# Patient Record
Sex: Female | Born: 1965 | Race: White | Hispanic: No | Smoking: Former smoker
Health system: Southern US, Community
[De-identification: ages and names within clinical notes are randomized; demographics above are authoritative.]

## PROBLEM LIST (undated history)

## (undated) DIAGNOSIS — M797 Fibromyalgia: Secondary | ICD-10-CM

## (undated) DIAGNOSIS — R519 Headache, unspecified: Secondary | ICD-10-CM

## (undated) DIAGNOSIS — K859 Acute pancreatitis without necrosis or infection, unspecified: Secondary | ICD-10-CM

## (undated) DIAGNOSIS — R0600 Dyspnea, unspecified: Secondary | ICD-10-CM

## (undated) DIAGNOSIS — K224 Dyskinesia of esophagus: Secondary | ICD-10-CM

## (undated) DIAGNOSIS — Z9889 Other specified postprocedural states: Secondary | ICD-10-CM

## (undated) DIAGNOSIS — R000 Tachycardia, unspecified: Secondary | ICD-10-CM

## (undated) DIAGNOSIS — G610 Guillain-Barre syndrome: Secondary | ICD-10-CM

## (undated) DIAGNOSIS — E119 Type 2 diabetes mellitus without complications: Secondary | ICD-10-CM

## (undated) DIAGNOSIS — R112 Nausea with vomiting, unspecified: Secondary | ICD-10-CM

## (undated) DIAGNOSIS — J398 Other specified diseases of upper respiratory tract: Secondary | ICD-10-CM

## (undated) DIAGNOSIS — G4730 Sleep apnea, unspecified: Secondary | ICD-10-CM

## (undated) DIAGNOSIS — T7840XA Allergy, unspecified, initial encounter: Secondary | ICD-10-CM

## (undated) DIAGNOSIS — R911 Solitary pulmonary nodule: Secondary | ICD-10-CM

## (undated) DIAGNOSIS — K219 Gastro-esophageal reflux disease without esophagitis: Secondary | ICD-10-CM

## (undated) DIAGNOSIS — E785 Hyperlipidemia, unspecified: Secondary | ICD-10-CM

## (undated) DIAGNOSIS — I10 Essential (primary) hypertension: Secondary | ICD-10-CM

## (undated) DIAGNOSIS — E039 Hypothyroidism, unspecified: Secondary | ICD-10-CM

## (undated) DIAGNOSIS — J449 Chronic obstructive pulmonary disease, unspecified: Secondary | ICD-10-CM

## (undated) DIAGNOSIS — G2581 Restless legs syndrome: Secondary | ICD-10-CM

## (undated) DIAGNOSIS — J45909 Unspecified asthma, uncomplicated: Secondary | ICD-10-CM

## (undated) DIAGNOSIS — Q8501 Neurofibromatosis, type 1: Secondary | ICD-10-CM

## (undated) HISTORY — DX: Gastro-esophageal reflux disease without esophagitis: K21.9

## (undated) HISTORY — DX: Neurofibromatosis, type 1: Q85.01

## (undated) HISTORY — DX: Essential (primary) hypertension: I10

## (undated) HISTORY — DX: Allergy, unspecified, initial encounter: T78.40XA

## (undated) HISTORY — PX: THYROID SURGERY: SHX805

## (undated) HISTORY — DX: Hyperlipidemia, unspecified: E78.5

## (undated) HISTORY — PX: ABDOMINAL HYSTERECTOMY: SHX81

## (undated) HISTORY — DX: Dyskinesia of esophagus: K22.4

## (undated) HISTORY — DX: Hypothyroidism, unspecified: E03.9

## (undated) HISTORY — PX: COLON SURGERY: SHX602

## (undated) HISTORY — PX: CHOLECYSTECTOMY: SHX55

## (undated) HISTORY — PX: APPENDECTOMY: SHX54

## (undated) HISTORY — PX: TUBAL LIGATION: SHX77

## (undated) HISTORY — PX: SMALL INTESTINE SURGERY: SHX150

## (undated) HISTORY — PX: FOOT SURGERY: SHX648

---
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
## 1998-04-23 DIAGNOSIS — G43909 Migraine, unspecified, not intractable, without status migrainosus: Secondary | ICD-10-CM | POA: Insufficient documentation

## 2000-08-21 ENCOUNTER — Ambulatory Visit (HOSPITAL_COMMUNITY): Admission: RE | Admit: 2000-08-21 | Discharge: 2000-08-21 | Payer: Self-pay | Admitting: Internal Medicine

## 2000-08-21 ENCOUNTER — Encounter: Payer: Self-pay | Admitting: Internal Medicine

## 2002-04-23 DIAGNOSIS — F1120 Opioid dependence, uncomplicated: Secondary | ICD-10-CM | POA: Insufficient documentation

## 2002-05-01 ENCOUNTER — Encounter: Admission: RE | Admit: 2002-05-01 | Discharge: 2002-07-30 | Payer: Self-pay

## 2002-11-12 ENCOUNTER — Encounter
Admission: RE | Admit: 2002-11-12 | Discharge: 2003-02-10 | Payer: Self-pay | Admitting: Physical Medicine & Rehabilitation

## 2003-02-25 ENCOUNTER — Encounter: Admission: RE | Admit: 2003-02-25 | Discharge: 2003-05-26 | Payer: Self-pay | Admitting: Chiropractic Medicine

## 2003-03-04 ENCOUNTER — Ambulatory Visit (HOSPITAL_COMMUNITY)
Admission: RE | Admit: 2003-03-04 | Discharge: 2003-03-04 | Payer: Self-pay | Admitting: Physical Medicine & Rehabilitation

## 2004-05-10 ENCOUNTER — Ambulatory Visit: Payer: Self-pay | Admitting: Family Medicine

## 2004-08-03 ENCOUNTER — Inpatient Hospital Stay: Payer: Self-pay | Admitting: Internal Medicine

## 2004-09-14 ENCOUNTER — Ambulatory Visit: Payer: Self-pay | Admitting: Internal Medicine

## 2005-02-26 ENCOUNTER — Encounter: Admission: RE | Admit: 2005-02-26 | Discharge: 2005-02-26 | Payer: Self-pay | Admitting: Family Medicine

## 2005-08-01 ENCOUNTER — Ambulatory Visit: Payer: Self-pay | Admitting: Unknown Physician Specialty

## 2005-11-06 DIAGNOSIS — E162 Hypoglycemia, unspecified: Secondary | ICD-10-CM | POA: Insufficient documentation

## 2005-12-26 ENCOUNTER — Emergency Department: Payer: Self-pay | Admitting: Emergency Medicine

## 2006-06-07 ENCOUNTER — Ambulatory Visit: Payer: Self-pay | Admitting: Family Medicine

## 2006-11-27 ENCOUNTER — Ambulatory Visit: Payer: Self-pay | Admitting: Family Medicine

## 2006-11-27 DIAGNOSIS — R1013 Epigastric pain: Secondary | ICD-10-CM | POA: Insufficient documentation

## 2006-12-31 ENCOUNTER — Ambulatory Visit: Payer: Self-pay | Admitting: Unknown Physician Specialty

## 2007-03-17 DIAGNOSIS — M545 Low back pain, unspecified: Secondary | ICD-10-CM | POA: Insufficient documentation

## 2007-06-03 DIAGNOSIS — R071 Chest pain on breathing: Secondary | ICD-10-CM | POA: Insufficient documentation

## 2007-08-01 DIAGNOSIS — H00039 Abscess of eyelid unspecified eye, unspecified eyelid: Secondary | ICD-10-CM | POA: Insufficient documentation

## 2007-08-06 ENCOUNTER — Ambulatory Visit: Payer: Self-pay | Admitting: Family Medicine

## 2008-01-01 ENCOUNTER — Ambulatory Visit: Payer: Self-pay | Admitting: Unknown Physician Specialty

## 2008-05-16 ENCOUNTER — Emergency Department: Payer: Self-pay | Admitting: Emergency Medicine

## 2008-06-03 ENCOUNTER — Ambulatory Visit: Payer: Self-pay | Admitting: Specialist

## 2008-06-23 ENCOUNTER — Ambulatory Visit: Payer: Self-pay | Admitting: Specialist

## 2008-10-09 DIAGNOSIS — F430 Acute stress reaction: Secondary | ICD-10-CM | POA: Insufficient documentation

## 2009-02-07 ENCOUNTER — Ambulatory Visit: Payer: Self-pay | Admitting: Specialist

## 2009-02-07 DIAGNOSIS — G4700 Insomnia, unspecified: Secondary | ICD-10-CM | POA: Insufficient documentation

## 2009-03-22 ENCOUNTER — Ambulatory Visit: Payer: Self-pay | Admitting: Unknown Physician Specialty

## 2009-05-10 DIAGNOSIS — R000 Tachycardia, unspecified: Secondary | ICD-10-CM | POA: Insufficient documentation

## 2009-05-27 DIAGNOSIS — D34 Benign neoplasm of thyroid gland: Secondary | ICD-10-CM | POA: Insufficient documentation

## 2009-06-03 ENCOUNTER — Emergency Department: Payer: Self-pay | Admitting: Internal Medicine

## 2009-06-30 DIAGNOSIS — M2550 Pain in unspecified joint: Secondary | ICD-10-CM | POA: Insufficient documentation

## 2009-07-12 DIAGNOSIS — F32A Depression, unspecified: Secondary | ICD-10-CM | POA: Insufficient documentation

## 2009-10-10 DIAGNOSIS — S335XXA Sprain of ligaments of lumbar spine, initial encounter: Secondary | ICD-10-CM | POA: Insufficient documentation

## 2009-11-21 DIAGNOSIS — N952 Postmenopausal atrophic vaginitis: Secondary | ICD-10-CM | POA: Insufficient documentation

## 2009-11-24 DIAGNOSIS — R059 Cough, unspecified: Secondary | ICD-10-CM | POA: Insufficient documentation

## 2009-11-24 DIAGNOSIS — R053 Chronic cough: Secondary | ICD-10-CM | POA: Insufficient documentation

## 2010-01-23 DIAGNOSIS — R1110 Vomiting, unspecified: Secondary | ICD-10-CM | POA: Insufficient documentation

## 2010-02-21 ENCOUNTER — Ambulatory Visit: Payer: Self-pay | Admitting: General Surgery

## 2010-04-23 HISTORY — PX: BREAST BIOPSY: SHX20

## 2010-05-01 ENCOUNTER — Ambulatory Visit: Payer: Self-pay | Admitting: Internal Medicine

## 2010-05-29 ENCOUNTER — Ambulatory Visit: Payer: Self-pay | Admitting: Family Medicine

## 2010-06-28 ENCOUNTER — Ambulatory Visit: Payer: Self-pay

## 2010-07-13 ENCOUNTER — Ambulatory Visit: Payer: Self-pay | Admitting: Surgery

## 2010-07-17 ENCOUNTER — Ambulatory Visit: Payer: Self-pay | Admitting: Surgery

## 2010-07-18 LAB — PATHOLOGY REPORT

## 2010-08-12 ENCOUNTER — Emergency Department: Payer: Self-pay | Admitting: Emergency Medicine

## 2010-08-17 DIAGNOSIS — R091 Pleurisy: Secondary | ICD-10-CM | POA: Insufficient documentation

## 2010-09-25 ENCOUNTER — Ambulatory Visit: Payer: Self-pay | Admitting: Family Medicine

## 2011-06-07 ENCOUNTER — Ambulatory Visit: Payer: Self-pay | Admitting: Pain Medicine

## 2011-07-09 ENCOUNTER — Ambulatory Visit: Payer: Self-pay | Admitting: Pain Medicine

## 2011-07-19 ENCOUNTER — Ambulatory Visit: Payer: Self-pay | Admitting: Pain Medicine

## 2011-07-25 ENCOUNTER — Ambulatory Visit: Payer: Self-pay | Admitting: Pain Medicine

## 2011-08-28 ENCOUNTER — Ambulatory Visit: Payer: Self-pay | Admitting: Pain Medicine

## 2011-09-05 ENCOUNTER — Ambulatory Visit: Payer: Self-pay | Admitting: Pain Medicine

## 2011-09-27 ENCOUNTER — Ambulatory Visit: Payer: Self-pay | Admitting: Pain Medicine

## 2011-10-02 ENCOUNTER — Ambulatory Visit: Payer: Self-pay | Admitting: Family Medicine

## 2011-10-04 ENCOUNTER — Ambulatory Visit: Payer: Self-pay | Admitting: Family Medicine

## 2011-10-08 ENCOUNTER — Ambulatory Visit: Payer: Self-pay | Admitting: Pain Medicine

## 2011-11-13 ENCOUNTER — Ambulatory Visit: Payer: Self-pay | Admitting: Pain Medicine

## 2012-01-01 ENCOUNTER — Ambulatory Visit: Payer: Self-pay | Admitting: Pain Medicine

## 2012-01-09 ENCOUNTER — Ambulatory Visit: Payer: Self-pay | Admitting: Pain Medicine

## 2012-01-29 ENCOUNTER — Ambulatory Visit: Payer: Self-pay | Admitting: Pain Medicine

## 2012-02-13 ENCOUNTER — Ambulatory Visit: Payer: Self-pay | Admitting: Pain Medicine

## 2012-02-13 ENCOUNTER — Ambulatory Visit: Payer: Self-pay | Admitting: Family Medicine

## 2012-02-26 ENCOUNTER — Ambulatory Visit: Payer: Self-pay | Admitting: Pain Medicine

## 2012-03-10 ENCOUNTER — Other Ambulatory Visit: Payer: Self-pay | Admitting: Family Medicine

## 2012-03-10 LAB — CBC WITH DIFFERENTIAL/PLATELET
Basophil #: 0.1 10*3/uL (ref 0.0–0.1)
Basophil %: 0.7 %
Eosinophil #: 0.1 10*3/uL (ref 0.0–0.7)
Eosinophil %: 0.5 %
HCT: 48.8 % — ABNORMAL HIGH (ref 35.0–47.0)
HGB: 16.9 g/dL — ABNORMAL HIGH (ref 12.0–16.0)
Lymphocyte #: 1.8 10*3/uL (ref 1.0–3.6)
Lymphocyte %: 17.6 %
MCH: 29 pg (ref 26.0–34.0)
MCHC: 34.6 g/dL (ref 32.0–36.0)
MCV: 84 fL (ref 80–100)
Monocyte #: 0.9 x10 3/mm (ref 0.2–0.9)
Monocyte %: 9 %
Neutrophil #: 7.3 10*3/uL — ABNORMAL HIGH (ref 1.4–6.5)
Neutrophil %: 72.2 %
Platelet: 363 10*3/uL (ref 150–440)
RBC: 5.82 10*6/uL — ABNORMAL HIGH (ref 3.80–5.20)
RDW: 13.7 % (ref 11.5–14.5)
WBC: 10.1 10*3/uL (ref 3.6–11.0)

## 2012-03-10 LAB — COMPREHENSIVE METABOLIC PANEL
Albumin: 4.3 g/dL (ref 3.4–5.0)
Alkaline Phosphatase: 107 U/L (ref 50–136)
Anion Gap: 9 (ref 7–16)
BUN: 15 mg/dL (ref 7–18)
Bilirubin,Total: 0.7 mg/dL (ref 0.2–1.0)
Calcium, Total: 10 mg/dL (ref 8.5–10.1)
Chloride: 97 mmol/L — ABNORMAL LOW (ref 98–107)
Co2: 28 mmol/L (ref 21–32)
Creatinine: 0.91 mg/dL (ref 0.60–1.30)
EGFR (African American): >60
EGFR (Non-African Amer.): >60
Glucose: 98 mg/dL (ref 65–99)
Osmolality: 269 (ref 275–301)
Potassium: 3.5 mmol/L (ref 3.5–5.1)
SGOT(AST): 16 U/L (ref 15–37)
SGPT (ALT): 21 U/L (ref 12–78)
Sodium: 134 mmol/L — ABNORMAL LOW (ref 136–145)
Total Protein: 8.4 g/dL — ABNORMAL HIGH (ref 6.4–8.2)

## 2012-03-24 ENCOUNTER — Ambulatory Visit: Payer: Self-pay | Admitting: Pain Medicine

## 2012-05-08 ENCOUNTER — Ambulatory Visit: Payer: Self-pay | Admitting: Pain Medicine

## 2012-05-15 ENCOUNTER — Other Ambulatory Visit: Payer: Self-pay | Admitting: Family Medicine

## 2012-05-15 LAB — CBC WITH DIFFERENTIAL/PLATELET
Basophil #: 0.1 10*3/uL (ref 0.0–0.1)
Basophil %: 0.9 %
Eosinophil #: 0.1 10*3/uL (ref 0.0–0.7)
Eosinophil %: 0.8 %
HCT: 46.1 % (ref 35.0–47.0)
HGB: 15.7 g/dL (ref 12.0–16.0)
Lymphocyte #: 1.1 10*3/uL (ref 1.0–3.6)
Lymphocyte %: 11.2 %
MCH: 28.9 pg (ref 26.0–34.0)
MCHC: 34.1 g/dL (ref 32.0–36.0)
MCV: 85 fL (ref 80–100)
Monocyte #: 0.9 x10 3/mm (ref 0.2–0.9)
Monocyte %: 8.8 %
Neutrophil #: 8 10*3/uL — ABNORMAL HIGH (ref 1.4–6.5)
Neutrophil %: 78.3 %
Platelet: 450 10*3/uL — ABNORMAL HIGH (ref 150–440)
RBC: 5.45 10*6/uL — ABNORMAL HIGH (ref 3.80–5.20)
RDW: 14.4 % (ref 11.5–14.5)
WBC: 10.2 10*3/uL (ref 3.6–11.0)

## 2012-05-15 LAB — COMPREHENSIVE METABOLIC PANEL
Albumin: 3.9 g/dL (ref 3.4–5.0)
Alkaline Phosphatase: 151 U/L — ABNORMAL HIGH (ref 50–136)
Anion Gap: 10 (ref 7–16)
BUN: 25 mg/dL — ABNORMAL HIGH (ref 7–18)
Bilirubin,Total: 0.5 mg/dL (ref 0.2–1.0)
Calcium, Total: 9.5 mg/dL (ref 8.5–10.1)
Chloride: 96 mmol/L — ABNORMAL LOW (ref 98–107)
Co2: 27 mmol/L (ref 21–32)
Creatinine: 0.82 mg/dL (ref 0.60–1.30)
EGFR (African American): >60
EGFR (Non-African Amer.): >60
Glucose: 100 mg/dL — ABNORMAL HIGH (ref 65–99)
Osmolality: 271 (ref 275–301)
Potassium: 3.3 mmol/L — ABNORMAL LOW (ref 3.5–5.1)
SGOT(AST): 15 U/L (ref 15–37)
SGPT (ALT): 25 U/L (ref 12–78)
Sodium: 133 mmol/L — ABNORMAL LOW (ref 136–145)
Total Protein: 8.8 g/dL — ABNORMAL HIGH (ref 6.4–8.2)

## 2012-05-15 LAB — LIPASE, BLOOD: Lipase: 84 U/L (ref 73–393)

## 2012-05-28 ENCOUNTER — Ambulatory Visit: Payer: Self-pay | Admitting: Pain Medicine

## 2012-06-03 ENCOUNTER — Ambulatory Visit: Payer: Self-pay | Admitting: Pain Medicine

## 2012-07-02 ENCOUNTER — Ambulatory Visit: Payer: Self-pay | Admitting: Pain Medicine

## 2012-07-21 ENCOUNTER — Ambulatory Visit: Payer: Self-pay | Admitting: Pain Medicine

## 2012-07-24 ENCOUNTER — Ambulatory Visit: Payer: Self-pay | Admitting: Pain Medicine

## 2012-07-30 ENCOUNTER — Ambulatory Visit: Payer: Self-pay | Admitting: Pain Medicine

## 2012-08-12 ENCOUNTER — Ambulatory Visit: Payer: Self-pay | Admitting: Pain Medicine

## 2012-08-27 ENCOUNTER — Ambulatory Visit: Payer: Self-pay | Admitting: Pain Medicine

## 2012-09-11 ENCOUNTER — Ambulatory Visit: Payer: Self-pay | Admitting: Pain Medicine

## 2012-10-07 ENCOUNTER — Ambulatory Visit: Payer: Self-pay | Admitting: Pain Medicine

## 2012-10-17 ENCOUNTER — Ambulatory Visit: Payer: Self-pay | Admitting: Family Medicine

## 2012-11-06 ENCOUNTER — Ambulatory Visit: Payer: Self-pay | Admitting: Pain Medicine

## 2013-03-01 LAB — URINALYSIS, COMPLETE
Bacteria: NONE SEEN
Bilirubin,UR: NEGATIVE
Blood: NEGATIVE
Glucose,UR: NEGATIVE mg/dL (ref 0–75)
Hyaline Cast: 3
Ketone: NEGATIVE
Nitrite: NEGATIVE
Ph: 5 (ref 4.5–8.0)
Protein: NEGATIVE
RBC,UR: 4 /HPF (ref 0–5)
Specific Gravity: 1.025 (ref 1.003–1.030)
Squamous Epithelial: 1
WBC UR: 19 /HPF (ref 0–5)

## 2013-03-01 LAB — CBC
HCT: 42.9 % (ref 35.0–47.0)
HGB: 14.8 g/dL (ref 12.0–16.0)
MCH: 28.7 pg (ref 26.0–34.0)
MCHC: 34.5 g/dL (ref 32.0–36.0)
MCV: 83 fL (ref 80–100)
Platelet: 281 10*3/uL (ref 150–440)
RBC: 5.15 10*6/uL (ref 3.80–5.20)
RDW: 15.1 % — ABNORMAL HIGH (ref 11.5–14.5)
WBC: 9.9 10*3/uL (ref 3.6–11.0)

## 2013-03-01 LAB — COMPREHENSIVE METABOLIC PANEL
Albumin: 3.9 g/dL (ref 3.4–5.0)
Alkaline Phosphatase: 112 U/L (ref 50–136)
Anion Gap: 7 (ref 7–16)
BUN: 23 mg/dL — ABNORMAL HIGH (ref 7–18)
Bilirubin,Total: 0.3 mg/dL (ref 0.2–1.0)
Calcium, Total: 9.2 mg/dL (ref 8.5–10.1)
Chloride: 110 mmol/L — ABNORMAL HIGH (ref 98–107)
Co2: 24 mmol/L (ref 21–32)
Creatinine: 1 mg/dL (ref 0.60–1.30)
EGFR (African American): >60
EGFR (Non-African Amer.): >60
Glucose: 111 mg/dL — ABNORMAL HIGH (ref 65–99)
Osmolality: 286 (ref 275–301)
Potassium: 3.7 mmol/L (ref 3.5–5.1)
SGOT(AST): 21 U/L (ref 15–37)
SGPT (ALT): 21 U/L (ref 12–78)
Sodium: 141 mmol/L (ref 136–145)
Total Protein: 7.3 g/dL (ref 6.4–8.2)

## 2013-03-01 LAB — DRUG SCREEN, URINE

## 2013-03-01 LAB — TSH: Thyroid Stimulating Horm: 68.4 u[IU]/mL — ABNORMAL HIGH

## 2013-03-01 LAB — ETHANOL
Ethanol %: <0.003 % (ref 0.000–0.080)
Ethanol: <3 mg/dL

## 2013-03-02 ENCOUNTER — Inpatient Hospital Stay: Payer: Self-pay | Admitting: Psychiatry

## 2013-03-09 ENCOUNTER — Ambulatory Visit: Payer: Self-pay | Admitting: Physician Assistant

## 2013-03-09 ENCOUNTER — Ambulatory Visit: Payer: Self-pay | Admitting: Psychiatry

## 2013-03-16 ENCOUNTER — Emergency Department: Payer: Self-pay | Admitting: Emergency Medicine

## 2013-03-23 ENCOUNTER — Ambulatory Visit: Payer: Self-pay | Admitting: Psychiatry

## 2013-04-10 ENCOUNTER — Emergency Department: Payer: Self-pay | Admitting: Emergency Medicine

## 2013-04-10 LAB — CBC
HCT: 46.9 % (ref 35.0–47.0)
HGB: 15.7 g/dL (ref 12.0–16.0)
MCH: 28.4 pg (ref 26.0–34.0)
MCHC: 33.4 g/dL (ref 32.0–36.0)
MCV: 85 fL (ref 80–100)
Platelet: 304 10*3/uL (ref 150–440)
RBC: 5.51 10*6/uL — ABNORMAL HIGH (ref 3.80–5.20)
RDW: 14.6 % — ABNORMAL HIGH (ref 11.5–14.5)
WBC: 11.4 10*3/uL — ABNORMAL HIGH (ref 3.6–11.0)

## 2013-04-10 LAB — BASIC METABOLIC PANEL
Anion Gap: 4 — ABNORMAL LOW (ref 7–16)
BUN: 17 mg/dL (ref 7–18)
Calcium, Total: 9.5 mg/dL (ref 8.5–10.1)
Chloride: 104 mmol/L (ref 98–107)
Co2: 28 mmol/L (ref 21–32)
Creatinine: 0.86 mg/dL (ref 0.60–1.30)
EGFR (African American): >60
EGFR (Non-African Amer.): >60
Glucose: 113 mg/dL — ABNORMAL HIGH (ref 65–99)
Osmolality: 274 (ref 275–301)
Potassium: 4 mmol/L (ref 3.5–5.1)
Sodium: 136 mmol/L (ref 136–145)

## 2013-04-10 LAB — TROPONIN I: Troponin-I: <0.02 ng/mL

## 2013-04-23 ENCOUNTER — Ambulatory Visit: Payer: Self-pay | Admitting: Psychiatry

## 2013-04-27 ENCOUNTER — Inpatient Hospital Stay: Payer: Self-pay | Admitting: Psychiatry

## 2013-04-27 LAB — URINALYSIS, COMPLETE
Bilirubin,UR: NEGATIVE
Blood: NEGATIVE
Glucose,UR: NEGATIVE mg/dL (ref 0–75)
Ketone: NEGATIVE
Nitrite: NEGATIVE
Ph: 6 (ref 4.5–8.0)
Protein: NEGATIVE
RBC,UR: 1 /HPF (ref 0–5)
Specific Gravity: 1.025 (ref 1.003–1.030)
Squamous Epithelial: 4
WBC UR: 7 /HPF (ref 0–5)

## 2013-04-27 LAB — DRUG SCREEN, URINE

## 2013-04-27 LAB — CBC
HCT: 45.1 % (ref 35.0–47.0)
HGB: 15 g/dL (ref 12.0–16.0)
MCH: 28.1 pg (ref 26.0–34.0)
MCHC: 33.3 g/dL (ref 32.0–36.0)
MCV: 84 fL (ref 80–100)
Platelet: 290 10*3/uL (ref 150–440)
RBC: 5.34 10*6/uL — ABNORMAL HIGH (ref 3.80–5.20)
RDW: 14.2 % (ref 11.5–14.5)
WBC: 9.1 10*3/uL (ref 3.6–11.0)

## 2013-04-27 LAB — COMPREHENSIVE METABOLIC PANEL
Albumin: 3.8 g/dL (ref 3.4–5.0)
Alkaline Phosphatase: 110 U/L
Anion Gap: 5 — ABNORMAL LOW (ref 7–16)
BUN: 19 mg/dL — ABNORMAL HIGH (ref 7–18)
Bilirubin,Total: 0.4 mg/dL (ref 0.2–1.0)
Calcium, Total: 9.2 mg/dL (ref 8.5–10.1)
Chloride: 110 mmol/L — ABNORMAL HIGH (ref 98–107)
Co2: 25 mmol/L (ref 21–32)
Creatinine: 0.93 mg/dL (ref 0.60–1.30)
EGFR (African American): >60
EGFR (Non-African Amer.): >60
Glucose: 95 mg/dL (ref 65–99)
Osmolality: 281 (ref 275–301)
Potassium: 3.9 mmol/L (ref 3.5–5.1)
SGOT(AST): 20 U/L (ref 15–37)
SGPT (ALT): 36 U/L (ref 12–78)
Sodium: 140 mmol/L (ref 136–145)
Total Protein: 7.8 g/dL (ref 6.4–8.2)

## 2013-04-27 LAB — ETHANOL
Ethanol %: <0.003 % (ref 0.000–0.080)
Ethanol: <3 mg/dL

## 2013-04-27 LAB — ACETAMINOPHEN LEVEL: Acetaminophen: <2 ug/mL

## 2013-04-27 LAB — SALICYLATE LEVEL: Salicylates, Serum: <1.7 mg/dL

## 2013-04-28 LAB — URINE CULTURE

## 2013-04-30 LAB — TSH: Thyroid Stimulating Horm: 3.97 u[IU]/mL

## 2013-08-21 ENCOUNTER — Ambulatory Visit: Payer: Self-pay | Admitting: Internal Medicine

## 2013-09-15 DIAGNOSIS — D171 Benign lipomatous neoplasm of skin and subcutaneous tissue of trunk: Secondary | ICD-10-CM | POA: Insufficient documentation

## 2013-09-29 ENCOUNTER — Ambulatory Visit: Payer: Self-pay

## 2013-11-04 ENCOUNTER — Ambulatory Visit: Payer: Self-pay | Admitting: Pain Medicine

## 2014-01-18 ENCOUNTER — Ambulatory Visit: Payer: Self-pay | Admitting: Pain Medicine

## 2014-01-24 ENCOUNTER — Emergency Department: Payer: Self-pay | Admitting: Emergency Medicine

## 2014-01-24 LAB — CBC
HCT: 43.7 % (ref 35.0–47.0)
HGB: 14.1 g/dL (ref 12.0–16.0)
MCH: 26.9 pg (ref 26.0–34.0)
MCHC: 32.3 g/dL (ref 32.0–36.0)
MCV: 83 fL (ref 80–100)
Platelet: 211 10*3/uL (ref 150–440)
RBC: 5.25 10*6/uL — ABNORMAL HIGH (ref 3.80–5.20)
RDW: 13.5 % (ref 11.5–14.5)
WBC: 9 10*3/uL (ref 3.6–11.0)

## 2014-01-24 LAB — BASIC METABOLIC PANEL
Anion Gap: 7 (ref 7–16)
BUN: 14 mg/dL (ref 7–18)
Calcium, Total: 8.2 mg/dL — ABNORMAL LOW (ref 8.5–10.1)
Chloride: 108 mmol/L — ABNORMAL HIGH (ref 98–107)
Co2: 23 mmol/L (ref 21–32)
Creatinine: 1.03 mg/dL (ref 0.60–1.30)
EGFR (African American): >60
EGFR (Non-African Amer.): >60
Glucose: 101 mg/dL — ABNORMAL HIGH (ref 65–99)
Osmolality: 276 (ref 275–301)
Potassium: 3.3 mmol/L — ABNORMAL LOW (ref 3.5–5.1)
Sodium: 138 mmol/L (ref 136–145)

## 2014-01-30 ENCOUNTER — Emergency Department: Payer: Self-pay | Admitting: Emergency Medicine

## 2014-01-30 LAB — COMPREHENSIVE METABOLIC PANEL
Albumin: 3.4 g/dL (ref 3.4–5.0)
Alkaline Phosphatase: 137 U/L — ABNORMAL HIGH
Anion Gap: 8 (ref 7–16)
BUN: 16 mg/dL (ref 7–18)
Bilirubin,Total: 0.4 mg/dL (ref 0.2–1.0)
Calcium, Total: 8.2 mg/dL — ABNORMAL LOW (ref 8.5–10.1)
Chloride: 112 mmol/L — ABNORMAL HIGH (ref 98–107)
Co2: 21 mmol/L (ref 21–32)
Creatinine: 0.83 mg/dL (ref 0.60–1.30)
EGFR (African American): >60
EGFR (Non-African Amer.): >60
Glucose: 97 mg/dL (ref 65–99)
Osmolality: 282 (ref 275–301)
Potassium: 3.9 mmol/L (ref 3.5–5.1)
SGOT(AST): 28 U/L (ref 15–37)
SGPT (ALT): 44 U/L
Sodium: 141 mmol/L (ref 136–145)
Total Protein: 7.2 g/dL (ref 6.4–8.2)

## 2014-01-30 LAB — CBC
HCT: 45.2 % (ref 35.0–47.0)
HGB: 14.5 g/dL (ref 12.0–16.0)
MCH: 26.5 pg (ref 26.0–34.0)
MCHC: 32.1 g/dL (ref 32.0–36.0)
MCV: 83 fL (ref 80–100)
Platelet: 312 10*3/uL (ref 150–440)
RBC: 5.47 10*6/uL — ABNORMAL HIGH (ref 3.80–5.20)
RDW: 13.5 % (ref 11.5–14.5)
WBC: 8.8 10*3/uL (ref 3.6–11.0)

## 2014-01-30 LAB — TROPONIN I: Troponin-I: <0.02 ng/mL

## 2014-03-28 ENCOUNTER — Emergency Department: Payer: Self-pay | Admitting: Emergency Medicine

## 2014-03-28 LAB — URINALYSIS, COMPLETE
Bilirubin,UR: NEGATIVE
Blood: NEGATIVE
Glucose,UR: NEGATIVE mg/dL (ref 0–75)
Ketone: NEGATIVE
Nitrite: NEGATIVE
Ph: 5 (ref 4.5–8.0)
Protein: NEGATIVE
RBC,UR: <1 /HPF (ref 0–5)
Specific Gravity: 1.021 (ref 1.003–1.030)
Squamous Epithelial: 1
WBC UR: 2 /HPF (ref 0–5)

## 2014-03-28 LAB — COMPREHENSIVE METABOLIC PANEL
Albumin: 3.7 g/dL (ref 3.4–5.0)
Alkaline Phosphatase: 187 U/L — ABNORMAL HIGH
Anion Gap: 11 (ref 7–16)
BUN: 15 mg/dL (ref 7–18)
Bilirubin,Total: 0.3 mg/dL (ref 0.2–1.0)
Calcium, Total: 9.1 mg/dL (ref 8.5–10.1)
Chloride: 104 mmol/L (ref 98–107)
Co2: 24 mmol/L (ref 21–32)
Creatinine: 0.95 mg/dL (ref 0.60–1.30)
EGFR (African American): >60
EGFR (Non-African Amer.): >60
Glucose: 105 mg/dL — ABNORMAL HIGH (ref 65–99)
Osmolality: 279 (ref 275–301)
Potassium: 3.5 mmol/L (ref 3.5–5.1)
SGOT(AST): 24 U/L (ref 15–37)
SGPT (ALT): 88 U/L — ABNORMAL HIGH
Sodium: 139 mmol/L (ref 136–145)
Total Protein: 7.9 g/dL (ref 6.4–8.2)

## 2014-03-28 LAB — CBC
HCT: 46.5 % (ref 35.0–47.0)
HGB: 15.4 g/dL (ref 12.0–16.0)
MCH: 27.5 pg (ref 26.0–34.0)
MCHC: 33.1 g/dL (ref 32.0–36.0)
MCV: 83 fL (ref 80–100)
Platelet: 283 10*3/uL (ref 150–440)
RBC: 5.61 10*6/uL — ABNORMAL HIGH (ref 3.80–5.20)
RDW: 14.5 % (ref 11.5–14.5)
WBC: 10.4 10*3/uL (ref 3.6–11.0)

## 2014-03-28 LAB — LIPASE, BLOOD: Lipase: 114 U/L (ref 73–393)

## 2014-04-05 ENCOUNTER — Ambulatory Visit: Payer: Self-pay | Admitting: Pain Medicine

## 2014-06-22 HISTORY — PX: OTHER SURGICAL HISTORY: SHX169

## 2014-07-07 DIAGNOSIS — E039 Hypothyroidism, unspecified: Secondary | ICD-10-CM | POA: Insufficient documentation

## 2014-07-07 DIAGNOSIS — R112 Nausea with vomiting, unspecified: Secondary | ICD-10-CM | POA: Insufficient documentation

## 2014-07-07 DIAGNOSIS — F418 Other specified anxiety disorders: Secondary | ICD-10-CM | POA: Insufficient documentation

## 2014-07-30 DIAGNOSIS — E43 Unspecified severe protein-calorie malnutrition: Secondary | ICD-10-CM | POA: Insufficient documentation
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
## 2014-08-14 NOTE — Discharge Summary (Signed)
PATIENT NAME:  Jenna Tyler, Jenna Tyler MR#:  [id number] DATE OF BIRTH:  1965-11-10  DATE OF ADMISSION:  04/27/2013  DATE OF DISCHARGE:  05/03/2013  HOSPITAL COURSE: See dictated history and physical for details of admission. This 49 year old woman with a history of opiate dependence and mood swings was admitted to the hospital after making some suicidal statements and becoming agitated at home. She had been continuing to abuse medication, and had reached the point of increasing tension at home. In the hospital, the patient was initially very sad, tearful and upset, and somewhat resistant to discussing her substance abuse, but she gradually became more open to it and was able to start to discuss substance abuse treatment. She was able to acknowledge that without changes to her substance abuse behavior, that she was suffering possibly permanent damage to her relationship with her husband and her family. Mood improved during the time she was in the hospital. She was treated with a combination of Lexapro and Remeron primarily for her mood, which she tolerated well. The patient was advised to consider inpatient rehab treatment. Rather than going to the alcohol and drug abuse treatment center, she preferred to go to [facility]. She was accepted there, and made arrangements for voluntary admission. In the hospital, she did not engage in any suicidal behavior. At the time of discharge, her affect was more upbeat. She was optimistic and looking forward to getting sober and taking care of herself in the future. She was treated for urinary tract infection while she was in the hospital, and also continued on her thyroid medicine.   MEDICATION DISCHARGE: Mirtazapine 15 mg at night, escitalopram 20 mg per day, hydroxyzine 50 mg every 6 hours as needed for anxiety, Bactrim DS 1 tablet 2 times a day for another 3 days, levothyroxine 150 mcg once a day.   MENTAL STATUS EXAM AT DISCHARGE: Neatly dressed and groomed woman,  looks her stated age. Cooperative with the interview. Good eye contact. Normal psychomotor activity. Speech normal rate, tone and volume. Affect is smiling, upbeat. Mood is still described as anxious. Thoughts are lucid, without loosening of associations or delusions. Denies auditory or visual hallucinations. Denies suicidal or homicidal ideation. Shows improved judgment and insight. Normal intelligence. Alert and oriented x 4.   LABORATORY RESULTS: TSH was 3.97. Other labs included salicylates and acetaminophen that were negative. Alcohol level negative. Chemistry panel:  Slightly elevated chloride 110. CBC:  Only slightly elevated  red blood cell count of 5.34. Urinalysis:  Positive leukocyte esterase, positive for infection. Drug screen positive for benzodiazepines.   DISPOSITION: Discharge with her family, with a plan for direct transfer to [facility] for inpatient substance abuse treatment.   DIAGNOSIS, PRINCIPAL AND PRIMARY:  AXIS I: Major depression, recurrent, severe.   SECONDARY DIAGNOSES: AXIS I:  1.  Opiate dependence.  2.  Benzodiazepine abuse.  AXIS II: Cluster B personality features.  AXIS III:  1.  Hypothyroidism. 2.  Chronic headaches. 3.  Urinary tract infection.  AXIS IV: Moderate to severe from discord with her spouse and family.  AXIS V: Functioning at time of discharge 55.     ____________________________ Gonzella Lex, MD jtc:mr D: 05/07/2013 15:26:25 ET T: 05/07/2013 19:16:05 ET JOB#: [id number]  cc: Gonzella Lex, MD, <Dictator> Gonzella Lex MD ELECTRONICALLY SIGNED 05/08/2013 11:43
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
## 2014-09-15 ENCOUNTER — Other Ambulatory Visit: Payer: Self-pay | Admitting: Unknown Physician Specialty

## 2014-09-15 DIAGNOSIS — Z1231 Encounter for screening mammogram for malignant neoplasm of breast: Secondary | ICD-10-CM

## 2014-09-21 DIAGNOSIS — D214 Benign neoplasm of connective and other soft tissue of abdomen: Secondary | ICD-10-CM | POA: Insufficient documentation

## 2014-09-22 ENCOUNTER — Other Ambulatory Visit: Payer: Self-pay | Admitting: Psychiatry

## 2014-10-01 ENCOUNTER — Ambulatory Visit: Payer: Self-pay | Attending: Unknown Physician Specialty

## 2014-10-12 ENCOUNTER — Other Ambulatory Visit: Payer: Self-pay

## 2014-10-12 ENCOUNTER — Ambulatory Visit: Payer: Self-pay | Admitting: Psychiatry

## 2014-10-12 DIAGNOSIS — Z8639 Personal history of other endocrine, nutritional and metabolic disease: Secondary | ICD-10-CM | POA: Insufficient documentation

## 2014-10-12 DIAGNOSIS — F1110 Opioid abuse, uncomplicated: Secondary | ICD-10-CM | POA: Insufficient documentation

## 2014-10-12 DIAGNOSIS — F331 Major depressive disorder, recurrent, moderate: Secondary | ICD-10-CM | POA: Insufficient documentation

## 2014-10-12 DIAGNOSIS — G4700 Insomnia, unspecified: Secondary | ICD-10-CM | POA: Insufficient documentation

## 2014-10-12 DIAGNOSIS — F419 Anxiety disorder, unspecified: Secondary | ICD-10-CM | POA: Insufficient documentation

## 2014-10-12 DIAGNOSIS — F411 Generalized anxiety disorder: Secondary | ICD-10-CM | POA: Insufficient documentation

## 2014-10-12 DIAGNOSIS — F1111 Opioid abuse, in remission: Secondary | ICD-10-CM | POA: Insufficient documentation

## 2014-10-14 ENCOUNTER — Ambulatory Visit
Admission: RE | Admit: 2014-10-14 | Discharge: 2014-10-14 | Disposition: A | Discharge status: Home / Self Care | Payer: BLUE CROSS/BLUE SHIELD | Source: Ambulatory Visit | Attending: Unknown Physician Specialty | Admitting: Unknown Physician Specialty

## 2014-10-14 DIAGNOSIS — Z1231 Encounter for screening mammogram for malignant neoplasm of breast: Secondary | ICD-10-CM | POA: Insufficient documentation

## 2014-10-21 ENCOUNTER — Telehealth: Payer: Self-pay | Admitting: Psychiatry

## 2014-10-23 ENCOUNTER — Other Ambulatory Visit: Payer: Self-pay | Admitting: Psychiatry

## 2014-10-28 ENCOUNTER — Ambulatory Visit: Payer: Self-pay | Admitting: Psychiatry

## 2014-10-28 ENCOUNTER — Other Ambulatory Visit: Payer: Self-pay

## 2014-11-18 ENCOUNTER — Ambulatory Visit: Payer: Self-pay | Admitting: Psychiatry

## 2014-11-29 ENCOUNTER — Telehealth: Payer: Self-pay

## 2014-11-29 NOTE — Telephone Encounter (Signed)
pt states that she needs a refill on her effexor 150mg . pt states that she has transforporation issues because he daughter and husband work and she has no car. pt has no on 10-12-14, and canceled 10-28-14, 11-18-14, and 11-30-14 appt.

## 2014-11-30 ENCOUNTER — Ambulatory Visit: Payer: Self-pay | Admitting: Psychiatry

## 2014-11-30 MED ORDER — VENLAFAXINE HCL ER 150 MG PO CP24: 150.0000 mg | ORAL_CAPSULE | Freq: Every morning | ORAL | Status: DC

## 2014-11-30 NOTE — Telephone Encounter (Signed)
letter was mailed certified mail to dismiss from clinic per dr. Gretel Acre orders. pt was sent in a 30 day supply of medication.

## 2014-12-09 ENCOUNTER — Ambulatory Visit: Payer: Self-pay | Admitting: Psychiatry

## 2014-12-20 ENCOUNTER — Telehealth: Payer: Self-pay

## 2014-12-20 NOTE — Telephone Encounter (Signed)
pt called wants to see if you can give her something to help with sleep. pt states that she take otc and it not helping .  pt has appt 12-28-14

## 2014-12-22 ENCOUNTER — Telehealth: Payer: Self-pay

## 2014-12-22 NOTE — Telephone Encounter (Signed)
pt called wanted to find out if you can call her something in for not being able to sleep.

## 2014-12-23 MED ORDER — TRAZODONE HCL 100 MG PO TABS: 100.0000 mg | ORAL_TABLET | Freq: Every evening | ORAL | Status: DC | PRN

## 2014-12-28 ENCOUNTER — Telehealth: Payer: Self-pay | Admitting: Psychiatry

## 2014-12-28 ENCOUNTER — Other Ambulatory Visit: Payer: Self-pay | Admitting: Psychiatry

## 2014-12-28 ENCOUNTER — Ambulatory Visit: Payer: Self-pay | Admitting: Psychiatry

## 2014-12-28 ENCOUNTER — Other Ambulatory Visit: Payer: Self-pay

## 2014-12-28 NOTE — Telephone Encounter (Signed)
12/28/14 4:07pm Patient was dismissed from practice due to not being compliant with appointments. Letter sent 12/01/14 refused 1st notice 12/02/14, 2nd notice 12/07/14, and 3rd notice 12/17/14.

## 2014-12-28 NOTE — Telephone Encounter (Signed)
pt needs refill on her medication to due until her next appt

## 2014-12-28 NOTE — Telephone Encounter (Signed)
pt was notified that she had been dismissed from the office. pt requested a 30 day supply of her medications.  pt states she did not get anything. I confirmed pt address and I told her that according to what the postoffice stamped on the front of the letter notice was left 1st notice on 8-11 , 2nd notice on  8-16 and it was returned on  8-26.

## 2014-12-31 ENCOUNTER — Telehealth: Payer: Self-pay | Admitting: Psychiatry

## 2014-12-31 MED ORDER — BUSPIRONE HCL 30 MG PO TABS: 30.0000 mg | ORAL_TABLET | Freq: Two times a day (BID) | ORAL | Status: DC

## 2014-12-31 MED ORDER — VENLAFAXINE HCL ER 150 MG PO CP24: 150.0000 mg | ORAL_CAPSULE | Freq: Every morning | ORAL | Status: DC

## 2014-12-31 NOTE — Telephone Encounter (Signed)
pt was notified that dr. Jimmye Norman will supply a 7 day supply until dr. Gretel Acre comes back and she can supply the remaining refills

## 2014-12-31 NOTE — Telephone Encounter (Signed)
pt called she is completely out of medication.  (can you give patient enough medications to do until dr. Gretel Acre comes back on tueday)

## 2014-12-31 NOTE — Telephone Encounter (Signed)
Patient is out of medication. Per CMA she as been notified of her discharge from this clinic. However per policy will provide some medication. Will provide her with 4 days worth of medication at this time and then Dr. Gretel Acre, her regular psychiatrist and write for the remaining supply peer discharge policy. AW

## 2015-01-06 NOTE — Telephone Encounter (Signed)
left message that per dr. Gretel Acre she already gave 30 day supply in aug. 1 was when cert letter was received. and she refill all you medications in aug.  pt was left message that she would need to see her PCP, her new psychiatric dr. or go to er.

## 2015-01-06 NOTE — Telephone Encounter (Signed)
Pt has been d/c from practice and was given 30 days supply in August.

## 2015-01-27 ENCOUNTER — Ambulatory Visit: Payer: Self-pay | Admitting: Psychiatry

## 2015-02-14 ENCOUNTER — Other Ambulatory Visit: Payer: Self-pay | Admitting: Psychiatry

## 2015-03-10 ENCOUNTER — Other Ambulatory Visit: Payer: Self-pay | Admitting: "Endocrinology

## 2015-03-10 DIAGNOSIS — E559 Vitamin D deficiency, unspecified: Secondary | ICD-10-CM

## 2015-03-10 DIAGNOSIS — E039 Hypothyroidism, unspecified: Secondary | ICD-10-CM

## 2015-03-14 ENCOUNTER — Ambulatory Visit: Payer: Self-pay | Admitting: "Endocrinology

## 2015-04-04 ENCOUNTER — Ambulatory Visit (INDEPENDENT_AMBULATORY_CARE_PROVIDER_SITE_OTHER): Payer: BLUE CROSS/BLUE SHIELD | Admitting: "Endocrinology

## 2015-04-04 ENCOUNTER — Encounter: Payer: Self-pay | Admitting: "Endocrinology

## 2015-04-04 VITALS — BP 120/77 | HR 92 | Ht 67.0 in | Wt 183.0 lb

## 2015-04-04 DIAGNOSIS — E559 Vitamin D deficiency, unspecified: Secondary | ICD-10-CM | POA: Insufficient documentation

## 2015-04-04 DIAGNOSIS — E785 Hyperlipidemia, unspecified: Secondary | ICD-10-CM | POA: Insufficient documentation

## 2015-04-04 DIAGNOSIS — R7303 Prediabetes: Secondary | ICD-10-CM | POA: Insufficient documentation

## 2015-04-04 DIAGNOSIS — E782 Mixed hyperlipidemia: Secondary | ICD-10-CM | POA: Insufficient documentation

## 2015-04-04 DIAGNOSIS — E039 Hypothyroidism, unspecified: Secondary | ICD-10-CM | POA: Diagnosis not present

## 2015-04-04 MED ORDER — LEVOTHYROXINE SODIUM 150 MCG PO TABS: 150.0000 ug | ORAL_TABLET | Freq: Every day | ORAL | Status: DC

## 2015-04-04 NOTE — Patient Instructions (Signed)
She will do the 24 hour urine calcium now.

## 2015-04-04 NOTE — Progress Notes (Signed)
Subjective:    Patient ID: Jenna Tyler, female    DOB: Aug 31, 1965, PCP Inc [facility]   Past Medical History  Diagnosis Date  . Hypothyroidism   . Hyperlipidemia    Past Surgical History  Procedure Laterality Date  . Breast biopsy Right 2012    negative  . Thyroid surgery      thyroidectomy  . Other surgical history      excision of lipoma  . Other surgical history      Abdominal surgery   Social History   Social History  . Marital Status: Married    Spouse Name: N/A  . Number of Children: N/A  . Years of Education: N/A   Social History Main Topics  . Smoking status: Former Research scientist (life sciences)  . Smokeless tobacco: None  . Alcohol Use: No  . Drug Use: No  . Sexual Activity: Not Asked   Other Topics Concern  . None   Social History Narrative   Outpatient Encounter Prescriptions as of 04/04/2015  Medication Sig  . busPIRone (BUSPAR) 30 MG tablet Take 1 tablet (30 mg total) by mouth 2 (two) times daily.  Marland Kitchen levothyroxine (SYNTHROID, LEVOTHROID) 150 MCG tablet Take 1 tablet (150 mcg total) by mouth daily before breakfast.  . venlafaxine XR (EFFEXOR-XR) 75 MG 24 hr capsule Take 75 mg by mouth daily with breakfast.  . Vitamin D, Ergocalciferol, (DRISDOL) 50000 UNITS CAPS capsule Take by mouth.  . [DISCONTINUED] levothyroxine (SYNTHROID, LEVOTHROID) 150 MCG tablet Take 150 mcg by mouth daily before breakfast.  . [DISCONTINUED] docusate sodium (STOOL SOFTENER) 100 MG capsule Take 100 mg by mouth.  . [DISCONTINUED] HYDROcodone-acetaminophen (NORCO) 10-325 MG per tablet Take by mouth.  . [DISCONTINUED] HYDROcodone-acetaminophen (NORCO/VICODIN) 5-325 MG per tablet Take by mouth.  . [DISCONTINUED] levothyroxine (SYNTHROID) 125 MCG tablet Take by mouth.  . [DISCONTINUED] levothyroxine (SYNTHROID, LEVOTHROID) 175 MCG tablet Take by mouth.  . [DISCONTINUED] levothyroxine (SYNTHROID, LEVOTHROID) 175 MCG tablet TK 1 T PO QAM  . [DISCONTINUED] Melatonin 5 MG  CAPS Take by mouth.  . [DISCONTINUED] ondansetron (ZOFRAN) 4 MG tablet Take by mouth.  . [DISCONTINUED] ondansetron (ZOFRAN) 4 MG tablet TK 1 T PO Q 8 H PRN N OR VOMITING  . [DISCONTINUED] ondansetron (ZOFRAN-ODT) 4 MG disintegrating tablet Take by mouth.  . [DISCONTINUED] Oxycodone HCl 10 MG TABS TK 1 T PO Q 6 H PRN  . [DISCONTINUED] oxyCODONE-acetaminophen (PERCOCET) 7.5-325 MG per tablet Take by mouth.  . [DISCONTINUED] PEG 3350-KCl-NaBcb-NaCl-NaSulf (PEG 3350/ELECTROLYTES) 240 G SOLR   . [DISCONTINUED] pregabalin (LYRICA) 50 MG capsule Take by mouth.  . [DISCONTINUED] QUEtiapine (SEROQUEL) 200 MG tablet Take 200 mg by mouth.  . [DISCONTINUED] traMADol (ULTRAM) 50 MG tablet Take by mouth.  . [DISCONTINUED] traZODone (DESYREL) 100 MG tablet TAKE 1 TABLET(100 MG) BY MOUTH AT BEDTIME AS NEEDED FOR SLEEP  . [DISCONTINUED] venlafaxine XR (EFFEXOR-XR) 150 MG 24 hr capsule Take 1 capsule (150 mg total) by mouth every morning.  . [DISCONTINUED] Vilazodone HCl (VIIBRYD) 40 MG TABS Take 40 mg by mouth.   No facility-administered encounter medications on file as of 04/04/2015.   ALLERGIES: Allergies  Allergen Reactions  . Nsaids Rash    Internal bleeding  Intestinal bleeding  . Aspirin Rash   VACCINATION STATUS:  There is no immunization history on file for this patient.  HPI  49 yr old female with medical hx as above. She is here to f/u for longstanding hypothyroidism , prediabetes, and vitamin D  deficiency. Her hx starts 10 years ago when she underwent partial thyroidectomy for "inconclusive" FNA. 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  she was initiated on synthroid at various doses, currently at 150 mcg po qam. Since last visit, she continues to feel better. No new complaints.  she denies neck pressure. she denies family hx of thyroid cancer. she denies exposure to neck radiation.   Review of Systems  Constitutional:  - weight  gain/loss, no fatigue, no subjective hyperthermia/hypothermia Eyes: no blurry vision, no xerophthalmia ENT: no sore throat, no nodules palpated in throat, no dysphagia/odynophagia, no hoarseness Cardiovascular: no CP/SOB/palpitations/leg swelling Respiratory: no cough/SOB Gastrointestinal: no N/V/D/C Musculoskeletal: no muscle/joint aches Skin: no rashes Neurological: no tremors/numbness/tingling/dizziness Psychiatric: no depression/anxiety  Objective:    BP 120/77 mmHg  Pulse 92  Ht 5\' 7"  (1.702 m)  Wt 183 lb (83.008 kg)  BMI 28.66 kg/m2  SpO2 97%  Wt Readings from Last 3 Encounters:  04/04/15 183 lb (83.008 kg)    Physical Exam  Constitutional: overweight, in NAD Eyes: PERRLA, EOMI, no exophthalmos ENT: moist mucous membranes, healed post thyroidectomy scar on anterior lower neck, no cervical lymphadenopathy Cardiovascular: RRR, No MRG Respiratory: CTA B Gastrointestinal: abdomen soft, NT, ND, BS+ Musculoskeletal: no deformities, strength intact in all 4 Skin: moist, warm, no rashes Neurological: no tremor with outstretched hands, DTR normal in all 4   Complete Blood Count (Most recent): Lab Results  Component Value Date   WBC 10.4 03/28/2014   HGB 15.4 03/28/2014   HCT 46.5 03/28/2014   MCV 83 03/28/2014   PLT 283 03/28/2014   Chemistry (most recent): Lab Results  Component Value Date   NA 139 03/28/2014   K 3.5 03/28/2014   CL 104 03/28/2014   CO2 24 03/28/2014   BUN 15 03/28/2014   CREATININE 0.95 03/28/2014     Assessment & Plan:   1. Primary hypothyroidism Her TFTs are indicative of appropriate replacement with TH for her current weight.   I will continue Synthroid to 150 mcg po qam .   - We discussed about correct intake of levothyroxine, at fasting, with water, separated by at least 30 minutes from breakfast, and separated by more than 4 hours from calcium, iron, multivitamins, acid reflux medications (PPIs). -Patient is made aware of the fact  that thyroid hormone replacement is needed for life, dose to be adjusted by periodic monitoring of thyroid function tests.   2. Pre-diabetes Her prior a1c was 6%, c/w prediabetes.  Her A1c was not repeated this time. She will not need MTF, but advised her to stay on dietary precaution of low carbs , higher protein.   3. Vitamin D deficiency For vitamin D deficiency, I will retreat her with 50 K units of weekly vitamin D x 12 weeks. Was found to have elevated PTH of 75, associated with normal calcium of 9.3, unclear if she has primary hyperparathyroidism. I will obtain 24-hour urine calcium. She will have repeat PTH and calcium before her next visit.   - I advised patient to maintain close follow up with [facility] for primary care needs. Follow up plan: Return in about 3 months (around 07/03/2015) for follow up with pre-visit labs.  Glade Lloyd, MD Phone: [phone]  Fax: [phone]   04/04/2015, 6:56 PM

## 2015-04-11 ENCOUNTER — Other Ambulatory Visit: Payer: Self-pay | Admitting: "Endocrinology

## 2015-04-12 LAB — CALCIUM, URINE, 24 HOUR
Calcium, 24 hour urine: 20 mg/24 h — ABNORMAL LOW (ref 35–250)
Calcium, Ur: 2 mg/dL

## 2015-04-19 ENCOUNTER — Telehealth: Payer: Self-pay | Admitting: "Endocrinology

## 2015-04-19 NOTE — Telephone Encounter (Signed)
pt called to see if her 24 hour urine results were back yet - please call her back with results

## 2015-04-19 NOTE — Telephone Encounter (Signed)
Pt is requesting that we call her with 24 hr urine results

## 2015-04-20 NOTE — Progress Notes (Signed)
Error

## 2015-04-20 NOTE — Telephone Encounter (Signed)
Pt notified of results

## 2015-04-20 NOTE — Telephone Encounter (Signed)
Please inform patient: 24 hour urine calcium is normal, no intervention needed now. We will discuss further steps during next visit.

## 2015-04-24 DIAGNOSIS — Q8501 Neurofibromatosis, type 1: Secondary | ICD-10-CM

## 2015-04-24 HISTORY — DX: Neurofibromatosis, type 1: Q85.01

## 2015-06-01 DIAGNOSIS — Q8501 Neurofibromatosis, type 1: Secondary | ICD-10-CM | POA: Insufficient documentation

## 2015-06-27 ENCOUNTER — Other Ambulatory Visit: Payer: Self-pay | Admitting: "Endocrinology

## 2015-06-28 LAB — HEMOGLOBIN A1C
Hgb A1c MFr Bld: 5.9 % — ABNORMAL HIGH (ref <5.7)
Mean Plasma Glucose: 123 mg/dL — ABNORMAL HIGH (ref <117)

## 2015-06-28 LAB — BASIC METABOLIC PANEL
BUN: 14 mg/dL (ref 7–25)
CO2: 24 mmol/L (ref 20–31)
Calcium: 9.2 mg/dL (ref 8.6–10.2)
Chloride: 106 mmol/L (ref 98–110)
Creat: 0.99 mg/dL (ref 0.50–1.10)
Glucose, Bld: 72 mg/dL (ref 65–99)
Potassium: 4.6 mmol/L (ref 3.5–5.3)
Sodium: 143 mmol/L (ref 135–146)

## 2015-06-28 LAB — VITAMIN D 25 HYDROXY (VIT D DEFICIENCY, FRACTURES): Vit D, 25-Hydroxy: 22 ng/mL — ABNORMAL LOW (ref 30–100)

## 2015-06-28 LAB — TSH: TSH: 1.59 mIU/L

## 2015-06-28 LAB — PTH, INTACT AND CALCIUM
Calcium: 9.4 mg/dL (ref 8.4–10.5)
PTH: 78 pg/mL — ABNORMAL HIGH (ref 14–64)

## 2015-06-28 LAB — T4, FREE: Free T4: 1.3 ng/dL (ref 0.8–1.8)

## 2015-07-07 ENCOUNTER — Ambulatory Visit (INDEPENDENT_AMBULATORY_CARE_PROVIDER_SITE_OTHER): Payer: 59 | Admitting: "Endocrinology

## 2015-07-07 ENCOUNTER — Encounter: Payer: Self-pay | Admitting: "Endocrinology

## 2015-07-07 VITALS — BP 138/89 | HR 69 | Ht 67.0 in | Wt 177.0 lb

## 2015-07-07 DIAGNOSIS — R7303 Prediabetes: Secondary | ICD-10-CM | POA: Insufficient documentation

## 2015-07-07 DIAGNOSIS — E039 Hypothyroidism, unspecified: Secondary | ICD-10-CM

## 2015-07-07 DIAGNOSIS — E559 Vitamin D deficiency, unspecified: Secondary | ICD-10-CM | POA: Diagnosis not present

## 2015-07-07 MED ORDER — VITAMIN D (ERGOCALCIFEROL) 1.25 MG (50000 UNIT) PO CAPS: 50000.0000 [IU] | ORAL_CAPSULE | ORAL | Status: DC

## 2015-07-07 MED ORDER — LEVOTHYROXINE SODIUM 150 MCG PO TABS: 150.0000 ug | ORAL_TABLET | Freq: Every day | ORAL | Status: DC

## 2015-07-07 NOTE — Progress Notes (Signed)
Subjective:    Patient ID: Jenna Tyler, female    DOB: 1965-11-13, PCP Inc [facility]   Past Medical History  Diagnosis Date  . Hypothyroidism   . Hyperlipidemia    Past Surgical History  Procedure Laterality Date  . Breast biopsy Right 2012    negative  . Thyroid surgery      thyroidectomy  . Other surgical history      excision of lipoma  . Other surgical history      Abdominal surgery   Social History   Social History  . Marital Status: Married    Spouse Name: N/A  . Number of Children: N/A  . Years of Education: N/A   Social History Main Topics  . Smoking status: Former Research scientist (life sciences)  . Smokeless tobacco: None  . Alcohol Use: No  . Drug Use: No  . Sexual Activity: Not Asked   Other Topics Concern  . None   Social History Narrative   Outpatient Encounter Prescriptions as of 07/07/2015  Medication Sig  . busPIRone (BUSPAR) 30 MG tablet Take 1 tablet (30 mg total) by mouth 2 (two) times daily.  Marland Kitchen levothyroxine (SYNTHROID, LEVOTHROID) 150 MCG tablet Take 1 tablet (150 mcg total) by mouth daily before breakfast.  . venlafaxine XR (EFFEXOR-XR) 75 MG 24 hr capsule Take 75 mg by mouth daily with breakfast.  . Vitamin D, Ergocalciferol, (DRISDOL) 50000 units CAPS capsule Take 1 capsule (50,000 Units total) by mouth every 7 (seven) days.  . [DISCONTINUED] levothyroxine (SYNTHROID, LEVOTHROID) 150 MCG tablet Take 1 tablet (150 mcg total) by mouth daily before breakfast.  . [DISCONTINUED] Vitamin D, Ergocalciferol, (DRISDOL) 50000 UNITS CAPS capsule Take by mouth.   No facility-administered encounter medications on file as of 07/07/2015.   ALLERGIES: Allergies  Allergen Reactions  . Nsaids Rash    Internal bleeding  Intestinal bleeding  . Aspirin Rash   VACCINATION STATUS:  There is no immunization history on file for this patient.  HPI  50 yr old female with medical hx as above. She is here to f/u for longstanding hypothyroidism ,  prediabetes, and vitamin D deficiency. Her hx starts 10 years ago when she underwent partial thyroidectomy for "inconclusive" FNA. 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  she was initiated on synthroid at various doses, currently at 150 mcg po qam. Since last visit, she continues to feel better. No new complaints.  she denies neck pressure. she denies family hx of thyroid cancer. she denies exposure to neck radiation.   Review of Systems  Constitutional:  - weight gain/loss, no fatigue, no subjective hyperthermia/hypothermia Eyes: no blurry vision, no xerophthalmia ENT: no sore throat, no nodules palpated in throat, no dysphagia/odynophagia, no hoarseness Cardiovascular: no CP/SOB/palpitations/leg swelling Respiratory: no cough/SOB Gastrointestinal: no N/V/D/C Musculoskeletal: no muscle/joint aches Skin: no rashes Neurological: no tremors/numbness/tingling/dizziness Psychiatric: no depression/anxiety  Objective:    BP 138/89 mmHg  Pulse 69  Ht 5\' 7"  (1.702 m)  Wt 177 lb (80.287 kg)  BMI 27.72 kg/m2  SpO2 96%  Wt Readings from Last 3 Encounters:  07/07/15 177 lb (80.287 kg)  04/04/15 183 lb (83.008 kg)    Physical Exam  Constitutional: overweight, in NAD Eyes: PERRLA, EOMI, no exophthalmos ENT: moist mucous membranes, healed post thyroidectomy scar on anterior lower neck, no cervical lymphadenopathy Cardiovascular: RRR, No MRG Respiratory: CTA B Gastrointestinal: abdomen soft, NT, ND, BS+ Musculoskeletal: no deformities, strength intact  in all 4 Skin: moist, warm, no rashes Neurological: no tremor with outstretched hands, DTR normal in all 4   Complete Blood Count (Most recent): Lab Results  Component Value Date   WBC 10.4 03/28/2014   HGB 15.4 03/28/2014   HCT 46.5 03/28/2014   MCV 83 03/28/2014   PLT 283 03/28/2014   Chemistry (most recent): Lab Results  Component Value Date   NA 143 06/27/2015   K 4.6  06/27/2015   CL 106 06/27/2015   CO2 24 06/27/2015   BUN 14 06/27/2015   CREATININE 0.99 06/27/2015   Results for Jenna Tyler (MRN [id number]) as of 07/07/2015 16:57  Ref. Range 06/27/2015 15:14  TSH Latest Units: mIU/L 1.59  T4,Free(Direct) Latest Ref Range: 0.8-1.8 ng/dL 1.3    Assessment & Plan:   1. Primary hypothyroidism Her TFTs are indicative of appropriate replacement with TH for her current weight.   I will continue Synthroid to 150 mcg po qam .   - We discussed about correct intake of levothyroxine, at fasting, with water, separated by at least 30 minutes from breakfast, and separated by more than 4 hours from calcium, iron, multivitamins, acid reflux medications (PPIs). -Patient is made aware of the fact that thyroid hormone replacement is needed for life, dose to be adjusted by periodic monitoring of thyroid function tests.   2. Pre-diabetes Her a1c is 5.9%, c/w prediabetes.   She will not need MTF, but advised her to stay on dietary precaution of low carbs , higher protein.   3. Vitamin D deficiency - Her vitamin D remains low at 22. For vitamin D deficiency, I will retreat her with 50 K units of weekly vitamin D x 16 weeks. Was found to have elevated PTH of 75, associated with normal calcium of 9.3, unclear if she has primary hyperparathyroidism. -24 hour urine calcium is low at 20. This is against primary hyperparathyroidism. She would not need surgical exploration at this time. She will have repeat PTH and calcium before her next visit.   - I advised patient to maintain close follow up with [facility] for primary care needs. Follow up plan: Return in about 6 months (around 01/07/2016) for follow up with pre-visit labs.  Glade Lloyd, MD Phone: [phone]  Fax: [phone]   07/07/2015, 4:57 PM

## 2015-08-29 ENCOUNTER — Other Ambulatory Visit: Payer: Self-pay | Admitting: "Endocrinology

## 2015-10-12 ENCOUNTER — Other Ambulatory Visit: Payer: Self-pay | Admitting: Internal Medicine

## 2015-10-12 DIAGNOSIS — Z1231 Encounter for screening mammogram for malignant neoplasm of breast: Secondary | ICD-10-CM

## 2015-10-31 ENCOUNTER — Other Ambulatory Visit: Payer: Self-pay | Admitting: Internal Medicine

## 2015-10-31 ENCOUNTER — Ambulatory Visit
Admission: RE | Admit: 2015-10-31 | Discharge: 2015-10-31 | Disposition: A | Discharge status: Home / Self Care | Payer: 59 | Source: Ambulatory Visit | Attending: Internal Medicine | Admitting: Internal Medicine

## 2015-10-31 DIAGNOSIS — Z1231 Encounter for screening mammogram for malignant neoplasm of breast: Secondary | ICD-10-CM

## 2016-01-12 ENCOUNTER — Ambulatory Visit: Payer: BLUE CROSS/BLUE SHIELD | Admitting: "Endocrinology

## 2016-01-24 DIAGNOSIS — K269 Duodenal ulcer, unspecified as acute or chronic, without hemorrhage or perforation: Secondary | ICD-10-CM | POA: Insufficient documentation

## 2016-02-14 ENCOUNTER — Emergency Department: Payer: 59

## 2016-02-14 ENCOUNTER — Encounter: Payer: Self-pay | Admitting: Emergency Medicine

## 2016-02-14 ENCOUNTER — Emergency Department
Admission: EM | Admit: 2016-02-14 | Discharge: 2016-02-14 | Disposition: A | Discharge status: Home / Self Care | Payer: 59 | Attending: Emergency Medicine | Admitting: Emergency Medicine

## 2016-02-14 DIAGNOSIS — R1013 Epigastric pain: Secondary | ICD-10-CM | POA: Diagnosis present

## 2016-02-14 DIAGNOSIS — E039 Hypothyroidism, unspecified: Secondary | ICD-10-CM | POA: Insufficient documentation

## 2016-02-14 DIAGNOSIS — Z87891 Personal history of nicotine dependence: Secondary | ICD-10-CM | POA: Insufficient documentation

## 2016-02-14 DIAGNOSIS — J45909 Unspecified asthma, uncomplicated: Secondary | ICD-10-CM | POA: Diagnosis not present

## 2016-02-14 DIAGNOSIS — Z79899 Other long term (current) drug therapy: Secondary | ICD-10-CM | POA: Insufficient documentation

## 2016-02-14 DIAGNOSIS — K633 Ulcer of intestine: Secondary | ICD-10-CM | POA: Insufficient documentation

## 2016-02-14 HISTORY — DX: Unspecified asthma, uncomplicated: J45.909

## 2016-02-14 HISTORY — DX: Acute pancreatitis without necrosis or infection, unspecified: K85.90

## 2016-02-14 LAB — COMPREHENSIVE METABOLIC PANEL
ALT: 59 U/L — ABNORMAL HIGH (ref 14–54)
AST: 36 U/L (ref 15–41)
Albumin: 4.7 g/dL (ref 3.5–5.0)
Alkaline Phosphatase: 137 U/L — ABNORMAL HIGH (ref 38–126)
Anion gap: 10 (ref 5–15)
BUN: 14 mg/dL (ref 6–20)
CO2: 23 mmol/L (ref 22–32)
Calcium: 9.8 mg/dL (ref 8.9–10.3)
Chloride: 104 mmol/L (ref 101–111)
Creatinine, Ser: 0.89 mg/dL (ref 0.44–1.00)
GFR calc Af Amer: >60 mL/min (ref >60)
GFR calc non Af Amer: >60 mL/min (ref >60)
Glucose, Bld: 110 mg/dL — ABNORMAL HIGH (ref 65–99)
Potassium: 3.7 mmol/L (ref 3.5–5.1)
Sodium: 137 mmol/L (ref 135–145)
Total Bilirubin: 0.9 mg/dL (ref 0.3–1.2)
Total Protein: 8.3 g/dL — ABNORMAL HIGH (ref 6.5–8.1)

## 2016-02-14 LAB — URINALYSIS COMPLETE WITH MICROSCOPIC (ARMC ONLY)
Bilirubin Urine: NEGATIVE
Glucose, UA: NEGATIVE mg/dL
Hgb urine dipstick: NEGATIVE
Ketones, ur: NEGATIVE mg/dL
Nitrite: NEGATIVE
Protein, ur: 30 mg/dL — AB
Specific Gravity, Urine: 1.019 (ref 1.005–1.030)
pH: 5 (ref 5.0–8.0)

## 2016-02-14 LAB — CBC
HCT: 52.5 % — ABNORMAL HIGH (ref 35.0–47.0)
Hemoglobin: 18.2 g/dL — ABNORMAL HIGH (ref 12.0–16.0)
MCH: 28.6 pg (ref 26.0–34.0)
MCHC: 34.7 g/dL (ref 32.0–36.0)
MCV: 82.5 fL (ref 80.0–100.0)
Platelets: 351 10*3/uL (ref 150–440)
RBC: 6.36 MIL/uL — ABNORMAL HIGH (ref 3.80–5.20)
RDW: 14.1 % (ref 11.5–14.5)
WBC: 16.2 10*3/uL — ABNORMAL HIGH (ref 3.6–11.0)

## 2016-02-14 LAB — LIPASE, BLOOD: Lipase: 19 U/L (ref 11–51)

## 2016-02-14 MED ORDER — IOPAMIDOL (ISOVUE-300) INJECTION 61%
30.0000 mL | Freq: Once | INTRAVENOUS | Status: AC | PRN
  Administered 2016-02-14: 30 mL via ORAL

## 2016-02-14 MED ORDER — GI COCKTAIL ~~LOC~~
30.0000 mL | Freq: Once | ORAL | Status: AC
  Administered 2016-02-14: 30 mL via ORAL
  Filled 2016-02-14: qty 30

## 2016-02-14 MED ORDER — SUCRALFATE 1 G PO TABS
1.0000 g | ORAL_TABLET | Freq: Once | ORAL | Status: AC
  Administered 2016-02-14: 1 g via ORAL
  Filled 2016-02-14: qty 1

## 2016-02-14 MED ORDER — IOPAMIDOL (ISOVUE-300) INJECTION 61%
100.0000 mL | Freq: Once | INTRAVENOUS | Status: AC | PRN
  Administered 2016-02-14: 100 mL via INTRAVENOUS

## 2016-02-14 MED ORDER — SODIUM CHLORIDE 0.9 % IV BOLUS (SEPSIS)
500.0000 mL | Freq: Once | INTRAVENOUS | Status: AC
  Administered 2016-02-14: 500 mL via INTRAVENOUS

## 2016-02-14 MED ORDER — SUCRALFATE 1 G PO TABS: 1.0000 g | ORAL_TABLET | Freq: Four times a day (QID) | ORAL | 0 refills | Status: DC

## 2016-02-14 NOTE — ED Notes (Signed)
Pt. Reporting pain 8/10 "I can't even walk or stand straight" asking to see MD because pt. Does not believe Bentyl is an appropriate possibility for treating her abdominal pain. MD notified. Awaiting further orders.

## 2016-02-14 NOTE — ED Notes (Signed)
Pt reports GI cocktail did not help stomach pain

## 2016-02-14 NOTE — ED Notes (Signed)
Patient transported to CT 

## 2016-02-14 NOTE — ED Triage Notes (Signed)
Pt to ED c/o epigastric pain, states started in back on Sunday and radiated to stomach.  States n/v x5.  States hx of pancreatitis and feels similar to previous.  Pt presents A&Ox4, speaking in complete and coherent sentences, chest rise even and unlabored.

## 2016-02-14 NOTE — ED Notes (Signed)

## 2016-02-14 NOTE — Discharge Instructions (Signed)
Please seek medical attention for any high fevers, chest pain, shortness of breath, change in behavior, persistent vomiting, bloody stool or any other new or concerning symptoms.  

## 2016-02-14 NOTE — ED Provider Notes (Signed)
[facility] Emergency Department Provider Note   ____________________________________________   I have reviewed the triage vital signs and the nursing notes.   HISTORY  Chief Complaint Abdominal Pain   History limited by: Not Limited   HPI Jenna Tyler is a 50 y.o. female who presents for abdominal pain. It is located in the epigastric region. It started 2 days ago. The patient has had similar symptoms in the past and is seen by GI in [place]. Has history of pancreatitis and "swollen liver". Additionally patient had a somewhat recent EGD which showed duodenal ulcers. The patient states that she did drink some alcohol over the weekend. The patient denies any fevers. Has had some nausea and decreased oral intake.   Past Medical History:  Diagnosis Date  . Asthma   . Hyperlipidemia   . Hypothyroidism   . Pancreatitis     Patient Active Problem List   Diagnosis Date Noted  . Prediabetes 07/07/2015  . Primary hypothyroidism 04/04/2015  . Pre-diabetes 04/04/2015  . Vitamin D deficiency 04/04/2015  . Anxiety 10/12/2014  . Anxiety, generalized 10/12/2014  . H/O: hypothyroidism 10/12/2014  . Insomnia, persistent 10/12/2014  . Cannot sleep 10/12/2014  . Depression, major, recurrent, moderate ([place]) 10/12/2014  . Drug abuse, opioid type 10/12/2014  . 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/2013    Past Surgical History:  Procedure Laterality Date  . ABDOMINAL HYSTERECTOMY    . BREAST BIOPSY Right 2012   negative  . CHOLECYSTECTOMY    . OTHER SURGICAL HISTORY     excision of lipoma  . OTHER SURGICAL HISTORY     Abdominal surgery  . THYROID SURGERY     thyroidectomy    Prior to Admission medications   Medication Sig Start Date End Date Taking? Authorizing Provider  busPIRone (BUSPAR) 30 MG tablet Take 1 tablet (30 mg total) by mouth 2 (two) times daily. 12/31/14   Marjie Skiff, MD  levothyroxine  (SYNTHROID, LEVOTHROID) 150 MCG tablet Take 1 tablet (150 mcg total) by mouth daily before breakfast. 07/07/15   Cassandria Anger, MD  venlafaxine XR (EFFEXOR-XR) 75 MG 24 hr capsule Take 75 mg by mouth daily with breakfast.    Historical Provider, MD  Vitamin D, Ergocalciferol, (DRISDOL) 50000 units CAPS capsule TAKE 1 CAPSULE ONCE WEEKLY. 08/30/15   Cassandria Anger, MD    Allergies Nsaids; Adhesive [tape]; Aspirin; and Penicillins  Family History  Problem Relation Age of Onset  . Breast cancer Sister 51  . Breast cancer Maternal Aunt 87  . Breast cancer Maternal Grandmother 60  . Breast cancer Maternal Aunt 67    Social History Social History  Substance Use Topics  . Smoking status: Former Research scientist (life sciences)  . Smokeless tobacco: Never Used  . Alcohol use 0.0 oz/week     Comment: 2 glasses whiskey per day    Review of Systems  Constitutional: Negative for fever. Cardiovascular: Negative for chest pain. Respiratory: Negative for shortness of breath. Gastrointestinal: Positive for epigastric pain. Genitourinary: Negative for dysuria. Musculoskeletal: Negative for back pain. Skin: Negative for rash. Neurological: Negative for headaches, focal weakness or numbness.  10-point ROS otherwise negative.  ____________________________________________   PHYSICAL EXAM:  VITAL SIGNS: ED Triage Vitals  Enc Vitals Group     BP 02/14/16 1133 (!) 138/96     Pulse Rate 02/14/16 1133 (!) 126     Resp 02/14/16 1133 18     Temp 02/14/16 1133 97.9 F (36.6  C)     Temp Source 02/14/16 1133 Oral     SpO2 02/14/16 1133 100 %     Weight 02/14/16 1133 173 lb (78.5 kg)     Height 02/14/16 1133 5\' 5"  (1.651 m)     Head Circumference --      Peak Flow --    Constitutional: Alert and oriented. Well appearing and in no distress. Eyes: Conjunctivae are normal. Normal extraocular movements. ENT   Head: Normocephalic and atraumatic.   Nose: No congestion/rhinnorhea.   Mouth/Throat:  Mucous membranes are moist.   Neck: No stridor. Hematological/Lymphatic/Immunilogical: No cervical lymphadenopathy. Cardiovascular: Normal rate, regular rhythm.  No murmurs, rubs, or gallops.  Respiratory: Normal respiratory effort without tachypnea nor retractions. Breath sounds are clear and equal bilaterally. No wheezes/rales/rhonchi. Gastrointestinal: Soft and tender to palpation in the epigastric region. Genitourinary: Deferred Musculoskeletal: Normal range of motion in all extremities. No lower extremity edema. Neurologic:  Normal speech and language. No gross focal neurologic deficits are appreciated.  Skin:  Skin is warm, dry and intact. No rash noted. Psychiatric: Mood and affect are normal. Speech and behavior are normal. Patient exhibits appropriate insight and judgment.  ____________________________________________    LABS (pertinent positives/negatives)  Labs Reviewed  COMPREHENSIVE METABOLIC PANEL - Abnormal; Notable for the following:       Result Value   Glucose, Bld 110 (*)    Total Protein 8.3 (*)    ALT 59 (*)    Alkaline Phosphatase 137 (*)    All other components within normal limits  CBC - Abnormal; Notable for the following:    WBC 16.2 (*)    RBC 6.36 (*)    Hemoglobin 18.2 (*)    HCT 52.5 (*)    All other components within normal limits  URINALYSIS COMPLETEWITH MICROSCOPIC ([facility] ONLY) - Abnormal; Notable for the following:    Color, Urine YELLOW (*)    APPearance TURBID (*)    Protein, ur 30 (*)    Leukocytes, UA 1+ (*)    Bacteria, UA RARE (*)    Squamous Epithelial / LPF 0-5 (*)    All other components within normal limits  LIPASE, BLOOD     ____________________________________________   EKG  I, Nance Pear, attending physician, personally viewed and interpreted this EKG  EKG Time: 1140 Rate: 118 Rhythm: sinus tachycardia Axis: normal Intervals: qtc 454 QRS: narrow, q waves V2, III ST changes: no st elevation Impression:  abnormal ekg  ____________________________________________    RADIOLOGY  CT abd/pel IMPRESSION:  1. No explanation for acute abdominal pain.  2. 16 mm right liver mass consistent with hemangioma.  3. Hysterectomy and cholecystectomy.    DG abd   IMPRESSION:  Negative abdominal radiographs. No acute cardiopulmonary disease.    ____________________________________________   PROCEDURES  Procedures  ____________________________________________   INITIAL IMPRESSION / ASSESSMENT AND PLAN / ED COURSE  Pertinent labs & imaging results that were available during my care of the patient were reviewed by me and considered in my medical decision making (see chart for details).  Patient with epigastric pain. Has been seen by GI doctor for this pain in the past. Recently had EGD that showed ulcers. Work up here without any concerning findings on imaging. Some leukocytosis. Think that given negative imaging likely patient's pain is coming from ulcers. Will add on sucralfate to patients medication regimen. Advised patient to follow up with her GI doctor.   ____________________________________________   FINAL CLINICAL IMPRESSION(S) / ED DIAGNOSES  Final diagnoses:  Ulcer of intestine     Note: This dictation was prepared with Dragon dictation. Any transcriptional errors that result from this process are unintentional    Nance Pear, MD 02/14/16 [phone]

## 2016-03-21 ENCOUNTER — Other Ambulatory Visit: Payer: Self-pay | Admitting: "Endocrinology

## 2016-05-23 ENCOUNTER — Other Ambulatory Visit: Payer: Self-pay | Admitting: Internal Medicine

## 2016-05-23 DIAGNOSIS — R2231 Localized swelling, mass and lump, right upper limb: Secondary | ICD-10-CM

## 2016-05-31 ENCOUNTER — Ambulatory Visit
Admission: RE | Admit: 2016-05-31 | Discharge: 2016-05-31 | Disposition: A | Discharge status: Home / Self Care | Payer: 59 | Source: Ambulatory Visit | Attending: Internal Medicine | Admitting: Internal Medicine

## 2016-05-31 DIAGNOSIS — R2231 Localized swelling, mass and lump, right upper limb: Secondary | ICD-10-CM | POA: Diagnosis present

## 2016-07-05 ENCOUNTER — Ambulatory Visit: Payer: BLUE CROSS/BLUE SHIELD | Admitting: Obstetrics and Gynecology

## 2016-08-06 ENCOUNTER — Ambulatory Visit (INDEPENDENT_AMBULATORY_CARE_PROVIDER_SITE_OTHER): Payer: BLUE CROSS/BLUE SHIELD | Admitting: Obstetrics and Gynecology

## 2016-08-06 ENCOUNTER — Encounter: Payer: Self-pay | Admitting: Obstetrics and Gynecology

## 2016-08-06 VITALS — BP 136/100 | HR 82 | Ht 65.0 in | Wt 178.0 lb

## 2016-08-06 DIAGNOSIS — N393 Stress incontinence (female) (male): Secondary | ICD-10-CM

## 2016-08-06 DIAGNOSIS — N941 Unspecified dyspareunia: Secondary | ICD-10-CM

## 2016-08-06 NOTE — Progress Notes (Addendum)
Obstetrics & Gynecology Office Visit   Chief Complaint:  Chief Complaint  Patient presents with  . surgery consult    Painful intercourse  . Cervical prolapse    History of Present Illness: The patient is a 51 year old female presenting for complaints of urinary incontinence and dyspareunia.  The patient states symptoms have been present for sometime, she is status post prior supracervical hysterectomy by Dr. Ammie Dalton who also told the patient that she had some degree of cervical prolapse.  The patient reports incontinence at time with coughing, sneezing, laughing, and lifting.  Her prior deliveries were C-sections (twins leading twin breech).  She does have a significant history of constipation.  She has does have a component of chronic pelvic pain on review of Dr. Lucianne Lei Dalen's prior notes.  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  There is no urge component, she denies dysuria.    The patient denies excessive caffeine or fluid intake.  She does not have to splint or reduce any vaginal bulges to facilitate defalcation or micturition. Denies the need to double void.  No history of polydipsia, nocturia, or DM.   No history of recurrent UTI's.     Review of Systems: Review of Systems  Gastrointestinal: Positive for abdominal pain and constipation. Negative for blood in stool, diarrhea and melena.  Genitourinary: Negative for dysuria, frequency, hematuria and urgency.    Past Medical History:  Past Medical History:  Diagnosis Date  . Asthma   . Hyperlipidemia   . Hypertension   . Hypothyroidism   . 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 Pancreatitis     Past Surgical History:  Past Surgical History:  Procedure Laterality Date  . ABDOMINAL HYSTERECTOMY    . BREAST BIOPSY Right 2012   negative  . CHOLECYSTECTOMY    . 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 OTHER SURGICAL HISTORY     excision of lipoma  . OTHER SURGICAL HISTORY     Abdominal surgery  . THYROID SURGERY     thyroidectomy    Gynecologic History: No LMP recorded. Patient has had a hysterectomy.  Obstetric History: G5P0010  Family History:  Family History  Problem Relation Age of Onset  . Breast cancer Sister 78  . Breast cancer Maternal Aunt 75  . Breast cancer Maternal Grandmother 60  . Breast cancer Maternal Aunt 41    Social History:  Social History   Social History  . Marital status: Married    Spouse name: N/A  . Number of children: N/A  . Years of education: N/A   Occupational History  . Not on file.   Social History Main Topics  . Smoking status: Former Research scientist (life sciences)  . Smokeless tobacco: Never Used  . Alcohol use 0.0 oz/week     Comment: 2 glasses whiskey per day  . Drug use: No  . Sexual activity: Yes    Birth control/ protection: Surgical   Other Topics Concern  . Not on file   Social History Narrative  . No narrative on file    Allergies:  Allergies  Allergen Reactions  . Nsaids Rash    Internal bleeding  Intestinal bleeding  . Adhesive [Tape] Rash  . Aspirin Rash  . Penicillins Rash    Medications: Prior to Admission medications  Medication Sig Start Date End Date Taking? Authorizing Provider  albuterol (VENTOLIN HFA) 108 (90 Base) MCG/ACT inhaler Inhale into the lungs. 09/15/12  Yes Historical Provider, MD  amitriptyline (ELAVIL) 10 MG tablet Take 2 tablets by mouth daily. 01/17/16  Yes Historical Provider, MD  atenolol (TENORMIN) 25 MG tablet  07/16/16  Yes Historical Provider, MD  Ergocalciferol 2000 units TABS Take 1 tablet by mouth every other day.   Yes Historical Provider, MD  levothyroxine (SYNTHROID, LEVOTHROID) 150 MCG tablet TAKE 1 TABLET BY MOUTH EVERY MORNING BEFORE BREAKFAST 03/21/16  Yes Cassandria Anger, MD  venlafaxine XR (EFFEXOR-XR) 75 MG  24 hr capsule Take 75 mg by mouth daily with breakfast.   Yes Historical Provider, MD    Physical Exam Vitals:  Vitals:   08/06/16 1600  BP: (!) 136/100  Pulse: 82   No LMP recorded. Patient has had a hysterectomy.  General: NAD HEENT: normocephalic, anicteric Pulmonary: No increased work of breathing Abdomen: Soft, non-tender, non-distended.   Genitourinary:  External: Normal external female genitalia.  Normal urethral meatus, normal Bartholin's and Skene's glands.    Vagina: Normal vaginal mucosa, no evidence of any significant prolapse.  Some minimal loss of rugation but no significant signs of vulvovaginal atrophy. The exam is conducted with the back blade of the speculum, with patient  asked to bear down.  No SUI demonstrated with cough in supine position.  The patient does have poor tone when asked to perform a Kegel.    Cervix: Grossly normal in appearance, no bleeding  Uterus: surgically absent  Adnexa: ovaries surgically absent, no adnexal masses  Rectal: good perineal body, good sphincter tone  Lymphatic: no evidence of inguinal lymphadenopathy Extremities: no edema, erythema, or tenderness Neurologic: Grossly intact Psychiatric: mood appropriate, affect full  Female chaperone present for pelvic portions of the physical exam  Assessment: 51 y.o. with SUI and dyspareunia  Plan: Problem List Items Addressed This Visit    None    Visit Diagnoses    Stress incontinence, female    -  Primary   Relevant Orders   Ambulatory referral to Physical Therapy   Ambulatory referral to Urogynecology   Dyspareunia in female          - No significant prolapse noted, suspect larger component of pelvic floor muscle weakness given poor kegel - Urogyencology referral [facility] given history of prior resection of urethral tumor per patient, pelvic PT referral  - Trial of estrogen cream - recommend bowl regimen with benafiber/citrucel/or metamucil +/- colace

## 2016-08-07 MED ORDER — ESTROGENS, CONJUGATED 0.625 MG/GM VA CREA: 1.0000 | TOPICAL_CREAM | VAGINAL | 3 refills | Status: DC

## 2016-08-07 MED ORDER — ESTROGENS, CONJUGATED 0.625 MG/GM VA CREA: TOPICAL_CREAM | VAGINAL | 0 refills | Status: DC

## 2016-08-15 DIAGNOSIS — R0989 Other specified symptoms and signs involving the circulatory and respiratory systems: Secondary | ICD-10-CM | POA: Insufficient documentation

## 2016-08-15 DIAGNOSIS — R198 Other specified symptoms and signs involving the digestive system and abdomen: Secondary | ICD-10-CM | POA: Insufficient documentation

## 2016-08-15 DIAGNOSIS — R1113 Vomiting of fecal matter: Secondary | ICD-10-CM | POA: Insufficient documentation

## 2016-08-15 DIAGNOSIS — R09A2 Foreign body sensation, throat: Secondary | ICD-10-CM | POA: Insufficient documentation

## 2016-08-15 DIAGNOSIS — R6889 Other general symptoms and signs: Secondary | ICD-10-CM | POA: Insufficient documentation

## 2016-08-23 ENCOUNTER — Ambulatory Visit: Payer: BLUE CROSS/BLUE SHIELD | Attending: Obstetrics and Gynecology | Admitting: Physical Therapy

## 2016-10-30 ENCOUNTER — Other Ambulatory Visit: Payer: Self-pay | Admitting: Emergency Medicine

## 2016-10-30 DIAGNOSIS — Z1231 Encounter for screening mammogram for malignant neoplasm of breast: Secondary | ICD-10-CM

## 2016-11-27 ENCOUNTER — Ambulatory Visit
Admission: RE | Admit: 2016-11-27 | Discharge: 2016-11-27 | Disposition: A | Discharge status: Home / Self Care | Payer: BLUE CROSS/BLUE SHIELD | Source: Ambulatory Visit | Attending: Emergency Medicine | Admitting: Emergency Medicine

## 2016-11-27 DIAGNOSIS — Z1231 Encounter for screening mammogram for malignant neoplasm of breast: Secondary | ICD-10-CM | POA: Diagnosis present

## 2016-12-20 ENCOUNTER — Other Ambulatory Visit: Payer: Self-pay | Admitting: Family Medicine

## 2016-12-20 DIAGNOSIS — S0993XA Unspecified injury of face, initial encounter: Secondary | ICD-10-CM

## 2016-12-21 ENCOUNTER — Ambulatory Visit
Admission: RE | Admit: 2016-12-21 | Discharge: 2016-12-21 | Disposition: A | Discharge status: Home / Self Care | Payer: BLUE CROSS/BLUE SHIELD | Source: Ambulatory Visit | Attending: Family Medicine | Admitting: Family Medicine

## 2016-12-21 DIAGNOSIS — S0993XA Unspecified injury of face, initial encounter: Secondary | ICD-10-CM

## 2016-12-26 ENCOUNTER — Encounter: Payer: Self-pay | Admitting: *Deleted

## 2016-12-26 ENCOUNTER — Emergency Department
Admission: EM | Admit: 2016-12-26 | Discharge: 2016-12-28 | Disposition: A | Discharge status: Other Acute Inpatient Hospital | Payer: BLUE CROSS/BLUE SHIELD | Attending: Emergency Medicine | Admitting: Emergency Medicine

## 2016-12-26 DIAGNOSIS — R7303 Prediabetes: Secondary | ICD-10-CM | POA: Insufficient documentation

## 2016-12-26 DIAGNOSIS — F1020 Alcohol dependence, uncomplicated: Secondary | ICD-10-CM

## 2016-12-26 DIAGNOSIS — J45909 Unspecified asthma, uncomplicated: Secondary | ICD-10-CM | POA: Diagnosis not present

## 2016-12-26 DIAGNOSIS — Z79899 Other long term (current) drug therapy: Secondary | ICD-10-CM | POA: Insufficient documentation

## 2016-12-26 DIAGNOSIS — R45851 Suicidal ideations: Secondary | ICD-10-CM | POA: Diagnosis not present

## 2016-12-26 DIAGNOSIS — I10 Essential (primary) hypertension: Secondary | ICD-10-CM | POA: Insufficient documentation

## 2016-12-26 DIAGNOSIS — Z87891 Personal history of nicotine dependence: Secondary | ICD-10-CM | POA: Diagnosis not present

## 2016-12-26 DIAGNOSIS — F332 Major depressive disorder, recurrent severe without psychotic features: Secondary | ICD-10-CM | POA: Diagnosis not present

## 2016-12-26 DIAGNOSIS — F32A Depression, unspecified: Secondary | ICD-10-CM

## 2016-12-26 DIAGNOSIS — F1091 Alcohol use, unspecified, in remission: Secondary | ICD-10-CM

## 2016-12-26 DIAGNOSIS — F329 Major depressive disorder, single episode, unspecified: Secondary | ICD-10-CM | POA: Diagnosis not present

## 2016-12-26 DIAGNOSIS — F1010 Alcohol abuse, uncomplicated: Secondary | ICD-10-CM | POA: Diagnosis present

## 2016-12-26 DIAGNOSIS — E039 Hypothyroidism, unspecified: Secondary | ICD-10-CM | POA: Diagnosis present

## 2016-12-26 DIAGNOSIS — Z7289 Other problems related to lifestyle: Secondary | ICD-10-CM

## 2016-12-26 DIAGNOSIS — Z789 Other specified health status: Secondary | ICD-10-CM

## 2016-12-26 LAB — CBC
HCT: 49.9 % — ABNORMAL HIGH (ref 35.0–47.0)
Hemoglobin: 16.9 g/dL — ABNORMAL HIGH (ref 12.0–16.0)
MCH: 29 pg (ref 26.0–34.0)
MCHC: 34 g/dL (ref 32.0–36.0)
MCV: 85.4 fL (ref 80.0–100.0)
Platelets: 293 10*3/uL (ref 150–440)
RBC: 5.84 MIL/uL — ABNORMAL HIGH (ref 3.80–5.20)
RDW: 14.6 % — ABNORMAL HIGH (ref 11.5–14.5)
WBC: 9.7 10*3/uL (ref 3.6–11.0)

## 2016-12-26 LAB — COMPREHENSIVE METABOLIC PANEL
ALT: 34 U/L (ref 14–54)
AST: 29 U/L (ref 15–41)
Albumin: 4.4 g/dL (ref 3.5–5.0)
Alkaline Phosphatase: 133 U/L — ABNORMAL HIGH (ref 38–126)
Anion gap: 11 (ref 5–15)
BUN: 12 mg/dL (ref 6–20)
CO2: 21 mmol/L — ABNORMAL LOW (ref 22–32)
Calcium: 9.8 mg/dL (ref 8.9–10.3)
Chloride: 106 mmol/L (ref 101–111)
Creatinine, Ser: 0.9 mg/dL (ref 0.44–1.00)
GFR calc Af Amer: >60 mL/min (ref >60)
GFR calc non Af Amer: >60 mL/min (ref >60)
Glucose, Bld: 100 mg/dL — ABNORMAL HIGH (ref 65–99)
Potassium: 4.8 mmol/L (ref 3.5–5.1)
Sodium: 138 mmol/L (ref 135–145)
Total Bilirubin: 0.4 mg/dL (ref 0.3–1.2)
Total Protein: 7.8 g/dL (ref 6.5–8.1)

## 2016-12-26 LAB — URINE DRUG SCREEN, QUALITATIVE (ARMC ONLY)
Amphetamines, Ur Screen: NOT DETECTED
Barbiturates, Ur Screen: NOT DETECTED
Benzodiazepine, Ur Scrn: NOT DETECTED
Cannabinoid 50 Ng, Ur ~~LOC~~: NOT DETECTED
Cocaine Metabolite,Ur ~~LOC~~: NOT DETECTED
MDMA (Ecstasy)Ur Screen: NOT DETECTED
Methadone Scn, Ur: NOT DETECTED
Opiate, Ur Screen: NOT DETECTED
Phencyclidine (PCP) Ur S: NOT DETECTED
Tricyclic, Ur Screen: POSITIVE — AB

## 2016-12-26 MED ORDER — LORAZEPAM 0.5 MG PO TABS
0.5000 mg | ORAL_TABLET | Freq: Once | ORAL | Status: AC
  Administered 2016-12-26: 0.5 mg via ORAL
  Filled 2016-12-26: qty 1

## 2016-12-26 MED ORDER — ACETAMINOPHEN 500 MG PO TABS
1000.0000 mg | ORAL_TABLET | Freq: Once | ORAL | Status: AC
  Administered 2016-12-26: 1000 mg via ORAL
  Filled 2016-12-26: qty 2

## 2016-12-26 NOTE — ED Notes (Signed)

## 2016-12-26 NOTE — ED Notes (Signed)
SOC    CALLED 

## 2016-12-26 NOTE — ED Notes (Signed)
Pt changed by this RN, gold colored ring and clothing placed in pt belongings bag

## 2016-12-26 NOTE — ED Notes (Signed)
PT IS VOL/ PENDING SOC CONSULT

## 2016-12-26 NOTE — ED Provider Notes (Signed)
[facility] Emergency Department Provider Note   ____________________________________________   I have reviewed the triage vital signs and the nursing notes.   HISTORY  Chief Complaint Suicidal and Alcohol Problem   History limited by: Not Limited   HPI Jenna Tyler is a 51 y.o. female who presents to the emergency department today because of concerns fordepression and desire for alcohol detox. The patient states that she's been feeling more stressed and depressed past couple of weeks. She states it is coming from her family was pressuring her to detox from alcohol. She states she goes through about a fifth of alcohol every 4-5 days. She would like to detox from alcohol. She denies any cough acute withdrawals from alcohol in the past. She denies any acute medical illness.    Past Medical History:  Diagnosis Date  . Asthma   . Hyperlipidemia   . Hypertension   . Hypothyroidism   . 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 Pancreatitis     Patient Active Problem List   Diagnosis Date Noted  . Prediabetes 07/07/2015  . Primary hypothyroidism 04/04/2015  . Pre-diabetes 04/04/2015  . Vitamin D deficiency 04/04/2015  . Anxiety 10/12/2014  . Anxiety, generalized 10/12/2014  . H/O: hypothyroidism 10/12/2014  . Insomnia, persistent 10/12/2014  . Cannot sleep 10/12/2014  . Depression, major, recurrent, moderate ([place]) 10/12/2014  . Drug abuse, opioid type 10/12/2014  . 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/2013    Past Surgical History:  Procedure Laterality Date  . ABDOMINAL HYSTERECTOMY    . BREAST BIOPSY Right 2012   negative  . CHOLECYSTECTOMY    . 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 OTHER SURGICAL HISTORY     excision of lipoma  . OTHER SURGICAL HISTORY     Abdominal surgery  . THYROID SURGERY     thyroidectomy    Prior to Admission medications   Medication Sig Start Date End Date  Taking? Authorizing Provider  albuterol (VENTOLIN HFA) 108 (90 Base) MCG/ACT inhaler Inhale into the lungs. 09/15/12   [provider]  amitriptyline (ELAVIL) 10 MG tablet Take 2 tablets by mouth daily. 01/17/16   [provider]  atenolol (TENORMIN) 25 MG tablet  07/16/16   [provider]  conjugated estrogens (PREMARIN) vaginal cream 1 g vaginally nightly at bedtime for 2 weeks, 1 g vaginally every other night at bedtime for 2 weeks, then 1 g vaginally twice weekly 08/07/16   Malachy Mood, MD  conjugated estrogens (PREMARIN) vaginal cream Place 1 Applicatorful vaginally 2 (two) times a week. This will be the maintenance dose once you finish the initial taper 08/09/16   Malachy Mood, MD  Ergocalciferol 2000 units TABS Take 1 tablet by mouth every other day.    [provider]  levothyroxine (SYNTHROID, LEVOTHROID) 150 MCG tablet TAKE 1 TABLET BY MOUTH EVERY MORNING BEFORE BREAKFAST 03/21/16   Cassandria Anger, MD  venlafaxine XR (EFFEXOR-XR) 75 MG 24 hr capsule Take 75 mg by mouth daily with breakfast.    [provider]    Allergies Nsaids; Adhesive [tape]; Aspirin; and Penicillins  Family History  Problem Relation Age of Onset  . Breast cancer Sister 27  . Breast cancer Maternal Aunt 43  . Breast cancer Maternal Grandmother 60  . Breast cancer Maternal Aunt 47    Social History Social History  Substance Use Topics  . Smoking status: Former Research scientist (life sciences)  . Smokeless tobacco: Never Used  . Alcohol  use 0.0 oz/week     Comment: 2 glasses whiskey per day    Review of Systems Constitutional: No fever/chills Eyes: No visual changes. ENT: No sore throat. Cardiovascular: Denies chest pain. Respiratory: Denies shortness of breath. Gastrointestinal: No abdominal pain.  No nausea, no vomiting.  No diarrhea.   Genitourinary: Negative for dysuria. Musculoskeletal: Negative for back pain. Skin: Negative for rash. Neurological: Negative  for headaches, focal weakness or numbness.  ____________________________________________   PHYSICAL EXAM:  VITAL SIGNS: ED Triage Vitals [12/26/16 1755]  Enc Vitals Group     BP (!) 169/105     Pulse Rate 94     Resp 18     Temp 98.3 F (36.8 C)     Temp Source Oral     SpO2 99 %     Weight 174 lb (78.9 kg)     Height 5\' 5"  (1.651 m)   Constitutional: Alert and oriented. Well appearing and in no distress. Eyes: Conjunctivae are normal.  ENT   Head: Normocephalic and atraumatic.   Nose: No congestion/rhinnorhea.   Mouth/Throat: Mucous membranes are moist.   Neck: No stridor. Hematological/Lymphatic/Immunilogical: No cervical lymphadenopathy. Cardiovascular: Normal rate, regular rhythm.  No murmurs, rubs, or gallops.  Respiratory: Normal respiratory effort without tachypnea nor retractions. Breath sounds are clear and equal bilaterally. No wheezes/rales/rhonchi. Gastrointestinal: Soft and non tender. No rebound. No guarding.  Genitourinary: Deferred Musculoskeletal: Normal range of motion in all extremities. No lower extremity edema. Neurologic:  Normal speech and language. No gross focal neurologic deficits are appreciated.  Skin:  Skin is warm, dry and intact. No rash noted. Psychiatric: Mood and affect are normal. Speech and behavior are normal. Patient exhibits appropriate insight and judgment.  ____________________________________________    LABS (pertinent positives/negatives)  Labs Reviewed  COMPREHENSIVE METABOLIC PANEL - Abnormal; Notable for the following:       Result Value   CO2 21 (*)    Glucose, Bld 100 (*)    Alkaline Phosphatase 133 (*)    All other components within normal limits  CBC - Abnormal; Notable for the following:    RBC 5.84 (*)    Hemoglobin 16.9 (*)    HCT 49.9 (*)    RDW 14.6 (*)    All other components within normal limits  URINE DRUG SCREEN, QUALITATIVE ([facility] ONLY) - Abnormal; Notable for the following:    Tricyclic, Ur  Screen POSITIVE (*)    All other components within normal limits  ACETAMINOPHEN LEVEL  ETHANOL  SALICYLATE LEVEL     ____________________________________________   EKG  None  ____________________________________________    RADIOLOGY  None  ____________________________________________   PROCEDURES  Procedures  ____________________________________________   INITIAL IMPRESSION / ASSESSMENT AND PLAN / ED COURSE  Pertinent labs & imaging results that were available during my care of the patient were reviewed by me and considered in my medical decision making (see chart for details).  Patient presented to the emergency department tonight because of concerns for depression and alcohol use. Patient will be seen by [facility].  ____________________________________________   FINAL CLINICAL IMPRESSION(S) / ED DIAGNOSES  Final diagnoses:  Depression, unspecified depression type  Alcohol use     Note: This dictation was prepared with Dragon dictation. Any transcriptional errors that result from this process are unintentional     Nance Pear, MD 12/26/16 2330

## 2016-12-26 NOTE — ED Triage Notes (Signed)
States she is seeking detox from etoh and is having vague thoughts of SI, denies a plan, last drink this AM, states she drinks 1/5th of vodka in 3 days, awake and alert in no acute distress

## 2016-12-27 DIAGNOSIS — F332 Major depressive disorder, recurrent severe without psychotic features: Secondary | ICD-10-CM

## 2016-12-27 DIAGNOSIS — F1020 Alcohol dependence, uncomplicated: Secondary | ICD-10-CM

## 2016-12-27 DIAGNOSIS — F1091 Alcohol use, unspecified, in remission: Secondary | ICD-10-CM

## 2016-12-27 MED ORDER — ATENOLOL 25 MG PO TABS
25.0000 mg | ORAL_TABLET | Freq: Every day | ORAL | Status: DC
  Administered 2016-12-27 – 2016-12-28 (×2): 25 mg via ORAL
  Filled 2016-12-27 (×2): qty 1

## 2016-12-27 MED ORDER — LEVOTHYROXINE SODIUM 112 MCG PO TABS
112.0000 ug | ORAL_TABLET | Freq: Every day | ORAL | Status: DC
  Administered 2016-12-27 – 2016-12-28 (×2): 112 ug via ORAL
  Filled 2016-12-27 (×3): qty 1

## 2016-12-27 MED ORDER — LORAZEPAM 1 MG PO TABS
ORAL_TABLET | ORAL | Status: AC
  Administered 2016-12-27: 1 mg via ORAL
  Filled 2016-12-27: qty 1

## 2016-12-27 MED ORDER — PROMETHAZINE HCL 25 MG PO TABS
12.5000 mg | ORAL_TABLET | Freq: Four times a day (QID) | ORAL | Status: DC | PRN
  Administered 2016-12-27: 12.5 mg via ORAL
  Filled 2016-12-27: qty 1

## 2016-12-27 MED ORDER — LORAZEPAM 2 MG PO TABS
0.0000 mg | ORAL_TABLET | Freq: Two times a day (BID) | ORAL | Status: DC
Start: 2016-12-29 — End: 2016-12-28

## 2016-12-27 MED ORDER — OXYCODONE-ACETAMINOPHEN 5-325 MG PO TABS
1.0000 | ORAL_TABLET | Freq: Four times a day (QID) | ORAL | Status: DC | PRN
  Administered 2016-12-27 – 2016-12-28 (×5): 1 via ORAL
  Filled 2016-12-27 (×5): qty 1

## 2016-12-27 MED ORDER — AMITRIPTYLINE HCL 10 MG PO TABS
20.0000 mg | ORAL_TABLET | Freq: Every day | ORAL | Status: DC
  Administered 2016-12-27 – 2016-12-28 (×2): 20 mg via ORAL
  Filled 2016-12-27 (×2): qty 2

## 2016-12-27 MED ORDER — VENLAFAXINE HCL 75 MG PO TABS
75.0000 mg | ORAL_TABLET | Freq: Every day | ORAL | Status: DC
  Administered 2016-12-27 – 2016-12-28 (×2): 75 mg via ORAL
  Filled 2016-12-27 (×2): qty 1

## 2016-12-27 MED ORDER — LORAZEPAM 1 MG PO TABS
ORAL_TABLET | ORAL | Status: AC
  Filled 2016-12-27: qty 1

## 2016-12-27 MED ORDER — LORAZEPAM 2 MG PO TABS
0.0000 mg | ORAL_TABLET | Freq: Four times a day (QID) | ORAL | Status: DC
Start: 2016-12-27 — End: 2016-12-28
  Administered 2016-12-27 – 2016-12-28 (×5): 1 mg via ORAL

## 2016-12-27 MED ORDER — TRAZODONE HCL 50 MG PO TABS
ORAL_TABLET | ORAL | Status: AC
  Administered 2016-12-27: 50 mg via ORAL
  Filled 2016-12-27: qty 1

## 2016-12-27 MED ORDER — AMITRIPTYLINE HCL 10 MG PO TABS
20.0000 mg | ORAL_TABLET | Freq: Every day | ORAL | Status: DC
  Filled 2016-12-27 (×2): qty 2

## 2016-12-27 MED ORDER — TIZANIDINE HCL 4 MG PO TABS
4.0000 mg | ORAL_TABLET | Freq: Three times a day (TID) | ORAL | Status: DC
  Administered 2016-12-27 – 2016-12-28 (×3): 4 mg via ORAL
  Filled 2016-12-27 (×7): qty 1

## 2016-12-27 MED ORDER — THIAMINE HCL 100 MG/ML IJ SOLN: 100.0000 mg | Freq: Every day | INTRAMUSCULAR | Status: DC

## 2016-12-27 MED ORDER — VITAMIN B-1 100 MG PO TABS
100.0000 mg | ORAL_TABLET | Freq: Every day | ORAL | Status: DC
  Administered 2016-12-27 – 2016-12-28 (×2): 100 mg via ORAL
  Filled 2016-12-27 (×2): qty 1

## 2016-12-27 MED ORDER — LORAZEPAM 2 MG/ML IJ SOLN: 0.0000 mg | Freq: Four times a day (QID) | INTRAMUSCULAR | Status: DC

## 2016-12-27 MED ORDER — VITAMIN D (ERGOCALCIFEROL) 1.25 MG (50000 UNIT) PO CAPS
50000.0000 [IU] | ORAL_CAPSULE | ORAL | Status: DC
  Administered 2016-12-27: 50000 [IU] via ORAL
  Filled 2016-12-27: qty 1

## 2016-12-27 MED ORDER — TRAZODONE HCL 50 MG PO TABS
50.0000 mg | ORAL_TABLET | Freq: Every day | ORAL | Status: DC
Start: 2016-12-27 — End: 2016-12-28
  Administered 2016-12-27 – 2016-12-28 (×3): 50 mg via ORAL
  Filled 2016-12-27 (×2): qty 1

## 2016-12-27 MED ORDER — ALBUTEROL SULFATE HFA 108 (90 BASE) MCG/ACT IN AERS
1.0000 | INHALATION_SPRAY | Freq: Every day | RESPIRATORY_TRACT | Status: DC | PRN
  Filled 2016-12-27: qty 6.7

## 2016-12-27 MED ORDER — LORAZEPAM 2 MG/ML IJ SOLN
0.0000 mg | Freq: Two times a day (BID) | INTRAMUSCULAR | Status: DC
Start: 2016-12-29 — End: 2016-12-28

## 2016-12-27 MED ORDER — ATENOLOL 25 MG PO TABS
25.0000 mg | ORAL_TABLET | Freq: Every day | ORAL | Status: DC
  Administered 2016-12-27: 25 mg via ORAL
  Filled 2016-12-27: qty 1

## 2016-12-27 NOTE — ED Notes (Signed)
Patient resting quietly in room. No noted distress or abnormal behaviors noted. Will continue 15 minute checks and observation by security camera for safety. 

## 2016-12-27 NOTE — ED Notes (Signed)
SOC recommends inpatient hospitalization.

## 2016-12-27 NOTE — ED Notes (Signed)
PT VOL pending placement.

## 2016-12-27 NOTE — ED Notes (Signed)
Patient asleep in room. No noted distress or abnormal behavior. Will continue 15 minute checks and observation by security cameras for safety. 

## 2016-12-27 NOTE — ED Notes (Signed)
Pt complaining of nausea.  No vomiting. PRN medication administered as ordered.

## 2016-12-27 NOTE — ED Notes (Signed)
Pt. To BHU from ED ambulatory without difficulty, to room  BHU2. Report from [facility]. Pt. Is alert and oriented, warm and dry in no distress. Pt. Denies SI, HI, and AVH. Pt states she does have SI plan at home but not here. Patient able to contract for safety with this Probation officer. Pt. Calm and cooperative. Pt. Made aware of security cameras and Q15 minute rounds. Pt. Encouraged to let Nursing staff know of any concerns or needs.

## 2016-12-27 NOTE — ED Notes (Signed)
Pt. Alert and oriented, warm and dry, in no distress. Pt. Denies SI, HI, and AVH. Pt states not having any SI here but if she was to leave she might. Pt. Encouraged to let nursing staff know of any concerns or needs.

## 2016-12-27 NOTE — BH Assessment (Signed)
Assessment Note  Jenna Tyler is an 51 y.o. female presenting to the ED voluntarily for concerns with alcohol abuse and suicidal ideations without intent.  Patient reports relapsing after a year of sobriety.  Patient reports feeling depressed because her children think she is worthless and useless.  Patient states that her children are on "her case" constantly about her drinking.  Patient states she was thinking about deceased family members and says she felt an overwhelming urge to take a drink.  She report she has been drinking 1/5 vodka every day.    Pt denies HI but reports vague suicidal ideations.  She reports thoughts of finding a gun to shoot herself but states she would not act out on these thoughts.  Pt denies auditory/visual hallucinations.    Diagnosis: Alcohol Use Disorder  Past Medical History:  Past Medical History:  Diagnosis Date  . Asthma   . Hyperlipidemia   . Hypertension   . Hypothyroidism   . 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 Pancreatitis     Past Surgical History:  Procedure Laterality Date  . ABDOMINAL HYSTERECTOMY    . BREAST BIOPSY Right 2012   negative  . CHOLECYSTECTOMY    . 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 OTHER SURGICAL HISTORY     excision of lipoma  . OTHER SURGICAL HISTORY     Abdominal surgery  . THYROID SURGERY     thyroidectomy    Family History:  Family History  Problem Relation Age of Onset  . Breast cancer Sister 51  . Breast cancer Maternal Aunt 69  . Breast cancer Maternal Grandmother 60  . Breast cancer Maternal Aunt 57    Social History:  reports that she has quit smoking. She has never used smokeless tobacco. She reports that she drinks alcohol. She reports that she does not use drugs.  Additional Social History:  Alcohol / Drug Use Pain Medications: See PTA Prescriptions: See PTA Over the Counter: See PTA History of alcohol / drug use?: Yes Longest period of sobriety (when/how long): 1  year Negative Consequences of Use: Personal relationships  CIWA: CIWA-Ar BP: (!) 146/103 Pulse Rate: 77 COWS:    Allergies:  Allergies  Allergen Reactions  . Nsaids Rash    Internal bleeding  Intestinal bleeding  . Adhesive [Tape] Rash  . Aspirin Rash  . Penicillins Rash    Home Medications:  (Not in a hospital admission)  OB/GYN Status:  No LMP recorded. Patient has had a hysterectomy.  General Assessment Data Location of Assessment: [facility] ED TTS Assessment: In system Is this a Tele or Face-to-Face Assessment?: Face-to-Face Is this an Initial Assessment or a Re-assessment for this encounter?: Initial Assessment Marital status: Single Maiden name: na Is patient pregnant?: No Pregnancy Status: No Living Arrangements: Alone Can pt return to current living arrangement?: Yes Admission Status: Voluntary Is patient capable of signing voluntary admission?: Yes Referral Source: Self/Family/Friend Insurance type: [place] Living Arrangements: Alone Legal Guardian: Other: (self) Name of Psychiatrist: none reported Name of Therapist: none reported  Education Status Is patient currently in school?: No Current Grade: na Highest grade of school patient has completed: 12 Name of school: na Contact person: na  Risk to self with the past 6 months Suicidal Ideation: Yes-Currently Present Has patient been a risk to self within the past 6 months prior to admission? : No Suicidal Intent: No Has patient had any suicidal intent within the past 6 months prior  to admission? : No Is patient at risk for suicide?: No Suicidal Plan?: No Has patient had any suicidal plan within the past 6 months prior to admission? : No Access to Means: No What has been your use of drugs/alcohol within the last 12 months?: 1/5 vodka daily Previous Attempts/Gestures: No Other Self Harm Risks: alcohol addiction Triggers for Past Attempts: Anniversary Intentional Self Injurious  Behavior: None Family Suicide History: No Recent stressful life event(s): Conflict (Comment), Loss (Comment) (conflict with children, family member's deaths) Persecutory voices/beliefs?: No Depression: Yes Depression Symptoms: Loss of interest in usual pleasures, Feeling worthless/self pity, Isolating Substance abuse history and/or treatment for substance abuse?: Yes Suicide prevention information given to non-admitted patients: Not applicable  Risk to Others within the past 6 months Homicidal Ideation: No Does patient have any lifetime risk of violence toward others beyond the six months prior to admission? : No Thoughts of Harm to Others: No Current Homicidal Intent: No Current Homicidal Plan: No Access to Homicidal Means: No Identified Victim: none identified History of harm to others?: No Assessment of Violence: None Noted Violent Behavior Description: none Does patient have access to weapons?: No Criminal Charges Pending?: No Does patient have a court date: No Is patient on probation?: No  Psychosis Hallucinations: None noted Delusions: None noted  Mental Status Report Appearance/Hygiene: In scrubs Eye Contact: Good Motor Activity: Freedom of movement Speech: Logical/coherent Level of Consciousness: Alert Mood: Depressed, Pleasant Affect: Appropriate to circumstance, Depressed, Sad Anxiety Level: Minimal Thought Processes: Coherent, Relevant Judgement: Unimpaired Orientation: Person, Place, Time, Situation Obsessive Compulsive Thoughts/Behaviors: None  Cognitive Functioning Concentration: Normal Memory: Recent Intact, Remote Intact IQ: Average Insight: Good Impulse Control: Good Appetite: Fair Sleep: Decreased Total Hours of Sleep: 5 Vegetative Symptoms: None  ADLScreening [facility] Assessment Services) Patient's cognitive ability adequate to safely complete daily activities?: Yes Patient able to express need for assistance with ADLs?: Yes Independently  performs ADLs?: Yes (appropriate for developmental age)  Prior Inpatient Therapy Prior Inpatient Therapy: No Prior Therapy Dates: na Prior Therapy Facilty/Provider(s): na Reason for Treatment: na  Prior Outpatient Therapy Prior Outpatient Therapy: No Prior Therapy Dates: na Prior Therapy Facilty/Provider(s): na Reason for Treatment: na Does patient have an ACCT team?: No Does patient have Intensive In-House Services?  : No Does patient have [facility] services? : No Does patient have P4CC services?: No  ADL Screening (condition at time of admission) Patient's cognitive ability adequate to safely complete daily activities?: Yes Is the patient deaf or have difficulty hearing?: No Does the patient have difficulty seeing, even when wearing glasses/contacts?: No Does the patient have difficulty concentrating, remembering, or making decisions?: No Patient able to express need for assistance with ADLs?: Yes Does the patient have difficulty dressing or bathing?: No Independently performs ADLs?: Yes (appropriate for developmental age) Does the patient have difficulty walking or climbing stairs?: No Weakness of Legs: None Weakness of Arms/Hands: None  Home Assistive Devices/Equipment Home Assistive Devices/Equipment: None  Therapy Consults (therapy consults require a physician order) PT Evaluation Needed: No OT Evalulation Needed: No SLP Evaluation Needed: No Abuse/Neglect Assessment (Assessment to be complete while patient is alone) Physical Abuse: Denies Verbal Abuse: Denies Sexual Abuse: Denies Exploitation of patient/patient's resources: Denies Self-Neglect: Denies Values / Beliefs Cultural Requests During Hospitalization: None Spiritual Requests During Hospitalization: None Consults Spiritual Care Consult Needed: No Social Work Consult Needed: No Regulatory affairs officer (For Healthcare) Does Patient Have a Medical Advance Directive?: No Would patient like information on creating  a medical advance directive?: No -  Patient declined    Additional Information 1:1 In Past 12 Months?: No CIRT Risk: No Elopement Risk: No Does patient have medical clearance?: Yes     Disposition:  Disposition Initial Assessment Completed for this Encounter: Yes Disposition of Patient: Other dispositions Other disposition(s): Other (Comment) (Pending Psych MD consult)  On Site Evaluation by:   Reviewed with Physician:    Oneita Hurt 12/27/2016 12:31 AM

## 2016-12-27 NOTE — ED Notes (Signed)
Pt sitting in dayroom watching TV. Maintained on 15 minute checks and observation by security camera for safety.

## 2016-12-27 NOTE — Progress Notes (Signed)
Pt accepted at [facility] for admission on 12/28/16 after 9 AM.  Dr. Dareen Piano is the accepting provider.  Call report to [phone].  [facility] Charge Nurse notified.  Areatha Keas. Judi Cong, MSW, [place] [facility] Clinical Social Work [phone] (cell) [phone] (office)

## 2016-12-27 NOTE — ED Notes (Signed)
Report given to SOC 

## 2016-12-27 NOTE — ED Notes (Signed)

## 2016-12-27 NOTE — ED Notes (Signed)
Pt requested more pain medication for headache (6/10). Medication administered as ordered. Pt remains calm and cooperative. Maintained on 15 minute checks and observation by security camera for safety.

## 2016-12-27 NOTE — ED Notes (Signed)
Referral information for Psychiatric Hospitalization faxed to;     [facility] [phone]),    [facility] [phone])   [facility] [phone]),    [facility] [phone]),    [facility] [phone]).

## 2016-12-27 NOTE — Consult Note (Signed)
[facility] Face-to-Face Psychiatry Consult   Reason for Consult:  Consult for 51 year old woman with a history of alcohol abuse and depression who comes to the hospital saying she is having suicidal thoughts. Referring Physician:  Clearnce Hasten Patient Identification: Jenna Tyler MRN:  [id number] Principal Diagnosis: Severe recurrent major depression without psychotic features [facility]) Diagnosis:   Patient Active Problem List   Diagnosis Date Noted  . Alcohol dependence ([place]) [F10.20] 12/27/2016  . Severe recurrent major depression without psychotic features ([place]) [F33.2] 12/27/2016  . Prediabetes [R73.03] 07/07/2015  . Primary hypothyroidism [E03.9] 04/04/2015  . Pre-diabetes [R73.03] 04/04/2015  . Vitamin D deficiency [E55.9] 04/04/2015  . Anxiety [F41.9] 10/12/2014  . Anxiety, generalized [F41.1] 10/12/2014  . H/O: hypothyroidism [Z86.39] 10/12/2014  . Insomnia, persistent [G47.00] 10/12/2014  . Cannot sleep [G47.00] 10/12/2014  . Depression, major, recurrent, moderate ([place]) [F33.1] 10/12/2014  . Drug abuse, opioid type [F11.10] 10/12/2014  . 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 Abdominal lipoma [D17.1] 09/15/2013    Total Time spent with patient: 1 hour  Subjective:   Jenna Tyler is a 51 y.o. female patient admitted with "I'm having suicidal thoughts off and on".  HPI:  Patient interviewed chart reviewed. 51 year old woman comes to the emergency room reporting that her mood has been getting worse for the last several months. She feels depressed all the time. Energy level is low. Feels jittery. Sleep is impaired. Appetite is poor. She says her alcohol use has been escalating and she is drinking about a 750 mL bottle every 1-2 days. Denies that she is abusing any other drugs. Patient is taking antidepressant medication prescribed by primary care doctor. She says she has had suicidal thoughts with thoughts of overdosing or cutting herself. Has not acted on it. No  report of any psychosis. Major stressors from her alcohol abuse and family being unsupportive and angry with her.  Medical history: Patient has hypothyroidism history of hypertension vitamin D deficiency  Social history: Married lives with husband has 2 children at home. Not working outside the home.  Substance abuse history: Patient has a past history of opiate abuse for which she has previously been hospitalized. She says she has managed to stop her abuse of opiates entirely but now she is having a problem drinking that has escalated over the last year. She has had probably some blackouts. No history of seizures or delirium tremens has gone to "a few" AA meetings but hasn't been involved in any substance abuse treatment since starting with the alcohol  Past Psychiatric History: Patient has prior hospitalization here about 4 years ago at which time she was depressed and having problem with opiates. She has been on chronic antidepressants prescribed by primary care doctor. Has a history of a suicide attempt as a teenager. No history of mania or psychosis  Risk to Self: Suicidal Ideation: Yes-Currently Present Suicidal Intent: No Is patient at risk for suicide?: No Suicidal Plan?: No Access to Means: No What has been your use of drugs/alcohol within the last 12 months?: 1/5 vodka daily Other Self Harm Risks: alcohol addiction Triggers for Past Attempts: Anniversary Intentional Self Injurious Behavior: None Risk to Others: Homicidal Ideation: No Thoughts of Harm to Others: No Current Homicidal Intent: No Current Homicidal Plan: No Access to Homicidal Means: No Identified Victim: none identified History of harm to others?: No Assessment of Violence: None Noted Violent Behavior Description: none Does patient have access to weapons?: No Criminal Charges Pending?: No Does patient have a court  date: No Prior Inpatient Therapy: Prior Inpatient Therapy: No Prior Therapy Dates: na Prior  Therapy Facilty/Provider(s): na Reason for Treatment: na Prior Outpatient Therapy: Prior Outpatient Therapy: No Prior Therapy Dates: na Prior Therapy Facilty/Provider(s): na Reason for Treatment: na Does patient have an ACCT team?: No Does patient have Intensive In-House Services?  : No Does patient have [facility] services? : No Does patient have P4CC services?: No  Past Medical History:  Past Medical History:  Diagnosis Date  . Asthma   . Hyperlipidemia   . Hypertension   . Hypothyroidism   . 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 Pancreatitis     Past Surgical History:  Procedure Laterality Date  . ABDOMINAL HYSTERECTOMY    . BREAST BIOPSY Right 2012   negative  . CHOLECYSTECTOMY    . 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 OTHER SURGICAL HISTORY     excision of lipoma  . OTHER SURGICAL HISTORY     Abdominal surgery  . THYROID SURGERY     thyroidectomy   Family History:  Family History  Problem Relation Age of Onset  . Breast cancer Sister 7  . Breast cancer Maternal Aunt 99  . Breast cancer Maternal Grandmother 60  . Breast cancer Maternal Aunt 40   Family Psychiatric  History: Positive for depression Social History:  History  Alcohol Use  . 0.0 oz/week    Comment: 2 glasses whiskey per day     History  Drug Use No    Social History   Social History  . Marital status: Married    Spouse name: N/A  . Number of children: N/A  . Years of education: N/A   Social History Main Topics  . Smoking status: Former Research scientist (life sciences)  . Smokeless tobacco: Never Used  . Alcohol use 0.0 oz/week     Comment: 2 glasses whiskey per day  . Drug use: No  . Sexual activity: Yes    Birth control/ protection: Surgical   Other Topics Concern  . None   Social History Narrative  . None   Additional Social History:    Allergies:   Allergies  Allergen Reactions  . Nsaids Rash    Internal bleeding  Intestinal bleeding  . Adhesive [Tape] Rash  . Aspirin Rash   . Penicillins Rash    Labs:  Results for orders placed or performed during the hospital encounter of 12/26/16 (from the past 48 hour(s))  Comprehensive metabolic panel     Status: Abnormal   Collection Time: 12/26/16  5:56 PM  Result Value Ref Range   Sodium 138 135 - 145 mmol/L   Potassium 4.8 3.5 - 5.1 mmol/L   Chloride 106 101 - 111 mmol/L   CO2 21 (L) 22 - 32 mmol/L   Glucose, Bld 100 (H) 65 - 99 mg/dL   BUN 12 6 - 20 mg/dL   Creatinine, Ser 0.90 0.44 - 1.00 mg/dL   Calcium 9.8 8.9 - 10.3 mg/dL   Total Protein 7.8 6.5 - 8.1 g/dL   Albumin 4.4 3.5 - 5.0 g/dL   AST 29 15 - 41 U/L   ALT 34 14 - 54 U/L   Alkaline Phosphatase 133 (H) 38 - 126 U/L   Total Bilirubin 0.4 0.3 - 1.2 mg/dL   GFR calc non Af Amer >60 >60 mL/min   GFR calc Af Amer >60 >60 mL/min    Comment: (NOTE) The eGFR has been calculated using the CKD EPI equation. This calculation has not been validated  in all clinical situations. eGFR's persistently <60 mL/min signify possible Chronic Kidney Disease.    Anion gap 11 5 - 15  cbc     Status: Abnormal   Collection Time: 12/26/16  5:56 PM  Result Value Ref Range   WBC 9.7 3.6 - 11.0 K/uL   RBC 5.84 (H) 3.80 - 5.20 MIL/uL   Hemoglobin 16.9 (H) 12.0 - 16.0 g/dL   HCT 49.9 (H) 35.0 - 47.0 %   MCV 85.4 80.0 - 100.0 fL   MCH 29.0 26.0 - 34.0 pg   MCHC 34.0 32.0 - 36.0 g/dL   RDW 14.6 (H) 11.5 - 14.5 %   Platelets 293 150 - 440 K/uL  Urine Drug Screen, Qualitative     Status: Abnormal   Collection Time: 12/26/16  5:56 PM  Result Value Ref Range   Tricyclic, Ur Screen POSITIVE (A) NONE DETECTED   Amphetamines, Ur Screen NONE DETECTED NONE DETECTED   MDMA (Ecstasy)Ur Screen NONE DETECTED NONE DETECTED   Cocaine Metabolite,Ur [place] NONE DETECTED NONE DETECTED   Opiate, Ur Screen NONE DETECTED NONE DETECTED   Phencyclidine (PCP) Ur S NONE DETECTED NONE DETECTED   Cannabinoid 50 Ng, Ur [place] NONE DETECTED NONE DETECTED   Barbiturates, Ur Screen NONE DETECTED NONE  DETECTED   Benzodiazepine, Ur Scrn NONE DETECTED NONE DETECTED   Methadone Scn, Ur NONE DETECTED NONE DETECTED    Comment: (NOTE) 300  Tricyclics, urine               Cutoff 1000 ng/mL 200  Amphetamines, urine             Cutoff 1000 ng/mL 300  MDMA (Ecstasy), urine           Cutoff 500 ng/mL 400  Cocaine Metabolite, urine       Cutoff 300 ng/mL 500  Opiate, urine                   Cutoff 300 ng/mL 600  Phencyclidine (PCP), urine      Cutoff 25 ng/mL 700  Cannabinoid, urine              Cutoff 50 ng/mL 800  Barbiturates, urine             Cutoff 200 ng/mL 900  Benzodiazepine, urine           Cutoff 200 ng/mL 1000 Methadone, urine                Cutoff 300 ng/mL 1100 1200 The urine drug screen provides only a preliminary, unconfirmed 1300 analytical test result and should not be used for non-medical 1400 purposes. Clinical consideration and professional judgment should 1500 be applied to any positive drug screen result due to possible 1600 interfering substances. A more specific alternate chemical method 1700 must be used in order to obtain a confirmed analytical result.  1800 Gas chromato graphy / mass spectrometry (GC/MS) is the preferred 1900 confirmatory method.     Current Facility-Administered Medications  Medication Dose Route Frequency Provider Last Rate Last Dose  . albuterol (PROVENTIL HFA;VENTOLIN HFA) 108 (90 Base) MCG/ACT inhaler 1-2 puff  1-2 puff Inhalation Daily PRN Nance Pear, MD      . amitriptyline (ELAVIL) tablet 20 mg  20 mg Oral QHS Earleen Newport, MD      . atenolol (TENORMIN) tablet 25 mg  25 mg Oral Daily Earleen Newport, MD   25 mg at 12/27/16 0945  . levothyroxine (SYNTHROID, LEVOTHROID) tablet 112  mcg  112 mcg Oral QAC breakfast Nance Pear, MD   112 mcg at 12/27/16 0914  . LORazepam (ATIVAN) 1 MG tablet           . LORazepam (ATIVAN) 1 MG tablet           . LORazepam (ATIVAN) injection 0-4 mg  0-4 mg Intravenous Q6H Schaevitz, Randall An, MD       Or  . LORazepam (ATIVAN) tablet 0-4 mg  0-4 mg Oral Q6H Schaevitz, Randall An, MD   1 mg at 12/27/16 1448  . [START ON 12/29/2016] LORazepam (ATIVAN) injection 0-4 mg  0-4 mg Intravenous Q12H Orbie Pyo, MD       Or  . Derrill Memo ON 12/29/2016] LORazepam (ATIVAN) tablet 0-4 mg  0-4 mg Oral Q12H Schaevitz, Randall An, MD      . oxyCODONE-acetaminophen (PERCOCET/ROXICET) 5-325 MG per tablet 1 tablet  1 tablet Oral Q6H PRN Nance Pear, MD   1 tablet at 12/27/16 1124  . promethazine (PHENERGAN) tablet 12.5 mg  12.5 mg Oral Q6H PRN Nance Pear, MD   12.5 mg at 12/27/16 1029  . thiamine (VITAMIN B-1) tablet 100 mg  100 mg Oral Daily Orbie Pyo, MD   100 mg at 12/27/16 8101   Or  . thiamine (B-1) injection 100 mg  100 mg Intravenous Daily Orbie Pyo, MD      . tiZANidine (ZANAFLEX) tablet 4 mg  4 mg Oral TID Nance Pear, MD   Stopped at 12/27/16 [phone]  . traZODone (DESYREL) tablet 50 mg  50 mg Oral QHS Nance Pear, MD   50 mg at 12/27/16 0025  . venlafaxine (EFFEXOR) tablet 75 mg  75 mg Oral Daily Nance Pear, MD   75 mg at 12/27/16 0914  . Vitamin D (Ergocalciferol) (DRISDOL) capsule 50,000 Units  50,000 Units Oral Weekly Nance Pear, MD   50,000 Units at 12/27/16 0915   Current Outpatient Prescriptions  Medication Sig Dispense Refill  . albuterol (VENTOLIN HFA) 108 (90 Base) MCG/ACT inhaler Inhale into the lungs.    Marland Kitchen amitriptyline (ELAVIL) 10 MG tablet Take 2 tablets by mouth daily.  0  . atenolol (TENORMIN) 25 MG tablet Take 25 mg by mouth at bedtime.   3  . levothyroxine (SYNTHROID, LEVOTHROID) 112 MCG tablet Take 112 mcg by mouth daily.  0  . oxyCODONE-acetaminophen (PERCOCET/ROXICET) 5-325 MG tablet Take 1 tablet by mouth every 6 (six) hours as needed.  0  . promethazine (PHENERGAN) 12.5 MG tablet Take 1 tablet by mouth every 6 (six) hours as needed.  4  . ranitidine (ZANTAC) 150 MG tablet Take 150 mg by  mouth 2 (two) times daily.    . SYMBICORT 160-4.5 MCG/ACT inhaler Inhale 2 puffs into the lungs 2 (two) times daily.   4  . tiZANidine (ZANAFLEX) 4 MG tablet Take 4 mg by mouth 3 (three) times daily.  0  . venlafaxine (EFFEXOR) 75 MG tablet Take 1 tablet by mouth daily.  0  . Vitamin D, Ergocalciferol, (DRISDOL) 50000 units CAPS capsule Take 1 capsule by mouth once a week.  0    Musculoskeletal: Strength & Muscle Tone: within normal limits Gait & Station: normal Patient leans: N/A  Psychiatric Specialty Exam: Physical Exam  Nursing note and vitals reviewed. Constitutional: She appears well-developed and well-nourished.  HENT:  Head: Normocephalic and atraumatic.  Eyes: Pupils are equal, round, and reactive to light. Conjunctivae are normal.  Neck: Normal range of motion.  Cardiovascular: Regular  rhythm and normal heart sounds.   Respiratory: Effort normal and breath sounds normal. No respiratory distress.  GI: Soft.  Musculoskeletal: Normal range of motion.  Neurological: She is alert.  Skin: Skin is warm and dry.  Psychiatric: Her mood appears anxious. Her speech is delayed. She is slowed. Cognition and memory are impaired. She expresses impulsivity. She exhibits a depressed mood. She expresses suicidal ideation. She expresses no suicidal plans.    Review of Systems  Constitutional: Negative.   HENT: Negative.   Eyes: Negative.   Respiratory: Negative.   Cardiovascular: Negative.   Gastrointestinal: Negative.   Musculoskeletal: Negative.   Skin: Negative.   Neurological: Negative.   Psychiatric/Behavioral: Positive for depression, memory loss, substance abuse and suicidal ideas. Negative for hallucinations. The patient is nervous/anxious and has insomnia.     Blood pressure (!) 141/98, pulse 77, temperature 97.6 F (36.4 C), temperature source Oral, resp. rate 16, height _0  (1.651 m), weight 78.9 kg (174 lb), SpO2 100 %.Body mass index is 28.96 kg/m.  General  Appearance: Casual  Eye Contact:  Fair  Speech:  Clear and Coherent  Volume:  Decreased  Mood:  Depressed  Affect:  Blunt  Thought Process:  Disorganized  Orientation:  Full (Time, Place, and Person)  Thought Content:  Logical and Rumination  Suicidal Thoughts:  Yes.  without intent/plan  Homicidal Thoughts:  No  Memory:  Immediate;   Fair Recent;   Fair Remote;   Fair  Judgement:  Impaired  Insight:  Shallow  Psychomotor Activity:  Decreased  Concentration:  Concentration: Fair  Recall:  [employer] of Knowledge:  Fair  Language:  Fair  Akathisia:  No  Handed:  Right  AIMS (if indicated):     Assets:  Desire for Improvement Financial Resources/Insurance Housing  ADL's:  Intact  Cognition:  WNL  Sleep:        Treatment Plan Summary: Daily contact with patient to assess and evaluate symptoms and progress in treatment, Medication management and Plan Patient is insistent that her suicidal ideation is strong enough that she does not feel safe going home. She is asking for admission to the hospital. We will continue current detox orders and orders for outpatient psychiatric medicine. Patient understands that admission may be delayed but does not feel safe leaving at this time. Continued daily management and observation as she detoxes.  Disposition: Recommend psychiatric Inpatient admission when medically cleared. Supportive therapy provided about ongoing stressors.  Alethia Berthold, MD 12/27/2016 5:44 PM

## 2016-12-27 NOTE — ED Notes (Addendum)
Pt complaining of a headache.  PRN medication administered as ordered. Pain 8/10.  Will continue to monitor.

## 2016-12-28 ENCOUNTER — Inpatient Hospital Stay
Admission: AD | Admit: 2016-12-28 | Discharge: 2017-01-07 | DRG: 885 | Disposition: A | Discharge status: Home / Self Care | Payer: BLUE CROSS/BLUE SHIELD | Source: Intra-hospital | Attending: Psychiatry | Admitting: Psychiatry

## 2016-12-28 ENCOUNTER — Encounter: Payer: Self-pay | Admitting: Psychiatry

## 2016-12-28 DIAGNOSIS — F1994 Other psychoactive substance use, unspecified with psychoactive substance-induced mood disorder: Secondary | ICD-10-CM | POA: Diagnosis present

## 2016-12-28 DIAGNOSIS — Z7951 Long term (current) use of inhaled steroids: Secondary | ICD-10-CM

## 2016-12-28 DIAGNOSIS — I10 Essential (primary) hypertension: Secondary | ICD-10-CM | POA: Diagnosis present

## 2016-12-28 DIAGNOSIS — E785 Hyperlipidemia, unspecified: Secondary | ICD-10-CM | POA: Diagnosis present

## 2016-12-28 DIAGNOSIS — Z87891 Personal history of nicotine dependence: Secondary | ICD-10-CM | POA: Diagnosis not present

## 2016-12-28 DIAGNOSIS — E559 Vitamin D deficiency, unspecified: Secondary | ICD-10-CM | POA: Diagnosis present

## 2016-12-28 DIAGNOSIS — J449 Chronic obstructive pulmonary disease, unspecified: Secondary | ICD-10-CM | POA: Diagnosis present

## 2016-12-28 DIAGNOSIS — F1020 Alcohol dependence, uncomplicated: Secondary | ICD-10-CM | POA: Diagnosis present

## 2016-12-28 DIAGNOSIS — F339 Major depressive disorder, recurrent, unspecified: Secondary | ICD-10-CM | POA: Diagnosis not present

## 2016-12-28 DIAGNOSIS — J441 Chronic obstructive pulmonary disease with (acute) exacerbation: Secondary | ICD-10-CM | POA: Diagnosis present

## 2016-12-28 DIAGNOSIS — K5900 Constipation, unspecified: Secondary | ICD-10-CM | POA: Diagnosis present

## 2016-12-28 DIAGNOSIS — F4310 Post-traumatic stress disorder, unspecified: Secondary | ICD-10-CM | POA: Diagnosis present

## 2016-12-28 DIAGNOSIS — Z90710 Acquired absence of both cervix and uterus: Secondary | ICD-10-CM | POA: Diagnosis not present

## 2016-12-28 DIAGNOSIS — K2920 Alcoholic gastritis without bleeding: Secondary | ICD-10-CM | POA: Diagnosis present

## 2016-12-28 DIAGNOSIS — R51 Headache: Secondary | ICD-10-CM | POA: Diagnosis present

## 2016-12-28 DIAGNOSIS — F332 Major depressive disorder, recurrent severe without psychotic features: Secondary | ICD-10-CM | POA: Diagnosis present

## 2016-12-28 DIAGNOSIS — E890 Postprocedural hypothyroidism: Secondary | ICD-10-CM | POA: Diagnosis present

## 2016-12-28 DIAGNOSIS — R45851 Suicidal ideations: Secondary | ICD-10-CM | POA: Diagnosis present

## 2016-12-28 DIAGNOSIS — Z818 Family history of other mental and behavioral disorders: Secondary | ICD-10-CM

## 2016-12-28 DIAGNOSIS — F1091 Alcohol use, unspecified, in remission: Secondary | ICD-10-CM | POA: Diagnosis present

## 2016-12-28 DIAGNOSIS — R7303 Prediabetes: Secondary | ICD-10-CM | POA: Diagnosis present

## 2016-12-28 DIAGNOSIS — F411 Generalized anxiety disorder: Secondary | ICD-10-CM | POA: Diagnosis present

## 2016-12-28 DIAGNOSIS — E039 Hypothyroidism, unspecified: Secondary | ICD-10-CM | POA: Diagnosis present

## 2016-12-28 DIAGNOSIS — G4700 Insomnia, unspecified: Secondary | ICD-10-CM | POA: Diagnosis present

## 2016-12-28 DIAGNOSIS — F329 Major depressive disorder, single episode, unspecified: Secondary | ICD-10-CM | POA: Diagnosis not present

## 2016-12-28 MED ORDER — LORAZEPAM 1 MG PO TABS
ORAL_TABLET | ORAL | Status: AC
  Administered 2016-12-28: 1 mg via ORAL
  Filled 2016-12-28: qty 1

## 2016-12-28 MED ORDER — LEVOTHYROXINE SODIUM 112 MCG PO TABS
112.0000 ug | ORAL_TABLET | Freq: Every day | ORAL | Status: DC
  Administered 2016-12-29 – 2017-01-07 (×10): 112 ug via ORAL
  Filled 2016-12-28 (×11): qty 1

## 2016-12-28 MED ORDER — ACETAMINOPHEN 325 MG PO TABS
650.0000 mg | ORAL_TABLET | Freq: Four times a day (QID) | ORAL | Status: DC | PRN
Start: 2016-12-28 — End: 2017-01-05
  Administered 2016-12-29 – 2017-01-05 (×10): 650 mg via ORAL
  Filled 2016-12-28 (×10): qty 2

## 2016-12-28 MED ORDER — VENLAFAXINE HCL 75 MG PO TABS
75.0000 mg | ORAL_TABLET | Freq: Every day | ORAL | Status: DC
  Administered 2016-12-29: 75 mg via ORAL
  Filled 2016-12-28: qty 1

## 2016-12-28 MED ORDER — AMITRIPTYLINE HCL 10 MG PO TABS
20.0000 mg | ORAL_TABLET | Freq: Every day | ORAL | Status: DC
  Administered 2016-12-29 – 2017-01-01 (×4): 20 mg via ORAL
  Filled 2016-12-28 (×4): qty 2

## 2016-12-28 MED ORDER — TRAZODONE HCL 50 MG PO TABS
50.0000 mg | ORAL_TABLET | Freq: Every day | ORAL | Status: DC
  Administered 2016-12-29 – 2017-01-01 (×4): 50 mg via ORAL
  Filled 2016-12-28 (×5): qty 1

## 2016-12-28 MED ORDER — LORAZEPAM 2 MG PO TABS
0.0000 mg | ORAL_TABLET | Freq: Four times a day (QID) | ORAL | Status: DC
  Administered 2016-12-29: 1 mg via ORAL
  Administered 2016-12-29: 2 mg via ORAL
  Filled 2016-12-28 (×2): qty 1

## 2016-12-28 MED ORDER — VITAMIN B-1 100 MG PO TABS
100.0000 mg | ORAL_TABLET | Freq: Every day | ORAL | Status: DC
  Administered 2016-12-29 – 2017-01-07 (×10): 100 mg via ORAL
  Filled 2016-12-28 (×10): qty 1

## 2016-12-28 MED ORDER — LORAZEPAM 2 MG/ML IJ SOLN: 0.0000 mg | Freq: Four times a day (QID) | INTRAMUSCULAR | Status: DC

## 2016-12-28 MED ORDER — ALBUTEROL SULFATE HFA 108 (90 BASE) MCG/ACT IN AERS
1.0000 | INHALATION_SPRAY | Freq: Every day | RESPIRATORY_TRACT | Status: DC | PRN
  Administered 2017-01-01: 2 via RESPIRATORY_TRACT
  Filled 2016-12-28 (×2): qty 6.7

## 2016-12-28 MED ORDER — THIAMINE HCL 100 MG/ML IJ SOLN
100.0000 mg | Freq: Every day | INTRAMUSCULAR | Status: DC
  Filled 2016-12-28 (×10): qty 1

## 2016-12-28 MED ORDER — LORAZEPAM 2 MG/ML IJ SOLN: 0.0000 mg | Freq: Two times a day (BID) | INTRAMUSCULAR | Status: DC

## 2016-12-28 MED ORDER — ALUM & MAG HYDROXIDE-SIMETH 200-200-20 MG/5ML PO SUSP: 30.0000 mL | ORAL | Status: DC | PRN

## 2016-12-28 MED ORDER — LORAZEPAM 2 MG PO TABS
0.0000 mg | ORAL_TABLET | Freq: Two times a day (BID) | ORAL | Status: DC
Start: 2016-12-29 — End: 2016-12-29

## 2016-12-28 MED ORDER — ATENOLOL 25 MG PO TABS
25.0000 mg | ORAL_TABLET | Freq: Every day | ORAL | Status: DC
  Administered 2016-12-29 – 2017-01-07 (×10): 25 mg via ORAL
  Filled 2016-12-28 (×10): qty 1

## 2016-12-28 MED ORDER — VITAMIN D (ERGOCALCIFEROL) 1.25 MG (50000 UNIT) PO CAPS
50000.0000 [IU] | ORAL_CAPSULE | ORAL | Status: DC
  Administered 2017-01-03: 50000 [IU] via ORAL
  Filled 2016-12-28: qty 1

## 2016-12-28 MED ORDER — MAGNESIUM HYDROXIDE 400 MG/5ML PO SUSP: 30.0000 mL | Freq: Every day | ORAL | Status: DC | PRN

## 2016-12-28 NOTE — ED Notes (Signed)
Pt unwilling to sign [facility] consent form. Transportation cancelled. TTs made aware. Psychiatrist will be notified.

## 2016-12-28 NOTE — ED Notes (Signed)
Report given to RN in BMU. Patient aware of transfer to [facility] and is agreeable with treatment plan.

## 2016-12-28 NOTE — ED Notes (Signed)
PT IVC/ PENDING PLACEMENT  

## 2016-12-28 NOTE — ED Notes (Signed)
Pt. Alert and oriented, warm and dry, in no distress. Pt. Denies SI, HI, and AVH. Pt. Encouraged to let nursing staff know of any concerns or needs. 

## 2016-12-28 NOTE — ED Notes (Signed)

## 2016-12-28 NOTE — ED Notes (Signed)
Pt attempting to call her husband throughout the day. No answer or call back. Pt stated her husband and daughters are "fed up" with her.  Pt appears depressed, flat affect. Pt is unsure if she will be welcome in the home upon discharge. Remains calm and cooperative with staff. Maintained on 15 minute checks and observation by security camera for safety.

## 2016-12-28 NOTE — Progress Notes (Signed)
Patient ID: Jenna Tyler, female   DOB: 21-Oct-1965, 51 y.o.   MRN: [id number]

## 2016-12-28 NOTE — ED Notes (Signed)
Patient resting quietly in room. No noted distress or abnormal behaviors noted. Will continue 15 minute checks and observation by security camera for safety. 

## 2016-12-28 NOTE — ED Notes (Signed)
Sandwich and soft drink given.  

## 2016-12-28 NOTE — ED Notes (Signed)
Pt awakened with a headache (5/10). Medication administered as ordered. Pt calm and cooperative. Maintained on 15 minute checks and observation by security camera for safety.

## 2016-12-28 NOTE — ED Provider Notes (Signed)
-----------------------------------------   3:12 PM on 12/28/2016 -----------------------------------------  The patient ended up refusing transfer to [facility].  Dr. Weber Cooks was going to reassess her but right now she is in a holding pattern until a definitive disposition can be established.    ----------------------------------------- 6:56 AM on 12/28/2016 -----------------------------------------   Blood pressure 132/83, pulse 90, temperature 97.8 F (36.6 C), temperature source Oral, resp. rate 16, height 1.651 m (5\' 5" ), weight 78.9 kg (174 lb), SpO2 98 %.  The patient had no acute events since last update.  Calm and cooperative at this time.  Patient has been accepted by Dr. Dareen Piano at [facility], anticipated transfer is later this morning.  She will be reassessed prior to transfer.   Hinda Kehr, MD 12/28/16 [phone]

## 2016-12-28 NOTE — BH Assessment (Signed)
Patient is to be admitted to [place] by Dr. Weber Cooks.  Attending Physician will be Dr. Bary Leriche Patient has been assigned to room 325, by [place].   Intake Paper Work has been signed and placed on patient chart.  ER staff is aware of the admission [facility]  Joycelyn Schmid Patient's Nurse & Genella Rife Patient Access).

## 2016-12-28 NOTE — ED Notes (Signed)
Pt to be transferred to BMU. Pt has signed voluntary consent form.

## 2016-12-28 NOTE — ED Notes (Signed)
PT  VOL/  PENDING  PLACEMENT 

## 2016-12-28 NOTE — Consult Note (Signed)
[facility] Face-to-Face Psychiatry Consult   Reason for Consult:  Consult for 51 year old woman with a history of alcohol abuse and depression who comes to the hospital saying she is having suicidal thoughts. Referring Physician:  Clearnce Hasten Patient Identification: Jenna Tyler MRN:  [id number] Principal Diagnosis: <principal problem not specified> Diagnosis:   Patient Active Problem List   Diagnosis Date Noted  . Alcohol dependence ([place]) [F10.20] 12/27/2016  . Severe recurrent major depression without psychotic features ([place]) [F33.2] 12/27/2016  . Prediabetes [R73.03] 07/07/2015  . Primary hypothyroidism [E03.9] 04/04/2015  . Pre-diabetes [R73.03] 04/04/2015  . Vitamin D deficiency [E55.9] 04/04/2015  . Anxiety [F41.9] 10/12/2014  . Anxiety, generalized [F41.1] 10/12/2014  . H/O: hypothyroidism [Z86.39] 10/12/2014  . Insomnia, persistent [G47.00] 10/12/2014  . Cannot sleep [G47.00] 10/12/2014  . Depression, major, recurrent, moderate ([place]) [F33.1] 10/12/2014  . Drug abuse, opioid type [F11.10] 10/12/2014  . 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 Abdominal lipoma [D17.1] 09/15/2013    Total Time spent with patient: 20 minutes  Subjective:   Jenna Tyler is a 51 y.o. female patient admitted with "I'm having suicidal thoughts off and on".   Follow-up note for 51 year old woman with substance abuse and depression. Patient was offered a bed at [facility] last night but refused it. Nevertheless she still can planes of depression and strongly requests admission to the hospital. Although she stayed in bed much of the day today her affect seemed to be significantly brighter. She made some complaints to the nurses requesting more pain medicine. I looked at her controlled substance database and confirm that she really is not on any consistent pain medicine. In fact it looks as though she may have been doing some doctor shopping for smaller doses over the last few  months. Therefore I discontinued all of her narcotics. HPI:  Patient interviewed chart reviewed. 51 year old woman comes to the emergency room reporting that her mood has been getting worse for the last several months. She feels depressed all the time. Energy level is low. Feels jittery. Sleep is impaired. Appetite is poor. She says her alcohol use has been escalating and she is drinking about a 750 mL bottle every 1-2 days. Denies that she is abusing any other drugs. Patient is taking antidepressant medication prescribed by primary care doctor. She says she has had suicidal thoughts with thoughts of overdosing or cutting herself. Has not acted on it. No report of any psychosis. Major stressors from her alcohol abuse and family being unsupportive and angry with her.  Medical history: Patient has hypothyroidism history of hypertension vitamin D deficiency  Social history: Married lives with husband has 2 children at home. Not working outside the home.  Substance abuse history: Patient has a past history of opiate abuse for which she has previously been hospitalized. She says she has managed to stop her abuse of opiates entirely but now she is having a problem drinking that has escalated over the last year. She has had probably some blackouts. No history of seizures or delirium tremens has gone to "a few" AA meetings but hasn't been involved in any substance abuse treatment since starting with the alcohol  Past Psychiatric History: Patient has prior hospitalization here about 4 years ago at which time she was depressed and having problem with opiates. She has been on chronic antidepressants prescribed by primary care doctor. Has a history of a suicide attempt as a teenager. No history of mania or psychosis  Risk to Self:  Risk to Others:   Prior Inpatient Therapy:   Prior Outpatient Therapy:    Past Medical History:  Past Medical History:  Diagnosis Date  . Asthma   . Hyperlipidemia   .  Hypertension   . Hypothyroidism   . 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 Pancreatitis     Past Surgical History:  Procedure Laterality Date  . ABDOMINAL HYSTERECTOMY    . BREAST BIOPSY Right 2012   negative  . CHOLECYSTECTOMY    . 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 OTHER SURGICAL HISTORY     excision of lipoma  . OTHER SURGICAL HISTORY     Abdominal surgery  . THYROID SURGERY     thyroidectomy   Family History:  Family History  Problem Relation Age of Onset  . Breast cancer Sister 45  . Breast cancer Maternal Aunt 31  . Breast cancer Maternal Grandmother 60  . Breast cancer Maternal Aunt 40   Family Psychiatric  History: Positive for depression Social History:  History  Alcohol Use  . 0.0 oz/week    Comment: 2 glasses whiskey per day     History  Drug Use No    Social History   Social History  . Marital status: Married    Spouse name: N/A  . Number of children: N/A  . Years of education: N/A   Social History Main Topics  . Smoking status: Former Research scientist (life sciences)  . Smokeless tobacco: Never Used  . Alcohol use 0.0 oz/week     Comment: 2 glasses whiskey per day  . Drug use: No  . Sexual activity: Yes    Birth control/ protection: Surgical   Other Topics Concern  . Not on file   Social History Narrative  . No narrative on file   Additional Social History:    Allergies:   Allergies  Allergen Reactions  . Nsaids Rash    Internal bleeding  Intestinal bleeding  . Adhesive [Tape] Rash  . Aspirin Rash  . Penicillins Rash    Labs:  No results found for this or any previous visit (from the past 48 hour(s)).  No current facility-administered medications for this encounter.     Musculoskeletal: Strength & Muscle Tone: within normal limits Gait & Station: normal Patient leans: N/A  Psychiatric Specialty Exam: Physical Exam  Nursing note and vitals reviewed. Constitutional: She appears well-developed and well-nourished.  HENT:  Head:  Normocephalic and atraumatic.  Eyes: Pupils are equal, round, and reactive to light. Conjunctivae are normal.  Neck: Normal range of motion.  Cardiovascular: Regular rhythm and normal heart sounds.   Respiratory: Effort normal and breath sounds normal. No respiratory distress.  GI: Soft.  Musculoskeletal: Normal range of motion.  Neurological: She is alert.  Skin: Skin is warm and dry.  Psychiatric: Her mood appears anxious. Her speech is delayed. She is slowed. Cognition and memory are impaired. She expresses impulsivity. She exhibits a depressed mood. She expresses suicidal ideation. She expresses no suicidal plans.    Review of Systems  Constitutional: Negative.   HENT: Negative.   Eyes: Negative.   Respiratory: Negative.   Cardiovascular: Negative.   Gastrointestinal: Negative.   Musculoskeletal: Negative.   Skin: Negative.   Neurological: Negative.   Psychiatric/Behavioral: Positive for depression, memory loss, substance abuse and suicidal ideas. Negative for hallucinations. The patient is nervous/anxious and has insomnia.     Blood pressure (!) 139/92, pulse 74, temperature 98.2 F (36.8 C), temperature source Oral, resp. rate 18, height 5\' 6"  (  1.676 m), weight 76.2 kg (168 lb).Body mass index is 27.12 kg/m.  General Appearance: Casual  Eye Contact:  Fair  Speech:  Clear and Coherent  Volume:  Decreased  Mood:  Depressed  Affect:  Blunt  Thought Process:  Disorganized  Orientation:  Full (Time, Place, and Person)  Thought Content:  Logical and Rumination  Suicidal Thoughts:  Yes.  without intent/plan  Homicidal Thoughts:  No  Memory:  Immediate;   Fair Recent;   Fair Remote;   Fair  Judgement:  Impaired  Insight:  Shallow  Psychomotor Activity:  Decreased  Concentration:  Concentration: Fair  Recall:  [employer] of Knowledge:  Fair  Language:  Fair  Akathisia:  No  Handed:  Right  AIMS (if indicated):     Assets:  Desire for Improvement Financial  Resources/Insurance Housing  ADL's:  Intact  Cognition:  WNL  Sleep:        Treatment Plan Summary: Daily contact with patient to assess and evaluate symptoms and progress in treatment, Medication management and Plan Discontinued narcotics as there is no indication for them. Also discontinued Phenergan has not really indicated and prone to abuse. Patient's vitals are stable and she does not appear to be having significant alcohol withdrawal. Some of her behavior today indicates some serious social problems. Admission orders he accomplished so she can be further evaluated downstairs.  Disposition: Recommend psychiatric Inpatient admission when medically cleared. Supportive therapy provided about ongoing stressors.  Alethia Berthold, MD 12/28/2016 9:46 PM

## 2016-12-28 NOTE — ED Notes (Signed)
Patient asleep in room. No noted distress or abnormal behavior. Will continue 15 minute checks and observation by security cameras for safety. 

## 2016-12-29 DIAGNOSIS — J441 Chronic obstructive pulmonary disease with (acute) exacerbation: Secondary | ICD-10-CM

## 2016-12-29 DIAGNOSIS — F1994 Other psychoactive substance use, unspecified with psychoactive substance-induced mood disorder: Secondary | ICD-10-CM | POA: Diagnosis present

## 2016-12-29 DIAGNOSIS — J449 Chronic obstructive pulmonary disease, unspecified: Secondary | ICD-10-CM | POA: Insufficient documentation

## 2016-12-29 DIAGNOSIS — I10 Essential (primary) hypertension: Secondary | ICD-10-CM | POA: Diagnosis present

## 2016-12-29 DIAGNOSIS — F332 Major depressive disorder, recurrent severe without psychotic features: Principal | ICD-10-CM

## 2016-12-29 HISTORY — DX: Other psychoactive substance use, unspecified with psychoactive substance-induced mood disorder: F19.94

## 2016-12-29 HISTORY — DX: Chronic obstructive pulmonary disease with (acute) exacerbation: J44.1

## 2016-12-29 LAB — LIPID PANEL
Cholesterol: 283 mg/dL — ABNORMAL HIGH (ref 0–200)
HDL: 47 mg/dL (ref >40)
LDL Cholesterol: 202 mg/dL — ABNORMAL HIGH (ref 0–99)
Total CHOL/HDL Ratio: 6 RATIO
Triglycerides: 170 mg/dL — ABNORMAL HIGH (ref <150)
VLDL: 34 mg/dL (ref 0–40)

## 2016-12-29 LAB — TSH: TSH: 3.902 u[IU]/mL (ref 0.350–4.500)

## 2016-12-29 LAB — HEMOGLOBIN A1C
Hgb A1c MFr Bld: 5.7 % — ABNORMAL HIGH (ref 4.8–5.6)
Mean Plasma Glucose: 116.89 mg/dL

## 2016-12-29 LAB — SALICYLATE LEVEL: Salicylate Lvl: <7 mg/dL (ref 2.8–30.0)

## 2016-12-29 LAB — ACETAMINOPHEN LEVEL: Acetaminophen (Tylenol), Serum: <10 ug/mL — ABNORMAL LOW (ref 10–30)

## 2016-12-29 MED ORDER — CHLORDIAZEPOXIDE HCL 25 MG PO CAPS
25.0000 mg | ORAL_CAPSULE | Freq: Four times a day (QID) | ORAL | Status: DC
  Administered 2016-12-29 – 2016-12-30 (×5): 25 mg via ORAL
  Filled 2016-12-29 (×5): qty 1

## 2016-12-29 MED ORDER — BUSPIRONE HCL 5 MG PO TABS
5.0000 mg | ORAL_TABLET | Freq: Two times a day (BID) | ORAL | Status: DC
  Administered 2016-12-29 – 2017-01-06 (×17): 5 mg via ORAL
  Filled 2016-12-29 (×17): qty 1

## 2016-12-29 MED ORDER — TIZANIDINE HCL 4 MG PO TABS
2.0000 mg | ORAL_TABLET | Freq: Three times a day (TID) | ORAL | Status: DC
  Administered 2016-12-29 – 2017-01-02 (×13): 2 mg via ORAL
  Filled 2016-12-29 (×14): qty 1

## 2016-12-29 MED ORDER — VENLAFAXINE HCL ER 75 MG PO CP24
150.0000 mg | ORAL_CAPSULE | Freq: Every day | ORAL | Status: DC
  Administered 2016-12-30 – 2017-01-07 (×9): 150 mg via ORAL
  Filled 2016-12-29 (×9): qty 2

## 2016-12-29 NOTE — Progress Notes (Signed)
Patient is relaxed in bed with eyes closed appear sleeping SIWA score is 0 no distress noted at this time

## 2016-12-29 NOTE — H&P (Signed)
Psychiatric Admission Assessment Adult  Patient Identification: Jenna Tyler MRN:  [id number] Date of Evaluation:  12/29/2016 Chief Complaint:  Major Depressive Disorder Principal Diagnosis: Major depressive disorder, recurrent severe without psychotic features ([place]) Diagnosis:   Patient Active Problem List   Diagnosis Date Noted  . HTN (hypertension) [I10] 12/29/2016  . COPD exacerbation ([place]) [J44.1] 12/29/2016  . Substance induced mood disorder ([place]) [F19.94] 12/29/2016  . Alcohol use disorder, severe, dependence ([place]) [F10.20] 12/27/2016  . Major depressive disorder, recurrent severe without psychotic features ([place]) [F33.2] 12/27/2016  . Prediabetes [R73.03] 07/07/2015  . Primary hypothyroidism [E03.9] 04/04/2015  . Pre-diabetes [R73.03] 04/04/2015  . Vitamin D deficiency [E55.9] 04/04/2015  . Anxiety [F41.9] 10/12/2014  . Anxiety, generalized [F41.1] 10/12/2014  . H/O: hypothyroidism [Z86.39] 10/12/2014  . Insomnia, persistent [G47.00] 10/12/2014  . Cannot sleep [G47.00] 10/12/2014  . Depression, major, recurrent, moderate ([place]) [F33.1] 10/12/2014  . Drug abuse, opioid type [F11.10] 10/12/2014  . 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 Abdominal lipoma [D17.1] 09/15/2013   History of Present Illness:   Identifying data. Ms. Norrington is a 51 year old female With a history of depression and alcoholism.   Chief complaint. "My family is not supportive."   History of present illness. Information was obtained from the patient and the chart. The patient came to the emergency room on 11/25/2016 complaining for worsening of depression and suicidal ideation with a plan to overdose on medications. Has symptoms have been worsening over several months as her drinking escalated. She also started decreasing her dose of Effexor and discontinue BuSpar altogether. She reports poor sleep, decreased appetite, anhedonia, feeling of guilt and hopelessness worthlessness, poor  energy and cons patient, social isolation, crying spells, heightened anxiety that culminated in suicidal thinking. The patient denies finding to hurt herself and came to the hospital instead. She has a history of alcoholism but her drinking escalated in the past 12 months. She reports that recently she has been drinking a fifth of 3 days. This created conflict at home to the point that the patient is no longer sure that she will be allowed to return home with family. She denies psychotic symptoms or symptoms suggestive of bipolar mania. She reports anxiety especially while withdrawing from alcohol. She doesn't use other drugs and was negative for substances on admission but claims that she has been prescribed narcotic painkillers for face pain. Unfortunately her blood alcohol level was not checked on admission.   Past psychiatric history. She was hospitalized several times here in apatient ad7  Similar scenario. She reports that while on BuSpar and Effexor she does better. She had several suicide attempts by overdose when she was a teenager. She has never been in substance abuse treatment.   Family psychiatric history. Her brother is a recovered alcoholic.  Social history. She lives with her husband, the "twins" and a grandchild. She is a stay-at-home and takes care of her grand baby while her daughter goes to college. The situation at home is very tense because of her drinking. She feels that she has no support.  Total Time spent with patient: 1 hour  Is the patient at risk to self? Yes.    Has the patient been a risk to self in the past 6 months? No.  Has the patient been a risk to self within the distant past? Yes.    Is the patient a risk to others? No.  Has the patient been a risk to others in the past 6 months? No.  Has the patient been a risk to others within the distant past? No.   Prior Inpatient Therapy:   Prior Outpatient Therapy:    Alcohol Screening: Patient refused Alcohol Screening  Tool:  (no) 1. How often do you have a drink containing alcohol?: 4 or more times a week 2. How many drinks containing alcohol do you have on a typical day when you are drinking?: 3 or 4 3. How often do you have six or more drinks on one occasion?: Daily or almost daily Preliminary Score: 5 4. How often during the last year have you found that you were not able to stop drinking once you had started?: Daily or almost daily 5. How often during the last year have you failed to do what was normally expected from you becasue of drinking?: Daily or almost daily 6. How often during the last year have you needed a first drink in the morning to get yourself going after a heavy drinking session?: Daily or almost daily 7. How often during the last year have you had a feeling of guilt of remorse after drinking?: Daily or almost daily 8. How often during the last year have you been unable to remember what happened the night before because you had been drinking?: Daily or almost daily 9. Have you or someone else been injured as a result of your drinking?: No 10. Has a relative or friend or a doctor or another health worker been concerned about your drinking or suggested you cut down?: Yes, during the last year Alcohol Use Disorder Identification Test Final Score (AUDIT): 33 Brief Intervention: Yes Substance Abuse History in the last 12 months:  Yes.   Consequences of Substance Abuse: Negative Previous Psychotropic Medications: Yes  Psychological Evaluations: No  Past Medical History:  Past Medical History:  Diagnosis Date  . Asthma   . Hyperlipidemia   . Hypertension   . Hypothyroidism   . 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 Pancreatitis     Past Surgical History:  Procedure Laterality Date  . ABDOMINAL HYSTERECTOMY    . BREAST BIOPSY Right 2012   negative  . CHOLECYSTECTOMY    . 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 OTHER SURGICAL HISTORY     excision of lipoma  . OTHER  SURGICAL HISTORY     Abdominal surgery  . THYROID SURGERY     thyroidectomy   Family History:  Family History  Problem Relation Age of Onset  . Breast cancer Sister 66  . Breast cancer Maternal Aunt 63  . Breast cancer Maternal Grandmother 60  . Breast cancer Maternal Aunt 40    Tobacco Screening: Have you used any form of tobacco in the last 30 days? (Cigarettes, Smokeless Tobacco, Cigars, and/or Pipes): No Social History:  History  Alcohol Use  . 0.0 oz/week    Comment: 2 glasses whiskey per day     History  Drug Use No    Additional Social History:                           Allergies:   Allergies  Allergen Reactions  . Nsaids Rash    Internal bleeding  Intestinal bleeding  . Adhesive [Tape] Rash  . Aspirin Rash  . Penicillins Rash   Lab Results:  Results for orders placed or performed during the hospital encounter of 12/28/16 (from the past 48 hour(s))  Lipid panel     Status: Abnormal   Collection  Time: 12/29/16  7:28 AM  Result Value Ref Range   Cholesterol 283 (H) 0 - 200 mg/dL   Triglycerides 170 (H) <150 mg/dL   HDL 47 >40 mg/dL   Total CHOL/HDL Ratio 6.0 RATIO   VLDL 34 0 - 40 mg/dL   LDL Cholesterol 202 (H) 0 - 99 mg/dL    Comment:        Total Cholesterol/HDL:CHD Risk Coronary Heart Disease Risk Table                     Men   Women  1/2 Average Risk   3.4   3.3  Average Risk       5.0   4.4  2 X Average Risk   9.6   7.1  3 X Average Risk  23.4   11.0        Use the calculated Patient Ratio above and the CHD Risk Table to determine the patient's CHD Risk.        ATP III CLASSIFICATION (LDL):  <100     mg/dL   Optimal  100-129  mg/dL   Near or Above                    Optimal  130-159  mg/dL   Borderline  160-189  mg/dL   High  >190     mg/dL   Very High   TSH     Status: None   Collection Time: 12/29/16  7:28 AM  Result Value Ref Range   TSH 3.902 0.350 - 4.500 uIU/mL    Comment: Performed by a 3rd Generation assay with a  functional sensitivity of <=0.01 uIU/mL.  Salicylate level     Status: None   Collection Time: 12/29/16  7:28 AM  Result Value Ref Range   Salicylate Lvl <4.0 2.8 - 30.0 mg/dL  Acetaminophen level     Status: Abnormal   Collection Time: 12/29/16  7:28 AM  Result Value Ref Range   Acetaminophen (Tylenol), Serum <10 (L) 10 - 30 ug/mL    Comment:        THERAPEUTIC CONCENTRATIONS VARY SIGNIFICANTLY. A RANGE OF 10-30 ug/mL MAY BE AN EFFECTIVE CONCENTRATION FOR MANY PATIENTS. HOWEVER, SOME ARE BEST TREATED AT CONCENTRATIONS OUTSIDE THIS RANGE. ACETAMINOPHEN CONCENTRATIONS >150 ug/mL AT 4 HOURS AFTER INGESTION AND >50 ug/mL AT 12 HOURS AFTER INGESTION ARE OFTEN ASSOCIATED WITH TOXIC REACTIONS.     Blood Alcohol level:  No results found for: [facility]  Metabolic Disorder Labs:  Lab Results  Component Value Date   HGBA1C 5.9 (H) 06/27/2015   MPG 123 (H) 06/27/2015   No results found for: PROLACTIN Lab Results  Component Value Date   CHOL 283 (H) 12/29/2016   TRIG 170 (H) 12/29/2016   HDL 47 12/29/2016   CHOLHDL 6.0 12/29/2016   VLDL 34 12/29/2016   LDLCALC 202 (H) 12/29/2016    Current Medications: Current Facility-Administered Medications  Medication Dose Route Frequency Provider Last Rate Last Dose  . acetaminophen (TYLENOL) tablet 650 mg  650 mg Oral Q6H PRN Clapacs, John T, MD      . albuterol (PROVENTIL HFA;VENTOLIN HFA) 108 (90 Base) MCG/ACT inhaler 1-2 puff  1-2 puff Inhalation Daily PRN Clapacs, John T, MD      . alum & mag hydroxide-simeth (MAALOX/MYLANTA) 200-200-20 MG/5ML suspension 30 mL  30 mL Oral Q4H PRN Clapacs, John T, MD      . amitriptyline (ELAVIL) tablet 20 mg  20 mg  Oral QHS Clapacs, John T, MD      . atenolol (TENORMIN) tablet 25 mg  25 mg Oral Daily Clapacs, Madie Reno, MD   25 mg at 12/29/16 0834  . busPIRone (BUSPAR) tablet 5 mg  5 mg Oral BID Shardea Cwynar B, MD      . chlordiazePOXIDE (LIBRIUM) capsule 25 mg  25 mg Oral QID Branson Kranz B,  MD      . levothyroxine (SYNTHROID, LEVOTHROID) tablet 112 mcg  112 mcg Oral QAC breakfast Clapacs, Madie Reno, MD   112 mcg at 12/29/16 [phone]  . magnesium hydroxide (MILK OF MAGNESIA) suspension 30 mL  30 mL Oral Daily PRN Clapacs, John T, MD      . thiamine (VITAMIN B-1) tablet 100 mg  100 mg Oral Daily Clapacs, John T, MD   100 mg at 12/29/16 7371   Or  . thiamine (B-1) injection 100 mg  100 mg Intravenous Daily Clapacs, John T, MD      . tiZANidine (ZANAFLEX) tablet 2 mg  2 mg Oral TID Arlen Dupuis B, MD      . traZODone (DESYREL) tablet 50 mg  50 mg Oral QHS Clapacs, John T, MD      . Derrill Memo ON 12/30/2016] venlafaxine XR (EFFEXOR-XR) 24 hr capsule 150 mg  150 mg Oral Q breakfast Laiba Fuerte B, MD      . Derrill Memo ON 01/03/2017] Vitamin D (Ergocalciferol) (DRISDOL) capsule 50,000 Units  50,000 Units Oral Weekly Clapacs, Madie Reno, MD       PTA Medications: Prescriptions Prior to Admission  Medication Sig Dispense Refill Last Dose  . albuterol (VENTOLIN HFA) 108 (90 Base) MCG/ACT inhaler Inhale into the lungs.   Taking  . amitriptyline (ELAVIL) 10 MG tablet Take 2 tablets by mouth daily.  0 Not Taking at Unknown time  . atenolol (TENORMIN) 25 MG tablet Take 25 mg by mouth at bedtime.   3 Not Taking at Unknown time  . levothyroxine (SYNTHROID, LEVOTHROID) 112 MCG tablet Take 112 mcg by mouth daily.  0 Not Taking at Unknown time  . oxyCODONE-acetaminophen (PERCOCET/ROXICET) 5-325 MG tablet Take 1 tablet by mouth every 6 (six) hours as needed.  0 Not Taking at Unknown time  . promethazine (PHENERGAN) 12.5 MG tablet Take 1 tablet by mouth every 6 (six) hours as needed.  4 Not Taking at Unknown time  . ranitidine (ZANTAC) 150 MG tablet Take 150 mg by mouth 2 (two) times daily.   Not Taking at Unknown time  . SYMBICORT 160-4.5 MCG/ACT inhaler Inhale 2 puffs into the lungs 2 (two) times daily.   4 Not Taking at Unknown time  . tiZANidine (ZANAFLEX) 4 MG tablet Take 4 mg by mouth 3 (three) times  daily.  0 Not Taking at Unknown time  . venlafaxine (EFFEXOR) 75 MG tablet Take 1 tablet by mouth daily.  0 Not Taking at Unknown time  . Vitamin D, Ergocalciferol, (DRISDOL) 50000 units CAPS capsule Take 1 capsule by mouth once a week.  0 Not Taking at Unknown time    Musculoskeletal: Strength & Muscle Tone: within normal limits Gait & Station: normal Patient leans: N/A  Psychiatric Specialty Exam: Physical Exam  Nursing note and vitals reviewed. Constitutional: She is oriented to person, place, and time. She appears well-developed and well-nourished.  HENT:  Head: Normocephalic and atraumatic.  Eyes: Pupils are equal, round, and reactive to light. Conjunctivae and EOM are normal.  Neck: Normal range of motion. Neck supple.  Cardiovascular: Normal  rate and regular rhythm.   Respiratory: Effort normal and breath sounds normal.  GI: Soft. Bowel sounds are normal.  Musculoskeletal: Normal range of motion.  Neurological: She is alert and oriented to person, place, and time.  Skin: Skin is warm and dry.  Psychiatric: Her speech is normal. Her affect is blunt. She is withdrawn. Cognition and memory are normal. She expresses impulsivity. She exhibits a depressed mood. She expresses suicidal ideation. She expresses suicidal plans.    Review of Systems  Neurological: Positive for headaches.  Psychiatric/Behavioral: Positive for depression, substance abuse and suicidal ideas. The patient has insomnia.   All other systems reviewed and are negative.   Blood pressure (!) 144/95, pulse 90, temperature 97.7 F (36.5 C), temperature source Oral, resp. rate 18, height 5\' 6"  (1.676 m), weight 76.2 kg (168 lb).Body mass index is 27.12 kg/m.  See SRA.                                                  Sleep:  Number of Hours: 5.15    Treatment Plan Summary: Daily contact with patient to assess and evaluate symptoms and progress in treatment and Medication management   Ms.  Muratalla is a 51 year old female with a history of depression and alcoholism admitted for suicidal ideation with a plan to overdose in the context of treatment noncompliance and relapse on alcohol.  1. Suicidal ideation. The patient is able to contract for safety in the hospital.  2. Mood. We will restart Effexor and BuSpar for depression.  3. Alcohol abuse. She was started on CIWA protocol. I will switch it to Librium taper as she is drug seeking.  4. Substance abuse treatment. The patient is somewhat interested in residential treatment. She does have private insurance.   5. Insomnia. Trazodone is available.  6. Headaches. She reportedly takes Elavil and Zanaflex for headache prevention. We will restart them. She also complains of "face pain" from a collision with her medium-size dog and is asking for narcotics. She will not receive any controlled substances beyong alcohol detox.  7. Hypothyroidism. She is on Synthroid.  8. COPD. She is on Albuterol.  9. HTN. She is on Atenolol.  10. Vit D defficiancy. She is on supplement.  11. Disposition. She is uncertain if the family will take her back and requests family meeting.    Observation Level/Precautions:  15 minute checks  Laboratory:  CBC Chemistry Profile UDS UA  Psychotherapy:    Medications:    Consultations:    Discharge Concerns:    Estimated LOS:  Other:     Physician Treatment Plan for Primary Diagnosis: Major depressive disorder, recurrent severe without psychotic features ([place]) Long Term Goal(s): Improvement in symptoms so as ready for discharge  Short Term Goals: Ability to identify changes in lifestyle to reduce recurrence of condition will improve, Ability to verbalize feelings will improve, Ability to disclose and discuss suicidal ideas, Ability to demonstrate self-control will improve, Ability to identify and develop effective coping behaviors will improve, Ability to maintain clinical measurements within normal  limits will improve, Compliance with prescribed medications will improve and Ability to identify triggers associated with substance abuse/mental health issues will improve  Physician Treatment Plan for Secondary Diagnosis: Principal Problem:   Major depressive disorder, recurrent severe without psychotic features ([place]) Active Problems:   Primary hypothyroidism   Vitamin  D deficiency   Alcohol use disorder, severe, dependence ([place])   HTN (hypertension)   COPD exacerbation ([facility])   Substance induced mood disorder ([place])  Long Term Goal(s): Improvement in symptoms so as ready for discharge  Short Term Goals: Ability to identify changes in lifestyle to reduce recurrence of condition will improve, Ability to demonstrate self-control will improve and Ability to identify triggers associated with substance abuse/mental health issues will improve  I certify that inpatient services furnished can reasonably be expected to improve the patient's condition.    Orson Slick, MD 9/8/20189:18 AM

## 2016-12-29 NOTE — Progress Notes (Signed)
Pt denies SI, HI, a/v hallucinations. She is visible on unit and interacts well with staff. Pt is in a pleasant mood. Pt is medication and meal complaint. She commits to safety on unit. Will continue to monitor.

## 2016-12-29 NOTE — Plan of Care (Signed)
Problem: Coping: Goal: Ability to demonstrate self-control will improve Outcome: Not Progressing Patient is communicating care and maintaining ADLs  With out difficulties    Problem: Physical Regulation: Goal: Ability to maintain clinical measurements within normal limits will improve Outcome: Progressing Patient is taking her medications as ordered  Problem: Safety: Goal: Periods of time without injury will increase Outcome: Progressing Patient is with out injury at this time, monitored and kept safe.

## 2016-12-29 NOTE — BHH Group Notes (Signed)
[place] LCSW Group Therapy 12/29/2016 1:15pm  Type of Therapy: Group Therapy- Feelings Around Discharge & Establishing a Supportive Framework  Participation Level:  Did Not Attend  Description of Group:   What is a supportive framework? What does it look like feel like and how do I discern it from and unhealthy non-supportive network? Learn how to cope when supports are not helpful and don't support you. Discuss what to do when your family/friends are not supportive.   Therapeutic Modalities:   Cognitive Behavioral Therapy Person-Centered Therapy Motivational Interviewing   Gladstone Lighter, LCSW 12/29/2016 1:44 PM

## 2016-12-29 NOTE — Plan of Care (Signed)
Problem: Safety: Goal: Periods of time without injury will increase Outcome: Progressing Pt remains safe while in hospital injury free.    

## 2016-12-29 NOTE — BHH Counselor (Signed)
Adult Comprehensive Assessment  Patient ID: Jenna Tyler, female   DOB: Aug 20, 1965, 51 y.o.   MRN: [id number]  Information Source: Information source: Patient  Current Stressors:  Educational / Learning stressors: None reported Employment / Job issues: None reported Family Relationships: strained relationship with husband and children due to alcohol use Financial / Lack of resources (include bankruptcy): None reported Housing / Lack of housing: reports feeling like no one in the house wants to talk to her Physical health (include injuries & life threatening diseases): None reported Social relationships: None reported Substance abuse: daily ETOH use for the past year Bereavement / Loss: father died when Pt was 71; sister (who was her best friend) died 73yrs ago; niece who she was caregiver for after sister died passed away 5 yrs ago after struggling from cerebral palsy  Living/Environment/Situation:  Living Arrangements: Spouse/significant other Living conditions (as described by patient or guardian): lives with husband, 6yo twins, and 47mo grandchild How long has patient lived in current situation?: 28 yrs What is atmosphere in current home: Comfortable  Family History:  Marital status: Married Number of Years Married: 70 What types of issues is patient dealing with in the relationship?: feels that husband is angry with her because of her drinking Does patient have children?: Yes How many children?: 4 How is patient's relationship with their children?: 56yo twins, 67yo daughter, 51yo daughter--okay relationship with 39yo son and 44yo daughter  Childhood History:  By whom was/is the patient raised?: Both parents Additional childhood history information: father died when Pt was 18yo-- had a massive heart attack Description of patient's relationship with caregiver when they were a child: good relationship with children Patient's description of current relationship with people who  raised him/her: good relationship with mother Does patient have siblings?: Yes Number of Siblings: 3 Description of patient's current relationship with siblings: one sister is deceased; good relationship with other two siblings Did patient suffer any verbal/emotional/physical/sexual abuse as a child?: No Did patient suffer from severe childhood neglect?: No Has patient ever been sexually abused/assaulted/raped as an adolescent or adult?: No Was the patient ever a victim of a crime or a disaster?: No Witnessed domestic violence?: No Has patient been effected by domestic violence as an adult?: No  Education:  Highest grade of school patient has completed: 59 Currently a Ship broker?: No Learning disability?: No  Employment/Work Situation:   Employment situation: Unemployed Patient's job has been impacted by current illness: No What is the longest time patient has a held a job?: unknown Where was the patient employed at that time?: unknown Has patient ever been in the [employer]?: No Has patient ever served in combat?: No Did You Receive Any Psychiatric Treatment/Services While in Passenger transport manager?: No Are There Guns or Other Weapons in [place]?: Yes Types of Guns/Weapons: guns Are These Psychologist, educational?: No Who Could Verify You Are Able To Have These Secured:: husband  Museum/gallery curator Resources:   Financial resources: Income from spouse, Private insurance Does patient have a representative payee or guardian?: No  Alcohol/Substance Abuse:   What has been your use of drugs/alcohol within the last 12 months?: 2/5 of vodka over a week; reports has been drinking heavily over the past year If attempted suicide, did drugs/alcohol play a role in this?: No Alcohol/Substance Abuse Treatment Hx: Past Tx, Outpatient If yes, describe treatment: IOP several years ago when she was abusing prescription pills Has alcohol/substance abuse ever caused legal problems?: No  Social Support System:    [employer]  Support System: [employer] System: feels that family is upset with her due to drinking Type of faith/religion: Darrick Meigs How does patient's faith help to cope with current illness?: hope, strength, faith  Leisure/Recreation:   Leisure and Hobbies: holding her grandchild  Strengths/Needs:   What things does the patient do well?: good caregiver In what areas does patient struggle / problems for patient: drinking  Discharge Plan:   Does patient have access to transportation?: Yes Will patient be returning to same living situation after discharge?: Yes Currently receiving community mental health services: No If no, would patient like referral for services when discharged?: Yes (What county?) (either rehab ([place] [facility] [place] or IOP at the [facility]) Does patient have financial barriers related to discharge medications?: No  Summary/Recommendations:     Patient is a 51 year old female with a diagnosis of Alcohol Use Disorder and Major Depressive Disorder. Pt presented to the hospital with suicidal ideations and increased depression in the context of alcohol abuse. Pt reports primary trigger(s) for admission include ongoing alcohol use and feeling unsupported by her family. Patient will benefit from crisis stabilization, medication evaluation, group therapy and psycho education in addition to case management for discharge planning. At discharge it is recommended that Pt remain compliant with established discharge plan and continued treatment.   Gladstone Lighter. 12/29/2016

## 2016-12-29 NOTE — BHH Group Notes (Signed)
[place] Group Notes:  (Nursing/MHT/Case Management/Adjunct)  Date:  12/29/2016  Time:  9:47 PM  Type of Therapy:  Group Therapy  Participation Level:  Active  Participation Quality:  Appropriate  Affect:  Appropriate  Cognitive:  Appropriate  Insight:  Good  Engagement in Group:  Engaged  Modes of Intervention:  Support  Summary of Progress/Problems:  Nehemiah Settle 12/29/2016, 9:47 PM

## 2016-12-29 NOTE — Progress Notes (Signed)
Chaplain received an Order Requisition to see patient. Patient said she did not request a Chaplain visit. Chaplain asked patient how she was and what brought her to the facility. Patient said she had a lot of issues at home with family life. Patient said she had past opioids abuse and her husband has not talked with her since she has been admitted.

## 2016-12-29 NOTE — BHH Suicide Risk Assessment (Signed)
[facility] Admission Suicide Risk Assessment   Nursing information obtained from:  Patient Demographic factors:  Caucasian Current Mental Status:  NA Loss Factors:  NA Historical Factors:  NA Risk Reduction Factors:  Positive therapeutic relationship  Total Time spent with patient: 1 hour Principal Problem: Major depressive disorder, recurrent severe without psychotic features ([place]) Diagnosis:   Patient Active Problem List   Diagnosis Date Noted  . HTN (hypertension) [I10] 12/29/2016  . COPD exacerbation ([place]) [J44.1] 12/29/2016  . Substance induced mood disorder ([place]) [F19.94] 12/29/2016  . Alcohol use disorder, severe, dependence ([place]) [F10.20] 12/27/2016  . Major depressive disorder, recurrent severe without psychotic features ([place]) [F33.2] 12/27/2016  . Prediabetes [R73.03] 07/07/2015  . Primary hypothyroidism [E03.9] 04/04/2015  . Pre-diabetes [R73.03] 04/04/2015  . Vitamin D deficiency [E55.9] 04/04/2015  . Anxiety [F41.9] 10/12/2014  . Anxiety, generalized [F41.1] 10/12/2014  . H/O: hypothyroidism [Z86.39] 10/12/2014  . Insomnia, persistent [G47.00] 10/12/2014  . Cannot sleep [G47.00] 10/12/2014  . Depression, major, recurrent, moderate ([place]) [F33.1] 10/12/2014  . Drug abuse, opioid type [F11.10] 10/12/2014  . 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 Abdominal lipoma [D17.1] 09/15/2013   Subjective Data: suicidal ideation.  Continued Clinical Symptoms:  Alcohol Use Disorder Identification Test Final Score (AUDIT): 33 The "Alcohol Use Disorders Identification Test", Guidelines for Use in Primary Care, Second Edition.  World Pharmacologist [facility]). Score between 0-7:  no or low risk or alcohol related problems. Score between 8-15:  moderate risk of alcohol related problems. Score between 16-19:  high risk of alcohol related problems. Score 20 or above:  warrants further diagnostic evaluation for alcohol dependence and treatment.   CLINICAL FACTORS:    Depression:   Comorbid alcohol abuse/dependence Impulsivity Alcohol/Substance Abuse/Dependencies   Musculoskeletal: Strength & Muscle Tone: within normal limits Gait & Station: normal Patient leans: N/A  Psychiatric Specialty Exam: Physical Exam  Nursing note and vitals reviewed. Psychiatric: Her speech is normal. Her mood appears anxious. She is withdrawn. Cognition and memory are normal. She expresses impulsivity. She exhibits a depressed mood. She expresses suicidal ideation. She expresses suicidal plans.    Review of Systems  Neurological: Positive for headaches.  Psychiatric/Behavioral: Positive for depression, substance abuse and suicidal ideas. The patient has insomnia.   All other systems reviewed and are negative.   Blood pressure (!) 144/95, pulse 90, temperature 97.7 F (36.5 C), temperature source Oral, resp. rate 18, height 5\' 6"  (1.676 m), weight 76.2 kg (168 lb).Body mass index is 27.12 kg/m.  General Appearance: Casual  Eye Contact:  Good  Speech:  Clear and Coherent  Volume:  Normal  Mood:  Anxious and Depressed  Affect:  Flat  Thought Process:  Goal Directed and Descriptions of Associations: Intact  Orientation:  Full (Time, Place, and Person)  Thought Content:  WDL  Suicidal Thoughts:  Yes.  with intent/plan  Homicidal Thoughts:  No  Memory:  Immediate;   Fair Recent;   Fair Remote;   Fair  Judgement:  Poor  Insight:  Lacking  Psychomotor Activity:  Psychomotor Retardation  Concentration:  Concentration: Fair and Attention Span: Fair  Recall:  [employer] of Knowledge:  Fair  Language:  Fair  Akathisia:  No  Handed:  Right  AIMS (if indicated):     Assets:  Communication Skills Desire for Improvement Financial Resources/Insurance Housing Physical Health Resilience Social Support  ADL's:  Intact  Cognition:  WNL  Sleep:  Number of Hours: 5.15      COGNITIVE FEATURES THAT  CONTRIBUTE TO RISK:  None    SUICIDE RISK:   Moderate:   Frequent suicidal ideation with limited intensity, and duration, some specificity in terms of plans, no associated intent, good self-control, limited dysphoria/symptomatology, some risk factors present, and identifiable protective factors, including available and accessible social support.  PLAN OF CARE: hospital admission, medication management, substance abuse counseling, discharge planning.  Ms. Vader is a 51 year old female with a history of depression and alcoholism admitted for suicidal ideation with a plan to overdose in the context of treatment noncompliance and relapse on alcohol.  1. Suicidal ideation. The patient is able to contract for safety in the hospital.  2. Mood. We will restart Effexor and BuSpar for depression.  3. Alcohol abuse. She was started on CIWA protocol. I will switch it to Librium taper as she is drug seeking.  4. Substance abuse treatment. The patient is somewhat interested in residential treatment. She does have private insurance.   5. Insomnia. Trazodone is available.  6. Headaches. She reportedly takes Elavil and Zanaflex for headache prevention. We will restart them. She also complains of "face pain" from a collision with her medium-size dog and is asking for narcotics. She will not receive any controlled substances beyong alcohol detox.  7. Hypothyroidism. She is on Synthroid.  8. COPD. She is on Albuterol.  9. HTN. She is on Atenolol.  10. Vit D defficiancy. She is on supplement.  11. Disposition. She is uncertain if the family will take her back and requests family meeting.     I certify that inpatient services furnished can reasonably be expected to improve the patient's condition.   Orson Slick, MD 12/29/2016, 9:04 AM

## 2016-12-29 NOTE — Tx Team (Signed)
Initial Treatment Plan 12/29/2016 12:37 AM Mauricia Area [id number]    PATIENT STRESSORS: Marital or family conflict Substance abuse   PATIENT STRENGTHS: Average or above average intelligence Supportive family/friends   PATIENT IDENTIFIED PROBLEMS:                      DISCHARGE CRITERIA:  Ability to meet basic life and health needs Motivation to continue treatment in a less acute level of care  PRELIMINARY DISCHARGE PLAN: Attend 12-step recovery group Participate in family therapy  PATIENT/FAMILY INVOLVEMENT: This treatment plan has been presented to and reviewed with the patient, Jenna Tyler. The patient  has been given the opportunity to ask questions and make suggestions.  Clemens Catholic, RN 12/29/2016, 12:37 AM

## 2016-12-30 MED ORDER — PANTOPRAZOLE SODIUM 40 MG PO TBEC
40.0000 mg | DELAYED_RELEASE_TABLET | Freq: Every day | ORAL | Status: DC
  Administered 2016-12-30 – 2017-01-07 (×9): 40 mg via ORAL
  Filled 2016-12-30 (×9): qty 1

## 2016-12-30 MED ORDER — CHLORDIAZEPOXIDE HCL 10 MG PO CAPS
10.0000 mg | ORAL_CAPSULE | Freq: Four times a day (QID) | ORAL | Status: AC
  Administered 2016-12-30 – 2016-12-31 (×7): 10 mg via ORAL
  Filled 2016-12-30 (×7): qty 1

## 2016-12-30 NOTE — Progress Notes (Signed)
[facility] MD Progress Note  12/30/2016 10:56 AM Jenna Tyler  MRN:  [id number]  Subjective:   12/30/2016. Jenna Tyler feels agitated, sweaty, depressed and anxious today. She is no longer suicidal. She tolerates alcohol detox well. Vital signs are stable. She was restarted on her medications of Effexor and Buspar without side effects. She is not interested in residential substance abuse treatment and is unable to afford it. She still owes money to the [employer]. Somehow, she is uncertain if she may return home with her husband. If this is the case, she will stay with her mother in the area. She does not complain of pain today but yesterday when refused narcotics, she asked for Tramadol. The patient is asking about test results for pancreatitis. I see no such results. She does not have any symptoms suggestive of pancreatitis.   Per nursing: Pt denies SI, HI, a/v hallucinations. She is visible on unit and interacts well with staff. Pt is in a pleasant mood. Pt is medication and meal complaint. She commits to safety on unit. Will continue to monitor.   Principal Problem: Major depressive disorder, recurrent severe without psychotic features ([place]) Diagnosis:   Patient Active Problem List   Diagnosis Date Noted  . HTN (hypertension) [I10] 12/29/2016  . COPD exacerbation ([place]) [J44.1] 12/29/2016  . Substance induced mood disorder ([place]) [F19.94] 12/29/2016  . Alcohol use disorder, severe, dependence ([place]) [F10.20] 12/27/2016  . Major depressive disorder, recurrent severe without psychotic features ([place]) [F33.2] 12/27/2016  . Prediabetes [R73.03] 07/07/2015  . Primary hypothyroidism [E03.9] 04/04/2015  . Pre-diabetes [R73.03] 04/04/2015  . Vitamin D deficiency [E55.9] 04/04/2015  . Anxiety [F41.9] 10/12/2014  . Anxiety, generalized [F41.1] 10/12/2014  . H/O: hypothyroidism [Z86.39] 10/12/2014  . Insomnia, persistent [G47.00] 10/12/2014  . Cannot sleep [G47.00] 10/12/2014  .  Depression, major, recurrent, moderate ([place]) [F33.1] 10/12/2014  . Drug abuse, opioid type [F11.10] 10/12/2014  . 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 Abdominal lipoma [D17.1] 09/15/2013   Total Time spent with patient: 30 minutes  Past Psychiatric History: depression, alcoholism.  Past Medical History:  Past Medical History:  Diagnosis Date  . Asthma   . Hyperlipidemia   . Hypertension   . Hypothyroidism   . 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 Pancreatitis     Past Surgical History:  Procedure Laterality Date  . ABDOMINAL HYSTERECTOMY    . BREAST BIOPSY Right 2012   negative  . CHOLECYSTECTOMY    . 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 OTHER SURGICAL HISTORY     excision of lipoma  . OTHER SURGICAL HISTORY     Abdominal surgery  . THYROID SURGERY     thyroidectomy   Family History:  Family History  Problem Relation Age of Onset  . Breast cancer Sister 44  . Breast cancer Maternal Aunt 44  . Breast cancer Maternal Grandmother 60  . Breast cancer Maternal Aunt 29   Family Psychiatric  History: alcoholism. Social History:  History  Alcohol Use  . 0.0 oz/week    Comment: 2 glasses whiskey per day     History  Drug Use No    Social History   Social History  . Marital status: Married    Spouse name: N/A  . Number of children: N/A  . Years of education: N/A   Social History Main Topics  . Smoking status: Former Smoker    Quit date: 12/28/1997  . Smokeless tobacco: Never Used  . Alcohol use  0.0 oz/week     Comment: 2 glasses whiskey per day  . Drug use: No  . Sexual activity: Yes    Birth control/ protection: Surgical   Other Topics Concern  . None   Social History Narrative  . None   Additional Social History:                         Sleep: Fair  Appetite:  Fair  Current Medications: Current Facility-Administered Medications  Medication Dose Route Frequency Provider Last Rate Last Dose  .  acetaminophen (TYLENOL) tablet 650 mg  650 mg Oral Q6H PRN Clapacs, Madie Reno, MD   650 mg at 12/30/16 0759  . albuterol (PROVENTIL HFA;VENTOLIN HFA) 108 (90 Base) MCG/ACT inhaler 1-2 puff  1-2 puff Inhalation Daily PRN Clapacs, John T, MD      . alum & mag hydroxide-simeth (MAALOX/MYLANTA) 200-200-20 MG/5ML suspension 30 mL  30 mL Oral Q4H PRN Clapacs, John T, MD      . amitriptyline (ELAVIL) tablet 20 mg  20 mg Oral QHS Clapacs, Madie Reno, MD   20 mg at 12/29/16 2158  . atenolol (TENORMIN) tablet 25 mg  25 mg Oral Daily Clapacs, Madie Reno, MD   25 mg at 12/30/16 0759  . busPIRone (BUSPAR) tablet 5 mg  5 mg Oral BID Audrey Eller B, MD   5 mg at 12/30/16 0759  . chlordiazePOXIDE (LIBRIUM) capsule 25 mg  25 mg Oral QID Shaterrica Territo B, MD   25 mg at 12/30/16 0759  . levothyroxine (SYNTHROID, LEVOTHROID) tablet 112 mcg  112 mcg Oral QAC breakfast Clapacs, Madie Reno, MD   112 mcg at 12/30/16 [phone]  . magnesium hydroxide (MILK OF MAGNESIA) suspension 30 mL  30 mL Oral Daily PRN Clapacs, John T, MD      . thiamine (VITAMIN B-1) tablet 100 mg  100 mg Oral Daily Clapacs, John T, MD   100 mg at 12/30/16 0759   Or  . thiamine (B-1) injection 100 mg  100 mg Intravenous Daily Clapacs, John T, MD      . tiZANidine (ZANAFLEX) tablet 2 mg  2 mg Oral TID Mccall Will B, MD   2 mg at 12/30/16 0759  . traZODone (DESYREL) tablet 50 mg  50 mg Oral QHS Clapacs, Madie Reno, MD   50 mg at 12/29/16 2156  . venlafaxine XR (EFFEXOR-XR) 24 hr capsule 150 mg  150 mg Oral Q breakfast Dat Derksen B, MD   150 mg at 12/30/16 0800  . [START ON 01/03/2017] Vitamin D (Ergocalciferol) (DRISDOL) capsule 50,000 Units  50,000 Units Oral Weekly Clapacs, Madie Reno, MD        Lab Results:  Results for orders placed or performed during the hospital encounter of 12/28/16 (from the past 48 hour(s))  Hemoglobin A1c     Status: Abnormal   Collection Time: 12/29/16  7:28 AM  Result Value Ref Range   Hgb A1c MFr Bld 5.7 (H) 4.8 - 5.6  %    Comment: (NOTE) Pre diabetes:          5.7%-6.4% Diabetes:              >6.4% Glycemic control for   <7.0% adults with diabetes    Mean Plasma Glucose 116.89 mg/dL    Comment: Performed at [facility], [address] [place], [place] [address]  Lipid panel     Status: Abnormal   Collection Time: 12/29/16  7:28  AM  Result Value Ref Range   Cholesterol 283 (H) 0 - 200 mg/dL   Triglycerides 170 (H) <150 mg/dL   HDL 47 >40 mg/dL   Total CHOL/HDL Ratio 6.0 RATIO   VLDL 34 0 - 40 mg/dL   LDL Cholesterol 202 (H) 0 - 99 mg/dL    Comment:        Total Cholesterol/HDL:CHD Risk Coronary Heart Disease Risk Table                     Men   Women  1/2 Average Risk   3.4   3.3  Average Risk       5.0   4.4  2 X Average Risk   9.6   7.1  3 X Average Risk  23.4   11.0        Use the calculated Patient Ratio above and the CHD Risk Table to determine the patient's CHD Risk.        ATP III CLASSIFICATION (LDL):  <100     mg/dL   Optimal  100-129  mg/dL   Near or Above                    Optimal  130-159  mg/dL   Borderline  160-189  mg/dL   High  >190     mg/dL   Very High   TSH     Status: None   Collection Time: 12/29/16  7:28 AM  Result Value Ref Range   TSH 3.902 0.350 - 4.500 uIU/mL    Comment: Performed by a 3rd Generation assay with a functional sensitivity of <=0.01 uIU/mL.  Salicylate level     Status: None   Collection Time: 12/29/16  7:28 AM  Result Value Ref Range   Salicylate Lvl <1.0 2.8 - 30.0 mg/dL  Acetaminophen level     Status: Abnormal   Collection Time: 12/29/16  7:28 AM  Result Value Ref Range   Acetaminophen (Tylenol), Serum <10 (L) 10 - 30 ug/mL    Comment:        THERAPEUTIC CONCENTRATIONS VARY SIGNIFICANTLY. A RANGE OF 10-30 ug/mL MAY BE AN EFFECTIVE CONCENTRATION FOR MANY PATIENTS. HOWEVER, SOME ARE BEST TREATED AT CONCENTRATIONS OUTSIDE THIS RANGE. ACETAMINOPHEN CONCENTRATIONS >150 ug/mL AT 4 HOURS AFTER INGESTION AND >50 ug/mL AT  12 HOURS AFTER INGESTION ARE OFTEN ASSOCIATED WITH TOXIC REACTIONS.     Blood Alcohol level:  No results found for: [facility]  Metabolic Disorder Labs: Lab Results  Component Value Date   HGBA1C 5.7 (H) 12/29/2016   MPG 116.89 12/29/2016   MPG 123 (H) 06/27/2015   No results found for: PROLACTIN Lab Results  Component Value Date   CHOL 283 (H) 12/29/2016   TRIG 170 (H) 12/29/2016   HDL 47 12/29/2016   CHOLHDL 6.0 12/29/2016   VLDL 34 12/29/2016   LDLCALC 202 (H) 12/29/2016    Physical Findings: AIMS:  , ,  ,  ,    CIWA:  CIWA-Ar Total: 0 COWS:     Musculoskeletal: Strength & Muscle Tone: within normal limits Gait & Station: normal Patient leans: N/A  Psychiatric Specialty Exam: Physical Exam  Nursing note and vitals reviewed. Psychiatric: Her speech is normal. Her mood appears anxious. She is withdrawn. Cognition and memory are normal. She expresses impulsivity. She exhibits a depressed mood. She expresses suicidal ideation. She expresses suicidal plans.    Review of Systems  Neurological: Positive for headaches.  Psychiatric/Behavioral: Positive for depression,  substance abuse and suicidal ideas. The patient has insomnia.   All other systems reviewed and are negative.   Blood pressure (!) 130/95, pulse (!) 115, temperature 97.7 F (36.5 C), temperature source Oral, resp. rate 18, height 5\' 6"  (1.676 m), weight 76.2 kg (168 lb), SpO2 100 %.Body mass index is 27.12 kg/m.  General Appearance: Casual  Eye Contact:  Good  Speech:  Clear and Coherent  Volume:  Normal  Mood:  Anxious and Depressed  Affect:  Blunt  Thought Process:  Goal Directed and Descriptions of Associations: Intact  Orientation:  Full (Time, Place, and Person)  Thought Content:  WDL  Suicidal Thoughts:  Yes.  with intent/plan  Homicidal Thoughts:  No  Memory:  Immediate;   Fair Recent;   Fair Remote;   Fair  Judgement:  Poor  Insight:  Lacking  Psychomotor Activity:  Psychomotor Retardation   Concentration:  Concentration: Fair and Attention Span: Fair  Recall:  [employer] of Knowledge:  Fair  Language:  Fair  Akathisia:  No  Handed:  Right  AIMS (if indicated):     Assets:  Communication Skills Desire for Improvement Financial Resources/Insurance Physical Health Resilience  ADL's:  Intact  Cognition:  WNL  Sleep:  Number of Hours: 6.45     Treatment Plan Summary: Daily contact with patient to assess and evaluate symptoms and progress in treatment and Medication management   Ms. Rice is a 51 year old female with a history of depression and alcoholism admitted for suicidal ideation with a plan to overdose in the context of treatment noncompliance and relapse on alcohol.  1. Suicidal ideation. The patient is able to contract for safety in the hospital.  2. Mood. We restarted Effexor and BuSpar for depression.  3. Alcohol abuse. She is still on Librium taper. I will lower dose to 10 mg.  4. Substance abuse treatment. The patient is not interested in residential treatment or pharmacotherapy for alcoholism.  5. Insomnia. Trazodone is available.  6. Headaches. She reportedly takes Elavil and Zanaflex for headache prevention. We will restart them. She also complains of "face pain" from a collision with her medium-size dog and is asking for narcotics. She will not receive any controlled substances beyong alcohol detox.  7. Hypothyroidism. She is on Synthroid.  8. COPD. She is on Albuterol.  9. HTN. She is on Atenolol.  10. Vit D defficiency. She is on supplement.  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   12. Disposition. She will be discharged with her mother. She requests family meeting.   Orson Slick, MD 12/30/2016, 10:56 AM

## 2016-12-30 NOTE — Plan of Care (Signed)
Problem: Safety: Goal: Ability to remain free from injury will improve Outcome: Progressing Pt safe on the unit at this time   

## 2016-12-30 NOTE — Progress Notes (Signed)
NUTRITION ASSESSMENT  Pt identified as at risk on the Malnutrition Screen Tool  INTERVENTION: 1. Educated patient on the importance of nutrition and encouraged intake of food and beverages. 2. Discussed weight goals. 3. Supplements: N/A  NUTRITION DIAGNOSIS: Unintentional weight loss related to sub-optimal intake as evidenced by pt report.   Goal: Pt to meet >/= 90% of their estimated nutrition needs.  Monitor:  PO intake  Assessment:  51 y.o. female with PMHx of hypothyroidism, HLD, asthma, pancreatitis, HTN, depression, EtOH abuse who was admitted for SI.  Patient reports her appetite has been decreased for the past few weeks. She is not exactly sure why, just does not feel like eating as much. Denies any N/V, abdominal pain, constipation/diarrhea, difficulty chewing/swallowing. She reports PTA she was eating approximately 2 meals per day. She may eat chicken or sandwiches. She has been experiencing early satiety.   Reports UBW was around 175 lbs. She has lost 7 lbs (4% body weight) over 2-3 weeks, which is significant for time frame.   Patient is at risk for malnutrition in setting of reported significant weight loss.  She reports she is eating better here. Noted meal completion of mainly 60-90% (one meal with 40% completion). She does not want any oral nutrition supplements. Discussed obtaining adequate calories and protein through meals and snacks to prevent further unintentional weight loss.  Height: Ht Readings from Last 1 Encounters:  12/28/16 5\' 6"  (1.676 m)    Weight: Wt Readings from Last 1 Encounters:  12/28/16 168 lb (76.2 kg)    Weight Hx: Wt Readings from Last 10 Encounters:  12/28/16 168 lb (76.2 kg)  12/26/16 174 lb (78.9 kg)  08/06/16 178 lb (80.7 kg)  02/14/16 173 lb (78.5 kg)  07/07/15 177 lb (80.3 kg)  04/04/15 183 lb (83 kg)    BMI:  Body mass index is 27.12 kg/m. Pt meets criteria for overweight based on current BMI.  Estimated Nutritional  Needs: Kcal: 25-30 kcal/kg Protein: > 1 gram protein/kg Fluid: 1 ml/kcal  Diet Order: Diet regular Room service appropriate? Yes; Fluid consistency: Thin Pt is also offered choice of unit snacks mid-morning and mid-afternoon.  Pt is eating as desired.   Lab results and medications reviewed.   Willey Blade, MS, RD, LDN Pager: [phone] After Hours Pager: [phone]

## 2016-12-30 NOTE — Plan of Care (Signed)
Problem: Safety: Goal: Ability to remain free from injury will improve Outcome: Progressing Pt remains safe while in hospital injury free.    

## 2016-12-30 NOTE — Progress Notes (Signed)
Pt reports having a depressed mood. She is focused on receiving stronger pain medications. Pt denies current SI, HI, a/v hallucinations. She is cooperative and visible unit. Pt is medication and meal compliant. Will continue to monitor.

## 2016-12-30 NOTE — Progress Notes (Signed)
D: Pt denies SI/HI/AVH. Pt is pleasant and cooperative. Pt seen in the dayroom watching TV, interacting with peers this evening. Pt stated she felt about the same since coming in here.   A: Pt was offered support and encouragement. Pt was given scheduled medications. Pt was encourage to attend groups. Q 15 minute checks were done for safety.   R:Pt attends groups and interacts well with peers and staff. Pt is taking medication. Pt has no complaints.Pt receptive to treatment and safety maintained on unit.

## 2016-12-30 NOTE — BHH Group Notes (Signed)
[place] Group Notes:  (Nursing/MHT/Case Management/Adjunct)  Date:  12/30/2016  Time:  10:17 PM  Type of Therapy:  Group Therapy  Participation Level:  Active  Participation Quality:  Appropriate  Affect:  Appropriate  Cognitive:  Appropriate  Insight:  Appropriate  Engagement in Group:  Engaged  Modes of Intervention:  Discussion  Summary of Progress/Problems:  Jenna Tyler 12/30/2016, 10:17 PM

## 2016-12-30 NOTE — Progress Notes (Signed)
Patient ID: Jenna Tyler, female   DOB: [birth year], 51 y.o.   MRN: [id number] LCSW Group Therapy Note  12/30/2016 1 pm  Type of Therapy and Topic:  Group Therapy:  Cognitive Distortions  Participation Level:  Active   Description of Group:    Patients in this group will be introduced to the topic of cognitive distortions.  Patients will identify and describe cognitive distortions, describe the feelings these distortions create for them.  Patients will identify one or more situations in their personal life where they have cognitively distorted thinking and will verbalize challenging this cognitive distortion through positive thinking skills.  Patients will practice the skill of using positive affirmations to challenge cognitive distortions using affirmation cards.    Therapeutic Goals:  1. Patient will identify two or more cognitive distortions they have used 2. Patient will identify one or more emotions that stem from use of a cognitive distortion 3. Patient will demonstrate use of a positive affirmation to counter a cognitive distortion through discussion and/or role play. 4. Patient will describe one way cognitive distortions can be detrimental to wellness   Summary of Patient Progress:  Pt able to meet above therapeutic goals.  Able to describe how some of her thinking could add increased stress/anxiety/frustration to her life.  Practiced the use of affirmations to combat distorted thought.   Therapeutic Modalities:   Cognitive Behavioral Therapy Motivational Interviewing   August Saucer, LCSW 12/30/2016 4:55 PM

## 2016-12-30 NOTE — Progress Notes (Signed)
Patient ID: Jenna Tyler, female   DOB: January 25, 1966, 51 y.o.   MRN: [id number] [name] indicates that she believes outpatient treatment will be more appropriate as she has a lot of financial concerns at this time.  She remains unsure of where she will live. Will try to reach her husband as she says he is not taking her calls at this time.  She is unsure where she will live as well. Pt open to [place] if she remains in the area.  [name], LCSW

## 2016-12-31 MED ORDER — PRAZOSIN HCL 2 MG PO CAPS
2.0000 mg | ORAL_CAPSULE | Freq: Every day | ORAL | Status: DC
  Administered 2016-12-31 – 2017-01-07 (×8): 2 mg via ORAL
  Filled 2016-12-31 (×8): qty 1

## 2016-12-31 MED ORDER — CHLORPROMAZINE HCL 100 MG PO TABS
50.0000 mg | ORAL_TABLET | Freq: Four times a day (QID) | ORAL | Status: DC | PRN
  Administered 2016-12-31 – 2017-01-02 (×4): 50 mg via ORAL
  Filled 2016-12-31 (×4): qty 1

## 2016-12-31 NOTE — [facility] Group Notes (Signed)
[facility] Group Notes:  (Nursing/MHT/Case Management/Adjunct)  Date:  12/31/2016  Time:  10:32 PM  Type of Therapy:  Group Therapy  Participation Level:  Active  Participation Quality:  Appropriate  Affect:  Appropriate  Cognitive:  Appropriate  Insight:  Appropriate  Engagement in Group:  Engaged  Modes of Intervention:  Discussion  Summary of Progress/Problems:  Jenna Tyler 12/31/2016, 10:32 PM

## 2016-12-31 NOTE — Plan of Care (Signed)
Problem: Education: Goal: Mental status will improve Outcome: Progressing Patient has shown improvement in her mental status since admission.  Problem: Coping: Goal: Ability to verbalize frustrations and anger appropriately will improve Outcome: Progressing Patient is able to verbalize frustrations to staff. Goal: Ability to demonstrate self-control will improve Outcome: Progressing Patient has the ability to verbalize self control.  Problem: Safety: Goal: Periods of time without injury will increase Outcome: Progressing Patient remains free from injury at this time.

## 2016-12-31 NOTE — Tx Team (Signed)
Interdisciplinary Treatment and Diagnostic Plan Update  12/31/2016 Time of Session: [place] MRN: [id number]  Principal Diagnosis: Major depressive disorder, recurrent severe without psychotic features ([place])  Secondary Diagnoses: Principal Problem:   Major depressive disorder, recurrent severe without psychotic features ([place]) Active Problems:   Primary hypothyroidism   Vitamin D deficiency   Alcohol use disorder, severe, dependence ([place])   HTN (hypertension)   COPD exacerbation ([place])   Substance induced mood disorder ([place])   Current Medications:  Current Facility-Administered Medications  Medication Dose Route Frequency Provider Last Rate Last Dose  . acetaminophen (TYLENOL) tablet 650 mg  650 mg Oral Q6H PRN Clapacs, Madie Reno, MD   650 mg at 12/30/16 1842  . albuterol (PROVENTIL HFA;VENTOLIN HFA) 108 (90 Base) MCG/ACT inhaler 1-2 puff  1-2 puff Inhalation Daily PRN Clapacs, John T, MD      . alum & mag hydroxide-simeth (MAALOX/MYLANTA) 200-200-20 MG/5ML suspension 30 mL  30 mL Oral Q4H PRN Clapacs, John T, MD      . amitriptyline (ELAVIL) tablet 20 mg  20 mg Oral QHS Clapacs, Madie Reno, MD   20 mg at 12/30/16 2138  . atenolol (TENORMIN) tablet 25 mg  25 mg Oral Daily Clapacs, Madie Reno, MD   25 mg at 12/31/16 0811  . busPIRone (BUSPAR) tablet 5 mg  5 mg Oral BID Pucilowska, Jolanta B, MD   5 mg at 12/31/16 0810  . chlordiazePOXIDE (LIBRIUM) capsule 10 mg  10 mg Oral QID Pucilowska, Jolanta B, MD   10 mg at 12/31/16 1210  . levothyroxine (SYNTHROID, LEVOTHROID) tablet 112 mcg  112 mcg Oral QAC breakfast Clapacs, Madie Reno, MD   112 mcg at 12/31/16 0631  . magnesium hydroxide (MILK OF MAGNESIA) suspension 30 mL  30 mL Oral Daily PRN Clapacs, John T, MD      . pantoprazole (PROTONIX) EC tablet 40 mg  40 mg Oral Daily Pucilowska, Jolanta B, MD   40 mg at 12/31/16 0810  . thiamine (VITAMIN B-1) tablet 100 mg  100 mg Oral Daily Clapacs, John T, MD   100 mg at 12/31/16 0810   Or  .  thiamine (B-1) injection 100 mg  100 mg Intravenous Daily Clapacs, John T, MD      . tiZANidine (ZANAFLEX) tablet 2 mg  2 mg Oral TID Pucilowska, Jolanta B, MD   2 mg at 12/31/16 1210  . traZODone (DESYREL) tablet 50 mg  50 mg Oral QHS Clapacs, Madie Reno, MD   50 mg at 12/30/16 2138  . venlafaxine XR (EFFEXOR-XR) 24 hr capsule 150 mg  150 mg Oral Q breakfast Pucilowska, Jolanta B, MD   150 mg at 12/31/16 0810  . [START ON 01/03/2017] Vitamin D (Ergocalciferol) (DRISDOL) capsule 50,000 Units  50,000 Units Oral Weekly Clapacs, Madie Reno, MD       PTA Medications: Prescriptions Prior to Admission  Medication Sig Dispense Refill Last Dose  . albuterol (VENTOLIN HFA) 108 (90 Base) MCG/ACT inhaler Inhale into the lungs.   Taking  . amitriptyline (ELAVIL) 10 MG tablet Take 2 tablets by mouth daily.  0 Not Taking at Unknown time  . atenolol (TENORMIN) 25 MG tablet Take 25 mg by mouth at bedtime.   3 Not Taking at Unknown time  . levothyroxine (SYNTHROID, LEVOTHROID) 112 MCG tablet Take 112 mcg by mouth daily.  0 Not Taking at Unknown time  . oxyCODONE-acetaminophen (PERCOCET/ROXICET) 5-325 MG tablet Take 1 tablet by mouth every 6 (six) hours as needed.  0 Not Taking at Unknown time  . promethazine (PHENERGAN) 12.5 MG tablet Take 1 tablet by mouth every 6 (six) hours as needed.  4 Not Taking at Unknown time  . ranitidine (ZANTAC) 150 MG tablet Take 150 mg by mouth 2 (two) times daily.   Not Taking at Unknown time  . SYMBICORT 160-4.5 MCG/ACT inhaler Inhale 2 puffs into the lungs 2 (two) times daily.   4 Not Taking at Unknown time  . tiZANidine (ZANAFLEX) 4 MG tablet Take 4 mg by mouth 3 (three) times daily.  0 Not Taking at Unknown time  . venlafaxine (EFFEXOR) 75 MG tablet Take 1 tablet by mouth daily.  0 Not Taking at Unknown time  . Vitamin D, Ergocalciferol, (DRISDOL) 50000 units CAPS capsule Take 1 capsule by mouth once a week.  0 Not Taking at Unknown time    Patient Stressors: Marital or family  conflict Substance abuse  Patient Strengths: Average or above average intelligence Supportive family/friends  Treatment Modalities: Medication Management, Group therapy, Case management,  1 to 1 session with clinician, Psychoeducation, Recreational therapy.   Physician Treatment Plan for Primary Diagnosis: Major depressive disorder, recurrent severe without psychotic features ([place]) Long Term Goal(s): Improvement in symptoms so as ready for discharge Improvement in symptoms so as ready for discharge   Short Term Goals: Ability to identify changes in lifestyle to reduce recurrence of condition will improve Ability to verbalize feelings will improve Ability to disclose and discuss suicidal ideas Ability to demonstrate self-control will improve Ability to identify and develop effective coping behaviors will improve Ability to maintain clinical measurements within normal limits will improve Compliance with prescribed medications will improve Ability to identify triggers associated with substance abuse/mental health issues will improve Ability to identify changes in lifestyle to reduce recurrence of condition will improve Ability to demonstrate self-control will improve Ability to identify triggers associated with substance abuse/mental health issues will improve  Medication Management: Evaluate patient's response, side effects, and tolerance of medication regimen.  Therapeutic Interventions: 1 to 1 sessions, Unit Group sessions and Medication administration.  Evaluation of Outcomes: Progressing  Physician Treatment Plan for Secondary Diagnosis: Principal Problem:   Major depressive disorder, recurrent severe without psychotic features ([place]) Active Problems:   Primary hypothyroidism   Vitamin D deficiency   Alcohol use disorder, severe, dependence ([place])   HTN (hypertension)   COPD exacerbation ([facility])   Substance induced mood disorder ([place])  Long Term Goal(s): Improvement in  symptoms so as ready for discharge Improvement in symptoms so as ready for discharge   Short Term Goals: Ability to identify changes in lifestyle to reduce recurrence of condition will improve Ability to verbalize feelings will improve Ability to disclose and discuss suicidal ideas Ability to demonstrate self-control will improve Ability to identify and develop effective coping behaviors will improve Ability to maintain clinical measurements within normal limits will improve Compliance with prescribed medications will improve Ability to identify triggers associated with substance abuse/mental health issues will improve Ability to identify changes in lifestyle to reduce recurrence of condition will improve Ability to demonstrate self-control will improve Ability to identify triggers associated with substance abuse/mental health issues will improve     Medication Management: Evaluate patient's response, side effects, and tolerance of medication regimen.  Therapeutic Interventions: 1 to 1 sessions, Unit Group sessions and Medication administration.  Evaluation of Outcomes: Progressing   RN Treatment Plan for Primary Diagnosis: Major depressive disorder, recurrent severe without psychotic features ([place]) Long Term Goal(s): Knowledge of disease and  therapeutic regimen to maintain health will improve  Short Term Goals: Ability to verbalize feelings will improve, Ability to disclose and discuss suicidal ideas, Ability to identify and develop effective coping behaviors will improve and Compliance with prescribed medications will improve  Medication Management: RN will administer medications as ordered by provider, will assess and evaluate patient's response and provide education to patient for prescribed medication. RN will report any adverse and/or side effects to prescribing provider.  Therapeutic Interventions: 1 on 1 counseling sessions, Psychoeducation, Medication administration, Evaluate  responses to treatment, Monitor vital signs and CBGs as ordered, Perform/monitor CIWA, COWS, AIMS and Fall Risk screenings as ordered, Perform wound care treatments as ordered.  Evaluation of Outcomes: Progressing   LCSW Treatment Plan for Primary Diagnosis: Major depressive disorder, recurrent severe without psychotic features ([place]) Long Term Goal(s): Safe transition to appropriate next level of care at discharge, Engage patient in therapeutic group addressing interpersonal concerns.  Short Term Goals: Engage patient in aftercare planning with referrals and resources and Increase social support  Therapeutic Interventions: Assess for all discharge needs, 1 to 1 time with Social worker, Explore available resources and support systems, Assess for adequacy in community support network, Educate family and significant other(s) on suicide prevention, Complete Psychosocial Assessment, Interpersonal group therapy.  Evaluation of Outcomes: Progressing   Progress in Treatment: Attending groups: Yes. Participating in groups: Yes. Taking medication as prescribed: Yes. Toleration medication: Yes. Family/Significant other contact made: No, will contact:  when given permission Patient understands diagnosis: Yes Discussing patient identified problems/goals with staff: Yes. Medical problems stabilized or resolved: Yes. Denies suicidal/homicidal ideation: Yes. Issues/concerns per patient self-inventory: No. Other: none   New problem(s) identified: No, Describe:  none  New Short Term/Long Term Goal(s):Pt goal: "to not want to die."  Discharge Plan or Barriers: CSW assessing for appropriate plan.  Reason for Continuation of Hospitalization: Depression Medication stabilization  Estimated Length of Stay:2-3 days.  Attendees: Patient:Jenna Tyler 12/31/2016   Physician: Dr. Bary Leriche, MD 12/31/2016   Nursing: Floyde Parkins, RN 12/31/2016   RN Care Manager: 12/31/2016   Social Worker: Lurline Idol,  LCSW 12/31/2016   Recreational Therapist:  12/31/2016   Other:  12/31/2016   Other:  12/31/2016   Other: 12/31/2016        Scribe for Treatment Team: Joanne Chars, [place] 12/31/2016 2:09 PM

## 2016-12-31 NOTE — Progress Notes (Signed)
Pt denies SI/HI/AVH, up ambulating throughout shift with steady gait. Patient is pleasant and cooperative with treatment care.  Pt appears less anxious and she is interacting with peers and staff appropriately. Attends meals and groups and engages in sessions, expressed concern regarding discharge, MD made aware of one to one conference request regarding discharge. Denies SI but endorses some depression. Patient will be monitored and physician notified of any acute changes.

## 2016-12-31 NOTE — Progress Notes (Signed)
[facility] MD Progress Note  12/31/2016 3:31 PM Jenna Tyler  MRN:  [id number]  Subjective:   12/30/2016. Jenna Tyler feels agitated, sweaty, depressed and anxious today. She is no longer suicidal. She tolerates alcohol detox well. Vital signs are stable. She was restarted on her medications of Effexor and Buspar without side effects. She is not interested in residential substance abuse treatment and is unable to afford it. She still owes money to the [employer]. Somehow, she is uncertain if she may return home with her husband. If this is the case, she will stay with her mother in the area. She does not complain of pain today but yesterday when refused narcotics, she asked for Tramadol. The patient is asking about test results for pancreatitis. I see no such results. She does not have any symptoms suggestive of pancreatitis.   12/31/2016. Jenna Tyler met with treatment team today. She is extremely anxious as her husband would not answer her calls and she is afraid, she is not welcome back. They have been married for 28 years. She threatens suicide if discharged. She now discloses that in addition to alcohol, she obtained a prescription of painkillers from her PCP which angered her family. There is a history of opioid abuse and treatment at the [facility] several years ago. She is interested in outpatient treatment but lives in [place] and has no transportation. She started going to [employer] and is ready to take Antabuse at discharge.  Per nursing: D: Pt denies SI/HI/AVH. Pt is pleasant and cooperative. Pt seen in the dayroom watching TV, interacting with peers this evening. Pt stated she felt about the same since coming in here.   A: Pt was offered support and encouragement. Pt was given scheduled medications. Pt was encourage to attend groups. Q 15 minute checks were done for safety.   R:Pt attends groups and interacts well with peers and staff. Pt is taking medication.  Pt has no complaints.Pt receptive to treatment and safety maintained on unit.  Principal Problem: Major depressive disorder, recurrent severe without psychotic features ([place]) Diagnosis:   Patient Active Problem List   Diagnosis Date Noted  . HTN (hypertension) [I10] 12/29/2016  . COPD exacerbation ([place]) [J44.1] 12/29/2016  . Substance induced mood disorder ([place]) [F19.94] 12/29/2016  . Alcohol use disorder, severe, dependence ([place]) [F10.20] 12/27/2016  . Major depressive disorder, recurrent severe without psychotic features ([place]) [F33.2] 12/27/2016  . Prediabetes [R73.03] 07/07/2015  . Primary hypothyroidism [E03.9] 04/04/2015  . Pre-diabetes [R73.03] 04/04/2015  . Vitamin D deficiency [E55.9] 04/04/2015  . Anxiety [F41.9] 10/12/2014  . Anxiety, generalized [F41.1] 10/12/2014  . H/O: hypothyroidism [Z86.39] 10/12/2014  . Insomnia, persistent [G47.00] 10/12/2014  . Cannot sleep [G47.00] 10/12/2014  . Depression, major, recurrent, moderate ([place]) [F33.1] 10/12/2014  . Drug abuse, opioid type [F11.10] 10/12/2014  . 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 Abdominal lipoma [D17.1] 09/15/2013   Total Time spent with patient: 30 minutes  Past Psychiatric History: depression, alcoholism.  Past Medical History:  Past Medical History:  Diagnosis Date  . Asthma   . Hyperlipidemia   . Hypertension   . Hypothyroidism   . 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 Pancreatitis     Past Surgical History:  Procedure Laterality Date  . ABDOMINAL HYSTERECTOMY    . BREAST BIOPSY Right 2012   negative  . CHOLECYSTECTOMY    . 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 OTHER SURGICAL HISTORY     excision of  lipoma  . OTHER SURGICAL HISTORY     Abdominal surgery  . THYROID SURGERY     thyroidectomy   Family History:  Family History  Problem Relation Age of Onset  . Breast cancer Sister 83  . Breast cancer Maternal Aunt 15  . Breast cancer Maternal Grandmother  60  . Breast cancer Maternal Aunt 83   Family Psychiatric  History: alcoholism. Social History:  History  Alcohol Use  . 0.0 oz/week    Comment: 2 glasses whiskey per day     History  Drug Use No    Social History   Social History  . Marital status: Married    Spouse name: N/A  . Number of children: N/A  . Years of education: N/A   Social History Main Topics  . Smoking status: Former Smoker    Quit date: 12/28/1997  . Smokeless tobacco: Never Used  . Alcohol use 0.0 oz/week     Comment: 2 glasses whiskey per day  . Drug use: No  . Sexual activity: Yes    Birth control/ protection: Surgical   Other Topics Concern  . None   Social History Narrative  . None   Additional Social History:                         Sleep: Fair  Appetite:  Fair  Current Medications: Current Facility-Administered Medications  Medication Dose Route Frequency Provider Last Rate Last Dose  . acetaminophen (TYLENOL) tablet 650 mg  650 mg Oral Q6H PRN Clapacs, Madie Reno, MD   650 mg at 12/30/16 1842  . albuterol (PROVENTIL HFA;VENTOLIN HFA) 108 (90 Base) MCG/ACT inhaler 1-2 puff  1-2 puff Inhalation Daily PRN Clapacs, John T, MD      . alum & mag hydroxide-simeth (MAALOX/MYLANTA) 200-200-20 MG/5ML suspension 30 mL  30 mL Oral Q4H PRN Clapacs, John T, MD      . amitriptyline (ELAVIL) tablet 20 mg  20 mg Oral QHS Clapacs, Madie Reno, MD   20 mg at 12/30/16 2138  . atenolol (TENORMIN) tablet 25 mg  25 mg Oral Daily Clapacs, Madie Reno, MD   25 mg at 12/31/16 0811  . busPIRone (BUSPAR) tablet 5 mg  5 mg Oral BID Daisie Haft B, MD   5 mg at 12/31/16 0810  . chlordiazePOXIDE (LIBRIUM) capsule 10 mg  10 mg Oral QID Lafayette Dunlevy B, MD   10 mg at 12/31/16 1210  . levothyroxine (SYNTHROID, LEVOTHROID) tablet 112 mcg  112 mcg Oral QAC breakfast Clapacs, Madie Reno, MD   112 mcg at 12/31/16 0631  . magnesium hydroxide (MILK OF MAGNESIA) suspension 30 mL  30 mL Oral Daily PRN Clapacs, John T, MD       . pantoprazole (PROTONIX) EC tablet 40 mg  40 mg Oral Daily Jayland Null B, MD   40 mg at 12/31/16 0810  . thiamine (VITAMIN B-1) tablet 100 mg  100 mg Oral Daily Clapacs, John T, MD   100 mg at 12/31/16 0810   Or  . thiamine (B-1) injection 100 mg  100 mg Intravenous Daily Clapacs, John T, MD      . tiZANidine (ZANAFLEX) tablet 2 mg  2 mg Oral TID Charlestine Rookstool B, MD   2 mg at 12/31/16 1210  . traZODone (DESYREL) tablet 50 mg  50 mg Oral QHS Clapacs, Madie Reno, MD   50 mg at 12/30/16 2138  . venlafaxine XR (EFFEXOR-XR) 24 hr capsule 150 mg  150 mg Oral Q breakfast Farouk Vivero B, MD   150 mg at 12/31/16 0810  . [START ON 01/03/2017] Vitamin D (Ergocalciferol) (DRISDOL) capsule 50,000 Units  50,000 Units Oral Weekly Clapacs, John T, MD        Lab Results:  No results found for this or any previous visit (from the past 48 hour(s)).  Blood Alcohol level:  No results found for: [facility]  Metabolic Disorder Labs: Lab Results  Component Value Date   HGBA1C 5.7 (H) 12/29/2016   MPG 116.89 12/29/2016   MPG 123 (H) 06/27/2015   No results found for: PROLACTIN Lab Results  Component Value Date   CHOL 283 (H) 12/29/2016   TRIG 170 (H) 12/29/2016   HDL 47 12/29/2016   CHOLHDL 6.0 12/29/2016   VLDL 34 12/29/2016   LDLCALC 202 (H) 12/29/2016    Physical Findings: AIMS:  , ,  ,  ,    CIWA:  CIWA-Ar Total: 0 COWS:     Musculoskeletal: Strength & Muscle Tone: within normal limits Gait & Station: normal Patient leans: N/A  Psychiatric Specialty Exam: Physical Exam  Nursing note and vitals reviewed. Psychiatric: Her speech is normal. Her mood appears anxious. She is withdrawn. Cognition and memory are normal. She expresses impulsivity. She exhibits a depressed mood. She expresses suicidal ideation. She expresses suicidal plans.    Review of Systems  Neurological: Positive for headaches.  Psychiatric/Behavioral: Positive for depression, substance abuse and suicidal ideas.  The patient has insomnia.   All other systems reviewed and are negative.   Blood pressure (!) 134/96, pulse 93, temperature 98 F (36.7 C), temperature source Oral, resp. rate 18, height _0  (1.676 m), weight 76.2 kg (168 lb), SpO2 100 %.Body mass index is 27.12 kg/m.  General Appearance: Casual  Eye Contact:  Good  Speech:  Clear and Coherent  Volume:  Normal  Mood:  Anxious and Depressed  Affect:  Blunt  Thought Process:  Goal Directed and Descriptions of Associations: Intact  Orientation:  Full (Time, Place, and Person)  Thought Content:  WDL  Suicidal Thoughts:  Yes.  with intent/plan  Homicidal Thoughts:  No  Memory:  Immediate;   Fair Recent;   Fair Remote;   Fair  Judgement:  Poor  Insight:  Lacking  Psychomotor Activity:  Psychomotor Retardation  Concentration:  Concentration: Fair and Attention Span: Fair  Recall:  [employer] of Knowledge:  Fair  Language:  Fair  Akathisia:  No  Handed:  Right  AIMS (if indicated):     Assets:  Communication Skills Desire for Improvement Financial Resources/Insurance Physical Health Resilience  ADL's:  Intact  Cognition:  WNL  Sleep:  Number of Hours: 7.15     Treatment Plan Summary: Daily contact with patient to assess and evaluate symptoms and progress in treatment and Medication management   Ms. Begnaud is a 51 year old female with a history of depression and alcoholism admitted for suicidal ideation with a plan to overdose in the context of treatment noncompliance and relapse on alcohol.  1. Suicidal ideation. The patient is able to contract for safety in the hospital.  2. Mood. We restarted Effexor and BuSpar for depression.  3. Alcohol abuse. She is still on Librium taper.   4. Substance abuse treatment. The patient is not interested in residential treatment but is interested in pharmacotherapy for alcoholism.  5. Insomnia. Trazodone is available.  6. Headaches. She reportedly takes Elavil and Zanaflex  for headache prevention. We will restart them. She  also complains of "face pain" from a collision with her medium-size dog and is asking for narcotics. She will not receive any controlled substances beyong alcohol detox.  7. Hypothyroidism. She is on Synthroid.  8. COPD. She is on Albuterol.  9. HTN. She is on Atenolol.  10. Vit D defficiency. She is on supplement.  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   12. Disposition. She will be discharged with her mother. She requests family meeting.   Orson Slick, MD 12/31/2016, 3:31 PM

## 2016-12-31 NOTE — Progress Notes (Signed)
Patient ID: Jenna Tyler, female   DOB: August 12, 1965, 51 y.o.   MRN: [id number] CSW left voicemail for Pt's daughter [name] at [phone]. Requested call back from her or Pt's husband if she would please ask him to call as he has not yet answered calls.  [name], LCSW

## 2016-12-31 NOTE — [facility] Group Notes (Signed)
[facility] LCSW Group Therapy Note  Date/Time: 12/31/16, 0930  Type of Therapy and Topic:  Group Therapy:  Overcoming Obstacles  Participation Level:  active  Description of Group:    In this group patients will be encouraged to explore what they see as obstacles to their own wellness and recovery. They will be guided to discuss their thoughts, feelings, and behaviors related to these obstacles. The group will process together ways to cope with barriers, with attention given to specific choices patients can make. Each patient will be challenged to identify changes they are motivated to make in order to overcome their obstacles. This group will be process-oriented, with patients participating in exploration of their own experiences as well as giving and receiving support and challenge from other group members.  Therapeutic Goals: 1. Patient will identify personal and current obstacles as they relate to admission. 2. Patient will identify barriers that currently interfere with their wellness or overcoming obstacles.  3. Patient will identify feelings, thought process and behaviors related to these barriers. 4. Patient will identify two changes they are willing to make to overcome these obstacles:    Summary of Patient Progress: Pt identified alcohol use and addiction as current obstacles in her life.  Pt reports she has been part of NA in the past and recently has attended some AA groups, which has been helpful.  She is also considering SAIOP in order to address her addiction issues.  Pt was attentive in group and showed good participation.      Therapeutic Modalities:   Cognitive Behavioral Therapy Solution Focused Therapy Motivational Interviewing Relapse Prevention Therapy  Lurline Idol, LCSW

## 2016-12-31 NOTE — Progress Notes (Signed)
Patient ID: Jenna Tyler, female   DOB: 22-Apr-1966, 51 y.o.   MRN: [id number] CSW left Voicemail on Pt's home number in an effort to reach Pt's husband to discuss Pt's discharge plans.  Requested return call.  [name], LCSW

## 2016-12-31 NOTE — Plan of Care (Signed)
Problem: Education: Goal: Mental status will improve Outcome: Progressing Patient is communicating accordingly staff team and follows treatment orders   Problem: Coping: Goal: Ability to demonstrate self-control will improve Outcome: Progressing Patient is calm and  non aggressive behaviors noted at this time   Problem: Safety: Goal: Periods of time without injury will increase Outcome: Progressing Patient is safe and secure without any injuries monitored every 15 minutes

## 2017-01-01 LAB — LIPASE, BLOOD: Lipase: 23 U/L (ref 11–51)

## 2017-01-01 MED ORDER — PSEUDOEPHEDRINE HCL ER 120 MG PO TB12
120.0000 mg | ORAL_TABLET | Freq: Two times a day (BID) | ORAL | Status: DC
  Administered 2017-01-01 – 2017-01-07 (×11): 120 mg via ORAL
  Filled 2017-01-01 (×15): qty 1

## 2017-01-01 MED ORDER — QUETIAPINE FUMARATE 100 MG PO TABS
100.0000 mg | ORAL_TABLET | Freq: Once | ORAL | Status: AC
  Administered 2017-01-01: 100 mg via ORAL
  Filled 2017-01-01: qty 1

## 2017-01-01 NOTE — [facility] Group Notes (Signed)
[facility] Group Notes:  (Nursing/MHT/Case Management/Adjunct)  Date:  01/01/2017  Time:  7:24 PM  Type of Therapy:  Psychoeducational Skills  Participation Level:  Active  Participation Quality:  Appropriate and Attentive  Affect:  Appropriate  Cognitive:  Alert and Appropriate  Insight:  Appropriate and Good  Engagement in Group:  Engaged  Modes of Intervention:  Discussion, Education and Socialization  Summary of Progress/Problems:  Jenna Tyler 01/01/2017, 7:24 PM

## 2017-01-01 NOTE — Progress Notes (Addendum)
Patient ID: Jenna Tyler, female   DOB: 08/02/1965, 51 y.o.   MRN: [id number] CSW met with Jenna Tyler, [phone], Pt's daughter to discuss current situation. She verbalizes that she is not willing to have Pt live with her and that her father and sister, whom Pt was living with prior to admission, do not want her to return there either.  CSW validated her feelings about current circumstances as she was tearful and felt guilty for not agreeing to allow her mother to move in. CSW informed her that her only other options would be the shelter or arrangements that the Pt makes on her own with other friends or family.  Informed her that Pt had turned down other opportunities for inpatient treatment.  She verbalizes being willing to meet with her mother but after more discussion says she thinks it may not be the best thing because she doesn't want to be "talked into taking her in"  CSW supported her in having healthy boundaries for herself and her family.    Notified Pt of her family's decision.  She was tearful briefly saying she "guessed her marriage was over"  Also expressed frustration with not being supported.  CSW encouraged her to use this as an opportunity to break the pattern of substance abuse she had been in when living at home by using this opportunity to make a change and prove her commitment to her sobriety through her actions.  Pt verbalizes vague statements about how she "would not feel safe in being discharged now that she knows this"  CSW notifed RN Christy of Pt's statements.  [name], LCSW

## 2017-01-01 NOTE — [facility] Suicide Risk Assessment (Signed)
[facility] INPATIENT:  Family/Significant Other Suicide Prevention Education  Suicide Prevention Education:  Education Completed; Andilynn Delavega, [phone], done in person  (name of family member/significant other) has been identified by the patient as the family member/significant other with whom the patient will be residing, and identified as the person(s) who will aid the patient in the event of a mental health crisis (suicidal ideations/suicide attempt).  With written consent from the patient, the family member/significant other has been provided the following suicide prevention education, prior to the and/or following the discharge of the patient.  The suicide prevention education provided includes the following:  Suicide risk factors  Suicide prevention and interventions  National Suicide Hotline telephone number  [facility] assessment telephone number  [facility] Emergency Assistance [place] and/or Residential Mobile Crisis Unit telephone number  Request made of family/significant other to:  Remove weapons (e.g., guns, rifles, knives), all items previously/currently identified as safety concern.    Remove drugs/medications (over-the-counter, prescriptions, illicit drugs), all items previously/currently identified as a safety concern.  The family member/significant other verbalizes understanding of the suicide prevention education information provided.  The family member/significant other agrees to remove the items of safety concern listed above.  [name], Pt's daughter, says that she is unable to have Pt live with her but is willing to be a resource for her in the event of a crisis she will intervene appropriately*  August Saucer, LCSW 01/01/2017, 11:41 AM

## 2017-01-01 NOTE — [facility] Group Notes (Signed)
[facility] Group Notes:  (Nursing/MHT/Case Management/Adjunct)  Date:  01/01/2017  Time:  9:35 PM  Type of Therapy:  Group Therapy  Participation Level:  Active  Participation Quality:  Appropriate  Affect:  Appropriate  Cognitive:  Alert  Insight:  Good  Engagement in Group:  Engaged  Modes of Intervention:  Support  Summary of Progress/Problems:  Jenna Tyler 01/01/2017, 9:35 PM

## 2017-01-01 NOTE — Progress Notes (Signed)
Patient up on the unit and denies SI, HI, AVH. She is med compliant. She talks about her drinking problem. She states that her family doesn't want her back but the house is in her name too and has option. Writer referred her too social work. She talks to sara case worker about her family and discovered spouse wants a divorce. She is tearful and is given a thorazine prn. She is hopeful about her future and wants options. Will continue to monitor.

## 2017-01-01 NOTE — Progress Notes (Signed)
[facility] MD Progress Note  01/01/2017 9:03 AM Jenna Tyler  MRN:  [id number]  Subjective:   12/30/2016. [name] feels agitated, sweaty, depressed and anxious today. She is no longer suicidal. She tolerates alcohol detox well. Vital signs are stable. She was restarted on her medications of Effexor and Buspar without side effects. She is not interested in residential substance abuse treatment and is unable to afford it. She still owes money to the [employer]. Somehow, she is uncertain if she may return home with her husband. If this is the case, she will stay with her mother in the area. She does not complain of pain today but yesterday when refused narcotics, she asked for Tramadol. The patient is asking about test results for pancreatitis. I see no such results. She does not have any symptoms suggestive of pancreatitis.   12/31/2016. [name] met with treatment team today. She is extremely anxious as her husband would not answer her calls and she is afraid, she is not welcome back. They have been married for 28 years. She threatens suicide if discharged. She now discloses that in addition to alcohol, she obtained a prescription of painkillers from her PCP which angered her family. There is a history of opioid abuse and treatment at the [facility] several years ago. She is interested in outpatient treatment but lives in [place] and has no transportation. She started going to [employer] and is ready to take Antabuse at discharge.  01/01/2017. [name] spoke with her daughter yesterday who informed her that she may not return home due to drinking and prescription pill abuse. The patient got agitated and received prn medication. She tells me today that she went to her room later last night and wrote suicide letter to her mother and wanted to hang herself in the bathroom here. She then talked to her 51-year-old grandson and thought better of it. She denies feeling suicidal  now, is able to contract for safety in the hospital but not if discharged. She wants a family meeting before discharge. She intends to return home against her family wishes. Unfortunately, I do not believe that she is committed to sobriety at this point. She still refuses residential treatment.  Per nursing: Patient is communicating accordingly staff team and follows treatment orders   Principal Problem: Major depressive disorder, recurrent severe without psychotic features ([place]) Diagnosis:   Patient Active Problem List   Diagnosis Date Noted  . HTN (hypertension) [I10] 12/29/2016  . COPD exacerbation ([place]) [J44.1] 12/29/2016  . Substance induced mood disorder ([place]) [F19.94] 12/29/2016  . Alcohol use disorder, severe, dependence ([place]) [F10.20] 12/27/2016  . Major depressive disorder, recurrent severe without psychotic features ([place]) [F33.2] 12/27/2016  . Prediabetes [R73.03] 07/07/2015  . Primary hypothyroidism [E03.9] 04/04/2015  . Pre-diabetes [R73.03] 04/04/2015  . Vitamin D deficiency [E55.9] 04/04/2015  . Anxiety [F41.9] 10/12/2014  . Anxiety, generalized [F41.1] 10/12/2014  . H/O: hypothyroidism [Z86.39] 10/12/2014  . Insomnia, persistent [G47.00] 10/12/2014  . Cannot sleep [G47.00] 10/12/2014  . Depression, major, recurrent, moderate ([place]) [F33.1] 10/12/2014  . Drug abuse, opioid type [F11.10] 10/12/2014  . 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 Abdominal lipoma [D17.1] 09/15/2013   Total Time spent with patient: 30 minutes  Past Psychiatric History: depression, alcoholism.  Past Medical History:  Past Medical History:  Diagnosis Date  . Asthma   . Hyperlipidemia   . Hypertension   . Hypothyroidism   . 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  Pancreatitis     Past Surgical History:  Procedure Laterality Date  . ABDOMINAL HYSTERECTOMY    . BREAST BIOPSY Right 2012   negative  . CHOLECYSTECTOMY    . 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 OTHER SURGICAL HISTORY     excision of lipoma  . OTHER SURGICAL HISTORY     Abdominal surgery  . THYROID SURGERY     thyroidectomy   Family History:  Family History  Problem Relation Age of Onset  . Breast cancer Sister 35  . Breast cancer Maternal Aunt 48  . Breast cancer Maternal Grandmother 60  . Breast cancer Maternal Aunt 32   Family Psychiatric  History: alcoholism. Social History:  History  Alcohol Use  . 0.0 oz/week    Comment: 2 glasses whiskey per day     History  Drug Use No    Social History   Social History  . Marital status: Married    Spouse name: N/A  . Number of children: N/A  . Years of education: N/A   Social History Main Topics  . Smoking status: Former Smoker    Quit date: 12/28/1997  . Smokeless tobacco: Never Used  . Alcohol use 0.0 oz/week     Comment: 2 glasses whiskey per day  . Drug use: No  . Sexual activity: Yes    Birth control/ protection: Surgical   Other Topics Concern  . None   Social History Narrative  . None   Additional Social History:                         Sleep: Fair  Appetite:  Fair  Current Medications: Current Facility-Administered Medications  Medication Dose Route Frequency Provider Last Rate Last Dose  . acetaminophen (TYLENOL) tablet 650 mg  650 mg Oral Q6H PRN Clapacs, Madie Reno, MD   650 mg at 12/30/16 1842  . albuterol (PROVENTIL HFA;VENTOLIN HFA) 108 (90 Base) MCG/ACT inhaler 1-2 puff  1-2 puff Inhalation Daily PRN Clapacs, John T, MD      . alum & mag hydroxide-simeth (MAALOX/MYLANTA) 200-200-20 MG/5ML suspension 30 mL  30 mL Oral Q4H PRN Clapacs, John T, MD      . amitriptyline (ELAVIL) tablet 20 mg  20 mg Oral QHS Clapacs, Madie Reno, MD   20 mg at 12/31/16 2105  . atenolol (TENORMIN) tablet 25 mg  25 mg Oral Daily Clapacs, Madie Reno, MD   25 mg at 01/01/17 0810  . busPIRone (BUSPAR) tablet 5 mg  5 mg Oral BID Kelten Enochs B, MD   5 mg at 01/01/17 0810  . chlorproMAZINE (THORAZINE)  tablet 50 mg  50 mg Oral QID PRN Orla Jolliff B, MD   50 mg at 12/31/16 1857  . levothyroxine (SYNTHROID, LEVOTHROID) tablet 112 mcg  112 mcg Oral QAC breakfast Clapacs, Madie Reno, MD   112 mcg at 01/01/17 0546  . magnesium hydroxide (MILK OF MAGNESIA) suspension 30 mL  30 mL Oral Daily PRN Clapacs, John T, MD      . pantoprazole (PROTONIX) EC tablet 40 mg  40 mg Oral Daily Terik Haughey B, MD   40 mg at 01/01/17 0810  . prazosin (MINIPRESS) capsule 2 mg  2 mg Oral Q breakfast Liann Spaeth B, MD   2 mg at 01/01/17 0810  . pseudoephedrine (SUDAFED) 12 hr tablet 120 mg  120 mg Oral BID Angelena Sand B, MD      . thiamine (VITAMIN B-1) tablet  100 mg  100 mg Oral Daily Clapacs, John T, MD   100 mg at 01/01/17 0810   Or  . thiamine (B-1) injection 100 mg  100 mg Intravenous Daily Clapacs, John T, MD      . tiZANidine (ZANAFLEX) tablet 2 mg  2 mg Oral TID Ina Poupard B, MD   2 mg at 01/01/17 0810  . traZODone (DESYREL) tablet 50 mg  50 mg Oral QHS Clapacs, Madie Reno, MD   50 mg at 12/31/16 2104  . venlafaxine XR (EFFEXOR-XR) 24 hr capsule 150 mg  150 mg Oral Q breakfast Belisa Eichholz B, MD   150 mg at 01/01/17 0810  . [START ON 01/03/2017] Vitamin D (Ergocalciferol) (DRISDOL) capsule 50,000 Units  50,000 Units Oral Weekly Clapacs, John T, MD        Lab Results:  No results found for this or any previous visit (from the past 48 hour(s)).  Blood Alcohol level:  No results found for: [facility]  Metabolic Disorder Labs: Lab Results  Component Value Date   HGBA1C 5.7 (H) 12/29/2016   MPG 116.89 12/29/2016   MPG 123 (H) 06/27/2015   No results found for: PROLACTIN Lab Results  Component Value Date   CHOL 283 (H) 12/29/2016   TRIG 170 (H) 12/29/2016   HDL 47 12/29/2016   CHOLHDL 6.0 12/29/2016   VLDL 34 12/29/2016   LDLCALC 202 (H) 12/29/2016    Physical Findings: AIMS:  , ,  ,  ,    CIWA:  CIWA-Ar Total: 0 COWS:     Musculoskeletal: Strength & Muscle Tone:  within normal limits Gait & Station: normal Patient leans: N/A  Psychiatric Specialty Exam: Physical Exam  Nursing note and vitals reviewed. Psychiatric: Her speech is normal. Her mood appears anxious. She is withdrawn. Cognition and memory are normal. She expresses impulsivity. She exhibits a depressed mood. She expresses suicidal ideation. She expresses suicidal plans.    Review of Systems  Neurological: Positive for headaches.  Psychiatric/Behavioral: Positive for depression, substance abuse and suicidal ideas. The patient has insomnia.   All other systems reviewed and are negative.   Blood pressure 126/90, pulse 93, temperature 98 F (36.7 C), temperature source Oral, resp. rate 18, height _0  (1.676 m), weight 76.2 kg (168 lb), SpO2 100 %.Body mass index is 27.12 kg/m.  General Appearance: Casual  Eye Contact:  Good  Speech:  Clear and Coherent  Volume:  Normal  Mood:  Anxious and Depressed  Affect:  Blunt  Thought Process:  Goal Directed and Descriptions of Associations: Intact  Orientation:  Full (Time, Place, and Person)  Thought Content:  WDL  Suicidal Thoughts:  Yes.  with intent/plan  Homicidal Thoughts:  No  Memory:  Immediate;   Fair Recent;   Fair Remote;   Fair  Judgement:  Poor  Insight:  Lacking  Psychomotor Activity:  Psychomotor Retardation  Concentration:  Concentration: Fair and Attention Span: Fair  Recall:  [employer] of Knowledge:  Fair  Language:  Fair  Akathisia:  No  Handed:  Right  AIMS (if indicated):     Assets:  Communication Skills Desire for Improvement Financial Resources/Insurance Physical Health Resilience  ADL's:  Intact  Cognition:  WNL  Sleep:  Number of Hours: 7.15     Treatment Plan Summary: Daily contact with patient to assess and evaluate symptoms and progress in treatment and Medication management   [name] is a 51 year old female with a history of depression and alcoholism admitted for  suicidal ideation with a  plan to overdose in the context of treatment noncompliance and relapse on alcohol.  1. Suicidal ideation. The patient is able to contract for safety in the hospital.  2. Mood. We restarted Effexor and BuSpar for depression. Thorazine is available for agitation.  3. Alcohol abuse. She is still on Librium taper.   4. Substance abuse treatment. The patient is not interested in residential treatment but is interested in pharmacotherapy for alcoholism.  5. Insomnia. Trazodone is available.  6. Headaches. She reportedly takes Elavil and Zanaflex for headache prevention. We will restart them. She also complains of "face pain" from a collision with her medium-size dog and is asking for narcotics. She will not receive any controlled substances beyong alcohol detox.  7. Hypothyroidism. She is on Synthroid.  8. COPD. She is on Albuterol.  9. HTN. She is on Atenolol.  10. Vit D defficiency. She is on supplement.  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   12. Disposition. She will be discharged with her mother. She requests family meeting.   Orson Slick, MD 01/01/2017, 9:03 AM

## 2017-01-02 MED ORDER — QUETIAPINE FUMARATE 200 MG PO TABS
200.0000 mg | ORAL_TABLET | Freq: Every day | ORAL | Status: DC
  Administered 2017-01-02 – 2017-01-06 (×5): 200 mg via ORAL
  Filled 2017-01-02 (×5): qty 1

## 2017-01-02 MED ORDER — QUETIAPINE FUMARATE 25 MG PO TABS
25.0000 mg | ORAL_TABLET | Freq: Three times a day (TID) | ORAL | Status: DC
  Administered 2017-01-02 – 2017-01-07 (×16): 25 mg via ORAL
  Filled 2017-01-02 (×15): qty 1

## 2017-01-02 NOTE — Progress Notes (Signed)
Continues to complain of anxiety and pain. Requests prns, given with partial relief. Passive SI, Denies HI, AVH. Attends group. Appropriate with staff and peers. Pt reports husband wants divorce. Encouragement and support offered. Safety checks maintained. Pt receptive and remains safe on unit with q 15 min checks.

## 2017-01-02 NOTE — Progress Notes (Signed)
D: Pt denies SI/HI, and contracts for safety. Pt is pleasant and cooperative, affect is flat and sad, but brightens upon approach  Pt stated thoughts are organized , she appears less anxious and she is interacting with peers and staff appropriately.  A: Pt was offered support and encouragement. Pt was given scheduled medications. Pt was encouraged to attend groups. Q 15 minute checks were done for safety.  R:Pt attends groups and interacts well with peers and staff. Pt is taking medication. Patient is receptive to treatment and safety maintained on unit.

## 2017-01-02 NOTE — Progress Notes (Signed)
[facility] MD Progress Note  01/02/2017 8:21 AM Jenna Tyler  MRN:  [id number]  Subjective:   Jenna Tyler is a 51 year old alcoholic and opioid user whose family kicked her out of the house in despair but not wanting to stop using.   12/30/2016. Jenna Tyler feels agitated, sweaty, depressed and anxious today. She is no longer suicidal. She tolerates alcohol detox well. Vital signs are stable. She was restarted on her medications of Effexor and Buspar without side effects. She is not interested in residential substance abuse treatment and is unable to afford it. She still owes money to the [employer]. Somehow, she is uncertain if she may return home with her husband. If this is the case, she will stay with her mother in the area. She does not complain of pain today but yesterday when refused narcotics, she asked for Tramadol. The patient is asking about test results for pancreatitis. I see no such results. She does not have any symptoms suggestive of pancreatitis.   12/31/2016. Jenna Tyler met with treatment team today. She is extremely anxious as her husband would not answer her calls and she is afraid, she is not welcome back. They have been married for 28 years. She threatens suicide if discharged. She now discloses that in addition to alcohol, she obtained a prescription of painkillers from her PCP which angered her family. There is a history of opioid abuse and treatment at the [facility] several years ago. She is interested in outpatient treatment but lives in [place] and has no transportation. She started going to [employer] and is ready to take Antabuse at discharge.  01/01/2017. Jenna Tyler spoke with her daughter yesterday who informed her that she may not return home due to drinking and prescription pill abuse. The patient got agitated and received prn medication. She tells me today that she went to her room later last night and wrote suicide letter to her mother and  wanted to hang herself in the bathroom here. She then talked to her 65-year-old grandson and thought better of it. She denies feeling suicidal now, is able to contract for safety in the hospital but not if discharged. She wants a family meeting before discharge. She intends to return home against her family wishes. Unfortunately, I do not believe that she is committed to sobriety at this point. She still refuses residential treatment.  01/02/2017. Jenna Tyler met with her husband last night who wants a divorce. She is not allowed to return home and will be discharged with her mother. She however is suicidal and unable to contract for safety in the community. She is not committed to sobriety and has not been able to focus on her problems. Today she wants to "fix" her daughter. She has no insight into her problems. She is not interested in residential treatment. She received Seroquel last night due to difficulties sleeping.   Per nursing: D: Pt denies SI/HI, and contracts for safety. Pt is pleasant and cooperative, affect is flat and sad, but brightens upon approach  Pt stated thoughts are organized , she appears less anxious and she is interacting with peers and staff appropriately.  A: Pt was offered support and encouragement. Pt was given scheduled medications. Pt was encouraged to attend groups. Q 15 minute checks were done for safety.  R:Pt attends groups and interacts well with peers and staff. Pt is taking medication. Patient is receptive to treatment and safety maintained on unit.  Principal Problem: Major depressive disorder,  recurrent severe without psychotic features [facility]) Diagnosis:   Patient Active Problem List   Diagnosis Date Noted  . HTN (hypertension) [I10] 12/29/2016  . COPD exacerbation ([place]) [J44.1] 12/29/2016  . Substance induced mood disorder ([place]) [F19.94] 12/29/2016  . Alcohol use disorder, severe, dependence ([place]) [F10.20] 12/27/2016  . Major depressive disorder, recurrent  severe without psychotic features ([place]) [F33.2] 12/27/2016  . Prediabetes [R73.03] 07/07/2015  . Primary hypothyroidism [E03.9] 04/04/2015  . Pre-diabetes [R73.03] 04/04/2015  . Vitamin D deficiency [E55.9] 04/04/2015  . Anxiety [F41.9] 10/12/2014  . Anxiety, generalized [F41.1] 10/12/2014  . H/O: hypothyroidism [Z86.39] 10/12/2014  . Insomnia, persistent [G47.00] 10/12/2014  . Cannot sleep [G47.00] 10/12/2014  . Depression, major, recurrent, moderate ([place]) [F33.1] 10/12/2014  . Drug abuse, opioid type [F11.10] 10/12/2014  . 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 Abdominal lipoma [D17.1] 09/15/2013   Total Time spent with patient: 30 minutes  Past Psychiatric History: depression, alcoholism.  Past Medical History:  Past Medical History:  Diagnosis Date  . Asthma   . Hyperlipidemia   . Hypertension   . Hypothyroidism   . 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 Pancreatitis     Past Surgical History:  Procedure Laterality Date  . ABDOMINAL HYSTERECTOMY    . BREAST BIOPSY Right 2012   negative  . CHOLECYSTECTOMY    . 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 OTHER SURGICAL HISTORY     excision of lipoma  . OTHER SURGICAL HISTORY     Abdominal surgery  . THYROID SURGERY     thyroidectomy   Family History:  Family History  Problem Relation Age of Onset  . Breast cancer Sister 69  . Breast cancer Maternal Aunt 9  . Breast cancer Maternal Grandmother 60  . Breast cancer Maternal Aunt 40   Family Psychiatric  History: alcoholism. Social History:  History  Alcohol Use  . 0.0 oz/week    Comment: 2 glasses whiskey per day     History  Drug Use No    Social History   Social History  . Marital status: Married    Spouse name: N/A  . Number of children: N/A  . Years of education: N/A   Social History Main Topics  . Smoking status: Former Smoker    Quit date: 12/28/1997  . Smokeless tobacco: Never Used  . Alcohol use 0.0 oz/week      Comment: 2 glasses whiskey per day  . Drug use: No  . Sexual activity: Yes    Birth control/ protection: Surgical   Other Topics Concern  . None   Social History Narrative  . None   Additional Social History:                         Sleep: Fair  Appetite:  Fair  Current Medications: Current Facility-Administered Medications  Medication Dose Route Frequency Provider Last Rate Last Dose  . acetaminophen (TYLENOL) tablet 650 mg  650 mg Oral Q6H PRN Clapacs, Madie Reno, MD   650 mg at 01/01/17 1127  . albuterol (PROVENTIL HFA;VENTOLIN HFA) 108 (90 Base) MCG/ACT inhaler 1-2 puff  1-2 puff Inhalation Daily PRN Clapacs, Madie Reno, MD   2 puff at 01/01/17 1643  . alum & mag hydroxide-simeth (MAALOX/MYLANTA) 200-200-20 MG/5ML suspension 30 mL  30 mL Oral Q4H PRN Clapacs, John T, MD      . amitriptyline (ELAVIL) tablet 20 mg  20 mg Oral QHS Clapacs, Madie Reno, MD  20 mg at 01/01/17 2148  . atenolol (TENORMIN) tablet 25 mg  25 mg Oral Daily Clapacs, Madie Reno, MD   25 mg at 01/02/17 0759  . busPIRone (BUSPAR) tablet 5 mg  5 mg Oral BID Diasha Castleman B, MD   5 mg at 01/02/17 0754  . chlorproMAZINE (THORAZINE) tablet 50 mg  50 mg Oral QID PRN Yishai Rehfeld B, MD   50 mg at 01/02/17 0816  . levothyroxine (SYNTHROID, LEVOTHROID) tablet 112 mcg  112 mcg Oral QAC breakfast Clapacs, Madie Reno, MD   112 mcg at 01/02/17 0630  . magnesium hydroxide (MILK OF MAGNESIA) suspension 30 mL  30 mL Oral Daily PRN Clapacs, John T, MD      . pantoprazole (PROTONIX) EC tablet 40 mg  40 mg Oral Daily Roderic Lammert B, MD   40 mg at 01/02/17 0754  . prazosin (MINIPRESS) capsule 2 mg  2 mg Oral Q breakfast Kroy Sprung B, MD   2 mg at 01/02/17 0755  . pseudoephedrine (SUDAFED) 12 hr tablet 120 mg  120 mg Oral BID Ozelle Brubacher B, MD   120 mg at 01/02/17 0800  . thiamine (VITAMIN B-1) tablet 100 mg  100 mg Oral Daily Clapacs, John T, MD   100 mg at 01/02/17 0754   Or  . thiamine (B-1)  injection 100 mg  100 mg Intravenous Daily Clapacs, John T, MD      . tiZANidine (ZANAFLEX) tablet 2 mg  2 mg Oral TID Babette Stum B, MD   2 mg at 01/02/17 0801  . traZODone (DESYREL) tablet 50 mg  50 mg Oral QHS Clapacs, Madie Reno, MD   50 mg at 01/01/17 2148  . venlafaxine XR (EFFEXOR-XR) 24 hr capsule 150 mg  150 mg Oral Q breakfast Nakya Weyand B, MD   150 mg at 01/02/17 0800  . [START ON 01/03/2017] Vitamin D (Ergocalciferol) (DRISDOL) capsule 50,000 Units  50,000 Units Oral Weekly Clapacs, Madie Reno, MD        Lab Results:  Results for orders placed or performed during the hospital encounter of 12/28/16 (from the past 48 hour(s))  Lipase, blood     Status: None   Collection Time: 01/01/17 11:55 AM  Result Value Ref Range   Lipase 23 11 - 51 U/L    Blood Alcohol level:  No results found for: [facility]  Metabolic Disorder Labs: Lab Results  Component Value Date   HGBA1C 5.7 (H) 12/29/2016   MPG 116.89 12/29/2016   MPG 123 (H) 06/27/2015   No results found for: PROLACTIN Lab Results  Component Value Date   CHOL 283 (H) 12/29/2016   TRIG 170 (H) 12/29/2016   HDL 47 12/29/2016   CHOLHDL 6.0 12/29/2016   VLDL 34 12/29/2016   LDLCALC 202 (H) 12/29/2016    Physical Findings: AIMS:  , ,  ,  ,    CIWA:  CIWA-Ar Total: 0 COWS:     Musculoskeletal: Strength & Muscle Tone: within normal limits Gait & Station: normal Patient leans: N/A  Psychiatric Specialty Exam: Physical Exam  Nursing note and vitals reviewed. Psychiatric: Her speech is normal. Her mood appears anxious. She is withdrawn. Cognition and memory are normal. She expresses impulsivity. She exhibits a depressed mood. She expresses suicidal ideation. She expresses suicidal plans.    Review of Systems  Neurological: Positive for headaches.  Psychiatric/Behavioral: Positive for depression, substance abuse and suicidal ideas. The patient has insomnia.   All other systems reviewed and are negative.  Blood  pressure 129/85, pulse (!) 101, temperature 97.8 F (36.6 C), temperature source Oral, resp. rate 18, height _0  (1.676 m), weight 76.2 kg (168 lb), SpO2 98 %.Body mass index is 27.12 kg/m.  General Appearance: Casual  Eye Contact:  Good  Speech:  Clear and Coherent  Volume:  Normal  Mood:  Anxious and Depressed  Affect:  Blunt  Thought Process:  Goal Directed and Descriptions of Associations: Intact  Orientation:  Full (Time, Place, and Person)  Thought Content:  WDL  Suicidal Thoughts:  Yes.  with intent/plan  Homicidal Thoughts:  No  Memory:  Immediate;   Fair Recent;   Fair Remote;   Fair  Judgement:  Poor  Insight:  Lacking  Psychomotor Activity:  Psychomotor Retardation  Concentration:  Concentration: Fair and Attention Span: Fair  Recall:  [employer] of Knowledge:  Fair  Language:  Fair  Akathisia:  No  Handed:  Right  AIMS (if indicated):     Assets:  Communication Skills Desire for Improvement Financial Resources/Insurance Physical Health Resilience  ADL's:  Intact  Cognition:  WNL  Sleep:  Number of Hours: 6     Treatment Plan Summary: Daily contact with patient to assess and evaluate symptoms and progress in treatment and Medication management   Ms. Bachtel is a 51 year old female with a history of depression and alcoholism admitted for suicidal ideation with a plan to overdose in the context of treatment noncompliance and relapse on alcohol.  1. Suicidal ideation. The patient is able to contract for safety in the hospital.  2. Mood. We restarted Effexor and BuSpar for depression. I will add seroquel at nigh and through the day to stabilize mood.   3. Alcohol abuse. She completed Librium taper.   4. Substance abuse treatment. The patient is not interested in residential treatment but is interested in pharmacotherapy for alcoholism.  5. Insomnia. Slept better with Seroquel.   6. Headaches. No headaches reported.   7. Hypothyroidism. She is on  Synthroid.  8. COPD. She is on Albuterol.  9. HTN. She is on Atenolol.  10. Vit D defficiency. She is on supplement.  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 Lipaze is normal.  12. Disposition. She will be discharged with her mother. The family refused meeting.   Orson Slick, MD 01/02/2017, 8:21 AM

## 2017-01-02 NOTE — Plan of Care (Signed)
Problem: Coping: Goal: Ability to verbalize frustrations and anger appropriately will improve Outcome: Progressing Patient verbalized her anger over her husband telling her today he wants a divorce.

## 2017-01-02 NOTE — [facility] Group Notes (Signed)
[facility] Group Notes:  (Nursing/MHT/Case Management/Adjunct)  Date:  01/02/2017  Time:  2:28 PM  Type of Therapy:  Psychoeducational Skills  Participation Level:  Minimal  Participation Quality:  Appropriate and Attentive  Affect:  Flat  Cognitive:  Appropriate  Insight:  Appropriate  Engagement in Group:  Limited  Modes of Intervention:  Discussion and Education  Summary of Progress/Problems:  Yasmeen Manka A Jenan Ellegood 01/02/2017, 2:28 PM

## 2017-01-02 NOTE — Plan of Care (Signed)
Problem: Coping: Goal: Ability to verbalize frustrations and anger appropriately will improve Patient able to discuss feelings about husband and passive SI thoughts to Probation officer

## 2017-01-02 NOTE — [facility] Group Notes (Signed)
  [facility] LCSW Group Therapy Note  Date/Time: 01/02/17, 0930  Type of Therapy/Topic:  Group Therapy:  Emotion Regulation  Participation Level:  Active   Mood: pleasant  Description of Group:    The purpose of this group is to assist patients in learning to regulate negative emotions and experience positive emotions. Patients will be guided to discuss ways in which they have been vulnerable to their negative emotions. These vulnerabilities will be juxtaposed with experiences of positive emotions or situations, and patients challenged to use positive emotions to combat negative ones. Special emphasis will be placed on coping with negative emotions in conflict situations, and patients will process healthy conflict resolution skills.  Therapeutic Goals: 1. Patient will identify two positive emotions or experiences to reflect on in order to balance out negative emotions:  2. Patient will label two or more emotions that they find the most difficult to experience:  3. Patient will be able to demonstrate positive conflict resolution skills through discussion or role plays:   Summary of Patient Progress: Pt described anxiety, depression, and sadness as difficult emotions for her to experience.  PT was active in group discussion regarding more positive ways to handle difficult emotions.       Therapeutic Modalities:   Cognitive Behavioral Therapy Feelings Identification Dialectical Behavioral Therapy  Lurline Idol, LCSW

## 2017-01-03 MED ORDER — POLYETHYLENE GLYCOL 3350 17 G PO PACK
17.0000 g | PACK | Freq: Every day | ORAL | Status: DC
  Administered 2017-01-03 – 2017-01-06 (×2): 17 g via ORAL
  Filled 2017-01-03 (×4): qty 1

## 2017-01-03 MED ORDER — DOCUSATE SODIUM 100 MG PO CAPS
200.0000 mg | ORAL_CAPSULE | Freq: Every day | ORAL | Status: DC
  Administered 2017-01-03 – 2017-01-06 (×3): 200 mg via ORAL
  Filled 2017-01-03 (×5): qty 2

## 2017-01-03 NOTE — Progress Notes (Signed)
Patient ID: Jenna Tyler, female   DOB: 10/28/1965, 51 y.o.   MRN: [id number] Sociable, interacting well with peers, mood and affect appropriate until after bedtime medication, "Serquel 200 mg is not enough to put me to sleep." Upset when she was informed that Thorazine, Trazodone, Tizanidine and Elavil were discontinued; "I am no longer suicidal, I am attending the groups, I plan to attend [facility] again, I don't know why my medications were discontinued.Marland Kitchen"

## 2017-01-03 NOTE — [facility] Group Notes (Signed)
[facility] Group Notes:  (Nursing/MHT/Case Management/Adjunct)  Date:  01/03/2017  Time:  4:20 PM  Type of Therapy:  Psychoeducational Skills  Participation Level:  Active  Participation Quality:  Appropriate, Attentive, Sharing and Supportive  Affect:  Appropriate  Cognitive:  Appropriate  Insight:  Appropriate  Engagement in Group:  Engaged and Supportive  Modes of Intervention:  Discussion and Education  Summary of Progress/Problems:  Jenna Tyler 01/03/2017, 4:20 PM

## 2017-01-03 NOTE — Progress Notes (Signed)
Patient is pleasant and cooperative in the unit.Appropriate with staff & peers.Denies suicidal or homicidal ideations and AV hallucinations.Attended groups.Compliant with medications.Appetite and energy level good.Support and encouragement given.

## 2017-01-03 NOTE — Progress Notes (Signed)
Patient ID: Jenna Tyler, female   DOB: 1965/05/04, 51 y.o.   MRN: [id number] LCSW Group Therapy Note 01/03/2017 9am  Type of Therapy and Topic:  Group Therapy:  Setting Goals  Participation Level:  Active  Description of Group: In this process group, patients discussed using strengths to work toward goals and address challenges.  Patients identified two positive things about themselves and one goal they were working on.  Patients were given the opportunity to share openly and support each other's plan for self-empowerment.  The group discussed the value of gratitude and were encouraged to have a daily reflection of positive characteristics or circumstances.  Patients were encouraged to identify a plan to utilize their strengths to work on current challenges and goals.  Therapeutic Goals 1. Patient will verbalize personal strengths/positive qualities and relate how these can assist with achieving desired personal goals 2. Patients will verbalize affirmation of peers plans for personal change and goal setting 3. Patients will explore the value of gratitude and positive focus as related to successful achievement of goals 4. Patients will verbalize a plan for regular reinforcement of personal positive qualities and circumstances.  Summary of Patient Progress:  Pt verbalizes that her goal is to be happier/better mood today. She verbalizes that her  Kyra Searles will help her meet her goals.    Therapeutic Modalities Cognitive Behavioral Therapy Motivational Interviewing    August Saucer, LCSW 01/03/2017 11:13 AM

## 2017-01-03 NOTE — Plan of Care (Signed)
Problem: Activity: Goal: Sleeping patterns will improve Outcome: Progressing Patient slept for Estimated Hours of 6.15; Precautionary checks every 15 minutes for safety maintained, room free of safety hazards, patient sustains no injury or falls during this shift.    

## 2017-01-03 NOTE — Progress Notes (Addendum)
[facility] MD Progress Note  01/03/2017 4:31 PM Nakari Bracknell  MRN:  [id number]  Subjective:   Ms. Kawa is a 51 year old alcoholic and opioid user whose family kicked her out of the house in despair but not wanting to stop using.   01/01/2017. Ms. Sangiovanni spoke with her daughter yesterday who informed her that she may not return home due to drinking and prescription pill abuse. The patient got agitated and received prn medication. She tells me today that she went to her room later last night and wrote suicide letter to her mother and wanted to hang herself in the bathroom here. She then talked to her 51-year-old grandson and thought better of it. She denies feeling suicidal now, is able to contract for safety in the hospital but not if discharged. She wants a family meeting before discharge. She intends to return home against her family wishes. Unfortunately, I do not believe that she is committed to sobriety at this point. She still refuses residential treatment.  01/02/2017. Ms. Westergren met with her husband last night who wants a divorce. She is not allowed to return home and will be discharged with her mother. She however is suicidal and unable to contract for safety in the community. She is not committed to sobriety and has not been able to focus on her problems. Today she wants to "fix" her daughter. She has no insight into her problems. She is not interested in residential treatment. She received Seroquel last night due to difficulties sleeping.   01/03/2017. Ms. Bostick seems to accept that her family is "done" with her. She changed her mind about substance abuse treatment and wants long term rehab. SW and the patient contacted [facility] in [place] where the patient received treatment before. She hopes that they will "sponsor" her as she has no money and private insurance. In any case, she is still suicidal and unable to contract for safety in the community. Unfortunately, she is med  seaking.   Per nursing: D: Pt denies SI/HI, and contracts for safety. Pt is pleasant and cooperative, affect is flat and sad, but brightens upon approach  Pt stated thoughts are organized , she appears less anxious and she is interacting with peers and staff appropriately.  A: Pt was offered support and encouragement. Pt was given scheduled medications. Pt was encouraged to attend groups. Q 15 minute checks were done for safety.  R:Pt attends groups and interacts well with peers and staff. Pt is taking medication. Patient is receptive to treatment and safety maintained on unit.  Principal Problem: Major depressive disorder, recurrent severe without psychotic features ([place]) Diagnosis:   Patient Active Problem List   Diagnosis Date Noted  . HTN (hypertension) [I10] 12/29/2016  . COPD exacerbation ([place]) [J44.1] 12/29/2016  . Substance induced mood disorder ([place]) [F19.94] 12/29/2016  . Alcohol use disorder, severe, dependence ([place]) [F10.20] 12/27/2016  . Major depressive disorder, recurrent severe without psychotic features ([place]) [F33.2] 12/27/2016  . Prediabetes [R73.03] 07/07/2015  . Primary hypothyroidism [E03.9] 04/04/2015  . Pre-diabetes [R73.03] 04/04/2015  . Vitamin D deficiency [E55.9] 04/04/2015  . Anxiety [F41.9] 10/12/2014  . Anxiety, generalized [F41.1] 10/12/2014  . H/O: hypothyroidism [Z86.39] 10/12/2014  . Insomnia, persistent [G47.00] 10/12/2014  . Cannot sleep [G47.00] 10/12/2014  . Depression, major, recurrent, moderate ([place]) [F33.1] 10/12/2014  . Drug abuse, opioid type [F11.10] 10/12/2014  . 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 Abdominal lipoma [D17.1] 09/15/2013   Total Time spent with patient: 30 minutes  Past Psychiatric History: depression, alcoholism.  Past Medical History:  Past Medical History:  Diagnosis Date  . Asthma   . Hyperlipidemia   . Hypertension   . Hypothyroidism   . 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  Pancreatitis     Past Surgical History:  Procedure Laterality Date  . ABDOMINAL HYSTERECTOMY    . BREAST BIOPSY Right 2012   negative  . CHOLECYSTECTOMY    . 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 OTHER SURGICAL HISTORY     excision of lipoma  . OTHER SURGICAL HISTORY     Abdominal surgery  . THYROID SURGERY     thyroidectomy   Family History:  Family History  Problem Relation Age of Onset  . Breast cancer Sister 72  . Breast cancer Maternal Aunt 49  . Breast cancer Maternal Grandmother 60  . Breast cancer Maternal Aunt 37   Family Psychiatric  History: alcoholism. Social History:  History  Alcohol Use  . 0.0 oz/week    Comment: 2 glasses whiskey per day     History  Drug Use No    Social History   Social History  . Marital status: Married    Spouse name: N/A  . Number of children: N/A  . Years of education: N/A   Social History Main Topics  . Smoking status: Former Smoker    Quit date: 12/28/1997  . Smokeless tobacco: Never Used  . Alcohol use 0.0 oz/week     Comment: 2 glasses whiskey per day  . Drug use: No  . Sexual activity: Yes    Birth control/ protection: Surgical   Other Topics Concern  . None   Social History Narrative  . None   Additional Social History:                         Sleep: Fair  Appetite:  Fair  Current Medications: Current Facility-Administered Medications  Medication Dose Route Frequency Provider Last Rate Last Dose  . acetaminophen (TYLENOL) tablet 650 mg  650 mg Oral Q6H PRN Clapacs, Madie Reno, MD   650 mg at 01/03/17 1441  . albuterol (PROVENTIL HFA;VENTOLIN HFA) 108 (90 Base) MCG/ACT inhaler 1-2 puff  1-2 puff Inhalation Daily PRN Clapacs, Madie Reno, MD   2 puff at 01/01/17 1643  . alum & mag hydroxide-simeth (MAALOX/MYLANTA) 200-200-20 MG/5ML suspension 30 mL  30 mL Oral Q4H PRN Clapacs, John T, MD      . atenolol (TENORMIN) tablet 25 mg  25 mg Oral Daily Clapacs, Madie Reno, MD   25 mg at 01/03/17 0820  . busPIRone  (BUSPAR) tablet 5 mg  5 mg Oral BID Sydnee Lamour B, MD   5 mg at 01/03/17 0817  . docusate sodium (COLACE) capsule 200 mg  200 mg Oral Daily Na Waldrip B, MD   200 mg at 01/03/17 1351  . levothyroxine (SYNTHROID, LEVOTHROID) tablet 112 mcg  112 mcg Oral QAC breakfast Clapacs, Madie Reno, MD   112 mcg at 01/03/17 0630  . magnesium hydroxide (MILK OF MAGNESIA) suspension 30 mL  30 mL Oral Daily PRN Clapacs, John T, MD      . pantoprazole (PROTONIX) EC tablet 40 mg  40 mg Oral Daily Shara Hartis B, MD   40 mg at 01/03/17 0817  . polyethylene glycol (MIRALAX / GLYCOLAX) packet 17 g  17 g Oral Daily Maizy Davanzo B, MD   17 g at 01/03/17 1350  . prazosin (MINIPRESS) capsule  2 mg  2 mg Oral Q breakfast Dewane Timson B, MD   2 mg at 01/03/17 0820  . pseudoephedrine (SUDAFED) 12 hr tablet 120 mg  120 mg Oral BID Lutisha Knoche B, MD   120 mg at 01/02/17 1630  . QUEtiapine (SEROQUEL) tablet 200 mg  200 mg Oral QHS Avik Leoni B, MD   200 mg at 01/02/17 2141  . QUEtiapine (SEROQUEL) tablet 25 mg  25 mg Oral TID Vanassa Penniman B, MD   25 mg at 01/03/17 1204  . thiamine (VITAMIN B-1) tablet 100 mg  100 mg Oral Daily Clapacs, John T, MD   100 mg at 01/03/17 0086   Or  . thiamine (B-1) injection 100 mg  100 mg Intravenous Daily Clapacs, John T, MD      . venlafaxine XR (EFFEXOR-XR) 24 hr capsule 150 mg  150 mg Oral Q breakfast Yossef Gilkison B, MD   150 mg at 01/03/17 0817  . Vitamin D (Ergocalciferol) (DRISDOL) capsule 50,000 Units  50,000 Units Oral Weekly Clapacs, Madie Reno, MD   50,000 Units at 01/03/17 7619    Lab Results:  No results found for this or any previous visit (from the past 48 hour(s)).  Blood Alcohol level:  No results found for: [facility]  Metabolic Disorder Labs: Lab Results  Component Value Date   HGBA1C 5.7 (H) 12/29/2016   MPG 116.89 12/29/2016   MPG 123 (H) 06/27/2015   No results found for: PROLACTIN Lab Results  Component Value  Date   CHOL 283 (H) 12/29/2016   TRIG 170 (H) 12/29/2016   HDL 47 12/29/2016   CHOLHDL 6.0 12/29/2016   VLDL 34 12/29/2016   LDLCALC 202 (H) 12/29/2016    Physical Findings: AIMS:  , ,  ,  ,    CIWA:  CIWA-Ar Total: 0 COWS:     Musculoskeletal: Strength & Muscle Tone: within normal limits Gait & Station: normal Patient leans: N/A  Psychiatric Specialty Exam: Physical Exam  Nursing note and vitals reviewed. Psychiatric: Her speech is normal. Her mood appears anxious. She is withdrawn. Cognition and memory are normal. She expresses impulsivity. She exhibits a depressed mood. She expresses suicidal ideation. She expresses suicidal plans.    Review of Systems  Neurological: Positive for headaches.  Psychiatric/Behavioral: Positive for depression, substance abuse and suicidal ideas. The patient has insomnia.   All other systems reviewed and are negative.   Blood pressure 125/77, pulse 92, temperature 97.9 F (36.6 C), resp. rate 18, height '5\' 6"'$  (1.676 m), weight 76.2 kg (168 lb), SpO2 98 %.Body mass index is 27.12 kg/m.  General Appearance: Casual  Eye Contact:  Good  Speech:  Clear and Coherent  Volume:  Normal  Mood:  Anxious and Depressed  Affect:  Blunt  Thought Process:  Goal Directed and Descriptions of Associations: Intact  Orientation:  Full (Time, Place, and Person)  Thought Content:  WDL  Suicidal Thoughts:  Yes.  with intent/plan  Homicidal Thoughts:  No  Memory:  Immediate;   Fair Recent;   Fair Remote;   Fair  Judgement:  Poor  Insight:  Lacking  Psychomotor Activity:  Psychomotor Retardation  Concentration:  Concentration: Fair and Attention Span: Fair  Recall:  [employer] of Knowledge:  Fair  Language:  Fair  Akathisia:  No  Handed:  Right  AIMS (if indicated):     Assets:  Communication Skills Desire for Improvement Financial Resources/Insurance Physical Health Resilience  ADL's:  Intact  Cognition:  WNL  Sleep:  Number of Hours: 6.15      Treatment Plan Summary: Daily contact with patient to assess and evaluate symptoms and progress in treatment and Medication management   Ms. Pleitez is a 51 year old female with a history of depression and alcoholism admitted for suicidal ideation with a plan to overdose in the context of treatment noncompliance and relapse on alcohol.  1. Suicidal ideation. The patient is able to contract for safety in the hospital.  2. Mood. We restarted Effexor and BuSpar for depression. I will add seroquel at nigh and through the day to stabilize mood.   3. Alcohol abuse. She completed Librium taper.   4. Substance abuse treatment. The patient is now interested in residential treatment and pharmacotherapy for alcoholism.  5. Insomnia. Slept better with Seroquel.   6. Headaches. No headaches reported.   7. Hypothyroidism. She is on Synthroid.  8. COPD. She is on Albuterol.  9. HTN. She is on Atenolol.  10. Vit D defficiency. She is on supplement.  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 Lipase is normal.  12. Constipation. Colace and Miralax were started.  13. Disposition. TBE. Referral to [facility] was made. She will be discharged with her mother. The family refused meeting.   Orson Slick, MD 01/03/2017, 4:31 PM

## 2017-01-03 NOTE — Progress Notes (Signed)
Patient ID: Jenna Tyler, female   DOB: 1966/04/16, 51 y.o.   MRN: [id number] LCSW Group Therapy Note  01/03/2017 9:30am  Type of Therapy/Topic:  Group Therapy:  Balance in Life  Participation Level:  Active  Description of Group:    This group will address the concept of balance and how it feels and looks when one is unbalanced. Patients will be encouraged to process areas in their lives that are out of balance and identify reasons for remaining unbalanced. Facilitators will guide patients in utilizing problem-solving interventions to address and correct the stressor making their life unbalanced. Understanding and applying boundaries will be explored and addressed for obtaining and maintaining a balanced life. Patients will be encouraged to explore ways to assertively make their unbalanced needs known to significant others in their lives, using other group members and facilitator for support and feedback.  Therapeutic Goals: 1. Patient will identify two or more emotions or situations they have that consume much of in their lives. 2. Patient will identify signs/triggers that life has become out of balance:  3. Patient will identify two ways to set boundaries in order to achieve balance in their lives:  4. Patient will demonstrate ability to communicate their needs through discussion and/or role plays  Summary of Patient Progress:  Pt able to meet therapeutic goals listed above.  Pt able to identify a few things that tend to "get her off balance" and some ideas for how to maintain her balance at discharge.    Therapeutic Modalities:   Cognitive Behavioral Therapy Solution-Focused Therapy Assertiveness Training  August Saucer, LCSW 01/03/2017 11:16 AM

## 2017-01-03 NOTE — Progress Notes (Signed)
CSW spoke with Merrily Pew, intake at [facility].  Pt contacted him yesterday and is eligible for readmission.  He needs info: face sheet MAR, H and P, labs.  Will evaluate for appropriateness and get back to CSW. Info faxed. Winferd Humphrey, MSW, LCSW Clinical Social Worker 01/03/2017 10:36 AM

## 2017-01-03 NOTE — B[facility] Group Notes (Signed)
[facility] Group Notes:  (Nursing/MHT/Case Management/Adjunct)  Date:  01/03/2017  Time:  9:31 PM  Type of Therapy:  Group Therapy  Participation Level:  Active  Participation Quality:  Appropriate  Affect:  Appropriate  Cognitive:  Alert  Insight:  Good  Engagement in Group:  Engaged  Modes of Intervention:  Activity  Summary of Progress/Problems:  Jenna Tyler 01/03/2017, 9:31 PM

## 2017-01-03 NOTE — Plan of Care (Signed)
Problem: Coping: Goal: Ability to verbalize frustrations and anger appropriately will improve Outcome: Progressing Patient remained calm and appropriate with staff & peers.  Problem: Safety: Goal: Periods of time without injury will increase Outcome: Progressing Safety maintained in unit.  Problem: Activity: Goal: Risk for activity intolerance will decrease Outcome: Progressing Attended group activities.

## 2017-01-04 DIAGNOSIS — F1020 Alcohol dependence, uncomplicated: Secondary | ICD-10-CM

## 2017-01-04 NOTE — BHH Group Notes (Signed)
[facility] LCSW Group Therapy Note  Date/Time: 01/04/17, 0930  Type of Therapy and Topic:  Group Therapy:  Feelings around Relapse and Recovery  Participation Level:  Active   Mood: pleasant  Description of Group:    Patients in this group will discuss emotions they experience before and after a relapse. They will process how experiencing these feelings, or avoidance of experiencing them, relates to having a relapse. Facilitator will guide patients to explore emotions they have related to recovery. Patients will be encouraged to process which emotions are more powerful. They will be guided to discuss the emotional reaction significant others in their lives may have to patients' relapse or recovery. Patients will be assisted in exploring ways to respond to the emotions of others without this contributing to a relapse.  Therapeutic Goals: 1. Patient will identify two or more emotions that lead to relapse for them:  2. Patient will identify two emotions that result when they relapse:  3. Patient will identify two emotions related to recovery:  4. Patient will demonstrate ability to communicate their needs through discussion and/or role plays.   Summary of Patient Progress: Pt was very active in group discussion regarding relapse.  Pt identified feeling lonely as a trigger for relapse for her.  She shared struggles with addiction and triggers and was appropriate throughout.     Therapeutic Modalities:   Cognitive Behavioral Therapy Solution-Focused Therapy Assertiveness Training Relapse Prevention Therapy  Lurline Idol, LCSW

## 2017-01-04 NOTE — Tx Team (Signed)
Interdisciplinary Treatment and Diagnostic Plan Update  01/04/2017 Time of Session: [place] MRN: [id number]  Principal Diagnosis: Major depressive disorder, recurrent severe without psychotic features ([place])  Secondary Diagnoses: Principal Problem:   Major depressive disorder, recurrent severe without psychotic features ([place]) Active Problems:   Primary hypothyroidism   Vitamin D deficiency   Alcohol use disorder, severe, dependence ([place])   HTN (hypertension)   COPD exacerbation ([place])   Substance induced mood disorder ([place])   Current Medications:  Current Facility-Administered Medications  Medication Dose Route Frequency Provider Last Rate Last Dose  . acetaminophen (TYLENOL) tablet 650 mg  650 mg Oral Q6H PRN Clapacs, Madie Reno, MD   650 mg at 01/04/17 0807  . albuterol (PROVENTIL HFA;VENTOLIN HFA) 108 (90 Base) MCG/ACT inhaler 1-2 puff  1-2 puff Inhalation Daily PRN Clapacs, Madie Reno, MD   2 puff at 01/01/17 1643  . alum & mag hydroxide-simeth (MAALOX/MYLANTA) 200-200-20 MG/5ML suspension 30 mL  30 mL Oral Q4H PRN Clapacs, John T, MD      . atenolol (TENORMIN) tablet 25 mg  25 mg Oral Daily Clapacs, Madie Reno, MD   25 mg at 01/04/17 8242  . busPIRone (BUSPAR) tablet 5 mg  5 mg Oral BID Pucilowska, Jolanta B, MD   5 mg at 01/04/17 0807  . docusate sodium (COLACE) capsule 200 mg  200 mg Oral Daily Pucilowska, Jolanta B, MD   200 mg at 01/04/17 0807  . levothyroxine (SYNTHROID, LEVOTHROID) tablet 112 mcg  112 mcg Oral QAC breakfast Clapacs, Madie Reno, MD   112 mcg at 01/04/17 [phone]  . magnesium hydroxide (MILK OF MAGNESIA) suspension 30 mL  30 mL Oral Daily PRN Clapacs, John T, MD      . pantoprazole (PROTONIX) EC tablet 40 mg  40 mg Oral Daily Pucilowska, Jolanta B, MD   40 mg at 01/04/17 0807  . polyethylene glycol (MIRALAX / GLYCOLAX) packet 17 g  17 g Oral Daily Pucilowska, Jolanta B, MD   17 g at 01/03/17 1350  . prazosin (MINIPRESS) capsule 2 mg  2 mg Oral Q breakfast  Pucilowska, Jolanta B, MD   2 mg at 01/04/17 0808  . pseudoephedrine (SUDAFED) 12 hr tablet 120 mg  120 mg Oral BID Pucilowska, Jolanta B, MD   120 mg at 01/04/17 0809  . QUEtiapine (SEROQUEL) tablet 200 mg  200 mg Oral QHS Pucilowska, Jolanta B, MD   200 mg at 01/03/17 2108  . QUEtiapine (SEROQUEL) tablet 25 mg  25 mg Oral TID Pucilowska, Jolanta B, MD   25 mg at 01/04/17 1302  . thiamine (VITAMIN B-1) tablet 100 mg  100 mg Oral Daily Clapacs, John T, MD   100 mg at 01/04/17 1443   Or  . thiamine (B-1) injection 100 mg  100 mg Intravenous Daily Clapacs, John T, MD      . venlafaxine XR (EFFEXOR-XR) 24 hr capsule 150 mg  150 mg Oral Q breakfast Pucilowska, Jolanta B, MD   150 mg at 01/04/17 0807  . Vitamin D (Ergocalciferol) (DRISDOL) capsule 50,000 Units  50,000 Units Oral Weekly Clapacs, Madie Reno, MD   50,000 Units at 01/03/17 1540   PTA Medications: Prescriptions Prior to Admission  Medication Sig Dispense Refill Last Dose  . albuterol (VENTOLIN HFA) 108 (90 Base) MCG/ACT inhaler Inhale into the lungs.   Taking  . amitriptyline (ELAVIL) 10 MG tablet Take 2 tablets by mouth daily.  0 Not Taking at Unknown time  . atenolol (TENORMIN) 25 MG  tablet Take 25 mg by mouth at bedtime.   3 Not Taking at Unknown time  . levothyroxine (SYNTHROID, LEVOTHROID) 112 MCG tablet Take 112 mcg by mouth daily.  0 Not Taking at Unknown time  . oxyCODONE-acetaminophen (PERCOCET/ROXICET) 5-325 MG tablet Take 1 tablet by mouth every 6 (six) hours as needed.  0 Not Taking at Unknown time  . promethazine (PHENERGAN) 12.5 MG tablet Take 1 tablet by mouth every 6 (six) hours as needed.  4 Not Taking at Unknown time  . ranitidine (ZANTAC) 150 MG tablet Take 150 mg by mouth 2 (two) times daily.   Not Taking at Unknown time  . SYMBICORT 160-4.5 MCG/ACT inhaler Inhale 2 puffs into the lungs 2 (two) times daily.   4 Not Taking at Unknown time  . tiZANidine (ZANAFLEX) 4 MG tablet Take 4 mg by mouth 3 (three) times daily.  0 Not  Taking at Unknown time  . venlafaxine (EFFEXOR) 75 MG tablet Take 1 tablet by mouth daily.  0 Not Taking at Unknown time  . Vitamin D, Ergocalciferol, (DRISDOL) 50000 units CAPS capsule Take 1 capsule by mouth once a week.  0 Not Taking at Unknown time    Patient Stressors: Marital or family conflict Substance abuse  Patient Strengths: Average or above average intelligence Supportive family/friends  Treatment Modalities: Medication Management, Group therapy, Case management,  1 to 1 session with clinician, Psychoeducation, Recreational therapy.   Physician Treatment Plan for Primary Diagnosis: Major depressive disorder, recurrent severe without psychotic features ([place]) Long Term Goal(s): Improvement in symptoms so as ready for discharge Improvement in symptoms so as ready for discharge   Short Term Goals: Ability to identify changes in lifestyle to reduce recurrence of condition will improve Ability to verbalize feelings will improve Ability to disclose and discuss suicidal ideas Ability to demonstrate self-control will improve Ability to identify and develop effective coping behaviors will improve Ability to maintain clinical measurements within normal limits will improve Compliance with prescribed medications will improve Ability to identify triggers associated with substance abuse/mental health issues will improve Ability to identify changes in lifestyle to reduce recurrence of condition will improve Ability to demonstrate self-control will improve Ability to identify triggers associated with substance abuse/mental health issues will improve  Medication Management: Evaluate patient's response, side effects, and tolerance of medication regimen.  Therapeutic Interventions: 1 to 1 sessions, Unit Group sessions and Medication administration.  Evaluation of Outcomes: Progressing  Physician Treatment Plan for Secondary Diagnosis: Principal Problem:   Major depressive disorder,  recurrent severe without psychotic features ([place]) Active Problems:   Primary hypothyroidism   Vitamin D deficiency   Alcohol use disorder, severe, dependence ([place])   HTN (hypertension)   COPD exacerbation ([facility])   Substance induced mood disorder ([place])  Long Term Goal(s): Improvement in symptoms so as ready for discharge Improvement in symptoms so as ready for discharge   Short Term Goals: Ability to identify changes in lifestyle to reduce recurrence of condition will improve Ability to verbalize feelings will improve Ability to disclose and discuss suicidal ideas Ability to demonstrate self-control will improve Ability to identify and develop effective coping behaviors will improve Ability to maintain clinical measurements within normal limits will improve Compliance with prescribed medications will improve Ability to identify triggers associated with substance abuse/mental health issues will improve Ability to identify changes in lifestyle to reduce recurrence of condition will improve Ability to demonstrate self-control will improve Ability to identify triggers associated with substance abuse/mental health issues will improve  Medication Management: Evaluate patient's response, side effects, and tolerance of medication regimen.  Therapeutic Interventions: 1 to 1 sessions, Unit Group sessions and Medication administration.  Evaluation of Outcomes: Progressing   RN Treatment Plan for Primary Diagnosis: Major depressive disorder, recurrent severe without psychotic features ([place]) Long Term Goal(s): Knowledge of disease and therapeutic regimen to maintain health will improve  Short Term Goals: Ability to verbalize feelings will improve, Ability to disclose and discuss suicidal ideas, Ability to identify and develop effective coping behaviors will improve and Compliance with prescribed medications will improve  Medication Management: RN will administer medications as ordered by  provider, will assess and evaluate patient's response and provide education to patient for prescribed medication. RN will report any adverse and/or side effects to prescribing provider.  Therapeutic Interventions: 1 on 1 counseling sessions, Psychoeducation, Medication administration, Evaluate responses to treatment, Monitor vital signs and CBGs as ordered, Perform/monitor CIWA, COWS, AIMS and Fall Risk screenings as ordered, Perform wound care treatments as ordered.  Evaluation of Outcomes: Progressing   LCSW Treatment Plan for Primary Diagnosis: Major depressive disorder, recurrent severe without psychotic features ([place]) Long Term Goal(s): Safe transition to appropriate next level of care at discharge, Engage patient in therapeutic group addressing interpersonal concerns.  Short Term Goals: Engage patient in aftercare planning with referrals and resources and Increase social support  Therapeutic Interventions: Assess for all discharge needs, 1 to 1 time with Social worker, Explore available resources and support systems, Assess for adequacy in community support network, Educate family and significant other(s) on suicide prevention, Complete Psychosocial Assessment, Interpersonal group therapy.  Evaluation of Outcomes: Progressing   Progress in Treatment: Attending groups: Yes. Participating in groups: Yes. Taking medication as prescribed: Yes. Toleration medication: Yes. Family/Significant other contact made: Yes, individual(s) contacted:  daughter Patient understands diagnosis: Yes Discussing patient identified problems/goals with staff: Yes. Medical problems stabilized or resolved: Yes. Denies suicidal/homicidal ideation: Yes. Issues/concerns per patient self-inventory: No. Other: none   New problem(s) identified: No, Describe:  none  New Short Term/Long Term Goal(s):Pt goal: "to not want to die."  Discharge Plan or Barriers: CSW assessing for appropriate plan.  Reason for  Continuation of Hospitalization: Depression Medication stabilization  Estimated Length of Stay:2-3 days.  Attendees: Patient: 01/04/2017   Physician: Dr. Bary Leriche, MD 01/04/2017   Nursing: Elige Radon, RN 01/04/2017   RN Care Manager: 01/04/2017   Social Worker: Lurline Idol, LCSW 01/04/2017   Recreational Therapist:  01/04/2017   Other:  01/04/2017   Other:  01/04/2017   Other: 01/04/2017         Scribe for Treatment Team: Joanne Chars, [place] 01/04/2017 2:19 PM

## 2017-01-04 NOTE — Plan of Care (Signed)
Problem: Safety: Goal: Periods of time without injury will increase Outcome: Progressing Pt remains safe while in hospital injury free.    

## 2017-01-04 NOTE — [facility] Group Notes (Signed)
[place] Group Notes:  (Nursing/MHT/Case Management/Adjunct)  Date:  01/04/2017  Time:  2:11 PM  Type of Therapy:  Psychoeducational Skills  Participation Level:  Active  Participation Quality:  Appropriate, Attentive, Sharing and Supportive  Affect:  Appropriate  Cognitive:  Appropriate  Insight:  Appropriate  Engagement in Group:  Engaged and Supportive  Modes of Intervention:  Activity, Discussion, Education and Support  Summary of Progress/Problems:  Jenna Tyler 01/04/2017, 2:11 PM

## 2017-01-04 NOTE — Progress Notes (Signed)
Pt is in a pleasant mood. Denies SI, HI or A/V hallucinations. Alert and oriented x 4. Pt interacts appropriately with peers and staff appropriately. Pt is med compliant. Pt contracted to safety. 15 min safety checks continues.

## 2017-01-04 NOTE — Progress Notes (Signed)
Pt denies SI, HI, a/v hallucinations. Pt commits to safety on unit. fShe is cooperative with staff. No aggressive or hostile behaviors noted. Will continue to monitor.

## 2017-01-04 NOTE — Plan of Care (Signed)
Problem: Safety: Goal: Periods of time without injury will increase Outcome: Progressing Pt will remain injury free the entire shift.   

## 2017-01-04 NOTE — Progress Notes (Signed)
Patient ID: Jenna Tyler, female   DOB: Jul 06, 1965, 51 y.o.   MRN: [id number] Happy mood and affect, "I talked to Dr. Mamie Nick, she is not mad at me anymore, I apologized for walking out on her, that was disrespectful of me. I think I will go long term when I am discharged to [facility]." Assertive, optimistic, hopeful, complied with medication, bright smiles and well kept appearance; denied SI/HI/AVH.

## 2017-01-04 NOTE — Progress Notes (Signed)
[facility] MD Progress Note  01/04/2017 6:53 AM William Laske  MRN:  [id number]  Subjective:   Ms. Fees is a 51 year old alcoholic and opioid user whose family kicked her out of the house in despair but not wanting to stop using.   01/01/2017. Ms. Kantor spoke with her daughter yesterday who informed her that she may not return home due to drinking and prescription pill abuse. The patient got agitated and received prn medication. She tells me today that she went to her room later last night and wrote suicide letter to her mother and wanted to hang herself in the bathroom here. She then talked to her 62-year-old grandson and thought better of it. She denies feeling suicidal now, is able to contract for safety in the hospital but not if discharged. She wants a family meeting before discharge. She intends to return home against her family wishes. Unfortunately, I do not believe that she is committed to sobriety at this point. She still refuses residential treatment.  01/02/2017. Ms. Guest met with her husband last night who wants a divorce. She is not allowed to return home and will be discharged with her mother. She however is suicidal and unable to contract for safety in the community. She is not committed to sobriety and has not been able to focus on her problems. Today she wants to "fix" her daughter. She has no insight into her problems. She is not interested in residential treatment. She received Seroquel last night due to difficulties sleeping.   01/03/2017. Ms. Yamaguchi seems to accept that her family is "done" with her. She changed her mind about substance abuse treatment and wants long term rehab. SW and the patient contacted [facility] in [place] where the patient received treatment before. She hopes that they will "sponsor" her as she has no money and private insurance. In any case, she is still suicidal and unable to contract for safety in the community. Unfortunately, she is med  seaking.   01/04/17: Patient reports that she is sleeping much better now. She feels like her mood is slowly improving and she is no longer having suicidal thoughts. She is committed to going to residential substance abuse treatment and wants to prove to her family that she can stay clean. She otherwise will and to go to her mother's if she cannot obtain admission to [facility]. She denies any active withdrawal symptoms other than some mild sensations of "tingling" on her extremities. No nausea or vomiting. No shakes or tremors. No DTs. She also reports a decrease in flashbacks related to her niece's death. She denies any physical adverse side effects associated with medications and feels the Seroquel and BuSpar have been helpful.   Principal Problem: Major depressive disorder, recurrent severe without psychotic features ([place]) Diagnosis:   Patient Active Problem List   Diagnosis Date Noted  . HTN (hypertension) [I10] 12/29/2016  . COPD exacerbation ([place]) [J44.1] 12/29/2016  . Substance induced mood disorder ([place]) [F19.94] 12/29/2016  . Alcohol use disorder, severe, dependence ([place]) [F10.20] 12/27/2016  . Major depressive disorder, recurrent severe without psychotic features ([place]) [F33.2] 12/27/2016  . Prediabetes [R73.03] 07/07/2015  . Primary hypothyroidism [E03.9] 04/04/2015  . Pre-diabetes [R73.03] 04/04/2015  . Vitamin D deficiency [E55.9] 04/04/2015  . Anxiety [F41.9] 10/12/2014  . Anxiety, generalized [F41.1] 10/12/2014  . H/O: hypothyroidism [Z86.39] 10/12/2014  . Insomnia, persistent [G47.00] 10/12/2014  . Cannot sleep [G47.00] 10/12/2014  . Depression, major, recurrent, moderate ([place]) [F33.1] 10/12/2014  . Drug abuse, opioid type [  F11.10] 10/12/2014  . 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 Abdominal lipoma [D17.1] 09/15/2013   Total Time spent with patient: 30 minutes  Past Psychiatric History: depression, alcoholism.  Past Medical History:  Past  Medical History:  Diagnosis Date  . Asthma   . Hyperlipidemia   . Hypertension   . Hypothyroidism   . 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 Pancreatitis     Past Surgical History:  Procedure Laterality Date  . ABDOMINAL HYSTERECTOMY    . BREAST BIOPSY Right 2012   negative  . CHOLECYSTECTOMY    . 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 OTHER SURGICAL HISTORY     excision of lipoma  . OTHER SURGICAL HISTORY     Abdominal surgery  . THYROID SURGERY     thyroidectomy   Family History:  Family History  Problem Relation Age of Onset  . Breast cancer Sister 49  . Breast cancer Maternal Aunt 59  . Breast cancer Maternal Grandmother 60  . Breast cancer Maternal Aunt 94   Family Psychiatric  History: alcoholism. Social History:  History  Alcohol Use  . 0.0 oz/week    Comment: 2 glasses whiskey per day     History  Drug Use No    Social History   Social History  . Marital status: Married    Spouse name: N/A  . Number of children: N/A  . Years of education: N/A   Social History Main Topics  . Smoking status: Former Smoker    Quit date: 12/28/1997  . Smokeless tobacco: Never Used  . Alcohol use 0.0 oz/week     Comment: 2 glasses whiskey per day  . Drug use: No  . Sexual activity: Yes    Birth control/ protection: Surgical   Other Topics Concern  . None   Social History Narrative  . None    Sleep: Fair  Appetite:  Fair  Current Medications: Current Facility-Administered Medications  Medication Dose Route Frequency Provider Last Rate Last Dose  . acetaminophen (TYLENOL) tablet 650 mg  650 mg Oral Q6H PRN Clapacs, Madie Reno, MD   650 mg at 01/03/17 1441  . albuterol (PROVENTIL HFA;VENTOLIN HFA) 108 (90 Base) MCG/ACT inhaler 1-2 puff  1-2 puff Inhalation Daily PRN Clapacs, Madie Reno, MD   2 puff at 01/01/17 1643  . alum & mag hydroxide-simeth (MAALOX/MYLANTA) 200-200-20 MG/5ML suspension 30 mL  30 mL Oral Q4H PRN Clapacs, John T, MD      . atenolol  (TENORMIN) tablet 25 mg  25 mg Oral Daily Clapacs, Madie Reno, MD   25 mg at 01/03/17 0820  . busPIRone (BUSPAR) tablet 5 mg  5 mg Oral BID Pucilowska, Jolanta B, MD   5 mg at 01/03/17 1703  . docusate sodium (COLACE) capsule 200 mg  200 mg Oral Daily Pucilowska, Jolanta B, MD   200 mg at 01/03/17 1351  . levothyroxine (SYNTHROID, LEVOTHROID) tablet 112 mcg  112 mcg Oral QAC breakfast Clapacs, Madie Reno, MD   112 mcg at 01/04/17 [phone]  . magnesium hydroxide (MILK OF MAGNESIA) suspension 30 mL  30 mL Oral Daily PRN Clapacs, John T, MD      . pantoprazole (PROTONIX) EC tablet 40 mg  40 mg Oral Daily Pucilowska, Jolanta B, MD   40 mg at 01/03/17 0817  . polyethylene glycol (MIRALAX / GLYCOLAX) packet 17 g  17 g Oral Daily Pucilowska, Jolanta B, MD   17 g at 01/03/17 1350  . prazosin (MINIPRESS)  capsule 2 mg  2 mg Oral Q breakfast Pucilowska, Jolanta B, MD   2 mg at 01/03/17 0820  . pseudoephedrine (SUDAFED) 12 hr tablet 120 mg  120 mg Oral BID Pucilowska, Jolanta B, MD   120 mg at 01/03/17 1703  . QUEtiapine (SEROQUEL) tablet 200 mg  200 mg Oral QHS Pucilowska, Jolanta B, MD   200 mg at 01/03/17 2108  . QUEtiapine (SEROQUEL) tablet 25 mg  25 mg Oral TID Pucilowska, Jolanta B, MD   25 mg at 01/03/17 1703  . thiamine (VITAMIN B-1) tablet 100 mg  100 mg Oral Daily Clapacs, John T, MD   100 mg at 01/03/17 2595   Or  . thiamine (B-1) injection 100 mg  100 mg Intravenous Daily Clapacs, John T, MD      . venlafaxine XR (EFFEXOR-XR) 24 hr capsule 150 mg  150 mg Oral Q breakfast Pucilowska, Jolanta B, MD   150 mg at 01/03/17 0817  . Vitamin D (Ergocalciferol) (DRISDOL) capsule 50,000 Units  50,000 Units Oral Weekly Clapacs, Madie Reno, MD   50,000 Units at 01/03/17 6387    Lab Results:  No results found for this or any previous visit (from the past 48 hour(s)).  Blood Alcohol level:  No results found for: [facility]  Metabolic Disorder Labs: Lab Results  Component Value Date   HGBA1C 5.7 (H) 12/29/2016   MPG 116.89  12/29/2016   MPG 123 (H) 06/27/2015   No results found for: PROLACTIN Lab Results  Component Value Date   CHOL 283 (H) 12/29/2016   TRIG 170 (H) 12/29/2016   HDL 47 12/29/2016   CHOLHDL 6.0 12/29/2016   VLDL 34 12/29/2016   LDLCALC 202 (H) 12/29/2016     Musculoskeletal: Strength & Muscle Tone: within normal limits Gait & Station: normal Patient leans: N/A  Psychiatric Specialty Exam: Physical Exam  Nursing note and vitals reviewed. Psychiatric: Her speech is normal. Her mood appears anxious. She is withdrawn. Cognition and memory are normal. She expresses impulsivity. She exhibits a depressed mood. She expresses suicidal ideation. She expresses suicidal plans.    Review of Systems  Constitutional: Negative.   HENT: Negative.   Eyes: Negative.   Respiratory: Negative.   Cardiovascular: Negative.   Gastrointestinal: Negative.   Genitourinary: Negative.   Musculoskeletal: Negative.   Skin: Negative.   Neurological:       Tingling sensation on her skin that she believes is related to alcohol withdrawal. No other neurological symptoms.  Endo/Heme/Allergies: Negative.     Blood pressure (!) 146/83, pulse 87, temperature 97.7 F (36.5 C), temperature source Oral, resp. rate 18, height 5' 6" (1.676 m), weight 76.2 kg (168 lb), SpO2 98 %.Body mass index is 27.12 kg/m.  General Appearance: Casual  Eye Contact:  Good  Speech:  Clear and Coherent  Volume:  Normal  Mood:  I'm feeling a little better  Affect:  Blunt  Thought Process:  Goal Directed and Descriptions of Associations: Intact  Orientation:  Full (Time, Place, and Person)  Thought Content:  WDL  Suicidal Thoughts:  Yes.  with intent/plan  Homicidal Thoughts:  No  Memory:  Immediate;   Fair Recent;   Fair Remote;   Fair  Judgement:  Poor  Insight:  Lacking  Psychomotor Activity:  Psychomotor Retardation  Concentration:  Concentration: Fair and Attention Span: Fair  Recall:  [employer] of Knowledge:  Fair   Language:  Fair  Akathisia:  No  Handed:  Right  AIMS (if indicated):  Assets:  Communication Skills Desire for Improvement Financial Resources/Insurance Physical Health Resilience  ADL's:  Intact  Cognition:  WNL  Sleep:  Number of Hours: 5.45     Treatment Plan Summary: Daily contact with patient to assess and evaluate symptoms and progress in treatment and Medication management   Ms. Ihnen is a 51 year old female with a history of depression and alcoholism admitted for suicidal ideation with a plan to overdose in the context of treatment noncompliance and relapse on alcohol.  1. Suicidal ideation. The patient is able to contract for safety in the hospital.  2. Mood.  She was restarted on Effexor XR 150 mg by mouth daily and BuSpar 5 mg by mouth twice a day. No additions Seroquel 200 mg by mouth nightly and 25 mg by mouth 3 times a day was added for mood stabilization and help with insomnia. The patient is also on prazosin 2 mg by mouth daily for PTSD symptoms related to her niece's death. Her niece died of cerebral palsy.  3. Alcohol abuse. She completed Librium taper.  No current active withdrawal symptoms. The patient is committed to residential substance abuse treatment. She was advised to abstain from alcohol and also drugs as they may worsen mood symptoms.  4. Substance abuse treatment. The patient is now interested in residential treatment and pharmacotherapy for alcoholism.  5. Insomnia.  Sleep has improved with the Seroquel.  6. Headaches. No headaches reported.   7. Hypothyroidism. She is on Synthroid. TSH was 3.9. She does need to follow up with her PCP.  8. COPD. She is on Albuterol.  9. HTN. She is on Atenolol.  10. Vit D defficiency. She is on supplement.  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  12. Constipation. Colace and Miralax were started.  13. Disposition. TBE. Referral to [facility] was  made. She will be discharged with her mother. The family refused meeting.   Jay Schlichter, MD 01/04/2017, 6:53 AM

## 2017-01-04 NOTE — Progress Notes (Signed)
CSW spoke with Vonna Kotyk in admission at [facility].  Pt is declined for admission due to her mental instability: with her frequent SI they do not feel they could guarantee her safety.  He recommended calling a placement specialist: Toy Baker, [phone].  CSW and pt then contacted [employer].  She was quite helpful but shared that the closest in network treatment center for [place] would be near [place].  She asked for pt insurance information and will check benefits and contact [facility] treatment center and call back today. Winferd Humphrey, MSW, LCSW Clinical Social Worker 01/04/2017 1:33 PM

## 2017-01-04 NOTE — Plan of Care (Signed)
Problem: Activity: Goal: Sleeping patterns will improve Outcome: Progressing Patient slept for Estimated Hours of 5.45; Precautionary checks every 15 minutes for safety maintained, room free of safety hazards, patient sustains no injury or falls during this shift.    

## 2017-01-05 MED ORDER — ACETAMINOPHEN 500 MG PO TABS
1000.0000 mg | ORAL_TABLET | Freq: Three times a day (TID) | ORAL | Status: DC | PRN
  Administered 2017-01-05 – 2017-01-07 (×4): 1000 mg via ORAL
  Filled 2017-01-05 (×4): qty 2

## 2017-01-05 MED ORDER — MOMETASONE FURO-FORMOTEROL FUM 100-5 MCG/ACT IN AERO
2.0000 | INHALATION_SPRAY | Freq: Two times a day (BID) | RESPIRATORY_TRACT | Status: DC
  Administered 2017-01-05 – 2017-01-07 (×4): 2 via RESPIRATORY_TRACT
  Filled 2017-01-05: qty 8.8
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
## 2017-01-05 NOTE — [facility] Group Notes (Signed)
[facility] Group Notes:  (Nursing/MHT/Case Management/Adjunct)  Date:  01/05/2017  Time:  4:55 AM  Type of Therapy:  Psychoeducational Skills  Participation Level:  Active  Participation Quality:  Appropriate and Sharing  Affect:  Appropriate  Cognitive:  Appropriate  Insight:  Appropriate and Good  Engagement in Group:  Engaged  Modes of Intervention:  Discussion, Socialization and Support  Summary of Progress/Problems:  Jenna Tyler 01/05/2017, 4:55 AM

## 2017-01-05 NOTE — Plan of Care (Signed)
Problem: Self-Concept: Goal: Ability to identify factors that promote anxiety will improve Outcome: Progressing Pt will verbalize factors that is causing increased anxiety this shift

## 2017-01-05 NOTE — Progress Notes (Signed)
D: Patient stated slept good last night .Stated appetite is good and energy level  Is normal. Stated concentration is good . Stated on Depression scale 2 , hopeless 2 and anxiety 2 .( low 0-10 high) Denies suicidal  homicidal ideations  .  No auditory hallucinations  No pain concerns . Appropriate ADL'S. Interacting with peers and staff.  Voice  Of goal going to Rehab and working  On this . Patient noted  Very needy this shift . Interacting  With peers  A: Encourage patient participation with unit programming . Instruction  Given on  Medication , verbalize understanding. R: Voice no other concerns. Staff continue to monitor
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
## 2017-01-05 NOTE — Progress Notes (Signed)
Pt in a pleasant mood. Pt interacting with peers, playing cards. Pt denies SI, HI or A/V hallucinations. Pt is med compliant. Pt contracted to safety. 15 min safety checks continues.
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
## 2017-01-05 NOTE — Plan of Care (Signed)
Problem: Education: Goal: Knowledge of [facility] Education information/materials will improve Outcome: Progressing Able  To verbalize  Understanding  Of information received   Goal: Mental status will improve Outcome: Progressing Attending group and  Working on coping skills   Problem: Coping: Goal: Ability to verbalize frustrations and anger appropriately will improve Outcome: Progressing Appropriate verbalizing anger   Goal: Ability to demonstrate self-control will improve Outcome: Progressing No anger  Outburst   Problem: Spiritual Needs Goal: Ability to function at adequate level Appropriate  Behavior   Problem: Activity: Goal: Sleeping patterns will improve Outcome: Progressing Voice no concerns around sleep.

## 2017-01-06 MED ORDER — BUSPIRONE HCL 5 MG PO TABS: 5.0000 mg | ORAL_TABLET | Freq: Two times a day (BID) | ORAL | 1 refills | Status: DC

## 2017-01-06 MED ORDER — POLYETHYLENE GLYCOL 3350 17 G PO PACK: 17.0000 g | PACK | Freq: Every day | ORAL | 0 refills | Status: DC

## 2017-01-06 MED ORDER — BUSPIRONE HCL 5 MG PO TABS
5.0000 mg | ORAL_TABLET | Freq: Three times a day (TID) | ORAL | Status: DC
  Administered 2017-01-06 – 2017-01-07 (×4): 5 mg via ORAL
  Filled 2017-01-06 (×4): qty 1

## 2017-01-06 MED ORDER — QUETIAPINE FUMARATE 25 MG PO TABS: 25.0000 mg | ORAL_TABLET | Freq: Three times a day (TID) | ORAL | 1 refills | Status: DC

## 2017-01-06 MED ORDER — QUETIAPINE FUMARATE 200 MG PO TABS: 200.0000 mg | ORAL_TABLET | Freq: Every day | ORAL | 1 refills | Status: DC

## 2017-01-06 MED ORDER — VENLAFAXINE HCL ER 150 MG PO CP24: 150.0000 mg | ORAL_CAPSULE | Freq: Every day | ORAL | 1 refills | Status: DC

## 2017-01-06 MED ORDER — MOMETASONE FURO-FORMOTEROL FUM 100-5 MCG/ACT IN AERO: 2.0000 | INHALATION_SPRAY | Freq: Two times a day (BID) | RESPIRATORY_TRACT | 1 refills | Status: DC

## 2017-01-06 MED ORDER — PRAZOSIN HCL 2 MG PO CAPS
2.0000 mg | ORAL_CAPSULE | Freq: Every day | ORAL | 1 refills | Status: DC
Start: 2017-01-06 — End: 2017-01-07

## 2017-01-06 NOTE — Progress Notes (Signed)
Patient was administered 500 mg of Depakote. No adverse reactions noted. Report given to oncoming shift. On call physician [place] notified.

## 2017-01-06 NOTE — Progress Notes (Signed)
Pt is alert and oriented x 4. Pt is assertive, pt interacts appropriately with staff and peers. Pt is very pleasant. Pt was playing cards with peers, smiling and appearing to be happy. Pt denies depression, SI, HI or A/V hallucinations. Pt is med complaint. 15 min safety checks continues. Pt contracted to safety.

## 2017-01-06 NOTE — Plan of Care (Signed)
Problem: Safety: Goal: Periods of time without injury will increase Outcome: Progressing No injury reported or observed

## 2017-01-06 NOTE — Progress Notes (Signed)
Patient ID: Jenna Tyler, female   DOB: December 05, 1965, 51 y.o.   MRN: [id number] LCSW Group Therapy Note  01/06/2017 1:00pm  Type of Therapy and Topic:  Group Therapy:  Feelings around Relapse and Recovery  Participation Level:  Active   Description of Group:    Patients in this group will discuss emotions they experience before and after a relapse. They will process how experiencing these feelings, or avoidance of experiencing them, relates to having a relapse. Facilitator will guide patients to explore emotions they have related to recovery. Patients will be encouraged to process which emotions are more powerful. They will be guided to discuss the emotional reaction significant others in their lives may have to their relapse or recovery. Patients will be assisted in exploring ways to respond to the emotions of others without this contributing to a relapse.  Therapeutic Goals: 1. Patient will identify two or more emotions that lead to a relapse for them 2. Patient will identify two emotions that result when they relapse 3. Patient will identify two emotions related to recovery 4. Patient will demonstrate ability to communicate their needs through discussion and/or role plays   Summary of Patient Progress:  Pt able to meet therapeutic goals listed above. Identifies that relapse often began for her because she was feeling like her best would never be enough and she would feel like there's no use in trying because it wouldn't get her family's approval.   Therapeutic Modalities:   Cognitive Behavioral Therapy Solution-Focused Therapy Assertiveness Training Relapse Prevention Therapy   August Saucer, LCSW 01/06/2017 2:53 PM

## 2017-01-06 NOTE — Progress Notes (Signed)
[facility] MD Progress Note  01/06/2017 10:40 AM Jenna Tyler  MRN:  [id number]  Subjective:   Jenna Tyler is a 51 year old alcoholic and opioid user whose family kicked her out of the house in despair but not wanting to stop using.        9/16- chart reviewed, discussed with nursing staff, pt seen. Patient was mistakenly given  500 mg of Depakote, no adverse reactions noted, will monitor. Pt reports craving and anxiety , but depression better, denies SI.  plan to d/c to rehab.   Per nursing - Patient was administered 500 mg of Depakote. No adverse reactions noted. Pt in a pleasant mood. Pt interacting with peers, playing cards. Pt denies SI, HI or A/V hallucinations. Pt is med compliant. Pt contracted to safety. 15 min safety checks continues  Principal Problem: Major depressive disorder, recurrent severe without psychotic features ([place]) Diagnosis:   Patient Active Problem List   Diagnosis Date Noted  . HTN (hypertension) [I10] 12/29/2016  . COPD exacerbation ([place]) [J44.1] 12/29/2016  . Substance induced mood disorder ([place]) [F19.94] 12/29/2016  . Alcohol use disorder, severe, dependence ([place]) [F10.20] 12/27/2016  . Major depressive disorder, recurrent severe without psychotic features ([place]) [F33.2] 12/27/2016  . Prediabetes [R73.03] 07/07/2015  . Primary hypothyroidism [E03.9] 04/04/2015  . Pre-diabetes [R73.03] 04/04/2015  . Vitamin D deficiency [E55.9] 04/04/2015  . Anxiety [F41.9] 10/12/2014  . Anxiety, generalized [F41.1] 10/12/2014  . H/O: hypothyroidism [Z86.39] 10/12/2014  . Insomnia, persistent [G47.00] 10/12/2014  . Cannot sleep [G47.00] 10/12/2014  . Depression, major, recurrent, moderate ([place]) [F33.1] 10/12/2014  . Drug abuse, opioid type [F11.10] 10/12/2014  . 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 Abdominal lipoma [D17.1] 09/15/2013   Total Time spent with patient: 30 minutes  Past Psychiatric History: depression, alcoholism.  Past  Medical History:  Past Medical History:  Diagnosis Date  . Asthma   . Hyperlipidemia   . Hypertension   . Hypothyroidism   . 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 Pancreatitis     Past Surgical History:  Procedure Laterality Date  . ABDOMINAL HYSTERECTOMY    . BREAST BIOPSY Right 2012   negative  . CHOLECYSTECTOMY    . 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 OTHER SURGICAL HISTORY     excision of lipoma  . OTHER SURGICAL HISTORY     Abdominal surgery  . THYROID SURGERY     thyroidectomy   Family History:  Family History  Problem Relation Age of Onset  . Breast cancer Sister 44  . Breast cancer Maternal Aunt 69  . Breast cancer Maternal Grandmother 60  . Breast cancer Maternal Aunt 39   Family Psychiatric  History: alcoholism. Social History:  History  Alcohol Use  . 0.0 oz/week    Comment: 2 glasses whiskey per day     History  Drug Use No    Social History   Social History  . Marital status: Married    Spouse name: N/A  . Number of children: N/A  . Years of education: N/A   Social History Main Topics  . Smoking status: Former Smoker    Quit date: 12/28/1997  . Smokeless tobacco: Never Used  . Alcohol use 0.0 oz/week     Comment: 2 glasses whiskey per day  . Drug use: No  . Sexual activity: Yes    Birth control/ protection: Surgical   Other Topics Concern  . None   Social History Narrative  . None    Sleep:  Fair  Appetite:  Fair  Current Medications: Current Facility-Administered Medications  Medication Dose Route Frequency Provider Last Rate Last Dose  . acetaminophen (TYLENOL) tablet 1,000 mg  1,000 mg Oral TID PRN Lenward Chancellor, MD   1,000 mg at 01/06/17 0518  . albuterol (PROVENTIL HFA;VENTOLIN HFA) 108 (90 Base) MCG/ACT inhaler 1-2 puff  1-2 puff Inhalation Daily PRN Clapacs, Madie Reno, MD   2 puff at 01/01/17 1643  . alum & mag hydroxide-simeth (MAALOX/MYLANTA) 200-200-20 MG/5ML suspension 30 mL  30 mL Oral Q4H PRN Clapacs,  John T, MD      . atenolol (TENORMIN) tablet 25 mg  25 mg Oral Daily Clapacs, Madie Reno, MD   25 mg at 01/06/17 0806  . busPIRone (BUSPAR) tablet 5 mg  5 mg Oral BID Pucilowska, Jolanta B, MD   5 mg at 01/06/17 0806  . docusate sodium (COLACE) capsule 200 mg  200 mg Oral Daily Pucilowska, Jolanta B, MD   200 mg at 01/06/17 0806  . levothyroxine (SYNTHROID, LEVOTHROID) tablet 112 mcg  112 mcg Oral QAC breakfast Clapacs, Madie Reno, MD   112 mcg at 01/06/17 [id number]  . magnesium hydroxide (MILK OF MAGNESIA) suspension 30 mL  30 mL Oral Daily PRN Clapacs, John T, MD      . mometasone-formoterol (DULERA) 100-5 MCG/ACT inhaler 2 puff  2 puff Inhalation BID Lenward Chancellor, MD   2 puff at 01/06/17 0807  . pantoprazole (PROTONIX) EC tablet 40 mg  40 mg Oral Daily Pucilowska, Jolanta B, MD   40 mg at 01/06/17 0806  . polyethylene glycol (MIRALAX / GLYCOLAX) packet 17 g  17 g Oral Daily Pucilowska, Jolanta B, MD   17 g at 01/06/17 0807  . prazosin (MINIPRESS) capsule 2 mg  2 mg Oral Q breakfast Pucilowska, Jolanta B, MD   2 mg at 01/06/17 0806  . pseudoephedrine (SUDAFED) 12 hr tablet 120 mg  120 mg Oral BID Pucilowska, Jolanta B, MD   120 mg at 01/06/17 0806  . QUEtiapine (SEROQUEL) tablet 200 mg  200 mg Oral QHS Pucilowska, Jolanta B, MD   200 mg at 01/05/17 2104  . QUEtiapine (SEROQUEL) tablet 25 mg  25 mg Oral TID Pucilowska, Jolanta B, MD   25 mg at 01/06/17 0808  . thiamine (VITAMIN B-1) tablet 100 mg  100 mg Oral Daily Clapacs, John T, MD   100 mg at 01/06/17 7628   Or  . thiamine (B-1) injection 100 mg  100 mg Intravenous Daily Clapacs, John T, MD      . venlafaxine XR (EFFEXOR-XR) 24 hr capsule 150 mg  150 mg Oral Q breakfast Pucilowska, Jolanta B, MD   150 mg at 01/06/17 0806  . Vitamin D (Ergocalciferol) (DRISDOL) capsule 50,000 Units  50,000 Units Oral Weekly Clapacs, Madie Reno, MD   50,000 Units at 01/03/17 3151    Lab Results:  No results found for this or any previous visit (from the past 48  hour(s)).  Blood Alcohol level:  No results found for: [facility]  Metabolic Disorder Labs: Lab Results  Component Value Date   HGBA1C 5.7 (H) 12/29/2016   MPG 116.89 12/29/2016   MPG 123 (H) 06/27/2015   No results found for: PROLACTIN Lab Results  Component Value Date   CHOL 283 (H) 12/29/2016   TRIG 170 (H) 12/29/2016   HDL 47 12/29/2016   CHOLHDL 6.0 12/29/2016   VLDL 34 12/29/2016   LDLCALC 202 (H) 12/29/2016     Musculoskeletal: Strength & Muscle  Tone: within normal limits Gait & Station: normal Patient leans: N/A  Psychiatric Specialty Exam: Physical Exam  Nursing note and vitals reviewed. Psychiatric: Her speech is normal. Her mood appears anxious. She is withdrawn. Cognition and memory are normal. She expresses impulsivity. She exhibits a depressed mood. She expresses suicidal ideation. She expresses suicidal plans.    Review of Systems  Constitutional: Negative.   HENT: Negative.   Eyes: Negative.   Respiratory: Negative.   Cardiovascular: Negative.   Gastrointestinal: Negative.   Genitourinary: Negative.   Musculoskeletal: Negative.   Skin: Negative.   Neurological:       Tingling sensation on her skin that she believes is related to alcohol withdrawal. No other neurological symptoms.  Endo/Heme/Allergies: Negative.     Blood pressure 130/88, pulse 92, temperature 98 F (36.7 C), temperature source Oral, resp. rate 18, height 5\' 6"  (1.676 m), weight 76.2 kg (168 lb), SpO2 99 %.Body mass index is 27.12 kg/m.  General Appearance: Casual  Eye Contact:  Good  Speech:  Clear and Coherent  Volume:  Normal  Mood:  anxious  Affect:  anxious  Thought Process:  Goal Directed and Descriptions of Associations: Intact  Orientation:  Full (Time, Place, and Person)  Thought Content:  WDL  Suicidal Thoughts:  denies  Homicidal Thoughts:  No  Memory:  Immediate;   Fair Recent;   Fair Remote;   Fair  Judgement:  Poor  Insight:  Lacking  Psychomotor Activity:   Psychomotor Retardation  Concentration:  Concentration: Fair and Attention Span: Fair  Recall:  [employer] of Knowledge:  Fair  Language:  Fair  Akathisia:  No  Handed:  Right  AIMS (if indicated):     Assets:  Communication Skills Desire for Improvement Financial Resources/Insurance Physical Health Resilience  ADL's:  Intact  Cognition:  WNL  Sleep:  Number of Hours: 5.45     Treatment Plan Summary: Daily contact with patient to assess and evaluate symptoms and progress in treatment and Medication management   Ms. Frese is a 51 year old female with a history of depression and alcoholism admitted for suicidal ideation with a plan to overdose in the context of treatment noncompliance and relapse on alcohol. Mood slowly improving.   Pt anxious, increase buspar for anxiety.  1. Suicidal ideation. The patient is able to contract for safety in the hospital.  2. Mood.  She was restarted on Effexor XR 150 mg by mouth daily and BuSpar 5 mg by mouth twice a day. No additions Seroquel 200 mg by mouth nightly and 25 mg by mouth 3 times a day was added for mood stabilization and help with insomnia. The patient is also on prazosin 2 mg by mouth daily for PTSD symptoms related to her niece's death. Her niece died of cerebral palsy.  3. Alcohol abuse. She completed Librium taper.  No current active withdrawal symptoms. The patient is committed to residential substance abuse treatment. She was advised to abstain from alcohol and also drugs as they may worsen mood symptoms.  4. Substance abuse treatment. The patient is now interested in residential treatment and pharmacotherapy for alcoholism.  5. Insomnia.  Sleep has improved with the Seroquel.  6. Headaches. No headaches reported.   7. Hypothyroidism. She is on Synthroid. TSH was 3.9. She does need to follow up with her PCP.  8. COPD/asthma. Dulera, She is on prn Albuterol.  9. HTN. She is on Atenolol.  10. Vit D defficiency.  She is on supplement.  11. Alcoholic  gastritis.  She was restarted on Protonix.Lipase is normal.  12. Constipation. Colace and Miralax were started.  13. Disposition. TBE. Referral to [facility] was made. She will be discharged with her mother. The family refused meeting.   Lenward Chancellor, MD 01/06/2017, 10:40 AMPatient ID: [name], female   DOB: 1965-12-06, 51 y.o.   MRN: [id number] Patient ID: Doyle Tegethoff, female   DOB: 06-22-1965, 51 y.o.   MRN: [id number]

## 2017-01-06 NOTE — Plan of Care (Signed)
Problem: Coping: Goal: Ability to verbalize frustrations and anger appropriately will improve Outcome: Progressing Pt will verbalize mood improvement for the next 3 shifts.

## 2017-01-06 NOTE — BHH Group Notes (Signed)
[facility] Group Notes:  (Nursing/MHT/Case Management/Adjunct)  Date:  01/06/2017  Time:  5:03 AM  Type of Therapy:  Psychoeducational Skills  Participation Level:  Active  Participation Quality:  Appropriate and Sharing  Affect:  Appropriate  Cognitive:  Appropriate  Insight:  Appropriate  Engagement in Group:  Engaged  Modes of Intervention:  Discussion, Socialization and Support  Summary of Progress/Problems:  Reece Agar 01/06/2017, 5:03 AM

## 2017-01-06 NOTE — Progress Notes (Signed)
Denies SI, HI, AVH. Pleasant and cooperative. Medication and group compliant. [facility]. Interacts appropriately with staff and peers.  Encouragement and support offered. Safety checks maintained. Medications given as prescribed. Pt receptive and remains safe on unit with q 15 min checks.

## 2017-01-07 MED ORDER — QUETIAPINE FUMARATE 200 MG PO TABS: 200.0000 mg | ORAL_TABLET | Freq: Every day | ORAL | 1 refills | Status: DC

## 2017-01-07 MED ORDER — BUSPIRONE HCL 5 MG PO TABS: 5.0000 mg | ORAL_TABLET | Freq: Three times a day (TID) | ORAL | 1 refills | Status: DC

## 2017-01-07 MED ORDER — QUETIAPINE FUMARATE 25 MG PO TABS: 25.0000 mg | ORAL_TABLET | Freq: Three times a day (TID) | ORAL | 1 refills | Status: DC

## 2017-01-07 MED ORDER — PRAZOSIN HCL 2 MG PO CAPS: 2.0000 mg | ORAL_CAPSULE | Freq: Every day | ORAL | 1 refills | Status: DC

## 2017-01-07 MED ORDER — VENLAFAXINE HCL ER 150 MG PO CP24: 150.0000 mg | ORAL_CAPSULE | Freq: Every day | ORAL | 1 refills | Status: DC

## 2017-01-07 NOTE — Progress Notes (Signed)
  [facility] Adult Case Management Discharge Plan :  Will you be returning to the same living situation after discharge:  No. Going to [facility] At discharge, do you have transportation home?: No.Daughter will transport. Do you have the ability to pay for your medications: Yes,  BCBS  Release of information consent forms completed and in the chart;  Patient's signature needed at discharge.  Patient to Follow up at: Follow-up Information    [facility] on 01/07/2017.   Why:  Please attend your intake appointment at [facility] at 4:00pm.   Contact information: [address] [place] [address] [phone]           Next level of care provider has access to [place] and Suicide Prevention discussed: Yes,  with daughter  Have you used any form of tobacco in the last 30 days? (Cigarettes, Smokeless Tobacco, Cigars, and/or Pipes): No  Has patient been referred to the Quitline?: N/A patient is not a smoker  Patient has been referred for addiction treatment: Yes  Joanne Chars, LCSW 01/07/2017, 2:37 PM

## 2017-01-07 NOTE — Progress Notes (Signed)
Patient ID: Jenna Tyler, female   DOB: 1965/07/23, 51 y.o.   MRN: [id number] LCSW Group Therapy Note   01/07/2017 9:30ampm   Type of Therapy and Topic:  Group Therapy:  Overcoming Obstacles   Participation Level:  Active   Description of Group:    In this group patients will be encouraged to explore what they see as obstacles to their own wellness and recovery. They will be guided to discuss their thoughts, feelings, and behaviors related to these obstacles. The group will process together ways to cope with barriers, with attention given to specific choices patients can make. Each patient will be challenged to identify changes they are motivated to make in order to overcome their obstacles. This group will be process-oriented, with patients participating in exploration of their own experiences as well as giving and receiving support and challenge from other group members.   Therapeutic Goals: 1. Patient will identify personal and current obstacles as they relate to admission. 2. Patient will identify barriers that currently interfere with their wellness or overcoming obstacles.  3. Patient will identify feelings, thought process and behaviors related to these barriers. 4. Patient will identify two changes they are willing to make to overcome these obstacles:      Summary of Patient Progress  Pt is able to meet the above therapeutic goals.  She shares she is anxious about hearing from [facility] about her admission and is also worried about how to get her things from her home since her husband is not wanting her to come back.   Therapeutic Modalities:   Cognitive Behavioral Therapy Solution Focused Therapy Motivational Interviewing Relapse Prevention Therapy  August Saucer, LCSW 01/07/2017 11:21 AM

## 2017-01-07 NOTE — BHH Suicide Risk Assessment (Signed)
[facility] Discharge Suicide Risk Assessment   Principal Problem: Major depressive disorder, recurrent severe without psychotic features [facility]) Discharge Diagnoses:  Patient Active Problem List   Diagnosis Date Noted  . HTN (hypertension) [I10] 12/29/2016  . COPD exacerbation ([place]) [J44.1] 12/29/2016  . Substance induced mood disorder ([place]) [F19.94] 12/29/2016  . Alcohol use disorder, severe, dependence ([place]) [F10.20] 12/27/2016  . Major depressive disorder, recurrent severe without psychotic features ([place]) [F33.2] 12/27/2016  . Prediabetes [R73.03] 07/07/2015  . Primary hypothyroidism [E03.9] 04/04/2015  . Pre-diabetes [R73.03] 04/04/2015  . Vitamin D deficiency [E55.9] 04/04/2015  . Anxiety [F41.9] 10/12/2014  . Anxiety, generalized [F41.1] 10/12/2014  . H/O: hypothyroidism [Z86.39] 10/12/2014  . Insomnia, persistent [G47.00] 10/12/2014  . Cannot sleep [G47.00] 10/12/2014  . Depression, major, recurrent, moderate ([place]) [F33.1] 10/12/2014  . Drug abuse, opioid type [F11.10] 10/12/2014  . 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 Abdominal lipoma [D17.1] 09/15/2013    Total Time spent with patient: 30 minutes  Musculoskeletal: Strength & Muscle Tone: within normal limits Gait & Station: normal Patient leans: N/A  Psychiatric Specialty Exam: Review of Systems  Psychiatric/Behavioral: Positive for substance abuse.  All other systems reviewed and are negative.   Blood pressure (!) 141/98, pulse 84, temperature 98.2 F (36.8 C), temperature source Oral, resp. rate 18, height 5\' 6"  (1.676 m), weight 76.2 kg (168 lb), SpO2 99 %.Body mass index is 27.12 kg/m.  General Appearance: Casual  Eye Contact::  Good  Speech:  Clear and Coherent409  Volume:  Normal  Mood:  Anxious  Affect:  Appropriate  Thought Process:  Goal Directed and Descriptions of Associations: Intact  Orientation:  Full (Time, Place, and Person)  Thought Content:  WDL  Suicidal Thoughts:  No   Homicidal Thoughts:  No  Memory:  Immediate;   Fair Recent;   Fair Remote;   Fair  Judgement:  Poor  Insight:  Lacking  Psychomotor Activity:  Normal  Concentration:  Fair  Recall:  [employer] of Knowledge:Fair  Language: Fair  Akathisia:  No  Handed:  Right  AIMS (if indicated):     Assets:  Communication Skills Desire for Improvement Financial Resources/Insurance Physical Health Resilience Social Support  Sleep:  Number of Hours: 8  Cognition: WNL  ADL's:  Intact   Mental Status Per Nursing Assessment::   On Admission:  NA  Demographic Factors:  Caucasian, Low socioeconomic status and Unemployed  Loss Factors: Loss of significant relationship and Financial problems/change in socioeconomic status  Historical Factors: Prior suicide attempts, Family history of mental illness or substance abuse and Impulsivity  Risk Reduction Factors:   Sense of responsibility to family, Living with another person, especially a relative and Positive social support  Continued Clinical Symptoms:  Depression:   Comorbid alcohol abuse/dependence Impulsivity Alcohol/Substance Abuse/Dependencies  Cognitive Features That Contribute To Risk:  None    Suicide Risk:  Minimal: No identifiable suicidal ideation.  Patients presenting with no risk factors but with morbid ruminations; may be classified as minimal risk based on the severity of the depressive symptoms    Plan Of Care/Follow-up recommendations:  Activity:  as tolerated.  Diet:  low sodium heart healthy. Other:  keep follow uo appointments.  Orson Slick, MD 01/07/2017, 9:58 AM

## 2017-01-07 NOTE — Progress Notes (Signed)
[facility] MD Progress Note  01/07/2017 9:43 AM Jenna Tyler  MRN:  [id number]  Subjective:   Jenna Tyler is a 51 year old alcoholic and opioid user whose family kicked Jenna Tyler out of the house in despair but not wanting to stop using.   9/16- chart reviewed, discussed with nursing staff, pt seen. Jenna Tyler was mistakenly given  500 mg of Depakote, no adverse reactions noted, will monitor. Pt reports craving and anxiety , but depression better, denies SI.  plan to d/c to rehab.  01/07/2017. Jenna Tyler has not been suicidal at least since last Friday. Jenna Tyler no longer threatens suicida following discharge and is able to contract for safety. Jenna Tyler is forward thinking and optimistic about the future. Jenna Tyler is now very interested in going to rehab and seems to be committed to sobriety. Jenna Tyler accepts medications and tolerates them well. Sleep and appetite are fair. There are no somatic complaints.   Per nursing - Pt is alert and oriented x 4. Pt is assertive, pt interacts appropriately with staff and peers. Pt is very pleasant. Pt was playing cards with peers, smiling and appearing to be happy. Pt denies depression, SI, HI or A/V hallucinations. Pt is med complaint. 15 min safety checks continues. Pt contracted to safety.  Principal Problem: Major depressive disorder, recurrent severe without psychotic features ([place]) Diagnosis:   Jenna Tyler Active Problem List   Diagnosis Date Noted  . HTN (hypertension) [I10] 12/29/2016  . COPD exacerbation ([place]) [J44.1] 12/29/2016  . Substance induced mood disorder ([place]) [F19.94] 12/29/2016  . Alcohol use disorder, severe, dependence ([place]) [F10.20] 12/27/2016  . Major depressive disorder, recurrent severe without psychotic features ([place]) [F33.2] 12/27/2016  . Prediabetes [R73.03] 07/07/2015  . Primary hypothyroidism [E03.9] 04/04/2015  . Pre-diabetes [R73.03] 04/04/2015  . Vitamin D deficiency [E55.9] 04/04/2015  . Anxiety [F41.9] 10/12/2014  . Anxiety, generalized [F41.1]  10/12/2014  . H/O: hypothyroidism [Z86.39] 10/12/2014  . Insomnia, persistent [G47.00] 10/12/2014  . Cannot sleep [G47.00] 10/12/2014  . Depression, major, recurrent, moderate ([place]) [F33.1] 10/12/2014  . Drug abuse, opioid type [F11.10] 10/12/2014  . 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 Abdominal lipoma [D17.1] 09/15/2013   Total Time spent with Jenna Tyler: 30 minutes  Past Psychiatric History: depression, alcoholism.  Past Medical History:  Past Medical History:  Diagnosis Date  . Asthma   . Hyperlipidemia   . Hypertension   . Hypothyroidism   . 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 Pancreatitis     Past Surgical History:  Procedure Laterality Date  . ABDOMINAL HYSTERECTOMY    . BREAST BIOPSY Right 2012   negative  . CHOLECYSTECTOMY    . 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 OTHER SURGICAL HISTORY     excision of lipoma  . OTHER SURGICAL HISTORY     Abdominal surgery  . THYROID SURGERY     thyroidectomy   Family History:  Family History  Problem Relation Age of Onset  . Breast cancer Sister 41  . Breast cancer Maternal Aunt 81  . Breast cancer Maternal Grandmother 60  . Breast cancer Maternal Aunt 26   Family Psychiatric  History: alcoholism. Social History:  History  Alcohol Use  . 0.0 oz/week    Comment: 2 glasses whiskey per day     History  Drug Use No    Social History   Social History  . Marital status: Married    Spouse name: N/A  . Number of children: N/A  . Years of education: N/A  Social History Main Topics  . Smoking status: Former Smoker    Quit date: 12/28/1997  . Smokeless tobacco: Never Used  . Alcohol use 0.0 oz/week     Comment: 2 glasses whiskey per day  . Drug use: No  . Sexual activity: Yes    Birth control/ protection: Surgical   Other Topics Concern  . None   Social History Narrative  . None    Sleep: Fair  Appetite:  Fair  Current Medications: Current Facility-Administered  Medications  Medication Dose Route Frequency Provider Last Rate Last Dose  . acetaminophen (TYLENOL) tablet 1,000 mg  1,000 mg Oral TID PRN Lenward Chancellor, MD   1,000 mg at 01/07/17 0631  . albuterol (PROVENTIL HFA;VENTOLIN HFA) 108 (90 Base) MCG/ACT inhaler 1-2 puff  1-2 puff Inhalation Daily PRN Clapacs, Madie Reno, MD   2 puff at 01/01/17 1643  . alum & mag hydroxide-simeth (MAALOX/MYLANTA) 200-200-20 MG/5ML suspension 30 mL  30 mL Oral Q4H PRN Clapacs, John T, MD      . atenolol (TENORMIN) tablet 25 mg  25 mg Oral Daily Clapacs, Madie Reno, MD   25 mg at 01/07/17 0807  . busPIRone (BUSPAR) tablet 5 mg  5 mg Oral TID Lenward Chancellor, MD   5 mg at 01/07/17 0807  . docusate sodium (COLACE) capsule 200 mg  200 mg Oral Daily Tenna Lacko B, MD   200 mg at 01/06/17 0806  . levothyroxine (SYNTHROID, LEVOTHROID) tablet 112 mcg  112 mcg Oral QAC breakfast Clapacs, Madie Reno, MD   112 mcg at 01/07/17 0631  . magnesium hydroxide (MILK OF MAGNESIA) suspension 30 mL  30 mL Oral Daily PRN Clapacs, John T, MD      . mometasone-formoterol (DULERA) 100-5 MCG/ACT inhaler 2 puff  2 puff Inhalation BID Lenward Chancellor, MD   2 puff at 01/07/17 0809  . pantoprazole (PROTONIX) EC tablet 40 mg  40 mg Oral Daily Jacarius Handel B, MD   40 mg at 01/07/17 0807  . polyethylene glycol (MIRALAX / GLYCOLAX) packet 17 g  17 g Oral Daily Phoenix Dresser B, MD   17 g at 01/06/17 0807  . prazosin (MINIPRESS) capsule 2 mg  2 mg Oral Q breakfast Mandell Pangborn B, MD   2 mg at 01/07/17 0808  . pseudoephedrine (SUDAFED) 12 hr tablet 120 mg  120 mg Oral BID Enas Winchel B, MD   120 mg at 01/07/17 0809  . QUEtiapine (SEROQUEL) tablet 200 mg  200 mg Oral QHS Adhira Jamil B, MD   200 mg at 01/06/17 2107  . QUEtiapine (SEROQUEL) tablet 25 mg  25 mg Oral TID Jaishon Krisher B, MD   25 mg at 01/07/17 0807  . thiamine (VITAMIN B-1) tablet 100 mg  100 mg Oral Daily Clapacs, John T, MD   100 mg at 01/07/17  6644   Or  . thiamine (B-1) injection 100 mg  100 mg Intravenous Daily Clapacs, John T, MD      . venlafaxine XR (EFFEXOR-XR) 24 hr capsule 150 mg  150 mg Oral Q breakfast Surya Folden B, MD   150 mg at 01/07/17 0807  . Vitamin D (Ergocalciferol) (DRISDOL) capsule 50,000 Units  50,000 Units Oral Weekly Clapacs, Madie Reno, MD   50,000 Units at 01/03/17 0347    Lab Results:  No results found for this or any previous visit (from the past 48 hour(s)).  Blood Alcohol level:  No results found for: [facility]  Metabolic Disorder Labs: Lab Results  Component Value Date   HGBA1C 5.7 (H) 12/29/2016   MPG 116.89 12/29/2016   MPG 123 (H) 06/27/2015   No results found for: PROLACTIN Lab Results  Component Value Date   CHOL 283 (H) 12/29/2016   TRIG 170 (H) 12/29/2016   HDL 47 12/29/2016   CHOLHDL 6.0 12/29/2016   VLDL 34 12/29/2016   LDLCALC 202 (H) 12/29/2016     Musculoskeletal: Strength & Muscle Tone: within normal limits Gait & Station: normal Jenna Tyler leans: N/A  Psychiatric Specialty Exam: Physical Exam  Nursing note and vitals reviewed. Psychiatric: Jenna Tyler speech is normal and behavior is normal. Thought content normal. Jenna Tyler mood appears anxious. Cognition and memory are normal. Jenna Tyler expresses impulsivity.    Review of Systems  Neurological:       Tingling sensation on Jenna Tyler skin that Jenna Tyler believes is related to alcohol withdrawal. No other neurological symptoms.  Psychiatric/Behavioral: Positive for substance abuse.  All other systems reviewed and are negative.   Blood pressure (!) 141/98, pulse 84, temperature 98.2 F (36.8 C), temperature source Oral, resp. rate 18, height 5\' 6"  (1.676 m), weight 76.2 kg (168 lb), SpO2 99 %.Body mass index is 27.12 kg/m.  General Appearance: Casual  Eye Contact:  Good  Speech:  Clear and Coherent  Volume:  Normal  Mood:  anxious  Affect:  anxious  Thought Process:  Goal Directed and Descriptions of Associations: Intact  Orientation:  Full  (Time, Place, and Person)  Thought Content:  WDL  Suicidal Thoughts:  denies  Homicidal Thoughts:  No  Memory:  Immediate;   Fair Recent;   Fair Remote;   Fair  Judgement:  Poor  Insight:  Lacking  Psychomotor Activity:  Psychomotor Retardation  Concentration:  Concentration: Fair and Attention Span: Fair  Recall:  [employer] of Knowledge:  Fair  Language:  Fair  Akathisia:  No  Handed:  Right  AIMS (if indicated):     Assets:  Communication Skills Desire for Improvement Financial Resources/Insurance Physical Health Resilience  ADL's:  Intact  Cognition:  WNL  Sleep:  Number of Hours: 8     Treatment Plan Summary: Daily contact with Jenna Tyler to assess and evaluate symptoms and progress in treatment and Medication management   Ms. Hafer is a 51 year old female with a history of depression and alcoholism admitted for suicidal ideation with a plan to overdose in the context of treatment noncompliance and relapse on alcohol.  1. Suicidal ideation. Resolved.   2. Mood.  Jenna Tyler was restarted on Effexor and BuSpar. We added Seroquel for anxiety and mood stabilization. The Jenna Tyler is also on prazosin for flashbacks of PTSD.   3. Alcohol abuse. Jenna Tyler completed Librium taper. This was uncomplicated detox.   4. Substance abuse treatment. The Jenna Tyler is now interested in residential treatment and pharmacotherapy for alcoholism.   5. Insomnia.  Sleep has improved with the Seroquel.  6. Headaches. No headaches reported.   7. Hypothyroidism. Jenna Tyler is on Synthroid. TSH was 3.9.   8. Asthma. Jenna Tyler is on Dulera and Albuterol.  9. HTN. Jenna Tyler is on Atenolol.  10. Vit D defficiency. Jenna Tyler is on supplement.  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  Jenna Tyler was restarted on Protonix. Lipase is normal.  12. Constipation. Jenna Tyler is on Colace and Miralax.  13. Disposition. The Jenna Tyler will be discharged with family to hopefully continue treatment at the [facility] rehab facility in [place].     Orson Slick, MD

## 2017-01-07 NOTE — Discharge Summary (Addendum)
Physician Discharge Summary Note  Patient:  Jenna Tyler is an 51 y.o., female MRN:  [id number] DOB:  [birth year] Patient phone:  [phone] (home)  Patient address:   [address] [place] [address],  Total Time spent with patient: 30 minutes  Date of Admission:  12/28/2016 Date of Discharge: 01/07/2017  Reason for Admission:  Suicidal ideation.  Identifying data. [name] a 51 year old femaleWith a history of depression and alcoholism.   Chief complaint. "My family is not supportive."   History of present illness. Information was obtained from the patient and the chart. The patient came to the emergency room on 11/25/2016 complaining for worsening of depression and suicidal ideation with a plan to overdose on medications. Has symptoms have been worsening over several months as her drinking escalated. She also started decreasing her dose of Effexor and discontinue BuSpar altogether. She reports poor sleep, decreased appetite, anhedonia, feeling of guilt and hopelessness worthlessness, poor energy and cons patient, social isolation, crying spells, heightened anxiety that culminated in suicidal thinking. The patient denies finding to hurt herself and came to the hospital instead. She has a history of alcoholism but her drinking escalated in the past 12 months. She reports that recently she has been drinking a fifth of 3 days. This created conflict at home to the point that the patient is no longer sure that she will be allowed to return home with family. She denies psychotic symptoms or symptoms suggestive of bipolar mania. She reports anxiety especially while withdrawing from alcohol. She doesn't use other drugs and was negative for substances on admission but claims that she has been prescribed narcotic painkillers for face pain. Unfortunately her blood alcohol level was not checked on admission.   Past psychiatric history. She was hospitalized several times here in [place]   Similar scenario. She reports that while on BuSpar and Effexor she does better. She had several suicide attempts by overdose when she was a teenager. She has never been in substance abuse treatment.   Family psychiatric history. Her brother is a recovered alcoholic.  Social history. She lives with her husband, the "twins" and a grandchild. She is a stay-at-home and takes care of her grand baby while her daughter goes to college. The situation at home is very tense because of her drinking. She feels that she has no support.  Principal Problem: Major depressive disorder, recurrent severe without psychotic features [facility]) Discharge Diagnoses: Patient Active Problem List   Diagnosis Date Noted  . HTN (hypertension) [I10] 12/29/2016  . COPD exacerbation ([place]) [J44.1] 12/29/2016  . Substance induced mood disorder ([place]) [F19.94] 12/29/2016  . Alcohol use disorder, severe, dependence ([place]) [F10.20] 12/27/2016  . Major depressive disorder, recurrent severe without psychotic features ([place]) [F33.2] 12/27/2016  . Prediabetes [R73.03] 07/07/2015  . Primary hypothyroidism [E03.9] 04/04/2015  . Pre-diabetes [R73.03] 04/04/2015  . Vitamin D deficiency [E55.9] 04/04/2015  . Anxiety [F41.9] 10/12/2014  . Anxiety, generalized [F41.1] 10/12/2014  . H/O: hypothyroidism [Z86.39] 10/12/2014  . Insomnia, persistent [G47.00] 10/12/2014  . Cannot sleep [G47.00] 10/12/2014  . Depression, major, recurrent, moderate ([place]) [F33.1] 10/12/2014  . Drug abuse, opioid type [F11.10] 10/12/2014  . 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 Abdominal lipoma [D17.1] 09/15/2013    Past Medical History:  Past Medical History:  Diagnosis Date  . Asthma   . Hyperlipidemia   . Hypertension   . Hypothyroidism   . 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 Pancreatitis     Past Surgical  History:  Procedure Laterality Date  . ABDOMINAL HYSTERECTOMY    . BREAST BIOPSY Right 2012   negative  .  CHOLECYSTECTOMY    . 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 OTHER SURGICAL HISTORY     excision of lipoma  . OTHER SURGICAL HISTORY     Abdominal surgery  . THYROID SURGERY     thyroidectomy   Family History:  Family History  Problem Relation Age of Onset  . Breast cancer Sister 63  . Breast cancer Maternal Aunt 5  . Breast cancer Maternal Grandmother 60  . Breast cancer Maternal Aunt 40    Social History:  History  Alcohol Use  . 0.0 oz/week    Comment: 2 glasses whiskey per day     History  Drug Use No    Social History   Social History  . Marital status: Married    Spouse name: N/A  . Number of children: N/A  . Years of education: N/A   Social History Main Topics  . Smoking status: Former Smoker    Quit date: 12/28/1997  . Smokeless tobacco: Never Used  . Alcohol use 0.0 oz/week     Comment: 2 glasses whiskey per day  . Drug use: No  . Sexual activity: Yes    Birth control/ protection: Surgical   Other Topics Concern  . None   Social History Narrative  . None    Hospital Course:    [name] is a 51 year old female with a history of depression and alcoholism admitted for suicidal ideation with a plan to overdose in the context of treatment noncompliance and relapse on alcohol. She is not allowed to return to family home and the husband asked for divorce.  #. Suicidal ideation. Resolved. The patient is no longer acutely suicidal.  #. Mood.  She was restarted on Effexor and BuSpa. We added Seroquel 200 mg at nightly and 25 mg 3 times a day for mood stabilization and help with insomnia.  #. Anxiety. The patient is also on prazosin for flashbacks of PTSD.  #. Alcohol abuse. She completed Librium taper. This was uncomplicated detox. The patient is committed to residential substance abuse treatment. She was advised to abstain from alcohol and also drugs as they may worsen mood symptoms.  #. Substance abuse treatment. The patient is now interested in  residential treatment and pharmacotherapy for alcoholism. We will give Antabuse if discharged to home. She was referred to [facility]. She has not been accepted due to suicidal threats.  #. Insomnia.  Improved with the Seroquel.  #. Headaches. No headaches reported.   #. Hypothyroidism. She is on Synthroid. TSH was 3.9. She does need to follow up with her PCP.  #. Asthma. Inhalers are available.   #. HTN. She is on Atenolol.  #. Vit D defficiency. She is on supplement.  #. Alcoholic gastritis.  She was restarted on Protonix. Lipase is normal.  #. Constipation. Colace and Miralax were started.  #. Disposition. The patient will be discharged to home with her mother. She is awaiting rehab bed. The family refused family meeting.   Physical Findings: AIMS:  , ,  ,  ,    CIWA:  CIWA-Ar Total: 0 COWS:     Musculoskeletal: Strength & Muscle Tone: within normal limits Gait & Station: normal Patient leans: N/A  Psychiatric Specialty Exam: Physical Exam  Nursing note and vitals reviewed. Psychiatric: Her speech is normal and behavior is normal. Thought content normal. Her mood appears  anxious. Cognition and memory are normal. She expresses impulsivity.    Review of Systems  Neurological: Positive for headaches.  Psychiatric/Behavioral: Positive for substance abuse.  All other systems reviewed and are negative.   Blood pressure (!) 141/98, pulse 84, temperature 98.2 F (36.8 C), temperature source Oral, resp. rate 18, height 5\' 6"  (1.676 m), weight 76.2 kg (168 lb), SpO2 99 %.Body mass index is 27.12 kg/m.  General Appearance: Casual  Eye Contact:  Good  Speech:  Clear and Coherent  Volume:  Normal  Mood:  Anxious  Affect:  Appropriate  Thought Process:  Goal Directed and Descriptions of Associations: Intact  Orientation:  Full (Time, Place, and Person)  Thought Content:  WDL  Suicidal Thoughts:  No  Homicidal Thoughts:  No  Memory:  Immediate;   Fair Recent;    Fair Remote;   Fair  Judgement:  Poor  Insight:  Lacking  Psychomotor Activity:  Normal  Concentration:  Concentration: Fair and Attention Span: Fair  Recall:  [employer] of Knowledge:  Fair  Language:  Fair  Akathisia:  No  Handed:  Right  AIMS (if indicated):     Assets:  Communication Skills Desire for Improvement Financial Resources/Insurance Physical Health Resilience Social Support  ADL's:  Intact  Cognition:  WNL  Sleep:  Number of Hours: 8     Have you used any form of tobacco in the last 30 days? (Cigarettes, Smokeless Tobacco, Cigars, and/or Pipes): No  Has this patient used any form of tobacco in the last 30 days? (Cigarettes, Smokeless Tobacco, Cigars, and/or Pipes) Yes, No  Blood Alcohol level:  No results found for: [facility]  Metabolic Disorder Labs:  Lab Results  Component Value Date   HGBA1C 5.7 (H) 12/29/2016   MPG 116.89 12/29/2016   MPG 123 (H) 06/27/2015   No results found for: PROLACTIN Lab Results  Component Value Date   CHOL 283 (H) 12/29/2016   TRIG 170 (H) 12/29/2016   HDL 47 12/29/2016   CHOLHDL 6.0 12/29/2016   VLDL 34 12/29/2016   LDLCALC 202 (H) 12/29/2016    See Psychiatric Specialty Exam and Suicide Risk Assessment completed by Attending Physician prior to discharge.  Discharge destination:  Home  Is patient on multiple antipsychotic therapies at discharge:  No   Has Patient had three or more failed trials of antipsychotic monotherapy by history:  No  Recommended Plan for Multiple Antipsychotic Therapies: NA  Discharge Instructions    Diet - low sodium heart healthy    Complete by:  As directed    Increase activity slowly    Complete by:  As directed      Allergies as of 01/07/2017      Reactions   Nsaids Rash   Internal bleeding  Intestinal bleeding   Adhesive [tape] Rash   Aspirin Rash   Penicillins Rash      Medication List    STOP taking these medications   oxyCODONE-acetaminophen 5-325 MG tablet Commonly  known as:  PERCOCET/ROXICET   promethazine 12.5 MG tablet Commonly known as:  PHENERGAN   tiZANidine 4 MG tablet Commonly known as:  ZANAFLEX   venlafaxine 75 MG tablet Commonly known as:  EFFEXOR Replaced by:  venlafaxine XR 150 MG 24 hr capsule     TAKE these medications     Indication  amitriptyline 10 MG tablet Commonly known as:  ELAVIL Take 2 tablets by mouth daily.  Indication:  Headache   atenolol 25 MG tablet Commonly known as:  TENORMIN Take 25 mg by mouth at bedtime.  Indication:  Migraine Headache   busPIRone 5 MG tablet Commonly known as:  BUSPAR Take 1 tablet (5 mg total) by mouth 3 (three) times daily.  Indication:  Anxiety Disorder, Major Depressive Disorder   levothyroxine 112 MCG tablet Commonly known as:  SYNTHROID, LEVOTHROID Take 112 mcg by mouth daily.  Indication:  Underactive Thyroid   mometasone-formoterol 100-5 MCG/ACT Aero Commonly known as:  DULERA Inhale 2 puffs into the lungs 2 (two) times daily.  Indication:  Asthma   polyethylene glycol packet Commonly known as:  MIRALAX / GLYCOLAX Take 17 g by mouth daily.  Indication:  Constipation   prazosin 2 MG capsule Commonly known as:  MINIPRESS Take 1 capsule (2 mg total) by mouth daily with breakfast.  Indication:  PTSD   QUEtiapine 200 MG tablet Commonly known as:  SEROQUEL Take 1 tablet (200 mg total) by mouth at bedtime.  Indication:  Depressive Phase of Manic-Depression   QUEtiapine 25 MG tablet Commonly known as:  SEROQUEL Take 1 tablet (25 mg total) by mouth 3 (three) times daily.  Indication:  Depressive Phase of Manic-Depression   ranitidine 150 MG tablet Commonly known as:  ZANTAC Take 150 mg by mouth 2 (two) times daily.  Indication:  Gastroesophageal Reflux Disease   SYMBICORT 160-4.5 MCG/ACT inhaler Generic drug:  budesonide-formoterol Inhale 2 puffs into the lungs 2 (two) times daily.  Indication:  Asthma   venlafaxine XR 150 MG 24 hr capsule Commonly known  as:  EFFEXOR-XR Take 1 capsule (150 mg total) by mouth daily with breakfast. Replaces:  venlafaxine 75 MG tablet  Indication:  Major Depressive Disorder   VENTOLIN HFA 108 (90 Base) MCG/ACT inhaler Generic drug:  albuterol Inhale into the lungs.  Indication:  Asthma   Vitamin D (Ergocalciferol) 50000 units Caps capsule Commonly known as:  DRISDOL Take 1 capsule by mouth once a week.  Indication:  Osteoporosis        Follow-up recommendations:  Activity:  as tolerated. Diet:  low sodium heart healthy. Other:  keep follow up appointments.  Comments:    Signed: Orson Slick, MD 01/07/2017, 9:58 AM

## 2017-01-07 NOTE — Progress Notes (Signed)
Patient denies SI/HI, denies A/V hallucinations. Patient verbalizes understanding of discharge instructions, follow up care and prescriptions. Patient given all belongings from  locker. Patient escorted out by staff, transported by family. 

## 2017-07-11 ENCOUNTER — Other Ambulatory Visit: Payer: Self-pay

## 2017-07-11 ENCOUNTER — Emergency Department: Payer: BLUE CROSS/BLUE SHIELD

## 2017-07-11 ENCOUNTER — Inpatient Hospital Stay
Admission: EM | Admit: 2017-07-11 | Discharge: 2017-07-16 | DRG: 682 | Disposition: A | Discharge status: Skilled Nursing Facility | Payer: BLUE CROSS/BLUE SHIELD | Attending: Internal Medicine | Admitting: Internal Medicine

## 2017-07-11 DIAGNOSIS — Q8500 Neurofibromatosis, unspecified: Secondary | ICD-10-CM

## 2017-07-11 DIAGNOSIS — E039 Hypothyroidism, unspecified: Secondary | ICD-10-CM

## 2017-07-11 DIAGNOSIS — Z880 Allergy status to penicillin: Secondary | ICD-10-CM

## 2017-07-11 DIAGNOSIS — Z87891 Personal history of nicotine dependence: Secondary | ICD-10-CM

## 2017-07-11 DIAGNOSIS — Z886 Allergy status to analgesic agent status: Secondary | ICD-10-CM

## 2017-07-11 DIAGNOSIS — Z7951 Long term (current) use of inhaled steroids: Secondary | ICD-10-CM

## 2017-07-11 DIAGNOSIS — G935 Compression of brain: Secondary | ICD-10-CM | POA: Diagnosis present

## 2017-07-11 DIAGNOSIS — N179 Acute kidney failure, unspecified: Principal | ICD-10-CM | POA: Diagnosis present

## 2017-07-11 DIAGNOSIS — E538 Deficiency of other specified B group vitamins: Secondary | ICD-10-CM | POA: Diagnosis present

## 2017-07-11 DIAGNOSIS — Z23 Encounter for immunization: Secondary | ICD-10-CM

## 2017-07-11 DIAGNOSIS — J45909 Unspecified asthma, uncomplicated: Secondary | ICD-10-CM | POA: Diagnosis present

## 2017-07-11 DIAGNOSIS — Z91048 Other nonmedicinal substance allergy status: Secondary | ICD-10-CM

## 2017-07-11 DIAGNOSIS — I10 Essential (primary) hypertension: Secondary | ICD-10-CM | POA: Diagnosis present

## 2017-07-11 DIAGNOSIS — Z79890 Hormone replacement therapy (postmenopausal): Secondary | ICD-10-CM

## 2017-07-11 DIAGNOSIS — Z79899 Other long term (current) drug therapy: Secondary | ICD-10-CM

## 2017-07-11 DIAGNOSIS — E785 Hyperlipidemia, unspecified: Secondary | ICD-10-CM | POA: Diagnosis present

## 2017-07-11 DIAGNOSIS — R531 Weakness: Secondary | ICD-10-CM | POA: Diagnosis present

## 2017-07-11 DIAGNOSIS — R339 Retention of urine, unspecified: Secondary | ICD-10-CM | POA: Diagnosis present

## 2017-07-11 LAB — BASIC METABOLIC PANEL
Anion gap: 13 (ref 5–15)
BUN: 23 mg/dL — ABNORMAL HIGH (ref 6–20)
CO2: 20 mmol/L — ABNORMAL LOW (ref 22–32)
Calcium: 8.6 mg/dL — ABNORMAL LOW (ref 8.9–10.3)
Chloride: 99 mmol/L — ABNORMAL LOW (ref 101–111)
Creatinine, Ser: 1.28 mg/dL — ABNORMAL HIGH (ref 0.44–1.00)
GFR calc Af Amer: 55 mL/min — ABNORMAL LOW (ref >60)
GFR calc non Af Amer: 48 mL/min — ABNORMAL LOW (ref >60)
Glucose, Bld: 85 mg/dL (ref 65–99)
Potassium: 4.2 mmol/L (ref 3.5–5.1)
Sodium: 132 mmol/L — ABNORMAL LOW (ref 135–145)

## 2017-07-11 LAB — HEPATIC FUNCTION PANEL
ALT: 21 U/L (ref 14–54)
AST: 23 U/L (ref 15–41)
Albumin: 4.3 g/dL (ref 3.5–5.0)
Alkaline Phosphatase: 89 U/L (ref 38–126)
Bilirubin, Direct: <0.1 mg/dL — ABNORMAL LOW (ref 0.1–0.5)
Total Bilirubin: 0.5 mg/dL (ref 0.3–1.2)
Total Protein: 7.4 g/dL (ref 6.5–8.1)

## 2017-07-11 LAB — CBC
HCT: 45.4 % (ref 35.0–47.0)
Hemoglobin: 15.6 g/dL (ref 12.0–16.0)
MCH: 27.8 pg (ref 26.0–34.0)
MCHC: 34.3 g/dL (ref 32.0–36.0)
MCV: 80.9 fL (ref 80.0–100.0)
Platelets: 282 10*3/uL (ref 150–440)
RBC: 5.61 MIL/uL — ABNORMAL HIGH (ref 3.80–5.20)
RDW: 14.1 % (ref 11.5–14.5)
WBC: 8.9 10*3/uL (ref 3.6–11.0)

## 2017-07-11 LAB — TROPONIN I: Troponin I: <0.03 ng/mL (ref <0.03)

## 2017-07-11 LAB — TSH: TSH: 10.204 u[IU]/mL — ABNORMAL HIGH (ref 0.350–4.500)

## 2017-07-11 LAB — CK: Total CK: 199 U/L (ref 38–234)

## 2017-07-11 MED ORDER — LORAZEPAM 2 MG/ML IJ SOLN
INTRAMUSCULAR | Status: AC
  Administered 2017-07-11: 1 mg via INTRAVENOUS
  Filled 2017-07-11: qty 1

## 2017-07-11 MED ORDER — LORAZEPAM 2 MG/ML IJ SOLN
1.0000 mg | Freq: Once | INTRAMUSCULAR | Status: AC
  Administered 2017-07-11: 1 mg via INTRAVENOUS

## 2017-07-11 NOTE — ED Provider Notes (Addendum)
[facility] Emergency Department Provider Note   ____________________________________________   First MD Initiated Contact with Patient 07/11/17 1001     (approximate)  I have reviewed the triage vital signs and the nursing notes.   HISTORY  Chief Complaint Weakness    HPI Jenna Tyler is a 52 y.o. female Patient reports she's been feeling weak and also tremulous. This is been going on for approximately 2 weeks and getting slowly worse. Today she's been unable to walk without assistance because her legs are "heavy." She's never had this before. She doesn't have any other weakness or numbness anywhere.   Past Medical History:  Diagnosis Date  . Asthma   . Hyperlipidemia   . Hypertension   . Hypothyroidism   . 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 Pancreatitis     Patient Active Problem List   Diagnosis Date Noted  . HTN (hypertension) 12/29/2016  . COPD exacerbation ([place]) 12/29/2016  . Substance induced mood disorder ([place]) 12/29/2016  . Alcohol use disorder, severe, dependence ([place]) 12/27/2016  . Major depressive disorder, recurrent severe without psychotic features ([place]) 12/27/2016  . Prediabetes 07/07/2015  . Primary hypothyroidism 04/04/2015  . Pre-diabetes 04/04/2015  . Vitamin D deficiency 04/04/2015  . Anxiety 10/12/2014  . Anxiety, generalized 10/12/2014  . H/O: hypothyroidism 10/12/2014  . Insomnia, persistent 10/12/2014  . Cannot sleep 10/12/2014  . Depression, major, recurrent, moderate ([place]) 10/12/2014  . Drug abuse, opioid type ([place]) 10/12/2014  . 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/2013    Past Surgical History:  Procedure Laterality Date  . ABDOMINAL HYSTERECTOMY    . BREAST BIOPSY Right 2012   negative  . CHOLECYSTECTOMY    . 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 OTHER SURGICAL HISTORY     excision of lipoma  . OTHER SURGICAL HISTORY     Abdominal  surgery  . THYROID SURGERY     thyroidectomy    Prior to Admission medications   Medication Sig Start Date End Date Taking? Authorizing Provider  albuterol (VENTOLIN HFA) 108 (90 Base) MCG/ACT inhaler Inhale into the lungs. 09/15/12   [provider]  amitriptyline (ELAVIL) 10 MG tablet Take 2 tablets by mouth daily. 01/17/16   [provider]  atenolol (TENORMIN) 25 MG tablet Take 25 mg by mouth at bedtime.  07/16/16   [provider]  busPIRone (BUSPAR) 5 MG tablet Take 1 tablet (5 mg total) by mouth 3 (three) times daily. 01/07/17   Pucilowska, Jolanta B, MD  levothyroxine (SYNTHROID, LEVOTHROID) 112 MCG tablet Take 112 mcg by mouth daily. 12/03/16   [provider]  mometasone-formoterol (DULERA) 100-5 MCG/ACT AERO Inhale 2 puffs into the lungs 2 (two) times daily. 01/06/17   Pucilowska, Jolanta B, MD  polyethylene glycol (MIRALAX / GLYCOLAX) packet Take 17 g by mouth daily. 01/06/17   Pucilowska, Herma Ard B, MD  prazosin (MINIPRESS) 2 MG capsule Take 1 capsule (2 mg total) by mouth daily with breakfast. 01/07/17   Pucilowska, Jolanta B, MD  QUEtiapine (SEROQUEL) 200 MG tablet Take 1 tablet (200 mg total) by mouth at bedtime. 01/07/17   Pucilowska, Herma Ard B, MD  QUEtiapine (SEROQUEL) 25 MG tablet Take 1 tablet (25 mg total) by mouth 3 (three) times daily. 01/07/17   Pucilowska, Herma Ard B, MD  ranitidine (ZANTAC) 150 MG tablet Take 150 mg by mouth 2 (two) times daily.    [provider]  SYMBICORT 160-4.5 MCG/ACT inhaler Inhale 2 puffs  into the lungs 2 (two) times daily.  10/02/16   [provider]  venlafaxine XR (EFFEXOR-XR) 150 MG 24 hr capsule Take 1 capsule (150 mg total) by mouth daily with breakfast. 01/07/17   Pucilowska, Jolanta B, MD  Vitamin D, Ergocalciferol, (DRISDOL) 50000 units CAPS capsule Take 1 capsule by mouth once a week. 10/30/16   [provider]    Allergies Nsaids; Adhesive [tape]; Aspirin; and Penicillins  Family  History  Problem Relation Age of Onset  . Breast cancer Sister 59  . Breast cancer Maternal Aunt 52  . Breast cancer Maternal Grandmother 60  . Breast cancer Maternal Aunt 50    Social History Social History   Tobacco Use  . Smoking status: Former Smoker    Last attempt to quit: 12/28/1997    Years since quitting: 19.5  . Smokeless tobacco: Never Used  Substance Use Topics  . Alcohol use: Not Currently    Alcohol/week: 0.0 oz  . Drug use: No    Review of Systems  Constitutional: No fever/chills Eyes: No visual changes. ENT: No sore throat. Cardiovascular: Denies chest pain. Respiratory: Denies shortness of breath. Gastrointestinal: No abdominal pain.  No nausea, no vomiting.  No diarrhea.  No constipation. Genitourinary: Negative for dysuria. Musculoskeletal: Negative for back pain. Skin: Negative for rash. Neurological: Negative for headaches or numbness ____________________________________________   PHYSICAL EXAM:  VITAL SIGNS: ED Triage Vitals  Enc Vitals Group     BP 07/11/17 1704 113/75     Pulse Rate 07/11/17 1704 98     Resp 07/11/17 1704 16     Temp 07/11/17 1704 97.6 F (36.4 C)     Temp Source 07/11/17 1704 Oral     SpO2 07/11/17 1704 94 %     Weight 07/11/17 1705 196 lb (88.9 kg)     Height 07/11/17 1705 5\' 5"  (1.651 m)     Head Circumference --      Peak Flow --      Pain Score 07/11/17 1705 0     Pain Loc --      Pain Edu? --      Excl. in [place]? --    Constitutional: Alert and oriented. Well appearing and in no acute distress. Eyes: Conjunctivae are normal. PERRL. EOMI. Head: Atraumatic. Nose: No congestion/rhinnorhea. Mouth/Throat: Mucous membranes are moist.  Oropharynx non-erythematous. Neck: No stridor. Cardiovascular: Normal rate, regular rhythm. Grossly normal heart sounds.  Good peripheral circulation. Respiratory: Normal respiratory effort.  No retractions. Lungs CTAB. Gastrointestinal: Soft and nontender. No distention. No abdominal  bruits. No CVA tenderness. Musculoskeletal: No lower extremity tenderness nor edema.  No joint effusions. Neurologic:  Normal speech and language.patient does have a slight intention tremor. She also has a lot of difficulty walking and picking up her legs. She can hold her legs off the bed for about 10 seconds although she has to work hard to do it. Skin:  Skin is warm, dry and intact. No rash noted. Psychiatric: Mood and affect are normal. Speech and behavior are normal.  ____________________________________________   LABS (all labs ordered are listed, but only abnormal results are displayed)  Labs Reviewed  BASIC METABOLIC PANEL - Abnormal; Notable for the following components:      Result Value   Sodium 132 (*)    Chloride 99 (*)    CO2 20 (*)    BUN 23 (*)    Creatinine, Ser 1.28 (*)    Calcium 8.6 (*)    GFR calc non  Af Amer 48 (*)    GFR calc Af Amer 55 (*)    All other components within normal limits  CBC - Abnormal; Notable for the following components:   RBC 5.61 (*)    All other components within normal limits  HEPATIC FUNCTION PANEL - Abnormal; Notable for the following components:   Bilirubin, Direct <0.1 (*)    All other components within normal limits  TSH - Abnormal; Notable for the following components:   TSH 10.204 (*)    All other components within normal limits  CK  TROPONIN I  URINALYSIS, COMPLETE (UACMP) WITH MICROSCOPIC  PREGNANCY, URINE  T3, FREE  T4, FREE  CBG MONITORING, ED   ____________________________________________  EKG  EKG read and interpreted by me shows normal sinus rhythm rate of 95 rightward axis nonspecific ST-T wave changes   EKG #2 shows normal sinus rhythm rate of 88 with a very poor baseline but shows a normal axis nonspecific ST-T wave changes ____________________________________________  RADIOLOGY  ED MD interpretation:chest x-ray reviewed by me only shows some left basilar atelectasis or scarring as radiology  service.  MRI reviewed by me radiologist reads it as no acute disease and a mild urinary 1 malformation  Official radiology report(s): Mr Brain Wo Contrast  Result Date: 07/12/2017 CLINICAL DATA:  Initial evaluation for generalized weakness, difficulty with ambulation. EXAM: MRI HEAD WITHOUT CONTRAST TECHNIQUE: Multiplanar, multiecho pulse sequences of the brain and surrounding structures were obtained without intravenous contrast. COMPARISON:  None. FINDINGS: Brain: Cerebral volume is normal for patient age. No significant cerebral white matter changes identified. No abnormal foci of restricted diffusion to suggest acute or subacute ischemia. Gray-white matter differentiation maintained. No encephalomalacia to suggest chronic infarction. No foci of susceptibility artifact to suggest acute or chronic intracranial hemorrhage. No mass lesion, midline shift or mass effect. No hydrocephalus. No extra-axial fluid collection. Major dural sinuses are grossly patent. Pituitary gland suprasellar region normal. Midline structures intact and normal. Vascular: Major intracranial vascular flow voids are maintained. Skull and upper cervical spine: Mild Chiari malformation with the cerebellar tonsils protruding 6 mm through the foramen magnum noted. Upper cervical spine within normal limits. Bone marrow signal intensity normal. No scalp soft tissue abnormality. Sinuses/Orbits: Globes oral soft tissues within normal limits. Mild scattered mucosal thickening within the ethmoid air cells. Paranasal sinuses are otherwise clear. No mastoid effusion. Inner ear structures normal. Other: None. IMPRESSION: 1. No acute intracranial abnormality. 2. Mild Chiari 1 malformation. 3. Otherwise normal brain MRI. Electronically Signed   By: Jeannine Boga M.D.   On: 07/12/2017 00:07   Dg Chest Portable 1 View  Result Date: 07/11/2017 CLINICAL DATA:  Weakness. EXAM: PORTABLE CHEST 1 VIEW COMPARISON:  02/14/2016. Report from frontal  and lateral views 09/26/2016 FINDINGS: The cardiomediastinal contours are unchanged. Ill-defined right heart border likely secondary to pectus excavatum which was described previously. This is unchanged. Subsegmental atelectasis or scarring at the left lung base. Pulmonary vasculature is normal. No consolidation, pleural effusion, or pneumothorax. No acute osseous abnormalities are seen. IMPRESSION: Subsegmental left basilar atelectasis or scarring. Otherwise no acute abnormality. Electronically Signed   By: Jeb Levering M.D.   On: 07/11/2017 22:16    ____________________________________________   PROCEDURES  Procedure(s) performed:   Procedures  Critical Care performed:   ____________________________________________   INITIAL IMPRESSION / ASSESSMENT AND PLAN / ED COURSE  is awaiting the MRI report. Patient's hypo-thyroid by TSH and hypocalcemic. Await MRI report see what the final disposition patient should be although she  cannot walk very well at all.  patient has multiple mild medical problems mild hyponatremia some acute kidney injury mild hypothyroidism and hypocalcemia. However she really cannot walk without assistance and not even well that way. This is a new finding. I will talk to the hospitalist about putting her in the hospital.        ____________________________________________   FINAL CLINICAL IMPRESSION(S) / ED DIAGNOSES  Final diagnoses:  Weakness  Hypothyroidism, unspecified type  Hypocalcemia  AKI (acute kidney injury) [facility])     ED Discharge Orders    None       Note:  This document was prepared using Dragon voice recognition software and may include unintentional dictation errors.    Nena Polio, MD 07/11/17 2352    Nena Polio, MD 07/12/17 [place]

## 2017-07-11 NOTE — ED Triage Notes (Addendum)
Pt states that this am she was unable to stand and walk without assistance - this has improved throughout the day but pt c/o generalized weakness - she states her legs feel heavy - pt states for the last 2 weeks she has been having "twitching episodes" and is unable to hold things without shaking/twitching

## 2017-07-11 NOTE — ED Notes (Signed)
FN: pt brought over from [facility] for further eval of AMS. Daughter suspects pt is taking unknown drugs.

## 2017-07-11 NOTE — ED Notes (Signed)
Patient transported to MRI 

## 2017-07-11 NOTE — ED Notes (Signed)
Informed RN that patient has been roomed and is ready for evaluation.  Patient in NAD at this time and call bell placed within reach.   

## 2017-07-12 ENCOUNTER — Other Ambulatory Visit: Payer: Self-pay

## 2017-07-12 DIAGNOSIS — Z79899 Other long term (current) drug therapy: Secondary | ICD-10-CM | POA: Diagnosis not present

## 2017-07-12 DIAGNOSIS — R531 Weakness: Secondary | ICD-10-CM | POA: Diagnosis present

## 2017-07-12 DIAGNOSIS — I10 Essential (primary) hypertension: Secondary | ICD-10-CM | POA: Diagnosis present

## 2017-07-12 DIAGNOSIS — R339 Retention of urine, unspecified: Secondary | ICD-10-CM | POA: Diagnosis present

## 2017-07-12 DIAGNOSIS — Z880 Allergy status to penicillin: Secondary | ICD-10-CM | POA: Diagnosis not present

## 2017-07-12 DIAGNOSIS — Z23 Encounter for immunization: Secondary | ICD-10-CM | POA: Diagnosis not present

## 2017-07-12 DIAGNOSIS — E538 Deficiency of other specified B group vitamins: Secondary | ICD-10-CM | POA: Diagnosis present

## 2017-07-12 DIAGNOSIS — E785 Hyperlipidemia, unspecified: Secondary | ICD-10-CM | POA: Diagnosis present

## 2017-07-12 DIAGNOSIS — N179 Acute kidney failure, unspecified: Secondary | ICD-10-CM

## 2017-07-12 DIAGNOSIS — Z87891 Personal history of nicotine dependence: Secondary | ICD-10-CM | POA: Diagnosis not present

## 2017-07-12 DIAGNOSIS — Z91048 Other nonmedicinal substance allergy status: Secondary | ICD-10-CM | POA: Diagnosis not present

## 2017-07-12 DIAGNOSIS — G935 Compression of brain: Secondary | ICD-10-CM | POA: Diagnosis present

## 2017-07-12 DIAGNOSIS — Z886 Allergy status to analgesic agent status: Secondary | ICD-10-CM | POA: Diagnosis not present

## 2017-07-12 DIAGNOSIS — Z79890 Hormone replacement therapy (postmenopausal): Secondary | ICD-10-CM | POA: Diagnosis not present

## 2017-07-12 DIAGNOSIS — Z7951 Long term (current) use of inhaled steroids: Secondary | ICD-10-CM | POA: Diagnosis not present

## 2017-07-12 DIAGNOSIS — J45909 Unspecified asthma, uncomplicated: Secondary | ICD-10-CM | POA: Diagnosis present

## 2017-07-12 DIAGNOSIS — Q8500 Neurofibromatosis, unspecified: Secondary | ICD-10-CM | POA: Diagnosis not present

## 2017-07-12 HISTORY — DX: Acute kidney failure, unspecified: N17.9

## 2017-07-12 LAB — URINE DRUG SCREEN, QUALITATIVE (ARMC ONLY)
Amphetamines, Ur Screen: NOT DETECTED
Barbiturates, Ur Screen: NOT DETECTED
Benzodiazepine, Ur Scrn: POSITIVE — AB
Cannabinoid 50 Ng, Ur ~~LOC~~: NOT DETECTED
Cocaine Metabolite,Ur ~~LOC~~: NOT DETECTED
MDMA (Ecstasy)Ur Screen: NOT DETECTED
Methadone Scn, Ur: NOT DETECTED
Opiate, Ur Screen: NOT DETECTED
Phencyclidine (PCP) Ur S: POSITIVE — AB
Tricyclic, Ur Screen: POSITIVE — AB

## 2017-07-12 LAB — URINALYSIS, COMPLETE (UACMP) WITH MICROSCOPIC
Bacteria, UA: NONE SEEN
Bilirubin Urine: NEGATIVE
Glucose, UA: NEGATIVE mg/dL
Hgb urine dipstick: NEGATIVE
Ketones, ur: NEGATIVE mg/dL
Leukocytes, UA: NEGATIVE
Nitrite: NEGATIVE
Protein, ur: NEGATIVE mg/dL
Specific Gravity, Urine: 1.021 (ref 1.005–1.030)
pH: 5 (ref 5.0–8.0)

## 2017-07-12 LAB — PREGNANCY, URINE: Preg Test, Ur: NEGATIVE

## 2017-07-12 LAB — CK: Total CK: 97 U/L (ref 38–234)

## 2017-07-12 LAB — SEDIMENTATION RATE: Sed Rate: 3 mm/hr (ref 0–30)

## 2017-07-12 LAB — T4, FREE: Free T4: 0.87 ng/dL (ref 0.61–1.12)

## 2017-07-12 LAB — VITAMIN B12: Vitamin B-12: 234 pg/mL (ref 180–914)

## 2017-07-12 MED ORDER — PNEUMOCOCCAL VAC POLYVALENT 25 MCG/0.5ML IJ INJ
0.5000 mL | INJECTION | INTRAMUSCULAR | Status: AC
  Administered 2017-07-13: 0.5 mL via INTRAMUSCULAR
  Filled 2017-07-12: qty 0.5

## 2017-07-12 MED ORDER — PRAZOSIN HCL 1 MG PO CAPS
2.0000 mg | ORAL_CAPSULE | Freq: Every day | ORAL | Status: DC
  Administered 2017-07-12 – 2017-07-16 (×5): 2 mg via ORAL
  Filled 2017-07-12 (×5): qty 2

## 2017-07-12 MED ORDER — LEVOTHYROXINE SODIUM 50 MCG PO TABS
125.0000 ug | ORAL_TABLET | Freq: Every day | ORAL | Status: DC
  Administered 2017-07-12: 09:00:00 125 ug via ORAL
  Filled 2017-07-12: qty 3

## 2017-07-12 MED ORDER — QUETIAPINE FUMARATE 25 MG PO TABS
50.0000 mg | ORAL_TABLET | Freq: Three times a day (TID) | ORAL | Status: DC
  Administered 2017-07-12 – 2017-07-15 (×8): 50 mg via ORAL
  Filled 2017-07-12 (×8): qty 2

## 2017-07-12 MED ORDER — SODIUM CHLORIDE 0.9 % IV SOLN
INTRAVENOUS | Status: DC
  Administered 2017-07-12: 05:00:00 via INTRAVENOUS

## 2017-07-12 MED ORDER — ATENOLOL 25 MG PO TABS
25.0000 mg | ORAL_TABLET | Freq: Every day | ORAL | Status: DC
  Administered 2017-07-12 – 2017-07-15 (×4): 25 mg via ORAL
  Filled 2017-07-12 (×5): qty 1

## 2017-07-12 MED ORDER — DOCUSATE SODIUM 100 MG PO CAPS
100.0000 mg | ORAL_CAPSULE | Freq: Two times a day (BID) | ORAL | Status: DC
  Administered 2017-07-12 – 2017-07-16 (×9): 100 mg via ORAL
  Filled 2017-07-12 (×9): qty 1

## 2017-07-12 MED ORDER — ACETAMINOPHEN 325 MG PO TABS
650.0000 mg | ORAL_TABLET | Freq: Four times a day (QID) | ORAL | Status: DC | PRN
  Administered 2017-07-13: 17:00:00 650 mg via ORAL
  Filled 2017-07-12: qty 2

## 2017-07-12 MED ORDER — HEPARIN SODIUM (PORCINE) 5000 UNIT/ML IJ SOLN
5000.0000 [IU] | Freq: Three times a day (TID) | INTRAMUSCULAR | Status: DC
  Administered 2017-07-12 – 2017-07-13 (×4): 5000 [IU] via SUBCUTANEOUS
  Filled 2017-07-12 (×4): qty 1

## 2017-07-12 MED ORDER — LORAZEPAM 2 MG/ML IJ SOLN
1.0000 mg | Freq: Four times a day (QID) | INTRAMUSCULAR | Status: DC | PRN
  Administered 2017-07-12: 1 mg via INTRAVENOUS
  Filled 2017-07-12: qty 1

## 2017-07-12 MED ORDER — ACETAMINOPHEN 650 MG RE SUPP: 650.0000 mg | Freq: Four times a day (QID) | RECTAL | Status: DC | PRN

## 2017-07-12 MED ORDER — LEVOTHYROXINE SODIUM 150 MCG PO TABS
150.0000 ug | ORAL_TABLET | Freq: Every day | ORAL | Status: DC
  Administered 2017-07-13 – 2017-07-16 (×4): 150 ug via ORAL
  Filled 2017-07-12 (×2): qty 1
  Filled 2017-07-12 (×2): qty 3
  Filled 2017-07-12: qty 1
  Filled 2017-07-12: qty 3
  Filled 2017-07-12: qty 1

## 2017-07-12 MED ORDER — SODIUM CHLORIDE 0.9 % IV BOLUS (SEPSIS)
1000.0000 mL | Freq: Once | INTRAVENOUS | Status: AC
  Administered 2017-07-12: 1000 mL via INTRAVENOUS

## 2017-07-12 MED ORDER — ONDANSETRON HCL 4 MG PO TABS: 4.0000 mg | ORAL_TABLET | Freq: Four times a day (QID) | ORAL | Status: DC | PRN

## 2017-07-12 MED ORDER — MOMETASONE FURO-FORMOTEROL FUM 100-5 MCG/ACT IN AERO
2.0000 | INHALATION_SPRAY | Freq: Two times a day (BID) | RESPIRATORY_TRACT | Status: DC
  Administered 2017-07-12 – 2017-07-16 (×9): 2 via RESPIRATORY_TRACT
  Filled 2017-07-12: qty 8.8

## 2017-07-12 MED ORDER — LORAZEPAM 2 MG/ML IJ SOLN
1.0000 mg | Freq: Once | INTRAMUSCULAR | Status: AC
  Administered 2017-07-13: 1 mg via INTRAVENOUS
  Filled 2017-07-12: qty 1

## 2017-07-12 MED ORDER — ALBUTEROL SULFATE (2.5 MG/3ML) 0.083% IN NEBU: 2.5000 mg | INHALATION_SOLUTION | RESPIRATORY_TRACT | Status: DC | PRN

## 2017-07-12 MED ORDER — ONDANSETRON HCL 4 MG/2ML IJ SOLN: 4.0000 mg | Freq: Four times a day (QID) | INTRAMUSCULAR | Status: DC | PRN

## 2017-07-12 NOTE — Evaluation (Signed)
Physical Therapy Evaluation Patient Details Name: Jenna Tyler MRN: [id number] DOB: [birth year] Today's Date: 07/12/2017   History of Present Illness  Pt is a 52 yo female with past medical history of neurofibromatosis type I, hypothyroidism and hypertension presents to the emergency department with weakness and somnolence. The patient's daughter reported that her mom had some change in mental status that varied between somnolence and confusion. The patient admits to not feeling herself over the last 2 weeks and specifically endorses increasing weakness for the last 2 days. She states that she was unable to walk. Prior to admission the patient was ambulatory and does not have a history of neurologic deficits. Thus the new onset of her symptoms prompted the emergency department staff to call the hospitalist service for admission.  Assessment includes: AKI, weakness with urine toxicology showing benzodiazepines as well as PCP, the latter may be detected with chronic use of antihistamines, HTN, and hypothyroidism.    Clinical Impression  Pt presents with deficits in strength, transfers, mobility, gait, balance, and activity tolerance.  Pt required extra time and effort and use of the bed rail for bed mobility tasks but no physical assistance.  Pt required min A for stability during transfers and presented with poor eccentric control during stand to sit.  Pt's BUEs and BLEs were mildy tremulous in standing with pt only able to take 2-3 very small steps at the EOB with a RW before requiring to return to sitting.  Pt's SpO2 was 93% on room air throughout session with her HR increasing from a baseline of 96 bpm to 102 bpm after amb.  Pt will benefit from PT services in a SNF setting upon discharge to safely address above deficits for decreased caregiver assistance and eventual return to PLOF.      Follow Up Recommendations SNF    Equipment Recommendations  Rolling walker with 5" wheels;Other  (comment)(TBD at next venue of care upon discharge to SNF)    Recommendations for Other Services       Precautions / Restrictions Precautions Precautions: Fall Restrictions Weight Bearing Restrictions: No      Mobility  Bed Mobility Overal bed mobility: Modified Independent             General bed mobility comments: Extra time and effort required with bed mobility tasks along with use of bed rail but no physical assistance needed  Transfers Overall transfer level: Needs assistance Equipment used: Rolling walker (2 wheeled) Transfers: Sit to/from Stand Sit to Stand: Min assist;From elevated surface         General transfer comment: Min A for stability with transfers along with mod verbal cues for sequencing; poor eccentric control during stand to sit  Ambulation/Gait Ambulation/Gait assistance: Min assist Ambulation Distance (Feet): 2 Feet Assistive device: Rolling walker (2 wheeled) Gait Pattern/deviations: Step-to pattern   Gait velocity interpretation: <1.8 ft/sec, indicative of risk for recurrent falls General Gait Details: BUEs and BLEs mildy tremulous in standing with pt only able to take 2-3 very small steps at EOB before requiring to return to sitting.  Stairs            Wheelchair Mobility    Modified Rankin (Stroke Patients Only)       Balance Overall balance assessment: Needs assistance   Sitting balance-Leahy Scale: Normal     Standing balance support: Bilateral upper extremity supported Standing balance-Leahy Scale: Poor  Pertinent Vitals/Pain Pain Assessment: No/denies pain    Home Living Family/patient expects to be discharged to:: Private residence Living Arrangements: Parent(Lives with mother who is able-bodied) Available Help at Discharge: Family;Available 24 hours/day(Limited physical assist from elderly mother) Type of Home: House Home Access: Stairs to enter Entrance Stairs-Rails:  Right;Left(Can not reach both) Entrance Stairs-Number of Steps: 4 Home Layout: One level Home Equipment: Cane - single point      Prior Function Level of Independence: Independent         Comments: Ind amb community distances, Ind with ADLs, no fall history     Hand Dominance   Dominant Hand: Right    Extremity/Trunk Assessment   Upper Extremity Assessment Upper Extremity Assessment: Defer to OT evaluation    Lower Extremity Assessment Lower Extremity Assessment: Generalized weakness;LLE deficits/detail;RLE deficits/detail RLE Deficits / Details: Hip flex 3/5, knee flex and ext 4-/5, ankle DF 4/5  LLE Deficits / Details: Hip flex 3/5, knee flex and ext 4-/5, ankle DF 4/5        Communication   Communication: No difficulties  Cognition Arousal/Alertness: Awake/alert Behavior During Therapy: WFL for tasks assessed/performed Overall Cognitive Status: Within Functional Limits for tasks assessed                                        General Comments      Exercises Total Joint Exercises Ankle Circles/Pumps: AROM;Both;10 reps Quad Sets: Strengthening;Both;10 reps Gluteal Sets: Strengthening;Both;10 reps Hip ABduction/ADduction: AROM;Both;10 reps Straight Leg Raises: AROM;Both;10 reps Long Arc Quad: AROM;Both;10 reps Knee Flexion: AROM;Both;10 reps Marching in Standing: AROM;Both;5 reps;Seated;Standing Other Exercises Other Exercises: HEP education and review for BLE APs, QS, GS, and LAQs x 10 each 5-6x/day and BLE SLR x 10 2x/day   Assessment/Plan    PT Assessment Patient needs continued PT services  PT Problem List Decreased strength;Decreased balance;Decreased mobility;Decreased knowledge of use of DME;Decreased activity tolerance       PT Treatment Interventions DME instruction;Gait training;Stair training;Balance training;Therapeutic exercise;Therapeutic activities;Patient/family education    PT Goals (Current goals can be found in the  Care Plan section)  Acute Rehab PT Goals Patient Stated Goal: To be able to walk to the bathroom and back by myself PT Goal Formulation: With patient Time For Goal Achievement: 07/25/17 Potential to Achieve Goals: Good    Frequency Min 2X/week   Barriers to discharge Inaccessible home environment;Decreased caregiver support      Co-evaluation               AM-PAC PT "6 Clicks" Daily Activity  Outcome Measure Difficulty turning over in bed (including adjusting bedclothes, sheets and blankets)?: A Little Difficulty moving from lying on back to sitting on the side of the bed? : A Little Difficulty sitting down on and standing up from a chair with arms (e.g., wheelchair, bedside commode, etc,.)?: Unable Help needed moving to and from a bed to chair (including a wheelchair)?: A Little Help needed walking in hospital room?: A Lot Help needed climbing 3-5 steps with a railing? : Total 6 Click Score: 13    End of Session Equipment Utilized During Treatment: Gait belt Activity Tolerance: Patient tolerated treatment well Patient left: in bed;with bed alarm set;with call bell/phone within reach Nurse Communication: Mobility status PT Visit Diagnosis: Muscle weakness (generalized) (M62.81);Difficulty in walking, not elsewhere classified (R26.2)    Time: 2725-3664 PT Time Calculation (min) (ACUTE ONLY): 25 min  Charges:   PT Evaluation $PT Eval Low Complexity: 1 Low PT Treatments $Therapeutic Exercise: 8-22 mins   PT G Codes:        D. Royetta Asal PT, DPT 07/12/17, 11:45 AM

## 2017-07-12 NOTE — H&P (Signed)
Jenna Tyler is an 51 y.o. female.   Chief Complaint: Weakness HPI: Patient with past medical history of neurofibromatosis type I, hypothyroidism and hypertension presents to the emergency department with weakness and somnolence.  The patient's daughter reported that her mom had some change in mental status that varied between somnolence and confusion.  The patient admits to not feeling herself over the last 2 weeks and specifically endorses increasing weakness for the last 2 days.  She states that she is unable to walk.  Prior to admission the patient was ambulatory does not have a history of neurologic deficits.  Thus the new onset of her symptoms prompted the emergency department staff to call the hospitalist service for admission.  Past Medical History:  Diagnosis Date  . Asthma   . Hyperlipidemia   . Hypertension   . Hypothyroidism   . 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 Pancreatitis     Past Surgical History:  Procedure Laterality Date  . ABDOMINAL HYSTERECTOMY    . BREAST BIOPSY Right 2012   negative  . CHOLECYSTECTOMY    . 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 OTHER SURGICAL HISTORY     excision of lipoma  . OTHER SURGICAL HISTORY     Abdominal surgery  . THYROID SURGERY     thyroidectomy    Family History  Problem Relation Age of Onset  . Breast cancer Sister 80  . Breast cancer Maternal Aunt 43  . Breast cancer Maternal Grandmother 60  . Breast cancer Maternal Aunt 68   Social History:  reports that she quit smoking about 19 years ago. She has never used smokeless tobacco. She reports that she drank alcohol. She reports that she does not use drugs.  Allergies:  Allergies  Allergen Reactions  . Nsaids Rash    Internal bleeding  Intestinal bleeding  . Adhesive [Tape] Rash  . Aspirin Rash  . Penicillins Rash    Medications Prior to Admission  Medication Sig Dispense Refill  . hydrOXYzine (VISTARIL) 50 MG capsule Take 50 mg by mouth  every 6 (six) hours as needed.    Marland Kitchen levothyroxine (SYNTHROID, LEVOTHROID) 125 MCG tablet Take 125 mcg by mouth daily.   0  . pravastatin (PRAVACHOL) 20 MG tablet Take 20 mg by mouth daily.    . QUEtiapine (SEROQUEL) 300 MG tablet Take 450 mg by mouth at bedtime.    Marland Kitchen QUEtiapine (SEROQUEL) 50 MG tablet Take 50 mg by mouth 3 (three) times daily.    Marland Kitchen tiZANidine (ZANAFLEX) 4 MG tablet Take 12 mg by mouth at bedtime.    Marland Kitchen venlafaxine XR (EFFEXOR-XR) 150 MG 24 hr capsule Take 1 capsule (150 mg total) by mouth daily with breakfast. 30 capsule 1  . albuterol (VENTOLIN HFA) 108 (90 Base) MCG/ACT inhaler Inhale into the lungs.    Marland Kitchen amitriptyline (ELAVIL) 10 MG tablet Take 2 tablets by mouth daily.  0  . atenolol (TENORMIN) 25 MG tablet Take 25 mg by mouth at bedtime.   3  . busPIRone (BUSPAR) 5 MG tablet Take 1 tablet (5 mg total) by mouth 3 (three) times daily. 90 tablet 1  . mometasone-formoterol (DULERA) 100-5 MCG/ACT AERO Inhale 2 puffs into the lungs 2 (two) times daily. 1 Inhaler 1  . polyethylene glycol (MIRALAX / GLYCOLAX) packet Take 17 g by mouth daily. 14 each 0  . prazosin (MINIPRESS) 2 MG capsule Take 1 capsule (2 mg total) by mouth daily with breakfast. 30 capsule 1  . QUEtiapine (  SEROQUEL) 200 MG tablet Take 1 tablet (200 mg total) by mouth at bedtime. (Patient not taking: Reported on 07/12/2017) 30 tablet 1  . QUEtiapine (SEROQUEL) 25 MG tablet Take 1 tablet (25 mg total) by mouth 3 (three) times daily. (Patient not taking: Reported on 07/12/2017) 90 tablet 1  . ranitidine (ZANTAC) 150 MG tablet Take 150 mg by mouth 2 (two) times daily.    . SYMBICORT 160-4.5 MCG/ACT inhaler Inhale 2 puffs into the lungs 2 (two) times daily.   4  . Vitamin D, Ergocalciferol, (DRISDOL) 50000 units CAPS capsule Take 1 capsule by mouth once a week.  0    Results for orders placed or performed during the hospital encounter of 07/11/17 (from the past 48 hour(s))  Urinalysis, Complete w Microscopic     Status:  Abnormal   Collection Time: 07/11/17 12:32 AM  Result Value Ref Range   Color, Urine YELLOW (A) YELLOW   APPearance HAZY (A) CLEAR   Specific Gravity, Urine 1.021 1.005 - 1.030   pH 5.0 5.0 - 8.0   Glucose, UA NEGATIVE NEGATIVE mg/dL   Hgb urine dipstick NEGATIVE NEGATIVE   Bilirubin Urine NEGATIVE NEGATIVE   Ketones, ur NEGATIVE NEGATIVE mg/dL   Protein, ur NEGATIVE NEGATIVE mg/dL   Nitrite NEGATIVE NEGATIVE   Leukocytes, UA NEGATIVE NEGATIVE   RBC / HPF 0-5 0 - 5 RBC/hpf   WBC, UA 0-5 0 - 5 WBC/hpf   Bacteria, UA NONE SEEN NONE SEEN   Squamous Epithelial / LPF 0-5 (A) NONE SEEN   Mucus PRESENT     Comment: Performed at [facility], [address] [place], [place] [address]  Pregnancy, urine     Status: None   Collection Time: 07/11/17 12:32 AM  Result Value Ref Range   Preg Test, Ur NEGATIVE NEGATIVE    Comment: Performed at [facility], [address] [place], [place] [address]  Basic metabolic panel     Status: Abnormal   Collection Time: 07/11/17  5:11 PM  Result Value Ref Range   Sodium 132 (L) 135 - 145 mmol/L   Potassium 4.2 3.5 - 5.1 mmol/L   Chloride 99 (L) 101 - 111 mmol/L   CO2 20 (L) 22 - 32 mmol/L   Glucose, Bld 85 65 - 99 mg/dL   BUN 23 (H) 6 - 20 mg/dL   Creatinine, Ser 1.28 (H) 0.44 - 1.00 mg/dL   Calcium 8.6 (L) 8.9 - 10.3 mg/dL   GFR calc non Af Amer 48 (L) >60 mL/min   GFR calc Af Amer 55 (L) >60 mL/min    Comment: (NOTE) The eGFR has been calculated using the CKD EPI equation. This calculation has not been validated in all clinical situations. eGFR's persistently <60 mL/min signify possible Chronic Kidney Disease.    Anion gap 13 5 - 15    Comment: Performed at [facility], [address] [place],  [address]  CBC     Status: Abnormal   Collection Time: 07/11/17  5:11 PM  Result Value Ref Range   WBC 8.9 3.6 - 11.0 K/uL   RBC 5.61 (H) 3.80 - 5.20 MIL/uL   Hemoglobin 15.6 12.0 - 16.0 g/dL   HCT 45.4 35.0  - 47.0 %   MCV 80.9 80.0 - 100.0 fL   MCH 27.8 26.0 - 34.0 pg   MCHC 34.3 32.0 - 36.0 g/dL   RDW 14.1 11.5 - 14.5 %   Platelets 282 150 - 440 K/uL  Comment: Performed at [facility], [address] [place], [place] [address]  CK     Status: None   Collection Time: 07/11/17  5:11 PM  Result Value Ref Range   Total CK 199 38 - 234 U/L    Comment: Performed at [facility], [address] [place], [place] [address]  Hepatic function panel     Status: Abnormal   Collection Time: 07/11/17  5:11 PM  Result Value Ref Range   Total Protein 7.4 6.5 - 8.1 g/dL   Albumin 4.3 3.5 - 5.0 g/dL   AST 23 15 - 41 U/L   ALT 21 14 - 54 U/L   Alkaline Phosphatase 89 38 - 126 U/L   Total Bilirubin 0.5 0.3 - 1.2 mg/dL   Bilirubin, Direct <0.1 (L) 0.1 - 0.5 mg/dL   Indirect Bilirubin NOT CALCULATED 0.3 - 0.9 mg/dL    Comment: Performed at [facility], [address] [place], [place] [address]  Troponin I     Status: None   Collection Time: 07/11/17  5:11 PM  Result Value Ref Range   Troponin I <0.03 <0.03 ng/mL    Comment: Performed at [facility], [address] [place], [place] [address]  TSH     Status: Abnormal   Collection Time: 07/11/17  5:11 PM  Result Value Ref Range   TSH 10.204 (H) 0.350 - 4.500 uIU/mL    Comment: Performed by a 3rd Generation assay with a functional sensitivity of <=0.01 uIU/mL. Performed at [facility], [address] [place], [place] [address]   T4, free     Status: None   Collection Time: 07/11/17 11:49 PM  Result Value Ref Range   Free T4 0.87 0.61 - 1.12 ng/dL    Comment: (NOTE) Biotin ingestion may interfere with free T4 tests. If the results are inconsistent with the TSH level, previous test results, or the clinical presentation, then consider biotin interference. If needed, order repeat testing after stopping biotin. Performed at [facility], [address] [place], [place] [address]    Urine Drug Screen, Qualitative [facility] only)     Status: Abnormal   Collection Time: 07/12/17 12:32 AM  Result Value Ref Range   Tricyclic, Ur Screen POSITIVE (A) NONE DETECTED   Amphetamines, Ur Screen NONE DETECTED NONE DETECTED   MDMA (Ecstasy)Ur Screen NONE DETECTED NONE DETECTED   Cocaine Metabolite,Ur [place] NONE DETECTED NONE DETECTED   Opiate, Ur Screen NONE DETECTED NONE DETECTED   Phencyclidine (PCP) Ur S POSITIVE (A) NONE DETECTED   Cannabinoid 50 Ng, Ur  NONE DETECTED NONE DETECTED   Barbiturates, Ur Screen NONE DETECTED NONE DETECTED   Benzodiazepine, Ur Scrn POSITIVE (A) NONE DETECTED   Methadone Scn, Ur NONE DETECTED NONE DETECTED    Comment: (NOTE) Tricyclics + metabolites, urine    Cutoff 1000 ng/mL Amphetamines + metabolites, urine  Cutoff 1000 ng/mL MDMA (Ecstasy), urine              Cutoff 500 ng/mL Cocaine Metabolite, urine          Cutoff 300 ng/mL Opiate + metabolites, urine        Cutoff 300 ng/mL Phencyclidine (PCP), urine         Cutoff 25 ng/mL Cannabinoid, urine                 Cutoff 50 ng/mL Barbiturates + metabolites, urine  Cutoff 200 ng/mL Benzodiazepine, urine  Cutoff 200 ng/mL Methadone, urine                   Cutoff 300 ng/mL The urine drug screen provides only a preliminary, unconfirmed analytical test result and should not be used for non-medical purposes. Clinical consideration and professional judgment should be applied to any positive drug screen result due to possible interfering substances. A more specific alternate chemical method must be used in order to obtain a confirmed analytical result. Gas chromatography / mass spectrometry (GC/MS) is the preferred confirmat ory method. Performed at [facility], [address] [place], [place] [address]    Mr Brain 72 Contrast  Result Date: 07/12/2017 CLINICAL DATA:  Initial evaluation for generalized weakness, difficulty with ambulation. EXAM: MRI HEAD WITHOUT CONTRAST  TECHNIQUE: Multiplanar, multiecho pulse sequences of the brain and surrounding structures were obtained without intravenous contrast. COMPARISON:  None. FINDINGS: Brain: Cerebral volume is normal for patient age. No significant cerebral white matter changes identified. No abnormal foci of restricted diffusion to suggest acute or subacute ischemia. Gray-white matter differentiation maintained. No encephalomalacia to suggest chronic infarction. No foci of susceptibility artifact to suggest acute or chronic intracranial hemorrhage. No mass lesion, midline shift or mass effect. No hydrocephalus. No extra-axial fluid collection. Major dural sinuses are grossly patent. Pituitary gland suprasellar region normal. Midline structures intact and normal. Vascular: Major intracranial vascular flow voids are maintained. Skull and upper cervical spine: Mild Chiari malformation with the cerebellar tonsils protruding 6 mm through the foramen magnum noted. Upper cervical spine within normal limits. Bone marrow signal intensity normal. No scalp soft tissue abnormality. Sinuses/Orbits: Globes oral soft tissues within normal limits. Mild scattered mucosal thickening within the ethmoid air cells. Paranasal sinuses are otherwise clear. No mastoid effusion. Inner ear structures normal. Other: None. IMPRESSION: 1. No acute intracranial abnormality. 2. Mild Chiari 1 malformation. 3. Otherwise normal brain MRI. Electronically Signed   By: Jeannine Boga M.D.   On: 07/12/2017 00:07   Dg Chest Portable 1 View  Result Date: 07/11/2017 CLINICAL DATA:  Weakness. EXAM: PORTABLE CHEST 1 VIEW COMPARISON:  02/14/2016. Report from frontal and lateral views 09/26/2016 FINDINGS: The cardiomediastinal contours are unchanged. Ill-defined right heart border likely secondary to pectus excavatum which was described previously. This is unchanged. Subsegmental atelectasis or scarring at the left lung base. Pulmonary vasculature is normal. No  consolidation, pleural effusion, or pneumothorax. No acute osseous abnormalities are seen. IMPRESSION: Subsegmental left basilar atelectasis or scarring. Otherwise no acute abnormality. Electronically Signed   By: Jeb Levering M.D.   On: 07/11/2017 22:16    Review of Systems  Constitutional: Negative for chills and fever.  HENT: Negative for sore throat and tinnitus.   Eyes: Negative for blurred vision and redness.  Respiratory: Negative for cough and shortness of breath.   Cardiovascular: Negative for chest pain, palpitations, orthopnea and PND.  Gastrointestinal: Negative for abdominal pain, diarrhea, nausea and vomiting.  Genitourinary: Negative for dysuria, frequency and urgency.  Musculoskeletal: Negative for joint pain and myalgias.  Skin: Negative for rash.       No lesions  Neurological: Positive for weakness. Negative for speech change and focal weakness.  Endo/Heme/Allergies: Does not bruise/bleed easily.       No temperature intolerance  Psychiatric/Behavioral: Negative for depression and suicidal ideas.    Blood pressure 128/73, pulse 94, temperature (!) 97.5 F (36.4 C), temperature source Oral, resp. rate 18, height '5\' 5"'$  (1.651 m), weight 86 kg (189 lb 9.6 oz), SpO2 99 %.  Physical Exam  Vitals reviewed. Constitutional: She is oriented to person, place, and time. She appears well-developed and well-nourished. No distress.  HENT:  Head: Normocephalic and atraumatic.  Mouth/Throat: Oropharynx is clear and moist.  Eyes: Pupils are equal, round, and reactive to light. Conjunctivae and EOM are normal. No scleral icterus.  Neck: Normal range of motion. Neck supple. No JVD present. No tracheal deviation present. No thyromegaly present.  Cardiovascular: Normal rate, regular rhythm and normal heart sounds. Exam reveals no gallop and no friction rub.  No murmur heard. Respiratory: Effort normal and breath sounds normal.  GI: Soft. Bowel sounds are normal. She exhibits no  distension. There is no tenderness.  Genitourinary:  Genitourinary Comments: Deferred  Musculoskeletal: Normal range of motion. She exhibits no edema.  Lymphadenopathy:    She has no cervical adenopathy.  Neurological: She is alert and oriented to person, place, and time. No cranial nerve deficit. She exhibits normal muscle tone.  Skin: Skin is warm and dry. No rash noted. No erythema.  Psychiatric: She has a normal mood and affect. Her behavior is normal. Judgment and thought content normal.     Assessment/Plan This is a 52 year old female admitted for acute kidney injury.  1.  AKI: Mild bump in creatinine; likely prerenal.  It does not exactly explain the patient's inability to walk at this time.  Hydrate with intravenous fluid.  Avoid nephrotoxic agents. 2.  Weakness: PT/OT to eval.  Etiology is unclear although the patient's daughter reported to the emergency department staff that she was concerned her mom may be taking medicines that have not been prescribed to her.  Urine toxicology shows benzodiazepines as well as PCP.  The latter may be detected with chronic use of antihistamines.  Thus I have held the patient's hydroxyzine at this time.  I have also held Seroquel as well as amitriptyline and BuSpar which may have an additive effect on her weakness. 3.  Hypertension: Controlled; continue atenolol and prazosin 4.  Hypothyroidism: Check TSH; continue Synthroid next line 5.  DVT prophylaxis: Heparin 6.  GI prophylaxis: None The patient is a full code.  Time spent on admission orders and patient care approximately 45 minutes  Harrie Foreman, MD 07/12/2017, 8:17 AM

## 2017-07-12 NOTE — Progress Notes (Signed)
Occupational Therapy Evaluation Patient Details Name: Jenna Tyler MRN: [id number] DOB: 1965-05-23 Today's Date: 07/12/2017    History of Present Illness Pt. is a 52 y.o. female who was admitted to [facility]  with AMS, somnolence, AKI with urine toxicology showing benzodiazepines, and PCP, the latter detected with chronic use of antihistimines. Pt. PMHx includes: HTN, Hypothyroidism, neurofibromatosis type1.   Clinical Impression   Pt. Presents with weakness, limited activity tolerance, and limited functional mobility which limit her ability to complete basic ADL and IADL functioning in standing. Pt. Resides at home with her mother.  Pt. Was independent with ADLs, and IADL functioning: including meal preparation, and medication management. Pt. Was able to drive. Pt. Does not work. Pt. Has supportive family.  Pt. Education was provided about safety with ADL tasks, secondary to weakness with standing, and decreased activity tolerance. Pt. Could benefit from OT services for ADL training, A/E training, UE there. Ex., and pt. education about energy conservation, and work simplification techniques, home modification, and DME. Pt. Could benefit from SNF level of care with follow-up OT services upon discharge.    Follow Up Recommendations  SNF    Equipment Recommendations       Recommendations for Other Services       Precautions / Restrictions Precautions Precautions: Fall Restrictions Weight Bearing Restrictions: No      Mobility Bed Mobility Overal bed mobility: Modified Independent             General bed mobility comments: Extra time and effort required with bed mobility tasks along with use of bed rail but no physical assistance needed  Transfers Overall transfer level: Needs assistance Equipment used: Rolling walker (2 wheeled) Transfers: Sit to/from Stand Sit to Stand: Min assist;From elevated surface         General transfer comment: Min A for stability with  transfers along with mod verbal cues for sequencing; poor eccentric control during stand to sit    Balance Overall balance assessment: Needs assistance   Sitting balance-Leahy Scale: Normal     Standing balance support: Bilateral upper extremity supported Standing balance-Leahy Scale: Poor                             ADL either performed or assessed with clinical judgement   ADL Overall ADL's : Needs assistance/impaired Eating/Feeding: Set up;Independent;Sitting   Grooming: Set up;Sitting;Independent   Upper Body Bathing: Set up;Independent;Sitting   Lower Body Bathing: Set up;Minimal assistance   Upper Body Dressing : Set up;Independent   Lower Body Dressing: Set up;Moderate assistance;Independent Lower Body Dressing Details (indicate cue type and reason): Independent donning clothing over feet from sitting, increased assisted required for clothing negotiation in standing.             Functional mobility during ADLs: Moderate assistance General ADL Comments: Pt. education was provided about safety with ADL tasks given limitation with standing.     Vision Patient Visual Report: Blurring of vision Vision Assessment?: Vision impaired- to be further tested in functional context     Perception     Praxis      Pertinent Vitals/Pain Pain Assessment: No/denies pain     Hand Dominance Right   Extremity/Trunk Assessment Upper Extremity Assessment Upper Extremity Assessment: Generalized weakness   Lower Extremity Assessment Lower Extremity Assessment: Generalized weakness;LLE deficits/detail;RLE deficits/detail RLE Deficits / Details: Hip flex 3/5, knee flex and ext 4-/5, ankle DF 4/5  LLE Deficits / Details: Hip flex 3/5,  knee flex and ext 4-/5, ankle DF 4/5        Communication Communication Communication: No difficulties   Cognition Arousal/Alertness: Awake/alert Behavior During Therapy: WFL for tasks assessed/performed Overall Cognitive Status:  Within Functional Limits for tasks assessed                                     General Comments       Exercises Total Joint Exercises Ankle Circles/Pumps: AROM;Both;10 reps Quad Sets: Strengthening;Both;10 reps Gluteal Sets: Strengthening;Both;10 reps Hip ABduction/ADduction: AROM;Both;10 reps Straight Leg Raises: AROM;Both;10 reps Long Arc Quad: AROM;Both;10 reps Knee Flexion: AROM;Both;10 reps Marching in Standing: AROM;Both;5 reps;Seated;Standing Other Exercises Other Exercises: HEP education and review for BLE APs, QS, GS, and LAQs x 10 each 5-6x/day and BLE SLR x 10 2x/day   Shoulder Instructions      Home Living Family/patient expects to be discharged to:: Private residence Living Arrangements: Parent(Resides with her mother.) Available Help at Discharge: Family;Available 24 hours/day Type of Home: House Home Access: Stairs to enter [employer] of Steps: 4 Entrance Stairs-Rails: Right;Left Home Layout: One level     Bathroom Shower/Tub: Tub/shower unit         Home Equipment: Cane - single point          Prior Functioning/Environment Level of Independence: Independent        Comments: Ind amb community distances, Ind with ADLs, no fall history        OT Problem List: Decreased strength;Decreased activity tolerance;Decreased knowledge of use of DME or AE      OT Treatment/Interventions: Self-care/ADL training;Therapeutic exercise;Therapeutic activities;DME and/or AE instruction    OT Goals(Current goals can be found in the care plan section) Acute Rehab OT Goals Patient Stated Goal: To be able to walk to bathroom , and the sink  OT Goal Formulation: With patient Potential to Achieve Goals: Good  OT Frequency: Min 2X/week   Barriers to D/C:            Co-evaluation              AM-PAC PT "6 Clicks" Daily Activity     Outcome Measure Help from another person eating meals?: None Help from another person taking  care of personal grooming?: None Help from another person toileting, which includes using toliet, bedpan, or urinal?: A Lot Help from another person bathing (including washing, rinsing, drying)?: A Lot Help from another person to put on and taking off regular upper body clothing?: A Little Help from another person to put on and taking off regular lower body clothing?: A Lot 6 Click Score: 17   End of Session    Activity Tolerance: Patient tolerated treatment well Patient left: in bed  OT Visit Diagnosis: Unsteadiness on feet (R26.81);Muscle weakness (generalized) (M62.81)                Time: 9628-3662 OT Time Calculation (min): 25 min Charges:  OT General Charges $OT Visit: 1 Visit OT Evaluation $OT Eval Moderate Complexity: 1 Mod G-Codes:    Harrel Carina, MS, OTR/L  Harrel Carina, MS, OTR/L 07/12/2017, 12:27 PM

## 2017-07-12 NOTE — Progress Notes (Signed)
OT Cancellation Note  Patient Details Name: Jenna Tyler MRN: [id number] DOB: October 06, 1965   Cancelled Treatment:    Reason Eval/Treat Not Completed: Patient at procedure or test/ unavailable(Pt. with PT upon 1st attempt for an initial evaluation. )  Harrel Carina, MS OTR/L 07/12/2017, 10:56 AM

## 2017-07-12 NOTE — Plan of Care (Signed)
  Problem: Education: Goal: Knowledge of General Education information will improve Outcome: Progressing   Problem: Health Behavior/Discharge Planning: Goal: Ability to manage health-related needs will improve Outcome: Progressing   Problem: Clinical Measurements: Goal: Ability to maintain clinical measurements within normal limits will improve Outcome: Progressing Goal: Will remain free from infection Outcome: Progressing Goal: Diagnostic test results will improve Outcome: Progressing Goal: Respiratory complications will improve Outcome: Progressing Goal: Cardiovascular complication will be avoided Outcome: Progressing   Problem: Activity: Goal: Risk for activity intolerance will decrease Outcome: Progressing   Problem: Nutrition: Goal: Adequate nutrition will be maintained Outcome: Progressing   Problem: Coping: Goal: Level of anxiety will decrease Outcome: Progressing   Problem: Elimination: Goal: Will not experience complications related to bowel motility Outcome: Progressing Goal: Will not experience complications related to urinary retention Outcome: Progressing   Problem: Pain Managment: Goal: General experience of comfort will improve Outcome: Progressing   Problem: Safety: Goal: Ability to remain free from injury will improve Outcome: Progressing   Problem: Skin Integrity: Goal: Risk for impaired skin integrity will decrease Outcome: Progressing   Problem: Skin Integrity: Goal: Risk for impaired skin integrity will decrease Outcome: Progressing

## 2017-07-12 NOTE — Consult Note (Signed)
Reason for Consult:BLEweakness Referring Physician: Anselm Jungling  CC: BLE weakness  HPI: Jenna Tyler is an 52 y.o. female with a history of BPD who reports that for the past few months she has had some intermittent tingling in her legs.  On yesterday awakened and was unable to support her weight when attempting to get out of bed.  Patient reports today that there were no difficulties prior to yesterday morning but on review of the ED note it seems she reported that she had been feeling weak and tremulous for the past 2 weeks with slow worsening.  Urinary retention has been noted for the past 2 days.  The patient reports no sensory symptoms at this time and no upper extremity involvement.    Past Medical History:  Diagnosis Date  . Asthma   . Hyperlipidemia   . Hypertension   . Hypothyroidism   . 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 Pancreatitis     Past Surgical History:  Procedure Laterality Date  . ABDOMINAL HYSTERECTOMY    . BREAST BIOPSY Right 2012   negative  . CHOLECYSTECTOMY    . 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 OTHER SURGICAL HISTORY     excision of lipoma  . OTHER SURGICAL HISTORY     Abdominal surgery  . THYROID SURGERY     thyroidectomy    Family History  Problem Relation Age of Onset  . Breast cancer Sister 5  . Breast cancer Maternal Aunt 36  . Breast cancer Maternal Grandmother 60  . Breast cancer Maternal Aunt 76    Social History:  reports that she quit smoking about 19 years ago. She has never used smokeless tobacco. She reports that she drank alcohol. She reports that she does not use drugs.  Allergies  Allergen Reactions  . Nsaids Rash    Internal bleeding  Intestinal bleeding  . Adhesive [Tape] Rash  . Aspirin Rash  . Penicillins Rash    Medications:  I have reviewed the patient's current medications. Prior to Admission:  Medications Prior to Admission  Medication Sig Dispense Refill Last Dose  . albuterol (VENTOLIN  HFA) 108 (90 Base) MCG/ACT inhaler Inhale 1-2 puffs into the lungs every 6 (six) hours as needed.    PRN at PRN  . hydrOXYzine (VISTARIL) 50 MG capsule Take 50 mg by mouth every 6 (six) hours as needed.   PRN at PRN  . levothyroxine (SYNTHROID, LEVOTHROID) 125 MCG tablet Take 125 mcg by mouth daily.   0 07/11/2017 at AM  . mometasone-formoterol (DULERA) 100-5 MCG/ACT AERO Inhale 2 puffs into the lungs 2 (two) times daily. 1 Inhaler 1 07/11/2017 at PM  . polyethylene glycol (MIRALAX / GLYCOLAX) packet Take 17 g by mouth daily. 14 each 0 PRN at PRN  . pravastatin (PRAVACHOL) 20 MG tablet Take 20 mg by mouth daily.   07/11/2017 at PM  . prazosin (MINIPRESS) 2 MG capsule Take 1 capsule (2 mg total) by mouth daily with breakfast. 30 capsule 1 07/11/2017 at AM  . QUEtiapine (SEROQUEL) 300 MG tablet Take 450 mg by mouth at bedtime.   07/11/2017 at PM  . QUEtiapine (SEROQUEL) 50 MG tablet Take 50 mg by mouth 3 (three) times daily.   07/11/2017 at PM  . tiZANidine (ZANAFLEX) 4 MG tablet Take 12 mg by mouth at bedtime.   UTD at UTD  . venlafaxine XR (EFFEXOR-XR) 150 MG 24 hr capsule Take 1 capsule (150 mg total) by mouth daily with breakfast. 30 capsule  1 07/12/2017 at AM  . busPIRone (BUSPAR) 5 MG tablet Take 1 tablet (5 mg total) by mouth 3 (three) times daily. (Patient not taking: Reported on 07/12/2017) 90 tablet 1 Not Taking at --  . QUEtiapine (SEROQUEL) 200 MG tablet Take 1 tablet (200 mg total) by mouth at bedtime. (Patient not taking: Reported on 07/12/2017) 30 tablet 1 Not Taking at Unknown time  . QUEtiapine (SEROQUEL) 25 MG tablet Take 1 tablet (25 mg total) by mouth 3 (three) times daily. (Patient not taking: Reported on 07/12/2017) 90 tablet 1 Not Taking at Unknown time   Scheduled: . atenolol  25 mg Oral QHS  . docusate sodium  100 mg Oral BID  . heparin  5,000 Units Subcutaneous Q8H  . [START ON 07/13/2017] levothyroxine  150 mcg Oral QAC breakfast  . LORazepam  1 mg Intravenous Once  .  mometasone-formoterol  2 puff Inhalation BID  . [START ON 07/13/2017] pneumococcal 23 valent vaccine  0.5 mL Intramuscular Tomorrow-1000  . prazosin  2 mg Oral Q breakfast  . QUEtiapine  50 mg Oral TID    ROS: History obtained from chart review and the patient  General ROS: negative for - chills, fatigue, fever, night sweats, weight gain or weight loss Psychological ROS: confusion Ophthalmic ROS: negative for - blurry vision, double vision, eye pain or loss of vision ENT ROS: negative for - epistaxis, nasal discharge, oral lesions, sore throat, tinnitus or vertigo Allergy and Immunology ROS: negative for - hives or itchy/watery eyes Hematological and Lymphatic ROS: negative for - bleeding problems, bruising or swollen lymph nodes Endocrine ROS: negative for - galactorrhea, hair pattern changes, polydipsia/polyuria or temperature intolerance Respiratory ROS: negative for - cough, hemoptysis, shortness of breath or wheezing Cardiovascular ROS: negative for - chest pain, dyspnea on exertion, edema or irregular heartbeat Gastrointestinal ROS: negative for - abdominal pain, diarrhea, hematemesis, nausea/vomiting or stool incontinence Genito-Urinary ROS: as noted in HPI Musculoskeletal ROS: negative for - joint swelling or muscular weakness Neurological ROS: as noted in HPI Dermatological ROS: negative for rash and skin lesion changes  Physical Examination: Blood pressure 140/85, pulse 96, temperature 98.4 F (36.9 C), temperature source Oral, resp. rate 16, height _0  (1.651 m), weight 86 kg (189 lb 9.6 oz), SpO2 93 %.  HEENT-  Normocephalic, no lesions, without obvious abnormality.  Normal external eye and conjunctiva.  Normal TM's bilaterally.  Normal auditory canals and external ears. Normal external nose, mucus membranes and septum.  Normal pharynx. Cardiovascular- S1, S2 normal, pulses palpable throughout   Lungs- chest clear, no wheezing, rales, normal symmetric air entry Abdomen-  soft, non-tender; bowel sounds normal; no masses,  no organomegaly Extremities- no edema Lymph-no adenopathy palpable Musculoskeletal-no joint tenderness, deformity or swelling Skin-warm and dry, no hyperpigmentation, vitiligo, or suspicious lesions  Neurological Examination   Mental Status: Alert, oriented, thought content appropriate.  Speech fluent without evidence of aphasia.  Able to follow 3 step commands without difficulty. Cranial Nerves: II: Discs flat bilaterally; Visual fields grossly normal, pupils equal, round, reactive to light and accommodation III,IV, VI: mild left ptosis, extra-ocular motions intact bilaterally V,VII: decreased right NLF, facial light touch sensation normal bilaterally VIII: hearing normal bilaterally IX,X: gag reflex present XI: bilateral shoulder shrug XII: midline tongue extension Motor: 3+/5 bilateral hip flexor strength.  Otherwise 5/5 throughout.  Normal tone.   Sensory: Pinprick and light touch intact throughout, bilaterally Deep Tendon Reflexes: 3+ and symmetric throughout Plantars: Right: downgoing   Left: downgoing Cerebellar: Normal finger-to-nose and  normal heel-to-shin testing bilaterally Gait: not tested due to safety concerns    Laboratory Studies:   Basic Metabolic Panel: Recent Labs  Lab 07/11/17 1711  NA 132*  K 4.2  CL 99*  CO2 20*  GLUCOSE 85  BUN 23*  CREATININE 1.28*  CALCIUM 8.6*    Liver Function Tests: Recent Labs  Lab 07/11/17 1711  AST 23  ALT 21  ALKPHOS 89  BILITOT 0.5  PROT 7.4  ALBUMIN 4.3   No results for input(s): LIPASE, AMYLASE in the last 168 hours. No results for input(s): AMMONIA in the last 168 hours.  CBC: Recent Labs  Lab 07/11/17 1711  WBC 8.9  HGB 15.6  HCT 45.4  MCV 80.9  PLT 282    Cardiac Enzymes: Recent Labs  Lab 07/11/17 1711  CKTOTAL 199  TROPONINI <0.03    BNP: Invalid input(s): POCBNP  CBG: No results for input(s): GLUCAP in the last 168  hours.  Microbiology: No results found for this or any previous visit.  Coagulation Studies: No results for input(s): LABPROT, INR in the last 72 hours.  Urinalysis:  Recent Labs  Lab 07/11/17 0032  COLORURINE YELLOW*  LABSPEC 1.021  PHURINE 5.0  GLUCOSEU NEGATIVE  HGBUR NEGATIVE  BILIRUBINUR NEGATIVE  KETONESUR NEGATIVE  PROTEINUR NEGATIVE  NITRITE NEGATIVE  LEUKOCYTESUR NEGATIVE    Lipid Panel:     Component Value Date/Time   CHOL 283 (H) 12/29/2016 0728   TRIG 170 (H) 12/29/2016 0728   HDL 47 12/29/2016 0728   CHOLHDL 6.0 12/29/2016 0728   VLDL 34 12/29/2016 0728   LDLCALC 202 (H) 12/29/2016 0728    HgbA1C:  Lab Results  Component Value Date   HGBA1C 5.7 (H) 12/29/2016    Urine Drug Screen:      Component Value Date/Time   LABOPIA NONE DETECTED 07/12/2017 0032   COCAINSCRNUR NONE DETECTED 07/12/2017 0032   LABBENZ POSITIVE (A) 07/12/2017 0032   AMPHETMU NONE DETECTED 07/12/2017 0032   THCU NONE DETECTED 07/12/2017 0032   LABBARB NONE DETECTED 07/12/2017 0032    Alcohol Level: No results for input(s): ETH in the last 168 hours.  Imaging: Mr Brain Wo Contrast  Result Date: 07/12/2017 CLINICAL DATA:  Initial evaluation for generalized weakness, difficulty with ambulation. EXAM: MRI HEAD WITHOUT CONTRAST TECHNIQUE: Multiplanar, multiecho pulse sequences of the brain and surrounding structures were obtained without intravenous contrast. COMPARISON:  None. FINDINGS: Brain: Cerebral volume is normal for patient age. No significant cerebral white matter changes identified. No abnormal foci of restricted diffusion to suggest acute or subacute ischemia. Gray-white matter differentiation maintained. No encephalomalacia to suggest chronic infarction. No foci of susceptibility artifact to suggest acute or chronic intracranial hemorrhage. No mass lesion, midline shift or mass effect. No hydrocephalus. No extra-axial fluid collection. Major dural sinuses are grossly  patent. Pituitary gland suprasellar region normal. Midline structures intact and normal. Vascular: Major intracranial vascular flow voids are maintained. Skull and upper cervical spine: Mild Chiari malformation with the cerebellar tonsils protruding 6 mm through the foramen magnum noted. Upper cervical spine within normal limits. Bone marrow signal intensity normal. No scalp soft tissue abnormality. Sinuses/Orbits: Globes oral soft tissues within normal limits. Mild scattered mucosal thickening within the ethmoid air cells. Paranasal sinuses are otherwise clear. No mastoid effusion. Inner ear structures normal. Other: None. IMPRESSION: 1. No acute intracranial abnormality. 2. Mild Chiari 1 malformation. 3. Otherwise normal brain MRI. Electronically Signed   By: Jeannine Boga M.D.   On: 07/12/2017 00:07  Dg Chest Portable 1 View  Result Date: 07/11/2017 CLINICAL DATA:  Weakness. EXAM: PORTABLE CHEST 1 VIEW COMPARISON:  02/14/2016. Report from frontal and lateral views 09/26/2016 FINDINGS: The cardiomediastinal contours are unchanged. Ill-defined right heart border likely secondary to pectus excavatum which was described previously. This is unchanged. Subsegmental atelectasis or scarring at the left lung base. Pulmonary vasculature is normal. No consolidation, pleural effusion, or pneumothorax. No acute osseous abnormalities are seen. IMPRESSION: Subsegmental left basilar atelectasis or scarring. Otherwise no acute abnormality. Electronically Signed   By: Jeb Levering M.D.   On: 07/11/2017 22:16     Assessment/Plan: 52 year old female presenting with bilateral hip flexor weakness and urinary retention.  AKI noted with lab work.  TSH elevated but normal T4.  T3 pending.  MRI of the brain shows no acute changes.  Mild Chiari 1 noted.  Patient with a history of NF1.  At this point with preservation of reflexes, no evidence of GBS.    Recommendations: 1.  MRI of the thoracic and lumbar spines,  could have lesions related to NF1 2.  CK, ESR, CRP, B1, B12, acetylcholine receptor antibody 3.  Agree with discontinuation of Zanaflex  Alexis Goodell, MD Neurology [phone] 07/12/2017, 2:40 PM

## 2017-07-12 NOTE — Progress Notes (Signed)
[facility] [place] NAME: Jenna Tyler    MR#:  [id number]  DATE OF BIRTH:  Nov 01, 1965  SUBJECTIVE:  CHIEF COMPLAINT:   Chief Complaint  Patient presents with  . Weakness    Came with weakness and have both lower limbs weakness and bladder retention.  REVIEW OF SYSTEMS:  CONSTITUTIONAL: No fever, fatigue or weakness.  EYES: No blurred or double vision.  EARS, NOSE, AND THROAT: No tinnitus or ear pain.  RESPIRATORY: No cough, shortness of breath, wheezing or hemoptysis.  CARDIOVASCULAR: No chest pain, orthopnea, edema.  GASTROINTESTINAL: No nausea, vomiting, diarrhea or abdominal pain.  GENITOURINARY: No dysuria, hematuria.  ENDOCRINE: No polyuria, nocturia,  HEMATOLOGY: No anemia, easy bruising or bleeding SKIN: No rash or lesion. MUSCULOSKELETAL: No joint pain or arthritis.   NEUROLOGIC: No tingling, numbness, have both leg weakness.  PSYCHIATRY: No anxiety or depression.   ROS  DRUG ALLERGIES:   Allergies  Allergen Reactions  . Nsaids Rash    Internal bleeding  Intestinal bleeding  . Adhesive [Tape] Rash  . Aspirin Rash  . Penicillins Rash    VITALS:  Blood pressure (!) 144/81, pulse 87, temperature 98 F (36.7 C), temperature source Oral, resp. rate 18, height 5\' 5"  (1.651 m), weight 86 kg (189 lb 9.6 oz), SpO2 95 %.  PHYSICAL EXAMINATION:  GENERAL:  52 y.o.-year-old patient lying in the bed with no acute distress.  EYES: Pupils equal, round, reactive to light and accommodation. No scleral icterus. Extraocular muscles intact.  HEENT: Head atraumatic, normocephalic. Oropharynx and nasopharynx clear.  NECK:  Supple, no jugular venous distention. No thyroid enlargement, no tenderness.  LUNGS: Normal breath sounds bilaterally, no wheezing, rales,rhonchi or crepitation. No use of accessory muscles of respiration.  CARDIOVASCULAR: S1, S2 normal. No murmurs, rubs, or gallops.  ABDOMEN: Soft, nontender, nondistended. Bowel  sounds present. No organomegaly or mass.  EXTREMITIES: No pedal edema, cyanosis, or clubbing.  NEUROLOGIC: Cranial nerves II through XII are intact. Muscle strength 3/5 in all lower extremities. Sensation intact. Gait not checked.  PSYCHIATRIC: The patient is alert and oriented x 3.  SKIN: No obvious rash, lesion, or ulcer.   Physical Exam LABORATORY PANEL:   CBC Recent Labs  Lab 07/11/17 1711  WBC 8.9  HGB 15.6  HCT 45.4  PLT 282   ------------------------------------------------------------------------------------------------------------------  Chemistries  Recent Labs  Lab 07/11/17 1711  NA 132*  K 4.2  CL 99*  CO2 20*  GLUCOSE 85  BUN 23*  CREATININE 1.28*  CALCIUM 8.6*  AST 23  ALT 21  ALKPHOS 89  BILITOT 0.5   ------------------------------------------------------------------------------------------------------------------  Cardiac Enzymes Recent Labs  Lab 07/11/17 1711  TROPONINI <0.03   ------------------------------------------------------------------------------------------------------------------  RADIOLOGY:  Mr Brain Wo Contrast  Result Date: 07/12/2017 CLINICAL DATA:  Initial evaluation for generalized weakness, difficulty with ambulation. EXAM: MRI HEAD WITHOUT CONTRAST TECHNIQUE: Multiplanar, multiecho pulse sequences of the brain and surrounding structures were obtained without intravenous contrast. COMPARISON:  None. FINDINGS: Brain: Cerebral volume is normal for patient age. No significant cerebral white matter changes identified. No abnormal foci of restricted diffusion to suggest acute or subacute ischemia. Gray-white matter differentiation maintained. No encephalomalacia to suggest chronic infarction. No foci of susceptibility artifact to suggest acute or chronic intracranial hemorrhage. No mass lesion, midline shift or mass effect. No hydrocephalus. No extra-axial fluid collection. Major dural sinuses are grossly patent. Pituitary gland  suprasellar region normal. Midline structures intact and normal. Vascular: Major intracranial vascular flow voids are  maintained. Skull and upper cervical spine: Mild Chiari malformation with the cerebellar tonsils protruding 6 mm through the foramen magnum noted. Upper cervical spine within normal limits. Bone marrow signal intensity normal. No scalp soft tissue abnormality. Sinuses/Orbits: Globes oral soft tissues within normal limits. Mild scattered mucosal thickening within the ethmoid air cells. Paranasal sinuses are otherwise clear. No mastoid effusion. Inner ear structures normal. Other: None. IMPRESSION: 1. No acute intracranial abnormality. 2. Mild Chiari 1 malformation. 3. Otherwise normal brain MRI. Electronically Signed   By: Jeannine Boga M.D.   On: 07/12/2017 00:07   Dg Chest Portable 1 View  Result Date: 07/11/2017 CLINICAL DATA:  Weakness. EXAM: PORTABLE CHEST 1 VIEW COMPARISON:  02/14/2016. Report from frontal and lateral views 09/26/2016 FINDINGS: The cardiomediastinal contours are unchanged. Ill-defined right heart border likely secondary to pectus excavatum which was described previously. This is unchanged. Subsegmental atelectasis or scarring at the left lung base. Pulmonary vasculature is normal. No consolidation, pleural effusion, or pneumothorax. No acute osseous abnormalities are seen. IMPRESSION: Subsegmental left basilar atelectasis or scarring. Otherwise no acute abnormality. Electronically Signed   By: Jeb Levering M.D.   On: 07/11/2017 22:16    ASSESSMENT AND PLAN:   Active Problems:   AKI (acute kidney injury) [facility])   This is a 52 year old female admitted for acute kidney injury. Weakness on both legs. *  AKI: Mild bump in creatinine; likely prerenal.  It does not exactly explain the patient's inability to walk at this time.  Hydrate with intravenous fluid.  Avoid nephrotoxic agents. * Both lower limbs  Weakness: PT/OT to eval.  Etiology is unclear    MRI  brain negative. Also have urinary retention.   Neuro consult called in.   Ordered Lumber and Thorasic spine and lab work. *  Hypertension: Controlled; continue atenolol and prazosin *  Hypothyroidism: Check TSH; continue Synthroid, increased dose due to high TSH. *  DVT prophylaxis: Heparin *  GI prophylaxis: None       All the records are reviewed and case discussed with Care Management/Social Workerr. Management plans discussed with the patient, family and they are in agreement.  CODE STATUS: full.  TOTAL TIME TAKING CARE OF THIS PATIENT: 35 minutes.    POSSIBLE D/C IN 1-2 DAYS, DEPENDING ON CLINICAL CONDITION.   Vaughan Basta M.D on 07/12/2017   Between 7am to 6pm - Pager - [phone]  After 6pm go to [email or web address] - password [email or web address]  Office  [phone]  CC: Primary care physician; [facility]  Note: This dictation was prepared with Dragon dictation along with smaller phrase technology. Any transcriptional errors that result from this process are unintentional.

## 2017-07-13 ENCOUNTER — Inpatient Hospital Stay: Payer: BLUE CROSS/BLUE SHIELD

## 2017-07-13 DIAGNOSIS — R531 Weakness: Secondary | ICD-10-CM

## 2017-07-13 LAB — BASIC METABOLIC PANEL
Anion gap: 6 (ref 5–15)
BUN: 15 mg/dL (ref 6–20)
CO2: 20 mmol/L — ABNORMAL LOW (ref 22–32)
Calcium: 8.3 mg/dL — ABNORMAL LOW (ref 8.9–10.3)
Chloride: 111 mmol/L (ref 101–111)
Creatinine, Ser: 0.98 mg/dL (ref 0.44–1.00)
GFR calc Af Amer: >60 mL/min (ref >60)
GFR calc non Af Amer: >60 mL/min (ref >60)
Glucose, Bld: 133 mg/dL — ABNORMAL HIGH (ref 65–99)
Potassium: 4.2 mmol/L (ref 3.5–5.1)
Sodium: 137 mmol/L (ref 135–145)

## 2017-07-13 LAB — T3, FREE: T3, Free: 2.1 pg/mL (ref 2.0–4.4)

## 2017-07-13 MED ORDER — ENOXAPARIN SODIUM 40 MG/0.4ML ~~LOC~~ SOLN
40.0000 mg | SUBCUTANEOUS | Status: DC
  Administered 2017-07-13 – 2017-07-15 (×3): 40 mg via SUBCUTANEOUS
  Filled 2017-07-13 (×2): qty 0.4

## 2017-07-13 MED ORDER — LORAZEPAM 2 MG/ML IJ SOLN
0.5000 mg | Freq: Two times a day (BID) | INTRAMUSCULAR | Status: DC | PRN
  Administered 2017-07-13: 0.5 mg via INTRAVENOUS
  Filled 2017-07-13: qty 1

## 2017-07-13 MED ORDER — HYDROXYZINE HCL 10 MG PO TABS
10.0000 mg | ORAL_TABLET | Freq: Three times a day (TID) | ORAL | Status: DC | PRN
  Administered 2017-07-13 – 2017-07-16 (×7): 10 mg via ORAL
  Filled 2017-07-13 (×9): qty 1

## 2017-07-13 MED ORDER — TAMSULOSIN HCL 0.4 MG PO CAPS
0.4000 mg | ORAL_CAPSULE | Freq: Every day | ORAL | Status: DC
  Administered 2017-07-13 – 2017-07-16 (×4): 0.4 mg via ORAL
  Filled 2017-07-13 (×4): qty 1

## 2017-07-13 MED ORDER — OXYCODONE-ACETAMINOPHEN 5-325 MG PO TABS
2.0000 | ORAL_TABLET | Freq: Once | ORAL | Status: AC
  Administered 2017-07-13: 2 via ORAL
  Filled 2017-07-13: qty 2

## 2017-07-13 MED ORDER — VITAMIN B-12 1000 MCG PO TABS
1000.0000 ug | ORAL_TABLET | Freq: Every day | ORAL | Status: DC
  Administered 2017-07-13 – 2017-07-16 (×4): 1000 ug via ORAL
  Filled 2017-07-13 (×4): qty 1

## 2017-07-13 MED ORDER — CYANOCOBALAMIN 1000 MCG/ML IJ SOLN
1000.0000 ug | Freq: Once | INTRAMUSCULAR | Status: AC
  Administered 2017-07-13: 1000 ug via SUBCUTANEOUS
  Filled 2017-07-13: qty 1

## 2017-07-13 NOTE — Plan of Care (Signed)

## 2017-07-13 NOTE — Progress Notes (Signed)
[facility] NAME: Jenna Tyler    MR#:  [id number]  DATE OF BIRTH:  [birth year]  SUBJECTIVE:  CHIEF COMPLAINT:   Chief Complaint  Patient presents with  . Weakness   Bilateral lower extremity weakness is mildly better.  Unable to ambulate on own. Mild urinary retention for 1 month.  REVIEW OF SYSTEMS:  CONSTITUTIONAL: No fever, fatigue or weakness.  EYES: No blurred or double vision.  EARS, NOSE, AND THROAT: No tinnitus or ear pain.  RESPIRATORY: No cough, shortness of breath, wheezing or hemoptysis.  CARDIOVASCULAR: No chest pain, orthopnea, edema.  GASTROINTESTINAL: No nausea, vomiting, diarrhea or abdominal pain.  GENITOURINARY: No dysuria, hematuria.  ENDOCRINE: No polyuria, nocturia,  HEMATOLOGY: No anemia, easy bruising or bleeding SKIN: No rash or lesion. MUSCULOSKELETAL: No joint pain or arthritis.   NEUROLOGIC: No tingling, numbness, bilateral lower extremity weakness. PSYCHIATRY: No anxiety or depression.   ROS  DRUG ALLERGIES:   Allergies  Allergen Reactions  . Nsaids Rash    Internal bleeding  Intestinal bleeding  . Adhesive [Tape] Rash  . Aspirin Rash  . Penicillins Rash    VITALS:  Blood pressure 139/79, pulse 73, temperature 98.1 F (36.7 C), temperature source Oral, resp. rate 16, height 5\' 5"  (1.651 m), weight 84.2 kg (185 lb 9.6 oz), SpO2 97 %.  PHYSICAL EXAMINATION:  GENERAL:  52 y.o.-year-old patient lying in the bed with no acute distress.  EYES: Pupils equal, round, reactive to light and accommodation. No scleral icterus. Extraocular muscles intact.  HEENT: Head atraumatic, normocephalic. Oropharynx and nasopharynx clear.  NECK:  Supple, no jugular venous distention. No thyroid enlargement, no tenderness.  LUNGS: Normal breath sounds bilaterally, no wheezing, rales,rhonchi or crepitation. No use of accessory muscles of respiration.  CARDIOVASCULAR: S1, S2 normal. No murmurs, rubs, or  gallops.  ABDOMEN: Soft, nontender, nondistended. Bowel sounds present. No organomegaly or mass.  EXTREMITIES: No pedal edema, cyanosis, or clubbing.  NEUROLOGIC: Cranial nerves II through XII are intact. Muscle strength 4/5 in all lower extremities. Sensation intact. Gait not checked.  PSYCHIATRIC: The patient is alert and oriented x 3.  SKIN: No obvious rash, lesion, or ulcer.   Physical Exam LABORATORY PANEL:   CBC Recent Labs  Lab 07/11/17 1711  WBC 8.9  HGB 15.6  HCT 45.4  PLT 282   ------------------------------------------------------------------------------------------------------------------  Chemistries  Recent Labs  Lab 07/11/17 1711 07/13/17 0527  NA 132* 137  K 4.2 4.2  CL 99* 111  CO2 20* 20*  GLUCOSE 85 133*  BUN 23* 15  CREATININE 1.28* 0.98  CALCIUM 8.6* 8.3*  AST 23  --   ALT 21  --   ALKPHOS 89  --   BILITOT 0.5  --    ------------------------------------------------------------------------------------------------------------------  Cardiac Enzymes Recent Labs  Lab 07/11/17 1711  TROPONINI <0.03   ------------------------------------------------------------------------------------------------------------------  RADIOLOGY:  Mr Brain Wo Contrast  Result Date: 07/12/2017 CLINICAL DATA:  Initial evaluation for generalized weakness, difficulty with ambulation. EXAM: MRI HEAD WITHOUT CONTRAST TECHNIQUE: Multiplanar, multiecho pulse sequences of the brain and surrounding structures were obtained without intravenous contrast. COMPARISON:  None. FINDINGS: Brain: Cerebral volume is normal for patient age. No significant cerebral white matter changes identified. No abnormal foci of restricted diffusion to suggest acute or subacute ischemia. Gray-white matter differentiation maintained. No encephalomalacia to suggest chronic infarction. No foci of susceptibility artifact to suggest acute or chronic intracranial hemorrhage. No mass lesion, midline shift or  mass effect. No hydrocephalus. No  extra-axial fluid collection. Major dural sinuses are grossly patent. Pituitary gland suprasellar region normal. Midline structures intact and normal. Vascular: Major intracranial vascular flow voids are maintained. Skull and upper cervical spine: Mild Chiari malformation with the cerebellar tonsils protruding 6 mm through the foramen magnum noted. Upper cervical spine within normal limits. Bone marrow signal intensity normal. No scalp soft tissue abnormality. Sinuses/Orbits: Globes oral soft tissues within normal limits. Mild scattered mucosal thickening within the ethmoid air cells. Paranasal sinuses are otherwise clear. No mastoid effusion. Inner ear structures normal. Other: None. IMPRESSION: 1. No acute intracranial abnormality. 2. Mild Chiari 1 malformation. 3. Otherwise normal brain MRI. Electronically Signed   By: Jeannine Boga M.D.   On: 07/12/2017 00:07   Mr Thoracic Spine Wo Contrast  Result Date: 07/13/2017 CLINICAL DATA:  Initial evaluation for intermittent tingling in legs for several months, with more acute bilateral lower extremity weakness with urinary retention. History of NF 1. EXAM: MRI THORACIC AND LUMBAR SPINE WITHOUT CONTRAST TECHNIQUE: Multiplanar and multiecho pulse sequences of the thoracic and lumbar spine were obtained without intravenous contrast. COMPARISON:  None available. FINDINGS: MRI THORACIC SPINE FINDINGS Alignment: Vertebral bodies normally aligned with preservation of the normal thoracic kyphosis. No listhesis or malalignment. Vertebrae: Vertebral body heights are maintained without evidence for acute or chronic fracture. Bone marrow signal intensity within normal limits. No discrete or worrisome osseous lesions. No abnormal marrow edema. Cord: Signal intensity within the thoracic spinal cord is normal. No focal signal abnormality. Cord caliber and morphology within normal limits. Conus medullaris terminates inferior to T12.  Paraspinal and other soft tissues: Paraspinous soft tissues demonstrate no acute abnormality. No appreciable plexiform neurofibroma identified. Small layering bilateral pleural effusions with associated atelectasis noted. Visualized lungs are otherwise grossly clear. Visualized visceral structures within normal limits. Disc levels: On counter sequence, a subtle disc protrusion at C6-7 with resultant mild spinal stenosis is noted. T5-6: Tiny right paracentral disc protrusion minimally indents the right ventral thecal sac. No stenosis. T7-8: Central disc protrusion indents the ventral thecal sac, abutting the ventral aspect of the spinal cord. Minimal cord flattening without cord signal changes. Mild facet hypertrophy. Mild spinal stenosis. No significant foraminal encroachment. T8-9: Mild facet hypertrophy.  No stenosis. T9-10: Mild facet hypertrophy.  No stenosis. T10-11: Mild facet hypertrophy.  No stenosis. T11-12: Mild disc bulge with disc desiccation.  No stenosis. MRI LUMBAR SPINE FINDINGS Segmentation: Normal segmentation. Lowest well-formed disc labeled the L5-S1 level. Alignment: Vertebral bodies normally aligned with preservation of the normal lumbar lordosis. No listhesis. Vertebrae: Vertebral body heights are maintained without evidence for acute or chronic fracture. Small chronic endplate Schmorl's node noted at the superior endplate of L1. Bone marrow signal intensity within normal limits. No discrete or worrisome osseous lesions. No abnormal marrow edema. Conus medullaris and cauda equina: Conus extends to the L1-2 level. Conus and cauda equina appear normal. Paraspinal and other soft tissues: Paraspinous soft tissues within normal limits. Visualized visceral structures are normal. No neurofibroma identified. Disc levels: L1-2: Normal interspace. Mild facet and ligamentum flavum hypertrophy. No stenosis. L2-3: Minimal annular disc bulge with disc desiccation. Mild facet and ligament flavum hypertrophy.  No stenosis. L3-4: Mild annular disc bulge with disc desiccation. Mild facet hypertrophy. No canal or neural foraminal stenosis. L4-5: Normal interspace. Moderate facet and ligament flavum hypertrophy. Resultant mild lateral recess narrowing bilaterally without significant canal stenosis. No significant foraminal encroachment. L5-S1: Disc desiccation without significant disc bulge. Mild to moderate facet hypertrophy. Epidural lipomatosis. No significant canal or  foraminal stenosis. IMPRESSION: MR THORACIC SPINE IMPRESSION 1. No acute abnormality within the thoracic spine. Normal MRI appearance of the thoracic spinal cord. 2. Central disc protrusion at T7-8 with resultant mild spinal stenosis. No other significant stenosis within the thoracic spine. 3. Mild multilevel facet hypertrophy within the lower thoracic spine. MR LUMBAR SPINE IMPRESSION 1. No acute abnormality within the lumbar spine. Normal MRI appearance of the conus and cauda equina. 2. Mild degenerative spondylolysis as above without significant canal or foraminal stenosis. Electronically Signed   By: Jeannine Boga M.D.   On: 07/13/2017 04:22   Mr Lumbar Spine Wo Contrast  Result Date: 07/13/2017 CLINICAL DATA:  Initial evaluation for intermittent tingling in legs for several months, with more acute bilateral lower extremity weakness with urinary retention. History of NF 1. EXAM: MRI THORACIC AND LUMBAR SPINE WITHOUT CONTRAST TECHNIQUE: Multiplanar and multiecho pulse sequences of the thoracic and lumbar spine were obtained without intravenous contrast. COMPARISON:  None available. FINDINGS: MRI THORACIC SPINE FINDINGS Alignment: Vertebral bodies normally aligned with preservation of the normal thoracic kyphosis. No listhesis or malalignment. Vertebrae: Vertebral body heights are maintained without evidence for acute or chronic fracture. Bone marrow signal intensity within normal limits. No discrete or worrisome osseous lesions. No abnormal  marrow edema. Cord: Signal intensity within the thoracic spinal cord is normal. No focal signal abnormality. Cord caliber and morphology within normal limits. Conus medullaris terminates inferior to T12. Paraspinal and other soft tissues: Paraspinous soft tissues demonstrate no acute abnormality. No appreciable plexiform neurofibroma identified. Small layering bilateral pleural effusions with associated atelectasis noted. Visualized lungs are otherwise grossly clear. Visualized visceral structures within normal limits. Disc levels: On counter sequence, a subtle disc protrusion at C6-7 with resultant mild spinal stenosis is noted. T5-6: Tiny right paracentral disc protrusion minimally indents the right ventral thecal sac. No stenosis. T7-8: Central disc protrusion indents the ventral thecal sac, abutting the ventral aspect of the spinal cord. Minimal cord flattening without cord signal changes. Mild facet hypertrophy. Mild spinal stenosis. No significant foraminal encroachment. T8-9: Mild facet hypertrophy.  No stenosis. T9-10: Mild facet hypertrophy.  No stenosis. T10-11: Mild facet hypertrophy.  No stenosis. T11-12: Mild disc bulge with disc desiccation.  No stenosis. MRI LUMBAR SPINE FINDINGS Segmentation: Normal segmentation. Lowest well-formed disc labeled the L5-S1 level. Alignment: Vertebral bodies normally aligned with preservation of the normal lumbar lordosis. No listhesis. Vertebrae: Vertebral body heights are maintained without evidence for acute or chronic fracture. Small chronic endplate Schmorl's node noted at the superior endplate of L1. Bone marrow signal intensity within normal limits. No discrete or worrisome osseous lesions. No abnormal marrow edema. Conus medullaris and cauda equina: Conus extends to the L1-2 level. Conus and cauda equina appear normal. Paraspinal and other soft tissues: Paraspinous soft tissues within normal limits. Visualized visceral structures are normal. No neurofibroma  identified. Disc levels: L1-2: Normal interspace. Mild facet and ligamentum flavum hypertrophy. No stenosis. L2-3: Minimal annular disc bulge with disc desiccation. Mild facet and ligament flavum hypertrophy. No stenosis. L3-4: Mild annular disc bulge with disc desiccation. Mild facet hypertrophy. No canal or neural foraminal stenosis. L4-5: Normal interspace. Moderate facet and ligament flavum hypertrophy. Resultant mild lateral recess narrowing bilaterally without significant canal stenosis. No significant foraminal encroachment. L5-S1: Disc desiccation without significant disc bulge. Mild to moderate facet hypertrophy. Epidural lipomatosis. No significant canal or foraminal stenosis. IMPRESSION: MR THORACIC SPINE IMPRESSION 1. No acute abnormality within the thoracic spine. Normal MRI appearance of the thoracic spinal cord. 2. Central  disc protrusion at T7-8 with resultant mild spinal stenosis. No other significant stenosis within the thoracic spine. 3. Mild multilevel facet hypertrophy within the lower thoracic spine. MR LUMBAR SPINE IMPRESSION 1. No acute abnormality within the lumbar spine. Normal MRI appearance of the conus and cauda equina. 2. Mild degenerative spondylolysis as above without significant canal or foraminal stenosis. Electronically Signed   By: Jeannine Boga M.D.   On: 07/13/2017 04:22   Dg Chest Portable 1 View  Result Date: 07/11/2017 CLINICAL DATA:  Weakness. EXAM: PORTABLE CHEST 1 VIEW COMPARISON:  02/14/2016. Report from frontal and lateral views 09/26/2016 FINDINGS: The cardiomediastinal contours are unchanged. Ill-defined right heart border likely secondary to pectus excavatum which was described previously. This is unchanged. Subsegmental atelectasis or scarring at the left lung base. Pulmonary vasculature is normal. No consolidation, pleural effusion, or pneumothorax. No acute osseous abnormalities are seen. IMPRESSION: Subsegmental left basilar atelectasis or scarring.  Otherwise no acute abnormality. Electronically Signed   By: Jeb Levering M.D.   On: 07/11/2017 22:16    ASSESSMENT AND PLAN:   Active Problems:   AKI (acute kidney injury) [facility])   This is a 52 year old female admitted for acute kidney injury. Weakness on both legs.  *Bilateral lower extremity weakness.  Etiology unclear.  MRI brain and thoracic lumbar spine normal. Hyperreflexia. MRI brain negative.  Discussed with Dr. Irish Elders of neurology. Working with physical therapy.  Need skilled nursing facility at discharge  *B12 deficiency.  Will give subcutaneous dose today and start on orals.  *  Hypertension On atenolol and prazosin  *  Hypothyroidism Elevated TSH level.  Dose increased from 125 MCG to 150 MCG  *  DVT prophylaxis: Lovenox  All the records are reviewed and case discussed with Care Management/Social Workerr. Management plans discussed with the patient, family and they are in agreement.  CODE STATUS: full.  TOTAL TIME TAKING CARE OF THIS PATIENT: 35 minutes.   POSSIBLE D/C IN 1-2 DAYS, DEPENDING ON CLINICAL CONDITION.  Neita Carp M.D on 07/13/2017   Between 7am to 6pm - Pager - [phone]  After 6pm go to [email or web address] - password [email or web address] [facility]  Office  [phone]  CC: Primary care physician; [facility]  Note: This dictation was prepared with Dragon dictation along with smaller phrase technology. Any transcriptional errors that result from this process are unintentional.

## 2017-07-13 NOTE — Consult Note (Signed)
States minimal improvement today Past Medical History:  Diagnosis Date  . Asthma   . Hyperlipidemia   . Hypertension   . Hypothyroidism   . 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 Pancreatitis     Past Surgical History:  Procedure Laterality Date  . ABDOMINAL HYSTERECTOMY    . BREAST BIOPSY Right 2012   negative  . CHOLECYSTECTOMY    . 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 OTHER SURGICAL HISTORY     excision of lipoma  . OTHER SURGICAL HISTORY     Abdominal surgery  . THYROID SURGERY     thyroidectomy    Family History  Problem Relation Age of Onset  . Breast cancer Sister 27  . Breast cancer Maternal Aunt 60  . Breast cancer Maternal Grandmother 60  . Breast cancer Maternal Aunt 55    Social History:  reports that she quit smoking about 19 years ago. She has never used smokeless tobacco. She reports that she drank alcohol. She reports that she does not use drugs.  Allergies  Allergen Reactions  . Nsaids Rash    Internal bleeding  Intestinal bleeding  . Adhesive [Tape] Rash  . Aspirin Rash  . Penicillins Rash    Medications:  I have reviewed the patient's current medications. Prior to Admission:  Medications Prior to Admission  Medication Sig Dispense Refill Last Dose  . albuterol (VENTOLIN HFA) 108 (90 Base) MCG/ACT inhaler Inhale 1-2 puffs into the lungs every 6 (six) hours as needed.    PRN at PRN  . hydrOXYzine (VISTARIL) 50 MG capsule Take 50 mg by mouth every 6 (six) hours as needed.   PRN at PRN  . levothyroxine (SYNTHROID, LEVOTHROID) 125 MCG tablet Take 125 mcg by mouth daily.   0 07/11/2017 at AM  . mometasone-formoterol (DULERA) 100-5 MCG/ACT AERO Inhale 2 puffs into the lungs 2 (two) times daily. 1 Inhaler 1 07/11/2017 at PM  . polyethylene glycol (MIRALAX / GLYCOLAX) packet Take 17 g by mouth daily. 14 each 0 PRN at PRN  . pravastatin (PRAVACHOL) 20 MG tablet Take 20 mg by mouth daily.   07/11/2017 at PM  . prazosin (MINIPRESS) 2 MG  capsule Take 1 capsule (2 mg total) by mouth daily with breakfast. 30 capsule 1 07/11/2017 at AM  . QUEtiapine (SEROQUEL) 300 MG tablet Take 450 mg by mouth at bedtime.   07/11/2017 at PM  . QUEtiapine (SEROQUEL) 50 MG tablet Take 50 mg by mouth 3 (three) times daily.   07/11/2017 at PM  . tiZANidine (ZANAFLEX) 4 MG tablet Take 12 mg by mouth at bedtime.   UTD at UTD  . venlafaxine XR (EFFEXOR-XR) 150 MG 24 hr capsule Take 1 capsule (150 mg total) by mouth daily with breakfast. 30 capsule 1 07/12/2017 at AM  . busPIRone (BUSPAR) 5 MG tablet Take 1 tablet (5 mg total) by mouth 3 (three) times daily. (Patient not taking: Reported on 07/12/2017) 90 tablet 1 Not Taking at --  . QUEtiapine (SEROQUEL) 200 MG tablet Take 1 tablet (200 mg total) by mouth at bedtime. (Patient not taking: Reported on 07/12/2017) 30 tablet 1 Not Taking at Unknown time  . QUEtiapine (SEROQUEL) 25 MG tablet Take 1 tablet (25 mg total) by mouth 3 (three) times daily. (Patient not taking: Reported on 07/12/2017) 90 tablet 1 Not Taking at Unknown time   Scheduled: . atenolol  25 mg Oral QHS  . docusate sodium  100 mg Oral BID  . enoxaparin (LOVENOX) injection  40 mg Subcutaneous Q24H  . levothyroxine  150 mcg Oral QAC breakfast  . mometasone-formoterol  2 puff Inhalation BID  . prazosin  2 mg Oral Q breakfast  . QUEtiapine  50 mg Oral TID  . tamsulosin  0.4 mg Oral Daily  . vitamin B-12  1,000 mcg Oral Daily    ROS: History obtained from chart review and the patient  General ROS: negative for - chills, fatigue, fever, night sweats, weight gain or weight loss Psychological ROS: confusion Ophthalmic ROS: negative for - blurry vision, double vision, eye pain or loss of vision ENT ROS: negative for - epistaxis, nasal discharge, oral lesions, sore throat, tinnitus or vertigo Allergy and Immunology ROS: negative for - hives or itchy/watery eyes Hematological and Lymphatic ROS: negative for - bleeding problems, bruising or swollen  lymph nodes Endocrine ROS: negative for - galactorrhea, hair pattern changes, polydipsia/polyuria or temperature intolerance Respiratory ROS: negative for - cough, hemoptysis, shortness of breath or wheezing Cardiovascular ROS: negative for - chest pain, dyspnea on exertion, edema or irregular heartbeat Gastrointestinal ROS: negative for - abdominal pain, diarrhea, hematemesis, nausea/vomiting or stool incontinence Genito-Urinary ROS: as noted in HPI Musculoskeletal ROS: negative for - joint swelling or muscular weakness Neurological ROS: as noted in HPI Dermatological ROS: negative for rash and skin lesion changes  Physical Examination: Blood pressure 139/79, pulse 73, temperature 98.1 F (36.7 C), temperature source Oral, resp. rate 16, height 5\' 5"  (1.651 m), weight 185 lb 9.6 oz (84.2 kg), SpO2 97 %.  HEENT-  Normocephalic, no lesions, without obvious abnormality.  Normal external eye and conjunctiva.  Normal TM's bilaterally.  Normal auditory canals and external ears. Normal external nose, mucus membranes and septum.  Normal pharynx. Cardiovascular- S1, S2 normal, pulses palpable throughout   Lungs- chest clear, no wheezing, rales, normal symmetric air entry Abdomen- soft, non-tender; bowel sounds normal; no masses,  no organomegaly Extremities- no edema Lymph-no adenopathy palpable Musculoskeletal-no joint tenderness, deformity or swelling Skin-warm and dry, no hyperpigmentation, vitiligo, or suspicious lesions  Neurological Examination   Mental Status: Alert, oriented, thought content appropriate.  Speech fluent without evidence of aphasia.  Able to follow 3 step commands without difficulty. Cranial Nerves: II: Discs flat bilaterally; Visual fields grossly normal, pupils equal, round, reactive to light and accommodation III,IV, VI: mild left ptosis, extra-ocular motions intact bilaterally V,VII: decreased right NLF, facial light touch sensation normal bilaterally VIII: hearing  normal bilaterally IX,X: gag reflex present XI: bilateral shoulder shrug XII: midline tongue extension Motor: 3+/5 bilateral hip flexor strength.  Otherwise 5/5 throughout.  Normal tone.   Sensory: Pinprick and light touch intact throughout, bilaterally Deep Tendon Reflexes: 3+ and symmetric throughout Plantars: Right: downgoing   Left: downgoing Cerebellar: Normal finger-to-nose and normal heel-to-shin testing bilaterally Gait: not tested due to safety concerns    Laboratory Studies:   Basic Metabolic Panel: Recent Labs  Lab 07/11/17 1711 07/13/17 0527  NA 132* 137  K 4.2 4.2  CL 99* 111  CO2 20* 20*  GLUCOSE 85 133*  BUN 23* 15  CREATININE 1.28* 0.98  CALCIUM 8.6* 8.3*    Liver Function Tests: Recent Labs  Lab 07/11/17 1711  AST 23  ALT 21  ALKPHOS 89  BILITOT 0.5  PROT 7.4  ALBUMIN 4.3   No results for input(s): LIPASE, AMYLASE in the last 168 hours. No results for input(s): AMMONIA in the last 168 hours.  CBC: Recent Labs  Lab 07/11/17 1711  WBC 8.9  HGB 15.6  HCT 45.4  MCV 80.9  PLT 282    Cardiac Enzymes: Recent Labs  Lab 07/11/17 1711 07/12/17 1510  CKTOTAL 199 97  TROPONINI <0.03  --     BNP: Invalid input(s): POCBNP  CBG: No results for input(s): GLUCAP in the last 168 hours.  Microbiology: No results found for this or any previous visit.  Coagulation Studies: No results for input(s): LABPROT, INR in the last 72 hours.  Urinalysis:  Recent Labs  Lab 07/11/17 0032  COLORURINE YELLOW*  LABSPEC 1.021  PHURINE 5.0  GLUCOSEU NEGATIVE  HGBUR NEGATIVE  BILIRUBINUR NEGATIVE  KETONESUR NEGATIVE  PROTEINUR NEGATIVE  NITRITE NEGATIVE  LEUKOCYTESUR NEGATIVE    Lipid Panel:     Component Value Date/Time   CHOL 283 (H) 12/29/2016 0728   TRIG 170 (H) 12/29/2016 0728   HDL 47 12/29/2016 0728   CHOLHDL 6.0 12/29/2016 0728   VLDL 34 12/29/2016 0728   LDLCALC 202 (H) 12/29/2016 0728    HgbA1C:  Lab Results  Component  Value Date   HGBA1C 5.7 (H) 12/29/2016    Urine Drug Screen:      Component Value Date/Time   LABOPIA NONE DETECTED 07/12/2017 0032   COCAINSCRNUR NONE DETECTED 07/12/2017 0032   LABBENZ POSITIVE (A) 07/12/2017 0032   AMPHETMU NONE DETECTED 07/12/2017 0032   THCU NONE DETECTED 07/12/2017 0032   LABBARB NONE DETECTED 07/12/2017 0032    Alcohol Level: No results for input(s): ETH in the last 168 hours.  Imaging: Mr Brain Wo Contrast  Result Date: 07/12/2017 CLINICAL DATA:  Initial evaluation for generalized weakness, difficulty with ambulation. EXAM: MRI HEAD WITHOUT CONTRAST TECHNIQUE: Multiplanar, multiecho pulse sequences of the brain and surrounding structures were obtained without intravenous contrast. COMPARISON:  None. FINDINGS: Brain: Cerebral volume is normal for patient age. No significant cerebral white matter changes identified. No abnormal foci of restricted diffusion to suggest acute or subacute ischemia. Gray-white matter differentiation maintained. No encephalomalacia to suggest chronic infarction. No foci of susceptibility artifact to suggest acute or chronic intracranial hemorrhage. No mass lesion, midline shift or mass effect. No hydrocephalus. No extra-axial fluid collection. Major dural sinuses are grossly patent. Pituitary gland suprasellar region normal. Midline structures intact and normal. Vascular: Major intracranial vascular flow voids are maintained. Skull and upper cervical spine: Mild Chiari malformation with the cerebellar tonsils protruding 6 mm through the foramen magnum noted. Upper cervical spine within normal limits. Bone marrow signal intensity normal. No scalp soft tissue abnormality. Sinuses/Orbits: Globes oral soft tissues within normal limits. Mild scattered mucosal thickening within the ethmoid air cells. Paranasal sinuses are otherwise clear. No mastoid effusion. Inner ear structures normal. Other: None. IMPRESSION: 1. No acute intracranial abnormality. 2.  Mild Chiari 1 malformation. 3. Otherwise normal brain MRI. Electronically Signed   By: Jeannine Boga M.D.   On: 07/12/2017 00:07   Mr Thoracic Spine Wo Contrast  Result Date: 07/13/2017 CLINICAL DATA:  Initial evaluation for intermittent tingling in legs for several months, with more acute bilateral lower extremity weakness with urinary retention. History of NF 1. EXAM: MRI THORACIC AND LUMBAR SPINE WITHOUT CONTRAST TECHNIQUE: Multiplanar and multiecho pulse sequences of the thoracic and lumbar spine were obtained without intravenous contrast. COMPARISON:  None available. FINDINGS: MRI THORACIC SPINE FINDINGS Alignment: Vertebral bodies normally aligned with preservation of the normal thoracic kyphosis. No listhesis or malalignment. Vertebrae: Vertebral body heights are maintained without evidence for acute or chronic fracture. Bone marrow signal intensity within normal limits. No discrete or worrisome osseous lesions. No abnormal marrow edema.  Cord: Signal intensity within the thoracic spinal cord is normal. No focal signal abnormality. Cord caliber and morphology within normal limits. Conus medullaris terminates inferior to T12. Paraspinal and other soft tissues: Paraspinous soft tissues demonstrate no acute abnormality. No appreciable plexiform neurofibroma identified. Small layering bilateral pleural effusions with associated atelectasis noted. Visualized lungs are otherwise grossly clear. Visualized visceral structures within normal limits. Disc levels: On counter sequence, a subtle disc protrusion at C6-7 with resultant mild spinal stenosis is noted. T5-6: Tiny right paracentral disc protrusion minimally indents the right ventral thecal sac. No stenosis. T7-8: Central disc protrusion indents the ventral thecal sac, abutting the ventral aspect of the spinal cord. Minimal cord flattening without cord signal changes. Mild facet hypertrophy. Mild spinal stenosis. No significant foraminal encroachment.  T8-9: Mild facet hypertrophy.  No stenosis. T9-10: Mild facet hypertrophy.  No stenosis. T10-11: Mild facet hypertrophy.  No stenosis. T11-12: Mild disc bulge with disc desiccation.  No stenosis. MRI LUMBAR SPINE FINDINGS Segmentation: Normal segmentation. Lowest well-formed disc labeled the L5-S1 level. Alignment: Vertebral bodies normally aligned with preservation of the normal lumbar lordosis. No listhesis. Vertebrae: Vertebral body heights are maintained without evidence for acute or chronic fracture. Small chronic endplate Schmorl's node noted at the superior endplate of L1. Bone marrow signal intensity within normal limits. No discrete or worrisome osseous lesions. No abnormal marrow edema. Conus medullaris and cauda equina: Conus extends to the L1-2 level. Conus and cauda equina appear normal. Paraspinal and other soft tissues: Paraspinous soft tissues within normal limits. Visualized visceral structures are normal. No neurofibroma identified. Disc levels: L1-2: Normal interspace. Mild facet and ligamentum flavum hypertrophy. No stenosis. L2-3: Minimal annular disc bulge with disc desiccation. Mild facet and ligament flavum hypertrophy. No stenosis. L3-4: Mild annular disc bulge with disc desiccation. Mild facet hypertrophy. No canal or neural foraminal stenosis. L4-5: Normal interspace. Moderate facet and ligament flavum hypertrophy. Resultant mild lateral recess narrowing bilaterally without significant canal stenosis. No significant foraminal encroachment. L5-S1: Disc desiccation without significant disc bulge. Mild to moderate facet hypertrophy. Epidural lipomatosis. No significant canal or foraminal stenosis. IMPRESSION: MR THORACIC SPINE IMPRESSION 1. No acute abnormality within the thoracic spine. Normal MRI appearance of the thoracic spinal cord. 2. Central disc protrusion at T7-8 with resultant mild spinal stenosis. No other significant stenosis within the thoracic spine. 3. Mild multilevel facet  hypertrophy within the lower thoracic spine. MR LUMBAR SPINE IMPRESSION 1. No acute abnormality within the lumbar spine. Normal MRI appearance of the conus and cauda equina. 2. Mild degenerative spondylolysis as above without significant canal or foraminal stenosis. Electronically Signed   By: Jeannine Boga M.D.   On: 07/13/2017 04:22   Mr Lumbar Spine Wo Contrast  Result Date: 07/13/2017 CLINICAL DATA:  Initial evaluation for intermittent tingling in legs for several months, with more acute bilateral lower extremity weakness with urinary retention. History of NF 1. EXAM: MRI THORACIC AND LUMBAR SPINE WITHOUT CONTRAST TECHNIQUE: Multiplanar and multiecho pulse sequences of the thoracic and lumbar spine were obtained without intravenous contrast. COMPARISON:  None available. FINDINGS: MRI THORACIC SPINE FINDINGS Alignment: Vertebral bodies normally aligned with preservation of the normal thoracic kyphosis. No listhesis or malalignment. Vertebrae: Vertebral body heights are maintained without evidence for acute or chronic fracture. Bone marrow signal intensity within normal limits. No discrete or worrisome osseous lesions. No abnormal marrow edema. Cord: Signal intensity within the thoracic spinal cord is normal. No focal signal abnormality. Cord caliber and morphology within normal limits. Conus medullaris terminates inferior  to T12. Paraspinal and other soft tissues: Paraspinous soft tissues demonstrate no acute abnormality. No appreciable plexiform neurofibroma identified. Small layering bilateral pleural effusions with associated atelectasis noted. Visualized lungs are otherwise grossly clear. Visualized visceral structures within normal limits. Disc levels: On counter sequence, a subtle disc protrusion at C6-7 with resultant mild spinal stenosis is noted. T5-6: Tiny right paracentral disc protrusion minimally indents the right ventral thecal sac. No stenosis. T7-8: Central disc protrusion indents the  ventral thecal sac, abutting the ventral aspect of the spinal cord. Minimal cord flattening without cord signal changes. Mild facet hypertrophy. Mild spinal stenosis. No significant foraminal encroachment. T8-9: Mild facet hypertrophy.  No stenosis. T9-10: Mild facet hypertrophy.  No stenosis. T10-11: Mild facet hypertrophy.  No stenosis. T11-12: Mild disc bulge with disc desiccation.  No stenosis. MRI LUMBAR SPINE FINDINGS Segmentation: Normal segmentation. Lowest well-formed disc labeled the L5-S1 level. Alignment: Vertebral bodies normally aligned with preservation of the normal lumbar lordosis. No listhesis. Vertebrae: Vertebral body heights are maintained without evidence for acute or chronic fracture. Small chronic endplate Schmorl's node noted at the superior endplate of L1. Bone marrow signal intensity within normal limits. No discrete or worrisome osseous lesions. No abnormal marrow edema. Conus medullaris and cauda equina: Conus extends to the L1-2 level. Conus and cauda equina appear normal. Paraspinal and other soft tissues: Paraspinous soft tissues within normal limits. Visualized visceral structures are normal. No neurofibroma identified. Disc levels: L1-2: Normal interspace. Mild facet and ligamentum flavum hypertrophy. No stenosis. L2-3: Minimal annular disc bulge with disc desiccation. Mild facet and ligament flavum hypertrophy. No stenosis. L3-4: Mild annular disc bulge with disc desiccation. Mild facet hypertrophy. No canal or neural foraminal stenosis. L4-5: Normal interspace. Moderate facet and ligament flavum hypertrophy. Resultant mild lateral recess narrowing bilaterally without significant canal stenosis. No significant foraminal encroachment. L5-S1: Disc desiccation without significant disc bulge. Mild to moderate facet hypertrophy. Epidural lipomatosis. No significant canal or foraminal stenosis. IMPRESSION: MR THORACIC SPINE IMPRESSION 1. No acute abnormality within the thoracic spine.  Normal MRI appearance of the thoracic spinal cord. 2. Central disc protrusion at T7-8 with resultant mild spinal stenosis. No other significant stenosis within the thoracic spine. 3. Mild multilevel facet hypertrophy within the lower thoracic spine. MR LUMBAR SPINE IMPRESSION 1. No acute abnormality within the lumbar spine. Normal MRI appearance of the conus and cauda equina. 2. Mild degenerative spondylolysis as above without significant canal or foraminal stenosis. Electronically Signed   By: Jeannine Boga M.D.   On: 07/13/2017 04:22   Dg Chest Portable 1 View  Result Date: 07/11/2017 CLINICAL DATA:  Weakness. EXAM: PORTABLE CHEST 1 VIEW COMPARISON:  02/14/2016. Report from frontal and lateral views 09/26/2016 FINDINGS: The cardiomediastinal contours are unchanged. Ill-defined right heart border likely secondary to pectus excavatum which was described previously. This is unchanged. Subsegmental atelectasis or scarring at the left lung base. Pulmonary vasculature is normal. No consolidation, pleural effusion, or pneumothorax. No acute osseous abnormalities are seen. IMPRESSION: Subsegmental left basilar atelectasis or scarring. Otherwise no acute abnormality. Electronically Signed   By: Jeb Levering M.D.   On: 07/11/2017 22:16     Assessment/Plan: 52 year old female presenting with bilateral hip flexor weakness and urinary retention.  AKI noted with lab work.  TSH elevated but normal T4.  T3 pending.  MRI of the brain shows no acute changes.  Pt is s/p MRI T and L spine without any acute abnormalities On examination there is give away weakness. Pt is able to  hold legs when they are passively raised higher Pt/ot continue  Agree with B12 Encouragement.

## 2017-07-14 LAB — C-REACTIVE PROTEIN: CRP: 0.9 mg/dL (ref <1.0)

## 2017-07-14 MED ORDER — BUTALBITAL-APAP-CAFFEINE 50-325-40 MG PO TABS
1.0000 | ORAL_TABLET | Freq: Four times a day (QID) | ORAL | Status: DC | PRN
  Administered 2017-07-14: 18:00:00 1 via ORAL
  Filled 2017-07-14 (×2): qty 1

## 2017-07-14 MED ORDER — TRAMADOL-ACETAMINOPHEN 37.5-325 MG PO TABS
1.0000 | ORAL_TABLET | Freq: Three times a day (TID) | ORAL | Status: DC | PRN
  Administered 2017-07-14 – 2017-07-16 (×5): 1 via ORAL
  Filled 2017-07-14 (×5): qty 1

## 2017-07-14 NOTE — Plan of Care (Signed)

## 2017-07-14 NOTE — Clinical Social Work Note (Signed)
Clinical Social Work Assessment  Patient Details  Name: Jenna Tyler MRN: [id number] Date of Birth: 09-01-1965  Date of referral:  07/14/17               Reason for consult:  Facility Placement                Permission sought to share information with:  Chartered certified accountant granted to share information::  Yes, Verbal Permission Granted  Name::        Agency::  [place] area SNFs  Relationship::     Contact Information:     Housing/Transportation Living arrangements for the past 2 months:  [place] of Information:  Patient, Medical Team Patient Interpreter Needed:  None Criminal Activity/Legal Involvement Pertinent to Current Situation/Hospitalization:  No - Comment as needed Significant Relationships:  Warehouse manager, Other Family Members, Parents Lives with:  Parents Do you feel safe going back to the place where you live?  Yes Need for family participation in patient care:  No (Coment)  Care giving concerns: PT recommendation for STR   Social Worker assessment / plan:  The CSW met with the patient at bedside to discuss discharge planning. The patient gave verbal permission to begin SNF referral. The CSW explained the process of referral. The patient indicated that she is willing to discharge to SNF but is hoping that she will improve enough to return home.  The CSW has begun the process for referral. Discharge is most likely tomorrow. CSW will update with bed offers as available.  Employment status:  Therapist, music:  Managed Care PT Recommendations:  [place] / Referral to community resources:  [place]  Patient/Family's Response to care:  The patient thanked the CSW.  Patient/Family's Understanding of and Emotional Response to Diagnosis, Current Treatment, and Prognosis:  The patient understands and agrees with the discharge plan at this time.  Emotional  Assessment Appearance:  Appears stated age Attitude/Demeanor/Rapport:  Self-Confident, Gracious, Engaged Affect (typically observed):  Accepting, Appropriate, Pleasant Orientation:  Oriented to Self, Oriented to Place, Oriented to  Time, Oriented to Situation Alcohol / Substance use:  Never Used Psych involvement (Current and /or in the community):  No (Comment)  Discharge Needs  Concerns to be addressed:  Care Coordination, Discharge Planning Concerns Readmission within the last 30 days:  No Current discharge risk:  Chronically ill Barriers to Discharge:  Continued Medical Work up   [employer], LCSW 07/14/2017, 5:14 PM

## 2017-07-14 NOTE — Progress Notes (Signed)
Patient complaining of 6/10 headache pain, received verbal order from Dr. Darvin Neighbours for fioricet Q6 PRN.

## 2017-07-14 NOTE — Progress Notes (Signed)
[place] [facility] [place] [place] NAME: Jenna Tyler    MR#:  [id number]  DATE OF BIRTH:  June 07, 1965  SUBJECTIVE:  CHIEF COMPLAINT:   Chief Complaint  Patient presents with  . Weakness   Bilateral lower extremity weakness is slowly improving.  No sensory problems.  REVIEW OF SYSTEMS:  CONSTITUTIONAL: No fever, fatigue or weakness.  EYES: No blurred or double vision.  EARS, NOSE, AND THROAT: No tinnitus or ear pain.  RESPIRATORY: No cough, shortness of breath, wheezing or hemoptysis.  CARDIOVASCULAR: No chest pain, orthopnea, edema.  GASTROINTESTINAL: No nausea, vomiting, diarrhea or abdominal pain.  GENITOURINARY: No dysuria, hematuria.  ENDOCRINE: No polyuria, nocturia,  HEMATOLOGY: No anemia, easy bruising or bleeding SKIN: No rash or lesion. MUSCULOSKELETAL: No joint pain or arthritis.   NEUROLOGIC: No tingling, numbness, bilateral lower extremity weakness. PSYCHIATRY: No anxiety or depression.   ROS  DRUG ALLERGIES:   Allergies  Allergen Reactions  . Nsaids Rash    Internal bleeding  Intestinal bleeding  . Adhesive [Tape] Rash  . Aspirin Rash  . Penicillins Rash    VITALS:  Blood pressure 121/72, pulse 69, temperature 98.4 F (36.9 C), temperature source Oral, resp. rate 16, height 5\' 5"  (1.651 m), weight 83.2 kg (183 lb 8 oz), SpO2 93 %.  PHYSICAL EXAMINATION:  GENERAL:  52 y.o.-year-old patient lying in the bed with no acute distress.  EYES: Pupils equal, round, reactive to light and accommodation. No scleral icterus. Extraocular muscles intact.  HEENT: Head atraumatic, normocephalic. Oropharynx and nasopharynx clear.  NECK:  Supple, no jugular venous distention. No thyroid enlargement, no tenderness.  LUNGS: Normal breath sounds bilaterally, no wheezing, rales,rhonchi or crepitation. No use of accessory muscles of respiration.  CARDIOVASCULAR: S1, S2 normal. No murmurs, rubs, or gallops.  ABDOMEN: Soft, nontender,  nondistended. Bowel sounds present. No organomegaly or mass.  EXTREMITIES: No pedal edema, cyanosis, or clubbing.  NEUROLOGIC: Cranial nerves II through XII are intact. Muscle strength 4/5 in all lower extremities. Sensation intact. Gait not checked.  PSYCHIATRIC: The patient is alert and oriented x 3.  SKIN: No obvious rash, lesion, or ulcer.   Physical Exam LABORATORY PANEL:   CBC Recent Labs  Lab 07/11/17 1711  WBC 8.9  HGB 15.6  HCT 45.4  PLT 282   ------------------------------------------------------------------------------------------------------------------  Chemistries  Recent Labs  Lab 07/11/17 1711 07/13/17 0527  NA 132* 137  K 4.2 4.2  CL 99* 111  CO2 20* 20*  GLUCOSE 85 133*  BUN 23* 15  CREATININE 1.28* 0.98  CALCIUM 8.6* 8.3*  AST 23  --   ALT 21  --   ALKPHOS 89  --   BILITOT 0.5  --    ------------------------------------------------------------------------------------------------------------------  Cardiac Enzymes Recent Labs  Lab 07/11/17 1711  TROPONINI <0.03   ------------------------------------------------------------------------------------------------------------------  RADIOLOGY:  Mr Thoracic Spine Wo Contrast  Result Date: 07/13/2017 CLINICAL DATA:  Initial evaluation for intermittent tingling in legs for several months, with more acute bilateral lower extremity weakness with urinary retention. History of NF 1. EXAM: MRI THORACIC AND LUMBAR SPINE WITHOUT CONTRAST TECHNIQUE: Multiplanar and multiecho pulse sequences of the thoracic and lumbar spine were obtained without intravenous contrast. COMPARISON:  None available. FINDINGS: MRI THORACIC SPINE FINDINGS Alignment: Vertebral bodies normally aligned with preservation of the normal thoracic kyphosis. No listhesis or malalignment. Vertebrae: Vertebral body heights are maintained without evidence for acute or chronic fracture. Bone marrow signal intensity within normal limits. No discrete or  worrisome osseous lesions.  No abnormal marrow edema. Cord: Signal intensity within the thoracic spinal cord is normal. No focal signal abnormality. Cord caliber and morphology within normal limits. Conus medullaris terminates inferior to T12. Paraspinal and other soft tissues: Paraspinous soft tissues demonstrate no acute abnormality. No appreciable plexiform neurofibroma identified. Small layering bilateral pleural effusions with associated atelectasis noted. Visualized lungs are otherwise grossly clear. Visualized visceral structures within normal limits. Disc levels: On counter sequence, a subtle disc protrusion at C6-7 with resultant mild spinal stenosis is noted. T5-6: Tiny right paracentral disc protrusion minimally indents the right ventral thecal sac. No stenosis. T7-8: Central disc protrusion indents the ventral thecal sac, abutting the ventral aspect of the spinal cord. Minimal cord flattening without cord signal changes. Mild facet hypertrophy. Mild spinal stenosis. No significant foraminal encroachment. T8-9: Mild facet hypertrophy.  No stenosis. T9-10: Mild facet hypertrophy.  No stenosis. T10-11: Mild facet hypertrophy.  No stenosis. T11-12: Mild disc bulge with disc desiccation.  No stenosis. MRI LUMBAR SPINE FINDINGS Segmentation: Normal segmentation. Lowest well-formed disc labeled the L5-S1 level. Alignment: Vertebral bodies normally aligned with preservation of the normal lumbar lordosis. No listhesis. Vertebrae: Vertebral body heights are maintained without evidence for acute or chronic fracture. Small chronic endplate Schmorl's node noted at the superior endplate of L1. Bone marrow signal intensity within normal limits. No discrete or worrisome osseous lesions. No abnormal marrow edema. Conus medullaris and cauda equina: Conus extends to the L1-2 level. Conus and cauda equina appear normal. Paraspinal and other soft tissues: Paraspinous soft tissues within normal limits. Visualized visceral  structures are normal. No neurofibroma identified. Disc levels: L1-2: Normal interspace. Mild facet and ligamentum flavum hypertrophy. No stenosis. L2-3: Minimal annular disc bulge with disc desiccation. Mild facet and ligament flavum hypertrophy. No stenosis. L3-4: Mild annular disc bulge with disc desiccation. Mild facet hypertrophy. No canal or neural foraminal stenosis. L4-5: Normal interspace. Moderate facet and ligament flavum hypertrophy. Resultant mild lateral recess narrowing bilaterally without significant canal stenosis. No significant foraminal encroachment. L5-S1: Disc desiccation without significant disc bulge. Mild to moderate facet hypertrophy. Epidural lipomatosis. No significant canal or foraminal stenosis. IMPRESSION: MR THORACIC SPINE IMPRESSION 1. No acute abnormality within the thoracic spine. Normal MRI appearance of the thoracic spinal cord. 2. Central disc protrusion at T7-8 with resultant mild spinal stenosis. No other significant stenosis within the thoracic spine. 3. Mild multilevel facet hypertrophy within the lower thoracic spine. MR LUMBAR SPINE IMPRESSION 1. No acute abnormality within the lumbar spine. Normal MRI appearance of the conus and cauda equina. 2. Mild degenerative spondylolysis as above without significant canal or foraminal stenosis. Electronically Signed   By: Jeannine Boga M.D.   On: 07/13/2017 04:22   Mr Lumbar Spine Wo Contrast  Result Date: 07/13/2017 CLINICAL DATA:  Initial evaluation for intermittent tingling in legs for several months, with more acute bilateral lower extremity weakness with urinary retention. History of NF 1. EXAM: MRI THORACIC AND LUMBAR SPINE WITHOUT CONTRAST TECHNIQUE: Multiplanar and multiecho pulse sequences of the thoracic and lumbar spine were obtained without intravenous contrast. COMPARISON:  None available. FINDINGS: MRI THORACIC SPINE FINDINGS Alignment: Vertebral bodies normally aligned with preservation of the normal  thoracic kyphosis. No listhesis or malalignment. Vertebrae: Vertebral body heights are maintained without evidence for acute or chronic fracture. Bone marrow signal intensity within normal limits. No discrete or worrisome osseous lesions. No abnormal marrow edema. Cord: Signal intensity within the thoracic spinal cord is normal. No focal signal abnormality. Cord caliber and morphology within normal limits.  Conus medullaris terminates inferior to T12. Paraspinal and other soft tissues: Paraspinous soft tissues demonstrate no acute abnormality. No appreciable plexiform neurofibroma identified. Small layering bilateral pleural effusions with associated atelectasis noted. Visualized lungs are otherwise grossly clear. Visualized visceral structures within normal limits. Disc levels: On counter sequence, a subtle disc protrusion at C6-7 with resultant mild spinal stenosis is noted. T5-6: Tiny right paracentral disc protrusion minimally indents the right ventral thecal sac. No stenosis. T7-8: Central disc protrusion indents the ventral thecal sac, abutting the ventral aspect of the spinal cord. Minimal cord flattening without cord signal changes. Mild facet hypertrophy. Mild spinal stenosis. No significant foraminal encroachment. T8-9: Mild facet hypertrophy.  No stenosis. T9-10: Mild facet hypertrophy.  No stenosis. T10-11: Mild facet hypertrophy.  No stenosis. T11-12: Mild disc bulge with disc desiccation.  No stenosis. MRI LUMBAR SPINE FINDINGS Segmentation: Normal segmentation. Lowest well-formed disc labeled the L5-S1 level. Alignment: Vertebral bodies normally aligned with preservation of the normal lumbar lordosis. No listhesis. Vertebrae: Vertebral body heights are maintained without evidence for acute or chronic fracture. Small chronic endplate Schmorl's node noted at the superior endplate of L1. Bone marrow signal intensity within normal limits. No discrete or worrisome osseous lesions. No abnormal marrow edema.  Conus medullaris and cauda equina: Conus extends to the L1-2 level. Conus and cauda equina appear normal. Paraspinal and other soft tissues: Paraspinous soft tissues within normal limits. Visualized visceral structures are normal. No neurofibroma identified. Disc levels: L1-2: Normal interspace. Mild facet and ligamentum flavum hypertrophy. No stenosis. L2-3: Minimal annular disc bulge with disc desiccation. Mild facet and ligament flavum hypertrophy. No stenosis. L3-4: Mild annular disc bulge with disc desiccation. Mild facet hypertrophy. No canal or neural foraminal stenosis. L4-5: Normal interspace. Moderate facet and ligament flavum hypertrophy. Resultant mild lateral recess narrowing bilaterally without significant canal stenosis. No significant foraminal encroachment. L5-S1: Disc desiccation without significant disc bulge. Mild to moderate facet hypertrophy. Epidural lipomatosis. No significant canal or foraminal stenosis. IMPRESSION: MR THORACIC SPINE IMPRESSION 1. No acute abnormality within the thoracic spine. Normal MRI appearance of the thoracic spinal cord. 2. Central disc protrusion at T7-8 with resultant mild spinal stenosis. No other significant stenosis within the thoracic spine. 3. Mild multilevel facet hypertrophy within the lower thoracic spine. MR LUMBAR SPINE IMPRESSION 1. No acute abnormality within the lumbar spine. Normal MRI appearance of the conus and cauda equina. 2. Mild degenerative spondylolysis as above without significant canal or foraminal stenosis. Electronically Signed   By: Jeannine Boga M.D.   On: 07/13/2017 04:22    ASSESSMENT AND PLAN:   Active Problems:   AKI (acute kidney injury) [facility])   This is a 52 year old female admitted for acute kidney injury. Weakness on both legs.  *Bilateral lower extremity weakness.  Etiology unclear.  MRI brain and thoracic lumbar spine normal. Hyperreflexia. MRI brain negative.  Discussed with Dr. Irish Elders of  neurology. Working with physical therapy.  Need skilled nursing facility at discharge EMG as outpatient?  *B12 deficiency. Injection given yesterday.  On oral B12.  *  Hypertension On atenolol and prazosin  *  Hypothyroidism Elevated TSH level.  Dose increased from 125 MCG to 150 MCG  *  DVT prophylaxis: Lovenox  All the records are reviewed and case discussed with Care Management/Social Workerr. Management plans discussed with the patient, family and they are in agreement.  CODE STATUS: Full.  TOTAL TIME TAKING CARE OF THIS PATIENT: 35 minutes.   POSSIBLE D/C IN 1-2 DAYS, DEPENDING ON CLINICAL CONDITION.  Neita Carp M.D on 07/14/2017   Between 7am to 6pm - Pager - [phone]  After 6pm go to [email or web address] - password [email or web address] [facility]  Office  [phone]  CC: Primary care physician; [facility]  Note: This dictation was prepared with Dragon dictation along with smaller phrase technology. Any transcriptional errors that result from this process are unintentional.

## 2017-07-14 NOTE — NC FL2 (Signed)
[place] LEVEL OF CARE SCREENING TOOL     IDENTIFICATION  Patient Name: Jenna Tyler Birthdate: [birth year] Sex: female Admission Date (Current Location): 07/11/2017  [place] and [place] Number:  Engineering geologist and Address:  [facility], [address], [place], [place] [address]      Provider Number: [id number]  Attending Physician Name and Address:  Hillary Bow, MD  Relative Name and Phone Number:  Charna Busman (Mother) [phone]    Current Level of Care: Hospital Recommended Level of Care: [place] Prior Approval Number:    Date Approved/Denied: 07/14/17 PASRR Number: [id number]  Discharge Plan: SNF    Current Diagnoses: Patient Active Problem List   Diagnosis Date Noted  . AKI (acute kidney injury) ([employer]) 07/12/2017  . HTN (hypertension) 12/29/2016  . COPD exacerbation ([place]) 12/29/2016  . Substance induced mood disorder ([place]) 12/29/2016  . Alcohol use disorder, severe, dependence ([place]) 12/27/2016  . 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 Prediabetes 07/07/2015  . Primary hypothyroidism 04/04/2015  . Pre-diabetes 04/04/2015  . Vitamin D deficiency 04/04/2015  . Anxiety 10/12/2014  . Anxiety, generalized 10/12/2014  . H/O: hypothyroidism 10/12/2014  . Insomnia, persistent 10/12/2014  . Cannot sleep 10/12/2014  . Depression, major, recurrent, moderate ([place]) 10/12/2014  . Drug abuse, opioid type ([place]) 10/12/2014  . 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/2013    Orientation RESPIRATION BLADDER Height & Weight     Self, Time, Situation, Place  Normal Continent Weight: 183 lb 8 oz (83.2 kg) Height:  5\' 5"  (165.1 cm)  BEHAVIORAL SYMPTOMS/MOOD NEUROLOGICAL BOWEL NUTRITION STATUS      Continent Diet(Heart Healthy)  AMBULATORY STATUS COMMUNICATION OF NEEDS Skin   Extensive Assist Verbally  Normal                       Personal Care Assistance Level of Assistance  Bathing, Feeding, Dressing Bathing Assistance: Limited assistance Feeding assistance: Independent Dressing Assistance: Limited assistance     Functional Limitations Info             SPECIAL CARE FACTORS FREQUENCY  PT (By licensed PT), OT (By licensed OT)     PT Frequency: Up to 5X per week OT Frequency: Up to 3X per week            Contractures Contractures Info: Not present    Additional Factors Info  Code Status, Allergies, Psychotropic Code Status Info: Full Allergies Info: Nsaids, Adhesive Tape, Aspirin, Penicillins Psychotropic Info: Vistaril, Seroquel         Current Medications (07/14/2017):  This is the current hospital active medication list Current Facility-Administered Medications  Medication Dose Route Frequency Provider Last Rate Last Dose  . acetaminophen (TYLENOL) tablet 650 mg  650 mg Oral Q6H PRN Harrie Foreman, MD   650 mg at 07/13/17 1633   Or  . acetaminophen (TYLENOL) suppository 650 mg  650 mg Rectal Q6H PRN Harrie Foreman, MD      . albuterol (PROVENTIL) (2.5 MG/3ML) 0.083% nebulizer solution 2.5 mg  2.5 mg Nebulization Q4H PRN Harrie Foreman, MD      . atenolol (TENORMIN) tablet 25 mg  25 mg Oral QHS Harrie Foreman, MD   25 mg at 07/13/17 2228  . butalbital-acetaminophen-caffeine (FIORICET, ESGIC) 50-325-40 MG per tablet 1 tablet  1 tablet Oral Q6H PRN Hillary Bow, MD      .  docusate sodium (COLACE) capsule 100 mg  100 mg Oral BID Harrie Foreman, MD   100 mg at 07/14/17 7824  . enoxaparin (LOVENOX) injection 40 mg  40 mg Subcutaneous Q24H Sudini, Alveta Heimlich, MD   40 mg at 07/13/17 1539  . hydrOXYzine (ATARAX/VISTARIL) tablet 10 mg  10 mg Oral TID PRN Hillary Bow, MD   10 mg at 07/14/17 2353  . levothyroxine (SYNTHROID, LEVOTHROID) tablet 150 mcg  150 mcg Oral QAC breakfast Vaughan Basta, MD   150 mcg at 07/14/17 0630  .  mometasone-formoterol (DULERA) 100-5 MCG/ACT inhaler 2 puff  2 puff Inhalation BID Harrie Foreman, MD   2 puff at 07/14/17 [phone]  . ondansetron (ZOFRAN) tablet 4 mg  4 mg Oral Q6H PRN Harrie Foreman, MD       Or  . ondansetron [place]) injection 4 mg  4 mg Intravenous Q6H PRN Harrie Foreman, MD      . prazosin (MINIPRESS) capsule 2 mg  2 mg Oral Q breakfast Harrie Foreman, MD   2 mg at 07/14/17 3154  . QUEtiapine (SEROQUEL) tablet 50 mg  50 mg Oral TID Vaughan Basta, MD   50 mg at 07/14/17 0823  . tamsulosin (FLOMAX) capsule 0.4 mg  0.4 mg Oral Daily Hillary Bow, MD   0.4 mg at 07/14/17 0823  . traMADol-acetaminophen (ULTRACET) 37.5-325 MG per tablet 1 tablet  1 tablet Oral Q8H PRN Hillary Bow, MD   1 tablet at 07/14/17 1420  . vitamin B-12 (CYANOCOBALAMIN) tablet 1,000 mcg  1,000 mcg Oral Daily Hillary Bow, MD   1,000 mcg at 07/14/17 0086     Discharge Medications: Please see discharge summary for a list of discharge medications.  Relevant Imaging Results:  Relevant Lab Results:   Additional Information SS# [id number]  Zettie Pho, LCSW

## 2017-07-14 NOTE — Consult Note (Signed)
Exam is effort dependent but she is improved today  Past Medical History:  Diagnosis Date  . Asthma   . Hyperlipidemia   . Hypertension   . Hypothyroidism   . 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 Pancreatitis     Past Surgical History:  Procedure Laterality Date  . ABDOMINAL HYSTERECTOMY    . BREAST BIOPSY Right 2012   negative  . CHOLECYSTECTOMY    . 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 OTHER SURGICAL HISTORY     excision of lipoma  . OTHER SURGICAL HISTORY     Abdominal surgery  . THYROID SURGERY     thyroidectomy    Family History  Problem Relation Age of Onset  . Breast cancer Sister 64  . Breast cancer Maternal Aunt 5  . Breast cancer Maternal Grandmother 60  . Breast cancer Maternal Aunt 65    Social History:  reports that she quit smoking about 19 years ago. She has never used smokeless tobacco. She reports that she drank alcohol. She reports that she does not use drugs.  Allergies  Allergen Reactions  . Nsaids Rash    Internal bleeding  Intestinal bleeding  . Adhesive [Tape] Rash  . Aspirin Rash  . Penicillins Rash    Medications:  I have reviewed the patient's current medications. Prior to Admission:  Medications Prior to Admission  Medication Sig Dispense Refill Last Dose  . albuterol (VENTOLIN HFA) 108 (90 Base) MCG/ACT inhaler Inhale 1-2 puffs into the lungs every 6 (six) hours as needed.    PRN at PRN  . hydrOXYzine (VISTARIL) 50 MG capsule Take 50 mg by mouth every 6 (six) hours as needed.   PRN at PRN  . levothyroxine (SYNTHROID, LEVOTHROID) 125 MCG tablet Take 125 mcg by mouth daily.   0 07/11/2017 at AM  . mometasone-formoterol (DULERA) 100-5 MCG/ACT AERO Inhale 2 puffs into the lungs 2 (two) times daily. 1 Inhaler 1 07/11/2017 at PM  . polyethylene glycol (MIRALAX / GLYCOLAX) packet Take 17 g by mouth daily. 14 each 0 PRN at PRN  . pravastatin (PRAVACHOL) 20 MG tablet Take 20 mg by mouth daily.   07/11/2017 at PM  . prazosin  (MINIPRESS) 2 MG capsule Take 1 capsule (2 mg total) by mouth daily with breakfast. 30 capsule 1 07/11/2017 at AM  . QUEtiapine (SEROQUEL) 300 MG tablet Take 450 mg by mouth at bedtime.   07/11/2017 at PM  . QUEtiapine (SEROQUEL) 50 MG tablet Take 50 mg by mouth 3 (three) times daily.   07/11/2017 at PM  . tiZANidine (ZANAFLEX) 4 MG tablet Take 12 mg by mouth at bedtime.   UTD at UTD  . venlafaxine XR (EFFEXOR-XR) 150 MG 24 hr capsule Take 1 capsule (150 mg total) by mouth daily with breakfast. 30 capsule 1 07/12/2017 at AM  . busPIRone (BUSPAR) 5 MG tablet Take 1 tablet (5 mg total) by mouth 3 (three) times daily. (Patient not taking: Reported on 07/12/2017) 90 tablet 1 Not Taking at --  . QUEtiapine (SEROQUEL) 200 MG tablet Take 1 tablet (200 mg total) by mouth at bedtime. (Patient not taking: Reported on 07/12/2017) 30 tablet 1 Not Taking at Unknown time  . QUEtiapine (SEROQUEL) 25 MG tablet Take 1 tablet (25 mg total) by mouth 3 (three) times daily. (Patient not taking: Reported on 07/12/2017) 90 tablet 1 Not Taking at Unknown time   Scheduled: . atenolol  25 mg Oral QHS  . docusate sodium  100 mg Oral BID  .  enoxaparin (LOVENOX) injection  40 mg Subcutaneous Q24H  . levothyroxine  150 mcg Oral QAC breakfast  . mometasone-formoterol  2 puff Inhalation BID  . prazosin  2 mg Oral Q breakfast  . QUEtiapine  50 mg Oral TID  . tamsulosin  0.4 mg Oral Daily  . vitamin B-12  1,000 mcg Oral Daily    Physical Examination: Blood pressure 121/72, pulse 69, temperature 98.4 F (36.9 C), temperature source Oral, resp. rate 16, height 5\' 5"  (1.651 m), weight 183 lb 8 oz (83.2 kg), SpO2 93 %.  HEENT-  Normocephalic, no lesions, without obvious abnormality.  Normal external eye and conjunctiva.  Normal TM's bilaterally.  Normal auditory canals and external ears. Normal external nose, mucus membranes and septum.  Normal pharynx. Cardiovascular- S1, S2 normal, pulses palpable throughout   Lungs- chest clear,  no wheezing, rales, normal symmetric air entry Abdomen- soft, non-tender; bowel sounds normal; no masses,  no organomegaly Extremities- no edema Lymph-no adenopathy palpable Musculoskeletal-no joint tenderness, deformity or swelling Skin-warm and dry, no hyperpigmentation, vitiligo, or suspicious lesions  Neurological Examination   Mental Status: Alert, oriented, thought content appropriate.  Speech fluent without evidence of aphasia.  Able to follow 3 step commands without difficulty. Cranial Nerves: II: Discs flat bilaterally; Visual fields grossly normal, pupils equal, round, reactive to light and accommodation III,IV, VI: mild left ptosis, extra-ocular motions intact bilaterally V,VII: decreased right NLF, facial light touch sensation normal bilaterally VIII: hearing normal bilaterally IX,X: gag reflex present XI: bilateral shoulder shrug XII: midline tongue extension Motor: 3+/5 bilateral hip flexor strength.  Otherwise 5/5 throughout.  Normal tone.   Sensory: Pinprick and light touch intact throughout, bilaterally Deep Tendon Reflexes: 3+ and symmetric throughout Plantars: Right: downgoing   Left: downgoing Cerebellar: Normal finger-to-nose and normal heel-to-shin testing bilaterally Gait: not tested due to safety concerns    Laboratory Studies:   Basic Metabolic Panel: Recent Labs  Lab 07/11/17 1711 07/13/17 0527  NA 132* 137  K 4.2 4.2  CL 99* 111  CO2 20* 20*  GLUCOSE 85 133*  BUN 23* 15  CREATININE 1.28* 0.98  CALCIUM 8.6* 8.3*    Liver Function Tests: Recent Labs  Lab 07/11/17 1711  AST 23  ALT 21  ALKPHOS 89  BILITOT 0.5  PROT 7.4  ALBUMIN 4.3   No results for input(s): LIPASE, AMYLASE in the last 168 hours. No results for input(s): AMMONIA in the last 168 hours.  CBC: Recent Labs  Lab 07/11/17 1711  WBC 8.9  HGB 15.6  HCT 45.4  MCV 80.9  PLT 282    Cardiac Enzymes: Recent Labs  Lab 07/11/17 1711 07/12/17 1510  CKTOTAL 199 97   TROPONINI <0.03  --     BNP: Invalid input(s): POCBNP  CBG: No results for input(s): GLUCAP in the last 168 hours.  Microbiology: No results found for this or any previous visit.  Coagulation Studies: No results for input(s): LABPROT, INR in the last 72 hours.  Urinalysis:  Recent Labs  Lab 07/11/17 0032  COLORURINE YELLOW*  LABSPEC 1.021  PHURINE 5.0  GLUCOSEU NEGATIVE  HGBUR NEGATIVE  BILIRUBINUR NEGATIVE  KETONESUR NEGATIVE  PROTEINUR NEGATIVE  NITRITE NEGATIVE  LEUKOCYTESUR NEGATIVE    Lipid Panel:     Component Value Date/Time   CHOL 283 (H) 12/29/2016 0728   TRIG 170 (H) 12/29/2016 0728   HDL 47 12/29/2016 0728   CHOLHDL 6.0 12/29/2016 0728   VLDL 34 12/29/2016 0728   LDLCALC 202 (H) 12/29/2016 0086  HgbA1C:  Lab Results  Component Value Date   HGBA1C 5.7 (H) 12/29/2016    Urine Drug Screen:      Component Value Date/Time   LABOPIA NONE DETECTED 07/12/2017 0032   COCAINSCRNUR NONE DETECTED 07/12/2017 0032   LABBENZ POSITIVE (A) 07/12/2017 0032   AMPHETMU NONE DETECTED 07/12/2017 0032   THCU NONE DETECTED 07/12/2017 0032   LABBARB NONE DETECTED 07/12/2017 0032    Alcohol Level: No results for input(s): ETH in the last 168 hours.  Imaging: Mr Thoracic Spine Wo Contrast  Result Date: 07/13/2017 CLINICAL DATA:  Initial evaluation for intermittent tingling in legs for several months, with more acute bilateral lower extremity weakness with urinary retention. History of NF 1. EXAM: MRI THORACIC AND LUMBAR SPINE WITHOUT CONTRAST TECHNIQUE: Multiplanar and multiecho pulse sequences of the thoracic and lumbar spine were obtained without intravenous contrast. COMPARISON:  None available. FINDINGS: MRI THORACIC SPINE FINDINGS Alignment: Vertebral bodies normally aligned with preservation of the normal thoracic kyphosis. No listhesis or malalignment. Vertebrae: Vertebral body heights are maintained without evidence for acute or chronic fracture. Bone marrow  signal intensity within normal limits. No discrete or worrisome osseous lesions. No abnormal marrow edema. Cord: Signal intensity within the thoracic spinal cord is normal. No focal signal abnormality. Cord caliber and morphology within normal limits. Conus medullaris terminates inferior to T12. Paraspinal and other soft tissues: Paraspinous soft tissues demonstrate no acute abnormality. No appreciable plexiform neurofibroma identified. Small layering bilateral pleural effusions with associated atelectasis noted. Visualized lungs are otherwise grossly clear. Visualized visceral structures within normal limits. Disc levels: On counter sequence, a subtle disc protrusion at C6-7 with resultant mild spinal stenosis is noted. T5-6: Tiny right paracentral disc protrusion minimally indents the right ventral thecal sac. No stenosis. T7-8: Central disc protrusion indents the ventral thecal sac, abutting the ventral aspect of the spinal cord. Minimal cord flattening without cord signal changes. Mild facet hypertrophy. Mild spinal stenosis. No significant foraminal encroachment. T8-9: Mild facet hypertrophy.  No stenosis. T9-10: Mild facet hypertrophy.  No stenosis. T10-11: Mild facet hypertrophy.  No stenosis. T11-12: Mild disc bulge with disc desiccation.  No stenosis. MRI LUMBAR SPINE FINDINGS Segmentation: Normal segmentation. Lowest well-formed disc labeled the L5-S1 level. Alignment: Vertebral bodies normally aligned with preservation of the normal lumbar lordosis. No listhesis. Vertebrae: Vertebral body heights are maintained without evidence for acute or chronic fracture. Small chronic endplate Schmorl's node noted at the superior endplate of L1. Bone marrow signal intensity within normal limits. No discrete or worrisome osseous lesions. No abnormal marrow edema. Conus medullaris and cauda equina: Conus extends to the L1-2 level. Conus and cauda equina appear normal. Paraspinal and other soft tissues: Paraspinous soft  tissues within normal limits. Visualized visceral structures are normal. No neurofibroma identified. Disc levels: L1-2: Normal interspace. Mild facet and ligamentum flavum hypertrophy. No stenosis. L2-3: Minimal annular disc bulge with disc desiccation. Mild facet and ligament flavum hypertrophy. No stenosis. L3-4: Mild annular disc bulge with disc desiccation. Mild facet hypertrophy. No canal or neural foraminal stenosis. L4-5: Normal interspace. Moderate facet and ligament flavum hypertrophy. Resultant mild lateral recess narrowing bilaterally without significant canal stenosis. No significant foraminal encroachment. L5-S1: Disc desiccation without significant disc bulge. Mild to moderate facet hypertrophy. Epidural lipomatosis. No significant canal or foraminal stenosis. IMPRESSION: MR THORACIC SPINE IMPRESSION 1. No acute abnormality within the thoracic spine. Normal MRI appearance of the thoracic spinal cord. 2. Central disc protrusion at T7-8 with resultant mild spinal stenosis. No other significant stenosis within  the thoracic spine. 3. Mild multilevel facet hypertrophy within the lower thoracic spine. MR LUMBAR SPINE IMPRESSION 1. No acute abnormality within the lumbar spine. Normal MRI appearance of the conus and cauda equina. 2. Mild degenerative spondylolysis as above without significant canal or foraminal stenosis. Electronically Signed   By: Jeannine Boga M.D.   On: 07/13/2017 04:22   Mr Lumbar Spine Wo Contrast  Result Date: 07/13/2017 CLINICAL DATA:  Initial evaluation for intermittent tingling in legs for several months, with more acute bilateral lower extremity weakness with urinary retention. History of NF 1. EXAM: MRI THORACIC AND LUMBAR SPINE WITHOUT CONTRAST TECHNIQUE: Multiplanar and multiecho pulse sequences of the thoracic and lumbar spine were obtained without intravenous contrast. COMPARISON:  None available. FINDINGS: MRI THORACIC SPINE FINDINGS Alignment: Vertebral bodies  normally aligned with preservation of the normal thoracic kyphosis. No listhesis or malalignment. Vertebrae: Vertebral body heights are maintained without evidence for acute or chronic fracture. Bone marrow signal intensity within normal limits. No discrete or worrisome osseous lesions. No abnormal marrow edema. Cord: Signal intensity within the thoracic spinal cord is normal. No focal signal abnormality. Cord caliber and morphology within normal limits. Conus medullaris terminates inferior to T12. Paraspinal and other soft tissues: Paraspinous soft tissues demonstrate no acute abnormality. No appreciable plexiform neurofibroma identified. Small layering bilateral pleural effusions with associated atelectasis noted. Visualized lungs are otherwise grossly clear. Visualized visceral structures within normal limits. Disc levels: On counter sequence, a subtle disc protrusion at C6-7 with resultant mild spinal stenosis is noted. T5-6: Tiny right paracentral disc protrusion minimally indents the right ventral thecal sac. No stenosis. T7-8: Central disc protrusion indents the ventral thecal sac, abutting the ventral aspect of the spinal cord. Minimal cord flattening without cord signal changes. Mild facet hypertrophy. Mild spinal stenosis. No significant foraminal encroachment. T8-9: Mild facet hypertrophy.  No stenosis. T9-10: Mild facet hypertrophy.  No stenosis. T10-11: Mild facet hypertrophy.  No stenosis. T11-12: Mild disc bulge with disc desiccation.  No stenosis. MRI LUMBAR SPINE FINDINGS Segmentation: Normal segmentation. Lowest well-formed disc labeled the L5-S1 level. Alignment: Vertebral bodies normally aligned with preservation of the normal lumbar lordosis. No listhesis. Vertebrae: Vertebral body heights are maintained without evidence for acute or chronic fracture. Small chronic endplate Schmorl's node noted at the superior endplate of L1. Bone marrow signal intensity within normal limits. No discrete or  worrisome osseous lesions. No abnormal marrow edema. Conus medullaris and cauda equina: Conus extends to the L1-2 level. Conus and cauda equina appear normal. Paraspinal and other soft tissues: Paraspinous soft tissues within normal limits. Visualized visceral structures are normal. No neurofibroma identified. Disc levels: L1-2: Normal interspace. Mild facet and ligamentum flavum hypertrophy. No stenosis. L2-3: Minimal annular disc bulge with disc desiccation. Mild facet and ligament flavum hypertrophy. No stenosis. L3-4: Mild annular disc bulge with disc desiccation. Mild facet hypertrophy. No canal or neural foraminal stenosis. L4-5: Normal interspace. Moderate facet and ligament flavum hypertrophy. Resultant mild lateral recess narrowing bilaterally without significant canal stenosis. No significant foraminal encroachment. L5-S1: Disc desiccation without significant disc bulge. Mild to moderate facet hypertrophy. Epidural lipomatosis. No significant canal or foraminal stenosis. IMPRESSION: MR THORACIC SPINE IMPRESSION 1. No acute abnormality within the thoracic spine. Normal MRI appearance of the thoracic spinal cord. 2. Central disc protrusion at T7-8 with resultant mild spinal stenosis. No other significant stenosis within the thoracic spine. 3. Mild multilevel facet hypertrophy within the lower thoracic spine. MR LUMBAR SPINE IMPRESSION 1. No acute abnormality within the lumbar spine.  Normal MRI appearance of the conus and cauda equina. 2. Mild degenerative spondylolysis as above without significant canal or foraminal stenosis. Electronically Signed   By: Jeannine Boga M.D.   On: 07/13/2017 04:22     Assessment/Plan: 52 year old female presenting with bilateral hip flexor weakness and urinary retention.  AKI noted with lab work.  TSH elevated but normal T4.  T3 pending.  MRI of the brain shows no acute changes.  Pt is s/p MRI T and L spine without any acute abnormalities On examination there is  give away weakness. Pt is able to hold legs when they are passively raised higher.  Pt/ot continue  No further imaging from neuro stand point

## 2017-07-15 MED ORDER — VENLAFAXINE HCL ER 75 MG PO CP24: 150.0000 mg | ORAL_CAPSULE | Freq: Every day | ORAL | Status: DC

## 2017-07-15 MED ORDER — VENLAFAXINE HCL ER 75 MG PO CP24
150.0000 mg | ORAL_CAPSULE | Freq: Every day | ORAL | Status: DC
  Administered 2017-07-15 – 2017-07-16 (×2): 150 mg via ORAL
  Filled 2017-07-15 (×2): qty 2

## 2017-07-15 MED ORDER — QUETIAPINE FUMARATE 300 MG PO TABS
300.0000 mg | ORAL_TABLET | Freq: Every day | ORAL | Status: DC
  Administered 2017-07-15: 21:00:00 300 mg via ORAL
  Filled 2017-07-15 (×2): qty 1

## 2017-07-15 MED ORDER — QUETIAPINE FUMARATE 25 MG PO TABS
50.0000 mg | ORAL_TABLET | Freq: Two times a day (BID) | ORAL | Status: DC
  Administered 2017-07-16 (×2): 50 mg via ORAL
  Filled 2017-07-15 (×2): qty 2

## 2017-07-15 NOTE — Plan of Care (Signed)

## 2017-07-15 NOTE — Progress Notes (Signed)
Subjective: Strength improved.  Has not ambulated with therapy today.    Objective: Current vital signs: BP (!) 94/59 (BP Location: Right Arm)   Pulse 86   Temp 97.9 F (36.6 C) (Oral)   Resp 18   Ht '5\' 5"'$  (1.651 m)   Wt 86.8 kg (191 lb 6.4 oz)   SpO2 93%   BMI 31.85 kg/m  Vital signs in last 24 hours: Temp:  [97.5 F (36.4 C)-97.9 F (36.6 C)] 97.9 F (36.6 C) (03/25 1209) Pulse Rate:  [66-86] 86 (03/25 1209) Resp:  [18-20] 18 (03/25 1209) BP: (94-117)/(59-72) 94/59 (03/25 1209) SpO2:  [93 %-96 %] 93 % (03/25 1209) Weight:  [86.8 kg (191 lb 6.4 oz)] 86.8 kg (191 lb 6.4 oz) (03/25 0500)  Intake/Output from previous day: 03/24 0701 - 03/25 0700 In: 240 [P.O.:240] Out: -  Intake/Output this shift: No intake/output data recorded. Nutritional status: Diet Heart Room service appropriate? Yes; Fluid consistency: Thin  Neurologic Exam: Mental Status: Alert, oriented, thought content appropriate.  Speech fluent without evidence of aphasia.  Able to follow 3 step commands without difficulty. Cranial Nerves: II: Discs flat bilaterally; Visual fields grossly normal, pupils equal, round, reactive to light and accommodation III,IV, VI: mild left ptosis, extra-ocular motions intact bilaterally V,VII: decreased right NLF, facial light touch sensation normal bilaterally VIII: hearing normal bilaterally IX,X: gag reflex present XI: bilateral shoulder shrug XII: midline tongue extension Motor: Gives very little hip flexor strength with forma testing but when not testing patient able to lift legs well to get them from under the covers and adjust in bed.  UE strength remains intact.   Sensory: Pinprick and light touch intact throughout, bilaterally   Lab Results: Basic Metabolic Panel: Recent Labs  Lab 07/11/17 1711 07/13/17 0527  NA 132* 137  K 4.2 4.2  CL 99* 111  CO2 20* 20*  GLUCOSE 85 133*  BUN 23* 15  CREATININE 1.28* 0.98  CALCIUM 8.6* 8.3*    Liver Function  Tests: Recent Labs  Lab 07/11/17 1711  AST 23  ALT 21  ALKPHOS 89  BILITOT 0.5  PROT 7.4  ALBUMIN 4.3   No results for input(s): LIPASE, AMYLASE in the last 168 hours. No results for input(s): AMMONIA in the last 168 hours.  CBC: Recent Labs  Lab 07/11/17 1711  WBC 8.9  HGB 15.6  HCT 45.4  MCV 80.9  PLT 282    Cardiac Enzymes: Recent Labs  Lab 07/11/17 1711 07/12/17 1510  CKTOTAL 199 97  TROPONINI <0.03  --     Lipid Panel: No results for input(s): CHOL, TRIG, HDL, CHOLHDL, VLDL, LDLCALC in the last 168 hours.  CBG: No results for input(s): GLUCAP in the last 168 hours.  Microbiology: No results found for this or any previous visit.  Coagulation Studies: No results for input(s): LABPROT, INR in the last 72 hours.  Imaging: No results found.  Medications:  I have reviewed the patient's current medications. Scheduled: . atenolol  25 mg Oral QHS  . docusate sodium  100 mg Oral BID  . enoxaparin (LOVENOX) injection  40 mg Subcutaneous Q24H  . levothyroxine  150 mcg Oral QAC breakfast  . mometasone-formoterol  2 puff Inhalation BID  . prazosin  2 mg Oral Q breakfast  . QUEtiapine  300 mg Oral QHS  . [START ON 07/16/2017] QUEtiapine  50 mg Oral BID  . tamsulosin  0.4 mg Oral Daily  . vitamin B-12  1,000 mcg Oral Daily  Assessment/Plan: LE strength improving.  Imaging without abnormalities to explain presentation. B12 low normal at 234.  ESR, CRP, CK normal.  B1 and AchR ab pending.    Agree with continued therapy.     LOS: 3 days   Alexis Goodell, MD Neurology [phone] 07/15/2017  1:05 PM

## 2017-07-15 NOTE — Progress Notes (Signed)
Clinical Social Worker (CSW) presented bed offers to patient and she chose [facility]. [facility] liaison is aware of accepted bed offer and will start [facility] SNF authorization.   [employer], LCSW [phone]

## 2017-07-15 NOTE — Progress Notes (Signed)
Occupational Therapy Treatment Patient Details Name: Jenna Tyler MRN: [id number] DOB: 17-Sep-1965 Today's Date: 07/15/2017    History of present illness Pt. is a 52 y.o. female who was admitted to [facility]  with AMS, somnolence, AKI with urine toxicology showing benzodiazepines, and PCP, the latter detected with chronic use of antihistimines. Pt. PMHx includes: HTN, Hypothyroidism, neurofibromatosis type1.   OT comments  Pt educated about further rec for safe set up at home in bathroom, rec hallway from couch to bathroom have a night light in outlet or turn TV on to light up room to see better since she sleeps on couch.  Also rec she purchase slippers that have a heel on them vs slip on slippers to prevent falls.  Discussed and reviewed [employer]  equipment catalog for grab bars, shower chair for tub.  Continue to rec SNF for continued rehab in order to be as independent as possible before going home to live with her mother and nephew.    Follow Up Recommendations  SNF    Equipment Recommendations       Recommendations for Other Services      Precautions / Restrictions Precautions Precautions: Fall Restrictions Weight Bearing Restrictions: No       Mobility Bed Mobility                  Transfers                      Balance                                           ADL either performed or assessed with clinical judgement   ADL Overall ADL's : Needs assistance/impaired                                       General ADL Comments: Pt educated about further rec for safe set up at home in bathroom, rec hallway from couch to bathroom have a night light in outlet or turn TV on to light up room to see better since she sleeps on couch.  Also rec she purchase slippers that have a heel on them vs slip on slippers to prevent falls.  Discussed and reviewed [employer]  equipment catalog for grab bars, shower chair for tub.         Vision       Perception     Praxis      Cognition                                                Exercises     Shoulder Instructions       General Comments      Pertinent Vitals/ Pain       Pain Assessment: No/denies pain  Home Living                                          Prior Functioning/Environment              Frequency  Min 2X/week  Progress Toward Goals  OT Goals(current goals can now be found in the care plan section)  Progress towards OT goals: Progressing toward goals  Acute Rehab OT Goals Patient Stated Goal: To be able to walk to bathroom , and the sink  OT Goal Formulation: With patient Potential to Achieve Goals: Good  Plan Discharge plan remains appropriate    Co-evaluation                 AM-PAC PT "6 Clicks" Daily Activity     Outcome Measure   Help from another person eating meals?: None Help from another person taking care of personal grooming?: None Help from another person toileting, which includes using toliet, bedpan, or urinal?: A Lot Help from another person bathing (including washing, rinsing, drying)?: A Lot Help from another person to put on and taking off regular upper body clothing?: A Lot Help from another person to put on and taking off regular lower body clothing?: A Lot 6 Click Score: 16    End of Session    OT Visit Diagnosis: Unsteadiness on feet (R26.81);Muscle weakness (generalized) (M62.81)   Activity Tolerance Patient tolerated treatment well   Patient Left in chair;with chair alarm set   Nurse Communication          Time: 1530-1610 OT Time Calculation (min): 40 min  Charges: OT General Charges $OT Visit: 1 Visit OT Treatments $Self Care/Home Management : 38-52 mins  Chrys Racer, OTR/L ascom [phone] 07/15/17, 4:18 PM

## 2017-07-15 NOTE — Progress Notes (Signed)
Physical Therapy Treatment Patient Details Name: Jenna Tyler MRN: [id number] DOB: 01-01-1966 Today's Date: 07/15/2017    History of Present Illness Pt. is a 52 y.o. female who was admitted to [facility]  with AMS, somnolence, AKI with urine toxicology showing benzodiazepines, and PCP, the latter detected with chronic use of antihistimines. Pt. PMHx includes: HTN, Hypothyroidism, neurofibromatosis type1.    PT Comments    Pt is making good progress towards goals with increased ambulation this date. During ambulation, close chair follow as pt still unsteady and fatigues quickly with increased distance. Good endurance with there-ex. Still not at baseline level. Continue to recommend SNF for safety and continued strengthening. Will progress as tolerated.   Follow Up Recommendations  SNF     Equipment Recommendations  Rolling walker with 5" wheels;Other (comment)    Recommendations for Other Services       Precautions / Restrictions Precautions Precautions: Fall Restrictions Weight Bearing Restrictions: No    Mobility  Bed Mobility Overal bed mobility: Modified Independent             General bed mobility comments: Improved ease of transfer. Once seated at EOB, upright posture noted  Transfers Overall transfer level: Needs assistance Equipment used: Rolling walker (2 wheeled) Transfers: Sit to/from Stand Sit to Stand: Min guard         General transfer comment: needs min assist for balance once standing with RW. Slight unsteadiness noted. Slightly impulsive noted.  Ambulation/Gait Ambulation/Gait assistance: Min assist Ambulation Distance (Feet): 80 Feet Assistive device: Rolling walker (2 wheeled) Gait Pattern/deviations: Step-to pattern     General Gait Details: Very short step length. Pt ambulates with decreased B knee flexion in swing phase, very stiff posture. Pt very fearful of falling, slow gait speed. 1 LOB noted with min assist for correction. Fatigues  with increased distance   Stairs            Wheelchair Mobility    Modified Rankin (Stroke Patients Only)       Balance                                            Cognition Arousal/Alertness: Awake/alert Behavior During Therapy: WFL for tasks assessed/performed Overall Cognitive Status: Within Functional Limits for tasks assessed                                        Exercises Other Exercises Other Exercises: Performed seated ther-ex on B LE including alt. marching, LAQ, hip abd/add, and quad sets. All ther-ex performed x 12 reps with cga. Encouraged to continue ther-ex during hospital stay. Other Exercises: Ambulated to bathroom, slightly impulsive. 1 LOB during transfer on/off commode. Needs close supervision/cga for all hygiene task.    General Comments        Pertinent Vitals/Pain Pain Assessment: No/denies pain    Home Living                      Prior Function            PT Goals (current goals can now be found in the care plan section) Acute Rehab PT Goals Patient Stated Goal: to go home PT Goal Formulation: With patient Time For Goal Achievement: 07/25/17 Potential to Achieve Goals: Good Progress towards PT  goals: Progressing toward goals    Frequency    Min 2X/week      PT Plan Current plan remains appropriate    Co-evaluation              AM-PAC PT "6 Clicks" Daily Activity  Outcome Measure  Difficulty turning over in bed (including adjusting bedclothes, sheets and blankets)?: None Difficulty moving from lying on back to sitting on the side of the bed? : None Difficulty sitting down on and standing up from a chair with arms (e.g., wheelchair, bedside commode, etc,.)?: Unable Help needed moving to and from a bed to chair (including a wheelchair)?: A Little Help needed walking in hospital room?: A Little Help needed climbing 3-5 steps with a railing? : A Little 6 Click Score: 18     End of Session Equipment Utilized During Treatment: Gait belt Activity Tolerance: Patient tolerated treatment well Patient left: in chair(no alarm in room; RN notified) Nurse Communication: Mobility status PT Visit Diagnosis: Muscle weakness (generalized) (M62.81);Difficulty in walking, not elsewhere classified (R26.2)     Time: 8250-5397 PT Time Calculation (min) (ACUTE ONLY): 26 min  Charges:  $Gait Training: 8-22 mins $Therapeutic Exercise: 8-22 mins                    G Codes:       Greggory Stallion, PT, DPT [phone]    Tomoko Sandra 07/15/2017, 4:43 PM

## 2017-07-15 NOTE — Progress Notes (Signed)
Jenna Tyler at [place] NAME: Jenna Tyler    MR#:  [id number]  DATE OF BIRTH:  03/26/1966  SUBJECTIVE:  CHIEF COMPLAINT:   Chief Complaint  Patient presents with  . Weakness   Bilateral lower extremity weakness still present but patient is improving. Headaches have resolved  REVIEW OF SYSTEMS:  CONSTITUTIONAL: No fever, fatigue or weakness.  EYES: No blurred or double vision.  EARS, NOSE, AND THROAT: No tinnitus or ear pain.  RESPIRATORY: No cough, shortness of breath, wheezing or hemoptysis.  CARDIOVASCULAR: No chest pain, orthopnea, edema.  GASTROINTESTINAL: No nausea, vomiting, diarrhea or abdominal pain.  GENITOURINARY: No dysuria, hematuria.  ENDOCRINE: No polyuria, nocturia,  HEMATOLOGY: No anemia, easy bruising or bleeding SKIN: No rash or lesion. MUSCULOSKELETAL: No joint pain or arthritis.   NEUROLOGIC: No tingling, numbness, bilateral lower extremity weakness. PSYCHIATRY: No anxiety or depression.   ROS  DRUG ALLERGIES:   Allergies  Allergen Reactions  . Nsaids Rash    Internal bleeding  Intestinal bleeding  . Adhesive [Tape] Rash  . Aspirin Rash  . Penicillins Rash    VITALS:  Blood pressure (!) 94/59, pulse 86, temperature 97.9 F (36.6 C), temperature source Oral, resp. rate 18, height 5\' 5"  (1.651 m), weight 86.8 kg (191 lb 6.4 oz), SpO2 93 %.  PHYSICAL EXAMINATION:  GENERAL:  52 y.o.-year-old patient lying in the bed with no acute distress.  EYES: Pupils equal, round, reactive to light and accommodation. No scleral icterus. Extraocular muscles intact.  HEENT: Head atraumatic, normocephalic. Oropharynx and nasopharynx clear.  NECK:  Supple, no jugular venous distention. No thyroid enlargement, no tenderness.  LUNGS: Normal breath sounds bilaterally, no wheezing, rales,rhonchi or crepitation. No use of accessory muscles of respiration.  CARDIOVASCULAR: S1, S2 normal. No murmurs, rubs, or gallops.  ABDOMEN:  Soft, nontender, nondistended. Bowel sounds present. No organomegaly or mass.  EXTREMITIES: No pedal edema, cyanosis, or clubbing.  NEUROLOGIC: Cranial nerves II through XII are intact. Muscle strength 4/5 in all lower extremities. Sensation intact. Gait not checked.  PSYCHIATRIC: The patient is alert and oriented x 3.  SKIN: No obvious rash, lesion, or ulcer.   Physical Exam LABORATORY PANEL:   CBC Recent Labs  Lab 07/11/17 1711  WBC 8.9  HGB 15.6  HCT 45.4  PLT 282   ------------------------------------------------------------------------------------------------------------------  Chemistries  Recent Labs  Lab 07/11/17 1711 07/13/17 0527  NA 132* 137  K 4.2 4.2  CL 99* 111  CO2 20* 20*  GLUCOSE 85 133*  BUN 23* 15  CREATININE 1.28* 0.98  CALCIUM 8.6* 8.3*  AST 23  --   ALT 21  --   ALKPHOS 89  --   BILITOT 0.5  --    ------------------------------------------------------------------------------------------------------------------  Cardiac Enzymes Recent Labs  Lab 07/11/17 1711  TROPONINI <0.03   ------------------------------------------------------------------------------------------------------------------  RADIOLOGY:  No results found.  ASSESSMENT AND PLAN:   Active Problems:   AKI (acute kidney injury) [facility])   This is a 52 year old female admitted for acute kidney injury. Weakness on both legs.  *Bilateral lower extremity weakness.  Etiology unclear.  MRI brain and thoracic lumbar spine normal. Hyperreflexia. MRI brain negative.  Discussed with Dr. Irish Elders of neurology. Working with physical therapy.  Needs skilled nursing facility at discharge EMG as outpatient?  *B12 deficiency.   On oral B12.  *  Hypertension On atenolol and prazosin  *  Hypothyroidism Elevated TSH level. Dose increased from 125 MCG to 150 MCG.  *  DVT  prophylaxis: Lovenox  All the records are reviewed and case discussed with Care Management/Social  Workerr. Management plans discussed with the patient, family and they are in agreement.  CODE STATUS: Full.  TOTAL TIME TAKING CARE OF THIS PATIENT: 35 minutes.   POSSIBLE D/C IN 1-2 DAYS, DEPENDING ON CLINICAL CONDITION.  Neita Carp M.D on 07/15/2017   Between 7am to 6pm - Pager - [phone]  After 6pm go to [email or web address] - password [email or web address] [place] [facility]  Office  [phone]  CC: Primary care physician; [facility] [place]  Note: This dictation was prepared with Dragon dictation along with smaller phrase technology. Any transcriptional errors that result from this process are unintentional.

## 2017-07-16 LAB — CREATININE, SERUM
Creatinine, Ser: 1.21 mg/dL — ABNORMAL HIGH (ref 0.44–1.00)
GFR calc Af Amer: 59 mL/min — ABNORMAL LOW (ref >60)
GFR calc non Af Amer: 51 mL/min — ABNORMAL LOW (ref >60)

## 2017-07-16 LAB — CBC
HCT: 46.3 % (ref 35.0–47.0)
Hemoglobin: 15.2 g/dL (ref 12.0–16.0)
MCH: 27.7 pg (ref 26.0–34.0)
MCHC: 32.9 g/dL (ref 32.0–36.0)
MCV: 84 fL (ref 80.0–100.0)
Platelets: 230 10*3/uL (ref 150–440)
RBC: 5.51 MIL/uL — ABNORMAL HIGH (ref 3.80–5.20)
RDW: 14.2 % (ref 11.5–14.5)
WBC: 6.8 10*3/uL (ref 3.6–11.0)

## 2017-07-16 LAB — METHYLMALONIC ACID, SERUM: Methylmalonic Acid, Quantitative: 148 nmol/L (ref 0–378)

## 2017-07-16 MED ORDER — LEVOTHYROXINE SODIUM 150 MCG PO TABS: 150.0000 ug | ORAL_TABLET | Freq: Every day | ORAL | Status: DC

## 2017-07-16 MED ORDER — QUETIAPINE FUMARATE 200 MG PO TABS: 200.0000 mg | ORAL_TABLET | Freq: Every day | ORAL | Status: DC

## 2017-07-16 MED ORDER — CYANOCOBALAMIN 1000 MCG PO TABS: 1000.0000 ug | ORAL_TABLET | Freq: Every day | ORAL | Status: DC

## 2017-07-16 NOTE — Discharge Summary (Signed)
[place] [facility] [place] NAME: Jenna Tyler    MR#:  [id number]  DATE OF BIRTH:  Aug 03, 1965  DATE OF ADMISSION:  07/11/2017 ADMITTING PHYSICIAN: Harrie Foreman, MD  DATE OF DISCHARGE: 07/16/2017  PRIMARY CARE PHYSICIAN: [facility]   ADMISSION DIAGNOSIS:  Hypocalcemia [E83.51] Weakness [R53.1] AKI (acute kidney injury) ([place]) [N17.9] Hypothyroidism, unspecified type [E03.9]  DISCHARGE DIAGNOSIS:  Active Problems:   AKI (acute kidney injury) ([place])   SECONDARY DIAGNOSIS:   Past Medical History:  Diagnosis Date  . Asthma   . Hyperlipidemia   . Hypertension   . Hypothyroidism   . 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 Pancreatitis      ADMITTING HISTORY  Chief Complaint: Weakness HPI: Patient with past medical history of neurofibromatosis type I, hypothyroidism and hypertension presents to the emergency department with weakness and somnolence.  The patient's daughter reported that her mom had some change in mental status that varied between somnolence and confusion.  The patient admits to not feeling herself over the last 2 weeks and specifically endorses increasing weakness for the last 2 days.  She states that she is unable to walk.  Prior to admission the patient was ambulatory does not have a history of neurologic deficits.  Thus the new onset of her symptoms prompted the emergency department staff to call the hospitalist service for admission.    HOSPITAL COURSE:    This is a 52 year old female admitted for acute kidney injury. Weakness on both legs.  *Bilateral lower extremity weakness.  Etiology unclear.  MRI brain and thoracic lumbar spine normal. Hyperreflexia. MRI brain negative.   Needs skilled nursing facility at discharge for further physical therapy. Seen by neurology and patient. OP f/u with [facility]y   *B12 deficiency.   On oral B12.  *  Hypertension On atenolol and prazosin  *Hypothyroidism Elevated TSH level. Dose increased from 125 MCG to 150 MCG.  Patient stable for discharge home for further physical therapy at skilled nursing facility.  CONSULTS OBTAINED:  Treatment Team:  Catarina Hartshorn, MD  DRUG ALLERGIES:   Allergies  Allergen Reactions  . Nsaids Rash    Internal bleeding  Intestinal bleeding  . Adhesive [Tape] Rash  . Aspirin Rash  . Penicillins Rash    DISCHARGE MEDICATIONS:   Allergies as of 07/16/2017      Reactions   Nsaids Rash   Internal bleeding  Intestinal bleeding   Adhesive [tape] Rash   Aspirin Rash   Penicillins Rash      Medication List    TAKE these medications   busPIRone 5 MG tablet Commonly known as:  BUSPAR Take 1 tablet (5 mg total) by mouth 3 (three) times daily.   cyanocobalamin 1000 MCG tablet Take 1 tablet (1,000 mcg total) by mouth daily. Start taking on:  07/17/2017   hydrOXYzine 50 MG capsule Commonly known as:  VISTARIL Take 50 mg by mouth every 6 (six) hours as needed.   levothyroxine 150 MCG tablet Commonly known as:  SYNTHROID, LEVOTHROID Take 1 tablet (150 mcg total) by mouth daily. What changed:    medication strength  how much to take   mometasone-formoterol 100-5 MCG/ACT Aero Commonly known as:  DULERA Inhale 2 puffs into the lungs 2 (two) times daily.   polyethylene glycol packet Commonly known as:  MIRALAX / GLYCOLAX Take 17 g by mouth daily.   pravastatin 20 MG tablet Commonly known as:  PRAVACHOL Take  20 mg by mouth daily.   prazosin 2 MG capsule Commonly known as:  MINIPRESS Take 1 capsule (2 mg total) by mouth daily with breakfast.   QUEtiapine 50 MG tablet Commonly known as:  SEROQUEL Take 50 mg by mouth 3 (three) times daily. What changed:    Another medication with the same name was changed. Make sure you understand how and when to take each.  Another medication with the same name was removed. Continue taking  this medication, and follow the directions you see here.   QUEtiapine 200 MG tablet Commonly known as:  SEROQUEL Take 1 tablet (200 mg total) by mouth at bedtime. What changed:    medication strength  how much to take  Another medication with the same name was removed. Continue taking this medication, and follow the directions you see here.   tiZANidine 4 MG tablet Commonly known as:  ZANAFLEX Take 12 mg by mouth at bedtime.   venlafaxine XR 150 MG 24 hr capsule Commonly known as:  EFFEXOR-XR Take 1 capsule (150 mg total) by mouth daily with breakfast.   VENTOLIN HFA 108 (90 Base) MCG/ACT inhaler Generic drug:  albuterol Inhale 1-2 puffs into the lungs every 6 (six) hours as needed.       Today   VITAL SIGNS:  Blood pressure 106/68, pulse 93, temperature 97.6 F (36.4 C), temperature source Oral, resp. rate 18, height 5\' 5"  (1.651 m), weight 86.5 kg (190 lb 9.6 oz), SpO2 94 %.  I/O:    Intake/Output Summary (Last 24 hours) at 07/16/2017 1505 Last data filed at 07/16/2017 0900 Gross per 24 hour  Intake 720 ml  Output -  Net 720 ml    PHYSICAL EXAMINATION:  Physical Exam  GENERAL:  52 y.o.-year-old patient lying in the bed with no acute distress.  LUNGS: Normal breath sounds bilaterally, no wheezing, rales,rhonchi or crepitation. No use of accessory muscles of respiration.  CARDIOVASCULAR: S1, S2 normal. No murmurs, rubs, or gallops.  ABDOMEN: Soft, non-tender, non-distended. Bowel sounds present. No organomegaly or mass.  NEUROLOGIC: Moves all 4 extremities. PSYCHIATRIC: The patient is alert and oriented x 3.  SKIN: No obvious rash, lesion, or ulcer.   DATA REVIEW:   CBC Recent Labs  Lab 07/16/17 0400  WBC 6.8  HGB 15.2  HCT 46.3  PLT 230    Chemistries  Recent Labs  Lab 07/11/17 1711 07/13/17 0527 07/16/17 0400  NA 132* 137  --   K 4.2 4.2  --   CL 99* 111  --   CO2 20* 20*  --   GLUCOSE 85 133*  --   BUN 23* 15  --   CREATININE 1.28* 0.98  1.21*  CALCIUM 8.6* 8.3*  --   AST 23  --   --   ALT 21  --   --   ALKPHOS 89  --   --   BILITOT 0.5  --   --     Cardiac Enzymes Recent Labs  Lab 07/11/17 1711  TROPONINI <0.03    Microbiology Results  No results found for this or any previous visit.  RADIOLOGY:  No results found.  Follow up with PCP in 1 week.  Management plans discussed with the patient, family and they are in agreement.  CODE STATUS:     Code Status Orders  (From admission, onward)        Start     Ordered   07/12/17 0410  Full code  Continuous  07/12/17 0409    Code Status History    Date Active Date Inactive Code Status Order ID Comments User Context   12/28/2016 2201 01/07/2017 1822 Full Code [id number]  Gonzella Lex, MD Inpatient   12/28/2016 2201 12/28/2016 2201 Full Code [id number]  Gonzella Lex, MD Inpatient   12/27/2016 0251 12/28/2016 2128 Full Code [id number]  Schaevitz, Randall An, MD ED      TOTAL TIME TAKING CARE OF THIS PATIENT ON DAY OF DISCHARGE: more than 30 minutes.   Leia Alf Rishon Thilges M.D on 07/16/2017 at 3:05 PM  Between 7am to 6pm - Pager - [phone]  After 6pm go to [email or web address] - password [email or web address] [facility]  Office  [phone]  CC: Primary care physician; [facility]  Note: This dictation was prepared with Dragon dictation along with smaller phrase technology. Any transcriptional errors that result from this process are unintentional.

## 2017-07-16 NOTE — Clinical Social Work Note (Signed)
Patient to be d/c'ed today to [facility] room 702.  Patient and family agreeable to plans will transport via ems RN to call report to [phone].  Evette Cristal, MSW, [place]

## 2017-07-16 NOTE — Plan of Care (Signed)
  Problem: Education: Goal: Knowledge of General Education information will improve Outcome: Progressing   Problem: Health Behavior/Discharge Planning: Goal: Ability to manage health-related needs will improve Outcome: Progressing   Problem: Clinical Measurements: Goal: Ability to maintain clinical measurements within normal limits will improve Outcome: Progressing Goal: Will remain free from infection Outcome: Progressing Goal: Diagnostic test results will improve Outcome: Progressing Goal: Respiratory complications will improve Outcome: Progressing Goal: Cardiovascular complication will be avoided Outcome: Progressing   Problem: Activity: Goal: Risk for activity intolerance will decrease Outcome: Progressing   Problem: Nutrition: Goal: Adequate nutrition will be maintained Outcome: Progressing   Problem: Coping: Goal: Level of anxiety will decrease Outcome: Progressing   Problem: Elimination: Goal: Will not experience complications related to bowel motility Outcome: Progressing Goal: Will not experience complications related to urinary retention Outcome: Progressing   Problem: Pain Managment: Goal: General experience of comfort will improve Outcome: Progressing   Problem: Skin Integrity: Goal: Risk for impaired skin integrity will decrease Outcome: Progressing   

## 2017-07-16 NOTE — Plan of Care (Signed)

## 2017-07-16 NOTE — Progress Notes (Signed)
Pt tobe discharged to [facility]. A/o. No resp distress. Chair today. No voiced c/o. [facility] with [facility] and gave report. Vss. slx2 d/cd. Ems called to transport pt.

## 2017-07-16 NOTE — Progress Notes (Signed)
[facility] [place] [facility] [place] NAME: Jenna Tyler    MR#:  [id number]  DATE OF BIRTH:  February 22, 1966  SUBJECTIVE:  CHIEF COMPLAINT:   Chief Complaint  Patient presents with  . Weakness   Continues to have weakness in bilateral lower extremities. Headaches have resolved.  REVIEW OF SYSTEMS:  CONSTITUTIONAL: No fever, fatigue or weakness.  EYES: No blurred or double vision.  EARS, NOSE, AND THROAT: No tinnitus or ear pain.  RESPIRATORY: No cough, shortness of breath, wheezing or hemoptysis.  CARDIOVASCULAR: No chest pain, orthopnea, edema.  GASTROINTESTINAL: No nausea, vomiting, diarrhea or abdominal pain.  GENITOURINARY: No dysuria, hematuria.  ENDOCRINE: No polyuria, nocturia,  HEMATOLOGY: No anemia, easy bruising or bleeding SKIN: No rash or lesion. MUSCULOSKELETAL: No joint pain or arthritis.   NEUROLOGIC: No tingling, numbness, bilateral lower extremity weakness. PSYCHIATRY: No anxiety or depression.   ROS  DRUG ALLERGIES:   Allergies  Allergen Reactions  . Nsaids Rash    Internal bleeding  Intestinal bleeding  . Adhesive [Tape] Rash  . Aspirin Rash  . Penicillins Rash    VITALS:  Blood pressure 122/75, pulse 67, temperature 97.9 F (36.6 C), temperature source Oral, resp. rate 18, height 5\' 5"  (1.651 m), weight 86.5 kg (190 lb 9.6 oz), SpO2 94 %.  PHYSICAL EXAMINATION:  GENERAL:  52 y.o.-year-old patient lying in the bed with no acute distress.  EYES: Pupils equal, round, reactive to light and accommodation. No scleral icterus. Extraocular muscles intact.  HEENT: Head atraumatic, normocephalic. Oropharynx and nasopharynx clear.  NECK:  Supple, no jugular venous distention. No thyroid enlargement, no tenderness.  LUNGS: Normal breath sounds bilaterally, no wheezing, rales,rhonchi or crepitation. No use of accessory muscles of respiration.  CARDIOVASCULAR: S1, S2 normal. No murmurs, rubs, or gallops.  ABDOMEN: Soft, nontender,  nondistended. Bowel sounds present. No organomegaly or mass.  EXTREMITIES: No pedal edema, cyanosis, or clubbing.  NEUROLOGIC: Cranial nerves II through XII are intact. Muscle strength 4/5 in all lower extremities. Sensation intact. Gait not checked.  PSYCHIATRIC: The patient is alert and oriented x 3.  SKIN: No obvious rash, lesion, or ulcer.   Physical Exam LABORATORY PANEL:   CBC Recent Labs  Lab 07/16/17 0400  WBC 6.8  HGB 15.2  HCT 46.3  PLT 230   ------------------------------------------------------------------------------------------------------------------  Chemistries  Recent Labs  Lab 07/11/17 1711 07/13/17 0527 07/16/17 0400  NA 132* 137  --   K 4.2 4.2  --   CL 99* 111  --   CO2 20* 20*  --   GLUCOSE 85 133*  --   BUN 23* 15  --   CREATININE 1.28* 0.98 1.21*  CALCIUM 8.6* 8.3*  --   AST 23  --   --   ALT 21  --   --   ALKPHOS 89  --   --   BILITOT 0.5  --   --    ------------------------------------------------------------------------------------------------------------------  Cardiac Enzymes Recent Labs  Lab 07/11/17 1711  TROPONINI <0.03   ------------------------------------------------------------------------------------------------------------------  RADIOLOGY:  No results found.  ASSESSMENT AND PLAN:   Active Problems:   AKI (acute kidney injury) [facility])   This is a 52 year old female admitted for acute kidney injury. Weakness on both legs.  *Bilateral lower extremity weakness.  Etiology unclear.  MRI brain and thoracic lumbar spine normal. Hyperreflexia. MRI brain negative.   Needs skilled nursing facility at discharge for further physical therapy.   *B12 deficiency.   On oral B12.  *  Hypertension On atenolol and prazosin  *  Hypothyroidism Elevated TSH level. Dose increased from 125 MCG to 150 MCG.  *  DVT prophylaxis: Lovenox   waiting for bed at skilled nursing facility at this time.  All the records are reviewed  and case discussed with Care Management/Social Workerr. Management plans discussed with the patient, family and they are in agreement.  CODE STATUS: Full.  TOTAL TIME TAKING CARE OF THIS PATIENT: 35 minutes.   POSSIBLE D/C IN 1-2 DAYS, DEPENDING ON CLINICAL CONDITION.  Neita Carp M.D on 07/16/2017   Between 7am to 6pm - Pager - [phone]  After 6pm go to [email or web address] - password [email or web address] [facility] [facility]  Office  [phone]  CC: Primary care physician; [facility] [place]  Note: This dictation was prepared with Dragon dictation along with smaller phrase technology. Any transcriptional errors that result from this process are unintentional.

## 2017-07-16 NOTE — Discharge Instructions (Signed)

## 2017-07-16 NOTE — Progress Notes (Signed)
Discharged at this time  Via ems to [facility] no c/o

## 2017-07-17 LAB — VITAMIN B1: Vitamin B1 (Thiamine): 107.7 nmol/L (ref 66.5–200.0)

## 2017-07-24 LAB — ACETYLCHOLINE RECEPTOR AB, ALL
Acety choline binding ab: <0.03 nmol/L (ref 0.00–0.24)
Acetylchol Block Ab: 11 % (ref 0–25)
Acetylcholine Modulat Ab: <12 % (ref 0–20)

## 2017-07-26 ENCOUNTER — Other Ambulatory Visit: Payer: Self-pay | Admitting: Neurology

## 2017-07-26 ENCOUNTER — Ambulatory Visit (HOSPITAL_COMMUNITY): Payer: Self-pay

## 2017-07-26 ENCOUNTER — Ambulatory Visit
Admission: RE | Admit: 2017-07-26 | Discharge: 2017-07-26 | Disposition: A | Discharge status: Home / Self Care | Payer: BLUE CROSS/BLUE SHIELD | Source: Ambulatory Visit | Attending: Neurology | Admitting: Neurology

## 2017-07-26 DIAGNOSIS — R29898 Other symptoms and signs involving the musculoskeletal system: Secondary | ICD-10-CM

## 2017-07-26 DIAGNOSIS — R531 Weakness: Secondary | ICD-10-CM | POA: Diagnosis present

## 2017-07-26 LAB — CSF CELL COUNT WITH DIFFERENTIAL
Eosinophils, CSF: 0 %
Lymphs, CSF: 100 %
Monocyte-Macrophage-Spinal Fluid: 0 %
Other Cells, CSF: 0
RBC Count, CSF: 4 /mm3 — ABNORMAL HIGH (ref 0–3)
Segmented Neutrophils-CSF: 0 %
Tube #: 3
WBC, CSF: 2 /mm3 (ref 0–5)

## 2017-07-26 LAB — GLUCOSE, CSF: Glucose, CSF: 51 mg/dL (ref 40–70)

## 2017-07-26 LAB — GLUCOSE, RANDOM: Glucose, Bld: 95 mg/dL (ref 65–99)

## 2017-07-26 LAB — PROTEIN, CSF: Total  Protein, CSF: 29 mg/dL (ref 15–45)

## 2017-07-26 MED ORDER — ACETAMINOPHEN 500 MG PO TABS
1000.0000 mg | ORAL_TABLET | Freq: Four times a day (QID) | ORAL | Status: DC | PRN
  Administered 2017-07-26: 1000 mg via ORAL
  Filled 2017-07-26 (×2): qty 2

## 2017-07-26 MED ORDER — LIDOCAINE HCL (PF) 1 % IJ SOLN
5.0000 mL | Freq: Once | INTRAMUSCULAR | Status: AC
  Administered 2017-07-26: 5 mL
  Filled 2017-07-26: qty 5

## 2017-07-26 NOTE — Progress Notes (Signed)
Pt to [facility] waiting room from [facility] clinic, paperwork included hand written orders for pt's procedure and necessary labs.  Bloodwork noted to be missing from orders.  Dr. Melrose Nakayama called and VORB for blood orders taken and entered by this nurse.

## 2017-07-26 NOTE — Discharge Instructions (Signed)
Lumbar Puncture, Care After °Refer to this sheet in the next few weeks. These instructions provide you with information on caring for yourself after your procedure. Your health care provider may also give you more specific instructions. Your treatment has been planned according to current medical practices, but problems sometimes occur. Call your health care provider if you have any problems or questions after your procedure. °What can I expect after the procedure? °After your procedure, it is typical to have the following sensations: °· Mild discomfort or pain at the insertion site. °· Mild headache that is relieved with pain medicines. ° °Follow these instructions at home: ° °· Avoid lifting anything heavier than 10 lb (4.5 kg) for at least 12 hours after the procedure. °· Drink enough fluids to keep your urine clear or pale yellow. °Contact a health care provider if: °· You have fever or chills. °· You have nausea or vomiting. °· You have a headache that lasts for more than 2 days. °Get help right away if: °· You have any numbness or tingling in your legs. °· You are unable to control your bowel or bladder. °· You have bleeding or swelling in your back at the insertion site. °· You are dizzy or faint. °This information is not intended to replace advice given to you by your health care provider. Make sure you discuss any questions you have with your health care provider. °Document Released: 04/14/2013 Document Revised: 09/15/2015 Document Reviewed: 12/16/2012 °Elsevier Interactive Patient Education © 2017 [employer] Inc. ° °

## 2017-07-26 NOTE — Progress Notes (Signed)
Patient here post LP procedure per Dr Posey Pronto, vitals stable, given tylenol 1000mg  for headache. HOB flat at this time per orders.

## 2017-07-29 LAB — CSF CULTURE W GRAM STAIN: Culture: NO GROWTH

## 2017-07-29 LAB — IGG 4: IgG, Subclass 4: 3 mg/dL (ref 2–96)

## 2017-07-31 LAB — HSV(HERPES SMPLX VRS)ABS-I+II(IGG)-CSF: HSV Type I/II Ab, IgG CSF: <0.34 IV (ref <=0.89)

## 2017-08-01 LAB — OLIGOCLONAL BANDS, CSF + SERM

## 2017-09-10 DIAGNOSIS — M79605 Pain in left leg: Secondary | ICD-10-CM | POA: Insufficient documentation

## 2017-09-10 DIAGNOSIS — M79604 Pain in right leg: Secondary | ICD-10-CM | POA: Insufficient documentation

## 2017-09-16 ENCOUNTER — Observation Stay: Payer: BLUE CROSS/BLUE SHIELD

## 2017-09-16 ENCOUNTER — Observation Stay
Admission: EM | Admit: 2017-09-16 | Discharge: 2017-09-18 | Disposition: A | Discharge status: Skilled Nursing Facility | Payer: BLUE CROSS/BLUE SHIELD | Attending: Internal Medicine | Admitting: Internal Medicine

## 2017-09-16 ENCOUNTER — Other Ambulatory Visit: Payer: Self-pay

## 2017-09-16 DIAGNOSIS — Z886 Allergy status to analgesic agent status: Secondary | ICD-10-CM | POA: Insufficient documentation

## 2017-09-16 DIAGNOSIS — Z880 Allergy status to penicillin: Secondary | ICD-10-CM | POA: Insufficient documentation

## 2017-09-16 DIAGNOSIS — E039 Hypothyroidism, unspecified: Secondary | ICD-10-CM | POA: Diagnosis not present

## 2017-09-16 DIAGNOSIS — E785 Hyperlipidemia, unspecified: Secondary | ICD-10-CM | POA: Diagnosis not present

## 2017-09-16 DIAGNOSIS — Z87891 Personal history of nicotine dependence: Secondary | ICD-10-CM | POA: Insufficient documentation

## 2017-09-16 DIAGNOSIS — Z79899 Other long term (current) drug therapy: Secondary | ICD-10-CM | POA: Insufficient documentation

## 2017-09-16 DIAGNOSIS — I10 Essential (primary) hypertension: Secondary | ICD-10-CM | POA: Insufficient documentation

## 2017-09-16 DIAGNOSIS — J45909 Unspecified asthma, uncomplicated: Secondary | ICD-10-CM | POA: Diagnosis not present

## 2017-09-16 DIAGNOSIS — Q8501 Neurofibromatosis, type 1: Secondary | ICD-10-CM | POA: Diagnosis not present

## 2017-09-16 DIAGNOSIS — R531 Weakness: Secondary | ICD-10-CM | POA: Diagnosis present

## 2017-09-16 DIAGNOSIS — Z888 Allergy status to other drugs, medicaments and biological substances status: Secondary | ICD-10-CM | POA: Diagnosis not present

## 2017-09-16 DIAGNOSIS — M6281 Muscle weakness (generalized): Secondary | ICD-10-CM | POA: Insufficient documentation

## 2017-09-16 DIAGNOSIS — G610 Guillain-Barre syndrome: Principal | ICD-10-CM | POA: Insufficient documentation

## 2017-09-16 HISTORY — DX: Guillain-Barre syndrome: G61.0

## 2017-09-16 LAB — COMPREHENSIVE METABOLIC PANEL
ALT: 18 U/L (ref 14–54)
AST: 23 U/L (ref 15–41)
Albumin: 4 g/dL (ref 3.5–5.0)
Alkaline Phosphatase: 81 U/L (ref 38–126)
Anion gap: 9 (ref 5–15)
BUN: 22 mg/dL — ABNORMAL HIGH (ref 6–20)
CO2: 20 mmol/L — ABNORMAL LOW (ref 22–32)
Calcium: 9.2 mg/dL (ref 8.9–10.3)
Chloride: 106 mmol/L (ref 101–111)
Creatinine, Ser: 1.16 mg/dL — ABNORMAL HIGH (ref 0.44–1.00)
GFR calc Af Amer: >60 mL/min (ref >60)
GFR calc non Af Amer: 54 mL/min — ABNORMAL LOW (ref >60)
Glucose, Bld: 152 mg/dL — ABNORMAL HIGH (ref 65–99)
Potassium: 3.8 mmol/L (ref 3.5–5.1)
Sodium: 135 mmol/L (ref 135–145)
Total Bilirubin: 0.6 mg/dL (ref 0.3–1.2)
Total Protein: 7 g/dL (ref 6.5–8.1)

## 2017-09-16 LAB — CBC WITH DIFFERENTIAL/PLATELET
Basophils Absolute: 0.1 10*3/uL (ref 0–0.1)
Basophils Relative: 1 %
Eosinophils Absolute: 0.1 10*3/uL (ref 0–0.7)
Eosinophils Relative: 1 %
HCT: 43.5 % (ref 35.0–47.0)
Hemoglobin: 15 g/dL (ref 12.0–16.0)
Lymphocytes Relative: 20 %
Lymphs Abs: 1.5 10*3/uL (ref 1.0–3.6)
MCH: 28 pg (ref 26.0–34.0)
MCHC: 34.4 g/dL (ref 32.0–36.0)
MCV: 81.4 fL (ref 80.0–100.0)
Monocytes Absolute: 0.6 10*3/uL (ref 0.2–0.9)
Monocytes Relative: 8 %
Neutro Abs: 5.2 10*3/uL (ref 1.4–6.5)
Neutrophils Relative %: 70 %
Platelets: 263 10*3/uL (ref 150–440)
RBC: 5.35 MIL/uL — ABNORMAL HIGH (ref 3.80–5.20)
RDW: 14.6 % — ABNORMAL HIGH (ref 11.5–14.5)
WBC: 7.5 10*3/uL (ref 3.6–11.0)

## 2017-09-16 LAB — TROPONIN I: Troponin I: <0.03 ng/mL (ref <0.03)

## 2017-09-16 LAB — TSH: TSH: 14.88 u[IU]/mL — ABNORMAL HIGH (ref 0.350–4.500)

## 2017-09-16 LAB — T4, FREE: Free T4: 0.94 ng/dL (ref 0.82–1.77)

## 2017-09-16 LAB — CK
Total CK: 17 U/L — ABNORMAL LOW (ref 38–234)
Total CK: 18 U/L — ABNORMAL LOW (ref 38–234)

## 2017-09-16 MED ORDER — ALBUTEROL SULFATE (2.5 MG/3ML) 0.083% IN NEBU
2.5000 mg | INHALATION_SOLUTION | Freq: Four times a day (QID) | RESPIRATORY_TRACT | Status: DC | PRN
Start: 2017-09-16 — End: 2017-09-18

## 2017-09-16 MED ORDER — MORPHINE SULFATE (PF) 4 MG/ML IV SOLN
4.0000 mg | Freq: Once | INTRAVENOUS | Status: AC
  Administered 2017-09-16: 4 mg via INTRAVENOUS
  Filled 2017-09-16: qty 1

## 2017-09-16 MED ORDER — MOMETASONE FURO-FORMOTEROL FUM 100-5 MCG/ACT IN AERO
2.0000 | INHALATION_SPRAY | Freq: Two times a day (BID) | RESPIRATORY_TRACT | Status: DC
  Administered 2017-09-16 – 2017-09-18 (×4): 2 via RESPIRATORY_TRACT
  Filled 2017-09-16: qty 8.8

## 2017-09-16 MED ORDER — ALBUTEROL SULFATE (2.5 MG/3ML) 0.083% IN NEBU: 3.0000 mL | INHALATION_SOLUTION | Freq: Four times a day (QID) | RESPIRATORY_TRACT | Status: DC | PRN

## 2017-09-16 MED ORDER — QUETIAPINE FUMARATE 25 MG PO TABS
50.0000 mg | ORAL_TABLET | Freq: Three times a day (TID) | ORAL | Status: DC
  Administered 2017-09-16 – 2017-09-18 (×6): 50 mg via ORAL
  Filled 2017-09-16 (×6): qty 2

## 2017-09-16 MED ORDER — TIZANIDINE HCL 4 MG PO TABS
12.0000 mg | ORAL_TABLET | Freq: Every day | ORAL | Status: DC
  Administered 2017-09-16 – 2017-09-17 (×2): 12 mg via ORAL
  Filled 2017-09-16 (×4): qty 3

## 2017-09-16 MED ORDER — LORAZEPAM 2 MG/ML IJ SOLN
INTRAMUSCULAR | Status: AC
  Filled 2017-09-16: qty 1

## 2017-09-16 MED ORDER — SENNOSIDES-DOCUSATE SODIUM 8.6-50 MG PO TABS: 1.0000 | ORAL_TABLET | Freq: Every evening | ORAL | Status: DC | PRN

## 2017-09-16 MED ORDER — LORAZEPAM 2 MG/ML IJ SOLN
1.0000 mg | Freq: Once | INTRAMUSCULAR | Status: AC
  Administered 2017-09-16: 23:00:00 1 mg via INTRAVENOUS

## 2017-09-16 MED ORDER — LEVOTHYROXINE SODIUM 50 MCG PO TABS
175.0000 ug | ORAL_TABLET | Freq: Every day | ORAL | Status: DC
  Administered 2017-09-17 – 2017-09-18 (×2): 175 ug via ORAL
  Filled 2017-09-16 (×2): qty 1

## 2017-09-16 MED ORDER — SODIUM CHLORIDE 0.9 % IV SOLN: 250.0000 mL | INTRAVENOUS | Status: DC | PRN

## 2017-09-16 MED ORDER — SODIUM CHLORIDE 0.9% FLUSH
3.0000 mL | Freq: Two times a day (BID) | INTRAVENOUS | Status: DC
  Administered 2017-09-16 – 2017-09-18 (×4): 3 mL via INTRAVENOUS

## 2017-09-16 MED ORDER — SODIUM CHLORIDE 0.9% FLUSH: 3.0000 mL | INTRAVENOUS | Status: DC | PRN

## 2017-09-16 MED ORDER — QUETIAPINE FUMARATE 25 MG PO TABS
200.0000 mg | ORAL_TABLET | Freq: Every day | ORAL | Status: DC
  Administered 2017-09-16 – 2017-09-17 (×2): 200 mg via ORAL
  Filled 2017-09-16 (×2): qty 8

## 2017-09-16 MED ORDER — ACETAMINOPHEN 325 MG PO TABS: 650.0000 mg | ORAL_TABLET | Freq: Four times a day (QID) | ORAL | Status: DC | PRN

## 2017-09-16 MED ORDER — HEPARIN SODIUM (PORCINE) 5000 UNIT/ML IJ SOLN
5000.0000 [IU] | Freq: Three times a day (TID) | INTRAMUSCULAR | Status: DC
Start: 2017-09-16 — End: 2017-09-18
  Administered 2017-09-16 – 2017-09-18 (×4): 5000 [IU] via SUBCUTANEOUS
  Filled 2017-09-16 (×4): qty 1

## 2017-09-16 MED ORDER — VENLAFAXINE HCL ER 75 MG PO CP24
150.0000 mg | ORAL_CAPSULE | Freq: Every day | ORAL | Status: DC
  Administered 2017-09-17 – 2017-09-18 (×2): 150 mg via ORAL
  Filled 2017-09-16 (×2): qty 2

## 2017-09-16 MED ORDER — ACETAMINOPHEN 650 MG RE SUPP: 650.0000 mg | Freq: Four times a day (QID) | RECTAL | Status: DC | PRN

## 2017-09-16 MED ORDER — DIAZEPAM 5 MG PO TABS
5.0000 mg | ORAL_TABLET | Freq: Once | ORAL | Status: AC
  Administered 2017-09-16: 5 mg via ORAL
  Filled 2017-09-16: qty 1

## 2017-09-16 MED ORDER — HYDROCODONE-ACETAMINOPHEN 5-325 MG PO TABS
1.0000 | ORAL_TABLET | ORAL | Status: DC | PRN
  Administered 2017-09-16 – 2017-09-17 (×3): 2 via ORAL
  Administered 2017-09-17: 1 via ORAL
  Administered 2017-09-17 – 2017-09-18 (×4): 2 via ORAL
  Filled 2017-09-16 (×8): qty 2

## 2017-09-16 MED ORDER — BISACODYL 5 MG PO TBEC
5.0000 mg | DELAYED_RELEASE_TABLET | Freq: Every day | ORAL | Status: DC | PRN
  Administered 2017-09-18: 09:00:00 5 mg via ORAL
  Filled 2017-09-16: qty 1

## 2017-09-16 MED ORDER — HYDROXYZINE HCL 50 MG PO TABS
50.0000 mg | ORAL_TABLET | Freq: Four times a day (QID) | ORAL | Status: DC | PRN
  Administered 2017-09-16 – 2017-09-18 (×3): 50 mg via ORAL
  Filled 2017-09-16 (×3): qty 1

## 2017-09-16 MED ORDER — ONDANSETRON HCL 4 MG PO TABS
4.0000 mg | ORAL_TABLET | Freq: Four times a day (QID) | ORAL | Status: DC | PRN
Start: 2017-09-16 — End: 2017-09-18

## 2017-09-16 MED ORDER — ONDANSETRON HCL 4 MG/2ML IJ SOLN: 4.0000 mg | Freq: Four times a day (QID) | INTRAMUSCULAR | Status: DC | PRN

## 2017-09-16 MED ORDER — DOCUSATE SODIUM 100 MG PO CAPS
100.0000 mg | ORAL_CAPSULE | Freq: Two times a day (BID) | ORAL | Status: DC
  Administered 2017-09-16 – 2017-09-18 (×4): 100 mg via ORAL
  Filled 2017-09-16 (×4): qty 1

## 2017-09-16 MED ORDER — ONDANSETRON HCL 4 MG/2ML IJ SOLN
4.0000 mg | Freq: Once | INTRAMUSCULAR | Status: AC
  Administered 2017-09-16: 4 mg via INTRAVENOUS
  Filled 2017-09-16: qty 2

## 2017-09-16 NOTE — H&P (Signed)
[facility] NAME: Jenna Tyler    MR#:  [id number]  DATE OF BIRTH:  06-23-1965  DATE OF ADMISSION:  09/16/2017  PRIMARY CARE PHYSICIAN: [facility]   REQUESTING/REFERRING PHYSICIAN:   CHIEF COMPLAINT:   Chief Complaint  Patient presents with  . Weakness    Guillain barre flare up    HISTORY OF PRESENT ILLNESS: Jenna Tyler  is a 52 y.o. 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, presents today with 5-day history of worsening bilateral lower extremity weakness, became nonambulatory on Thursday-3 days ago, also complaining of arm weakness now, difficulty with urination, problems walking, jerking in her legs, in the emergency room TSH was 14.8, rest of her work-up was unimpressive, ED attending did discuss case with neurology-neurology stated patient can go home with patient was able to ambulate, hospitalist called as patient is unable to ambulate, patient evaluated at the bedside, no apparent distress, patient is now been admitted for acute quadriparesis and acute immobility.  PAST MEDICAL HISTORY:   Past Medical History:  Diagnosis Date  . Asthma   . Guillain Barr syndrome ([place])   . Hyperlipidemia   . Hypertension   . Hypothyroidism   . 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 Pancreatitis     PAST SURGICAL HISTORY:  Past Surgical History:  Procedure Laterality Date  . ABDOMINAL HYSTERECTOMY    . BREAST BIOPSY Right 2012   negative  . CHOLECYSTECTOMY    . 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 OTHER SURGICAL HISTORY     excision of lipoma  . OTHER SURGICAL HISTORY     Abdominal surgery  . THYROID SURGERY     thyroidectomy    SOCIAL HISTORY:   Social History   Tobacco Use  . Smoking status: Former Smoker    Last attempt to quit: 12/28/1997    Years since quitting: 19.7  . Smokeless tobacco: Never Used  Substance Use Topics  . Alcohol use: Not Currently    Alcohol/week: 0.0 oz    FAMILY HISTORY:  Family History  Problem Relation Age of Onset  . Breast cancer Sister 52  . Breast cancer Maternal Aunt 13  . Breast cancer Maternal Grandmother 60  . Breast cancer Maternal Aunt 40    DRUG ALLERGIES:  Allergies  Allergen Reactions  . Nsaids Other (See Comments)    Internal bleeding  Intestinal bleeding  . Adhesive [Tape] Rash  . Aspirin Other (See Comments)    Cannot take due to ulcers.  . Penicillins Rash    Has patient had a PCN reaction causing immediate rash, facial/tongue/throat swelling, SOB or lightheadedness with hypotension: Yes Has patient had a PCN reaction causing severe rash involving mucus membranes or skin necrosis: No Has patient had a PCN reaction that required hospitalization: No Has patient had a PCN reaction occurring within the last 10 years: Yes If all of the above answers are "NO", then may proceed with Cephalosporin use.     REVIEW OF SYSTEMS:   CONSTITUTIONAL: No fever, +fatigue ,weakness.  EYES: No blurred or double vision.  EARS, NOSE, AND THROAT: No tinnitus or ear pain.  RESPIRATORY: No cough, shortness of breath, wheezing or hemoptysis.  CARDIOVASCULAR: No chest pain, orthopnea, edema.  GASTROINTESTINAL: No  nausea, vomiting, diarrhea or abdominal pain.  GENITOURINARY: No dysuria, hematuria.  ENDOCRINE: No polyuria, nocturia,  HEMATOLOGY: No anemia, easy bruising or bleeding SKIN: No rash or lesion. MUSCULOSKELETAL: No joint pain or arthritis.   NEUROLOGIC: No tingling, numbness,+ upper and lower extremity bilateral weakness, unable to ambulate  PSYCHIATRY: No anxiety or depression.   MEDICATIONS AT HOME:  Prior to Admission medications   Medication Sig Start Date End Date  Taking? Authorizing Provider  albuterol (VENTOLIN HFA) 108 (90 Base) MCG/ACT inhaler Inhale 1-2 puffs into the lungs every 6 (six) hours as needed for wheezing or shortness of breath.    Yes [provider]  hydrOXYzine (VISTARIL) 50 MG capsule Take 50 mg by mouth every 6 (six) hours as needed for anxiety.    Yes [provider]  levothyroxine (SYNTHROID, LEVOTHROID) 150 MCG tablet Take 1 tablet (150 mcg total) by mouth daily. 07/16/17  Yes Sudini, Alveta Heimlich, MD  mometasone-formoterol (DULERA) 100-5 MCG/ACT AERO Inhale 2 puffs into the lungs 2 (two) times daily. 01/06/17  Yes Pucilowska, Jolanta B, MD  QUEtiapine (SEROQUEL) 200 MG tablet Take 1 tablet (200 mg total) by mouth at bedtime. 07/16/17  Yes Sudini, Alveta Heimlich, MD  QUEtiapine (SEROQUEL) 50 MG tablet Take 50 mg by mouth 3 (three) times daily.   Yes [provider]  tiZANidine (ZANAFLEX) 4 MG tablet Take 12 mg by mouth at bedtime.   Yes [provider]  venlafaxine XR (EFFEXOR-XR) 150 MG 24 hr capsule Take 1 capsule (150 mg total) by mouth daily with breakfast. 01/07/17  Yes Pucilowska, Jolanta B, MD      PHYSICAL EXAMINATION:   VITAL SIGNS: Blood pressure (!) 118/92, pulse 86, temperature 97.7 F (36.5 C), temperature source Oral, resp. rate 20, height 5\' 5"  (1.651 m), weight 78.9 kg (174 lb), SpO2 93 %.  GENERAL:  52 y.o.-year-old patient lying in the bed with no acute distress.  Obese EYES: Pupils equal, round, reactive to light and accommodation. No scleral icterus. Extraocular muscles intact.  HEENT: Head atraumatic, normocephalic. Oropharynx and nasopharynx clear.  NECK:  Supple, no jugular venous distention. No thyroid enlargement, no tenderness.  LUNGS: Normal breath sounds bilaterally, no wheezing, rales,rhonchi or crepitation. No use of accessory muscles of respiration.  CARDIOVASCULAR: S1, S2 normal. No murmurs, rubs, or gallops.  ABDOMEN: Soft, nontender, nondistended. Bowel sounds present. No  organomegaly or mass.  EXTREMITIES: No pedal edema, cyanosis, or clubbing.  NEUROLOGIC: Cranial nerves II through XII are intact. MAES. Sensation intact. Gait not checked.  Hip flexion bilaterally to/5, dorsiflexion/plantar flexion 3/5 PSYCHIATRIC: The patient is alert and oriented x 3.  SKIN: No obvious rash, lesion, or ulcer.   LABORATORY PANEL:   CBC Recent Labs  Lab 09/16/17 1536  WBC 7.5  HGB 15.0  HCT 43.5  PLT 263  MCV 81.4  MCH 28.0  MCHC 34.4  RDW 14.6*  LYMPHSABS 1.5  MONOABS 0.6  EOSABS 0.1  BASOSABS 0.1   ------------------------------------------------------------------------------------------------------------------  Chemistries  Recent Labs  Lab 09/16/17 1536  NA 135  K 3.8  CL 106  CO2 20*  GLUCOSE 152*  BUN 22*  CREATININE 1.16*  CALCIUM 9.2  AST 23  ALT 18  ALKPHOS 81  BILITOT 0.6   ------------------------------------------------------------------------------------------------------------------ estimated creatinine clearance is 59.6 mL/min (A) (by C-G formula based on SCr of 1.16 mg/dL (H)). ------------------------------------------------------------------------------------------------------------------ Recent Labs    09/16/17 1536  TSH 14.880*     Coagulation profile No results for input(s): INR, PROTIME in the last 168 hours. -------------------------------------------------------------------------------------------------------------------  No results for input(s): DDIMER in the last 72 hours. -------------------------------------------------------------------------------------------------------------------  Cardiac Enzymes Recent Labs  Lab 09/16/17 1536  TROPONINI <0.03   ------------------------------------------------------------------------------------------------------------------ Invalid input(s):  POCBNP  ---------------------------------------------------------------------------------------------------------------  Urinalysis    Component Value Date/Time   COLORURINE YELLOW (A) 07/11/2017 0032   APPEARANCEUR HAZY (A) 07/11/2017 0032   APPEARANCEUR Clear 03/28/2014 1540   LABSPEC 1.021 07/11/2017 0032   LABSPEC 1.021 03/28/2014 1540   PHURINE 5.0 07/11/2017 0032   GLUCOSEU NEGATIVE 07/11/2017 0032   GLUCOSEU Negative 03/28/2014 1540   HGBUR NEGATIVE 07/11/2017 0032   BILIRUBINUR NEGATIVE 07/11/2017 0032   BILIRUBINUR Negative 03/28/2014 1540   KETONESUR NEGATIVE 07/11/2017 0032   PROTEINUR NEGATIVE 07/11/2017 0032   NITRITE NEGATIVE 07/11/2017 0032   LEUKOCYTESUR NEGATIVE 07/11/2017 0032   LEUKOCYTESUR 1+ 03/28/2014 1540     RADIOLOGY: No results found.  EKG: Orders placed or performed during the hospital encounter of 09/16/17  . ED EKG  . ED EKG    IMPRESSION AND PLAN: *Acute on chronic bilateral upper/lower extremity weakness March 2019 work-up included MRI of the thoracic spine/lumbar spine/brain which were normal, currently being followed by neurology for presumed Guyon Barr syndrome Referred to the observation unit, check MRI of the C-spine given complaints of upper extremity weakness now, neurology to see, physical therapy to evaluate/treat, fall precautions, check CPK level, EMG/nerve conduction study to further valuation if able to be done in the hospital, and continue close medical monitoring  *Chronic hypothyroidism, unspecified TSH 14.8 Increase Synthroid to 175 mcg daily, recheck TSH in 4 to 6 weeks for reevaluation  *Chronic asthma without exacerbation Stable Breathing treatments as needed  *Chronic benign essential hypertension Stable Continue home regiment   All the records are reviewed and case discussed with ED provider. Management plans discussed with the patient, family and they are in agreement.  CODE STATUS:full Code Status History     Date Active Date Inactive Code Status Order ID Comments User Context   07/12/2017 0409 07/16/2017 2057 Full Code [id number]  Harrie Foreman, MD ED   12/28/2016 2201 01/07/2017 1822 Full Code [id number]  Gonzella Lex, MD Inpatient   12/28/2016 2201 12/28/2016 2201 Full Code [id number]  Gonzella Lex, MD Inpatient   12/27/2016 0251 12/28/2016 2128 Full Code [id number]  Clearnce Hasten, Randall An, MD ED       TOTAL TIME TAKING CARE OF THIS PATIENT: 45 minutes.    Avel Peace Salary M.D on 09/16/2017   Between 7am to 6pm - Pager - [phone]  After 6pm go to [email or web address] - password [email or web address] [facility]  Office  [phone]  CC: Primary care physician; [facility]   Note: This dictation was prepared with Dragon dictation along with smaller phrase technology. Any transcriptional errors that result from this process are unintentional.

## 2017-09-16 NOTE — ED Triage Notes (Signed)
Pt states she is having a flare up with her guillain barre, c/o weakness, balance issues and Bl LE pain since Friday. States she had a flare up about 2 months ago and was doing better until this past Friday.

## 2017-09-16 NOTE — ED Provider Notes (Signed)
[facility] Emergency Department Provider Note  Time seen: 5:21 PM  I have reviewed the triage vital signs and the nursing notes.   HISTORY  Chief Complaint Weakness (Guillain barre flare up)    HPI Jenna Tyler is a 52 y.o. female with a past medical history of hypertension, hyperlipidemia, possible Iline Oven, presents to the emergency department for lower extremity weakness pain and jerking.  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  Has been following up [facility] clinic neurology.  Patient states over the past for 5 days the weakness has gotten worse, she is having jerking of her extremities, and pain in both of her legs.  Patient denies any fever, largely negative review of systems.  Past Medical History:  Diagnosis Date  . Asthma   . Guillain Barr syndrome ([place])   . Hyperlipidemia   . Hypertension   . Hypothyroidism   . 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 Pancreatitis     Patient Active Problem List   Diagnosis Date Noted  . AKI (acute kidney injury) ([place]) 07/12/2017  . HTN (hypertension) 12/29/2016  . COPD exacerbation ([place]) 12/29/2016  . Substance induced mood disorder ([place]) 12/29/2016  . Alcohol use disorder, severe, dependence ([place]) 12/27/2016  . 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 Prediabetes 07/07/2015  . Primary hypothyroidism 04/04/2015  . Pre-diabetes 04/04/2015  . Vitamin D deficiency 04/04/2015  . Anxiety 10/12/2014  . Anxiety, generalized 10/12/2014  . H/O: hypothyroidism 10/12/2014  . Insomnia, persistent 10/12/2014  . Cannot sleep 10/12/2014  . Depression, major, recurrent, moderate ([place]) 10/12/2014  . Drug abuse, opioid type ([place]) 10/12/2014  . 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/2013    Past Surgical History:   Procedure Laterality Date  . ABDOMINAL HYSTERECTOMY    . BREAST BIOPSY Right 2012   negative  . CHOLECYSTECTOMY    . 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 OTHER SURGICAL HISTORY     excision of lipoma  . OTHER SURGICAL HISTORY     Abdominal surgery  . THYROID SURGERY     thyroidectomy    Prior to Admission medications   Medication Sig Start Date End Date Taking? Authorizing Provider  albuterol (VENTOLIN HFA) 108 (90 Base) MCG/ACT inhaler Inhale 1-2 puffs into the lungs every 6 (six) hours as needed.     [provider]  busPIRone (BUSPAR) 5 MG tablet Take 1 tablet (5 mg total) by mouth 3 (three) times daily. Patient not taking: Reported on 07/12/2017 01/07/17   Pucilowska, Herma Ard B, MD  hydrOXYzine (VISTARIL) 50 MG capsule Take 50 mg by mouth every 6 (six) hours as needed.    [provider]  levothyroxine (SYNTHROID, LEVOTHROID) 150 MCG tablet Take 1 tablet (150 mcg total) by mouth daily. 07/16/17   Hillary Bow, MD  mometasone-formoterol (DULERA) 100-5 MCG/ACT AERO Inhale 2 puffs into the lungs 2 (two) times daily. 01/06/17   Pucilowska, Jolanta B, MD  polyethylene glycol (MIRALAX / GLYCOLAX) packet Take 17 g by mouth daily. 01/06/17   Pucilowska, Jolanta B, MD  pravastatin (PRAVACHOL) 20 MG tablet Take 20 mg by mouth daily.    [provider]  prazosin (MINIPRESS) 2 MG capsule Take 1 capsule (2 mg total) by mouth daily with breakfast. 01/07/17   Pucilowska, Jolanta B, MD  QUEtiapine (SEROQUEL) 200 MG tablet Take 1 tablet (200  mg total) by mouth at bedtime. 07/16/17   Hillary Bow, MD  QUEtiapine (SEROQUEL) 50 MG tablet Take 50 mg by mouth 3 (three) times daily.    [provider]  tiZANidine (ZANAFLEX) 4 MG tablet Take 12 mg by mouth at bedtime.    [provider]  venlafaxine XR (EFFEXOR-XR) 150 MG 24 hr capsule Take 1 capsule (150 mg total) by mouth daily with breakfast. 01/07/17   Pucilowska, Jolanta B, MD  vitamin B-12 1000 MCG tablet  Take 1 tablet (1,000 mcg total) by mouth daily. 07/17/17   Hillary Bow, MD    Allergies  Allergen Reactions  . Nsaids Rash    Internal bleeding  Intestinal bleeding  . Adhesive [Tape] Rash  . Aspirin Rash  . Penicillins Rash    Family History  Problem Relation Age of Onset  . Breast cancer Sister 1  . Breast cancer Maternal Aunt 50  . Breast cancer Maternal Grandmother 60  . Breast cancer Maternal Aunt 77    Social History Social History   Tobacco Use  . Smoking status: Former Smoker    Last attempt to quit: 12/28/1997    Years since quitting: 19.7  . Smokeless tobacco: Never Used  Substance Use Topics  . Alcohol use: Not Currently    Alcohol/week: 0.0 oz  . Drug use: No    Review of Systems Constitutional: Negative for fever.   Eyes: Negative for visual complaints ENT: Negative for recent illness/congestion Cardiovascular: Negative for chest pain. Respiratory: Negative for shortness of breath. Gastrointestinal: Negative for abdominal pain, vomiting and diarrhea. Genitourinary: Negative for urinary compaints Musculoskeletal: Bilateral lower extremity pain.  Jerking of extremities.  Bilateral lower extremely weakness. Skin: Negative for skin complaints  Neurological: Negative for headache.  Positive for bilateral lower externally weakness. All other ROS negative  ____________________________________________   PHYSICAL EXAM:  VITAL SIGNS: ED Triage Vitals  Enc Vitals Group     BP 09/16/17 1528 124/82     Pulse Rate 09/16/17 1528 97     Resp 09/16/17 1528 17     Temp 09/16/17 1528 97.7 F (36.5 C)     Temp Source 09/16/17 1528 Oral     SpO2 09/16/17 1528 95 %     Weight 09/16/17 1529 174 lb (78.9 kg)     Height 09/16/17 1529 5\' 5"  (1.651 m)     Head Circumference --      Peak Flow --      Pain Score 09/16/17 1528 6     Pain Loc --      Pain Edu? --      Excl. in [place]? --    Constitutional: Alert and oriented. Well appearing and in no  distress. Eyes: Normal exam ENT   Head: Normocephalic and atraumatic.   Mouth/Throat: Mucous membranes are moist. Cardiovascular: Normal rate, regular rhythm. No murmur Respiratory: Normal respiratory effort without tachypnea nor retractions. Breath sounds are clear Gastrointestinal: Soft and nontender. No distention. Musculoskeletal: Nontender with normal range of motion in all extremities. Neurologic:  Normal speech and language.  Patient has 3/5 motor in bilateral lower extremities.  Sensation intact and equal.  5/5 motor in upper extremities.  No cranial nerve deficits. Skin:  Skin is warm, dry and intact.  Psychiatric: Mood and affect are normal. Speech and behavior are normal.   ____________________________________________    EKG  EKG reviewed and interpreted by myself shows normal sinus rhythm at 96 bpm, narrow QRS, normal axis, normal intervals, nonspecific but no concerning  ST changes.  ____________________________________________   INITIAL IMPRESSION / ASSESSMENT AND PLAN / ED COURSE  Pertinent labs & imaging results that were available during my care of the patient were reviewed by me and considered in my medical decision making (see chart for details).  Patient presents to the emergency department for weakness pain and jerking especially of her lower extremities.  Differential is quite broad but would include metabolic or electrolyte abnormality, neurologic abnormality, multiple sclerosis, possibly Guyon Aris Lot although seems less likely, CVA, lumbar pathology.  I reviewed the patient's records including notes from March in which she was seen for similar symptoms.  Has seen neurology since.  Has had MRIs performed of her brain thoracic and lumbar spine with no findings, had a essentially negative lumbar puncture and negative blood work.  Patient was started on Ultram/tramadol by neurology, but states she is out of those medications.  Was recommended for home physical  therapy by neurology but states she could not afford it so she has not done it.  Overall the patient appears well, no distress.  Is asking for pain medication for her legs.  Patient's lab work today is largely within normal limits besides a mildly elevated TSH however normal free T4 and the patient is on Synthroid.  We will dose pain medication.  We will discuss with neurology for any further recommendations.  Patient's work-up is largely negative here.  I discussed with neurology.  However as the patient states she still cannot ambulate, cannot lift her legs more than 1 or 2 inches off the bed we will discuss with the hospitalist for admission to the hospital and possible inpatient neurological consultation tomorrow. ____________________________________________   FINAL CLINICAL IMPRESSION(S) / ED DIAGNOSES  Lower extremity weakness Lower extremity pain    Harvest Dark, MD 09/16/17 [phone]

## 2017-09-16 NOTE — ED Triage Notes (Signed)
FIRST NURSE NOTE-hx guillain barre syndrome.  Has now started having trouble walking, neurologist told pt to come to ED.  Unlabored. No dyspnea.

## 2017-09-16 NOTE — ED Notes (Signed)
Bladder scan 81ml.

## 2017-09-17 DIAGNOSIS — R531 Weakness: Secondary | ICD-10-CM

## 2017-09-17 LAB — URINALYSIS, COMPLETE (UACMP) WITH MICROSCOPIC
Bilirubin Urine: NEGATIVE
Glucose, UA: NEGATIVE mg/dL
Hgb urine dipstick: NEGATIVE
Ketones, ur: NEGATIVE mg/dL
Nitrite: NEGATIVE
Protein, ur: 30 mg/dL — AB
Specific Gravity, Urine: 1.023 (ref 1.005–1.030)
pH: 5 (ref 5.0–8.0)

## 2017-09-17 MED ORDER — DIAZEPAM 5 MG PO TABS
5.0000 mg | ORAL_TABLET | Freq: Once | ORAL | Status: AC
  Administered 2017-09-17: 14:00:00 5 mg via ORAL
  Filled 2017-09-17: qty 1

## 2017-09-17 NOTE — Progress Notes (Signed)
Nutrition Brief Note  Patient identified on the Malnutrition Screening Tool (MST) Report. Spoke with pt at bedside who reports having an "okay" appetite and typically eating 2 meals daily. Lunch may include a sandwich of dinner leftovers. Dinner may include Cajun chicken pasta or homemade salisbury steak. Pt reports her UBW as 170-190 lbs. Per weight history in chart, pt's weight has fluctuated between 173 lbs and 190 lbs since December 2016.  Nutrition-focus physical exam completed. No evidence of muscle or subcutaneous fat depletion.  Pt requested to have her diet order liberated from Heart Healthy to Regular. RD spoke directly with MD who approved. RD put order in to liberate pt's diet order.  Wt Readings from Last 15 Encounters:  09/16/17 182 lb 1.6 oz (82.6 kg)  07/26/17 190 lb (86.2 kg)  07/16/17 190 lb 9.6 oz (86.5 kg)  12/26/16 174 lb (78.9 kg)  08/06/16 178 lb (80.7 kg)  02/14/16 173 lb (78.5 kg)  07/07/15 177 lb (80.3 kg)  04/04/15 183 lb (83 kg)    Body mass index is 30.3 kg/m. Patient meets criteria for Obesity Class I based on current BMI.   Current diet order is Regular, patient is consuming approximately 50% of meals at this time (pt does not like heart-healthy diet options). Labs and medications reviewed.   No nutrition interventions warranted at this time. If nutrition issues arise, please consult RD.   Gaynell Face, MS, RD, LDN Pager: [phone] Weekend/After Hours: [phone]

## 2017-09-17 NOTE — Progress Notes (Signed)
Report from ED said pt couldn't walk.  Pt states this was true, however, she wanted to walk to the bathroom with assistance.  Her gait was quite steady, and she even bent over to remove her pants and socks.   Social work consult entered d/t lack of financial resources for meds and no transportation.

## 2017-09-17 NOTE — Progress Notes (Signed)
[facility] NAME: Jenna Tyler    MR#:  [id number]  DATE OF BIRTH:  [birth year]  SUBJECTIVE:  CHIEF COMPLAINT:   Chief Complaint  Patient presents with  . Weakness    Guillain barre flare up  weak LEs, requesting rehab REVIEW OF SYSTEMS:  Review of Systems  Constitutional: Negative for chills, fever and weight loss.  HENT: Negative for nosebleeds and sore throat.   Eyes: Negative for blurred vision.  Respiratory: Negative for cough, shortness of breath and wheezing.   Cardiovascular: Negative for chest pain, orthopnea, leg swelling and PND.  Gastrointestinal: Negative for abdominal pain, constipation, diarrhea, heartburn, nausea and vomiting.  Genitourinary: Negative for dysuria and urgency.  Musculoskeletal: Negative for back pain.  Skin: Negative for rash.  Neurological: Negative for dizziness, speech change, focal weakness and headaches.  Endo/Heme/Allergies: Does not bruise/bleed easily.  Psychiatric/Behavioral: Negative for depression.    DRUG ALLERGIES:   Allergies  Allergen Reactions  . Nsaids Other (See Comments)    Internal bleeding  Intestinal bleeding  . Adhesive [Tape] Rash  . Aspirin Other (See Comments)    Cannot take due to ulcers.  . Penicillins Rash    Has patient had a PCN reaction causing immediate rash, facial/tongue/throat swelling, SOB or lightheadedness with hypotension: Yes Has patient had a PCN reaction causing severe rash involving mucus membranes or skin necrosis: No Has patient had a PCN reaction that required hospitalization: No Has patient had a PCN reaction occurring within the last 10 years: Yes If all of the above answers are "NO", then may proceed with Cephalosporin use.    VITALS:  Blood pressure 131/79, pulse 80, temperature 98 F (36.7 C), temperature source Oral, resp. rate 20, height 5\' 5"  (1.651 m), weight 82.6 kg (182 lb 1.6 oz), SpO2 93 %. PHYSICAL EXAMINATION:    Physical Exam  Constitutional: She is oriented to person, place, and time.  HENT:  Head: Normocephalic and atraumatic.  Eyes: Pupils are equal, round, and reactive to light. Conjunctivae and EOM are normal.  Neck: Normal range of motion. Neck supple. No tracheal deviation present. No thyromegaly present.  Cardiovascular: Normal rate, regular rhythm and normal heart sounds.  Pulmonary/Chest: Effort normal and breath sounds normal. No respiratory distress. She has no wheezes. She exhibits no tenderness.  Abdominal: Soft. Bowel sounds are normal. She exhibits no distension. There is no tenderness.  Musculoskeletal: Normal range of motion.  Neurological: She is alert and oriented to person, place, and time. No cranial nerve deficit.  Skin: Skin is warm and dry. No rash noted.   LABORATORY PANEL:  Female CBC Recent Labs  Lab 09/16/17 1536  WBC 7.5  HGB 15.0  HCT 43.5  PLT 263   ------------------------------------------------------------------------------------------------------------------ Chemistries  Recent Labs  Lab 09/16/17 1536  NA 135  K 3.8  CL 106  CO2 20*  GLUCOSE 152*  BUN 22*  CREATININE 1.16*  CALCIUM 9.2  AST 23  ALT 18  ALKPHOS 81  BILITOT 0.6   RADIOLOGY:  Mr Cervical Spine Wo Contrast  Result Date: 09/17/2017 CLINICAL DATA:  Worsening lower extremity weakness. History of Guillain-Barre syndrome. Upper extremity weakness and difficulty urinating. EXAM: MRI CERVICAL SPINE WITHOUT CONTRAST TECHNIQUE: Multiplanar, multisequence MR imaging of the cervical spine was performed. No intravenous contrast was administered. COMPARISON:  None. FINDINGS: Alignment: Normal. Vertebrae: No focal marrow lesion. No compression fracture or evidence of discitis osteomyelitis. Cord: Normal caliber and signal. Posterior Fossa, vertebral arteries, paraspinal  tissues: Visualized posterior fossa is normal. Vertebral artery flow voids are preserved. No prevertebral effusion. Disc  levels: The C1-2 articulation is normal. The C2-C5 levels are normal. C5-C6: Small central disc protrusion effaces the thecal sac ventrally and indents the anterior spinal cord. Mild spinal canal stenosis. No neural foraminal stenosis. C6-C7: Small central/right subarticular disc protrusion indents the ventral spinal cord. Mild spinal canal stenosis. No neural foraminal stenosis. C7-T1: Normal. T1-T2 is imaged in the sagittal plane only, with no visible disc herniation or stenosis. IMPRESSION: Small central disc protrusions at C5-6 and C6-7 indenting the ventral aspect of the spinal cord and causing mild spinal canal stenosis. No cord signal change. Electronically Signed   By: Ulyses Jarred M.D.   On: 09/17/2017 00:43   ASSESSMENT AND PLAN:   *Acute on chronic bilateral upper/lower extremity weakness March 2019 work-up included MRI of the thoracic spine/lumbar spine/brain which were normal Referred to the observation unit, check MRI of the C-spine given complaints of upper extremity weakness now, neurology doesn't recommend any further work up - don't think it's GBS. Recommends outpt Neuro f/up with Dr Melrose Nakayama - PT recommends STR  *Chronic hypothyroidism, unspecified TSH 14.8 Increased Synthroid to 175 mcg daily, recheck TSH in 4 to 6 weeks for reevaluation  *Chronic asthma without exacerbation Stable Breathing treatments as needed  *Chronic benign essential hypertension Stable Continue home regiment       All the records are reviewed and case discussed with Care Management/Social Worker. Management plans discussed with the patient, nursing and they are in agreement.  CODE STATUS: Full Code  TOTAL TIME TAKING CARE OF THIS PATIENT: 35 minutes.   More than 50% of the time was spent in counseling/coordination of care: YES  POSSIBLE D/C IN 1 DAYS, DEPENDING ON CLINICAL CONDITION.   Max Sane M.D on 09/17/2017 at 7:39 PM  Between 7am to 6pm - Pager - [phone]  After 6pm go  to [email or web address] - Technical brewer [facility]  Office  [phone]  CC: Primary care physician; [facility]  Note: This dictation was prepared with Dragon dictation along with smaller phrase technology. Any transcriptional errors that result from this process are unintentional.

## 2017-09-17 NOTE — Clinical Social Work Note (Signed)
Clinical Social Work Assessment  Patient Details  Name: Jenna Tyler MRN: [id number] Date of Birth: Jun 14, 1965  Date of referral:  09/17/17               Reason for consult:  Facility Placement                Permission sought to share information with:  Case Manager, Customer service manager, Family Supports Permission granted to share information::  Yes, Verbal Permission Granted  Name::      SNF   Agency::   [facility]   Relationship::     Contact Information:     Housing/Transportation Living arrangements for the past 2 months:  Single Family Home Source of Information:  Patient Patient Interpreter Needed:  None Criminal Activity/Legal Involvement Pertinent to Current Situation/Hospitalization:  No - Comment as needed Significant Relationships:  Adult Children, Parents Lives with:  Parents Do you feel safe going back to the place where you live?  Yes Need for family participation in patient care:  No (Coment)  Care giving concerns:  Patient lives with her mother in [place] Worker assessment / plan:  CSW consulted for facility placement. PT notified CSW that patient will require SNF at discharge for rehab. CSW met with patient to discuss SNF placement. CSW explained role and need for SNF. Patient is in agreement with SNF placement. Patient states that she lives with her elderly mother and is currently going through a separation with her husband. Patient has 4 children, a set of twins that are 29 and live with her husband in [place]. She also has 2 other older children that are on their own and have jobs. Patient states that she has been at [facility] in the past and would like to return there if a bed is available. CSW explained the bed search process and insurance authorization. Patient is in agreement with bed search with first choice being [facility]. CSW initiated bed search and will give bed offers once received. CSW will continue to follow for  discharge needs.   Employment status:  Disabled (Comment on whether or not currently receiving Disability) Insurance information:  Managed Care PT Recommendations:  [place] / Referral to community resources:  [place]  Patient/Family's Response to care:  Patient is in agreement with plan and appreciative of assistance. Patient does not have a lot of family assistance   Patient/Family's Understanding of and Emotional Response to Diagnosis, Current Treatment, and Prognosis:  Family is not able to assist her physically but does try to support her in other ways. She is also going through a separation with her husband.   Emotional Assessment Appearance:  Appears stated age Attitude/Demeanor/Rapport:    Affect (typically observed):  Accepting, Pleasant Orientation:  Oriented to Self, Oriented to Place, Oriented to  Time, Oriented to Situation Alcohol / Substance use:  Not Applicable Psych involvement (Current and /or in the community):  No (Comment)  Discharge Needs  Concerns to be addressed:  Discharge Planning Concerns Readmission within the last 30 days:  No Current discharge risk:  None Barriers to Discharge:  No Barriers Identified   Sissy Hoff 09/17/2017, 12:09 PM

## 2017-09-17 NOTE — Clinical Social Work Note (Signed)
CSW met with patient to review bed offers. Patient would like to accept bed offer from [facility]. CSW notified Jenna Tyler, liaison for [facility] that patient has accepted bed offer. Jenna Tyler will work on Jenna Tyler. CSW will continue to follow for discharge needs.   [place], [place]

## 2017-09-17 NOTE — Evaluation (Signed)
Physical Therapy Evaluation Patient Details Name: Jenna Tyler MRN: [id number] DOB: 09-17-1965 Today's Date: 09/17/2017   History of Present Illness  Patient is a 52 year old female admitted for generalized weakness s/p a dx of Guillain-Barre.  PMH includes pancreatitis, neurofibromatosis, hypothyroidism, Htn, HLD and asthma.  Clinical Impression  Pt is a 52 year old female who lives in a one story home with her mother.  She has recently been DC'd from SNF and was using a SPC for mobility and reports that her strength gradually declined over the course of a two day period.  Pt in bed and reports no pain upon PT arrival.  She is able to perform bed mobility mod I and sit at EOB with supervision.  Pt presents with a visible resting tremor and 3-3+/5 strength of UE and LE bilaterally.  She reports no sensation loss or N/T.  Pt required multiple attempts and min A to stand with RW and appears unsteady on feet with quiet stance.  Tremor increases with mobility.  Pt unsafe to step away from bed and presents with greater difficulty with anterior/posterior gait as opposed to lateral stepping.  Able to step along EOB with min-mod A for stability and management of RW.  Pt able to perform standing pivot to chair with min A.  Balance is overall poor and coordination testing reveals decreased coordination and dysmetria.  Pt has concern regarding caregiver as her mother is elderly and unable to assist her physically.  She is a high fall risk and will benefit from continuation of skilled PT with focus on strength, balance and fall prevention, safe use of RW, functional mobility, coordination.    Follow Up Recommendations SNF    Equipment Recommendations  None recommended by PT    Recommendations for Other Services       Precautions / Restrictions Precautions Precautions: Fall Precaution Comments: High Fall Risk Restrictions Weight Bearing Restrictions: No      Mobility  Bed Mobility Overal bed  mobility: Modified Independent             General bed mobility comments: HOB elevated with increased time to get to EOB.    Transfers Overall transfer level: Needs assistance Equipment used: Rolling walker (2 wheeled) Transfers: Sit to/from Stand Sit to Stand: Mod assist         General transfer comment: Assistance to initiate STS and avoid posterior LOB.  Multiple attempts required and pt stands slowly, LE tremor increasing throughout process.  Ambulation/Gait Ambulation/Gait assistance: Mod assist Ambulation Distance (Feet): 10 Feet Assistive device: Rolling walker (2 wheeled)     Gait velocity interpretation: <1.31 ft/sec, indicative of household ambulator General Gait Details: Very low foot clearance, difficulty initiating anterior/posterior stepping, able to sidestep slowly along EOB and perform standing pivot transfer with assistance for management of RW.    Stairs            Wheelchair Mobility    Modified Rankin (Stroke Patients Only)       Balance Overall balance assessment: Needs assistance Sitting-balance support: Bilateral upper extremity supported;Feet supported Sitting balance-Leahy Scale: Fair     Standing balance support: Bilateral upper extremity supported Standing balance-Leahy Scale: Poor                               Pertinent Vitals/Pain Pain Assessment: No/denies pain    Home Living Family/patient expects to be discharged to:: Skilled nursing facility Living Arrangements: Parent  Additional Comments: Not enough room in the house for a WC.  Two steps to get into home.    Prior Function Level of Independence: Independent with assistive device(s)               Hand Dominance        Extremity/Trunk Assessment   Upper Extremity Assessment Upper Extremity Assessment: Generalized weakness(Grossly 3+/5 with no report of sensation loss.  Visible resting tremor.)    Lower Extremity  Assessment Lower Extremity Assessment: Generalized weakness;Defer to PT evaluation(Grossly 3+/5 bilaterally, visible resting tremor-increases with WB.)    Cervical / Trunk Assessment Cervical / Trunk Assessment: Normal  Communication   Communication: No difficulties  Cognition Arousal/Alertness: Awake/alert Behavior During Therapy: WFL for tasks assessed/performed Overall Cognitive Status: Within Functional Limits for tasks assessed                                 General Comments: A&O x4, follows commands consistently.      General Comments General comments (skin integrity, edema, etc.): Coordination: finger to nose: L: 13 sec with dysmetria, R: 11 sec with dysmetria    Exercises     Assessment/Plan    PT Assessment Patient needs continued PT services  PT Problem List Decreased strength;Decreased mobility;Decreased activity tolerance;Decreased cognition;Decreased balance;Decreased knowledge of use of DME;Decreased knowledge of precautions;Decreased coordination       PT Treatment Interventions DME instruction;Therapeutic activities;Gait training;Therapeutic exercise;Patient/family education;Stair training;Balance training;Functional mobility training;Neuromuscular re-education    PT Goals (Current goals can be found in the Care Plan section)  Acute Rehab PT Goals Patient Stated Goal: To eventually return home and back to walking with a SPC. PT Goal Formulation: With patient Time For Goal Achievement: 10/01/17 Potential to Achieve Goals: Good    Frequency Min 2X/week   Barriers to discharge Decreased caregiver support;Inaccessible home environment Experiencing difficulty with initiation of swing phase during anterior and posterior gait which will result in difficulty in accessing home via steps.  Pt's mother is 47+ years of age and unable to physically assist pt.    Co-evaluation               AM-PAC PT "6 Clicks" Daily Activity  Outcome Measure  Difficulty turning over in bed (including adjusting bedclothes, sheets and blankets)?: A Little Difficulty moving from lying on back to sitting on the side of the bed? : A Lot Difficulty sitting down on and standing up from a chair with arms (e.g., wheelchair, bedside commode, etc,.)?: A Lot Help needed moving to and from a bed to chair (including a wheelchair)?: A Lot Help needed walking in hospital room?: A Lot Help needed climbing 3-5 steps with a railing? : A Lot 6 Click Score: 13    End of Session Equipment Utilized During Treatment: Gait belt Activity Tolerance: Patient limited by fatigue Patient left: in chair;with chair alarm set;with call bell/phone within reach(Physician in room) Nurse Communication: Mobility status(Communicated with physician and social work.) PT Visit Diagnosis: Unsteadiness on feet (R26.81);Muscle weakness (generalized) (M62.81)    Time: 1050-1120 PT Time Calculation (min) (ACUTE ONLY): 30 min   Charges:   PT Evaluation $PT Eval Moderate Complexity: 1 Mod     PT G Codes:   PT G-Codes **NOT FOR INPATIENT CLASS** Functional Assessment Tool Used: AM-PAC 6 Clicks Basic Mobility   Roxanne Gates, PT, DPT   Roxanne Gates 09/17/2017, 11:35 AM

## 2017-09-17 NOTE — NC FL2 (Signed)
[place] LEVEL OF CARE SCREENING TOOL     IDENTIFICATION  Patient Name: Jenna Tyler Birthdate: 1966/03/06 Sex: female Admission Date (Current Location): 09/16/2017  [place] and [place] Number:  Engineering geologist and Address:  [facility], [address] [place], [place] [address]      Provider Number: [id number]  Attending Physician Name and Address:  Max Sane, MD  Relative Name and Phone Number:       Current Level of Care: Hospital Recommended Level of Care: [place] Prior Approval Number:    Date Approved/Denied:   PASRR Number: [id number]  Discharge Plan: SNF    Current Diagnoses: Patient Active Problem List   Diagnosis Date Noted  . Weakness 09/16/2017  . AKI (acute kidney injury) ([place]) 07/12/2017  . HTN (hypertension) 12/29/2016  . COPD exacerbation ([place]) 12/29/2016  . Substance induced mood disorder ([place]) 12/29/2016  . Alcohol use disorder, severe, dependence ([place]) 12/27/2016  . Major depressive disorder, recurrent severe without psychotic features ([place]) 12/27/2016  . Prediabetes 07/07/2015  . Primary hypothyroidism 04/04/2015  . Pre-diabetes 04/04/2015  . Vitamin D deficiency 04/04/2015  . Anxiety 10/12/2014  . Anxiety, generalized 10/12/2014  . H/O: hypothyroidism 10/12/2014  . Insomnia, persistent 10/12/2014  . Cannot sleep 10/12/2014  . Depression, major, recurrent, moderate ([place]) 10/12/2014  . Drug abuse, opioid type ([place]) 10/12/2014  . 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/2013    Orientation RESPIRATION BLADDER Height & Weight     Self, Time, Situation, Place  Normal Continent Weight: 182 lb 1.6 oz (82.6 kg) Height:  5\' 5"  (165.1 cm)  BEHAVIORAL SYMPTOMS/MOOD NEUROLOGICAL BOWEL NUTRITION STATUS  (None) (none) Continent Diet(Heart Healthy )  AMBULATORY STATUS COMMUNICATION OF NEEDS Skin   Extensive Assist Verbally Normal                        Personal Care Assistance Level of Assistance  Feeding, Dressing, Bathing Bathing Assistance: Limited assistance Feeding assistance: Independent Dressing Assistance: Limited assistance     Functional Limitations Info  Sight, Hearing, Speech Sight Info: Adequate Hearing Info: Adequate Speech Info: Adequate    SPECIAL CARE FACTORS FREQUENCY  PT (By licensed PT), OT (By licensed OT)     PT Frequency: 5x/week OT Frequency: 5x/week            Contractures Contractures Info: Not present    Additional Factors Info  Code Status, Allergies Code Status Info: Full Code  Allergies Info: Nsaids, Penicillin, Adhesive Tape, Aspirin            Current Medications (09/17/2017):  This is the current hospital active medication list Current Facility-Administered Medications  Medication Dose Route Frequency Provider Last Rate Last Dose  . 0.9 %  sodium chloride infusion  250 mL Intravenous PRN Salary, Montell D, MD      . acetaminophen (TYLENOL) tablet 650 mg  650 mg Oral Q6H PRN Salary, Montell D, MD       Or  . acetaminophen (TYLENOL) suppository 650 mg  650 mg Rectal Q6H PRN Salary, Montell D, MD      . albuterol (PROVENTIL) (2.5 MG/3ML) 0.083% nebulizer solution 2.5 mg  2.5 mg Nebulization Q6H PRN Salary, Montell D, MD      . albuterol (PROVENTIL) (2.5 MG/3ML) 0.083% nebulizer solution 3 mL  3 mL Inhalation Q6H PRN Salary, Montell D, MD      . bisacodyl (DULCOLAX) EC tablet 5 mg  5 mg Oral Daily PRN Salary, Montell D, MD      . docusate sodium (COLACE) capsule 100 mg  100 mg Oral BID Salary, Montell D, MD   100 mg at 09/17/17 0906  . heparin injection 5,000 Units  5,000 Units Subcutaneous Q8H Salary, Montell D, MD   5,000 Units at 09/17/17 0450  . HYDROcodone-acetaminophen (NORCO/VICODIN) 5-325 MG per tablet 1-2 tablet  1-2 tablet Oral Q4H PRN Salary, Holly Bodily D, MD   2 tablet at 09/17/17 0906  . hydrOXYzine (ATARAX/VISTARIL) tablet 50 mg  50 mg Oral Q6H PRN  Loney Hering D, MD   50 mg at 09/16/17 2207  . levothyroxine (SYNTHROID, LEVOTHROID) tablet 175 mcg  175 mcg Oral QAC breakfast Loney Hering D, MD   175 mcg at 09/17/17 0906  . mometasone-formoterol (DULERA) 100-5 MCG/ACT inhaler 2 puff  2 puff Inhalation BID Loney Hering D, MD   2 puff at 09/17/17 0910  . ondansetron (ZOFRAN) tablet 4 mg  4 mg Oral Q6H PRN Salary, Montell D, MD       Or  . ondansetron (ZOFRAN) injection 4 mg  4 mg Intravenous Q6H PRN Salary, Montell D, MD      . QUEtiapine (SEROQUEL) tablet 200 mg  200 mg Oral QHS Salary, Montell D, MD   200 mg at 09/16/17 2205  . QUEtiapine (SEROQUEL) tablet 50 mg  50 mg Oral TID Loney Hering D, MD   50 mg at 09/17/17 0906  . senna-docusate (Senokot-S) tablet 1 tablet  1 tablet Oral QHS PRN Salary, Montell D, MD      . sodium chloride flush (NS) 0.9 % injection 3 mL  3 mL Intravenous Q12H Salary, Montell D, MD   3 mL at 09/17/17 0910  . sodium chloride flush (NS) 0.9 % injection 3 mL  3 mL Intravenous PRN Salary, Montell D, MD      . tiZANidine (ZANAFLEX) tablet 12 mg  12 mg Oral QHS Salary, Montell D, MD   12 mg at 09/16/17 2206  . venlafaxine XR (EFFEXOR-XR) 24 hr capsule 150 mg  150 mg Oral Q breakfast Salary, Montell D, MD   150 mg at 09/17/17 9675     Discharge Medications: Please see discharge summary for a list of discharge medications.  Relevant Imaging Results:  Relevant Lab Results:   Additional Information SSN [id number]  Annamaria Boots, [place]

## 2017-09-17 NOTE — Consult Note (Signed)
Reason for Consult:Inability to walk Referring Physician: Manuella Ghazi  CC: Inability to walk  HPI: Jenna Tyler is an 52 y.o. female admitted to the hospital in March with LE weakness.  Work up at that time was unremarkable and included a MRI of the brain, cervical spine, thoracic spine and lumbar spine.  Patient was followed up on an outpatient basis.  Work up outpatient included an LP that was unremarkable and a NCV/EMG.  I do not see that result today but per report of patient it was normal.  Patient presented to the ED on yesterday reporting that her lower extremity weakness had been getting worse for the past 5 days.  On the 23rd became nonambulatory.  Also complained of weakness in her upper extremities (which she now denies), extremity jerking and difficulty urinating.   Although by report of patient she is nonambulatory patient able to walk with nursing to the bathroom and able to bend over to remove her pants and socks.    Past Medical History:  Diagnosis Date  . Asthma   . Guillain Barr syndrome ([place])   . Hyperlipidemia   . Hypertension   . Hypothyroidism   . 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 Pancreatitis     Past Surgical History:  Procedure Laterality Date  . ABDOMINAL HYSTERECTOMY    . BREAST BIOPSY Right 2012   negative  . CHOLECYSTECTOMY    . 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 OTHER SURGICAL HISTORY     excision of lipoma  . OTHER SURGICAL HISTORY     Abdominal surgery  . THYROID SURGERY     thyroidectomy    Family History  Problem Relation Age of Onset  . Breast cancer Sister 47  . Breast cancer Maternal Aunt 38  . Breast cancer Maternal Grandmother 60  . Breast cancer Maternal Aunt 99    Social History:  reports that she quit smoking about 19 years ago. She has never used smokeless tobacco. She reports that she drank alcohol. She reports that she does not use drugs.  Allergies  Allergen Reactions  . Nsaids Other (See Comments)     Internal bleeding  Intestinal bleeding  . Adhesive [Tape] Rash  . Aspirin Other (See Comments)    Cannot take due to ulcers.  . Penicillins Rash    Has patient had a PCN reaction causing immediate rash, facial/tongue/throat swelling, SOB or lightheadedness with hypotension: Yes Has patient had a PCN reaction causing severe rash involving mucus membranes or skin necrosis: No Has patient had a PCN reaction that required hospitalization: No Has patient had a PCN reaction occurring within the last 10 years: Yes If all of the above answers are "NO", then may proceed with Cephalosporin use.     Medications:  I have reviewed the patient's current medications. Prior to Admission:  Medications Prior to Admission  Medication Sig Dispense Refill Last Dose  . albuterol (VENTOLIN HFA) 108 (90 Base) MCG/ACT inhaler Inhale 1-2 puffs into the lungs every 6 (six) hours as needed for wheezing or shortness of breath.    PRN at PRN  . hydrOXYzine (VISTARIL) 50 MG capsule Take 50 mg by mouth every 6 (six) hours as needed for anxiety.    PRN at PRN  . levothyroxine (SYNTHROID, LEVOTHROID) 150 MCG tablet Take 1 tablet (150 mcg total) by mouth daily.   09/16/2017 at 0830  . mometasone-formoterol (DULERA) 100-5 MCG/ACT AERO Inhale 2 puffs into the lungs 2 (two) times daily. 1 Inhaler 1  09/16/2017 at 0900  . QUEtiapine (SEROQUEL) 200 MG tablet Take 1 tablet (200 mg total) by mouth at bedtime.   09/15/2017 at 2000  . QUEtiapine (SEROQUEL) 50 MG tablet Take 50 mg by mouth 3 (three) times daily.   09/16/2017 at 0900  . tiZANidine (ZANAFLEX) 4 MG tablet Take 12 mg by mouth at bedtime.   09/15/2017 at 2000  . venlafaxine XR (EFFEXOR-XR) 150 MG 24 hr capsule Take 1 capsule (150 mg total) by mouth daily with breakfast. 30 capsule 1 09/16/2017 at 0900   Scheduled: . docusate sodium  100 mg Oral BID  . heparin  5,000 Units Subcutaneous Q8H  . levothyroxine  175 mcg Oral QAC breakfast  . mometasone-formoterol  2 puff  Inhalation BID  . QUEtiapine  200 mg Oral QHS  . QUEtiapine  50 mg Oral TID  . sodium chloride flush  3 mL Intravenous Q12H  . tiZANidine  12 mg Oral QHS  . venlafaxine XR  150 mg Oral Q breakfast    ROS: History obtained from the patient  General ROS: negative for - chills, fatigue, fever, night sweats, weight gain or weight loss Psychological ROS: negative for - behavioral disorder, hallucinations, memory difficulties, mood swings or suicidal ideation Ophthalmic ROS: negative for - blurry vision, double vision, eye pain or loss of vision ENT ROS: negative for - epistaxis, nasal discharge, oral lesions, sore throat, tinnitus or vertigo Allergy and Immunology ROS: negative for - hives or itchy/watery eyes Hematological and Lymphatic ROS: negative for - bleeding problems, bruising or swollen lymph nodes Endocrine ROS: negative for - galactorrhea, hair pattern changes, polydipsia/polyuria or temperature intolerance Respiratory ROS: negative for - cough, hemoptysis, shortness of breath or wheezing Cardiovascular ROS: negative for - chest pain, dyspnea on exertion, edema or irregular heartbeat Gastrointestinal ROS: negative for - abdominal pain, diarrhea, hematemesis, nausea/vomiting or stool incontinence Genito-Urinary ROS:as noted in HPI Musculoskeletal ROS: negative for - joint swelling or muscular weakness Neurological ROS: as noted in HPI Dermatological ROS: negative for rash and skin lesion changes  Physical Examination: Blood pressure 120/88, pulse 72, temperature 97.7 F (36.5 C), temperature source Oral, resp. rate 18, height 5\' 5"  (1.651 m), weight 82.6 kg (182 lb 1.6 oz), SpO2 96 %.  HEENT-  Normocephalic, no lesions, without obvious abnormality.  Normal external eye and conjunctiva.  Normal TM's bilaterally.  Normal auditory canals and external ears. Normal external nose, mucus membranes and septum.  Normal pharynx. Cardiovascular- S1, S2 normal, pulses palpable throughout    Lungs- chest clear, no wheezing, rales, normal symmetric air entry Abdomen- soft, non-tender; bowel sounds normal; no masses,  no organomegaly Extremities- no edema Lymph-no adenopathy palpable Musculoskeletal-no joint tenderness, deformity or swelling Skin-warm and dry, no hyperpigmentation, vitiligo, or suspicious lesions  Neurological Examination   Mental Status: Alert, oriented, thought content appropriate.  Speech fluent without evidence of aphasia.  Able to follow 3 step commands without difficulty. Cranial Nerves: II: Discs flat bilaterally; Visual fields grossly normal, pupils equal, round, reactive to light and accommodation III,IV, VI: ptosis not present, extra-ocular motions intact bilaterally V,VII: smile symmetric, facial light touch sensation normal bilaterally VIII: hearing normal bilaterally IX,X: gag reflex present XI: bilateral shoulder shrug XII: midline tongue extension Motor: Right : Upper extremity   5/5    Left:     Upper extremity   5/5 Patient startled when entering the room and lifted both legs off the bed.  When asked to ligt the legs voluntarily was unable to get either leg  off the bed.  Flexed at the knees bilaterally beyond 90 degrees strongly.  When leg supported gave 5/5 strength on flexion and extension at the knees bilaterally.  5/5 plantar extension and flexion.  When leg supported and support removed able to maintain legs against gravity.  Was pulling self up to head of bed with arms on my leaving the room. Sensory: Pinprick and light touch intact throughout, bilaterally Deep Tendon Reflexes: 3+ and symmetric throughout. No clonus Plantars: Right: downgoing   Left: downgoing Cerebellar: Normal finger-to-nose and normal heel-to-shin testing bilaterally Gait: not tested due to safety concerns   Laboratory Studies:   Basic Metabolic Panel: Recent Labs  Lab 09/16/17 1536  NA 135  K 3.8  CL 106  CO2 20*  GLUCOSE 152*  BUN 22*  CREATININE 1.16*   CALCIUM 9.2    Liver Function Tests: Recent Labs  Lab 09/16/17 1536  AST 23  ALT 18  ALKPHOS 81  BILITOT 0.6  PROT 7.0  ALBUMIN 4.0   No results for input(s): LIPASE, AMYLASE in the last 168 hours. No results for input(s): AMMONIA in the last 168 hours.  CBC: Recent Labs  Lab 09/16/17 1536  WBC 7.5  NEUTROABS 5.2  HGB 15.0  HCT 43.5  MCV 81.4  PLT 263    Cardiac Enzymes: Recent Labs  Lab 09/16/17 1536 09/16/17 2029  CKTOTAL 17* 18*  TROPONINI <0.03  --     BNP: Invalid input(s): POCBNP  CBG: No results for input(s): GLUCAP in the last 168 hours.  Microbiology: Results for orders placed or performed during the hospital encounter of 07/26/17  CSF culture     Status: None   Collection Time: 07/26/17  1:38 PM  Result Value Ref Range Status   Specimen Description   Final    CSF Performed at [facility], [address], [place], [place] [address]    Special Requests   Final    NONE Performed at [facility], [address] [place], [place] [address]    Gram Stain   Final    RARE WBC RARE RBC NO ORGANISMS SEEN Performed at [facility], [address], [place], [place] [address]    Culture   Final    NO GROWTH 3 DAYS Performed at [facility], [place] [address] [place],  [address]    Report Status 07/29/2017 FINAL  Final    Coagulation Studies: No results for input(s): LABPROT, INR in the last 72 hours.  Urinalysis: No results for input(s): COLORURINE, LABSPEC, PHURINE, GLUCOSEU, HGBUR, BILIRUBINUR, KETONESUR, PROTEINUR, UROBILINOGEN, NITRITE, LEUKOCYTESUR in the last 168 hours.  Invalid input(s): APPERANCEUR  Lipid Panel:     Component Value Date/Time   CHOL 283 (H) 12/29/2016 0728   TRIG 170 (H) 12/29/2016 0728   HDL 47 12/29/2016 0728   CHOLHDL 6.0 12/29/2016 0728   VLDL 34 12/29/2016 0728   LDLCALC 202 (H) 12/29/2016 0728    HgbA1C:  Lab Results  Component Value Date   HGBA1C 5.7 (H)  12/29/2016    Urine Drug Screen:      Component Value Date/Time   LABOPIA NONE DETECTED 07/12/2017 0032   COCAINSCRNUR NONE DETECTED 07/12/2017 0032   LABBENZ POSITIVE (A) 07/12/2017 0032   AMPHETMU NONE DETECTED 07/12/2017 0032   THCU NONE DETECTED 07/12/2017 0032   LABBARB NONE DETECTED 07/12/2017 0032    Alcohol Level: No results for input(s): ETH in the last 168 hours.  Other results: EKG: normal sinus rhythm  at 96 bpm.  Imaging: Mr Cervical Spine Wo Contrast  Result Date: 09/17/2017 CLINICAL DATA:  Worsening lower extremity weakness. History of Guillain-Barre syndrome. Upper extremity weakness and difficulty urinating. EXAM: MRI CERVICAL SPINE WITHOUT CONTRAST TECHNIQUE: Multiplanar, multisequence MR imaging of the cervical spine was performed. No intravenous contrast was administered. COMPARISON:  None. FINDINGS: Alignment: Normal. Vertebrae: No focal marrow lesion. No compression fracture or evidence of discitis osteomyelitis. Cord: Normal caliber and signal. Posterior Fossa, vertebral arteries, paraspinal tissues: Visualized posterior fossa is normal. Vertebral artery flow voids are preserved. No prevertebral effusion. Disc levels: The C1-2 articulation is normal. The C2-C5 levels are normal. C5-C6: Small central disc protrusion effaces the thecal sac ventrally and indents the anterior spinal cord. Mild spinal canal stenosis. No neural foraminal stenosis. C6-C7: Small central/right subarticular disc protrusion indents the ventral spinal cord. Mild spinal canal stenosis. No neural foraminal stenosis. C7-T1: Normal. T1-T2 is imaged in the sagittal plane only, with no visible disc herniation or stenosis. IMPRESSION: Small central disc protrusions at C5-6 and C6-7 indenting the ventral aspect of the spinal cord and causing mild spinal canal stenosis. No cord signal change. Electronically Signed   By: Ulyses Jarred M.D.   On: 09/17/2017 00:43     Assessment/Plan: 52 year old female  presenting with lower extremity weakness precluding ambulation.  Neurological examination inconsistent.  Patient able to ambulate overnight.  Has had previous extensive work up.  MRI of the cervical spine on admission shows small disc protrusions at C5-6 and C6-7 causing only mild spinal stenosis with no evidence of cord abnormality.  Do not suspect this is causing patient's presentation.  In fact, suspect that patient is actually able to ambulate.  With normal NCV, LP, length of disease and present reflexes, do not suspect GBS.    Recommendations: 1. Continued therapy 2. Acetylcholine receptor antibody level 3. Patient to continue follow up with Dr. Patrick North, MD Neurology [phone] 09/17/2017, 10:50 AM

## 2017-09-17 NOTE — Clinical Social Work Placement (Signed)
   CLINICAL SOCIAL WORK PLACEMENT  NOTE  Date:  09/17/2017  Patient Details  Name: Jenna Tyler MRN: [id number] Date of Birth: [birth year]  Clinical Social Work is seeking post-discharge placement for this patient at the [place] level of care (*CSW will initial, date and re-position this form in  chart as items are completed):  Yes   Patient/family provided with [place] Work Department's list of facilities offering this level of care within the geographic area requested by the patient (or if unable, by the patient's family).  Yes   Patient/family informed of their freedom to choose among providers that offer the needed level of care, that participate in Medicare, Medicaid or managed care program needed by the patient, have an available bed and are willing to accept the patient.  Yes   Patient/family informed of [place]'s ownership interest in [facility] and [facility], as well as of the fact that they are under no obligation to receive care at these facilities.  PASRR submitted to EDS on 09/17/17     PASRR number received on       Existing PASRR number confirmed on 09/17/17     FL2 transmitted to all facilities in geographic area requested by pt/family on 09/17/17     FL2 transmitted to all facilities within larger geographic area on       Patient informed that his/her managed care company has contracts with or will negotiate with certain facilities, including the following:            Patient/family informed of bed offers received.  Patient chooses bed at       Physician recommends and patient chooses bed at      Patient to be transferred to   on  .  Patient to be transferred to facility by       Patient family notified on   of transfer.  Name of family member notified:        PHYSICIAN       Additional Comment:    _______________________________________________ Annamaria Boots, [place] 09/17/2017, 12:18  PM

## 2017-09-18 DIAGNOSIS — R531 Weakness: Secondary | ICD-10-CM | POA: Diagnosis not present

## 2017-09-18 LAB — CBC
HCT: 44.4 % (ref 35.0–47.0)
Hemoglobin: 14.9 g/dL (ref 12.0–16.0)
MCH: 27.9 pg (ref 26.0–34.0)
MCHC: 33.6 g/dL (ref 32.0–36.0)
MCV: 83.1 fL (ref 80.0–100.0)
Platelets: 246 10*3/uL (ref 150–440)
RBC: 5.34 MIL/uL — ABNORMAL HIGH (ref 3.80–5.20)
RDW: 14.8 % — ABNORMAL HIGH (ref 11.5–14.5)
WBC: 5.2 10*3/uL (ref 3.6–11.0)

## 2017-09-18 LAB — BASIC METABOLIC PANEL
Anion gap: 9 (ref 5–15)
BUN: 18 mg/dL (ref 6–20)
CO2: 24 mmol/L (ref 22–32)
Calcium: 9 mg/dL (ref 8.9–10.3)
Chloride: 102 mmol/L (ref 101–111)
Creatinine, Ser: 1.17 mg/dL — ABNORMAL HIGH (ref 0.44–1.00)
GFR calc Af Amer: >60 mL/min (ref >60)
GFR calc non Af Amer: 53 mL/min — ABNORMAL LOW (ref >60)
Glucose, Bld: 117 mg/dL — ABNORMAL HIGH (ref 65–99)
Potassium: 4 mmol/L (ref 3.5–5.1)
Sodium: 135 mmol/L (ref 135–145)

## 2017-09-18 LAB — HIV ANTIBODY (ROUTINE TESTING W REFLEX): HIV Screen 4th Generation wRfx: NONREACTIVE

## 2017-09-18 NOTE — Clinical Social Work Note (Signed)
Patient is medically ready for discharge today. Patient will go to [facility] today. CSW notified patient that she will discharge today. CSW also notified Broadus John at [facility] [employer] that patient is discharging today. Patient will be transported by EMS. RN to call report and call for transport.   Annamaria Boots MSW, [place] [phone]

## 2017-09-18 NOTE — Clinical Social Work Placement (Signed)
   CLINICAL SOCIAL WORK PLACEMENT  NOTE  Date:  09/18/2017  Patient Details  Name: Jenna Tyler MRN: [id number] Date of Birth: [birth year]  Clinical Social Work is seeking post-discharge placement for this patient at the [place] level of care (*CSW will initial, date and re-position this form in  chart as items are completed):  Yes   Patient/family provided with [place] Work Department's list of facilities offering this level of care within the geographic area requested by the patient (or if unable, by the patient's family).  Yes   Patient/family informed of their freedom to choose among providers that offer the needed level of care, that participate in Medicare, Medicaid or managed care program needed by the patient, have an available bed and are willing to accept the patient.  Yes   Patient/family informed of [place]'s ownership interest in [facility] and [facility], as well as of the fact that they are under no obligation to receive care at these facilities.  PASRR submitted to EDS on 09/17/17     PASRR number received on       Existing PASRR number confirmed on 09/17/17     FL2 transmitted to all facilities in geographic area requested by pt/family on 09/17/17     FL2 transmitted to all facilities within larger geographic area on       Patient informed that his/her managed care company has contracts with or will negotiate with certain facilities, including the following:        Yes   Patient/family informed of bed offers received.  Patient chooses bed at [facility])     Physician recommends and patient chooses bed at [facility])    Patient to be transferred to ([facility]) on 09/18/17.  Patient to be transferred to facility by (EMS)     Patient family notified on 09/18/17 of transfer.  Name of family member notified:        PHYSICIAN       Additional Comment:     _______________________________________________ Annamaria Boots, [place] 09/18/2017, 8:33 AM

## 2017-09-18 NOTE — Plan of Care (Signed)
Pt d/ced to [facility], Rm 704.  Called report to [place].  Pt had some complaint of chronic leg pain b/c of GBS.  She will f/u outpt w/Neurology b/c of Guillan Barre Syndrome.  She is a standby asst to [facility].  Pt reported urinary retention is also resolved.  Nursing student removed IV.  Pt will transport with EMS.

## 2017-09-18 NOTE — Discharge Summary (Signed)
[facility] NAME: Jenna Tyler    MR#:  [id number]  DATE OF BIRTH:  1965/05/18  DATE OF ADMISSION:  09/16/2017   ADMITTING PHYSICIAN: Gorden Harms, MD  DATE OF DISCHARGE: 09/18/2017  PRIMARY CARE PHYSICIAN: [facility]   ADMISSION DIAGNOSIS:  Weakness [R53.1] DISCHARGE DIAGNOSIS:  Active Problems:   Weakness  SECONDARY DIAGNOSIS:   Past Medical History:  Diagnosis Date  . Asthma   . Guillain Barr syndrome ([place])   . Hyperlipidemia   . Hypertension   . Hypothyroidism   . 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 Pancreatitis    HOSPITAL COURSE:  *Acute on chronic bilateral upper/lower extremity weakness March 2019 work-up included MRI of the thoracic spine/lumbar spine/brain which were normal neurology doesn't recommend any further work up - don't think it's GBS. Recommends outpt Neuro f/up with Dr Melrose Nakayama (as she believes he told her she has GBS) - PT recommends STR and she's been accepted at [facility] where she is going.  *Chronic hypothyroidism, unspecified TSH 14.8 Increased Synthroid to 175 mcg daily, recheck TSH in 4 to 6 weeks for reevaluation  *Chronic asthma without exacerbation Stable  *Chronic benign essential hypertension Stable  DISCHARGE CONDITIONS:  stable CONSULTS OBTAINED:  Treatment Team:  Alexis Goodell, MD DRUG ALLERGIES:   Allergies  Allergen Reactions  . Nsaids Other (See Comments)    Internal bleeding  Intestinal bleeding  . Adhesive [Tape] Rash  . Aspirin Other (See Comments)    Cannot take due to ulcers.  . Penicillins Rash    Has patient had a PCN reaction causing immediate rash, facial/tongue/throat swelling, SOB or lightheadedness with hypotension: Yes Has patient had a PCN reaction causing severe rash involving mucus membranes or skin necrosis: No Has patient had a PCN reaction that required hospitalization: No Has patient had a PCN  reaction occurring within the last 10 years: Yes If all of the above answers are "NO", then may proceed with Cephalosporin use.    DISCHARGE MEDICATIONS:   Allergies as of 09/18/2017      Reactions   Nsaids Other (See Comments)   Internal bleeding  Intestinal bleeding   Adhesive [tape] Rash   Aspirin Other (See Comments)   Cannot take due to ulcers.   Penicillins Rash   Has patient had a PCN reaction causing immediate rash, facial/tongue/throat swelling, SOB or lightheadedness with hypotension: Yes Has patient had a PCN reaction causing severe rash involving mucus membranes or skin necrosis: No Has patient had a PCN reaction that required hospitalization: No Has patient had a PCN reaction occurring within the last 10 years: Yes If all of the above answers are "NO", then may proceed with Cephalosporin use.      Medication List    TAKE these medications   hydrOXYzine 50 MG capsule Commonly known as:  VISTARIL Take 50 mg by mouth every 6 (six) hours as needed for anxiety.   levothyroxine 150 MCG tablet Commonly known as:  SYNTHROID, LEVOTHROID Take 1 tablet (150 mcg total) by mouth daily.   mometasone-formoterol 100-5 MCG/ACT Aero Commonly known as:  DULERA Inhale 2 puffs into the lungs 2 (two) times daily.   QUEtiapine 50 MG tablet Commonly known as:  SEROQUEL Take 50 mg by mouth 3 (three) times daily.   QUEtiapine 200 MG tablet Commonly known as:  SEROQUEL Take 1 tablet (200 mg total) by mouth at bedtime.   tiZANidine 4 MG tablet Commonly  known as:  ZANAFLEX Take 12 mg by mouth at bedtime.   venlafaxine XR 150 MG 24 hr capsule Commonly known as:  EFFEXOR-XR Take 1 capsule (150 mg total) by mouth daily with breakfast.   VENTOLIN HFA 108 (90 Base) MCG/ACT inhaler Generic drug:  albuterol Inhale 1-2 puffs into the lungs every 6 (six) hours as needed for wheezing or shortness of breath.        DISCHARGE INSTRUCTIONS:   DIET:  Regular diet DISCHARGE  CONDITION:  Good ACTIVITY:  Activity as tolerated OXYGEN:  Home Oxygen: No.  Oxygen Delivery: room air DISCHARGE LOCATION:  nursing home   If you experience worsening of your admission symptoms, develop shortness of breath, life threatening emergency, suicidal or homicidal thoughts you must seek medical attention immediately by calling 911 or calling your MD immediately  if symptoms less severe.  You Must read complete instructions/literature along with all the possible adverse reactions/side effects for all the Medicines you take and that have been prescribed to you. Take any new Medicines after you have completely understood and accpet all the possible adverse reactions/side effects.   Please note  You were cared for by a hospitalist during your hospital stay. If you have any questions about your discharge medications or the care you received while you were in the hospital after you are discharged, you can call the unit and asked to speak with the hospitalist on call if the hospitalist that took care of you is not available. Once you are discharged, your primary care physician will handle any further medical issues. Please note that NO REFILLS for any discharge medications will be authorized once you are discharged, as it is imperative that you return to your primary care physician (or establish a relationship with a primary care physician if you do not have one) for your aftercare needs so that they can reassess your need for medications and monitor your lab values.    On the day of Discharge:  VITAL SIGNS:  Blood pressure 120/73, pulse 76, temperature (!) 97.4 F (36.3 C), temperature source Oral, resp. rate 18, height 5\' 5"  (1.651 m), weight 82.6 kg (182 lb 1.6 oz), SpO2 95 %. PHYSICAL EXAMINATION:  GENERAL:  52 y.o.-year-old patient lying in the bed with no acute distress.  EYES: Pupils equal, round, reactive to light and accommodation. No scleral icterus. Extraocular muscles intact.    HEENT: Head atraumatic, normocephalic. Oropharynx and nasopharynx clear.  NECK:  Supple, no jugular venous distention. No thyroid enlargement, no tenderness.  LUNGS: Normal breath sounds bilaterally, no wheezing, rales,rhonchi or crepitation. No use of accessory muscles of respiration.  CARDIOVASCULAR: S1, S2 normal. No murmurs, rubs, or gallops.  ABDOMEN: Soft, non-tender, non-distended. Bowel sounds present. No organomegaly or mass.  EXTREMITIES: No pedal edema, cyanosis, or clubbing.  NEUROLOGIC: Cranial nerves II through XII are intact. Muscle strength 5/5 in all extremities. Sensation intact. Gait not checked.  PSYCHIATRIC: The patient is alert and oriented x 3.  SKIN: No obvious rash, lesion, or ulcer.  DATA REVIEW:   CBC Recent Labs  Lab 09/18/17 0427  WBC 5.2  HGB 14.9  HCT 44.4  PLT 246    Chemistries  Recent Labs  Lab 09/16/17 1536 09/18/17 0427  NA 135 135  K 3.8 4.0  CL 106 102  CO2 20* 24  GLUCOSE 152* 117*  BUN 22* 18  CREATININE 1.16* 1.17*  CALCIUM 9.2 9.0  AST 23  --   ALT 18  --   ALKPHOS  81  --   BILITOT 0.6  --      Follow-up Information    [facility] Schedule an appointment as soon as possible for a visit in 5 day(s).   Contact information: [address] [place] [address] [phone]        Anabel Bene, MD. Schedule an appointment as soon as possible for a visit in 2 week(s).   Specialty:  Neurology Contact information: [facility] [place] [address] [phone]           Management plans discussed with the patient, family and they are in agreement.  CODE STATUS: Full Code   TOTAL TIME TAKING CARE OF THIS PATIENT: 45 minutes.    Max Sane M.D on 09/18/2017 at 8:12 AM  Between 7am to 6pm - Pager - [phone]  After 6pm go to [email or web address] - Technical brewer [facility]  Office  [phone]  CC: Primary  care physician; [facility]   Note: This dictation was prepared with Dragon dictation along with smaller phrase technology. Any transcriptional errors that result from this process are unintentional.

## 2017-09-18 NOTE — Progress Notes (Signed)
Subjective: Patient reports no improvement in strength.    Objective: Current vital signs: BP 133/75 (BP Location: Right Arm)   Pulse 78   Temp 97.7 F (36.5 C) (Oral)   Resp 16   Ht 5\' 5"  (1.651 m)   Wt 82.6 kg (182 lb 1.6 oz)   SpO2 92%   BMI 30.30 kg/m  Vital signs in last 24 hours: Temp:  [97.4 F (36.3 C)-98 F (36.7 C)] 97.7 F (36.5 C) (05/29 0821) Pulse Rate:  [76-80] 78 (05/29 0821) Resp:  [16-20] 16 (05/29 0821) BP: (120-133)/(73-82) 133/75 (05/29 0821) SpO2:  [92 %-95 %] 92 % (05/29 0821)  Intake/Output from previous day: 05/28 0701 - 05/29 0700 In: 200 [P.O.:200] Out: 300 [Urine:300] Intake/Output this shift: Total I/O In: 120 [P.O.:120] Out: -  Nutritional status:  Diet Order           Diet - low sodium heart healthy        Diet regular Room service appropriate? Yes; Fluid consistency: Thin  Diet effective now          Neurologic Exam: Mental Status: Alert, oriented, thought content appropriate.  Speech fluent without evidence of aphasia.  Able to follow 3 step commands without difficulty. Cranial Nerves: II: Discs flat bilaterally; Visual fields grossly normal, pupils equal, round, reactive to light and accommodation III,IV, VI: ptosis not present, extra-ocular motions intact bilaterally V,VII: smile symmetric, facial light touch sensation normal bilaterally VIII: hearing normal bilaterally IX,X: gag reflex present XI: bilateral shoulder shrug XII: midline tongue extension Motor: Right :  Upper extremity   5/5                                      Left:     Upper extremity   5/5 Unable to lift legs of the bed when asked but when I say I am going to lift the legs for her she is able to lift them and maintain them above gravity with no assistance other than hand touch.   Sensory: Pinprick and light touch intact throughout, bilaterally  Lab Results: Basic Metabolic Panel: Recent Labs  Lab 09/16/17 1536 09/18/17 0427  NA 135 135  K 3.8 4.0   CL 106 102  CO2 20* 24  GLUCOSE 152* 117*  BUN 22* 18  CREATININE 1.16* 1.17*  CALCIUM 9.2 9.0    Liver Function Tests: Recent Labs  Lab 09/16/17 1536  AST 23  ALT 18  ALKPHOS 81  BILITOT 0.6  PROT 7.0  ALBUMIN 4.0   No results for input(s): LIPASE, AMYLASE in the last 168 hours. No results for input(s): AMMONIA in the last 168 hours.  CBC: Recent Labs  Lab 09/16/17 1536 09/18/17 0427  WBC 7.5 5.2  NEUTROABS 5.2  --   HGB 15.0 14.9  HCT 43.5 44.4  MCV 81.4 83.1  PLT 263 246    Cardiac Enzymes: Recent Labs  Lab 09/16/17 1536 09/16/17 2029  CKTOTAL 17* 18*  TROPONINI <0.03  --     Lipid Panel: No results for input(s): CHOL, TRIG, HDL, CHOLHDL, VLDL, LDLCALC in the last 168 hours.  CBG: No results for input(s): GLUCAP in the last 168 hours.  Microbiology: Results for orders placed or performed during the hospital encounter of 07/26/17  CSF culture     Status: None   Collection Time: 07/26/17  1:38 PM  Result Value Ref Range Status  Specimen Description   Final    CSF Performed at [facility], [address], [place], [place] [address]    Special Requests   Final    NONE Performed at [facility], [address] [place], [place] [address]    Gram Stain   Final    RARE WBC RARE RBC NO ORGANISMS SEEN Performed at [facility], [address], [place], [place] [address]    Culture   Final    NO GROWTH 3 DAYS Performed at [facility], [place] [address], [place], [place] [address]    Report Status 07/29/2017 FINAL  Final    Coagulation Studies: No results for input(s): LABPROT, INR in the last 72 hours.  Imaging: Mr Cervical Spine Wo Contrast  Result Date: 09/17/2017 CLINICAL DATA:  Worsening lower extremity weakness. History of Guillain-Barre syndrome. Upper extremity weakness and difficulty urinating. EXAM: MRI CERVICAL SPINE WITHOUT CONTRAST TECHNIQUE: Multiplanar, multisequence MR imaging of the  cervical spine was performed. No intravenous contrast was administered. COMPARISON:  None. FINDINGS: Alignment: Normal. Vertebrae: No focal marrow lesion. No compression fracture or evidence of discitis osteomyelitis. Cord: Normal caliber and signal. Posterior Fossa, vertebral arteries, paraspinal tissues: Visualized posterior fossa is normal. Vertebral artery flow voids are preserved. No prevertebral effusion. Disc levels: The C1-2 articulation is normal. The C2-C5 levels are normal. C5-C6: Small central disc protrusion effaces the thecal sac ventrally and indents the anterior spinal cord. Mild spinal canal stenosis. No neural foraminal stenosis. C6-C7: Small central/right subarticular disc protrusion indents the ventral spinal cord. Mild spinal canal stenosis. No neural foraminal stenosis. C7-T1: Normal. T1-T2 is imaged in the sagittal plane only, with no visible disc herniation or stenosis. IMPRESSION: Small central disc protrusions at C5-6 and C6-7 indenting the ventral aspect of the spinal cord and causing mild spinal canal stenosis. No cord signal change. Electronically Signed   By: Ulyses Jarred M.D.   On: 09/17/2017 00:43    Medications:  I have reviewed the patient's current medications. Scheduled: . docusate sodium  100 mg Oral BID  . heparin  5,000 Units Subcutaneous Q8H  . levothyroxine  175 mcg Oral QAC breakfast  . mometasone-formoterol  2 puff Inhalation BID  . QUEtiapine  200 mg Oral QHS  . QUEtiapine  50 mg Oral TID  . sodium chloride flush  3 mL Intravenous Q12H  . tiZANidine  12 mg Oral QHS  . venlafaxine XR  150 mg Oral Q breakfast    Assessment/Plan: Agree with rehab and continued outpatient evaluation.     LOS: 0 days   Alexis Goodell, MD Neurology [phone] 09/18/2017  12:02 PM

## 2017-09-18 NOTE — Discharge Instructions (Signed)
Weakness Weakness is a lack of strength. You may feel weak all over your body (generalized), or you may feel weak in one specific part of your body (focal). There are many potential causes of weakness. Sometimes, the cause of your weakness may not be known. Some causes of weakness can be serious, so it is important to see your doctor. Follow these instructions at home:  Rest as needed.  Try to get enough sleep. Talk to your doctor about how much sleep you need each night.  Take over-the-counter and prescription medicines only as told by your doctor.  Eat a healthy, well-balanced diet. This includes: ? Proteins to build muscles, such as lean meats and fish. ? Fresh fruits and vegetables. ? Carbohydrates to boost energy, such as whole grains.  Drink enough fluid to keep your pee (urine) clear or pale yellow.  Do strength exercises, such as arm curls and leg raises, for 30 minutes at least 2 days a week or as told by your doctor.  Think about working with a physical therapist or trainer to help you get stronger.  Keep all follow-up visits as told by your doctor. This is important. Contact a doctor if:  Your weakness does not get better or it gets worse.  Your weakness affects your ability to: ? Think clearly. ? Do your normal daily activities. Get help right away if:  You have sudden weakness.  You have trouble breathing or shortness of breath.  You have problems with your vision.  You have trouble talking or swallowing.  You have trouble standing or walking.  You have chest pain.  You are light-headed.  You pass out (lose consciousness). This information is not intended to replace advice given to you by your health care provider. Make sure you discuss any questions you have with your health care provider. Document Released: 03/22/2008 Document Revised: 05/05/2015 Document Reviewed: 01/28/2015 Elsevier Interactive Patient Education  2018 Elsevier Inc.  

## 2017-09-20 ENCOUNTER — Ambulatory Visit: Payer: Self-pay

## 2017-09-21 ENCOUNTER — Other Ambulatory Visit
Admission: RE | Admit: 2017-09-21 | Discharge: 2017-09-21 | Disposition: A | Discharge status: Home / Self Care | Payer: BLUE CROSS/BLUE SHIELD | Source: Ambulatory Visit | Attending: Family Medicine | Admitting: Family Medicine

## 2017-09-21 DIAGNOSIS — N390 Urinary tract infection, site not specified: Secondary | ICD-10-CM | POA: Insufficient documentation

## 2017-09-21 LAB — URINALYSIS, COMPLETE (UACMP) WITH MICROSCOPIC
Bacteria, UA: NONE SEEN
Bilirubin Urine: NEGATIVE
Glucose, UA: NEGATIVE mg/dL
Hgb urine dipstick: NEGATIVE
Ketones, ur: NEGATIVE mg/dL
Nitrite: NEGATIVE
Protein, ur: 30 mg/dL — AB
Specific Gravity, Urine: 1.02 (ref 1.005–1.030)
pH: 5 (ref 5.0–8.0)

## 2017-09-22 LAB — URINE CULTURE: Culture: <10000 — AB

## 2017-09-25 LAB — ACETYLCHOLINE RECEPTOR AB, ALL
Acety choline binding ab: <0.03 nmol/L (ref 0.00–0.24)
Acetylchol Block Ab: 12 % (ref 0–25)
Acetylcholine Modulat Ab: <12 % (ref 0–20)

## 2017-10-18 DIAGNOSIS — M79606 Pain in leg, unspecified: Secondary | ICD-10-CM | POA: Insufficient documentation

## 2017-10-18 DIAGNOSIS — R29898 Other symptoms and signs involving the musculoskeletal system: Secondary | ICD-10-CM | POA: Insufficient documentation

## 2018-01-06 ENCOUNTER — Encounter: Payer: Self-pay | Admitting: Urology

## 2018-01-06 ENCOUNTER — Ambulatory Visit: Payer: Self-pay | Admitting: Urology

## 2018-01-15 DIAGNOSIS — R569 Unspecified convulsions: Secondary | ICD-10-CM | POA: Insufficient documentation

## 2018-05-21 ENCOUNTER — Other Ambulatory Visit (HOSPITAL_COMMUNITY): Payer: Self-pay | Admitting: Family Medicine

## 2018-05-21 ENCOUNTER — Other Ambulatory Visit: Payer: Self-pay | Admitting: Family Medicine

## 2018-05-21 DIAGNOSIS — R339 Retention of urine, unspecified: Secondary | ICD-10-CM

## 2018-06-18 ENCOUNTER — Ambulatory Visit: Payer: Self-pay | Admitting: Urology

## 2018-08-27 ENCOUNTER — Other Ambulatory Visit: Payer: Self-pay

## 2018-08-27 ENCOUNTER — Emergency Department
Admission: EM | Admit: 2018-08-27 | Discharge: 2018-08-27 | Disposition: A | Discharge status: Home / Self Care | Payer: BLUE CROSS/BLUE SHIELD | Source: Home / Self Care | Attending: Emergency Medicine | Admitting: Emergency Medicine

## 2018-08-27 DIAGNOSIS — J189 Pneumonia, unspecified organism: Secondary | ICD-10-CM | POA: Diagnosis not present

## 2018-08-27 DIAGNOSIS — E039 Hypothyroidism, unspecified: Secondary | ICD-10-CM | POA: Insufficient documentation

## 2018-08-27 DIAGNOSIS — R233 Spontaneous ecchymoses: Secondary | ICD-10-CM | POA: Insufficient documentation

## 2018-08-27 DIAGNOSIS — Z87891 Personal history of nicotine dependence: Secondary | ICD-10-CM | POA: Insufficient documentation

## 2018-08-27 DIAGNOSIS — Z79899 Other long term (current) drug therapy: Secondary | ICD-10-CM | POA: Insufficient documentation

## 2018-08-27 DIAGNOSIS — R05 Cough: Secondary | ICD-10-CM | POA: Diagnosis not present

## 2018-08-27 DIAGNOSIS — I10 Essential (primary) hypertension: Secondary | ICD-10-CM | POA: Insufficient documentation

## 2018-08-27 DIAGNOSIS — T148XXA Other injury of unspecified body region, initial encounter: Secondary | ICD-10-CM

## 2018-08-27 LAB — BASIC METABOLIC PANEL
Anion gap: 10 (ref 5–15)
BUN: 18 mg/dL (ref 6–20)
CO2: 22 mmol/L (ref 22–32)
Calcium: 8.8 mg/dL — ABNORMAL LOW (ref 8.9–10.3)
Chloride: 105 mmol/L (ref 98–111)
Creatinine, Ser: 0.94 mg/dL (ref 0.44–1.00)
GFR calc Af Amer: >60 mL/min (ref >60)
GFR calc non Af Amer: >60 mL/min (ref >60)
Glucose, Bld: 137 mg/dL — ABNORMAL HIGH (ref 70–99)
Potassium: 3.5 mmol/L (ref 3.5–5.1)
Sodium: 137 mmol/L (ref 135–145)

## 2018-08-27 LAB — HEPATIC FUNCTION PANEL
ALT: 45 U/L — ABNORMAL HIGH (ref 0–44)
AST: 46 U/L — ABNORMAL HIGH (ref 15–41)
Albumin: 3.7 g/dL (ref 3.5–5.0)
Alkaline Phosphatase: 120 U/L (ref 38–126)
Bilirubin, Direct: 0.2 mg/dL (ref 0.0–0.2)
Indirect Bilirubin: 0.9 mg/dL (ref 0.3–0.9)
Total Bilirubin: 1.1 mg/dL (ref 0.3–1.2)
Total Protein: 6.9 g/dL (ref 6.5–8.1)

## 2018-08-27 LAB — APTT: aPTT: 37 seconds — ABNORMAL HIGH (ref 24–36)

## 2018-08-27 LAB — CBC
HCT: 41.5 % (ref 36.0–46.0)
Hemoglobin: 14.1 g/dL (ref 12.0–15.0)
MCH: 27.2 pg (ref 26.0–34.0)
MCHC: 34 g/dL (ref 30.0–36.0)
MCV: 80.1 fL (ref 80.0–100.0)
Platelets: 214 10*3/uL (ref 150–400)
RBC: 5.18 MIL/uL — ABNORMAL HIGH (ref 3.87–5.11)
RDW: 14.2 % (ref 11.5–15.5)
WBC: 12.1 10*3/uL — ABNORMAL HIGH (ref 4.0–10.5)
nRBC: 0 % (ref 0.0–0.2)

## 2018-08-27 LAB — PROTIME-INR
INR: 1 (ref 0.8–1.2)
Prothrombin Time: 13.4 seconds (ref 11.4–15.2)

## 2018-08-27 MED ORDER — SODIUM CHLORIDE 0.9% FLUSH: 3.0000 mL | Freq: Once | INTRAVENOUS | Status: DC

## 2018-08-27 NOTE — ED Triage Notes (Signed)
Pt presents  via POV with c/o new onset of generalized bruising, weakness, and body aches since Sunday. Pt denies fevers at home. Pt denies use of blood thinners or any hx of clotting disorders. Pt denies any recent injuries or falls. Bruising noted to bilateral arms and legs.

## 2018-08-27 NOTE — ED Notes (Signed)
Reports pain to b/l lower extremities and b/l ribs cage. Patient pain is atraumatic. Denies using blood thinners. States woke yesterday with bruises into b/i upper ext now all over body.

## 2018-08-27 NOTE — ED Provider Notes (Signed)
[facility] Emergency Department Provider Note   ____________________________________________   I have reviewed the triage vital signs and the nursing notes.   HISTORY  Chief Complaint Bruising  History limited by: Not Limited   HPI Jenna Tyler is a 53 y.o. female who presents to the emergency department today because of concern for bruising. The patient states that she first noticed some bruising on her forearms three days ago. Since then she has also noticed some bruising come up on her legs and torso. She denies any trauma. Some discomfort with the bruising. Has not noticed any blood in her stool or urine. Denies any new medications. Denies similar symptoms in the past.    Records reviewed. Per medical record review patient has a history of HLD, HTN  Past Medical History:  Diagnosis Date  . Asthma   . Guillain Barr syndrome ([place])   . Hyperlipidemia   . Hypertension   . Hypothyroidism   . 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 Pancreatitis     Patient Active Problem List   Diagnosis Date Noted  . Weakness 09/16/2017  . AKI (acute kidney injury) ([place]) 07/12/2017  . HTN (hypertension) 12/29/2016  . COPD exacerbation ([place]) 12/29/2016  . Substance induced mood disorder ([place]) 12/29/2016  . Alcohol use disorder, severe, dependence ([place]) 12/27/2016  . 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 Prediabetes 07/07/2015  . Primary hypothyroidism 04/04/2015  . Pre-diabetes 04/04/2015  . Vitamin D deficiency 04/04/2015  . Anxiety 10/12/2014  . Anxiety, generalized 10/12/2014  . H/O: hypothyroidism 10/12/2014  . Insomnia, persistent 10/12/2014  . Cannot sleep 10/12/2014  . Depression, major, recurrent, moderate ([place]) 10/12/2014  . Drug abuse, opioid type ([place]) 10/12/2014  . 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/2013    Past Surgical  History:  Procedure Laterality Date  . ABDOMINAL HYSTERECTOMY    . BREAST BIOPSY Right 2012   negative  . CHOLECYSTECTOMY    . 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 OTHER SURGICAL HISTORY     excision of lipoma  . OTHER SURGICAL HISTORY     Abdominal surgery  . THYROID SURGERY     thyroidectomy    Prior to Admission medications   Medication Sig Start Date End Date Taking? Authorizing Provider  albuterol (VENTOLIN HFA) 108 (90 Base) MCG/ACT inhaler Inhale 1-2 puffs into the lungs every 6 (six) hours as needed for wheezing or shortness of breath.     [provider]  hydrOXYzine (VISTARIL) 50 MG capsule Take 50 mg by mouth every 6 (six) hours as needed for anxiety.     [provider]  levothyroxine (SYNTHROID, LEVOTHROID) 150 MCG tablet Take 1 tablet (150 mcg total) by mouth daily. 07/16/17   Hillary Bow, MD  mometasone-formoterol (DULERA) 100-5 MCG/ACT AERO Inhale 2 puffs into the lungs 2 (two) times daily. 01/06/17   Pucilowska, Herma Ard B, MD  QUEtiapine (SEROQUEL) 200 MG tablet Take 1 tablet (200 mg total) by mouth at bedtime. 07/16/17   Hillary Bow, MD  QUEtiapine (SEROQUEL) 50 MG tablet Take 50 mg by mouth 3 (three) times daily.    [provider]  tiZANidine (ZANAFLEX) 4 MG tablet Take 12 mg by mouth at bedtime.    [provider]  venlafaxine XR (EFFEXOR-XR) 150 MG 24 hr capsule Take 1 capsule (150 mg total) by mouth daily with breakfast. 01/07/17   Pucilowska, Wardell Honour, MD    Allergies Nsaids;  Adhesive [tape]; Aspirin; and Penicillins  Family History  Problem Relation Age of Onset  . Breast cancer Sister 28  . Breast cancer Maternal Aunt 73  . Breast cancer Maternal Grandmother 60  . Breast cancer Maternal Aunt 79    Social History Social History   Tobacco Use  . Smoking status: Former Smoker    Last attempt to quit: 12/28/1997    Years since quitting: 20.6  . Smokeless tobacco: Never Used  Substance Use Topics  . Alcohol  use: Not Currently    Alcohol/week: 0.0 standard drinks  . Drug use: No    Review of Systems Constitutional: No fever/chills Eyes: No visual changes. ENT: No sore throat. Cardiovascular: Denies chest pain. Respiratory: Denies shortness of breath. Gastrointestinal: No abdominal pain.  No nausea, no vomiting.  No diarrhea.   Genitourinary: Negative for dysuria. Musculoskeletal: Positive for leg pain.  Skin: Positive for bruising.  Neurological: Negative for headaches, focal weakness or numbness.  ____________________________________________   PHYSICAL EXAM:  VITAL SIGNS: ED Triage Vitals  Enc Vitals Group     BP 08/27/18 1513 124/67     Pulse Rate 08/27/18 1513 99     Resp 08/27/18 1513 18     Temp 08/27/18 1513 98.6 F (37 C)     Temp Source 08/27/18 1513 Oral     SpO2 08/27/18 1513 94 %     Weight 08/27/18 1514 185 lb (83.9 kg)     Height 08/27/18 1514 5\' 5"  (1.651 m)     Head Circumference --      [facility] Flow --      Pain Score 08/27/18 1513 7   Constitutional: Alert and oriented.  Eyes: Conjunctivae are normal.  ENT      Head: Normocephalic and atraumatic.      Nose: No congestion/rhinnorhea.      Mouth/Throat: Mucous membranes are moist.      Neck: No stridor. Hematological/Lymphatic/Immunilogical: No cervical lymphadenopathy. Cardiovascular: Normal rate, regular rhythm.  No murmurs, rubs, or gallops.  Respiratory: Normal respiratory effort without tachypnea nor retractions. Breath sounds are clear and equal bilaterally. No wheezes/rales/rhonchi. Gastrointestinal: Soft and non tender. No rebound. No guarding.  Genitourinary: Deferred Musculoskeletal: Normal range of motion in all extremities. No lower extremity edema. Neurologic:  Normal speech and language. No gross focal neurologic deficits are appreciated.  Skin:  Bruising noted over the patients bilateral forearms and legs.  Psychiatric: Mood and affect are normal. Speech and behavior are normal. Patient  exhibits appropriate insight and judgment.  ____________________________________________    LABS (pertinent positives/negatives)  BMP wnl except glu 137, ca 8.8 Hepatic function panel wnl except ast 46, alt 45 CBC wbc 12.1, hgb 14.1, plt 214 INR 1.0 PTT 37 ____________________________________________   EKG  I, Nance Pear, attending physician, personally viewed and interpreted this EKG  EKG Time: 1524 Rate: 95 Rhythm: normal sinus rhythm Axis: normal Intervals: qtc 527 QRS: narrow, low voltage ST changes: no st elevation Impression: abnormal ekg   ____________________________________________    RADIOLOGY  None  ____________________________________________   PROCEDURES  Procedures  ____________________________________________   INITIAL IMPRESSION / ASSESSMENT AND PLAN / ED COURSE  Pertinent labs & imaging results that were available during my care of the patient were reviewed by me and considered in my medical decision making (see chart for details).   Patient presented to the emergency department today because of concern for new onset bruising. The patient blood work with normal platelet, INR 1.0, ptt minimally elevated at 37. Unclear  cause of the patient's bruising at this time. Doubt that the minimally elevated ptt would be the cause of the bruising. Will have patient follow up with PCP.   ____________________________________________   FINAL CLINICAL IMPRESSION(S) / ED DIAGNOSES  Final diagnoses:  Bruising     Note: This dictation was prepared with Dragon dictation. Any transcriptional errors that result from this process are unintentional     Nance Pear, MD 08/27/18 2148

## 2018-08-27 NOTE — ED Notes (Signed)
Triage completed by this RN under Elmyra Ricks, NT in error.

## 2018-08-27 NOTE — Discharge Instructions (Signed)
Please seek medical attention for any high fevers, chest pain, shortness of breath, change in behavior, persistent vomiting, bloody stool or any other new or concerning symptoms.  

## 2018-08-28 ENCOUNTER — Inpatient Hospital Stay
Admission: EM | Admit: 2018-08-28 | Discharge: 2018-08-31 | DRG: 193 | Disposition: A | Discharge status: Home Health | Payer: BLUE CROSS/BLUE SHIELD | Attending: Internal Medicine | Admitting: Internal Medicine

## 2018-08-28 ENCOUNTER — Other Ambulatory Visit: Payer: Self-pay

## 2018-08-28 ENCOUNTER — Encounter: Payer: Self-pay | Admitting: *Deleted

## 2018-08-28 ENCOUNTER — Emergency Department: Payer: BLUE CROSS/BLUE SHIELD

## 2018-08-28 DIAGNOSIS — E785 Hyperlipidemia, unspecified: Secondary | ICD-10-CM | POA: Diagnosis present

## 2018-08-28 DIAGNOSIS — Z91048 Other nonmedicinal substance allergy status: Secondary | ICD-10-CM

## 2018-08-28 DIAGNOSIS — L239 Allergic contact dermatitis, unspecified cause: Secondary | ICD-10-CM | POA: Diagnosis present

## 2018-08-28 DIAGNOSIS — E890 Postprocedural hypothyroidism: Secondary | ICD-10-CM | POA: Diagnosis present

## 2018-08-28 DIAGNOSIS — J440 Chronic obstructive pulmonary disease with acute lower respiratory infection: Secondary | ICD-10-CM | POA: Diagnosis present

## 2018-08-28 DIAGNOSIS — Z79890 Hormone replacement therapy (postmenopausal): Secondary | ICD-10-CM

## 2018-08-28 DIAGNOSIS — F329 Major depressive disorder, single episode, unspecified: Secondary | ICD-10-CM | POA: Diagnosis present

## 2018-08-28 DIAGNOSIS — J189 Pneumonia, unspecified organism: Principal | ICD-10-CM | POA: Diagnosis present

## 2018-08-28 DIAGNOSIS — Z7951 Long term (current) use of inhaled steroids: Secondary | ICD-10-CM | POA: Diagnosis not present

## 2018-08-28 DIAGNOSIS — Z20828 Contact with and (suspected) exposure to other viral communicable diseases: Secondary | ICD-10-CM | POA: Diagnosis present

## 2018-08-28 DIAGNOSIS — I10 Essential (primary) hypertension: Secondary | ICD-10-CM | POA: Diagnosis present

## 2018-08-28 DIAGNOSIS — Q8501 Neurofibromatosis, type 1: Secondary | ICD-10-CM | POA: Diagnosis not present

## 2018-08-28 DIAGNOSIS — Z79899 Other long term (current) drug therapy: Secondary | ICD-10-CM

## 2018-08-28 DIAGNOSIS — Z87891 Personal history of nicotine dependence: Secondary | ICD-10-CM

## 2018-08-28 DIAGNOSIS — Z886 Allergy status to analgesic agent status: Secondary | ICD-10-CM

## 2018-08-28 DIAGNOSIS — Z880 Allergy status to penicillin: Secondary | ICD-10-CM

## 2018-08-28 DIAGNOSIS — J9601 Acute respiratory failure with hypoxia: Secondary | ICD-10-CM | POA: Diagnosis present

## 2018-08-28 DIAGNOSIS — G2581 Restless legs syndrome: Secondary | ICD-10-CM | POA: Diagnosis present

## 2018-08-28 DIAGNOSIS — Z803 Family history of malignant neoplasm of breast: Secondary | ICD-10-CM

## 2018-08-28 DIAGNOSIS — R05 Cough: Secondary | ICD-10-CM | POA: Diagnosis present

## 2018-08-28 LAB — CBC WITH DIFFERENTIAL/PLATELET
Abs Immature Granulocytes: 0.06 10*3/uL (ref 0.00–0.07)
Basophils Absolute: 0.1 10*3/uL (ref 0.0–0.1)
Basophils Relative: 1 %
Eosinophils Absolute: 0.1 10*3/uL (ref 0.0–0.5)
Eosinophils Relative: 1 %
HCT: 38.3 % (ref 36.0–46.0)
Hemoglobin: 12.9 g/dL (ref 12.0–15.0)
Immature Granulocytes: 1 %
Lymphocytes Relative: 13 %
Lymphs Abs: 1.6 10*3/uL (ref 0.7–4.0)
MCH: 26.8 pg (ref 26.0–34.0)
MCHC: 33.7 g/dL (ref 30.0–36.0)
MCV: 79.6 fL — ABNORMAL LOW (ref 80.0–100.0)
Monocytes Absolute: 1 10*3/uL (ref 0.1–1.0)
Monocytes Relative: 8 %
Neutro Abs: 9.7 10*3/uL — ABNORMAL HIGH (ref 1.7–7.7)
Neutrophils Relative %: 76 %
Platelets: 255 10*3/uL (ref 150–400)
RBC: 4.81 MIL/uL (ref 3.87–5.11)
RDW: 14.3 % (ref 11.5–15.5)
WBC: 12.5 10*3/uL — ABNORMAL HIGH (ref 4.0–10.5)
nRBC: 0 % (ref 0.0–0.2)

## 2018-08-28 LAB — COMPREHENSIVE METABOLIC PANEL
ALT: 38 U/L (ref 0–44)
AST: 25 U/L (ref 15–41)
Albumin: 3.5 g/dL (ref 3.5–5.0)
Alkaline Phosphatase: 146 U/L — ABNORMAL HIGH (ref 38–126)
Anion gap: 11 (ref 5–15)
BUN: 17 mg/dL (ref 6–20)
CO2: 19 mmol/L — ABNORMAL LOW (ref 22–32)
Calcium: 8.4 mg/dL — ABNORMAL LOW (ref 8.9–10.3)
Chloride: 108 mmol/L (ref 98–111)
Creatinine, Ser: 0.97 mg/dL (ref 0.44–1.00)
GFR calc Af Amer: >60 mL/min (ref >60)
GFR calc non Af Amer: >60 mL/min (ref >60)
Glucose, Bld: 141 mg/dL — ABNORMAL HIGH (ref 70–99)
Potassium: 3.5 mmol/L (ref 3.5–5.1)
Sodium: 138 mmol/L (ref 135–145)
Total Bilirubin: 0.4 mg/dL (ref 0.3–1.2)
Total Protein: 6.8 g/dL (ref 6.5–8.1)

## 2018-08-28 LAB — SARS CORONAVIRUS 2 BY RT PCR (HOSPITAL ORDER, PERFORMED IN ~~LOC~~ HOSPITAL LAB): SARS Coronavirus 2: NEGATIVE

## 2018-08-28 LAB — TROPONIN I: Troponin I: <0.03 ng/mL (ref <0.03)

## 2018-08-28 LAB — APTT: aPTT: 41 seconds — ABNORMAL HIGH (ref 24–36)

## 2018-08-28 LAB — PROTIME-INR
INR: 1 (ref 0.8–1.2)
Prothrombin Time: 13.2 seconds (ref 11.4–15.2)

## 2018-08-28 MED ORDER — ENOXAPARIN SODIUM 40 MG/0.4ML ~~LOC~~ SOLN
40.0000 mg | SUBCUTANEOUS | Status: DC
  Administered 2018-08-29 – 2018-08-31 (×3): 40 mg via SUBCUTANEOUS
  Filled 2018-08-28 (×3): qty 0.4

## 2018-08-28 MED ORDER — METHYLPREDNISOLONE SODIUM SUCC 125 MG IJ SOLR
125.0000 mg | Freq: Once | INTRAMUSCULAR | Status: AC
  Administered 2018-08-28: 125 mg via INTRAVENOUS
  Filled 2018-08-28: qty 2

## 2018-08-28 MED ORDER — SODIUM CHLORIDE 0.9 % IV SOLN
INTRAVENOUS | Status: DC
  Administered 2018-08-29 – 2018-08-31 (×6): via INTRAVENOUS

## 2018-08-28 MED ORDER — HYDROXYZINE HCL 25 MG PO TABS: 50.0000 mg | ORAL_TABLET | Freq: Four times a day (QID) | ORAL | Status: DC | PRN

## 2018-08-28 MED ORDER — ONDANSETRON HCL 4 MG/2ML IJ SOLN: 4.0000 mg | Freq: Four times a day (QID) | INTRAMUSCULAR | Status: DC | PRN

## 2018-08-28 MED ORDER — ACETAMINOPHEN 325 MG PO TABS: 650.0000 mg | ORAL_TABLET | Freq: Four times a day (QID) | ORAL | Status: DC | PRN

## 2018-08-28 MED ORDER — ACETAMINOPHEN 650 MG RE SUPP: 650.0000 mg | Freq: Four times a day (QID) | RECTAL | Status: DC | PRN

## 2018-08-28 MED ORDER — GUAIFENESIN ER 600 MG PO TB12
600.0000 mg | ORAL_TABLET | Freq: Two times a day (BID) | ORAL | Status: DC
  Administered 2018-08-29 – 2018-08-31 (×6): 600 mg via ORAL
  Filled 2018-08-28 (×6): qty 1

## 2018-08-28 MED ORDER — SODIUM CHLORIDE 0.9 % IV SOLN
500.0000 mg | INTRAVENOUS | Status: DC
  Administered 2018-08-29 – 2018-08-30 (×2): 500 mg via INTRAVENOUS
  Filled 2018-08-28 (×3): qty 500

## 2018-08-28 MED ORDER — IPRATROPIUM-ALBUTEROL 0.5-2.5 (3) MG/3ML IN SOLN
3.0000 mL | Freq: Four times a day (QID) | RESPIRATORY_TRACT | Status: DC
  Administered 2018-08-29: 3 mL via RESPIRATORY_TRACT
  Filled 2018-08-28: qty 3

## 2018-08-28 MED ORDER — QUETIAPINE FUMARATE 200 MG PO TABS: 200.0000 mg | ORAL_TABLET | Freq: Every day | ORAL | Status: DC

## 2018-08-28 MED ORDER — TIZANIDINE HCL 4 MG PO TABS: 12.0000 mg | ORAL_TABLET | Freq: Every day | ORAL | Status: DC

## 2018-08-28 MED ORDER — QUETIAPINE FUMARATE 25 MG PO TABS
50.0000 mg | ORAL_TABLET | Freq: Three times a day (TID) | ORAL | Status: DC
  Administered 2018-08-29: 50 mg via ORAL
  Filled 2018-08-28: qty 2

## 2018-08-28 MED ORDER — SODIUM CHLORIDE 0.9 % IV SOLN
2.0000 g | INTRAVENOUS | Status: DC
  Administered 2018-08-29 – 2018-08-31 (×3): 2 g via INTRAVENOUS
  Filled 2018-08-28: qty 20
  Filled 2018-08-28 (×2): qty 2
  Filled 2018-08-28: qty 20

## 2018-08-28 MED ORDER — MAGNESIUM HYDROXIDE 400 MG/5ML PO SUSP: 30.0000 mL | Freq: Every day | ORAL | Status: DC | PRN

## 2018-08-28 MED ORDER — LEVOTHYROXINE SODIUM 100 MCG PO TABS
150.0000 ug | ORAL_TABLET | Freq: Every day | ORAL | Status: DC
  Administered 2018-08-29 – 2018-08-31 (×3): 150 ug via ORAL
  Filled 2018-08-28 (×3): qty 2

## 2018-08-28 MED ORDER — TRAZODONE HCL 50 MG PO TABS
25.0000 mg | ORAL_TABLET | Freq: Every evening | ORAL | Status: DC | PRN
  Administered 2018-08-29: 25 mg via ORAL
  Filled 2018-08-28: qty 1

## 2018-08-28 MED ORDER — SODIUM CHLORIDE 0.9 % IV SOLN: 1.0000 g | Freq: Once | INTRAVENOUS | Status: DC

## 2018-08-28 MED ORDER — SODIUM CHLORIDE 0.9 % IV BOLUS
1000.0000 mL | Freq: Once | INTRAVENOUS | Status: AC
Start: 2018-08-28 — End: 2018-08-29
  Administered 2018-08-29: 1000 mL via INTRAVENOUS

## 2018-08-28 MED ORDER — VENLAFAXINE HCL ER 150 MG PO CP24
150.0000 mg | ORAL_CAPSULE | Freq: Every day | ORAL | Status: DC
  Administered 2018-08-29 – 2018-08-31 (×3): 150 mg via ORAL
  Filled 2018-08-28: qty 2
  Filled 2018-08-28: qty 1
  Filled 2018-08-28 (×2): qty 2
  Filled 2018-08-28 (×2): qty 1

## 2018-08-28 MED ORDER — HYDROCOD POLST-CPM POLST ER 10-8 MG/5ML PO SUER
5.0000 mL | Freq: Once | ORAL | Status: AC
  Administered 2018-08-28: 5 mL via ORAL
  Filled 2018-08-28: qty 5

## 2018-08-28 MED ORDER — ONDANSETRON HCL 4 MG PO TABS: 4.0000 mg | ORAL_TABLET | Freq: Four times a day (QID) | ORAL | Status: DC | PRN

## 2018-08-28 MED ORDER — AZITHROMYCIN 500 MG PO TABS
500.0000 mg | ORAL_TABLET | Freq: Once | ORAL | Status: AC
  Administered 2018-08-28: 500 mg via ORAL
  Filled 2018-08-28: qty 1

## 2018-08-28 NOTE — ED Triage Notes (Signed)
Pt to triage via wheelchair. Pt has a dry cough and fever today. Nonsmoker.  Pt seen here yesterday for bruising.  Pt alert speech clear.

## 2018-08-28 NOTE — ED Provider Notes (Signed)
[facility] Emergency Department Provider Note  ____________________________________________  Time seen: Approximately 10:18 PM  I have reviewed the triage vital signs and the nursing notes.   HISTORY  Chief Complaint Cough   HPI Jenna Tyler is a 53 y.o. female with a history of neurofibromatosis 1, hypertension, hypothyroidism, Guillain-Barr syndrome, asthma, COPD who presents for evaluation of cough, fever and bruises.  Patient reports over the last 4 days she has noted bruises on her arms and legs.  She does not take any blood thinners.  She has no prior history of bruising easily.  She does not recollect any trauma.  Today she started with a dry cough and a low-grade fever of 99 at home.  No chest pain or shortness of breath.  She has had a few episodes of vomiting and diarrhea as well.  No known exposures to COVID.  She is a former smoker.  No abdominal pain.  Patient denies any personal family history of PE or DVT, recent travel immobilization, leg pain or swelling, hemoptysis, or exogenous hormones.  Past Medical History:  Diagnosis Date  . Asthma   . Guillain Barr syndrome ([place])   . Hyperlipidemia   . Hypertension   . Hypothyroidism   . 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 Pancreatitis     Patient Active Problem List   Diagnosis Date Noted  . Weakness 09/16/2017  . AKI (acute kidney injury) ([place]) 07/12/2017  . HTN (hypertension) 12/29/2016  . COPD exacerbation ([place]) 12/29/2016  . Substance induced mood disorder ([place]) 12/29/2016  . Alcohol use disorder, severe, dependence ([place]) 12/27/2016  . Major depressive disorder, recurrent severe without psychotic features ([place]) 12/27/2016  . Prediabetes 07/07/2015  . Primary hypothyroidism 04/04/2015  . Pre-diabetes 04/04/2015  . Vitamin D deficiency 04/04/2015  . Anxiety 10/12/2014  . Anxiety, generalized 10/12/2014  . H/O: hypothyroidism 10/12/2014  . Insomnia, persistent  10/12/2014  . Cannot sleep 10/12/2014  . Depression, major, recurrent, moderate ([place]) 10/12/2014  . Drug abuse, opioid type ([place]) 10/12/2014  . 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) 10/12/2014  . 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/2013    Past Surgical History:  Procedure Laterality Date  . ABDOMINAL HYSTERECTOMY    . BREAST BIOPSY Right 2012   negative  . CHOLECYSTECTOMY    . 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 OTHER SURGICAL HISTORY     excision of lipoma  . OTHER SURGICAL HISTORY     Abdominal surgery  . THYROID SURGERY     thyroidectomy    Prior to Admission medications   Medication Sig Start Date End Date Taking? Authorizing Provider  albuterol (VENTOLIN HFA) 108 (90 Base) MCG/ACT inhaler Inhale 1-2 puffs into the lungs every 6 (six) hours as needed for wheezing or shortness of breath.     [provider]  hydrOXYzine (VISTARIL) 50 MG capsule Take 50 mg by mouth every 6 (six) hours as needed for anxiety.     [provider]  levothyroxine (SYNTHROID, LEVOTHROID) 150 MCG tablet Take 1 tablet (150 mcg total) by mouth daily. 07/16/17   Hillary Bow, MD  mometasone-formoterol (DULERA) 100-5 MCG/ACT AERO Inhale 2 puffs into the lungs 2 (two) times daily. 01/06/17   Pucilowska, Herma Ard B, MD  QUEtiapine (SEROQUEL) 200 MG tablet Take 1 tablet (200 mg total) by mouth at bedtime. 07/16/17   Hillary Bow, MD  QUEtiapine (SEROQUEL) 50 MG tablet Take 50 mg by mouth 3 (three) times daily.    [provider]  tiZANidine (  ZANAFLEX) 4 MG tablet Take 12 mg by mouth at bedtime.    [provider]  venlafaxine XR (EFFEXOR-XR) 150 MG 24 hr capsule Take 1 capsule (150 mg total) by mouth daily with breakfast. 01/07/17   Pucilowska, Wardell Honour, MD    Allergies Nsaids; Adhesive [tape]; Aspirin; and Penicillins  Family History  Problem Relation Age of Onset  . Breast cancer Sister 73  . Breast cancer Maternal Aunt 63  . Breast cancer Maternal Grandmother 60  .  Breast cancer Maternal Aunt 29    Social History Social History   Tobacco Use  . Smoking status: Former Smoker    Last attempt to quit: 12/28/1997    Years since quitting: 20.6  . Smokeless tobacco: Never Used  Substance Use Topics  . Alcohol use: Not Currently    Alcohol/week: 0.0 standard drinks  . Drug use: No    Review of Systems  Constitutional: + fever. Eyes: Negative for visual changes. ENT: Negative for sore throat. Neck: No neck pain  Cardiovascular: Negative for chest pain. Respiratory: Negative for shortness of breath. + cough Gastrointestinal: Negative for abdominal pain. + vomiting and diarrhea. Genitourinary: Negative for dysuria. Musculoskeletal: Negative for back pain. Skin: Negative for rash. + bruises Neurological: Negative for headaches, weakness or numbness. Psych: No SI or HI  ____________________________________________   PHYSICAL EXAM:  VITAL SIGNS: ED Triage Vitals  Enc Vitals Group     BP --      Pulse Rate 08/28/18 2047 (!) 107     Resp 08/28/18 2047 20     Temp 08/28/18 2047 99.5 F (37.5 C)     Temp Source 08/28/18 2047 Oral     SpO2 08/28/18 2047 95 %     Weight 08/28/18 2048 185 lb (83.9 kg)     Height 08/28/18 2048 5\' 5"  (1.651 m)     Head Circumference --      Peak Flow --      Pain Score 08/28/18 2048 8     Pain Loc --      Pain Edu? --      Excl. in [place]? --     Constitutional: Alert and oriented, patient actively coughing non stop in the room HEENT:      Head: Normocephalic and atraumatic.         Eyes: Conjunctivae are normal. Sclera is non-icteric.       Mouth/Throat: Mucous membranes are moist.       Neck: Supple with no signs of meningismus. Cardiovascular: Tachycardic with regular rhythm. No murmurs, gallops, or rubs. 2+ symmetrical distal pulses are present in all extremities. No JVD. Respiratory: Normal respiratory effort. Lungs are clear to auscultation bilaterally with faint expiratory wheezes.  Gastrointestinal:  Soft, non tender, and non distended with positive bowel sounds. No rebound or guarding. Musculoskeletal: Nontender with normal range of motion in all extremities. No edema, cyanosis, or erythema of extremities. Neurologic: Normal speech and language. Face is symmetric. Moving all extremities. No gross focal neurologic deficits are appreciated. Skin: Skin is warm, dry and intact. No rash noted.  Patient noted to have several bruises in all 4 extremities Psychiatric: Mood and affect are normal. Speech and behavior are normal.  ____________________________________________   LABS (all labs ordered are listed, but only abnormal results are displayed)  Labs Reviewed  CBC WITH DIFFERENTIAL/PLATELET - Abnormal; Notable for the following components:      Result Value   WBC 12.5 (*)    MCV 79.6 (*)  Neutro Abs 9.7 (*)    All other components within normal limits  COMPREHENSIVE METABOLIC PANEL - Abnormal; Notable for the following components:   CO2 19 (*)    Glucose, Bld 141 (*)    Calcium 8.4 (*)    Alkaline Phosphatase 146 (*)    All other components within normal limits  APTT - Abnormal; Notable for the following components:   aPTT 41 (*)    All other components within normal limits  SARS CORONAVIRUS 2 (HOSPITAL ORDER, [place] [facility])  CULTURE, BLOOD (ROUTINE X 2)  CULTURE, BLOOD (ROUTINE X 2)  TROPONIN I  PROTIME-INR  LACTIC ACID, PLASMA   ____________________________________________  EKG  ED ECG REPORT I, Rudene Re, the attending physician, personally viewed and interpreted this ECG.  Sinus tachycardia, rate of 113, low voltage QRS, normal intervals, no ST elevations or depressions.  Unchanged from prior. ____________________________________________  RADIOLOGY  I have personally reviewed the images performed during this visit and I agree with the Radiologist's read.   Interpretation by Radiologist:  Dg Chest Portable 1 View  Result Date:  08/28/2018 CLINICAL DATA:  Dry cough and fever EXAM: PORTABLE CHEST 1 VIEW COMPARISON:  07/11/2017 FINDINGS: There is hazy right upper lobe airspace disease. There is no pleural effusion or pneumothorax. The heart and mediastinal contours are unremarkable. The osseous structures are unremarkable. IMPRESSION: Hazy right upper lobe airspace disease concerning for pneumonia. Electronically Signed   By: Kathreen Devoid   On: 08/28/2018 21:50     ____________________________________________   PROCEDURES  Procedure(s) performed: None Procedures Critical Care performed: yes  CRITICAL CARE Performed by: Rudene Re  ?  Total critical care time: 35 min  Critical care time was exclusive of separately billable procedures and treating other patients.  Critical care was necessary to treat or prevent imminent or life-threatening deterioration.  Critical care was time spent personally by me on the following activities: development of treatment plan with patient and/or surrogate as well as nursing, discussions with consultants, evaluation of patient's response to treatment, examination of patient, obtaining history from patient or surrogate, ordering and performing treatments and interventions, ordering and review of laboratory studies, ordering and review of radiographic studies, pulse oximetry and re-evaluation of patient's condition.  ____________________________________________   INITIAL IMPRESSION / ASSESSMENT AND PLAN / ED COURSE  53 y.o. female with a history of neurofibromatosis 1, hypertension, hypothyroidism, Guillain-Barr syndrome, asthma, COPD who presents for evaluation of cough, fever and bruises.  Patient actively coughing nonstop with but normal work of breathing, normal sats.  She has a low-grade temp of 99.31F, tachycardic to the low 100s, faint wheezing bilaterally.  Patient was given 6 puffs of her personal albuterol on arrival, started on solumedrol. CXR concerning for PNA. Labs  showing leukocytosis with WBC 12.5. Lactic pending. Patient meets criteria for sepsis. Will start patient on Rocephin and azithromycin for community-acquired pneumonia, will give Tylenol for low-grade fever, will give IV fluids for tachycardia. Covid swab pending.     _________________________ 10:57 PM on 08/28/2018 -----------------------------------------  Patient now with new oxygen requirement of 2L [place]. Will admit to Hospitalist. Covid pending.    As part of my medical decision making, I reviewed the following data within the [place] notes reviewed and incorporated, Labs reviewed , EKG interpreted , Old EKG reviewed, Old chart reviewed, Radiograph reviewed , Discussed with admitting physician , Notes from prior ED visits and [place] Controlled Substance Database    Pertinent labs & imaging  results that were available during my care of the patient were reviewed by me and considered in my medical decision making (see chart for details).    ____________________________________________   FINAL CLINICAL IMPRESSION(S) / ED DIAGNOSES  Final diagnoses:  Community acquired pneumonia, unspecified laterality  Acute respiratory failure with hypoxia ([place])      NEW MEDICATIONS STARTED DURING THIS VISIT:  ED Discharge Orders    None       Note:  This document was prepared using Dragon voice recognition software and may include unintentional dictation errors.    Alfred Levins, [place], MD 08/28/18 [phone]

## 2018-08-28 NOTE — ED Notes (Signed)
ED TO INPATIENT HANDOFF REPORT  ED Nurse Name and Phone #: Gwynn Burly Name/Age/Gender Jenna Tyler 53 y.o. female Room/Bed: ED35A/ED35A  Code Status   Code Status: Full Code  Home/SNF/Other Home Patient oriented to: self, place, time and situation Is this baseline? Yes   Triage Complete: Triage complete  Chief Complaint Cough fever bruises  Triage Note Pt to triage via wheelchair. Pt has a dry cough and fever today. Nonsmoker.  Pt seen here yesterday for bruising.  Pt alert speech clear.    Allergies Allergies  Allergen Reactions  . Nsaids Other (See Comments)    Internal bleeding  Intestinal bleeding  . Adhesive [Tape] Rash  . Aspirin Other (See Comments)    Cannot take due to ulcers.  . Penicillins Rash    Has patient had a PCN reaction causing immediate rash, facial/tongue/throat swelling, SOB or lightheadedness with hypotension: Yes Has patient had a PCN reaction causing severe rash involving mucus membranes or skin necrosis: No Has patient had a PCN reaction that required hospitalization: No Has patient had a PCN reaction occurring within the last 10 years: Yes If all of the above answers are "NO", then may proceed with Cephalosporin use.     Level of Care/Admitting Diagnosis ED Disposition    ED Disposition Condition [facility] Area: [place] [[id number]]  Level of Care: Med-Surg [16]  Covid Evaluation: N/A  Diagnosis: CAP (community acquired pneumonia) [[id number]]  Admitting Physician: Christel Mormon [[id number]]  Attending Physician: Christel Mormon [[id number]]  Estimated length of stay: past midnight tomorrow  Certification:: I certify this patient will need inpatient services for at least 2 midnights  PT Class (Do Not Modify): Inpatient [101]  PT Acc Code (Do Not Modify): Private [1]       B Medical/Surgery History Past Medical History:  Diagnosis Date  . Asthma   . Guillain Barr syndrome ([place])   .  Hyperlipidemia   . Hypertension   . Hypothyroidism   . 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 Pancreatitis    Past Surgical History:  Procedure Laterality Date  . ABDOMINAL HYSTERECTOMY    . BREAST BIOPSY Right 2012   negative  . CHOLECYSTECTOMY    . 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 OTHER SURGICAL HISTORY     excision of lipoma  . OTHER SURGICAL HISTORY     Abdominal surgery  . THYROID SURGERY     thyroidectomy     A IV Location/Drains/Wounds Patient Lines/Drains/Airways Status   Active Line/Drains/Airways    Name:   Placement date:   Placement time:   Site:   Days:   Peripheral IV 08/28/18 Right Antecubital   08/28/18    2157    Antecubital   less than 1          Intake/Output Last 24 hours No intake or output data in the 24 hours ending 08/28/18 2354  Labs/Imaging Results for orders placed or performed during the hospital encounter of 08/28/18 (from the past 48 hour(s))  CBC with Differential/Platelet     Status: Abnormal   Collection Time: 08/28/18  9:51 PM  Result Value Ref Range   WBC 12.5 (H) 4.0 - 10.5 K/uL   RBC 4.81 3.87 - 5.11 MIL/uL   Hemoglobin 12.9 12.0 - 15.0 g/dL   HCT 38.3 36.0 - 46.0 %   MCV 79.6 (L) 80.0 - 100.0 fL   MCH 26.8 26.0 - 34.0 pg   MCHC  33.7 30.0 - 36.0 g/dL   RDW 14.3 11.5 - 15.5 %   Platelets 255 150 - 400 K/uL   nRBC 0.0 0.0 - 0.2 %   Neutrophils Relative % 76 %   Neutro Abs 9.7 (H) 1.7 - 7.7 K/uL   Lymphocytes Relative 13 %   Lymphs Abs 1.6 0.7 - 4.0 K/uL   Monocytes Relative 8 %   Monocytes Absolute 1.0 0.1 - 1.0 K/uL   Eosinophils Relative 1 %   Eosinophils Absolute 0.1 0.0 - 0.5 K/uL   Basophils Relative 1 %   Basophils Absolute 0.1 0.0 - 0.1 K/uL   Immature Granulocytes 1 %   Abs Immature Granulocytes 0.06 0.00 - 0.07 K/uL    Comment: Performed at [facility], [address] [place], [place] [address]  Comprehensive metabolic panel     Status: Abnormal   Collection Time: 08/28/18  9:51 PM   Result Value Ref Range   Sodium 138 135 - 145 mmol/L   Potassium 3.5 3.5 - 5.1 mmol/L   Chloride 108 98 - 111 mmol/L   CO2 19 (L) 22 - 32 mmol/L   Glucose, Bld 141 (H) 70 - 99 mg/dL   BUN 17 6 - 20 mg/dL   Creatinine, Ser 0.97 0.44 - 1.00 mg/dL   Calcium 8.4 (L) 8.9 - 10.3 mg/dL   Total Protein 6.8 6.5 - 8.1 g/dL   Albumin 3.5 3.5 - 5.0 g/dL   AST 25 15 - 41 U/L   ALT 38 0 - 44 U/L   Alkaline Phosphatase 146 (H) 38 - 126 U/L   Total Bilirubin 0.4 0.3 - 1.2 mg/dL   GFR calc non Af Amer >60 >60 mL/min   GFR calc Af Amer >60 >60 mL/min   Anion gap 11 5 - 15    Comment: Performed at [facility], [address] [place], [place] [address]  Troponin I - ONCE - STAT     Status: None   Collection Time: 08/28/18  9:51 PM  Result Value Ref Range   Troponin I <0.03 <0.03 ng/mL    Comment: Performed at [facility], [address] [place], [place] [address]  Protime-INR     Status: None   Collection Time: 08/28/18  9:51 PM  Result Value Ref Range   Prothrombin Time 13.2 11.4 - 15.2 seconds   INR 1.0 0.8 - 1.2    Comment: (NOTE) INR goal varies based on device and disease states. Performed at [facility], [address] [place], [place] [address]   APTT     Status: Abnormal   Collection Time: 08/28/18  9:51 PM  Result Value Ref Range   aPTT 41 (H) 24 - 36 seconds    Comment:        IF BASELINE aPTT IS ELEVATED, SUGGEST PATIENT RISK ASSESSMENT BE USED TO DETERMINE APPROPRIATE ANTICOAGULANT THERAPY. Performed at [facility], [address] [place], [place] [address]   SARS Coronavirus 2 (CEPHEID- Performed in [facility] hospital lab), Hosp Order     Status: None   Collection Time: 08/28/18  9:51 PM  Result Value Ref Range   SARS Coronavirus 2 NEGATIVE NEGATIVE    Comment: (NOTE) If result is NEGATIVE SARS-CoV-2 target nucleic acids are NOT DETECTED. The SARS-CoV-2 RNA is generally detectable in upper and lower  respiratory  specimens during the acute phase of infection. The lowest  concentration of SARS-CoV-2 viral copies this assay can detect is 250  copies /  mL. A negative result does not preclude SARS-CoV-2 infection  and should not be used as the sole basis for treatment or other  patient management decisions.  A negative result may occur with  improper specimen collection / handling, submission of specimen other  than nasopharyngeal swab, presence of viral mutation(s) within the  areas targeted by this assay, and inadequate number of viral copies  (<250 copies / mL). A negative result must be combined with clinical  observations, patient history, and epidemiological information. If result is POSITIVE SARS-CoV-2 target nucleic acids are DETECTED. The SARS-CoV-2 RNA is generally detectable in upper and lower  respiratory specimens dur ing the acute phase of infection.  Positive  results are indicative of active infection with SARS-CoV-2.  Clinical  correlation with patient history and other diagnostic information is  necessary to determine patient infection status.  Positive results do  not rule out bacterial infection or co-infection with other viruses. If result is PRESUMPTIVE POSTIVE SARS-CoV-2 nucleic acids MAY BE PRESENT.   A presumptive positive result was obtained on the submitted specimen  and confirmed on repeat testing.  While 2019 novel coronavirus  (SARS-CoV-2) nucleic acids may be present in the submitted sample  additional confirmatory testing may be necessary for epidemiological  and / or clinical management purposes  to differentiate between  SARS-CoV-2 and other Sarbecovirus currently known to infect humans.  If clinically indicated additional testing with an alternate test  methodology [phone]) is advised. The SARS-CoV-2 RNA is generally  detectable in upper and lower respiratory sp ecimens during the acute  phase of infection. The expected result is Negative. Fact Sheet for  Patients:  StrictlyIdeas.no Fact Sheet for Healthcare Providers: BankingDealers.co.za This test is not yet approved or cleared by the [place] FDA and has been authorized for detection and/or diagnosis of SARS-CoV-2 by FDA under an Emergency Use Authorization (EUA).  This EUA will remain in effect (meaning this test can be used) for the duration of the COVID-19 declaration under Section 564(b)(1) of the Act, 21 U.S.C. section 360bbb-3(b)(1), unless the authorization is terminated or revoked sooner. Performed at [facility], [address] [place], [place] [address]    Dg Chest Portable 1 View  Result Date: 08/28/2018 CLINICAL DATA:  Dry cough and fever EXAM: PORTABLE CHEST 1 VIEW COMPARISON:  07/11/2017 FINDINGS: There is hazy right upper lobe airspace disease. There is no pleural effusion or pneumothorax. The heart and mediastinal contours are unremarkable. The osseous structures are unremarkable. IMPRESSION: Hazy right upper lobe airspace disease concerning for pneumonia. Electronically Signed   By: Kathreen Devoid   On: 08/28/2018 21:50    Pending Labs Unresulted Labs (From admission, onward)    Start     Ordered   08/29/18 6144  Basic metabolic panel  Tomorrow morning,   STAT     08/28/18 2346   08/29/18 0500  CBC  Tomorrow morning,   STAT     08/28/18 2346   08/28/18 2341  HIV antibody (Routine Screening)  Once,   STAT     08/28/18 2346   08/28/18 2341  Culture, blood (routine x 2) Call MD if unable to obtain prior to antibiotics being given  BLOOD CULTURE X 2,   STAT    Comments:  If blood cultures drawn in Emergency Department - Do not draw and cancel order    08/28/18 2346   08/28/18 2341  Culture, sputum-assessment  Once,   STAT     08/28/18 2346   08/28/18  2341  Gram stain  Once,   STAT     08/28/18 2346   08/28/18 2341  Strep pneumoniae urinary antigen  Once,   STAT     08/28/18 2346   08/28/18 2341   Creatinine, serum  (enoxaparin (LOVENOX)    CrCl >/= 30 ml/min)  Once,   STAT    Comments:  Baseline for enoxaparin therapy IF NOT ALREADY DRAWN.    08/28/18 2346   08/28/18 2225  Lactic acid, plasma  ONCE - STAT,   STAT     08/28/18 2225   08/28/18 2225  Blood culture (routine x 2)  BLOOD CULTURE X 2,   STAT     08/28/18 2225          Vitals/Pain Today's Vitals   08/28/18 2047 08/28/18 2048  Pulse: (!) 107   Resp: 20   Temp: 99.5 F (37.5 C)   TempSrc: Oral   SpO2: 95%   Weight:  83.9 kg  Height:  5\' 5"  (1.651 m)  PainSc:  8     Isolation Precautions Droplet and Contact precautions  Medications Medications  cefTRIAXone (ROCEPHIN) 1 g in sodium chloride 0.9 % 100 mL IVPB (has no administration in time range)  sodium chloride 0.9 % bolus 1,000 mL (has no administration in time range)  hydrOXYzine (VISTARIL) capsule 50 mg (has no administration in time range)  QUEtiapine (SEROQUEL) tablet 200 mg (has no administration in time range)  QUEtiapine (SEROQUEL) tablet 50 mg (has no administration in time range)  venlafaxine XR (EFFEXOR-XR) 24 hr capsule 150 mg (has no administration in time range)  levothyroxine (SYNTHROID) tablet 150 mcg (has no administration in time range)  tiZANidine (ZANAFLEX) tablet 12 mg (has no administration in time range)  enoxaparin (LOVENOX) injection 40 mg (has no administration in time range)  0.9 %  sodium chloride infusion (has no administration in time range)  acetaminophen (TYLENOL) tablet 650 mg (has no administration in time range)    Or  acetaminophen (TYLENOL) suppository 650 mg (has no administration in time range)  traZODone (DESYREL) tablet 25 mg (has no administration in time range)  magnesium hydroxide (MILK OF MAGNESIA) suspension 30 mL (has no administration in time range)  ondansetron (ZOFRAN) tablet 4 mg (has no administration in time range)    Or  ondansetron (ZOFRAN) injection 4 mg (has no administration in time range)   cefTRIAXone (ROCEPHIN) 2 g in sodium chloride 0.9 % 100 mL IVPB (has no administration in time range)  azithromycin (ZITHROMAX) 500 mg in sodium chloride 0.9 % 250 mL IVPB (has no administration in time range)  guaiFENesin (MUCINEX) 12 hr tablet 600 mg (has no administration in time range)  ipratropium-albuterol (DUONEB) 0.5-2.5 (3) MG/3ML nebulizer solution 3 mL (has no administration in time range)  methylPREDNISolone sodium succinate (SOLU-MEDROL) 125 mg/2 mL injection 125 mg (125 mg Intravenous Given 08/28/18 2226)  chlorpheniramine-HYDROcodone (TUSSIONEX) 10-8 MG/5ML suspension 5 mL (5 mLs Oral Given 08/28/18 2225)  azithromycin (ZITHROMAX) tablet 500 mg (500 mg Oral Given 08/28/18 2339)    Mobility walks Moderate fall risk   Focused Assessments    R Recommendations: See Admitting Provider Note  Report given to:   Additional Notes: none

## 2018-08-29 ENCOUNTER — Other Ambulatory Visit: Payer: Self-pay

## 2018-08-29 LAB — BASIC METABOLIC PANEL
Anion gap: 9 (ref 5–15)
BUN: 16 mg/dL (ref 6–20)
CO2: 18 mmol/L — ABNORMAL LOW (ref 22–32)
Calcium: 7.8 mg/dL — ABNORMAL LOW (ref 8.9–10.3)
Chloride: 110 mmol/L (ref 98–111)
Creatinine, Ser: 0.82 mg/dL (ref 0.44–1.00)
GFR calc Af Amer: >60 mL/min (ref >60)
GFR calc non Af Amer: >60 mL/min (ref >60)
Glucose, Bld: 237 mg/dL — ABNORMAL HIGH (ref 70–99)
Potassium: 3.8 mmol/L (ref 3.5–5.1)
Sodium: 137 mmol/L (ref 135–145)

## 2018-08-29 LAB — CBC
HCT: 37.5 % (ref 36.0–46.0)
Hemoglobin: 12.4 g/dL (ref 12.0–15.0)
MCH: 27.2 pg (ref 26.0–34.0)
MCHC: 33.1 g/dL (ref 30.0–36.0)
MCV: 82.2 fL (ref 80.0–100.0)
Platelets: 223 10*3/uL (ref 150–400)
RBC: 4.56 MIL/uL (ref 3.87–5.11)
RDW: 14.4 % (ref 11.5–15.5)
WBC: 11.3 10*3/uL — ABNORMAL HIGH (ref 4.0–10.5)
nRBC: 0 % (ref 0.0–0.2)

## 2018-08-29 LAB — BLOOD CULTURE ID PANEL (REFLEXED)

## 2018-08-29 LAB — TSH: TSH: 1.168 u[IU]/mL (ref 0.350–4.500)

## 2018-08-29 LAB — LACTIC ACID, PLASMA: Lactic Acid, Venous: 1 mmol/L (ref 0.5–1.9)

## 2018-08-29 MED ORDER — SODIUM CHLORIDE 0.9 % IV SOLN
INTRAVENOUS | Status: DC | PRN
  Administered 2018-08-29: 250 mL via INTRAVENOUS

## 2018-08-29 MED ORDER — GUAIFENESIN-DM 100-10 MG/5ML PO SYRP
5.0000 mL | ORAL_SOLUTION | ORAL | Status: DC | PRN
  Administered 2018-08-29: 5 mL via ORAL
  Filled 2018-08-29: qty 5

## 2018-08-29 MED ORDER — METHYLPREDNISOLONE 4 MG PO TBPK
4.0000 mg | ORAL_TABLET | ORAL | Status: AC
  Administered 2018-08-29: 4 mg via ORAL

## 2018-08-29 MED ORDER — QUETIAPINE FUMARATE 25 MG PO TABS
25.0000 mg | ORAL_TABLET | Freq: Three times a day (TID) | ORAL | Status: DC
  Administered 2018-08-29 – 2018-08-31 (×5): 25 mg via ORAL
  Filled 2018-08-29 (×5): qty 1

## 2018-08-29 MED ORDER — IPRATROPIUM-ALBUTEROL 0.5-2.5 (3) MG/3ML IN SOLN
3.0000 mL | Freq: Four times a day (QID) | RESPIRATORY_TRACT | Status: DC | PRN
  Administered 2018-08-29: 3 mL via RESPIRATORY_TRACT
  Filled 2018-08-29: qty 3

## 2018-08-29 MED ORDER — METHYLPREDNISOLONE 4 MG PO TBPK
4.0000 mg | ORAL_TABLET | Freq: Four times a day (QID) | ORAL | Status: DC
  Administered 2018-08-31 (×2): 4 mg via ORAL

## 2018-08-29 MED ORDER — POTASSIUM CHLORIDE CRYS ER 20 MEQ PO TBCR
40.0000 meq | EXTENDED_RELEASE_TABLET | Freq: Once | ORAL | Status: AC
  Administered 2018-08-29: 40 meq via ORAL
  Filled 2018-08-29: qty 2

## 2018-08-29 MED ORDER — METHYLPREDNISOLONE 4 MG PO TBPK
8.0000 mg | ORAL_TABLET | Freq: Every evening | ORAL | Status: AC
  Administered 2018-08-29: 8 mg via ORAL

## 2018-08-29 MED ORDER — QUETIAPINE FUMARATE 25 MG PO TABS
450.0000 mg | ORAL_TABLET | Freq: Every day | ORAL | Status: DC
  Administered 2018-08-29 – 2018-08-30 (×2): 450 mg via ORAL
  Filled 2018-08-29 (×3): qty 2

## 2018-08-29 MED ORDER — ROPINIROLE HCL 1 MG PO TABS
0.5000 mg | ORAL_TABLET | Freq: Every day | ORAL | Status: DC
  Administered 2018-08-29 – 2018-08-30 (×3): 0.5 mg via ORAL
  Filled 2018-08-29 (×3): qty 1

## 2018-08-29 MED ORDER — METHYLPREDNISOLONE 4 MG PO TBPK
8.0000 mg | ORAL_TABLET | Freq: Every morning | ORAL | Status: AC
  Administered 2018-08-29: 8 mg via ORAL
  Filled 2018-08-29: qty 21

## 2018-08-29 MED ORDER — TIZANIDINE HCL 4 MG PO TABS
8.0000 mg | ORAL_TABLET | Freq: Every day | ORAL | Status: DC
  Administered 2018-08-29 – 2018-08-30 (×2): 8 mg via ORAL
  Filled 2018-08-29 (×3): qty 2

## 2018-08-29 MED ORDER — METHYLPREDNISOLONE 4 MG PO TBPK
8.0000 mg | ORAL_TABLET | Freq: Every evening | ORAL | Status: AC
  Administered 2018-08-30: 8 mg via ORAL

## 2018-08-29 MED ORDER — METHYLPREDNISOLONE 4 MG PO TBPK
4.0000 mg | ORAL_TABLET | Freq: Three times a day (TID) | ORAL | Status: AC
  Administered 2018-08-30 (×3): 4 mg via ORAL

## 2018-08-29 MED ORDER — HYDROCOD POLST-CPM POLST ER 10-8 MG/5ML PO SUER
5.0000 mL | Freq: Two times a day (BID) | ORAL | Status: DC
  Administered 2018-08-29 – 2018-08-31 (×5): 5 mL via ORAL
  Filled 2018-08-29 (×5): qty 5

## 2018-08-29 MED ORDER — BENZONATATE 100 MG PO CAPS
100.0000 mg | ORAL_CAPSULE | Freq: Three times a day (TID) | ORAL | Status: DC | PRN
  Administered 2018-08-29 – 2018-08-30 (×2): 100 mg via ORAL
  Filled 2018-08-29 (×2): qty 1

## 2018-08-29 MED ORDER — IPRATROPIUM-ALBUTEROL 0.5-2.5 (3) MG/3ML IN SOLN
3.0000 mL | Freq: Two times a day (BID) | RESPIRATORY_TRACT | Status: DC
  Administered 2018-08-29 – 2018-08-31 (×4): 3 mL via RESPIRATORY_TRACT
  Filled 2018-08-29 (×3): qty 3

## 2018-08-29 MED ORDER — OXYCODONE-ACETAMINOPHEN 5-325 MG PO TABS
1.0000 | ORAL_TABLET | ORAL | Status: DC | PRN
  Administered 2018-08-30 – 2018-08-31 (×6): 1 via ORAL
  Filled 2018-08-29 (×6): qty 1

## 2018-08-29 NOTE — H&P (Addendum)
[facility] [place] NAME: Jenna Tyler    MR#:  [id number]  DATE OF BIRTH:  June 15, 1965  DATE OF ADMISSION:  08/28/2018  PRIMARY CARE PHYSICIAN: [facility]   REQUESTING/REFERRING PHYSICIAN: Gonzella Lex, MD CHIEF COMPLAINT:   Chief Complaint  Patient presents with   Cough  The patient was seen and examined on 08/28/2018  HISTORY OF PRESENT ILLNESS:  Jenna Tyler  is a 53 y.o. Caucasian female with a known history of multiple medical problems that will be mentioned below, who presented to the emergency room with a consider of dry cough with associated dyspnea and occasional wheezing as well as low-grade fever that was up to 99.4 that started today.  She denied any chills.  No chest pain or palpitations.  No rhinorrhea or nasal congestion or sore throat or earache.  No dysuria, oliguria or hematuria or flank pain.  No headache or dizziness or blurred vision.  She has been having bilateral leg pain with restless leg lately.  She also noticed indurated patches over her arms and thighs over the last month.  No new medications except Neurontin that she started about a month ago and no new detergents or lotions or soaps.  No recent sick exposure or exposure to large crowds.  No recent travels out of the state or out of the country or to a COVID-19 epicenter.  Upon presentation to the emergency room her vital signs were within normal but later her temperature was 99.5 with a pulse of 107.  Pulse oximetry was 92% on room air and later 95 and 94% on 2 L of O2 by nasal cannula.  Labs revealed borderline potassium of 3.5 and CO2 of 19 with lactic acid of 1, troponin I less than 0.03 and leukocytosis of 12.5 with 9.7 absolute neutrophils.  Her COVID 19 test came back negative.  Portable chest x-ray showed hazy right upper lobe opacity.  EKG showed sinus tachycardia with a rate of 113 with possible left atrial enlargement, and  low voltage QRS.  The patient was given IV Rocephin and Zithromax as well as p.o. Tussionex.  She will be admitted to a medically monitored bed for further evaluation and management.   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0 standard drinks    FAMILY HISTORY:   Family History  Problem Relation Age of Onset   Breast cancer Sister 69   Breast cancer Maternal Aunt 17   Breast cancer Maternal Grandmother 65  Breast cancer Maternal Aunt 40    DRUG ALLERGIES:   Allergies  Allergen Reactions   Nsaids Other (See Comments)    Internal bleeding  Intestinal bleeding   Adhesive [Tape] Rash   Aspirin Other (See Comments)    Cannot take due to ulcers.   Penicillins Rash    Has patient had a PCN reaction causing immediate rash, facial/tongue/throat swelling, SOB or lightheadedness with hypotension: Yes Has patient had a PCN reaction causing severe rash involving mucus membranes or skin necrosis: No Has patient had a PCN reaction that required hospitalization: No Has patient had a PCN reaction occurring within the last 10 years: Yes If all of the above answers are "NO", then may  proceed with Cephalosporin use.     REVIEW OF SYSTEMS:   ROS As per history of present illness. All pertinent systems were reviewed above. Constitutional,  HEENT, cardiovascular, respiratory, GI, GU, musculoskeletal, neuro, psychiatric, endocrine,  integumentary and hematologic systems were reviewed and are otherwise  negative/unremarkable except for positive findings mentioned above in the HPI.   MEDICATIONS AT HOME:   Prior to Admission medications   Medication Sig Start Date End Date Taking? Authorizing Provider  albuterol (VENTOLIN HFA) 108 (90 Base) MCG/ACT inhaler Inhale 1-2 puffs into the lungs every 6 (six) hours as needed for wheezing or shortness of breath.     [provider]  hydrOXYzine (VISTARIL) 50 MG capsule Take 50 mg by mouth every 6 (six) hours as needed for anxiety.     [provider]  levothyroxine (SYNTHROID, LEVOTHROID) 150 MCG tablet Take 1 tablet (150 mcg total) by mouth daily. 07/16/17   Hillary Bow, MD  mometasone-formoterol (DULERA) 100-5 MCG/ACT AERO Inhale 2 puffs into the lungs 2 (two) times daily. 01/06/17   Pucilowska, Herma Ard B, MD  QUEtiapine (SEROQUEL) 200 MG tablet Take 1 tablet (200 mg total) by mouth at bedtime. 07/16/17   Hillary Bow, MD  QUEtiapine (SEROQUEL) 50 MG tablet Take 50 mg by mouth 3 (three) times daily.    [provider]  tiZANidine (ZANAFLEX) 4 MG tablet Take 12 mg by mouth at bedtime.    [provider]  venlafaxine XR (EFFEXOR-XR) 150 MG 24 hr capsule Take 1 capsule (150 mg total) by mouth daily with breakfast. 01/07/17   Pucilowska, Jolanta B, MD      VITAL SIGNS:  Blood pressure 136/77, pulse 96, temperature 98.7 F (37.1 C), temperature source Oral, resp. rate 20, height 5\' 5"  (1.651 m), weight 83.9 kg, SpO2 94 %.  PHYSICAL EXAMINATION:  Physical Exam  GENERAL:  53 y.o.-year-old Caucasian female  patient lying in the bed with mild respiratory distress with conversational  dyspnea. EYES: Pupils equal, round, reactive to light and accommodation. No scleral icterus. Extraocular muscles intact.  HEENT: Head atraumatic, normocephalic. Oropharynx and nasopharynx clear.  NECK:  Supple, no jugular venous distention. No thyroid enlargement, no tenderness.  LUNGS: Slightly diminished bibasilar breath sounds with right basal crackles CARDIOVASCULAR: Regular rate and rhythm, S1, S2 normal. No murmurs, rubs, or gallops.  ABDOMEN: Soft, nondistended, nontender. Bowel sounds present. No organomegaly or mass.  EXTREMITIES: No pedal edema, cyanosis, or clubbing.  Negative Homans sign. NEUROLOGIC: Cranial nerves II through XII are intact. Muscle strength 5/5 in all extremities. Sensation intact. Gait not checked.  PSYCHIATRIC: The patient is alert and oriented x 3.  Normal affect and good eye contact. SKIN:   Bilateral upper and lower extremity ecchymotic patches mainly involving both arms and thighs.   LABORATORY PANEL:  CBC Recent Labs  Lab 08/28/18 2151  WBC 12.5*  HGB 12.9  HCT 38.3  PLT 255   ------------------------------------------------------------------------------------------------------------------  Chemistries  Recent Labs  Lab 08/28/18 2151  NA 138  K 3.5  CL 108  CO2 19*  GLUCOSE 141*  BUN 17  CREATININE 0.97  CALCIUM 8.4*  AST 25  ALT 38  ALKPHOS 146*  BILITOT 0.4   ------------------------------------------------------------------------------------------------------------------  Cardiac Enzymes Recent Labs  Lab 08/28/18 2151  TROPONINI <0.03   ------------------------------------------------------------------------------------------------------------------  RADIOLOGY:  Dg Chest Portable 1 View  Result Date: 08/28/2018 CLINICAL DATA:  Dry cough and fever EXAM: PORTABLE CHEST 1 VIEW COMPARISON:  07/11/2017 FINDINGS: There is hazy right upper lobe airspace disease. There is no pleural effusion or pneumothorax. The heart and  mediastinal contours are unremarkable. The osseous structures are unremarkable. IMPRESSION: Hazy right upper lobe airspace disease concerning for pneumonia. Electronically Signed   By: Kathreen Devoid   On: 08/28/2018 21:50      IMPRESSION AND PLAN:   #1.  Community-acquired pneumonia.  The patient will be admitted to a medically monitored bed for community-acquired pneumonia and will be placed on IV Rocephin and Zithromax.  Mucolytic therapy be provided as well as duo nebs q.i.d. and q.4 hours p.r.n.Marland Kitchen  Sputum Gram stain culture and sensitivity will be obtained.  2.  Suspected restless leg syndrome.  We will start her on p.o. Requip.  Pain management will be provided.  3.  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  We will stop her Neurontin for now.  Will add Medrol Dosepak.  4.  Hypothyroidism.  We will continue Synthroid and check TSH.  5.  Dyslipidemia.  Will continue statin therapy.  6.  Depression.  Seroquel, Effexor XR and trazodone will be resumed.  7.  DVT prophylaxis.  Provided with subcutaneous Lovenox   All the records are reviewed and case discussed with ED provider. The plan of care was discussed in details with the patient (and family). I answered all questions. The patient agreed to proceed with the above mentioned plan. Further management will depend upon hospital course.   CODE STATUS: Full code  TOTAL TIME TAKING CARE OF THIS PATIENT: 50 minutes.    Christel Mormon M.D on 08/29/2018 at 1:41 AM  Pager - [phone]  After 6pm go to [email or web address] - Technical brewer [facility]  Office  [phone]  CC: Primary care physician; [facility]   Note: This dictation was prepared with Dragon dictation along with smaller phrase technology. Any transcriptional errors that result from this process are unintentional.

## 2018-08-29 NOTE — Plan of Care (Signed)

## 2018-08-29 NOTE — Progress Notes (Signed)
[facility] [place] NAME: Jenna Tyler    MR#:  [id number]  DATE OF BIRTH:  July 18, 1965  SUBJECTIVE:  CHIEF COMPLAINT: Patient shortness of breath is better but cough is bothering her asking for strong cough medicine  REVIEW OF SYSTEMS:  CONSTITUTIONAL: No fever, fatigue or weakness.  EYES: No blurred or double vision.  EARS, NOSE, AND THROAT: No tinnitus or ear pain.  RESPIRATORY: Lots of cough, improving shortness of breath, denies wheezing or hemoptysis.  CARDIOVASCULAR: No chest pain, orthopnea, edema.  GASTROINTESTINAL: No nausea, vomiting, diarrhea or abdominal pain.  GENITOURINARY: No dysuria, hematuria.  ENDOCRINE: No polyuria, nocturia,  HEMATOLOGY: No anemia, easy bruising or bleeding SKIN: No rash or lesion. MUSCULOSKELETAL: No joint pain or arthritis.   NEUROLOGIC: No tingling, numbness, weakness.  PSYCHIATRY: No anxiety or depression.   DRUG ALLERGIES:   Allergies  Allergen Reactions  . Nsaids Other (See Comments)    Internal bleeding  Intestinal bleeding  . Adhesive [Tape] Rash  . Aspirin Other (See Comments)    Cannot take due to ulcers.  . Penicillins Rash    Has patient had a PCN reaction causing immediate rash, facial/tongue/throat swelling, SOB or lightheadedness with hypotension: Yes Has patient had a PCN reaction causing severe rash involving mucus membranes or skin necrosis: No Has patient had a PCN reaction that required hospitalization: No Has patient had a PCN reaction occurring within the last 10 years: Yes If all of the above answers are "NO", then may proceed with Cephalosporin use.     VITALS:  Blood pressure 112/70, pulse 89, temperature 98 F (36.7 C), temperature source Oral, resp. rate 16, height 5' (1.524 m), weight 81.4 kg, SpO2 96 %.  PHYSICAL EXAMINATION:  GENERAL:  53 y.o.-year-old patient lying in the bed with no acute distress.  EYES: Pupils equal, round, reactive to light and  accommodation. No scleral icterus. Extraocular muscles intact.  HEENT: Head atraumatic, normocephalic. Oropharynx and nasopharynx clear.  NECK:  Supple, no jugular venous distention. No thyroid enlargement, no tenderness.  LUNGS: Moderate breath sounds bilaterally, no wheezing, rales,rhonchi or crepitation. No use of accessory muscles of respiration.  CARDIOVASCULAR: S1, S2 normal. No murmurs, rubs, or gallops.  ABDOMEN: Soft, nontender, nondistended. Bowel sounds present.  EXTREMITIES: No pedal edema, cyanosis, or clubbing.  NEUROLOGIC: Cranial nerves II through XII are intact.  Sensation intact. Gait not checked.  PSYCHIATRIC: The patient is alert and oriented x 3.  SKIN: No obvious rash, lesion, or ulcer.    LABORATORY PANEL:   CBC Recent Labs  Lab 08/29/18 0527  WBC 11.3*  HGB 12.4  HCT 37.5  PLT 223   ------------------------------------------------------------------------------------------------------------------  Chemistries  Recent Labs  Lab 08/28/18 2151 08/29/18 0527  NA 138 137  K 3.5 3.8  CL 108 110  CO2 19* 18*  GLUCOSE 141* 237*  BUN 17 16  CREATININE 0.97 0.82  CALCIUM 8.4* 7.8*  AST 25  --   ALT 38  --   ALKPHOS 146*  --   BILITOT 0.4  --    ------------------------------------------------------------------------------------------------------------------  Cardiac Enzymes Recent Labs  Lab 08/28/18 2151  TROPONINI <0.03   ------------------------------------------------------------------------------------------------------------------  RADIOLOGY:  Dg Chest Portable 1 View  Result Date: 08/28/2018 CLINICAL DATA:  Dry cough and fever EXAM: PORTABLE CHEST 1 VIEW COMPARISON:  07/11/2017 FINDINGS: There is hazy right upper lobe airspace disease. There is no pleural effusion or pneumothorax. The heart and mediastinal contours are unremarkable. The osseous structures are  unremarkable. IMPRESSION: Hazy right upper lobe airspace disease concerning for  pneumonia. Electronically Signed   By: Kathreen Devoid   On: 08/28/2018 21:50    EKG:   Orders placed or performed during the hospital encounter of 08/28/18  . ED EKG  . ED EKG  . EKG 12-Lead  . EKG 12-Lead  . EKG 12-Lead  . EKG 12-Lead    ASSESSMENT AND PLAN:    #1.  Community-acquired pneumonia.  Continue IV Rocephin and Zithromax.  Mucolytic therapy be provided as well as duo nebs q.i.d. and q.4 hours p.r.n.Marland Kitchen Sputum Gram stain culture and sensitivity will be obtained if patient can provide a sample Tussionex Tessalon Perles  2.  Suspected restless leg syndrome.  Patient needs outpatient restless leg syndrome work-up  p.o. Requip started empirically.  Pain management will be provided.  3.  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  We discontinued  her Neurontin for now.   Medrol Dosepak  4.  Hypothyroidism.  We will continue Synthroid  TSH nml  5.  Dyslipidemia.  Will continue statin therapy.  6.  Depression.  Seroquel, Effexor XR and trazodone will be resumed.  7.  DVT prophylaxis.  Provided with subcutaneous Lovenox    All the records are reviewed and case discussed with Care Management/Social Workerr. Management plans discussed with the patient she is  in agreement.  CODE STATUS: fc   TOTAL TIME TAKING CARE OF THIS PATIENT: 36 minutes.   POSSIBLE D/C IN 2  DAYS, DEPENDING ON CLINICAL CONDITION.  Note: This dictation was prepared with Dragon dictation along with smaller phrase technology. Any transcriptional errors that result from this process are unintentional.   Nicholes Mango M.D on 08/29/2018 at 3:23 PM  Between 7am to 6pm - Pager - [phone] After 6pm go to [email or web address] - password [email or web address] [facility]  [facility]  Office  [phone]  CC: Primary care physician; [facility]

## 2018-08-29 NOTE — Progress Notes (Signed)
CODE SEPSIS - PHARMACY COMMUNICATION  **[facility] Antibiotics should be administered within 1 hour of Sepsis diagnosis**  Time Code Sepsis Called/Page Received: 2259  Antibiotics Ordered: azithro/ceftriaxone  Time of 1st antibiotic administration: 2339  Additional action taken by pharmacy:   If necessary, Name of Provider/Nurse Contacted:     Tobie Lords ,PharmD Clinical Pharmacist  08/29/2018  12:45 AM

## 2018-08-29 NOTE — Progress Notes (Signed)
Pt coughing nonstop. Primary nurse paged and spoke to [facility]. Orders received for Duonebs along with Tussalon. Primary nurse to continue to monitor.

## 2018-08-29 NOTE — Progress Notes (Signed)
PHARMACY - PHYSICIAN COMMUNICATION CRITICAL VALUE ALERT - BLOOD CULTURE IDENTIFICATION (BCID)  Results for orders placed or performed during the hospital encounter of 08/28/18  Blood Culture ID Panel (Reflexed) (Collected: 08/28/2018 11:47 PM)  Result Value Ref Range   Enterococcus species NOT DETECTED NOT DETECTED   Listeria monocytogenes NOT DETECTED NOT DETECTED   Staphylococcus species DETECTED (A) NOT DETECTED   Staphylococcus aureus (BCID) NOT DETECTED NOT DETECTED   Methicillin resistance NOT DETECTED NOT DETECTED   Streptococcus species NOT DETECTED NOT DETECTED   Streptococcus agalactiae NOT DETECTED NOT DETECTED   Streptococcus pneumoniae NOT DETECTED NOT DETECTED   Streptococcus pyogenes NOT DETECTED NOT DETECTED   Acinetobacter baumannii NOT DETECTED NOT DETECTED   Enterobacteriaceae species NOT DETECTED NOT DETECTED   Enterobacter cloacae complex NOT DETECTED NOT DETECTED   Escherichia coli NOT DETECTED NOT DETECTED   Klebsiella oxytoca NOT DETECTED NOT DETECTED   Klebsiella pneumoniae NOT DETECTED NOT DETECTED   Proteus species NOT DETECTED NOT DETECTED   Serratia marcescens NOT DETECTED NOT DETECTED   Haemophilus influenzae NOT DETECTED NOT DETECTED   Neisseria meningitidis NOT DETECTED NOT DETECTED   Pseudomonas aeruginosa NOT DETECTED NOT DETECTED   Candida albicans NOT DETECTED NOT DETECTED   Candida glabrata NOT DETECTED NOT DETECTED   Candida krusei NOT DETECTED NOT DETECTED   Candida parapsilosis NOT DETECTED NOT DETECTED   Candida tropicalis NOT DETECTED NOT DETECTED    Name of physician (or Provider) Contacted: [facility]   Changes to prescribed antibiotics required:  No, will continue current abx regimen.  Fahima Cifelli D 08/29/2018  8:53 PM

## 2018-08-29 NOTE — TOC Initial Note (Signed)
Transition of Care [facility]) - Initial/Assessment Note    Patient Details  Name: Jenna Tyler MRN: [id number] Date of Birth: 05-Dec-1965  [facility]) CM/SW Contact:    Beverly Sessions, RN Phone Number: 08/29/2018, 4:13 PM  Clinical Narrative:                 Patient admitted from home with PNA.  Patient states that she is separated from her husband, and splits her time staying at her daughter's home, and her mother's home.   PCP Dr Earma Reading at [facility]. Patient denies issues obtaining her medications.   Patient states that she does not have a car of her own.  States her daughter provides transportation or she borrows her daughters car,  Patient currently requiring acute O2 RNCM following for needs.   Expected Discharge Plan: [place]     Patient Goals and CMS Choice        Expected Discharge Plan and Services Expected Discharge Plan: [place]       Living arrangements for the past 2 months: Single Family Home Expected Discharge Date: 08/31/18                                    Prior Living Arrangements/Services Living arrangements for the past 2 months: Single Family Home Lives with:: Relatives Patient language and need for interpreter reviewed:: No Do you feel safe going back to the place where you live?: Yes      Need for Family Participation in Patient Care: No (Comment) Care giver support system in place?: Yes (comment)      Activities of Daily Living Home Assistive Devices/Equipment: Other (Comment)(Cane) ADL Screening (condition at time of admission) Patient's cognitive ability adequate to safely complete daily activities?: Yes Is the patient deaf or have difficulty hearing?: No Does the patient have difficulty seeing, even when wearing glasses/contacts?: No Does the patient have difficulty concentrating, remembering, or making decisions?: No Patient able to express need  for assistance with ADLs?: Yes Does the patient have difficulty dressing or bathing?: No Independently performs ADLs?: Yes (appropriate for developmental age) Does the patient have difficulty walking or climbing stairs?: No Weakness of Legs: None Weakness of Arms/Hands: None  Permission Sought/Granted                  Emotional Assessment Appearance:: Appears stated age Attitude/Demeanor/Rapport: Gracious Affect (typically observed): Accepting Orientation: : Oriented to Self, Oriented to Place, Oriented to  Time, Oriented to Situation   Psych Involvement: No (comment)  Admission diagnosis:  Acute respiratory failure with hypoxia ([place]) [J96.01] Community acquired pneumonia, unspecified laterality [J18.9] Patient Active Problem List   Diagnosis Date Noted  . CAP (community acquired pneumonia) 08/28/2018  . Weakness 09/16/2017  . AKI (acute kidney injury) ([place]) 07/12/2017  . HTN (hypertension) 12/29/2016  . COPD exacerbation ([place]) 12/29/2016  . Substance induced mood disorder ([place]) 12/29/2016  . Alcohol use disorder, severe, dependence ([place]) 12/27/2016  . 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 Prediabetes 07/07/2015  . Primary hypothyroidism 04/04/2015  . Pre-diabetes 04/04/2015  . Vitamin D deficiency 04/04/2015  . Anxiety 10/12/2014  . Anxiety, generalized 10/12/2014  . H/O: hypothyroidism 10/12/2014  . Insomnia, persistent 10/12/2014  . Cannot sleep 10/12/2014  . Depression, major, recurrent, moderate ([place]) 10/12/2014  . Drug abuse, opioid type ([place])  10/12/2014  . 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/2013   PCP:  [facility]:   [id number] [address], [address] [place] [address] Phone: [phone] Fax: [place], [place] - [address] [place] [address] Phone: [phone] Fax: [phone]  [facility] [place], [place] [address] AT [address] [place] [address] Phone: [phone] Fax: [place], [place] - [address] AT [facility] [address] [place] [address] Phone: [phone] Fax: [phone]     Social Determinants of Health ([place]) Interventions    Readmission Risk Interventions Readmission Risk Prevention Plan 08/29/2018  Transportation Screening Complete  Medication Review Press photographer) Complete  Some recent data might be hidden

## 2018-08-30 LAB — HIV ANTIBODY (ROUTINE TESTING W REFLEX): HIV Screen 4th Generation wRfx: NONREACTIVE

## 2018-08-30 NOTE — Progress Notes (Signed)
[facility] NAME: Jenna Tyler    MR#:  [id number]  DATE OF BIRTH:  [birth year]  SUBJECTIVE:  CHIEF COMPLAINT: Patient cough is better but reporting exertional dyspnea  Still on 2 L of oxygen, on room air at home REVIEW OF SYSTEMS:  CONSTITUTIONAL: No fever, fatigue or weakness.  EYES: No blurred or double vision.  EARS, NOSE, AND THROAT: No tinnitus or ear pain.  RESPIRATORY: Lots of cough, improving shortness of breath, denies wheezing or hemoptysis.  CARDIOVASCULAR: No chest pain, orthopnea, edema.  GASTROINTESTINAL: No nausea, vomiting, diarrhea or abdominal pain.  GENITOURINARY: No dysuria, hematuria.  ENDOCRINE: No polyuria, nocturia,  HEMATOLOGY: No anemia, easy bruising or bleeding SKIN: No rash or lesion. MUSCULOSKELETAL: No joint pain or arthritis.   NEUROLOGIC: No tingling, numbness, weakness.  PSYCHIATRY: No anxiety or depression.   DRUG ALLERGIES:   Allergies  Allergen Reactions  . Nsaids Other (See Comments)    Internal bleeding  Intestinal bleeding  . Adhesive [Tape] Rash  . Aspirin Other (See Comments)    Cannot take due to ulcers.  . Penicillins Rash    Has patient had a PCN reaction causing immediate rash, facial/tongue/throat swelling, SOB or lightheadedness with hypotension: Yes Has patient had a PCN reaction causing severe rash involving mucus membranes or skin necrosis: No Has patient had a PCN reaction that required hospitalization: No Has patient had a PCN reaction occurring within the last 10 years: Yes If all of the above answers are "NO", then may proceed with Cephalosporin use.     VITALS:  Blood pressure (!) 134/99, pulse 84, temperature 97.8 F (36.6 C), temperature source Oral, resp. rate 19, height 5' (1.524 m), weight 81.4 kg, SpO2 96 %.  PHYSICAL EXAMINATION:  GENERAL:  53 y.o.-year-old patient lying in the bed with no acute distress.  EYES: Pupils equal, round, reactive to  light and accommodation. No scleral icterus. Extraocular muscles intact.  HEENT: Head atraumatic, normocephalic. Oropharynx and nasopharynx clear.  NECK:  Supple, no jugular venous distention. No thyroid enlargement, no tenderness.  LUNGS: Moderate breath sounds bilaterally, no wheezing, rales,rhonchi or crepitation. No use of accessory muscles of respiration.  CARDIOVASCULAR: S1, S2 normal. No murmurs, rubs, or gallops.  ABDOMEN: Soft, nontender, nondistended. Bowel sounds present.  EXTREMITIES: No pedal edema, cyanosis, or clubbing.  NEUROLOGIC: Cranial nerves II through XII are intact.  Sensation intact. Gait not checked.  PSYCHIATRIC: The patient is alert and oriented x 3.  SKIN: No obvious rash, lesion, or ulcer.    LABORATORY PANEL:   CBC Recent Labs  Lab 08/29/18 0527  WBC 11.3*  HGB 12.4  HCT 37.5  PLT 223   ------------------------------------------------------------------------------------------------------------------  Chemistries  Recent Labs  Lab 08/28/18 2151 08/29/18 0527  NA 138 137  K 3.5 3.8  CL 108 110  CO2 19* 18*  GLUCOSE 141* 237*  BUN 17 16  CREATININE 0.97 0.82  CALCIUM 8.4* 7.8*  AST 25  --   ALT 38  --   ALKPHOS 146*  --   BILITOT 0.4  --    ------------------------------------------------------------------------------------------------------------------  Cardiac Enzymes Recent Labs  Lab 08/28/18 2151  TROPONINI <0.03   ------------------------------------------------------------------------------------------------------------------  RADIOLOGY:  Dg Chest Portable 1 View  Result Date: 08/28/2018 CLINICAL DATA:  Dry cough and fever EXAM: PORTABLE CHEST 1 VIEW COMPARISON:  07/11/2017 FINDINGS: There is hazy right upper lobe airspace disease. There is no pleural effusion or pneumothorax. The heart and mediastinal contours are unremarkable.  The osseous structures are unremarkable. IMPRESSION: Hazy right upper lobe airspace disease concerning  for pneumonia. Electronically Signed   By: Kathreen Devoid   On: 08/28/2018 21:50    EKG:   Orders placed or performed during the hospital encounter of 08/28/18  . ED EKG  . ED EKG  . EKG 12-Lead  . EKG 12-Lead  . EKG 12-Lead  . EKG 12-Lead    ASSESSMENT AND PLAN:    #1.  Community-acquired pneumonia.  Continue IV Rocephin and Zithromax.  Mucolytic therapy be provided as well as duo nebs q.i.d. and q.4 hours p.r.n.Marland Kitchen Sputum Gram stain culture and sensitivity will be obtained if patient can provide a sample Tussionex Tessalon Perles Wean off oxygen as tolerated Incentive spirometry  2.  Suspected restless leg syndrome.  Patient needs outpatient restless leg syndrome work-up  p.o. Requip started empirically.  Pain management will be provided.  3.  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  We discontinued  her Neurontin for now.   Medrol Dosepak  4.  Hypothyroidism.  We will continue Synthroid  TSH nml  5.  Dyslipidemia.  Will continue statin therapy.  6.  Depression.  Seroquel, Effexor XR and trazodone will be resumed.  7.  DVT prophylaxis.  Provided with subcutaneous Lovenox    All the records are reviewed and case discussed with Care Management/Social Workerr. Management plans discussed with the patient she is  in agreement.  CODE STATUS: fc   TOTAL TIME TAKING CARE OF THIS PATIENT: 36 minutes.   POSSIBLE D/C IN 1-2  DAYS, DEPENDING ON CLINICAL CONDITION.  Note: This dictation was prepared with Dragon dictation along with smaller phrase technology. Any transcriptional errors that result from this process are unintentional.   Nicholes Mango M.D on 08/30/2018 at 3:09 PM  Between 7am to 6pm - Pager - [phone] After 6pm go to [email or web address] - password [email or web address]  [facility]  Office  [phone]  CC: Primary care physician; [facility]

## 2018-08-31 LAB — CULTURE, BLOOD (ROUTINE X 2): Special Requests: ADEQUATE

## 2018-08-31 MED ORDER — HYDROCOD POLST-CPM POLST ER 10-8 MG/5ML PO SUER: 5.0000 mL | Freq: Two times a day (BID) | ORAL | 0 refills | Status: AC

## 2018-08-31 MED ORDER — CEFDINIR 300 MG PO CAPS: 300.0000 mg | ORAL_CAPSULE | Freq: Two times a day (BID) | ORAL | 0 refills | Status: DC

## 2018-08-31 MED ORDER — PREDNISONE 10 MG (21) PO TBPK: 10.0000 mg | ORAL_TABLET | Freq: Every day | ORAL | 0 refills | Status: DC

## 2018-08-31 MED ORDER — ACETAMINOPHEN 325 MG PO TABS: 650.0000 mg | ORAL_TABLET | Freq: Four times a day (QID) | ORAL | Status: DC | PRN

## 2018-08-31 MED ORDER — BENZONATATE 100 MG PO CAPS: 100.0000 mg | ORAL_CAPSULE | Freq: Three times a day (TID) | ORAL | 0 refills | Status: DC | PRN

## 2018-08-31 MED ORDER — GUAIFENESIN ER 600 MG PO TB12: 600.0000 mg | ORAL_TABLET | Freq: Two times a day (BID) | ORAL | 0 refills | Status: DC

## 2018-08-31 MED ORDER — AZITHROMYCIN 250 MG PO TABS: ORAL_TABLET | ORAL | 0 refills | Status: AC

## 2018-08-31 MED ORDER — ROPINIROLE HCL 0.5 MG PO TABS: 0.5000 mg | ORAL_TABLET | Freq: Every day | ORAL | 0 refills | Status: DC

## 2018-08-31 NOTE — Discharge Instructions (Signed)
Follow up with PCP in 3 days

## 2018-08-31 NOTE — Progress Notes (Signed)
MD ordered patient to be discharged home.  Discharge instructions were reviewed with the patient and she voiced understanding.  Patient instructed on making her follow-up appointment.  Prescriptions given to the patient.  IV was removed with catheter intact.  All patients questions were answered.  Patient leaving via wheelchair escorted by auxillary.

## 2018-08-31 NOTE — Discharge Summary (Addendum)
[facility] NAME: Jenna Tyler    MR#:  [id number]  DATE OF BIRTH:  1965-04-28  DATE OF ADMISSION:  08/28/2018 ADMITTING PHYSICIAN: Christel Mormon, MD  DATE OF DISCHARGE: 08/31/18   PRIMARY CARE PHYSICIAN: [facility]    ADMISSION DIAGNOSIS:  Acute respiratory failure with hypoxia ([place]) [J96.01] Community acquired pneumonia, unspecified laterality [J18.9]  DISCHARGE DIAGNOSIS:  Active Problems:   CAP (community acquired pneumonia)   SECONDARY DIAGNOSIS:   Past Medical History:  Diagnosis Date  . Asthma   . Guillain Barr syndrome ([place])   . Hyperlipidemia   . Hypertension   . Hypothyroidism   . 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 Pancreatitis     HOSPITAL COURSE:  HPI  Jenna Tyler  is a 53 y.o. Caucasian female with a known history of multiple medical problems that will be mentioned below, who presented to the emergency room with a consider of dry cough with associated dyspnea and occasional wheezing as well as low-grade fever that was up to 99.4 that started today.  She denied any chills.  No chest pain or palpitations.  No rhinorrhea or nasal congestion or sore throat or earache.  No dysuria, oliguria or hematuria or flank pain.  No headache or dizziness or blurred vision.  She has been having bilateral leg pain with restless leg lately.  She also noticed indurated patches over her arms and thighs over the last month.  No new medications except Neurontin that she started about a month ago and no new detergents or lotions or soaps.  No recent sick exposure or exposure to large crowds.  No recent travels out of the state or out of the country or to a COVID-19 epicenter.  Upon presentation to the emergency room her vital signs were within normal but later her temperature was 99.5 with a pulse of 107.  Pulse oximetry was 92% on room air and later 95 and 94% on 2 L of O2 by nasal  cannula.  Labs revealed borderline potassium of 3.5 and CO2 of 19 with lactic acid of 1, troponin I less than 0.03 and leukocytosis of 12.5 with 9.7 absolute neutrophils.  Her COVID 19 test came back negative.  Portable chest x-ray showed hazy right upper lobe opacity.  EKG showed sinus tachycardia with a rate of 113 with possible left atrial enlargement, and low voltage QRS.  The patient was given IV Rocephin and Zithromax as well as p.o. Tussionex.  She will be admitted to a medically monitored bed for further evaluation and management.   #1. Community-acquired pneumonia.  Clinically improved with IV Rocephin and Zithromax.  Discharge patient home with p.o. Augmentin  Mucolytic therapy be provided as well as duo nebs q.i.d. and q.4 hours p.r.n.Marland Kitchen Sputum Gram stain culture and sensitivity will be obtained if patient can provide a sample Tussionex Tessalon Perles Wean off oxygen as room air Incentive spirometry  2. Suspected restless leg syndrome.  Patient needs outpatient restless leg syndrome work-up  p.o. Requip started empirically. Pain management will be provided.  3. Bilateral upper and lower extremity ecchymotic patcheslikely allergic dermatitismainly involving both arms and thighs. We discontinued  her Neurontin for now.  Medrol Dosepak   4. Hypothyroidism.We will continue Synthroid  TSH nml  5. Dyslipidemia.Will continue statin therapy.  6. Depression. Seroquel, Effexor XR and trazodone will be resumed.  7. DVT prophylaxis.Provided with subcutaneous Lovenox  Discharge patient home  DISCHARGE  CONDITIONS:   fair  CONSULTS OBTAINED:     PROCEDURES  None   DRUG ALLERGIES:   Allergies  Allergen Reactions  . Nsaids Other (See Comments)    Internal bleeding  Intestinal bleeding  . Adhesive [Tape] Rash  . Aspirin Other (See Comments)    Cannot take due to ulcers.  . Penicillins Rash    Has patient had a PCN reaction causing immediate  rash, facial/tongue/throat swelling, SOB or lightheadedness with hypotension: Yes Has patient had a PCN reaction causing severe rash involving mucus membranes or skin necrosis: No Has patient had a PCN reaction that required hospitalization: No Has patient had a PCN reaction occurring within the last 10 years: Yes If all of the above answers are "NO", then may proceed with Cephalosporin use.     DISCHARGE MEDICATIONS:   Allergies as of 08/31/2018      Reactions   Nsaids Other (See Comments)   Internal bleeding  Intestinal bleeding   Adhesive [tape] Rash   Aspirin Other (See Comments)   Cannot take due to ulcers.   Penicillins Rash   Has patient had a PCN reaction causing immediate rash, facial/tongue/throat swelling, SOB or lightheadedness with hypotension: Yes Has patient had a PCN reaction causing severe rash involving mucus membranes or skin necrosis: No Has patient had a PCN reaction that required hospitalization: No Has patient had a PCN reaction occurring within the last 10 years: Yes If all of the above answers are "NO", then may proceed with Cephalosporin use.      Medication List    STOP taking these medications   gabapentin 300 MG capsule Commonly known as:  NEURONTIN     TAKE these medications   acetaminophen 325 MG tablet Commonly known as:  TYLENOL Take 2 tablets (650 mg total) by mouth every 6 (six) hours as needed for mild pain (or Fever >/= 101).   azithromycin 250 MG tablet Commonly known as:  Zithromax Z-Pak Take 2 tablets (500 mg) on  Day 1,  followed by 1 tablet (250 mg) once daily on Days 2 through 5.   benzonatate 100 MG capsule Commonly known as:  TESSALON Take 1 capsule (100 mg total) by mouth 3 (three) times daily as needed for cough.   cefdinir 300 MG capsule Commonly known as:  OMNICEF Take 1 capsule (300 mg total) by mouth 2 (two) times daily.   chlorpheniramine-HYDROcodone 10-8 MG/5ML Suer Commonly known as:  TUSSIONEX Take 5 mLs by  mouth every 12 (twelve) hours for 7 days.   hydrOXYzine 50 MG capsule Commonly known as:  VISTARIL Take 50 mg by mouth every 6 (six) hours as needed for anxiety.   levothyroxine 150 MCG tablet Commonly known as:  SYNTHROID Take 1 tablet (150 mcg total) by mouth daily.   predniSONE 10 MG (21) Tbpk tablet Commonly known as:  STERAPRED UNI-PAK 21 TAB Take 1 tablet (10 mg total) by mouth daily. Take 6 tablets by mouth for 1 day followed by  5 tablets by mouth for 1 day followed by  4 tablets by mouth for 1 day followed by  3 tablets by mouth for 1 day followed by  2 tablets by mouth for 1 day followed by  1 tablet by mouth for a day and stop   QUEtiapine 50 MG tablet Commonly known as:  SEROQUEL Take 50 mg by mouth 3 (three) times daily. What changed:  Another medication with the same name was changed. Make sure you understand how and when  to take each.   QUEtiapine 200 MG tablet Commonly known as:  SEROQUEL Take 1 tablet (200 mg total) by mouth at bedtime. What changed:  how much to take   rOPINIRole 0.5 MG tablet Commonly known as:  REQUIP Take 1 tablet (0.5 mg total) by mouth at bedtime.   tiZANidine 4 MG tablet Commonly known as:  ZANAFLEX Take 12 mg by mouth at bedtime.   Venlafaxine HCl 225 MG Tb24 Take 225 mg by mouth daily.   Ventolin HFA 108 (90 Base) MCG/ACT inhaler Generic drug:  albuterol Inhale 1-2 puffs into the lungs every 6 (six) hours as needed for wheezing or shortness of breath.        DISCHARGE INSTRUCTIONS:   Follow-up with PCP in 3 days  DIET:  Cardiac diet  DISCHARGE CONDITION:  Fair  ACTIVITY:  Activity as tolerated  OXYGEN:  Home Oxygen: No.   Oxygen Delivery: room air  DISCHARGE LOCATION:  home   If you experience worsening of your admission symptoms, develop shortness of breath, life threatening emergency, suicidal or homicidal thoughts you must seek medical attention immediately by calling 911 or calling your MD immediately   if symptoms less severe.  You Must read complete instructions/literature along with all the possible adverse reactions/side effects for all the Medicines you take and that have been prescribed to you. Take any new Medicines after you have completely understood and accpet all the possible adverse reactions/side effects.   Please note  You were cared for by a hospitalist during your hospital stay. If you have any questions about your discharge medications or the care you received while you were in the hospital after you are discharged, you can call the unit and asked to speak with the hospitalist on call if the hospitalist that took care of you is not available. Once you are discharged, your primary care physician will handle any further medical issues. Please note that NO REFILLS for any discharge medications will be authorized once you are discharged, as it is imperative that you return to your primary care physician (or establish a relationship with a primary care physician if you do not have one) for your aftercare needs so that they can reassess your need for medications and monitor your lab values.     Today  Chief Complaint  Patient presents with  . Cough   Feeling much better cough significantly improved.  Oxygen weaned off and wants to go home  ROS:  CONSTITUTIONAL: Denies fevers, chills. Denies any fatigue, weakness.  EYES: Denies blurry vision, double vision, eye pain. EARS, NOSE, THROAT: Denies tinnitus, ear pain, hearing loss. RESPIRATORY: Denies cough, wheeze, shortness of breath.  CARDIOVASCULAR: Denies chest pain, palpitations, edema.  GASTROINTESTINAL: Denies nausea, vomiting, diarrhea, abdominal pain. Denies bright red blood per rectum. GENITOURINARY: Denies dysuria, hematuria. ENDOCRINE: Denies nocturia or thyroid problems. HEMATOLOGIC AND LYMPHATIC: Denies easy bruising or bleeding. SKIN: Denies rash or lesion. MUSCULOSKELETAL: Denies pain in neck, back, shoulder,  knees, hips or arthritic symptoms.  NEUROLOGIC: Denies paralysis, paresthesias.  PSYCHIATRIC: Denies anxiety or depressive symptoms.   VITAL SIGNS:  Blood pressure 106/84, pulse 78, temperature 97.6 F (36.4 C), temperature source Oral, resp. rate 16, height 5' (1.524 m), weight 81.4 kg, SpO2 96 %.  I/O:    Intake/Output Summary (Last 24 hours) at 08/31/2018 1301 Last data filed at 08/31/2018 1003 Gross per 24 hour  Intake 2880.16 ml  Output -  Net 2880.16 ml    PHYSICAL EXAMINATION:  GENERAL:  52  y.o.-year-old patient lying in the bed with no acute distress.  EYES: Pupils equal, round, reactive to light and accommodation. No scleral icterus. Extraocular muscles intact.  HEENT: Head atraumatic, normocephalic. Oropharynx and nasopharynx clear.  NECK:  Supple, no jugular venous distention. No thyroid enlargement, no tenderness.  LUNGS: Normal breath sounds bilaterally, no wheezing, rales,rhonchi or crepitation. No use of accessory muscles of respiration.  CARDIOVASCULAR: S1, S2 normal. No murmurs, rubs, or gallops.  ABDOMEN: Soft, non-tender, non-distended. Bowel sounds present. EXTREMITIES: No pedal edema, cyanosis, or clubbing.  NEUROLOGIC: Cranial nerves II through XII are intact. Muscle strength 5/5 in all extremities. Sensation intact. Gait not checked.  PSYCHIATRIC: The patient is alert and oriented x 3.  SKIN: No obvious rash, lesion, or ulcer.   DATA REVIEW:   CBC Recent Labs  Lab 08/29/18 0527  WBC 11.3*  HGB 12.4  HCT 37.5  PLT 223    Chemistries  Recent Labs  Lab 08/28/18 2151 08/29/18 0527  NA 138 137  K 3.5 3.8  CL 108 110  CO2 19* 18*  GLUCOSE 141* 237*  BUN 17 16  CREATININE 0.97 0.82  CALCIUM 8.4* 7.8*  AST 25  --   ALT 38  --   ALKPHOS 146*  --   BILITOT 0.4  --     Cardiac Enzymes Recent Labs  Lab 08/28/18 2151  TROPONINI <0.03    Microbiology Results  Results for orders placed or performed during the hospital encounter of 08/28/18   SARS Coronavirus 2 (CEPHEID- Performed in [facility] hospital lab), Hosp Order     Status: None   Collection Time: 08/28/18  9:51 PM  Result Value Ref Range Status   SARS Coronavirus 2 NEGATIVE NEGATIVE Final    Comment: (NOTE) If result is NEGATIVE SARS-CoV-2 target nucleic acids are NOT DETECTED. The SARS-CoV-2 RNA is generally detectable in upper and lower  respiratory specimens during the acute phase of infection. The lowest  concentration of SARS-CoV-2 viral copies this assay can detect is 250  copies / mL. A negative result does not preclude SARS-CoV-2 infection  and should not be used as the sole basis for treatment or other  patient management decisions.  A negative result may occur with  improper specimen collection / handling, submission of specimen other  than nasopharyngeal swab, presence of viral mutation(s) within the  areas targeted by this assay, and inadequate number of viral copies  (<250 copies / mL). A negative result must be combined with clinical  observations, patient history, and epidemiological information. If result is POSITIVE SARS-CoV-2 target nucleic acids are DETECTED. The SARS-CoV-2 RNA is generally detectable in upper and lower  respiratory specimens dur ing the acute phase of infection.  Positive  results are indicative of active infection with SARS-CoV-2.  Clinical  correlation with patient history and other diagnostic information is  necessary to determine patient infection status.  Positive results do  not rule out bacterial infection or co-infection with other viruses. If result is PRESUMPTIVE POSTIVE SARS-CoV-2 nucleic acids MAY BE PRESENT.   A presumptive positive result was obtained on the submitted specimen  and confirmed on repeat testing.  While 2019 novel coronavirus  (SARS-CoV-2) nucleic acids may be present in the submitted sample  additional confirmatory testing may be necessary for epidemiological  and / or clinical management  purposes  to differentiate between  SARS-CoV-2 and other Sarbecovirus currently known to infect humans.  If clinically indicated additional testing with an alternate test  methodology [phone]) is  advised. The SARS-CoV-2 RNA is generally  detectable in upper and lower respiratory sp ecimens during the acute  phase of infection. The expected result is Negative. Fact Sheet for Patients:  StrictlyIdeas.no Fact Sheet for Healthcare Providers: BankingDealers.co.za This test is not yet approved or cleared by the [place] FDA and has been authorized for detection and/or diagnosis of SARS-CoV-2 by FDA under an Emergency Use Authorization (EUA).  This EUA will remain in effect (meaning this test can be used) for the duration of the COVID-19 declaration under Section 564(b)(1) of the Act, 21 U.S.C. section 360bbb-3(b)(1), unless the authorization is terminated or revoked sooner. Performed at [facility], [address] [place], [place] [address]   Blood culture (routine x 2)     Status: None (Preliminary result)   Collection Time: 08/28/18 11:47 PM  Result Value Ref Range Status   Specimen Description BLOOD RIGHT ASSIST CONTROL  Final   Special Requests   Final    BOTTLES DRAWN AEROBIC AND ANAEROBIC Blood Culture adequate volume   Culture   Final    NO GROWTH 2 DAYS Performed at [facility], [address] [place], [place] [address]    Report Status PENDING  Incomplete  Blood culture (routine x 2)     Status: Abnormal   Collection Time: 08/28/18 11:47 PM  Result Value Ref Range Status   Specimen Description   Final    BLOOD LEFT ASSIST CONTROL Performed at [facility], [address], [place], [place] [address]    Special Requests   Final    BOTTLES DRAWN AEROBIC AND ANAEROBIC Blood Culture adequate volume Performed at [facility], [address] [place], [place] [address]    Culture   Setup Time   Final    GRAM POSITIVE COCCI AEROBIC BOTTLE ONLY CRITICAL RESULT CALLED TO, READ BACK BY AND VERIFIED WITH: JASON ROBBINS @2045  08/29/18 MJU    Culture (A)  Final    STAPHYLOCOCCUS SPECIES (COAGULASE NEGATIVE) THE SIGNIFICANCE OF ISOLATING THIS ORGANISM FROM A SINGLE SET OF BLOOD CULTURES WHEN MULTIPLE SETS ARE DRAWN IS UNCERTAIN. PLEASE NOTIFY THE MICROBIOLOGY DEPARTMENT WITHIN ONE WEEK IF SPECIATION AND SENSITIVITIES ARE REQUIRED. Performed at [facility], [place] [address], [place],  [address]    Report Status 08/31/2018 FINAL  Final  Blood Culture ID Panel (Reflexed)     Status: Abnormal   Collection Time: 08/28/18 11:47 PM  Result Value Ref Range Status   Enterococcus species NOT DETECTED NOT DETECTED Final   Listeria monocytogenes NOT DETECTED NOT DETECTED Final   Staphylococcus species DETECTED (A) NOT DETECTED Final    Comment: Methicillin (oxacillin) susceptible coagulase negative staphylococcus. Possible blood culture contaminant (unless isolated from more than one blood culture draw or clinical case suggests pathogenicity). No antibiotic treatment is indicated for blood  culture contaminants. CRITICAL RESULT CALLED TO, READ BACK BY AND VERIFIED WITH: JASON ROBBINS @2045  08/29/2018 MJU    Staphylococcus aureus (BCID) NOT DETECTED NOT DETECTED Final   Methicillin resistance NOT DETECTED NOT DETECTED Final   Streptococcus species NOT DETECTED NOT DETECTED Final   Streptococcus agalactiae NOT DETECTED NOT DETECTED Final   Streptococcus pneumoniae NOT DETECTED NOT DETECTED Final   Streptococcus pyogenes NOT DETECTED NOT DETECTED Final   Acinetobacter baumannii NOT DETECTED NOT DETECTED Final   Enterobacteriaceae species NOT DETECTED NOT DETECTED Final   Enterobacter cloacae complex NOT DETECTED NOT DETECTED Final   Escherichia coli NOT DETECTED NOT DETECTED Final   Klebsiella oxytoca  NOT DETECTED NOT DETECTED Final   Klebsiella pneumoniae NOT DETECTED NOT  DETECTED Final   Proteus species NOT DETECTED NOT DETECTED Final   Serratia marcescens NOT DETECTED NOT DETECTED Final   Haemophilus influenzae NOT DETECTED NOT DETECTED Final   Neisseria meningitidis NOT DETECTED NOT DETECTED Final   Pseudomonas aeruginosa NOT DETECTED NOT DETECTED Final   Candida albicans NOT DETECTED NOT DETECTED Final   Candida glabrata NOT DETECTED NOT DETECTED Final   Candida krusei NOT DETECTED NOT DETECTED Final   Candida parapsilosis NOT DETECTED NOT DETECTED Final   Candida tropicalis NOT DETECTED NOT DETECTED Final    Comment: Performed at [facility], [address] [place], [place] [address]    RADIOLOGY:  Dg Chest Portable 1 View  Result Date: 08/28/2018 CLINICAL DATA:  Dry cough and fever EXAM: PORTABLE CHEST 1 VIEW COMPARISON:  07/11/2017 FINDINGS: There is hazy right upper lobe airspace disease. There is no pleural effusion or pneumothorax. The heart and mediastinal contours are unremarkable. The osseous structures are unremarkable. IMPRESSION: Hazy right upper lobe airspace disease concerning for pneumonia. Electronically Signed   By: Kathreen Devoid   On: 08/28/2018 21:50    EKG:   Orders placed or performed during the hospital encounter of 08/28/18  . ED EKG  . ED EKG  . EKG 12-Lead  . EKG 12-Lead  . EKG 12-Lead  . EKG 12-Lead      Management plans discussed with the patient, family and they are in agreement.  CODE STATUS:     Code Status Orders  (From admission, onward)         Start     Ordered   08/28/18 2342  Full code  Continuous     08/28/18 2346        Code Status History    Date Active Date Inactive Code Status Order ID Comments User Context   09/16/2017 2109 09/18/2017 2002 Full Code [id number]  Gorden Harms, MD Inpatient   07/12/2017 0409 07/16/2017 2057 Full Code [id number]  Harrie Foreman, MD ED   12/28/2016 2201 01/07/2017 1822 Full Code [id number]  Clapacs, Madie Reno, MD Inpatient   12/28/2016 2201 12/28/2016  2201 Full Code [id number]  Clapacs, Madie Reno, MD Inpatient   12/27/2016 0251 12/28/2016 2128 Full Code [id number]  Clearnce Hasten, Randall An, MD ED      TOTAL TIME TAKING CARE OF THIS PATIENT: 45 minutes.   Note: This dictation was prepared with Dragon dictation along with smaller phrase technology. Any transcriptional errors that result from this process are unintentional.   @MEC @  on 08/31/2018 at 1:01 PM  Between 7am to 6pm - Pager - [phone]  After 6pm go to [email or web address] - password [email or web address] [facility]  [facility]  Office  [phone]  CC: Primary care physician; [facility]

## 2018-09-03 LAB — CULTURE, BLOOD (ROUTINE X 2)
Culture: NO GROWTH
Special Requests: ADEQUATE

## 2018-09-04 ENCOUNTER — Other Ambulatory Visit
Admission: RE | Admit: 2018-09-04 | Discharge: 2018-09-04 | Disposition: A | Discharge status: Home / Self Care | Payer: BLUE CROSS/BLUE SHIELD | Source: Ambulatory Visit | Attending: Specialist | Admitting: Specialist

## 2018-09-04 DIAGNOSIS — R238 Other skin changes: Secondary | ICD-10-CM | POA: Insufficient documentation

## 2018-09-04 LAB — APTT: aPTT: 34 seconds (ref 24–36)

## 2018-09-26 ENCOUNTER — Other Ambulatory Visit: Payer: Self-pay | Admitting: Family Medicine

## 2018-09-26 DIAGNOSIS — N63 Unspecified lump in unspecified breast: Secondary | ICD-10-CM

## 2018-09-29 DIAGNOSIS — R4189 Other symptoms and signs involving cognitive functions and awareness: Secondary | ICD-10-CM | POA: Insufficient documentation

## 2018-10-09 ENCOUNTER — Ambulatory Visit
Admission: RE | Admit: 2018-10-09 | Discharge: 2018-10-09 | Disposition: A | Discharge status: Home / Self Care | Payer: BC Managed Care – PPO | Source: Ambulatory Visit | Attending: Family Medicine | Admitting: Family Medicine

## 2018-10-09 ENCOUNTER — Other Ambulatory Visit: Payer: Self-pay

## 2018-10-09 DIAGNOSIS — N63 Unspecified lump in unspecified breast: Secondary | ICD-10-CM | POA: Insufficient documentation

## 2019-01-29 ENCOUNTER — Other Ambulatory Visit: Payer: Self-pay

## 2019-01-29 ENCOUNTER — Other Ambulatory Visit
Admission: RE | Admit: 2019-01-29 | Discharge: 2019-01-29 | Disposition: A | Discharge status: Home / Self Care | Payer: BC Managed Care – PPO | Source: Ambulatory Visit | Attending: Internal Medicine | Admitting: Internal Medicine

## 2019-01-29 DIAGNOSIS — Z20828 Contact with and (suspected) exposure to other viral communicable diseases: Secondary | ICD-10-CM | POA: Insufficient documentation

## 2019-01-29 DIAGNOSIS — Z01812 Encounter for preprocedural laboratory examination: Secondary | ICD-10-CM | POA: Insufficient documentation

## 2019-01-29 LAB — SARS CORONAVIRUS 2 (TAT 6-24 HRS): SARS Coronavirus 2: NEGATIVE

## 2019-02-02 ENCOUNTER — Telehealth: Payer: Self-pay | Admitting: *Deleted

## 2019-02-02 NOTE — Telephone Encounter (Signed)
Per Pt. Request, faxed COVID test results to [phone], prior to her scheduled Endoscopy tomorrow.

## 2019-02-02 NOTE — Telephone Encounter (Signed)
Patient called to obtain COVID 19 test results from test on 01/29/2019.  Notified her of negative test results.

## 2019-02-09 ENCOUNTER — Telehealth: Payer: Self-pay | Admitting: General Surgery

## 2019-02-09 NOTE — Telephone Encounter (Signed)
Patient has called asking if Dr Bary Castilla is taking patient's at this time. I have advised patient that Dr Bary Castilla is no longer in this office and informed her of his new location and phone number.   [facility], [facility] Robert Bellow, M.D.; F.A.C.S. [address]; [place], [place] [address] Phone: [phone]. FaxMT:[phone].

## 2019-03-17 IMAGING — MR MR CERVICAL SPINE W/O CM
5 series · 33 of 48 positions shown · non-contrast
Comparison: None.

CLINICAL DATA: Worsening lower extremity weakness. History of
Guillain-Barre syndrome. Upper extremity weakness and difficulty
urinating.

EXAM:
MRI CERVICAL SPINE WITHOUT CONTRAST
TECHNIQUE: Multiplanar, multisequence MR imaging of the cervical spine was
performed. No intravenous contrast was administered.

[Series 5: T2 · axial · 3.0mm · 0.70mm/px · z∈[-129,-28]mm · 9 of 29 slices shown (1 of 2)]
[im 1/29]
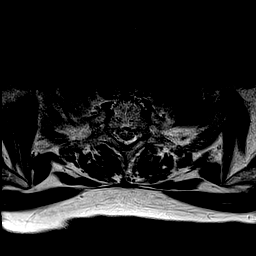
[im 5/29]
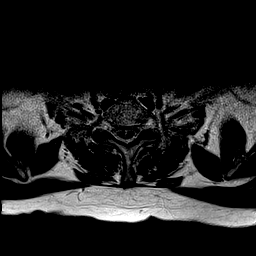
[im 9/29]
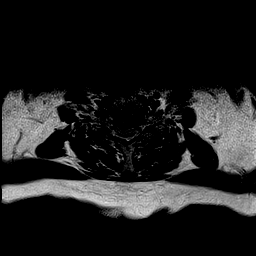
[im 13/29]
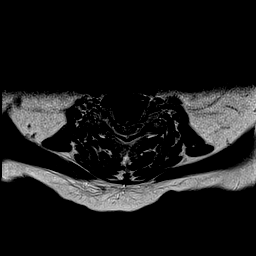
[im 15/29]
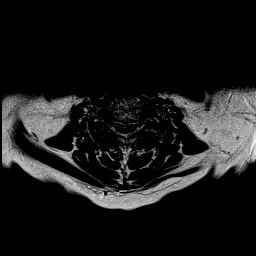
[im 17/29]
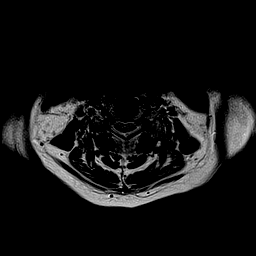
[im 21/29]
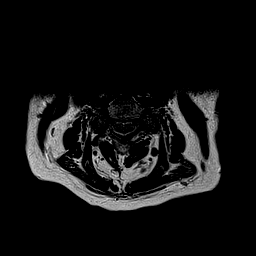
[im 25/29]
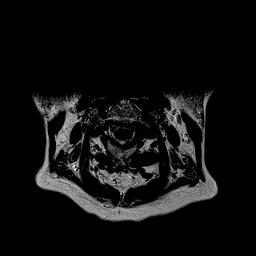
[im 29/29]
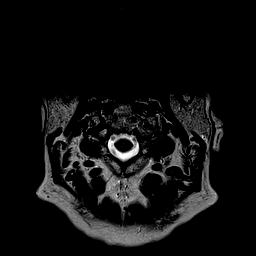

[Series 6: mpgr ax · axial · 3.0mm · 0.35mm/px · z∈[-129,-56]mm · 6 of 29 slices shown]
[im 1/29]
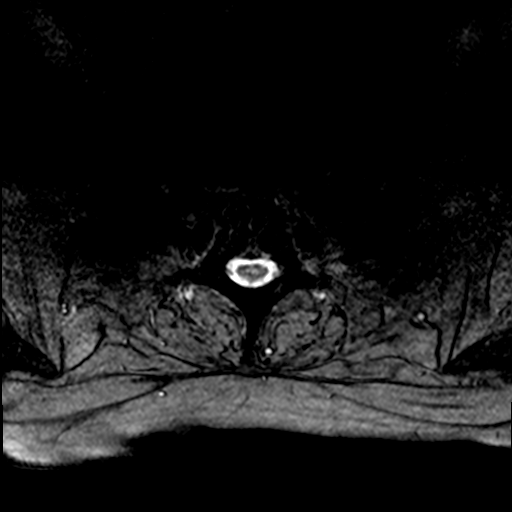
[im 5/29]
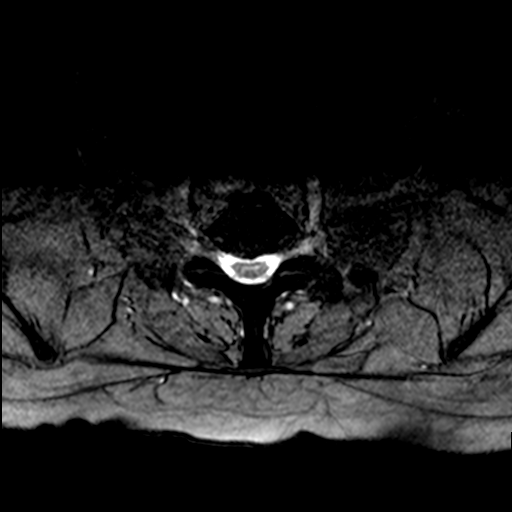
[im 9/29]
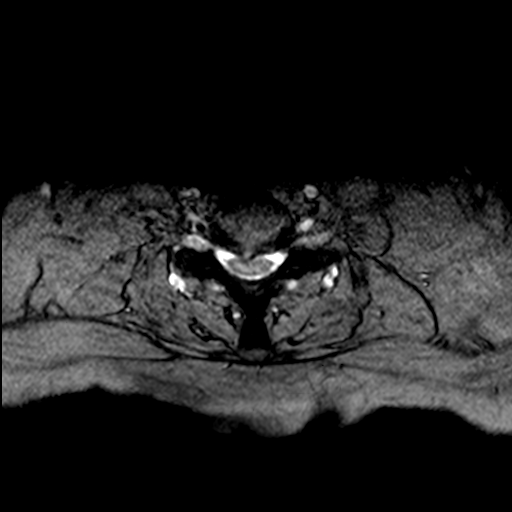
[im 13/29]
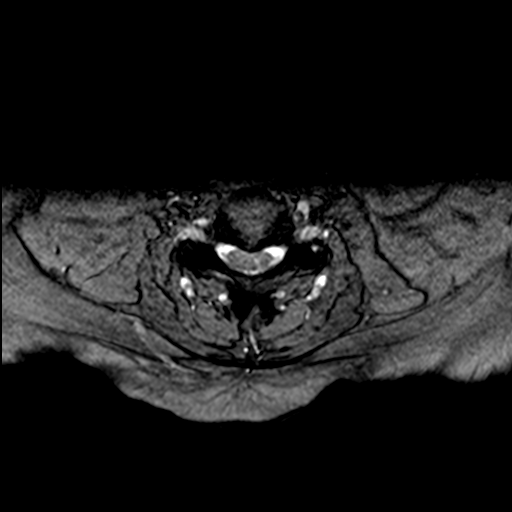
[im 17/29]
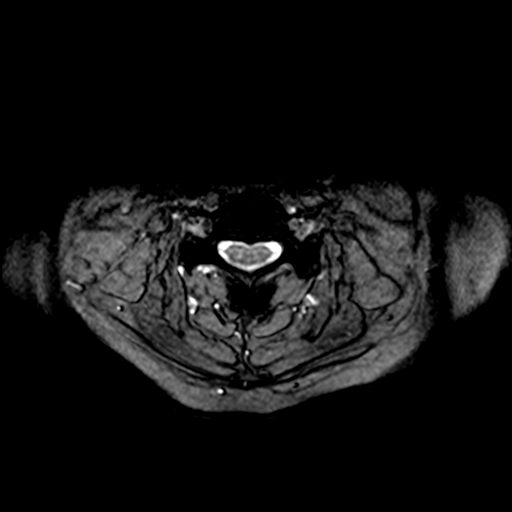
[im 21/29]
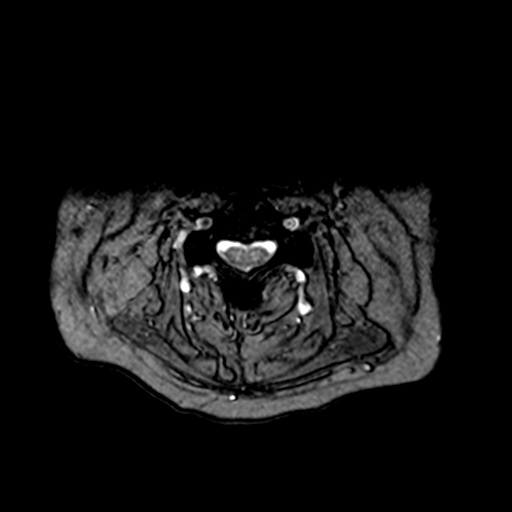

[Series 7: T2 · sagittal · 3.0mm · 0.70mm/px · 6 of 13 slices shown (2 of 2)]
[im 1/13]
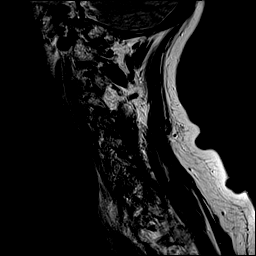
[im 3/13]
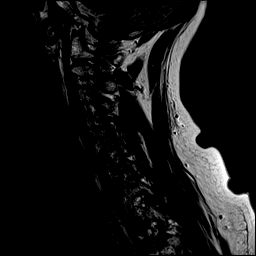
[im 5/13]
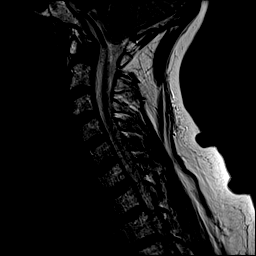
[im 8/13]
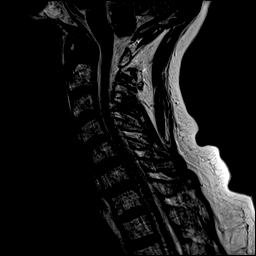
[im 10/13]
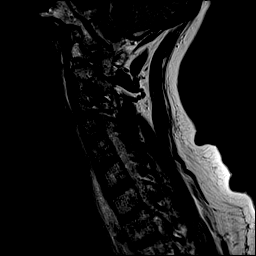
[im 13/13]
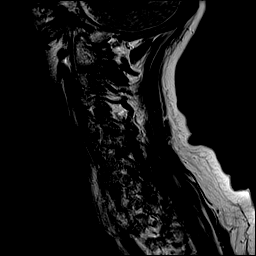

[Series 8: T1 · sagittal · 3.0mm · 0.70mm/px · 6 of 13 slices shown]
[im 1/13]
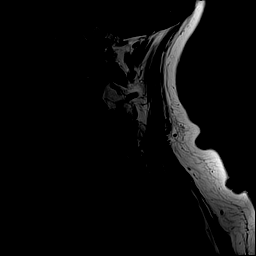
[im 3/13]
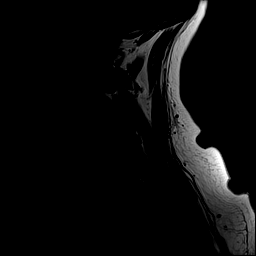
[im 5/13]
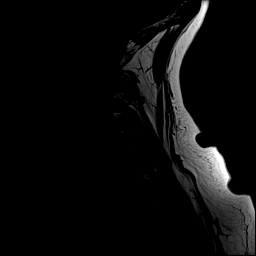
[im 8/13]
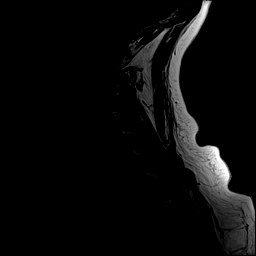
[im 10/13]
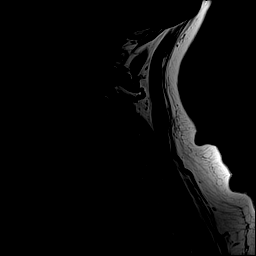
[im 13/13]
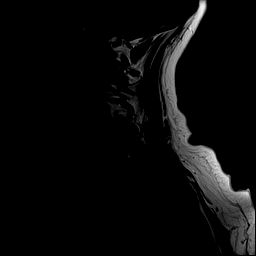

[Series 9: STIR · sagittal · 3.0mm · 0.35mm/px · 6 of 13 slices shown]
[im 1/13]
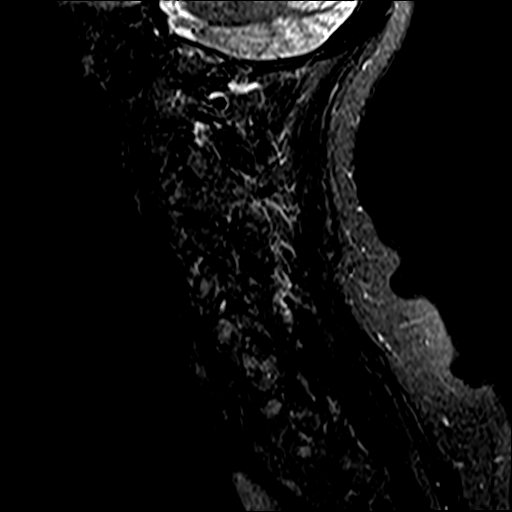
[im 3/13]
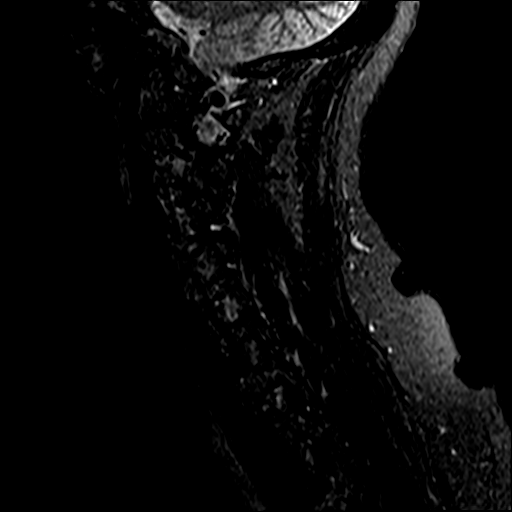
[im 5/13]
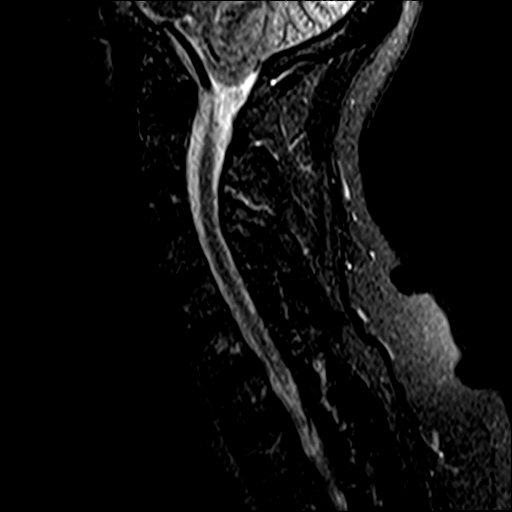
[im 8/13]
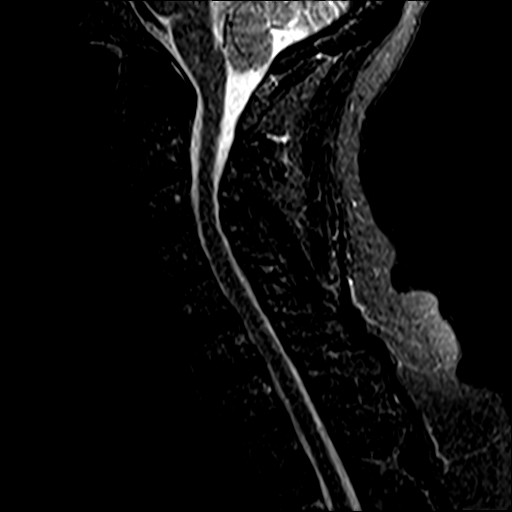
[im 10/13]
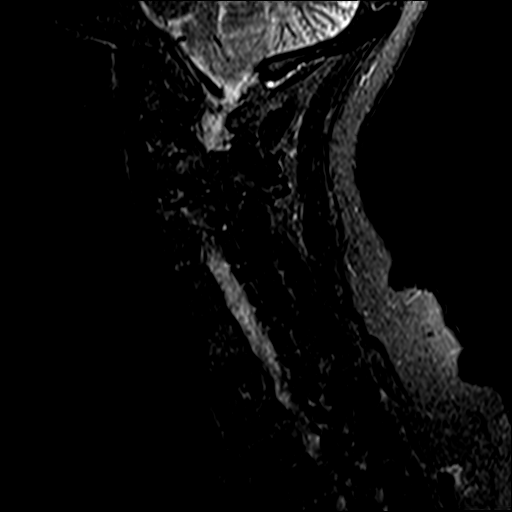
[im 13/13]
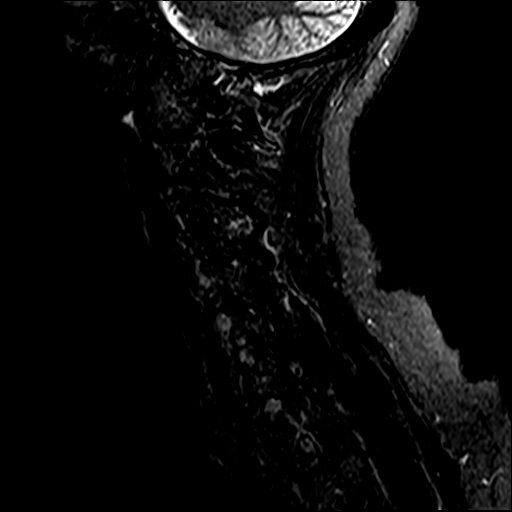

[33 of 48 positions shown; findings below may reference images not displayed]

FINDINGS: Alignment: Normal.

Vertebrae: No focal marrow lesion. No compression fracture or
evidence of discitis osteomyelitis.

Cord: Normal caliber and signal.

Posterior Fossa, vertebral arteries, paraspinal tissues: Visualized
posterior fossa is normal. Vertebral artery flow voids are
preserved. No prevertebral effusion.

Disc levels:

The C1-2 articulation is normal.

The C2-C5 levels are normal.

C5-C6: Small central disc protrusion effaces the thecal sac
ventrally and indents the anterior spinal cord. Mild spinal canal
stenosis. No neural foraminal stenosis.

C6-C7: Small central/right subarticular disc protrusion indents the
ventral spinal cord. Mild spinal canal stenosis. No neural foraminal
stenosis.

C7-T1: Normal.

T1-T2 is imaged in the sagittal plane only, with no visible disc
herniation or stenosis.
IMPRESSION: Small central disc protrusions at C5-6 and C6-7 indenting the
ventral aspect of the spinal cord and causing mild spinal canal
stenosis. No cord signal change.

## 2019-04-03 ENCOUNTER — Telehealth: Payer: Self-pay

## 2019-04-03 NOTE — Telephone Encounter (Signed)
Patient will be following up care with Dr.[facility].

## 2019-05-29 DIAGNOSIS — R251 Tremor, unspecified: Secondary | ICD-10-CM | POA: Insufficient documentation

## 2019-07-01 ENCOUNTER — Other Ambulatory Visit: Payer: Self-pay | Admitting: Family Medicine

## 2019-07-01 DIAGNOSIS — G4452 New daily persistent headache (NDPH): Secondary | ICD-10-CM

## 2019-07-02 DIAGNOSIS — R238 Other skin changes: Secondary | ICD-10-CM | POA: Insufficient documentation

## 2019-07-02 DIAGNOSIS — R233 Spontaneous ecchymoses: Secondary | ICD-10-CM | POA: Insufficient documentation

## 2019-07-02 DIAGNOSIS — D751 Secondary polycythemia: Secondary | ICD-10-CM | POA: Insufficient documentation

## 2019-07-02 NOTE — Progress Notes (Deleted)
[place]  [phone] [phone]  ID: Jenna Tyler OB: Jan 11, 1966  MR#: [id number]  [id number]  Patient Care Team: [facility] as PCP - General  CHIEF COMPLAINT: Easy bruising, polycythemia  INTERVAL HISTORY: ***  REVIEW OF SYSTEMS:   ROS  As per HPI. Otherwise, a complete review of systems is negative.  PAST MEDICAL HISTORY: Past Medical History:  Diagnosis Date  . Asthma   . Guillain Barr syndrome ([place])   . Hyperlipidemia   . Hypertension   . Hypothyroidism   . 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 Pancreatitis     PAST SURGICAL HISTORY: Past Surgical History:  Procedure Laterality Date  . ABDOMINAL HYSTERECTOMY    . BREAST BIOPSY Right 2012   negative  . CHOLECYSTECTOMY    . 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 OTHER SURGICAL HISTORY     excision of lipoma  . OTHER SURGICAL HISTORY     Abdominal surgery  . THYROID SURGERY     thyroidectomy    FAMILY HISTORY: Family History  Problem Relation Age of Onset  . Breast cancer Sister 54  . Breast cancer Maternal Aunt 49  . Breast cancer Maternal Grandmother 60  . Breast cancer Maternal Aunt 97  . Breast cancer Cousin 40       maternal side    ADVANCED DIRECTIVES (Y/N):  N  HEALTH MAINTENANCE: Social History   Tobacco Use  . Smoking status: Former Smoker    Quit date: 12/28/1997    Years since quitting: 21.5  . Smokeless tobacco: Never Used  Substance Use Topics  . Alcohol use: Not Currently    Alcohol/week: 0.0 standard drinks  . Drug use: No     Colonoscopy:  PAP:  Bone density:  Lipid panel:  Allergies  Allergen Reactions  . Nsaids Other (See Comments)    Internal bleeding  Intestinal bleeding  . Adhesive [Tape] Rash  . Aspirin Other (See Comments)    Cannot take due to ulcers.  . Penicillins Rash    Has patient had a PCN reaction causing immediate rash, facial/tongue/throat swelling, SOB  or lightheadedness with hypotension: Yes Has patient had a PCN reaction causing severe rash involving mucus membranes or skin necrosis: No Has patient had a PCN reaction that required hospitalization: No Has patient had a PCN reaction occurring within the last 10 years: Yes If all of the above answers are "NO", then may proceed with Cephalosporin use.     Current Outpatient Medications  Medication Sig Dispense Refill  . acetaminophen (TYLENOL) 325 MG tablet Take 2 tablets (650 mg total) by mouth every 6 (six) hours as needed for mild pain (or Fever >/= 101).    Marland Kitchen albuterol (VENTOLIN HFA) 108 (90 Base) MCG/ACT inhaler Inhale 1-2 puffs into the lungs every 6 (six) hours as needed for wheezing or shortness of breath.     . benzonatate (TESSALON) 100 MG capsule Take 1 capsule (100 mg total) by mouth 3 (three) times daily as needed for cough. 20 capsule 0  . cefdinir (OMNICEF) 300 MG capsule Take 1 capsule (300 mg total) by mouth 2 (two) times daily. 10 capsule 0  . hydrOXYzine (VISTARIL) 50 MG capsule Take 50 mg by mouth every 6 (six) hours as needed for anxiety.     Marland Kitchen levothyroxine (SYNTHROID, LEVOTHROID) 150 MCG tablet Take 1 tablet (150 mcg total) by mouth daily.    . predniSONE (STERAPRED UNI-PAK 21 TAB) 10 MG (21)  TBPK tablet Take 1 tablet (10 mg total) by mouth daily. Take 6 tablets by mouth for 1 day followed by  5 tablets by mouth for 1 day followed by  4 tablets by mouth for 1 day followed by  3 tablets by mouth for 1 day followed by  2 tablets by mouth for 1 day followed by  1 tablet by mouth for a day and stop 21 tablet 0  . QUEtiapine (SEROQUEL) 200 MG tablet Take 1 tablet (200 mg total) by mouth at bedtime. (Patient taking differently: Take 450 mg by mouth at bedtime. )    . QUEtiapine (SEROQUEL) 50 MG tablet Take 50 mg by mouth 3 (three) times daily.    Marland Kitchen rOPINIRole (REQUIP) 0.5 MG tablet Take 1 tablet (0.5 mg total) by mouth at bedtime. 30 tablet 0  . tiZANidine (ZANAFLEX) 4 MG  tablet Take 12 mg by mouth at bedtime.    . Venlafaxine HCl 225 MG TB24 Take 225 mg by mouth daily.     No current facility-administered medications for this visit.    OBJECTIVE: There were no vitals filed for this visit.   There is no height or weight on file to calculate BMI.    ECOG FS:{CHL ONC [id number]  General: Well-developed, well-nourished, no acute distress. Eyes: Pink conjunctiva, anicteric sclera. HEENT: Normocephalic, moist mucous membranes. Lungs: No audible wheezing or coughing. Heart: Regular rate and rhythm. Abdomen: Soft, nontender, no obvious distention. Musculoskeletal: No edema, cyanosis, or clubbing. Neuro: Alert, answering all questions appropriately. Cranial nerves grossly intact. Skin: No rashes or petechiae noted. Psych: Normal affect. Lymphatics: No cervical, calvicular, axillary or inguinal LAD.   LAB RESULTS:  Lab Results  Component Value Date   NA 137 08/29/2018   K 3.8 08/29/2018   CL 110 08/29/2018   CO2 18 (L) 08/29/2018   GLUCOSE 237 (H) 08/29/2018   BUN 16 08/29/2018   CREATININE 0.82 08/29/2018   CALCIUM 7.8 (L) 08/29/2018   PROT 6.8 08/28/2018   ALBUMIN 3.5 08/28/2018   AST 25 08/28/2018   ALT 38 08/28/2018   ALKPHOS 146 (H) 08/28/2018   BILITOT 0.4 08/28/2018   GFRNONAA >60 08/29/2018   GFRAA >60 08/29/2018    Lab Results  Component Value Date   WBC 11.3 (H) 08/29/2018   NEUTROABS 9.7 (H) 08/28/2018   HGB 12.4 08/29/2018   HCT 37.5 08/29/2018   MCV 82.2 08/29/2018   PLT 223 08/29/2018     STUDIES: No results found.  ASSESSMENT: Easy bruising, polycythemia  PLAN:    1. Easy bruising, polycythemia:  Patient expressed understanding and was in agreement with this plan. She also understands that She can call clinic at any time with any questions, concerns, or complaints.   Cancer Staging No matching staging information was found for the patient.  Lloyd Huger, MD   07/02/2019 5:18 PM

## 2019-07-03 ENCOUNTER — Ambulatory Visit: Admission: RE | Admit: 2019-07-03 | Payer: BC Managed Care – PPO | Source: Ambulatory Visit

## 2019-07-07 ENCOUNTER — Inpatient Hospital Stay: Payer: BC Managed Care – PPO | Admitting: Oncology

## 2019-07-09 ENCOUNTER — Inpatient Hospital Stay
Admission: EM | Admit: 2019-07-09 | Discharge: 2019-07-11 | DRG: 871 | Disposition: A | Discharge status: Home Health | Payer: BC Managed Care – PPO | Attending: Internal Medicine | Admitting: Internal Medicine

## 2019-07-09 ENCOUNTER — Encounter: Payer: Self-pay | Admitting: *Deleted

## 2019-07-09 ENCOUNTER — Emergency Department: Payer: BC Managed Care – PPO

## 2019-07-09 ENCOUNTER — Other Ambulatory Visit: Payer: Self-pay

## 2019-07-09 DIAGNOSIS — I10 Essential (primary) hypertension: Secondary | ICD-10-CM | POA: Diagnosis present

## 2019-07-09 DIAGNOSIS — Z880 Allergy status to penicillin: Secondary | ICD-10-CM

## 2019-07-09 DIAGNOSIS — A419 Sepsis, unspecified organism: Secondary | ICD-10-CM | POA: Diagnosis not present

## 2019-07-09 DIAGNOSIS — Z886 Allergy status to analgesic agent status: Secondary | ICD-10-CM

## 2019-07-09 DIAGNOSIS — Z87891 Personal history of nicotine dependence: Secondary | ICD-10-CM

## 2019-07-09 DIAGNOSIS — J440 Chronic obstructive pulmonary disease with acute lower respiratory infection: Secondary | ICD-10-CM | POA: Diagnosis present

## 2019-07-09 DIAGNOSIS — Q8500 Neurofibromatosis, unspecified: Secondary | ICD-10-CM

## 2019-07-09 DIAGNOSIS — R6520 Severe sepsis without septic shock: Secondary | ICD-10-CM

## 2019-07-09 DIAGNOSIS — R531 Weakness: Secondary | ICD-10-CM | POA: Diagnosis present

## 2019-07-09 DIAGNOSIS — Z91048 Other nonmedicinal substance allergy status: Secondary | ICD-10-CM

## 2019-07-09 DIAGNOSIS — J9601 Acute respiratory failure with hypoxia: Secondary | ICD-10-CM | POA: Diagnosis present

## 2019-07-09 DIAGNOSIS — G650 Sequelae of Guillain-Barre syndrome: Secondary | ICD-10-CM | POA: Diagnosis present

## 2019-07-09 DIAGNOSIS — E039 Hypothyroidism, unspecified: Secondary | ICD-10-CM

## 2019-07-09 DIAGNOSIS — R0602 Shortness of breath: Secondary | ICD-10-CM

## 2019-07-09 DIAGNOSIS — J189 Pneumonia, unspecified organism: Secondary | ICD-10-CM | POA: Diagnosis not present

## 2019-07-09 DIAGNOSIS — Z803 Family history of malignant neoplasm of breast: Secondary | ICD-10-CM

## 2019-07-09 DIAGNOSIS — E785 Hyperlipidemia, unspecified: Secondary | ICD-10-CM | POA: Diagnosis present

## 2019-07-09 DIAGNOSIS — Z79890 Hormone replacement therapy (postmenopausal): Secondary | ICD-10-CM

## 2019-07-09 DIAGNOSIS — F329 Major depressive disorder, single episode, unspecified: Secondary | ICD-10-CM | POA: Diagnosis present

## 2019-07-09 DIAGNOSIS — Z79899 Other long term (current) drug therapy: Secondary | ICD-10-CM

## 2019-07-09 DIAGNOSIS — Z20822 Contact with and (suspected) exposure to covid-19: Secondary | ICD-10-CM | POA: Diagnosis present

## 2019-07-09 LAB — CBC
HCT: 41.2 % (ref 36.0–46.0)
Hemoglobin: 13.7 g/dL (ref 12.0–15.0)
MCH: 27 pg (ref 26.0–34.0)
MCHC: 33.3 g/dL (ref 30.0–36.0)
MCV: 81.1 fL (ref 80.0–100.0)
Platelets: 285 10*3/uL (ref 150–400)
RBC: 5.08 MIL/uL (ref 3.87–5.11)
RDW: 14.5 % (ref 11.5–15.5)
WBC: 16.8 10*3/uL — ABNORMAL HIGH (ref 4.0–10.5)
nRBC: 0 % (ref 0.0–0.2)

## 2019-07-09 LAB — BASIC METABOLIC PANEL WITH GFR
Anion gap: 11 (ref 5–15)
BUN: 14 mg/dL (ref 6–20)
CO2: 20 mmol/L — ABNORMAL LOW (ref 22–32)
Calcium: 9 mg/dL (ref 8.9–10.3)
Chloride: 105 mmol/L (ref 98–111)
Creatinine, Ser: 0.85 mg/dL (ref 0.44–1.00)
GFR calc Af Amer: >60 mL/min
GFR calc non Af Amer: >60 mL/min
Glucose, Bld: 137 mg/dL — ABNORMAL HIGH (ref 70–99)
Potassium: 3.9 mmol/L (ref 3.5–5.1)
Sodium: 136 mmol/L (ref 135–145)

## 2019-07-09 LAB — TROPONIN I (HIGH SENSITIVITY): Troponin I (High Sensitivity): 5 ng/L

## 2019-07-09 LAB — RESPIRATORY PANEL BY RT PCR (FLU A&B, COVID)
Influenza A by PCR: NEGATIVE
Influenza B by PCR: NEGATIVE
SARS Coronavirus 2 by RT PCR: NEGATIVE

## 2019-07-09 LAB — LACTIC ACID, PLASMA: Lactic Acid, Venous: 2.5 mmol/L (ref 0.5–1.9)

## 2019-07-09 LAB — PROCALCITONIN: Procalcitonin: <0.1 ng/mL

## 2019-07-09 LAB — BRAIN NATRIURETIC PEPTIDE: B Natriuretic Peptide: 29 pg/mL (ref 0.0–100.0)

## 2019-07-09 MED ORDER — SODIUM CHLORIDE 0.9 % IV BOLUS (SEPSIS)
1000.0000 mL | Freq: Once | INTRAVENOUS | Status: AC
  Administered 2019-07-09: 1000 mL via INTRAVENOUS

## 2019-07-09 MED ORDER — SODIUM CHLORIDE 0.9 % IV BOLUS (SEPSIS)
500.0000 mL | Freq: Once | INTRAVENOUS | Status: AC
  Administered 2019-07-09: 500 mL via INTRAVENOUS

## 2019-07-09 MED ORDER — IPRATROPIUM-ALBUTEROL 0.5-2.5 (3) MG/3ML IN SOLN
3.0000 mL | Freq: Once | RESPIRATORY_TRACT | Status: AC
  Administered 2019-07-09: 3 mL via RESPIRATORY_TRACT
  Filled 2019-07-09: qty 3

## 2019-07-09 MED ORDER — METHYLPREDNISOLONE SODIUM SUCC 125 MG IJ SOLR
125.0000 mg | Freq: Once | INTRAMUSCULAR | Status: AC
  Administered 2019-07-09: 22:00:00 125 mg via INTRAVENOUS
  Filled 2019-07-09: qty 2

## 2019-07-09 MED ORDER — LORAZEPAM 2 MG/ML IJ SOLN
1.0000 mg | Freq: Once | INTRAMUSCULAR | Status: AC
  Administered 2019-07-09: 1 mg via INTRAVENOUS
  Filled 2019-07-09: qty 1

## 2019-07-09 MED ORDER — SODIUM CHLORIDE 0.9% FLUSH: 3.0000 mL | Freq: Once | INTRAVENOUS | Status: DC

## 2019-07-09 MED ORDER — SODIUM CHLORIDE 0.9 % IV SOLN
500.0000 mg | INTRAVENOUS | Status: DC
  Administered 2019-07-10 – 2019-07-11 (×2): 500 mg via INTRAVENOUS
  Filled 2019-07-09 (×3): qty 500

## 2019-07-09 MED ORDER — SODIUM CHLORIDE 0.9 % IV SOLN
2.0000 g | INTRAVENOUS | Status: DC
  Administered 2019-07-09 – 2019-07-10 (×2): 2 g via INTRAVENOUS
  Filled 2019-07-09 (×3): qty 20

## 2019-07-09 NOTE — ED Provider Notes (Signed)
[facility] Emergency Department Provider Note ____________________________________________   First MD Initiated Contact with Patient 07/09/19 2136     (approximate)  I have reviewed the triage vital signs and the nursing notes.   HISTORY  Chief Complaint Cough and Fever    HPI Jenna Tyler is a 54 y.o. female with PMH as noted below including COPD who presents with shortness of breath, acute onset today, persistent course throughout the day and not relieved by albuterol at home.  It is associated with nonproductive cough as well as with a fever.  The patient reports some associated chest pain although this is better now.  She denies any vomiting or diarrhea.  She has no known COVID-19 exposures.  Past Medical History:  Diagnosis Date  . Asthma   . Guillain Barr syndrome ([place])   . Hyperlipidemia   . Hypertension   . Hypothyroidism   . 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 Pancreatitis     Patient Active Problem List   Diagnosis Date Noted  . Easy bruising 07/02/2019  . Polycythemia 07/02/2019  . CAP (community acquired pneumonia) 08/28/2018  . Weakness 09/16/2017  . AKI (acute kidney injury) ([place]) 07/12/2017  . HTN (hypertension) 12/29/2016  . COPD exacerbation ([place]) 12/29/2016  . Substance induced mood disorder ([place]) 12/29/2016  . Alcohol use disorder, severe, dependence ([place]) 12/27/2016  . Major depressive disorder, recurrent severe without psychotic features ([place]) 12/27/2016  . Prediabetes 07/07/2015  . Primary hypothyroidism 04/04/2015  . Pre-diabetes 04/04/2015  . Vitamin D deficiency 04/04/2015  . Anxiety 10/12/2014  . Anxiety, generalized 10/12/2014  . H/O: hypothyroidism 10/12/2014  . Insomnia, persistent 10/12/2014  . Cannot sleep 10/12/2014  . Depression, major, recurrent, moderate ([place]) 10/12/2014  . Drug abuse, opioid type ([place]) 10/12/2014  . 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/2013    Past Surgical History:  Procedure Laterality Date  . ABDOMINAL HYSTERECTOMY    . BREAST BIOPSY Right 2012   negative  . CHOLECYSTECTOMY    . 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 OTHER SURGICAL HISTORY     excision of lipoma  . OTHER SURGICAL HISTORY     Abdominal surgery  . THYROID SURGERY     thyroidectomy    Prior to Admission medications   Medication Sig Start Date End Date Taking? Authorizing Provider  acetaminophen (TYLENOL) 325 MG tablet Take 2 tablets (650 mg total) by mouth every 6 (six) hours as needed for mild pain (or Fever >/= 101). 08/31/18   Gouru, Illene Silver, MD  albuterol (VENTOLIN HFA) 108 (90 Base) MCG/ACT inhaler Inhale 1-2 puffs into the lungs every 6 (six) hours as needed for wheezing or shortness of breath.     [provider]  benzonatate (TESSALON) 100 MG capsule Take 1 capsule (100 mg total) by mouth 3 (three) times daily as needed for cough. 08/31/18   Nicholes Mango, MD  cefdinir (OMNICEF) 300 MG capsule Take 1 capsule (300 mg total) by mouth 2 (two) times daily. 08/31/18   Nicholes Mango, MD  hydrOXYzine (VISTARIL) 50 MG capsule Take 50 mg by mouth every 6 (six) hours as needed for anxiety.     [provider]  levothyroxine (SYNTHROID, LEVOTHROID) 150 MCG tablet Take 1 tablet (150 mcg total) by mouth daily. 07/16/17   Hillary Bow, MD  predniSONE (STERAPRED UNI-PAK 21 TAB) 10 MG (21) TBPK tablet Take 1 tablet (10 mg total) by mouth daily. Take 6 tablets by mouth  for 1 day followed by  5 tablets by mouth for 1 day followed by  4 tablets by mouth for 1 day followed by  3 tablets by mouth for 1 day followed by  2 tablets by mouth for 1 day followed by  1 tablet by mouth for a day and stop 08/31/18   Gouru, Illene Silver, MD  QUEtiapine (SEROQUEL) 200 MG tablet Take 1 tablet (200 mg total) by mouth at bedtime. Patient taking differently: Take 450 mg by mouth at bedtime.  07/16/17   Hillary Bow, MD  QUEtiapine (SEROQUEL) 50  MG tablet Take 50 mg by mouth 3 (three) times daily.    [provider]  rOPINIRole (REQUIP) 0.5 MG tablet Take 1 tablet (0.5 mg total) by mouth at bedtime. 08/31/18   Gouru, Illene Silver, MD  tiZANidine (ZANAFLEX) 4 MG tablet Take 12 mg by mouth at bedtime.    [provider]  Venlafaxine HCl 225 MG TB24 Take 225 mg by mouth daily.    [provider]    Allergies Nsaids, Adhesive [tape], Aspirin, and Penicillins  Family History  Problem Relation Age of Onset  . Breast cancer Sister 53  . Breast cancer Maternal Aunt 71  . Breast cancer Maternal Grandmother 60  . Breast cancer Maternal Aunt 54  . Breast cancer Cousin 40       maternal side    Social History Social History   Tobacco Use  . Smoking status: Former Smoker    Quit date: 12/28/1997    Years since quitting: 21.5  . Smokeless tobacco: Never Used  Substance Use Topics  . Alcohol use: Not Currently    Alcohol/week: 0.0 standard drinks  . Drug use: No    Review of Systems  Constitutional: Positive for fever. Eyes: No redness. ENT: No sore throat. Cardiovascular: Positive for chest pain. Respiratory: Positive for shortness of breath. Gastrointestinal: No vomiting or diarrhea.  Genitourinary: Negative for flank pain.  Musculoskeletal: Negative for back pain. Skin: Negative for rash. Neurological: Negative for headache.   ____________________________________________   PHYSICAL EXAM:  VITAL SIGNS: ED Triage Vitals  Enc Vitals Group     BP 07/09/19 2124 (!) 156/80     Pulse Rate 07/09/19 2124 (!) 133     Resp 07/09/19 2124 (!) 22     Temp 07/09/19 2124 (!) 102 F (38.9 C)     Temp Source 07/09/19 2124 Oral     SpO2 07/09/19 2124 96 %     Weight 07/09/19 2126 174 lb (78.9 kg)     Height 07/09/19 2126 5\' 3"  (1.6 m)     Head Circumference --      Peak Flow --      Pain Score 07/09/19 2126 3     Pain Loc --      Pain Edu? --      Excl. in [place]? --     Constitutional: Alert and  oriented.  Uncomfortable appearing, actively coughing and tachypneic but in no acute distress. Eyes: Conjunctivae are normal.  Head: Atraumatic. Nose: No congestion/rhinnorhea. Mouth/Throat: Mucous membranes are moist.   Neck: Normal range of motion.  Cardiovascular: Tachycardic, regular rhythm. Grossly normal heart sounds.  Good peripheral circulation. Respiratory: Increased respiratory effort.  Decreased breath sounds and some wheezing bilaterally. Gastrointestinal: No distention.  Genitourinary: No CVA tenderness. Musculoskeletal: No lower extremity edema.  Extremities warm and well perfused.  Neurologic:  Normal speech and language. No gross focal neurologic deficits are appreciated.  Skin:  Skin is warm  and dry. No rash noted. Psychiatric: Mood and affect are normal. Speech and behavior are normal.  ____________________________________________   LABS (all labs ordered are listed, but only abnormal results are displayed)  Labs Reviewed  BASIC METABOLIC PANEL - Abnormal; Notable for the following components:      Result Value   CO2 20 (*)    Glucose, Bld 137 (*)    All other components within normal limits  CBC - Abnormal; Notable for the following components:   WBC 16.8 (*)    All other components within normal limits  LACTIC ACID, PLASMA - Abnormal; Notable for the following components:   Lactic Acid, Venous 2.5 (*)    All other components within normal limits  RESPIRATORY PANEL BY RT PCR (FLU A&B, COVID)  CULTURE, BLOOD (ROUTINE X 2)  CULTURE, BLOOD (ROUTINE X 2)  BRAIN NATRIURETIC PEPTIDE  PROCALCITONIN  LACTIC ACID, PLASMA  URINALYSIS, COMPLETE (UACMP) WITH MICROSCOPIC  BLOOD GAS, ARTERIAL  TROPONIN I (HIGH SENSITIVITY)  TROPONIN I (HIGH SENSITIVITY)   ____________________________________________  EKG  ED ECG REPORT I, Arta Silence, the attending physician, personally viewed and interpreted this ECG.  Date: 07/09/2019 EKG Time: 2127 Rate: 131 Rhythm:  Sinus tachycardia (incorrectly read by machine as SVT) QRS Axis: normal Intervals: normal incorrectly read by machine as prolonged QT)  ST/T Wave abnormalities: Nonspecific ST abnormalities Narrative Interpretation: Nonspecific abnormalities with no evidence of acute ischemia  ____________________________________________  RADIOLOGY  CXR: Right upper lobe infiltrate concerning for pneumonia  ____________________________________________   PROCEDURES  Procedure(s) performed: Yes  .1-3 Lead EKG Interpretation Performed by: Arta Silence, MD Authorized by: Arta Silence, MD     Interpretation: non-specific     ECG rate:  130   ECG rate assessment: tachycardic     Rhythm: sinus tachycardia     Ectopy: none     Conduction: normal   Comments:     Cardiac monitoring indicated due to significant tachycardia and concern for arrhythmia or ectopy    Critical Care performed: Yes  CRITICAL CARE Performed by: Arta Silence   Total critical care time: 30 minutes  Critical care time was exclusive of separately billable procedures and treating other patients.  Critical care was necessary to treat or prevent imminent or life-threatening deterioration.  Critical care was time spent personally by me on the following activities: development of treatment plan with patient and/or surrogate as well as nursing, discussions with consultants, evaluation of patient's response to treatment, examination of patient, obtaining history from patient or surrogate, ordering and performing treatments and interventions, ordering and review of laboratory studies, ordering and review of radiographic studies, pulse oximetry and re-evaluation of patient's condition. ____________________________________________   INITIAL IMPRESSION / ASSESSMENT AND PLAN / ED COURSE  Pertinent labs & imaging results that were available during my care of the patient were reviewed by me and considered in my  medical decision making (see chart for details).  54 year old female with PMH as noted above including a history of COPD presents with acute onset of shortness of breath, cough, and fever today.  I reviewed the past medical records in [place].  The patient was most recently admitted in May of last year with community-acquired pneumonia causing respiratory failure and hypoxia.  On exam today, the patient is uncomfortable appearing, actively coughing.  She is tachycardic and febrile with tachypnea and increased work of breathing.  O2 saturation is in the mid 90s on room air, although the patient reports that at home she saw an O2 saturation  in the high 80s.  She is not on oxygen normally.  She has some wheezing bilaterally.  The remainder of the exam is unremarkable.  Overall presentation is most consistent with pneumonia including possible COVID-19.  Differential includes COPD exacerbation or acute bronchitis.  We will obtain a chest x-ray, Covid swab, lab work-up, and reassess.  I anticipate admission.  ----------------------------------------- 11:44 PM on 07/09/2019 -----------------------------------------  Chest x-ray shows right upper lobe infiltrate although clinically I suspect a significant component of COPD.  The patient continued of significantly increased work of breathing despite nebulizer treatments, and O2 saturation was in the low 90s.  I placed on BiPAP and she is now doing well.  The heart rate is improving with fluids.  I ordered antibiotics for CAP coverage.  I discussed the case with the hospitalist Dr. Marcello Moores for admission.  _________________________  Jenna Tyler was evaluated in Emergency Department on 07/09/2019 for the symptoms described in the history of present illness. She was evaluated in the context of the global COVID-19 pandemic, which necessitated consideration that the patient might be at risk for infection with the SARS-CoV-2 virus that causes COVID-19.  Institutional protocols and algorithms that pertain to the evaluation of patients at risk for COVID-19 are in a state of rapid change based on information released by regulatory bodies including the CDC and federal and state organizations. These policies and algorithms were followed during the patient's care in the ED. ____________________________________________   FINAL CLINICAL IMPRESSION(S) / ED DIAGNOSES  Final diagnoses:  Acute respiratory failure with hypoxia ([place])  Community acquired pneumonia of right lung, unspecified part of lung      NEW MEDICATIONS STARTED DURING THIS VISIT:  New Prescriptions   No medications on file     Note:  This document was prepared using Dragon voice recognition software and may include unintentional dictation errors.    Arta Silence, MD 07/09/19 [phone]

## 2019-07-09 NOTE — H&P (Addendum)
History and Physical    Jenna Tyler [id number] DOB: [birth year] DOA: 07/09/2019  PCP: [facility]  Patient coming from: Home  I have personally briefly reviewed patient's old medical records in [facility]  Chief Complaint: acute sob HPI: Jenna Tyler is a 54 y.o. female with medical history significant of asthma/COPD, Hypertension, hypothyroidism in addition to medical problems noted below presents to ed with  Acute shortness of breath,  not relieved by home albuterol. Patient states she was in her normal state of health yesterday and today acute go ill. She notes no sick contact, does states she had mild cough that was nonproductive one day ago. However today notes she had fever and persistent sob.    ED Course: Temp 102, hr 133, bp 156/80  Sat 96% on [place] , rr 30's Patient was placed on bipap to assist with work of breathing   cxr : note infiltrate  Patient treated with solumedrol as well as  CAP antibiotics in ed  Abg completed : noted stable ph no CO2 retention with stable sat  See results below.   Review of Systems: As per HPI otherwise 10 point review of systems negative.   Past Medical History:  Diagnosis Date  . Asthma   . Guillain Barr syndrome ([place])   . Hyperlipidemia   . Hypertension   . Hypothyroidism   . 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 Pancreatitis     Past Surgical History:  Procedure Laterality Date  . ABDOMINAL HYSTERECTOMY    . BREAST BIOPSY Right 2012   negative  . CHOLECYSTECTOMY    . 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 OTHER SURGICAL HISTORY     excision of lipoma  . OTHER SURGICAL HISTORY     Abdominal surgery  . THYROID SURGERY     thyroidectomy     reports that she quit smoking about 21 years ago. She has never used smokeless tobacco. She reports previous alcohol use. She reports that she does not use drugs.  Allergies  Allergen Reactions  . Nsaids Other (See Comments)     Internal bleeding  Intestinal bleeding  . Adhesive [Tape] Rash  . Aspirin Other (See Comments)    Cannot take due to ulcers.  . Penicillins Rash    Has patient had a PCN reaction causing immediate rash, facial/tongue/throat swelling, SOB or lightheadedness with hypotension: Yes Has patient had a PCN reaction causing severe rash involving mucus membranes or skin necrosis: No Has patient had a PCN reaction that required hospitalization: No Has patient had a PCN reaction occurring within the last 10 years: Yes If all of the above answers are "NO", then may proceed with Cephalosporin use.     Family History  Problem Relation Age of Onset  . Breast cancer Sister 18  . Breast cancer Maternal Aunt 60  . Breast cancer Maternal Grandmother 60  . Breast cancer Maternal Aunt 38  . Breast cancer Cousin 40       maternal side   Prior to Admission medications   Medication Sig Start Date End Date Taking? Authorizing Provider  acetaminophen (TYLENOL) 325 MG tablet Take 2 tablets (650 mg total) by mouth every 6 (six) hours as needed for mild pain (or Fever >/= 101). 08/31/18   Gouru, Illene Silver, MD  albuterol (VENTOLIN HFA) 108 (90 Base) MCG/ACT inhaler Inhale 1-2 puffs into the lungs every 6 (six) hours as needed for wheezing or shortness of breath.  [provider]  benzonatate (TESSALON) 100 MG capsule Take 1 capsule (100 mg total) by mouth 3 (three) times daily as needed for cough. 08/31/18   Nicholes Mango, MD  cefdinir (OMNICEF) 300 MG capsule Take 1 capsule (300 mg total) by mouth 2 (two) times daily. 08/31/18   Nicholes Mango, MD  hydrOXYzine (VISTARIL) 50 MG capsule Take 50 mg by mouth every 6 (six) hours as needed for anxiety.     [provider]  levothyroxine (SYNTHROID, LEVOTHROID) 150 MCG tablet Take 1 tablet (150 mcg total) by mouth daily. 07/16/17   Hillary Bow, MD  predniSONE (STERAPRED UNI-PAK 21 TAB) 10 MG (21) TBPK tablet Take 1 tablet (10 mg total) by mouth daily.  Take 6 tablets by mouth for 1 day followed by  5 tablets by mouth for 1 day followed by  4 tablets by mouth for 1 day followed by  3 tablets by mouth for 1 day followed by  2 tablets by mouth for 1 day followed by  1 tablet by mouth for a day and stop 08/31/18   Gouru, Illene Silver, MD  QUEtiapine (SEROQUEL) 200 MG tablet Take 1 tablet (200 mg total) by mouth at bedtime. Patient taking differently: Take 450 mg by mouth at bedtime.  07/16/17   Hillary Bow, MD  QUEtiapine (SEROQUEL) 50 MG tablet Take 50 mg by mouth 3 (three) times daily.    [provider]  rOPINIRole (REQUIP) 0.5 MG tablet Take 1 tablet (0.5 mg total) by mouth at bedtime. 08/31/18   Gouru, Illene Silver, MD  tiZANidine (ZANAFLEX) 4 MG tablet Take 12 mg by mouth at bedtime.    [provider]  Venlafaxine HCl 225 MG TB24 Take 225 mg by mouth daily.    [provider]    Physical Exam: Vitals:   07/09/19 2124 07/09/19 2126 07/09/19 2214 07/09/19 2300  BP: (!) 156/80  (!) 130/113   Pulse: (!) 133  (!) 151 (!) 127  Resp: (!) 22  (!) 22 13  Temp: (!) 102 F (38.9 C)     TempSrc: Oral     SpO2: 96%  96% 100%  Weight:  78.9 kg    Height:  5\' 3"  (1.6 m)      Constitutional: NAD, calm, comfortable Vitals:   07/09/19 2124 07/09/19 2126 07/09/19 2214 07/09/19 2300  BP: (!) 156/80  (!) 130/113   Pulse: (!) 133  (!) 151 (!) 127  Resp: (!) 22  (!) 22 13  Temp: (!) 102 F (38.9 C)     TempSrc: Oral     SpO2: 96%  96% 100%  Weight:  78.9 kg    Height:  5\' 3"  (1.6 m)     Eyes: PERRL, lids and conjunctivae normal ENMT: On bipap   Neck: normal, supple, no masses, no thyromegaly Respiratory: coarse bs bilaterally, no wheezing, no crackles. Mild increase respiratory effort.  Cardiovascular: Regular rate and rhythm, no murmurs / rubs / gallops. No extremity edema. 2+ pedal pulses. No carotid bruits.  Abdomen: no tenderness, no masses palpated. No hepatosplenomegaly. Bowel sounds positive.  Musculoskeletal: no  clubbing / cyanosis. No joint deformity upper and lower extremities. Good ROM, no contractures. Normal muscle tone.  Skin: no rashes, lesions, ulcers. No induration Neurologic: CN 2-12 grossly intact. Sensation intact, DTR normal. Strength 5/5 in all 4.  Psychiatric: Normal judgment and insight. Alert and oriented x 3. Normal mood.    Labs on Admission: I have personally reviewed following labs and imaging studies  CBC: Recent Labs  Lab 07/09/19 2201  WBC 16.8*  HGB 13.7  HCT 41.2  MCV 81.1  PLT [id number]   Basic Metabolic Panel: Recent Labs  Lab 07/09/19 2201  NA 136  K 3.9  CL 105  CO2 20*  GLUCOSE 137*  BUN 14  CREATININE 0.85  CALCIUM 9.0   GFR: Estimated Creatinine Clearance: 76.1 mL/min (by C-G formula based on SCr of 0.85 mg/dL). Liver Function Tests: No results for input(s): AST, ALT, ALKPHOS, BILITOT, PROT, ALBUMIN in the last 168 hours. No results for input(s): LIPASE, AMYLASE in the last 168 hours. No results for input(s): AMMONIA in the last 168 hours. Coagulation Profile: No results for input(s): INR, PROTIME in the last 168 hours. Cardiac Enzymes: No results for input(s): CKTOTAL, CKMB, CKMBINDEX, TROPONINI in the last 168 hours. BNP (last 3 results) No results for input(s): PROBNP in the last 8760 hours. HbA1C: No results for input(s): HGBA1C in the last 72 hours. CBG: No results for input(s): GLUCAP in the last 168 hours. Lipid Profile: No results for input(s): CHOL, HDL, LDLCALC, TRIG, CHOLHDL, LDLDIRECT in the last 72 hours. Thyroid Function Tests: No results for input(s): TSH, T4TOTAL, FREET4, T3FREE, THYROIDAB in the last 72 hours. Anemia Panel: No results for input(s): VITAMINB12, FOLATE, FERRITIN, TIBC, IRON, RETICCTPCT in the last 72 hours. Urine analysis:    Component Value Date/Time   COLORURINE AMBER (A) 09/21/2017 0515   APPEARANCEUR TURBID (A) 09/21/2017 0515   APPEARANCEUR Clear 03/28/2014 1540   LABSPEC 1.020 09/21/2017 0515    LABSPEC 1.021 03/28/2014 1540   PHURINE 5.0 09/21/2017 0515   GLUCOSEU NEGATIVE 09/21/2017 0515   GLUCOSEU Negative 03/28/2014 1540   HGBUR NEGATIVE 09/21/2017 0515   BILIRUBINUR NEGATIVE 09/21/2017 0515   BILIRUBINUR Negative 03/28/2014 1540   KETONESUR NEGATIVE 09/21/2017 0515   PROTEINUR 30 (A) 09/21/2017 0515   NITRITE NEGATIVE 09/21/2017 0515   LEUKOCYTESUR SMALL (A) 09/21/2017 0515   LEUKOCYTESUR 1+ 03/28/2014 1540    Radiological Exams on Admission: DG Chest 1 View  Result Date: 07/09/2019 CLINICAL DATA:  54 year old female with shortness of breath. EXAM: CHEST  1 VIEW COMPARISON:  Chest radiograph dated 08/28/2018. FINDINGS: There is diffuse interstitial coarsening. Patchy area of airspace density in the right upper lobe has progressed since the prior radiograph and concerning for infiltrate. Clinical correlation and follow-up to resolution recommended. There is no pleural effusion or pneumothorax. Stable cardiac silhouette. No acute osseous pathology. IMPRESSION: Interval progression of the right upper lobe airspace opacity concerning for pneumonia. Follow-up to resolution recommended. Electronically Signed   By: Anner Crete M.D.   On: 07/09/2019 22:04    EKG: Independently reviewed.  Sinus tachycardia, no hyper acute st-twave changes  Assessment/Plan  Sepsis without shock -place sepsis protocol  - lactic acid improving  -continue ivfs/abx  -due to CAP    Community-acquired pneumonia.  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 - continue Synthroid   Dyslipidemia -continue statin therapy.  Depression. -Seroquel, Effexor XR and trazodone will be resumed.  DVT  prophylaxis: lwmh Code Status: FULL  Family Communication: n/a Disposition Plan:  2-3 days  Consults called: n/a Admission status: inpatient   Clance Boll MD [facility]  If 7PM-7AM,  please contact night-coverage [email or web address] Password [facility]  07/09/2019, 11:53 PM

## 2019-07-09 NOTE — ED Triage Notes (Signed)
Pt ambulatory to triage.  Pt has sob, cough and fever.  Hx copd.  Sx began today.  Pt also reports chest pain.   Denies covid exposure.  Pt alert  Speech clear.

## 2019-07-10 ENCOUNTER — Inpatient Hospital Stay: Payer: BC Managed Care – PPO

## 2019-07-10 DIAGNOSIS — E039 Hypothyroidism, unspecified: Secondary | ICD-10-CM | POA: Diagnosis present

## 2019-07-10 DIAGNOSIS — Z20822 Contact with and (suspected) exposure to covid-19: Secondary | ICD-10-CM | POA: Diagnosis present

## 2019-07-10 DIAGNOSIS — A419 Sepsis, unspecified organism: Secondary | ICD-10-CM

## 2019-07-10 DIAGNOSIS — Z79899 Other long term (current) drug therapy: Secondary | ICD-10-CM | POA: Diagnosis not present

## 2019-07-10 DIAGNOSIS — Z91048 Other nonmedicinal substance allergy status: Secondary | ICD-10-CM | POA: Diagnosis not present

## 2019-07-10 DIAGNOSIS — J440 Chronic obstructive pulmonary disease with acute lower respiratory infection: Secondary | ICD-10-CM | POA: Diagnosis present

## 2019-07-10 DIAGNOSIS — E785 Hyperlipidemia, unspecified: Secondary | ICD-10-CM | POA: Diagnosis present

## 2019-07-10 DIAGNOSIS — G650 Sequelae of Guillain-Barre syndrome: Secondary | ICD-10-CM | POA: Diagnosis present

## 2019-07-10 DIAGNOSIS — Z886 Allergy status to analgesic agent status: Secondary | ICD-10-CM | POA: Diagnosis not present

## 2019-07-10 DIAGNOSIS — R531 Weakness: Secondary | ICD-10-CM | POA: Diagnosis present

## 2019-07-10 DIAGNOSIS — Z79890 Hormone replacement therapy (postmenopausal): Secondary | ICD-10-CM | POA: Diagnosis not present

## 2019-07-10 DIAGNOSIS — Z880 Allergy status to penicillin: Secondary | ICD-10-CM | POA: Diagnosis not present

## 2019-07-10 DIAGNOSIS — Z803 Family history of malignant neoplasm of breast: Secondary | ICD-10-CM | POA: Diagnosis not present

## 2019-07-10 DIAGNOSIS — F329 Major depressive disorder, single episode, unspecified: Secondary | ICD-10-CM | POA: Diagnosis present

## 2019-07-10 DIAGNOSIS — R6520 Severe sepsis without septic shock: Secondary | ICD-10-CM

## 2019-07-10 DIAGNOSIS — Z87891 Personal history of nicotine dependence: Secondary | ICD-10-CM | POA: Diagnosis not present

## 2019-07-10 DIAGNOSIS — J9601 Acute respiratory failure with hypoxia: Secondary | ICD-10-CM | POA: Diagnosis present

## 2019-07-10 DIAGNOSIS — R0602 Shortness of breath: Secondary | ICD-10-CM | POA: Diagnosis present

## 2019-07-10 DIAGNOSIS — I10 Essential (primary) hypertension: Secondary | ICD-10-CM | POA: Diagnosis present

## 2019-07-10 DIAGNOSIS — J189 Pneumonia, unspecified organism: Secondary | ICD-10-CM | POA: Diagnosis present

## 2019-07-10 DIAGNOSIS — Q8500 Neurofibromatosis, unspecified: Secondary | ICD-10-CM | POA: Diagnosis not present

## 2019-07-10 DIAGNOSIS — J441 Chronic obstructive pulmonary disease with (acute) exacerbation: Secondary | ICD-10-CM

## 2019-07-10 HISTORY — DX: Severe sepsis without septic shock: R65.20

## 2019-07-10 LAB — BLOOD GAS, ARTERIAL
Acid-base deficit: 5.6 mmol/L — ABNORMAL HIGH (ref 0.0–2.0)
Bicarbonate: 18.2 mmol/L — ABNORMAL LOW (ref 20.0–28.0)
Delivery systems: POSITIVE
Expiratory PAP: 6
FIO2: 0.3
Inspiratory PAP: 12
O2 Saturation: 97.7 %
Patient temperature: 37
pCO2 arterial: 30 mmHg — ABNORMAL LOW (ref 32.0–48.0)
pH, Arterial: 7.39 (ref 7.350–7.450)
pO2, Arterial: 100 mmHg (ref 83.0–108.0)

## 2019-07-10 LAB — CBC WITH DIFFERENTIAL/PLATELET
Abs Immature Granulocytes: 0.11 10*3/uL — ABNORMAL HIGH (ref 0.00–0.07)
Basophils Absolute: 0 10*3/uL (ref 0.0–0.1)
Basophils Relative: 0 %
Eosinophils Absolute: 0 10*3/uL (ref 0.0–0.5)
Eosinophils Relative: 0 %
HCT: 37 % (ref 36.0–46.0)
Hemoglobin: 12.4 g/dL (ref 12.0–15.0)
Immature Granulocytes: 1 %
Lymphocytes Relative: 4 %
Lymphs Abs: 0.7 10*3/uL (ref 0.7–4.0)
MCH: 27.1 pg (ref 26.0–34.0)
MCHC: 33.5 g/dL (ref 30.0–36.0)
MCV: 81 fL (ref 80.0–100.0)
Monocytes Absolute: 0.3 10*3/uL (ref 0.1–1.0)
Monocytes Relative: 2 %
Neutro Abs: 14.8 10*3/uL — ABNORMAL HIGH (ref 1.7–7.7)
Neutrophils Relative %: 93 %
Platelets: 244 10*3/uL (ref 150–400)
RBC: 4.57 MIL/uL (ref 3.87–5.11)
RDW: 14.6 % (ref 11.5–15.5)
WBC: 15.9 10*3/uL — ABNORMAL HIGH (ref 4.0–10.5)
nRBC: 0 % (ref 0.0–0.2)

## 2019-07-10 LAB — URINALYSIS, COMPLETE (UACMP) WITH MICROSCOPIC
Bacteria, UA: NONE SEEN
Bilirubin Urine: NEGATIVE
Glucose, UA: NEGATIVE mg/dL
Hgb urine dipstick: NEGATIVE
Ketones, ur: NEGATIVE mg/dL
Nitrite: NEGATIVE
Protein, ur: NEGATIVE mg/dL
Specific Gravity, Urine: 1.01 (ref 1.005–1.030)
pH: 5.5 (ref 5.0–8.0)

## 2019-07-10 LAB — COMPREHENSIVE METABOLIC PANEL
ALT: 58 U/L — ABNORMAL HIGH (ref 0–44)
AST: 39 U/L (ref 15–41)
Albumin: 2.9 g/dL — ABNORMAL LOW (ref 3.5–5.0)
Alkaline Phosphatase: 83 U/L (ref 38–126)
Anion gap: 9 (ref 5–15)
BUN: 11 mg/dL (ref 6–20)
CO2: 16 mmol/L — ABNORMAL LOW (ref 22–32)
Calcium: 7.1 mg/dL — ABNORMAL LOW (ref 8.9–10.3)
Chloride: 115 mmol/L — ABNORMAL HIGH (ref 98–111)
Creatinine, Ser: 0.63 mg/dL (ref 0.44–1.00)
GFR calc Af Amer: >60 mL/min (ref >60)
GFR calc non Af Amer: >60 mL/min (ref >60)
Glucose, Bld: 198 mg/dL — ABNORMAL HIGH (ref 70–99)
Potassium: 3.6 mmol/L (ref 3.5–5.1)
Sodium: 140 mmol/L (ref 135–145)
Total Bilirubin: 0.5 mg/dL (ref 0.3–1.2)
Total Protein: 5.3 g/dL — ABNORMAL LOW (ref 6.5–8.1)

## 2019-07-10 LAB — GLUCOSE, CAPILLARY: Glucose-Capillary: 111 mg/dL — ABNORMAL HIGH (ref 70–99)

## 2019-07-10 LAB — LACTIC ACID, PLASMA: Lactic Acid, Venous: 2.1 mmol/L (ref 0.5–1.9)

## 2019-07-10 LAB — TROPONIN I (HIGH SENSITIVITY): Troponin I (High Sensitivity): 7 ng/L (ref <18)

## 2019-07-10 MED ORDER — FENTANYL CITRATE (PF) 100 MCG/2ML IJ SOLN
50.0000 ug | Freq: Once | INTRAMUSCULAR | Status: AC
  Administered 2019-07-10: 50 ug via INTRAVENOUS
  Filled 2019-07-10: qty 2

## 2019-07-10 MED ORDER — HYDROCOD POLST-CPM POLST ER 10-8 MG/5ML PO SUER
5.0000 mL | Freq: Two times a day (BID) | ORAL | Status: DC | PRN
  Administered 2019-07-10: 5 mL via ORAL
  Filled 2019-07-10: qty 5

## 2019-07-10 MED ORDER — MORPHINE SULFATE (PF) 2 MG/ML IV SOLN
2.0000 mg | Freq: Once | INTRAVENOUS | Status: AC
  Administered 2019-07-10: 2 mg via INTRAVENOUS
  Filled 2019-07-10: qty 1

## 2019-07-10 MED ORDER — ENOXAPARIN SODIUM 40 MG/0.4ML ~~LOC~~ SOLN
40.0000 mg | SUBCUTANEOUS | Status: DC
  Administered 2019-07-10: 40 mg via SUBCUTANEOUS
  Filled 2019-07-10: qty 0.4

## 2019-07-10 MED ORDER — METHYLPREDNISOLONE SODIUM SUCC 40 MG IJ SOLR
40.0000 mg | Freq: Two times a day (BID) | INTRAMUSCULAR | Status: DC
  Administered 2019-07-10 – 2019-07-11 (×2): 40 mg via INTRAVENOUS
  Filled 2019-07-10 (×2): qty 1

## 2019-07-10 MED ORDER — ALBUTEROL SULFATE (2.5 MG/3ML) 0.083% IN NEBU
2.5000 mg | INHALATION_SOLUTION | Freq: Four times a day (QID) | RESPIRATORY_TRACT | Status: DC
  Administered 2019-07-10 – 2019-07-11 (×5): 2.5 mg via RESPIRATORY_TRACT
  Filled 2019-07-10 (×5): qty 3

## 2019-07-10 MED ORDER — ENOXAPARIN SODIUM 40 MG/0.4ML ~~LOC~~ SOLN: 30.0000 mg | Freq: Two times a day (BID) | SUBCUTANEOUS | Status: DC

## 2019-07-10 MED ORDER — ONDANSETRON HCL 4 MG/2ML IJ SOLN: 4.0000 mg | Freq: Four times a day (QID) | INTRAMUSCULAR | Status: DC | PRN

## 2019-07-10 MED ORDER — INSULIN ASPART 100 UNIT/ML ~~LOC~~ SOLN
0.0000 [IU] | SUBCUTANEOUS | Status: DC
  Administered 2019-07-11: 09:00:00 2 [IU] via SUBCUTANEOUS
  Administered 2019-07-11: 3 [IU] via SUBCUTANEOUS
  Filled 2019-07-10 (×2): qty 1

## 2019-07-10 MED ORDER — OXYCODONE HCL 5 MG PO TABS
10.0000 mg | ORAL_TABLET | Freq: Four times a day (QID) | ORAL | Status: DC | PRN
  Administered 2019-07-10 – 2019-07-11 (×5): 10 mg via ORAL
  Filled 2019-07-10 (×5): qty 2

## 2019-07-10 MED ORDER — GUAIFENESIN-CODEINE 100-10 MG/5ML PO SOLN
10.0000 mL | ORAL | Status: DC | PRN
  Administered 2019-07-10 – 2019-07-11 (×2): 10 mL via ORAL
  Filled 2019-07-10 (×2): qty 10

## 2019-07-10 MED ORDER — SODIUM CHLORIDE 0.9 % IV SOLN: INTRAVENOUS | Status: AC

## 2019-07-10 NOTE — ED Notes (Signed)
Pt provided with clean external cath, brief and chux. Pt repositioned in the bed and given a pillow.

## 2019-07-10 NOTE — ED Notes (Signed)
CCU charge RN states she will call supervisor to request downgrade. ED charge RN notified.

## 2019-07-10 NOTE — ED Notes (Signed)
Pt assisted back to bed and repositioned at this time. Pt placed back on O2 5L [place].

## 2019-07-10 NOTE — ED Notes (Signed)
Patient is calm at this time, no longer coughing. Will continue to monitor.

## 2019-07-10 NOTE — ED Notes (Signed)
Pt still maintaining O2 on 5L [place] at 98%. Pt denies any trouble breathing. Pt states some rib pain and still has cough. Pt informed that I can give her another dose of pain medication around 11:15.  Will ask MD about morphine for pt.  Also informed pt of needing to call daughter and update.

## 2019-07-10 NOTE — ED Notes (Signed)
Updated daughter on pt's status after verbal consent given by pt

## 2019-07-10 NOTE — Progress Notes (Addendum)
PROGRESS NOTE    Jenna Tyler  [id number] DOB: Jun 23, 1965 DOA: 07/09/2019 PCP: [facility]    Brief Narrative:  Jenna Tyler is a 54 y.o. female with medical history significant of asthma/COPD, Hypertension, hypothyroidism in addition to medical problems noted below presents to ed with  Acute shortness of breath,  not relieved by home albuterol. Patient states she was in her normal state of health yesterday and today acute go ill. She notes no sick contact, does states she had mild cough that was nonproductive one day ago. However today notes she had fever and persistent sob. Temp 102, hr 133, bp 156/80  Sat 96% on [place] , rr 30'sPatient was placed on bipap to assist with work of breathing  cxr : note infiltrate  Patient treated with solumedrol as well as  CAP antibiotics in ed  Now on [place]    Consultants:     Procedures:   Antimicrobials:   Azithromycin and ceftriaxone   Subjective: Coughing. Has rib pain from coughing . Did tell her she can ask for tylenol for this type pain  Objective: Vitals:   07/10/19 1611 07/10/19 1615 07/10/19 1630 07/10/19 1700  BP: (!) 144/94  (!) 154/90 (!) 139/91  Pulse: (!) 112 (!) 102 (!) 103 98  Resp: 20 (!) 23 (!) 28 (!) 22  Temp: 98.5 F (36.9 C)     TempSrc: Oral     SpO2: 97% 99% 100% 100%  Weight:      Height:        Intake/Output Summary (Last 24 hours) at 07/10/2019 1756 Last data filed at 07/10/2019 0445 Gross per 24 hour  Intake --  Output 825 ml  Net -825 ml   Filed Weights   07/09/19 2126 07/10/19 0737  Weight: 78.9 kg 78.9 kg    Examination:  General exam: Appears calm and comfortable , nad Respiratory system: Clear to auscultation. Respiratory effort normal. Cardiovascular system: S1 & S2 heard, RRR. No JVD, murmurs, rubs, gallops or clicks Gastrointestinal system: Abdomen is nondistended, soft and nontender. Normal bowel sounds heard. Central nervous system: Alert and  oriented. No focal neurological deficits. Extremities: no edema Skin: warm dry Psychiatry: Judgement and insight appear normal. Mood & affect appropriate.     Data Reviewed: I have personally reviewed following labs and imaging studies  CBC: Recent Labs  Lab 07/09/19 2201 07/10/19 0611  WBC 16.8* 15.9*  NEUTROABS  --  14.8*  HGB 13.7 12.4  HCT 41.2 37.0  MCV 81.1 81.0  PLT 285 [id number]   Basic Metabolic Panel: Recent Labs  Lab 07/09/19 2201 07/10/19 0611  NA 136 140  K 3.9 3.6  CL 105 115*  CO2 20* 16*  GLUCOSE 137* 198*  BUN 14 11  CREATININE 0.85 0.63  CALCIUM 9.0 7.1*   GFR: Estimated Creatinine Clearance: 80.9 mL/min (by C-G formula based on SCr of 0.63 mg/dL). Liver Function Tests: Recent Labs  Lab 07/10/19 0611  AST 39  ALT 58*  ALKPHOS 83  BILITOT 0.5  PROT 5.3*  ALBUMIN 2.9*   No results for input(s): LIPASE, AMYLASE in the last 168 hours. No results for input(s): AMMONIA in the last 168 hours. Coagulation Profile: No results for input(s): INR, PROTIME in the last 168 hours. Cardiac Enzymes: No results for input(s): CKTOTAL, CKMB, CKMBINDEX, TROPONINI in the last 168 hours. BNP (last 3 results) No results for input(s): PROBNP in the last 8760 hours. HbA1C: No results for input(s): HGBA1C in the  last 72 hours. CBG: No results for input(s): GLUCAP in the last 168 hours. Lipid Profile: No results for input(s): CHOL, HDL, LDLCALC, TRIG, CHOLHDL, LDLDIRECT in the last 72 hours. Thyroid Function Tests: No results for input(s): TSH, T4TOTAL, FREET4, T3FREE, THYROIDAB in the last 72 hours. Anemia Panel: No results for input(s): VITAMINB12, FOLATE, FERRITIN, TIBC, IRON, RETICCTPCT in the last 72 hours. Sepsis Labs: Recent Labs  Lab 07/09/19 2201 07/09/19 2355  PROCALCITON <0.10  --   LATICACIDVEN 2.5* 2.1*    Recent Results (from the past 240 hour(s))  Respiratory Panel by RT PCR (Flu A&B, Covid) - Nasopharyngeal Swab     Status: None    Collection Time: 07/09/19 10:01 PM   Specimen: Nasopharyngeal Swab  Result Value Ref Range Status   SARS Coronavirus 2 by RT PCR NEGATIVE NEGATIVE Final    Comment: (NOTE) SARS-CoV-2 target nucleic acids are NOT DETECTED. The SARS-CoV-2 RNA is generally detectable in upper respiratoy specimens during the acute phase of infection. The lowest concentration of SARS-CoV-2 viral copies this assay can detect is 131 copies/mL. A negative result does not preclude SARS-Cov-2 infection and should not be used as the sole basis for treatment or other patient management decisions. A negative result may occur with  improper specimen collection/handling, submission of specimen other than nasopharyngeal swab, presence of viral mutation(s) within the areas targeted by this assay, and inadequate number of viral copies (<131 copies/mL). A negative result must be combined with clinical observations, patient history, and epidemiological information. The expected result is Negative. Fact Sheet for Patients:  PinkCheek.be Fact Sheet for Healthcare Providers:  GravelBags.it This test is not yet ap proved or cleared by the [place] FDA and  has been authorized for detection and/or diagnosis of SARS-CoV-2 by FDA under an Emergency Use Authorization (EUA). This EUA will remain  in effect (meaning this test can be used) for the duration of the COVID-19 declaration under Section 564(b)(1) of the Act, 21 U.S.C. section 360bbb-3(b)(1), unless the authorization is terminated or revoked sooner.    Influenza A by PCR NEGATIVE NEGATIVE Final   Influenza B by PCR NEGATIVE NEGATIVE Final    Comment: (NOTE) The Xpert Xpress SARS-CoV-2/FLU/RSV assay is intended as an aid in  the diagnosis of influenza from Nasopharyngeal swab specimens and  should not be used as a sole basis for treatment. Nasal washings and  aspirates are unacceptable for Xpert Xpress  SARS-CoV-2/FLU/RSV  testing. Fact Sheet for Patients: PinkCheek.be Fact Sheet for Healthcare Providers: GravelBags.it This test is not yet approved or cleared by the [place] FDA and  has been authorized for detection and/or diagnosis of SARS-CoV-2 by  FDA under an Emergency Use Authorization (EUA). This EUA will remain  in effect (meaning this test can be used) for the duration of the  Covid-19 declaration under Section 564(b)(1) of the Act, 21  U.S.C. section 360bbb-3(b)(1), unless the authorization is  terminated or revoked. Performed at [facility], [address] [place], [place] [address]   Blood Culture (routine x 2)     Status: None (Preliminary result)   Collection Time: 07/09/19 10:02 PM   Specimen: BLOOD  Result Value Ref Range Status   Specimen Description   Final    BLOOD BLOOD LEFT FOREARM Performed at [facility] [address] [place], [place] [address]    Special Requests   Final    BOTTLES DRAWN AEROBIC AND ANAEROBIC Blood Culture adequate volume Performed at [facility], [address] [place], [place] [address]    Culture   Final    NO GROWTH < 12 HOURS Performed at [facility], [address] [place], [place] [address]    Report Status PENDING  Incomplete  Blood Culture (routine x 2)     Status: None (Preliminary result)   Collection Time: 07/09/19 10:05 PM   Specimen: BLOOD  Result Value Ref Range Status   Specimen Description   Final    BLOOD BLOOD LEFT HAND Performed at [facility] [address] [place], [place] [address]    Special Requests   Final    BOTTLES DRAWN AEROBIC AND ANAEROBIC Blood Culture adequate volume Performed at [facility] [address] [place], [place] [address]    Culture   Final    NO GROWTH < 12 HOURS Performed at [facility], [address] [place], [place] [address]    Report Status PENDING  Incomplete         Radiology Studies: DG Chest 1 View  Result Date: 07/09/2019 CLINICAL DATA:  54 year old female with shortness of breath. EXAM: CHEST  1 VIEW COMPARISON:  Chest radiograph dated 08/28/2018. FINDINGS: There is diffuse interstitial coarsening. Patchy area of airspace density in the right upper lobe has progressed since the prior radiograph and concerning for infiltrate. Clinical correlation and follow-up to resolution recommended. There is no pleural effusion or pneumothorax. Stable cardiac silhouette. No acute osseous pathology. IMPRESSION: Interval progression of the right upper lobe airspace opacity concerning for pneumonia. Follow-up to resolution recommended. Electronically Signed   By: Anner Crete M.D.   On: 07/09/2019 22:04        Scheduled Meds: . albuterol  2.5 mg Nebulization Q6H  . enoxaparin (LOVENOX) injection  40 mg Subcutaneous Q24H  . sodium chloride flush  3 mL Intravenous Once   Continuous Infusions: . sodium chloride 75 mL/hr at 07/10/19 0929  . azithromycin Stopped (07/10/19 0100)  . cefTRIAXone (ROCEPHIN)  IV Stopped (07/09/19 2345)    Assessment & Plan:   Active Problems:   Sepsis ([place])  Sepsis -without shock On sepsis protocal 2/2 CAP Continue iv abx  Ck lactic acid   Community-acquired pneumonia. 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 - continue Synthroid   Dyslipidemia -continue statin therapy.  Depression. -Seroquel, Effexor XR and trazodone will be resumed.    DVT prophylaxis: lovenox Code Status:full Family Communication: none at  bedside Disposition Plan: back home in 1-2 days depending on respiratory status. Barrier: still requiring iv abx, needs iv steroid. Possible d/c in 1-2 days if able to transition to po abx and steroid.        LOS: 0 days   Time spent: 45 minutes with more than 50% COC    Nolberto Hanlon, MD [facility] Pager [phone]  If 7PM-7AM, please contact night-coverage [email or web address] Password [facility] 07/10/2019, 5:56 PM

## 2019-07-10 NOTE — ED Notes (Signed)
Informed RN Bri on pt's current O2 at 98% 5L [place] and that if pt is able to maintain, the level of care could possibly be adjusted accordingly for room placement.

## 2019-07-10 NOTE — ED Notes (Signed)
Pt c/o pain in rib cage and back with coughing, prn pain medication ordered

## 2019-07-10 NOTE — ED Notes (Signed)
Attempted to call report- RN busy. Extension provided for call-back.  

## 2019-07-10 NOTE — Progress Notes (Signed)
[place]  [phone] [phone]  ID: Jenna Tyler OB: 1965-12-07  MR#: [id number]  [id number]  Patient Care Team: [facility] as PCP - General  CHIEF COMPLAINT: Easy bruising, polycythemia.  INTERVAL HISTORY: Patient is a 54 year old female who was recently discharged from the hospital after having found community-acquired pneumonia as well as a COPD exacerbation.  Prior to admission she had complaints of easy bruising of unknown etiology as well as polycythemia.  She continues to have weakness and fatigue.  She also has a chronic cough and shortness of breath.  She feels improved since discharge.  She has no neurologic complaints.  She denies any recent fevers.  She has a fair appetite, but denies weight loss.  She has no chest pain or hemoptysis.  She denies any nausea, vomiting, constipation, or diarrhea.  She has no urinary complaints.  Patient otherwise feels well and offers no further specific complaints today.  REVIEW OF SYSTEMS:   Review of Systems  Constitutional: Positive for malaise/fatigue. Negative for fever and weight loss.  Respiratory: Positive for cough and shortness of breath. Negative for hemoptysis.   Cardiovascular: Negative.  Negative for chest pain and leg swelling.  Gastrointestinal: Negative.  Negative for abdominal pain.  Genitourinary: Negative.  Negative for dysuria.  Musculoskeletal: Negative.  Negative for back pain.  Skin: Negative.  Negative for rash.  Neurological: Negative.  Negative for dizziness, focal weakness, weakness and headaches.  Endo/Heme/Allergies: Does not bruise/bleed easily.    As per HPI. Otherwise, a complete review of systems is negative.  PAST MEDICAL HISTORY: Past Medical History:  Diagnosis Date  . Asthma   . Guillain Barr syndrome ([place])   . Hyperlipidemia   . Hypertension   . Hypothyroidism   . 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  Pancreatitis     PAST SURGICAL HISTORY: Past Surgical History:  Procedure Laterality Date  . ABDOMINAL HYSTERECTOMY    . BREAST BIOPSY Right 2012   negative  . CHOLECYSTECTOMY    . 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 OTHER SURGICAL HISTORY     excision of lipoma  . OTHER SURGICAL HISTORY     Abdominal surgery  . THYROID SURGERY     thyroidectomy    FAMILY HISTORY: Family History  Problem Relation Age of Onset  . Breast cancer Sister 77  . Breast cancer Maternal Aunt 39  . Breast cancer Maternal Grandmother 60  . Breast cancer Maternal Aunt 36  . Breast cancer Cousin 40       maternal side    ADVANCED DIRECTIVES (Y/N):  N  HEALTH MAINTENANCE: Social History   Tobacco Use  . Smoking status: Former Smoker    Quit date: 12/28/1997    Years since quitting: 21.5  . Smokeless tobacco: Never Used  Substance Use Topics  . Alcohol use: Not Currently    Alcohol/week: 0.0 standard drinks  . Drug use: No     Colonoscopy:  PAP:  Bone density:  Lipid panel:  Allergies  Allergen Reactions  . Nsaids Other (See Comments)    Internal bleeding  Intestinal bleeding  . Adhesive [Tape] Rash  . Aspirin Other (See Comments)    Cannot take due to ulcers.  . Penicillins Rash    Has patient had a PCN reaction causing immediate rash, facial/tongue/throat swelling, SOB or lightheadedness with hypotension: Yes Has patient had a PCN reaction causing severe rash involving mucus membranes or skin necrosis: No Has patient  had a PCN reaction that required hospitalization: No Has patient had a PCN reaction occurring within the last 10 years: Yes If all of the above answers are "NO", then may proceed with Cephalosporin use.     Current Outpatient Medications  Medication Sig Dispense Refill  . acetaminophen (TYLENOL) 325 MG tablet Take 2 tablets (650 mg total) by mouth every 6 (six) hours as needed for mild pain (or Fever >/= 101).    Marland Kitchen albuterol (VENTOLIN HFA) 108 (90 Base) MCG/ACT  inhaler Inhale 1-2 puffs into the lungs every 6 (six) hours as needed for wheezing or shortness of breath.     . chlorpheniramine-HYDROcodone (TUSSIONEX) 10-8 MG/5ML SUER Take 5 mLs by mouth every 12 (twelve) hours as needed.    . famotidine (PEPCID) 20 MG tablet Take 1 tablet (20 mg total) by mouth daily. 30 tablet 0  . gabapentin (NEURONTIN) 300 MG capsule Take 900-1,200 mg by mouth 4 (four) times daily. 900 mg in the morning, 900 mg in the afternoon, 900 mg in the evening, and 1200 mg at night    . guaiFENesin-codeine 100-10 MG/5ML syrup Take 10 mLs by mouth every 4 (four) hours as needed for up to 7 days for cough. 120 mL 0  . hydrOXYzine (VISTARIL) 50 MG capsule Take 50 mg by mouth every 6 (six) hours as needed for anxiety.     Marland Kitchen levothyroxine (SYNTHROID) 175 MCG tablet Take 175 mcg by mouth daily.    . QUEtiapine (SEROQUEL) 200 MG tablet Take 1 tablet (200 mg total) by mouth at bedtime. (Patient taking differently: Take 450 mg by mouth at bedtime. )    . QUEtiapine (SEROQUEL) 50 MG tablet Take 50 mg by mouth 3 (three) times daily.    Marland Kitchen tiZANidine (ZANAFLEX) 4 MG tablet Take 4 mg by mouth every 6 (six) hours as needed for muscle spasms.     Marland Kitchen venlafaxine XR (EFFEXOR-XR) 75 MG 24 hr capsule Take 225 mg by mouth daily.      No current facility-administered medications for this visit.    OBJECTIVE: Vitals:   07/15/19 1107  BP: (!) 141/87  Pulse: 91  Temp: 98.8 F (37.1 C)  SpO2: 99%     Body mass index is 31.19 kg/m.    ECOG FS:0 - Asymptomatic  General: Well-developed, well-nourished, no acute distress. Eyes: Pink conjunctiva, anicteric sclera. HEENT: Normocephalic, moist mucous membranes. Lungs: No audible wheezing or coughing. Heart: Regular rate and rhythm. Abdomen: Soft, nontender, no obvious distention. Musculoskeletal: No edema, cyanosis, or clubbing. Neuro: Alert, answering all questions appropriately. Cranial nerves grossly intact. Skin: No rashes or petechiae  noted. Psych: Normal affect. Lymphatics: No cervical, calvicular, axillary or inguinal LAD.   LAB RESULTS:  Lab Results  Component Value Date   NA 140 07/10/2019   K 3.6 07/10/2019   CL 115 (H) 07/10/2019   CO2 16 (L) 07/10/2019   GLUCOSE 198 (H) 07/10/2019   BUN 11 07/10/2019   CREATININE 0.63 07/10/2019   CALCIUM 7.1 (L) 07/10/2019   PROT 5.3 (L) 07/10/2019   ALBUMIN 2.9 (L) 07/10/2019   AST 39 07/10/2019   ALT 58 (H) 07/10/2019   ALKPHOS 83 07/10/2019   BILITOT 0.5 07/10/2019   GFRNONAA >60 07/10/2019   GFRAA >60 07/10/2019    Lab Results  Component Value Date   WBC 14.1 (H) 07/15/2019   NEUTROABS 14.8 (H) 07/10/2019   HGB 14.7 07/15/2019   HCT 43.1 07/15/2019   MCV 80.0 07/15/2019   PLT 419 (H)  07/15/2019     STUDIES: DG Chest 1 View  Result Date: 07/09/2019 CLINICAL DATA:  54 year old female with shortness of breath. EXAM: CHEST  1 VIEW COMPARISON:  Chest radiograph dated 08/28/2018. FINDINGS: There is diffuse interstitial coarsening. Patchy area of airspace density in the right upper lobe has progressed since the prior radiograph and concerning for infiltrate. Clinical correlation and follow-up to resolution recommended. There is no pleural effusion or pneumothorax. Stable cardiac silhouette. No acute osseous pathology. IMPRESSION: Interval progression of the right upper lobe airspace opacity concerning for pneumonia. Follow-up to resolution recommended. Electronically Signed   By: Anner Crete M.D.   On: 07/09/2019 22:04    ASSESSMENT: Easy bruising, polycythemia.  PLAN:    1. Easy bruising: Unclear etiology.  Patient's PT/INR and PTT are within normal limits.  She has a mild thrombocytosis which is likely reactive.  Platelet function assay and von Willebrand panel are pending at time of dictation.  Patient will have video assisted telemedicine visit in approximately 3 weeks for further evaluation and discussion of her laboratory results. 2.  Polycythemia:  Resolved.  Patient's hemoglobin is within normal limits today.  Iron panel is also within normal limits.  Hemochromatosis mutation, carbon monoxide level, erythropoietin were drawn for completeness and are pending at time of dictation.  No intervention is needed at this time.  Patient does not require any treatment or phlebotomy. 3.  Leukocytosis: Likely residual from recent bout of pneumonia and steroid use. 4.  Pneumonia: Patient reports she has follow-up with pulmonary later this afternoon.  Patient expressed understanding and was in agreement with this plan. She also understands that She can call clinic at any time with any questions, concerns, or complaints.    Lloyd Huger, MD   07/15/2019 3:57 PM

## 2019-07-10 NOTE — Progress Notes (Addendum)
SLP Cancellation Note  Patient Details Name: Jenna Tyler MRN: [id number] DOB: 12/07/1965   Cancelled treatment:       Reason Eval/Treat Not Completed: (chart reviewed; consulted NSG/MD). SLP consult received; noted CXR revealing "Interval progression of the right upper lobe airspace opacity concerning for pneumonia". Pt has had a similar presentation in 2020 on a CXR. MBSS ordered vs BSE to objectively assess swallow function. NSG updated/agreed. ST services will f/u this PM.      Orinda Kenner, MS, CCC-SLP [name] 07/10/2019, 1:49 PM

## 2019-07-10 NOTE — ED Notes (Signed)
Pt up to toilet. Pt ambulatory with steady gait.

## 2019-07-10 NOTE — ED Notes (Signed)
CCU requesting downgrading- NP Stark Klein and MD [facility] messaged regarding request. Awaiting response.

## 2019-07-10 NOTE — ED Notes (Signed)
Clean brief placed on pt and pt back in bed resting at this time.

## 2019-07-10 NOTE — ED Notes (Signed)
Pt given breakfast tray. Pt taken off BIPAP at this time and placed on 5L [place]. Pt maintaining O2 at 97%.  RT aware that BIPAP is on standby at this time.

## 2019-07-10 NOTE — ED Notes (Signed)
Patient was incontinent due to coughing. Patient was placed on portable O2 tank and patient walked to hallway bathroom. Patient's linens and gown were changed. Patient was placed in a brief. Patient declined Purewick.

## 2019-07-10 NOTE — ED Notes (Signed)
Report given to inpatient RN.

## 2019-07-10 NOTE — ED Notes (Signed)
Pt given lunch tray.

## 2019-07-10 NOTE — Progress Notes (Signed)
SLP Cancellation Note  Patient Details Name: Jenna Tyler MRN: [id number] DOB: [birth year]   Cancelled treatment:       Reason Eval/Treat Not Completed: Patient declined, no reason specified Patient does not think she needs a swallowing assessment.  Leroy Sea, MS/CCC- SLP  Valetta Fuller, Daine Floras 07/10/2019, 3:00 PM

## 2019-07-11 DIAGNOSIS — R0602 Shortness of breath: Secondary | ICD-10-CM

## 2019-07-11 LAB — CBC
HCT: 39.6 % (ref 36.0–46.0)
Hemoglobin: 13.2 g/dL (ref 12.0–15.0)
MCH: 27.3 pg (ref 26.0–34.0)
MCHC: 33.3 g/dL (ref 30.0–36.0)
MCV: 81.8 fL (ref 80.0–100.0)
Platelets: 285 10*3/uL (ref 150–400)
RBC: 4.84 MIL/uL (ref 3.87–5.11)
RDW: 14.6 % (ref 11.5–15.5)
WBC: 15.1 10*3/uL — ABNORMAL HIGH (ref 4.0–10.5)
nRBC: 0 % (ref 0.0–0.2)

## 2019-07-11 LAB — GLUCOSE, CAPILLARY
Glucose-Capillary: 179 mg/dL — ABNORMAL HIGH (ref 70–99)
Glucose-Capillary: 132 mg/dL — ABNORMAL HIGH (ref 70–99)

## 2019-07-11 LAB — HEMOGLOBIN A1C
Hgb A1c MFr Bld: 6.3 % — ABNORMAL HIGH (ref 4.8–5.6)
Mean Plasma Glucose: 134.11 mg/dL

## 2019-07-11 LAB — LACTIC ACID, PLASMA: Lactic Acid, Venous: 1.2 mmol/L (ref 0.5–1.9)

## 2019-07-11 LAB — HIV ANTIBODY (ROUTINE TESTING W REFLEX): HIV Screen 4th Generation wRfx: NONREACTIVE

## 2019-07-11 MED ORDER — GABAPENTIN 300 MG PO CAPS
300.0000 mg | ORAL_CAPSULE | Freq: Every day | ORAL | Status: DC
  Administered 2019-07-11: 11:00:00 300 mg via ORAL
  Filled 2019-07-11: qty 1

## 2019-07-11 MED ORDER — FAMOTIDINE 20 MG PO TABS
20.0000 mg | ORAL_TABLET | Freq: Every day | ORAL | Status: DC
  Administered 2019-07-11: 09:00:00 20 mg via ORAL
  Filled 2019-07-11: qty 1

## 2019-07-11 MED ORDER — FAMOTIDINE 20 MG PO TABS: 20.0000 mg | ORAL_TABLET | Freq: Every day | ORAL | 0 refills | Status: DC

## 2019-07-11 MED ORDER — GUAIFENESIN-CODEINE 100-10 MG/5ML PO SOLN: 10.0000 mL | ORAL | 0 refills | Status: AC | PRN

## 2019-07-11 MED ORDER — PREDNISONE 20 MG PO TABS: 40.0000 mg | ORAL_TABLET | Freq: Every day | ORAL | 0 refills | Status: DC

## 2019-07-11 MED ORDER — LEVOFLOXACIN 500 MG PO TABS: 500.0000 mg | ORAL_TABLET | Freq: Every day | ORAL | 0 refills | Status: DC

## 2019-07-11 MED ORDER — PREDNISONE 20 MG PO TABS: 20.0000 mg | ORAL_TABLET | Freq: Every day | ORAL | 0 refills | Status: DC

## 2019-07-11 NOTE — Progress Notes (Signed)
Patient ambulated to nurses station and back to room, O2 sats stayed between 96-99% on room air.

## 2019-07-11 NOTE — Plan of Care (Signed)

## 2019-07-11 NOTE — Discharge Summary (Signed)
Jenna Tyler [id number] DOB: [birth year] DOA: 07/09/2019  PCP: [facility] date: 07/09/2019 Discharge date: 07/11/2019  Admitted From: home Disposition:  home  Recommendations for Outpatient Follow-up:  1. Follow up with PCP in 1 week 2. Please obtain BMP/CBC in one week 3. F/u blood cultures   Home Health:yes   Discharge Condition:Stable CODE STATUS:full Diet recommendation: Heart Healthy  Brief/Interim Summary: Jenna Tyler a 54 y.o.femalewith medical history significant ofasthma/COPD, Hypertension, hypothyroidism in addition to medical problems noted below presents to ed with Acute shortness of breath, not relieved by homealbuterol.Found with  Temp 102, hr 133, bp 156/80 Sat 96% on [place] , rr 30'sPatient was placed on bipap to assist with work of breathing . She had a chest x-ray as stated below.  She was treated with Solu-Medrol and antibiotics for community-acquired pneumonia.  Respiratory panel was negative.  She was treated for COPD exacerbation.  She was found with sepsis without shock due to community-acquired pneumonia.  She was weaned down to nasal cannula and then off of oxygen she has been satting in the 90s.  She is stable to be discharged home.   Discharge Diagnoses:  Active Problems:   Sepsis [facility])    Discharge Instructions  Discharge Instructions    Call MD for:  temperature >100.4   Complete by: As directed    Diet - low sodium heart healthy   Complete by: As directed    Discharge instructions   Complete by: As directed    Follow up with pcp   Increase activity slowly   Complete by: As directed      Allergies as of 07/11/2019      Reactions   Nsaids Other (See Comments)   Internal bleeding  Intestinal bleeding   Adhesive [tape] Rash   Aspirin Other (See Comments)   Cannot take due to ulcers.   Penicillins Rash   Has patient had a PCN reaction causing immediate rash, facial/tongue/throat  swelling, SOB or lightheadedness with hypotension: Yes Has patient had a PCN reaction causing severe rash involving mucus membranes or skin necrosis: No Has patient had a PCN reaction that required hospitalization: No Has patient had a PCN reaction occurring within the last 10 years: Yes If all of the above answers are "NO", then may proceed with Cephalosporin use.      Medication List    TAKE these medications   acetaminophen 325 MG tablet Commonly known as: TYLENOL Take 2 tablets (650 mg total) by mouth every 6 (six) hours as needed for mild pain (or Fever >/= 101).   famotidine 20 MG tablet Commonly known as: PEPCID Take 1 tablet (20 mg total) by mouth daily. Start taking on: July 12, 2019   gabapentin 300 MG capsule Commonly known as: NEURONTIN Take 900-1,200 mg by mouth 4 (four) times daily. 900 mg in the morning, 900 mg in the afternoon, 900 mg in the evening, and 1200 mg at night   guaiFENesin-codeine 100-10 MG/5ML syrup Take 10 mLs by mouth every 4 (four) hours as needed for up to 7 days for cough.   hydrOXYzine 50 MG capsule Commonly known as: VISTARIL Take 50 mg by mouth every 6 (six) hours as needed for anxiety.   levofloxacin 500 MG tablet Commonly known as: LEVAQUIN Take 1 tablet (500 mg total) by mouth daily for 4 days.   levothyroxine 175 MCG tablet Commonly known as: SYNTHROID Take 175 mcg by mouth daily.   predniSONE 20 MG tablet  Commonly known as: Deltasone Take 2 tablets (40 mg total) by mouth daily for 3 days. Start taking on: July 12, 2019   predniSONE 20 MG tablet Commonly known as: Deltasone Take 1 tablet (20 mg total) by mouth daily for 3 days. Start taking on: July 15, 2019   QUEtiapine 50 MG tablet Commonly known as: SEROQUEL Take 50 mg by mouth 3 (three) times daily. What changed: Another medication with the same name was changed. Make sure you understand how and when to take each.   QUEtiapine 200 MG tablet Commonly known as:  SEROQUEL Take 1 tablet (200 mg total) by mouth at bedtime. What changed: how much to take   tiZANidine 4 MG tablet Commonly known as: ZANAFLEX Take 4 mg by mouth every 6 (six) hours as needed for muscle spasms.   venlafaxine XR 75 MG 24 hr capsule Commonly known as: EFFEXOR-XR Take 225 mg by mouth daily.   Ventolin HFA 108 (90 Base) MCG/ACT inhaler Generic drug: albuterol Inhale 1-2 puffs into the lungs every 6 (six) hours as needed for wheezing or shortness of breath.       Allergies  Allergen Reactions  . Nsaids Other (See Comments)    Internal bleeding  Intestinal bleeding  . Adhesive [Tape] Rash  . Aspirin Other (See Comments)    Cannot take due to ulcers.  . Penicillins Rash    Has patient had a PCN reaction causing immediate rash, facial/tongue/throat swelling, SOB or lightheadedness with hypotension: Yes Has patient had a PCN reaction causing severe rash involving mucus membranes or skin necrosis: No Has patient had a PCN reaction that required hospitalization: No Has patient had a PCN reaction occurring within the last 10 years: Yes If all of the above answers are "NO", then may proceed with Cephalosporin use.     Consultations:  None   Procedures/Studies: DG Chest 1 View  Result Date: 07/09/2019 CLINICAL DATA:  54 year old female with shortness of breath. EXAM: CHEST  1 VIEW COMPARISON:  Chest radiograph dated 08/28/2018. FINDINGS: There is diffuse interstitial coarsening. Patchy area of airspace density in the right upper lobe has progressed since the prior radiograph and concerning for infiltrate. Clinical correlation and follow-up to resolution recommended. There is no pleural effusion or pneumothorax. Stable cardiac silhouette. No acute osseous pathology. IMPRESSION: Interval progression of the right upper lobe airspace opacity concerning for pneumonia. Follow-up to resolution recommended. Electronically Signed   By: Anner Crete M.D.   On: 07/09/2019  22:04       Subjective: Feeling better.  Ambulated satting in the 90s without oxygen.  No complaints  Discharge Exam: Vitals:   07/11/19 0857 07/11/19 1014  BP: 124/77   Pulse: 80   Resp: 17   Temp: 97.7 F (36.5 C)   SpO2: 98% 97%   Vitals:   07/11/19 0203 07/11/19 0807 07/11/19 0857 07/11/19 1014  BP: (!) 142/86  124/77   Pulse: 95  80   Resp: 20  17   Temp: 98.1 F (36.7 C)  97.7 F (36.5 C)   TempSrc: Oral  Oral   SpO2: 95% 99% 98% 97%  Weight:      Height:        General: Pt is alert, awake, not in acute distress Cardiovascular: RRR, S1/S2 +, no rubs, no gallops Respiratory: CTA bilaterally, no wheezing, no rhonchi Abdominal: Soft, NT, ND, bowel sounds + Extremities: no edema, no cyanosis    The results of significant diagnostics from this hospitalization (including imaging, microbiology,  ancillary and laboratory) are listed below for reference.     Microbiology: Recent Results (from the past 240 hour(s))  Respiratory Panel by RT PCR (Flu A&B, Covid) - Nasopharyngeal Swab     Status: None   Collection Time: 07/09/19 10:01 PM   Specimen: Nasopharyngeal Swab  Result Value Ref Range Status   SARS Coronavirus 2 by RT PCR NEGATIVE NEGATIVE Final    Comment: (NOTE) SARS-CoV-2 target nucleic acids are NOT DETECTED. The SARS-CoV-2 RNA is generally detectable in upper respiratoy specimens during the acute phase of infection. The lowest concentration of SARS-CoV-2 viral copies this assay can detect is 131 copies/mL. A negative result does not preclude SARS-Cov-2 infection and should not be used as the sole basis for treatment or other patient management decisions. A negative result may occur with  improper specimen collection/handling, submission of specimen other than nasopharyngeal swab, presence of viral mutation(s) within the areas targeted by this assay, and inadequate number of viral copies (<131 copies/mL). A negative result must be combined with  clinical observations, patient history, and epidemiological information. The expected result is Negative. Fact Sheet for Patients:  PinkCheek.be Fact Sheet for Healthcare Providers:  GravelBags.it This test is not yet ap proved or cleared by the [place] FDA and  has been authorized for detection and/or diagnosis of SARS-CoV-2 by FDA under an Emergency Use Authorization (EUA). This EUA will remain  in effect (meaning this test can be used) for the duration of the COVID-19 declaration under Section 564(b)(1) of the Act, 21 U.S.C. section 360bbb-3(b)(1), unless the authorization is terminated or revoked sooner.    Influenza A by PCR NEGATIVE NEGATIVE Final   Influenza B by PCR NEGATIVE NEGATIVE Final    Comment: (NOTE) The Xpert Xpress SARS-CoV-2/FLU/RSV assay is intended as an aid in  the diagnosis of influenza from Nasopharyngeal swab specimens and  should not be used as a sole basis for treatment. Nasal washings and  aspirates are unacceptable for Xpert Xpress SARS-CoV-2/FLU/RSV  testing. Fact Sheet for Patients: PinkCheek.be Fact Sheet for Healthcare Providers: GravelBags.it This test is not yet approved or cleared by the [place] FDA and  has been authorized for detection and/or diagnosis of SARS-CoV-2 by  FDA under an Emergency Use Authorization (EUA). This EUA will remain  in effect (meaning this test can be used) for the duration of the  Covid-19 declaration under Section 564(b)(1) of the Act, 21  U.S.C. section 360bbb-3(b)(1), unless the authorization is  terminated or revoked. Performed at [facility], [address] [place], [place] [address]   Blood Culture (routine x 2)     Status: None (Preliminary result)   Collection Time: 07/09/19 10:02 PM   Specimen: BLOOD  Result Value Ref Range Status   Specimen Description   Final    BLOOD  BLOOD LEFT FOREARM Performed at [facility] [address] [place], [place] [address]    Special Requests   Final    BOTTLES DRAWN AEROBIC AND ANAEROBIC Blood Culture adequate volume Performed at [facility] [address], [place], [place] [address]    Culture   Final    NO GROWTH 2 DAYS Performed at [facility], [address] [place], [place] [address]    Report Status PENDING  Incomplete  Blood Culture (routine x 2)     Status: None (Preliminary result)   Collection Time: 07/09/19 10:05 PM   Specimen: BLOOD  Result Value Ref Range Status   Specimen Description  Final    BLOOD BLOOD LEFT HAND Performed at [facility] [address] [place], [place] [address]    Special Requests   Final    BOTTLES DRAWN AEROBIC AND ANAEROBIC Blood Culture adequate volume Performed at [facility] [address] [place], [place] [address]    Culture   Final    NO GROWTH 2 DAYS Performed at [facility], [address] [place], [place] [address]    Report Status PENDING  Incomplete     Labs: BNP (last 3 results) Recent Labs    07/09/19 2201  BNP 0000000   Basic Metabolic Panel: Recent Labs  Lab 07/09/19 2201 07/10/19 0611  NA 136 140  K 3.9 3.6  CL 105 115*  CO2 20* 16*  GLUCOSE 137* 198*  BUN 14 11  CREATININE 0.85 0.63  CALCIUM 9.0 7.1*   Liver Function Tests: Recent Labs  Lab 07/10/19 0611  AST 39  ALT 58*  ALKPHOS 83  BILITOT 0.5  PROT 5.3*  ALBUMIN 2.9*   No results for input(s): LIPASE, AMYLASE in the last 168 hours. No results for input(s): AMMONIA in the last 168 hours. CBC: Recent Labs  Lab 07/09/19 2201 07/10/19 0611 07/11/19 0529  WBC 16.8* 15.9* 15.1*  NEUTROABS  --  14.8*  --   HGB 13.7 12.4 13.2  HCT 41.2 37.0 39.6  MCV 81.1 81.0 81.8  PLT 285 244 285   Cardiac Enzymes: No results for input(s): CKTOTAL, CKMB, CKMBINDEX, TROPONINI in the  last 168 hours. BNP: Invalid input(s): POCBNP CBG: Recent Labs  Lab 07/10/19 2101 07/11/19 0242 07/11/19 0859  GLUCAP 111* 179* 132*   D-Dimer No results for input(s): DDIMER in the last 72 hours. Hgb A1c Recent Labs    07/10/19 0611  HGBA1C 6.3*   Lipid Profile No results for input(s): CHOL, HDL, LDLCALC, TRIG, CHOLHDL, LDLDIRECT in the last 72 hours. Thyroid function studies No results for input(s): TSH, T4TOTAL, T3FREE, THYROIDAB in the last 72 hours.  Invalid input(s): FREET3 Anemia work up No results for input(s): VITAMINB12, FOLATE, FERRITIN, TIBC, IRON, RETICCTPCT in the last 72 hours. Urinalysis    Component Value Date/Time   COLORURINE YELLOW 07/09/2019 2355   APPEARANCEUR CLEAR 07/09/2019 2355   APPEARANCEUR Clear 03/28/2014 1540   LABSPEC 1.010 07/09/2019 2355   LABSPEC 1.021 03/28/2014 1540   PHURINE 5.5 07/09/2019 2355   GLUCOSEU NEGATIVE 07/09/2019 2355   GLUCOSEU Negative 03/28/2014 1540   HGBUR NEGATIVE 07/09/2019 2355   BILIRUBINUR NEGATIVE 07/09/2019 2355   BILIRUBINUR Negative 03/28/2014 1540   KETONESUR NEGATIVE 07/09/2019 2355   PROTEINUR NEGATIVE 07/09/2019 2355   NITRITE NEGATIVE 07/09/2019 2355   LEUKOCYTESUR TRACE (A) 07/09/2019 2355   LEUKOCYTESUR 1+ 03/28/2014 1540   Sepsis Labs Invalid input(s): PROCALCITONIN,  WBC,  LACTICIDVEN Microbiology Recent Results (from the past 240 hour(s))  Respiratory Panel by RT PCR (Flu A&B, Covid) - Nasopharyngeal Swab     Status: None   Collection Time: 07/09/19 10:01 PM   Specimen: Nasopharyngeal Swab  Result Value Ref Range Status   SARS Coronavirus 2 by RT PCR NEGATIVE NEGATIVE Final    Comment: (NOTE) SARS-CoV-2 target nucleic acids are NOT DETECTED. The SARS-CoV-2 RNA is generally detectable in upper respiratoy specimens during the acute phase of infection. The lowest concentration of SARS-CoV-2 viral copies this assay can detect is 131 copies/mL. A negative result does not preclude  SARS-Cov-2 infection and should not be used as the sole basis for  treatment or other patient management decisions. A negative result may occur with  improper specimen collection/handling, submission of specimen other than nasopharyngeal swab, presence of viral mutation(s) within the areas targeted by this assay, and inadequate number of viral copies (<131 copies/mL). A negative result must be combined with clinical observations, patient history, and epidemiological information. The expected result is Negative. Fact Sheet for Patients:  PinkCheek.be Fact Sheet for Healthcare Providers:  GravelBags.it This test is not yet ap proved or cleared by the [place] FDA and  has been authorized for detection and/or diagnosis of SARS-CoV-2 by FDA under an Emergency Use Authorization (EUA). This EUA will remain  in effect (meaning this test can be used) for the duration of the COVID-19 declaration under Section 564(b)(1) of the Act, 21 U.S.C. section 360bbb-3(b)(1), unless the authorization is terminated or revoked sooner.    Influenza A by PCR NEGATIVE NEGATIVE Final   Influenza B by PCR NEGATIVE NEGATIVE Final    Comment: (NOTE) The Xpert Xpress SARS-CoV-2/FLU/RSV assay is intended as an aid in  the diagnosis of influenza from Nasopharyngeal swab specimens and  should not be used as a sole basis for treatment. Nasal washings and  aspirates are unacceptable for Xpert Xpress SARS-CoV-2/FLU/RSV  testing. Fact Sheet for Patients: PinkCheek.be Fact Sheet for Healthcare Providers: GravelBags.it This test is not yet approved or cleared by the [place] FDA and  has been authorized for detection and/or diagnosis of SARS-CoV-2 by  FDA under an Emergency Use Authorization (EUA). This EUA will remain  in effect (meaning this test can be used) for the duration of the  Covid-19  declaration under Section 564(b)(1) of the Act, 21  U.S.C. section 360bbb-3(b)(1), unless the authorization is  terminated or revoked. Performed at [facility], [address] [place], [place] [address]   Blood Culture (routine x 2)     Status: None (Preliminary result)   Collection Time: 07/09/19 10:02 PM   Specimen: BLOOD  Result Value Ref Range Status   Specimen Description   Final    BLOOD BLOOD LEFT FOREARM Performed at [facility] [address] [place], [place] [address]    Special Requests   Final    BOTTLES DRAWN AEROBIC AND ANAEROBIC Blood Culture adequate volume Performed at [facility] [address], [place], [place] [address]    Culture   Final    NO GROWTH 2 DAYS Performed at [facility], [address] [place], [place] [address]    Report Status PENDING  Incomplete  Blood Culture (routine x 2)     Status: None (Preliminary result)   Collection Time: 07/09/19 10:05 PM   Specimen: BLOOD  Result Value Ref Range Status   Specimen Description   Final    BLOOD BLOOD LEFT HAND Performed at [facility] [address], [place], [place] [address]    Special Requests   Final    BOTTLES DRAWN AEROBIC AND ANAEROBIC Blood Culture adequate volume Performed at [facility] [address], [place], [place] [address]    Culture   Final    NO GROWTH 2 DAYS Performed at [facility], [address] [place],  [address]    Report Status PENDING  Incomplete   Community-acquired pneumonia. -IVantibiotics..>switch to po to complete the course -respiratory panel negative  -pulmonary toilet  -weaned off oxygen.   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 -continue Synthroid   Dyslipidemia -continue statin therapy.  Depression. -Seroquel, Effexor XR and trazodone will be resumed.  Time coordinating discharge: Over 30 minutes  SIGNED:   Nolberto Hanlon, MD  [facility] 07/11/2019, 1:38 PM Pager   If 7PM-7AM, please contact night-coverage [email or web address] Password [email or web address]

## 2019-07-11 NOTE — Progress Notes (Signed)
Patient discharging home, instructions given to patient, verbalized understanding. Family to come pick up patient and transport home.

## 2019-07-13 ENCOUNTER — Ambulatory Visit: Admission: RE | Admit: 2019-07-13 | Payer: BC Managed Care – PPO | Source: Ambulatory Visit

## 2019-07-14 ENCOUNTER — Other Ambulatory Visit: Payer: Self-pay

## 2019-07-14 ENCOUNTER — Encounter: Payer: Self-pay | Admitting: Oncology

## 2019-07-14 DIAGNOSIS — N361 Urethral diverticulum: Secondary | ICD-10-CM | POA: Insufficient documentation

## 2019-07-14 DIAGNOSIS — J302 Other seasonal allergic rhinitis: Secondary | ICD-10-CM | POA: Insufficient documentation

## 2019-07-14 DIAGNOSIS — Z13220 Encounter for screening for lipoid disorders: Secondary | ICD-10-CM | POA: Insufficient documentation

## 2019-07-14 DIAGNOSIS — F4320 Adjustment disorder, unspecified: Secondary | ICD-10-CM | POA: Insufficient documentation

## 2019-07-14 DIAGNOSIS — R519 Headache, unspecified: Secondary | ICD-10-CM | POA: Insufficient documentation

## 2019-07-14 DIAGNOSIS — R110 Nausea: Secondary | ICD-10-CM | POA: Insufficient documentation

## 2019-07-14 DIAGNOSIS — N63 Unspecified lump in unspecified breast: Secondary | ICD-10-CM | POA: Insufficient documentation

## 2019-07-14 DIAGNOSIS — R0602 Shortness of breath: Secondary | ICD-10-CM | POA: Insufficient documentation

## 2019-07-14 DIAGNOSIS — F439 Reaction to severe stress, unspecified: Secondary | ICD-10-CM | POA: Insufficient documentation

## 2019-07-14 DIAGNOSIS — N858 Other specified noninflammatory disorders of uterus: Secondary | ICD-10-CM | POA: Insufficient documentation

## 2019-07-14 DIAGNOSIS — R0681 Apnea, not elsewhere classified: Secondary | ICD-10-CM | POA: Insufficient documentation

## 2019-07-14 DIAGNOSIS — G894 Chronic pain syndrome: Secondary | ICD-10-CM | POA: Insufficient documentation

## 2019-07-14 DIAGNOSIS — J069 Acute upper respiratory infection, unspecified: Secondary | ICD-10-CM | POA: Insufficient documentation

## 2019-07-14 DIAGNOSIS — Z23 Encounter for immunization: Secondary | ICD-10-CM | POA: Insufficient documentation

## 2019-07-14 LAB — CULTURE, BLOOD (ROUTINE X 2)
Culture: NO GROWTH
Culture: NO GROWTH
Special Requests: ADEQUATE
Special Requests: ADEQUATE

## 2019-07-14 NOTE — Progress Notes (Signed)
Patient recently hospitalized for pneumonia.

## 2019-07-15 ENCOUNTER — Inpatient Hospital Stay: Payer: BC Managed Care – PPO

## 2019-07-15 ENCOUNTER — Encounter: Payer: Self-pay | Admitting: Oncology

## 2019-07-15 ENCOUNTER — Inpatient Hospital Stay: Payer: BC Managed Care – PPO | Attending: Oncology | Admitting: Oncology

## 2019-07-15 VITALS — BP 141/87 | HR 91 | Temp 98.8°F | Wt 176.1 lb

## 2019-07-15 DIAGNOSIS — Z87891 Personal history of nicotine dependence: Secondary | ICD-10-CM | POA: Diagnosis not present

## 2019-07-15 DIAGNOSIS — R5383 Other fatigue: Secondary | ICD-10-CM | POA: Diagnosis not present

## 2019-07-15 DIAGNOSIS — E785 Hyperlipidemia, unspecified: Secondary | ICD-10-CM | POA: Diagnosis not present

## 2019-07-15 DIAGNOSIS — D751 Secondary polycythemia: Secondary | ICD-10-CM | POA: Insufficient documentation

## 2019-07-15 DIAGNOSIS — Z803 Family history of malignant neoplasm of breast: Secondary | ICD-10-CM | POA: Diagnosis not present

## 2019-07-15 DIAGNOSIS — J440 Chronic obstructive pulmonary disease with acute lower respiratory infection: Secondary | ICD-10-CM | POA: Insufficient documentation

## 2019-07-15 DIAGNOSIS — R238 Other skin changes: Secondary | ICD-10-CM

## 2019-07-15 DIAGNOSIS — E039 Hypothyroidism, unspecified: Secondary | ICD-10-CM | POA: Diagnosis not present

## 2019-07-15 DIAGNOSIS — I10 Essential (primary) hypertension: Secondary | ICD-10-CM

## 2019-07-15 DIAGNOSIS — R531 Weakness: Secondary | ICD-10-CM | POA: Diagnosis not present

## 2019-07-15 DIAGNOSIS — Z79899 Other long term (current) drug therapy: Secondary | ICD-10-CM | POA: Diagnosis not present

## 2019-07-15 DIAGNOSIS — R233 Spontaneous ecchymoses: Secondary | ICD-10-CM

## 2019-07-15 LAB — CBC
HCT: 43.1 % (ref 36.0–46.0)
Hemoglobin: 14.7 g/dL (ref 12.0–15.0)
MCH: 27.3 pg (ref 26.0–34.0)
MCHC: 34.1 g/dL (ref 30.0–36.0)
MCV: 80 fL (ref 80.0–100.0)
Platelets: 419 10*3/uL — ABNORMAL HIGH (ref 150–400)
RBC: 5.39 MIL/uL — ABNORMAL HIGH (ref 3.87–5.11)
RDW: 14.4 % (ref 11.5–15.5)
WBC: 14.1 10*3/uL — ABNORMAL HIGH (ref 4.0–10.5)
nRBC: 0 % (ref 0.0–0.2)

## 2019-07-15 LAB — PROTIME-INR
INR: 0.9 (ref 0.8–1.2)
Prothrombin Time: 12 seconds (ref 11.4–15.2)

## 2019-07-15 LAB — APTT: aPTT: 33 seconds (ref 24–36)

## 2019-07-15 LAB — IRON AND TIBC
Iron: 78 ug/dL (ref 28–170)
Saturation Ratios: 22 % (ref 10.4–31.8)
TIBC: 356 ug/dL (ref 250–450)
UIBC: 278 ug/dL

## 2019-07-15 LAB — PLATELET FUNCTION ASSAY: Collagen / Epinephrine: 123 seconds (ref 0–193)

## 2019-07-15 LAB — FERRITIN: Ferritin: 34 ng/mL (ref 11–307)

## 2019-07-15 NOTE — Progress Notes (Signed)
Patient states she is experiencing some chest pain today. Rates pain between 4 - 5. Duration 7-8 hrs. States recently in hospital for pneumonia and discharged Saturday.

## 2019-07-16 LAB — ERYTHROPOIETIN: Erythropoietin: 9.8 m[IU]/mL (ref 2.6–18.5)

## 2019-07-16 LAB — CARBON MONOXIDE, BLOOD (PERFORMED AT REF LAB): Carbon Monoxide, Blood: 1.5 % (ref 0.0–3.6)

## 2019-07-22 LAB — HEMOCHROMATOSIS DNA-PCR(C282Y,H63D)

## 2019-07-22 LAB — VON WILLEBRAND PANEL
Coagulation Factor VIII: 141 % — ABNORMAL HIGH (ref 56–140)
Ristocetin Co-factor, Plasma: 38 % — ABNORMAL LOW (ref 50–200)
Von Willebrand Antigen, Plasma: 93 % (ref 50–200)

## 2019-07-22 LAB — COAG STUDIES INTERP REPORT

## 2019-07-23 ENCOUNTER — Ambulatory Visit
Admission: RE | Admit: 2019-07-23 | Discharge: 2019-07-23 | Disposition: A | Discharge status: Home / Self Care | Payer: BC Managed Care – PPO | Source: Ambulatory Visit | Attending: Family Medicine | Admitting: Family Medicine

## 2019-07-23 ENCOUNTER — Other Ambulatory Visit: Payer: Self-pay

## 2019-07-23 DIAGNOSIS — G4452 New daily persistent headache (NDPH): Secondary | ICD-10-CM | POA: Diagnosis not present

## 2019-07-23 MED ORDER — GADOBUTROL 1 MMOL/ML IV SOLN
7.0000 mL | Freq: Once | INTRAVENOUS | Status: AC | PRN
  Administered 2019-07-23: 7 mL via INTRAVENOUS

## 2019-07-31 NOTE — Progress Notes (Signed)
[facility] [facility]  [phone] [phone]  ID: Jenna Tyler OB: [birth year]  MR#: [id number]  XQJ#:[id number]  Patient Care Team: [facility] as PCP - General  I connected with Jenna Tyler on 08/05/19 at  2:45 PM EDT by video enabled telemedicine visit and verified that I am speaking with the correct person using two identifiers.   I discussed the limitations, risks, security and privacy concerns of performing an evaluation and management service by telemedicine and the availability of in-person appointments. I also discussed with the patient that there may be a patient responsible charge related to this service. The patient expressed understanding and agreed to proceed.   Other persons participating in the visit and their role in the encounter: Patient, MD.  Patient's location: Home. Provider's location: Clinic.  CHIEF COMPLAINT: Easy bruising, polycythemia.  INTERVAL HISTORY: Patient agreed to video assisted telemedicine visit for further evaluation and discussion of her laboratory results.  She is anxious about the results of her MRI ordered by a different provider, but otherwise feels well and is asymptomatic.  She does not complain of any weakness or fatigue.  She has no neurologic complaints.  She denies any recent fevers.  She has a fair appetite, but denies weight loss.  She denies any chest pain, shortness of breath, cough, or hemoptysis.  She denies any nausea, vomiting, constipation, or diarrhea.  She has no urinary complaints.  Patient offers no further specific complaints today.  REVIEW OF SYSTEMS:   Review of Systems  Constitutional: Negative.  Negative for fever, malaise/fatigue and weight loss.  Respiratory: Negative.  Negative for cough, hemoptysis and shortness of breath.   Cardiovascular: Negative.  Negative for chest pain and leg swelling.  Gastrointestinal: Negative.  Negative for abdominal  pain.  Genitourinary: Negative.  Negative for dysuria.  Musculoskeletal: Negative.  Negative for back pain.  Skin: Negative.  Negative for rash.  Neurological: Positive for headaches. Negative for dizziness, focal weakness and weakness.  Endo/Heme/Allergies: Does not bruise/bleed easily.  Psychiatric/Behavioral: The patient is nervous/anxious.     As per HPI. Otherwise, a complete review of systems is negative.  PAST MEDICAL HISTORY: Past Medical History:  Diagnosis Date  . Asthma   . Guillain Barr syndrome ([place])   . Hyperlipidemia   . Hypertension   . Hypothyroidism   . 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 Pancreatitis     PAST SURGICAL HISTORY: Past Surgical History:  Procedure Laterality Date  . ABDOMINAL HYSTERECTOMY    . BREAST BIOPSY Right 2012   negative  . CHOLECYSTECTOMY    . 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 OTHER SURGICAL HISTORY     excision of lipoma  . OTHER SURGICAL HISTORY     Abdominal surgery  . THYROID SURGERY     thyroidectomy    FAMILY HISTORY: Family History  Problem Relation Age of Onset  . Breast cancer Sister 70  . Breast cancer Maternal Aunt 100  . Breast cancer Maternal Grandmother 60  . Breast cancer Maternal Aunt 24  . Breast cancer Cousin 40       maternal side    ADVANCED DIRECTIVES (Y/N):  N  HEALTH MAINTENANCE: Social History   Tobacco Use  . Smoking status: Former Smoker    Quit date: 12/28/1997    Years since quitting: 21.6  . Smokeless tobacco: Never Used  Substance Use Topics  . Alcohol use: Not Currently    Alcohol/week: 0.0 standard drinks  .  Drug use: No     Colonoscopy:  PAP:  Bone density:  Lipid panel:  Allergies  Allergen Reactions  . Nsaids Other (See Comments)    Internal bleeding  Intestinal bleeding  . Adhesive [Tape] Rash  . Aspirin Other (See Comments)    Cannot take due to ulcers.  . Penicillins Rash    Has patient had a PCN reaction causing immediate rash,  facial/tongue/throat swelling, SOB or lightheadedness with hypotension: Yes Has patient had a PCN reaction causing severe rash involving mucus membranes or skin necrosis: No Has patient had a PCN reaction that required hospitalization: No Has patient had a PCN reaction occurring within the last 10 years: Yes If all of the above answers are "NO", then may proceed with Cephalosporin use.     Current Outpatient Medications  Medication Sig Dispense Refill  . acetaminophen (TYLENOL) 325 MG tablet Take 2 tablets (650 mg total) by mouth every 6 (six) hours as needed for mild pain (or Fever >/= 101).    Marland Kitchen albuterol (VENTOLIN HFA) 108 (90 Base) MCG/ACT inhaler Inhale 1-2 puffs into the lungs every 6 (six) hours as needed for wheezing or shortness of breath.     . famotidine (PEPCID) 20 MG tablet Take 1 tablet (20 mg total) by mouth daily. 30 tablet 0  . gabapentin (NEURONTIN) 300 MG capsule Take 900-1,200 mg by mouth 4 (four) times daily. 900 mg in the morning, 900 mg in the afternoon, 900 mg in the evening, and 1200 mg at night    . hydrOXYzine (VISTARIL) 50 MG capsule Take 50 mg by mouth every 6 (six) hours as needed for anxiety.     Marland Kitchen levothyroxine (SYNTHROID) 175 MCG tablet Take 175 mcg by mouth daily.    . QUEtiapine (SEROQUEL) 200 MG tablet Take 1 tablet (200 mg total) by mouth at bedtime.    Marland Kitchen tiZANidine (ZANAFLEX) 4 MG tablet Take 4 mg by mouth every 6 (six) hours as needed for muscle spasms.     Marland Kitchen venlafaxine XR (EFFEXOR-XR) 75 MG 24 hr capsule Take 225 mg by mouth daily.      No current facility-administered medications for this visit.    OBJECTIVE: There were no vitals filed for this visit.   There is no height or weight on file to calculate BMI.    ECOG FS:0 - Asymptomatic  General: Well-developed, well-nourished, no acute distress. HEENT: Normocephalic. Neuro: Alert, answering all questions appropriately. Cranial nerves grossly intact. Psych: Normal affect.  LAB RESULTS:  Lab  Results  Component Value Date   NA 140 07/10/2019   K 3.6 07/10/2019   CL 115 (H) 07/10/2019   CO2 16 (L) 07/10/2019   GLUCOSE 198 (H) 07/10/2019   BUN 11 07/10/2019   CREATININE 0.63 07/10/2019   CALCIUM 7.1 (L) 07/10/2019   PROT 5.3 (L) 07/10/2019   ALBUMIN 2.9 (L) 07/10/2019   AST 39 07/10/2019   ALT 58 (H) 07/10/2019   ALKPHOS 83 07/10/2019   BILITOT 0.5 07/10/2019   GFRNONAA >60 07/10/2019   GFRAA >60 07/10/2019    Lab Results  Component Value Date   WBC 14.1 (H) 07/15/2019   NEUTROABS 14.8 (H) 07/10/2019   HGB 14.7 07/15/2019   HCT 43.1 07/15/2019   MCV 80.0 07/15/2019   PLT 419 (H) 07/15/2019     STUDIES: DG Chest 1 View  Result Date: 07/09/2019 CLINICAL DATA:  54 year old female with shortness of breath. EXAM: CHEST  1 VIEW COMPARISON:  Chest radiograph dated 08/28/2018. FINDINGS: There  is diffuse interstitial coarsening. Patchy area of airspace density in the right upper lobe has progressed since the prior radiograph and concerning for infiltrate. Clinical correlation and follow-up to resolution recommended. There is no pleural effusion or pneumothorax. Stable cardiac silhouette. No acute osseous pathology. IMPRESSION: Interval progression of the right upper lobe airspace opacity concerning for pneumonia. Follow-up to resolution recommended. Electronically Signed   By: Anner Crete M.D.   On: 07/09/2019 22:04   MR BRAIN W WO CONTRAST  Result Date: 07/23/2019 CLINICAL DATA:  New daily persistent headache. Additional history provided by scanning technologist: Patient reports headaches 3 to 4 times a week for the past 6 months, history of neural fibromatosis EXAM: MRI HEAD WITHOUT AND WITH CONTRAST TECHNIQUE: Multiplanar, multiecho pulse sequences of the brain and surrounding structures were obtained without and with intravenous contrast. CONTRAST:  65m GADAVIST GADOBUTROL 1 MMOL/ML IV SOLN COMPARISON:  Brain MRI 07/11/2017 FINDINGS: Brain: There is no evidence of acute  infarct. No evidence of intracranial mass. No midline shift or extra-axial fluid collection. No chronic intracranial blood products. Unchanged nonspecific subcentimeter focus of T2/FLAIR hyperintensity within the right corona radiata (series 15, image 34). No other focal parenchymal signal abnormality is identified. No abnormal intracranial enhancement. Cerebral volume is normal for age. Unchanged mild Chiari malformation with the cerebellar tonsils extending 6 mm below the level of the foramen magnum. Vascular: Flow voids maintained within the proximal large arterial vessels. Skull and upper cervical spine: No focal marrow lesion. Sinuses/Orbits: Visualized orbits demonstrate no acute abnormality. No significant paranasal sinus disease or mastoid effusion. Other: Unchanged 8 mm T2/FLAIR hyperintense nonenhancing lesion within the left naso-maxillary soft tissues (series 15, image 8). In retrospect, this finding was present on the prior MRI, although is seen to better advantage on today's study. IMPRESSION: No evidence of acute intracranial abnormality. Stable subcentimeter focus of T2 hyperintensity within the right cerebral white matter, nonspecific and of doubtful clinical significance in isolation. Unchanged mild Chiari I malformation. 8 mm nonenhancing lesion within the left naso-maxillary soft tissues. This has remained stable since MRI of 07/11/2017 and is likely benign. Electronically Signed   By: KKellie SimmeringDO   On: 07/23/2019 10:08    ASSESSMENT: Easy bruising, polycythemia.  PLAN:    1. Easy bruising: Unclear etiology.  Patient has a mild thrombocytosis which is likely reactive.  Otherwise all of her other laboratory work including PT/INR, PTT, von Willebrand panel, and platelet function assay are all either negative or within normal limits.  No intervention is needed at this time.  Patient does not require bone marrow biopsy.   2.  Polycythemia: Resolved.  Patient's most recent hemoglobin was  reported at 14.7.  All of her other laboratory work including iron panel, Hemochromatosis mutation, carbon monoxide level, and erythropoietin are all either negative or within normal limits. 3.  Leukocytosis: Likely residual from recent bout of pneumonia and steroid use. 4.  Pneumonia: Resolved. 5.  Headaches: MRI results from July 26, 2019 reviewed independently and report as above with no obvious etiology of patient's headache.  Patient does have an 8 mm nonenhancing lesion in her left maxillary sinus which is completely unchanged since March 2019 and is likely benign. 5.  Disposition: After lengthy discussion with the patient, it was agreed upon that no further follow-up is necessary.  Please refer patient back if there are any questions or concerns.  I spent a total of 30 minutes reviewing chart data, face-to-face evaluation with the patient, counseling and coordination of  care as detailed above.   Patient expressed understanding and was in agreement with this plan. She also understands that She can call clinic at any time with any questions, concerns, or complaints.    Lloyd Huger, MD   08/05/2019 6:54 AM

## 2019-08-04 ENCOUNTER — Encounter: Payer: Self-pay | Admitting: Oncology

## 2019-08-04 ENCOUNTER — Inpatient Hospital Stay: Payer: BC Managed Care – PPO | Attending: Oncology | Admitting: Oncology

## 2019-08-04 DIAGNOSIS — R238 Other skin changes: Secondary | ICD-10-CM | POA: Diagnosis not present

## 2019-08-04 DIAGNOSIS — R233 Spontaneous ecchymoses: Secondary | ICD-10-CM

## 2019-08-04 DIAGNOSIS — D751 Secondary polycythemia: Secondary | ICD-10-CM

## 2019-08-04 NOTE — Progress Notes (Signed)
Patient states she had MRI ordered by pcp due to frequent headaches. Patient states she has 8 mm lesion in left maxillary sinus and what should she about it. Would like a second opinion.

## 2019-08-25 ENCOUNTER — Telehealth: Payer: BC Managed Care – PPO | Admitting: Oncology

## 2019-08-27 ENCOUNTER — Other Ambulatory Visit: Payer: Self-pay | Admitting: Specialist

## 2019-08-27 DIAGNOSIS — J849 Interstitial pulmonary disease, unspecified: Secondary | ICD-10-CM

## 2019-09-05 ENCOUNTER — Emergency Department
Admission: EM | Admit: 2019-09-05 | Discharge: 2019-09-05 | Disposition: A | Discharge status: Home / Self Care | Payer: BC Managed Care – PPO | Attending: Emergency Medicine | Admitting: Emergency Medicine

## 2019-09-05 ENCOUNTER — Encounter: Payer: Self-pay | Admitting: Emergency Medicine

## 2019-09-05 ENCOUNTER — Other Ambulatory Visit: Payer: Self-pay

## 2019-09-05 ENCOUNTER — Emergency Department: Payer: BC Managed Care – PPO

## 2019-09-05 DIAGNOSIS — Z87891 Personal history of nicotine dependence: Secondary | ICD-10-CM | POA: Diagnosis not present

## 2019-09-05 DIAGNOSIS — Z79899 Other long term (current) drug therapy: Secondary | ICD-10-CM | POA: Insufficient documentation

## 2019-09-05 DIAGNOSIS — J45909 Unspecified asthma, uncomplicated: Secondary | ICD-10-CM | POA: Insufficient documentation

## 2019-09-05 DIAGNOSIS — R109 Unspecified abdominal pain: Secondary | ICD-10-CM | POA: Insufficient documentation

## 2019-09-05 DIAGNOSIS — R531 Weakness: Secondary | ICD-10-CM

## 2019-09-05 DIAGNOSIS — J449 Chronic obstructive pulmonary disease, unspecified: Secondary | ICD-10-CM | POA: Insufficient documentation

## 2019-09-05 DIAGNOSIS — E039 Hypothyroidism, unspecified: Secondary | ICD-10-CM | POA: Insufficient documentation

## 2019-09-05 DIAGNOSIS — R82998 Other abnormal findings in urine: Secondary | ICD-10-CM | POA: Insufficient documentation

## 2019-09-05 DIAGNOSIS — D34 Benign neoplasm of thyroid gland: Secondary | ICD-10-CM | POA: Diagnosis not present

## 2019-09-05 DIAGNOSIS — R3912 Poor urinary stream: Secondary | ICD-10-CM | POA: Insufficient documentation

## 2019-09-05 DIAGNOSIS — I10 Essential (primary) hypertension: Secondary | ICD-10-CM | POA: Diagnosis not present

## 2019-09-05 LAB — CBC
HCT: 43 % (ref 36.0–46.0)
Hemoglobin: 14.7 g/dL (ref 12.0–15.0)
MCH: 27.5 pg (ref 26.0–34.0)
MCHC: 34.2 g/dL (ref 30.0–36.0)
MCV: 80.5 fL (ref 80.0–100.0)
Platelets: 319 10*3/uL (ref 150–400)
RBC: 5.34 MIL/uL — ABNORMAL HIGH (ref 3.87–5.11)
RDW: 13.4 % (ref 11.5–15.5)
WBC: 8.7 10*3/uL (ref 4.0–10.5)
nRBC: 0 % (ref 0.0–0.2)

## 2019-09-05 LAB — BASIC METABOLIC PANEL
Anion gap: 8 (ref 5–15)
BUN: 30 mg/dL — ABNORMAL HIGH (ref 6–20)
CO2: 26 mmol/L (ref 22–32)
Calcium: 9.3 mg/dL (ref 8.9–10.3)
Chloride: 105 mmol/L (ref 98–111)
Creatinine, Ser: 0.98 mg/dL (ref 0.44–1.00)
GFR calc Af Amer: >60 mL/min (ref >60)
GFR calc non Af Amer: >60 mL/min (ref >60)
Glucose, Bld: 114 mg/dL — ABNORMAL HIGH (ref 70–99)
Potassium: 3.5 mmol/L (ref 3.5–5.1)
Sodium: 139 mmol/L (ref 135–145)

## 2019-09-05 LAB — URINALYSIS, COMPLETE (UACMP) WITH MICROSCOPIC
Bacteria, UA: NONE SEEN
Bilirubin Urine: NEGATIVE
Glucose, UA: NEGATIVE mg/dL
Hgb urine dipstick: NEGATIVE
Ketones, ur: NEGATIVE mg/dL
Leukocytes,Ua: NEGATIVE
Nitrite: NEGATIVE
Protein, ur: NEGATIVE mg/dL
Specific Gravity, Urine: >1.046 — ABNORMAL HIGH (ref 1.005–1.030)
pH: 5 (ref 5.0–8.0)

## 2019-09-05 LAB — CK: Total CK: 22 U/L — ABNORMAL LOW (ref 38–234)

## 2019-09-05 LAB — TROPONIN I (HIGH SENSITIVITY): Troponin I (High Sensitivity): 4 ng/L (ref <18)

## 2019-09-05 MED ORDER — IOHEXOL 300 MG/ML  SOLN
100.0000 mL | Freq: Once | INTRAMUSCULAR | Status: AC | PRN
  Administered 2019-09-05: 100 mL via INTRAVENOUS

## 2019-09-05 MED ORDER — ACETAMINOPHEN 500 MG PO TABS
1000.0000 mg | ORAL_TABLET | Freq: Once | ORAL | Status: AC
  Administered 2019-09-05: 1000 mg via ORAL
  Filled 2019-09-05: qty 2

## 2019-09-05 MED ORDER — IOHEXOL 350 MG/ML SOLN: 75.0000 mL | Freq: Once | INTRAVENOUS | Status: DC | PRN

## 2019-09-05 MED ORDER — OXYCODONE HCL 5 MG PO TABS
5.0000 mg | ORAL_TABLET | Freq: Once | ORAL | Status: AC
  Administered 2019-09-05: 5 mg via ORAL
  Filled 2019-09-05: qty 1

## 2019-09-05 MED ORDER — LIDOCAINE 5 % EX PTCH
1.0000 | MEDICATED_PATCH | CUTANEOUS | Status: DC
  Administered 2019-09-05: 1 via TRANSDERMAL
  Filled 2019-09-05: qty 1

## 2019-09-05 MED ORDER — SODIUM CHLORIDE 0.9 % IV BOLUS
1000.0000 mL | Freq: Once | INTRAVENOUS | Status: AC
  Administered 2019-09-05: 1000 mL via INTRAVENOUS

## 2019-09-05 NOTE — ED Notes (Signed)
Electronic keypad malfunction- pt gives verbal consent for discharge.

## 2019-09-05 NOTE — ED Triage Notes (Signed)
Patient to ER for c/o "I feel like my kidneys are failing.". Patient states she had kidneys fail in 2019, had similar feelings. Patient states she feels something is not right and that she has had decreased urine output with dark urine present.

## 2019-09-05 NOTE — Discharge Instructions (Addendum)
There were concerns that you are slightly dehydrated we gave you some fluids that seem to help.  Your CT imaging was negative.  Return to the ER if you develop worsening weakness otherwise you follow with your neurologist.

## 2019-09-05 NOTE — ED Notes (Signed)
Pt ambulated in room. Pt ambulating with one handed assistance, pt reports she does use cane at baseline.  Pt states she feels she could get around in her home and is feeling better. MD updated.

## 2019-09-05 NOTE — ED Notes (Signed)
Pt urinated 150 ml's at this time

## 2019-09-05 NOTE — ED Notes (Signed)
Pt assisted to toilet 

## 2019-09-05 NOTE — ED Provider Notes (Signed)
[facility] Emergency Department Provider Note  ____________________________________________   First MD Initiated Contact with Patient 09/05/19 [id number]     (approximate)  I have reviewed the triage vital signs and the nursing notes.   HISTORY  Chief Complaint Decreased urination    HPI Jenna Tyler is a 54 y.o. female with neurofibromatosis, GBS, hypertension, hyperlipidemia, functional weakness who comes in with concerns for kidney issues.  Patient states that she had her kidneys fail in 2019 and had similar symptoms.  Patient states that she is having decreased urine output with dark urine.  Patient states that her kidney function was so bad that she almost needed dialysis.  When I reviewed patient's H&P patient had mild bump in her creatinine most likely prerenal in nature.  According to patient she has a history of functional weakness in which she has to ambulate with a cane.  Patient reports having MRIs that were negative and we are not exactly sure what is causing it.  He is followed by Dr. Melrose Nakayama from neurology.  She states that over the past 2 or 3 days she is had increasing weakness to the point where today she felt like she could not get out of bed, severe, nothing makes it better, nothing makes it worse.  She has had MRIs of both her back and brain that have been negative in the past.  Patient denies any chest pain, abdominal pain, shortness of breath.  She does have a little bit of left flank tenderness that started recently as well.  Dates that she did do some heavy lifting yesterday so is not sure that could be contributing.          Past Medical History:  Diagnosis Date  . Asthma   . Guillain Barr syndrome ([place])   . Hyperlipidemia   . Hypertension   . Hypothyroidism   . 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 Pancreatitis     Patient Active Problem List   Diagnosis Date Noted  . Adaptation reaction 07/14/2019  . Allergic  rhinitis, seasonal 07/14/2019  . Breast lump 07/14/2019  . Cephalalgia 07/14/2019  . Chronic pain associated with significant psychosocial dysfunction 07/14/2019  . Encounter for screening for lipoid disorders 07/14/2019  . Feeling bilious 07/14/2019  . Feeling stressed out 07/14/2019  . Immunization, tetanus toxoid 07/14/2019  . Infection of the upper respiratory tract 07/14/2019  . Urethra, 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  . Uterine spasm 07/14/2019  . Breath shortness 07/14/2019  . Breathlessness on exertion 07/14/2019  . SOB (shortness of breath)   . Sepsis without septic shock ([place]) 07/10/2019  . Easy bruising 07/02/2019  . Polycythemia 07/02/2019  . Tremor 05/29/2019  . Cognitive decline 09/29/2018  . CAP (community acquired pneumonia) 08/28/2018  . Seizures ([place]) 01/15/2018  . Leg pain 10/18/2017  . Lower extremity weakness 10/18/2017  . Weakness 09/16/2017  . Lower extremity pain, bilateral 09/10/2017  . Other symptoms and signs involving the musculoskeletal system 07/26/2017  . AKI (acute kidney injury) ([place]) 07/12/2017  . HTN (hypertension) 12/29/2016  . COPD exacerbation ([place]) 12/29/2016  . Substance induced mood disorder ([place]) 12/29/2016  . Alcohol use disorder, severe, dependence ([place]) 12/27/2016  . 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 Vomiting of fecal matter 08/15/2016  . Globus sensation 08/15/2016  . Multiple somatic complaints 08/15/2016  . Multiple duodenal ulcers 01/24/2016  . Prediabetes 07/07/2015  . Neurofibromatosis, type 1 ([place]) 06/01/2015  . Acquired hypothyroidism 04/04/2015  .  Pre-diabetes 04/04/2015  . Vitamin D deficiency 04/04/2015  . Borderline diabetes mellitus 04/04/2015  . Anxiety 10/12/2014  . Anxiety, generalized 10/12/2014  . H/O: hypothyroidism 10/12/2014  . Insomnia, persistent 10/12/2014  . Cannot sleep 10/12/2014  . Depression, major, recurrent, moderate ([place]) 10/12/2014  . Drug  abuse, opioid type ([place]) 10/12/2014  . Nondependent opioid abuse in remission ([place]) 10/12/2014  . Generalized anxiety disorder 10/12/2014  . Moderate episode of recurrent major depressive disorder ([place]) 10/12/2014  . Opioid abuse ([place]) 10/12/2014  . History of hypothyroidism 10/12/2014  . 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 Severe protein-calorie malnutrition ([place]) 07/30/2014  . Nausea & vomiting 07/07/2014  . Hypothyroidism, unspecified 07/07/2014  . Depression with anxiety 07/07/2014  . 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/2013  . Lipoma of abdominal wall 09/15/2013  . Pleurisy 08/17/2010  . Vomiting and diarrhea 01/23/2010  . Cough 11/24/2009  . Postmenopausal atrophic vaginitis 11/21/2009  . Lumbar back sprain 10/10/2009  . Clinical depression 07/12/2009  . Arthralgia of multiple joints 06/30/2009  . Benign neoplasm of thyroid gland 05/27/2009  . Fast heart beat 05/10/2009  . Persistent insomnia 02/07/2009  . Mixed disorder as reaction to stress 10/09/2008  . Boil of eyelid 08/01/2007  . Chest pain on breathing 06/03/2007  . LBP (low back pain) 03/17/2007  . Epigastric pain 11/27/2006  . Hypoglycemia 11/06/2005  . Addiction, opium ([place]) 04/23/2002  . Headache, migraine 04/23/1998  . 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 ABDOMINAL HYSTERECTOMY    . BREAST BIOPSY Right 2012   negative  . CHOLECYSTECTOMY    . 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 OTHER SURGICAL HISTORY     excision of lipoma  . OTHER SURGICAL HISTORY     Abdominal surgery  . THYROID SURGERY     thyroidectomy    Prior to Admission medications   Medication Sig Start Date End Date Taking? Authorizing Provider  acetaminophen (TYLENOL) 325 MG tablet Take 2 tablets (650 mg total) by mouth every 6 (six) hours as needed for mild pain (or Fever >/= 101). 08/31/18   Gouru, Illene Silver, MD  albuterol (VENTOLIN HFA) 108 (90 Base)  MCG/ACT inhaler Inhale 1-2 puffs into the lungs every 6 (six) hours as needed for wheezing or shortness of breath.     [provider]  famotidine (PEPCID) 20 MG tablet Take 1 tablet (20 mg total) by mouth daily. 07/12/19   Nolberto Hanlon, MD  gabapentin (NEURONTIN) 300 MG capsule Take 900-1,200 mg by mouth 4 (four) times daily. 900 mg in the morning, 900 mg in the afternoon, 900 mg in the evening, and 1200 mg at night 07/06/19   [provider]  hydrOXYzine (VISTARIL) 50 MG capsule Take 50 mg by mouth every 6 (six) hours as needed for anxiety.     [provider]  levothyroxine (SYNTHROID) 175 MCG tablet Take 175 mcg by mouth daily. 05/11/19   [provider]  QUEtiapine (SEROQUEL) 200 MG tablet Take 1 tablet (200 mg total) by mouth at bedtime. 07/16/17   Hillary Bow, MD  tiZANidine (ZANAFLEX) 4 MG tablet Take 4 mg by mouth every 6 (six) hours as needed for muscle spasms.     [provider]  venlafaxine XR (EFFEXOR-XR) 75 MG 24 hr capsule Take 225 mg by mouth daily.     [provider]    Allergies Nsaids, Adhesive [tape], Aspirin, and Penicillins  Family History  Problem Relation  Age of Onset  . Breast cancer Sister 73  . Breast cancer Maternal Aunt 15  . Breast cancer Maternal Grandmother 60  . Breast cancer Maternal Aunt 15  . Breast cancer Cousin 40       maternal side    Social History Social History   Tobacco Use  . Smoking status: Former Smoker    Quit date: 12/28/1997    Years since quitting: 21.7  . Smokeless tobacco: Never Used  Substance Use Topics  . Alcohol use: Not Currently    Alcohol/week: 0.0 standard drinks  . Drug use: No      Review of Systems Constitutional: No fever/chills, positive weakness Eyes: No visual changes. ENT: No sore throat. Cardiovascular: Denies chest pain. Respiratory: Denies shortness of breath. Gastrointestinal: No abdominal pain.  No nausea, no vomiting.  No diarrhea.  No  constipation. Genitourinary: Negative for dysuria.  Positive decreased urination Musculoskeletal: Positive back pain Skin: Negative for rash. Neurological: Negative for headaches, focal weakness or numbness. All other ROS negative ____________________________________________   PHYSICAL EXAM:  VITAL SIGNS: ED Triage Vitals  Enc Vitals Group     BP 09/05/19 0751 (!) 124/104     Pulse Rate 09/05/19 0751 82     Resp 09/05/19 0751 20     Temp 09/05/19 0751 97.7 F (36.5 C)     Temp Source 09/05/19 0751 Oral     SpO2 09/05/19 0751 96 %     Weight 09/05/19 0755 180 lb (81.6 kg)     Height 09/05/19 0755 5\' 5"  (1.651 m)     Head Circumference --      Peak Flow --      Pain Score 09/05/19 0752 5     Pain Loc --      Pain Edu? --      Excl. in [place]? --     Constitutional: Alert and oriented. Well appearing and in no acute distress. Eyes: Conjunctivae are normal. EOMI. Head: Atraumatic. Nose: No congestion/rhinnorhea. Mouth/Throat: Mucous membranes are moist.   Neck: No stridor. Trachea Midline. FROM Cardiovascular: Normal rate, regular rhythm. Grossly normal heart sounds.  Good peripheral circulation. Respiratory: Normal respiratory effort.  No retractions. Lungs CTAB. Gastrointestinal: Soft and nontender. No distention. No abdominal bruits.  Musculoskeletal: No lower extremity tenderness nor edema.  No joint effusions. Neurologic:  Normal speech and language.  Patient has weak but equal bilateral grip strength.  Patient has bilateral weakness in her legs where she cannot lift them off the bed but she states that this is baseline for her.  Otherwise no other cranial nerve deficits. Skin:  Skin is warm, dry and intact. No rash noted. Psychiatric: Mood and affect are normal. Speech and behavior are normal. GU: Deferred  Back: Left flank tenderness without any rash noted ____________________________________________   LABS (all labs ordered are listed, but only abnormal results are  displayed)  Labs Reviewed  BASIC METABOLIC PANEL - Abnormal; Notable for the following components:      Result Value   Glucose, Bld 114 (*)    BUN 30 (*)    All other components within normal limits  CBC - Abnormal; Notable for the following components:   RBC 5.34 (*)    All other components within normal limits  URINALYSIS, COMPLETE (UACMP) WITH MICROSCOPIC  CK  TROPONIN I (HIGH SENSITIVITY)   ____________________________________________   ED ECG REPORT I, Vanessa Belk, the attending physician, personally viewed and interpreted this ECG.  EKG is sinus rate of 78,  no ST elevation no T wave inversions, QTC is 519 ____________________________________________  RADIOLOGY \  Official radiology report(s): CT ABDOMEN PELVIS W CONTRAST  Result Date: 09/05/2019 CLINICAL DATA:  Abdominal distension and LEFT flank pain. EXAM: CT ABDOMEN AND PELVIS WITH CONTRAST TECHNIQUE: Multidetector CT imaging of the abdomen and pelvis was performed using the standard protocol following bolus administration of intravenous contrast. CONTRAST:  133mL OMNIPAQUE IOHEXOL 300 MG/ML  SOLN COMPARISON:  CT 02/14/2016 FINDINGS: Lower chest: No consolidation or pleural effusion. Basilar atelectasis. Hepatobiliary: Post cholecystectomy. Portal vein is patent. No biliary ductal dilation beyond post cholecystectomy baseline. Pancreas: Pancreas is normal without focal lesion or ductal dilation. No peripancreatic inflammation. Spleen: Spleen normal size without focal lesion. Adrenals/Urinary Tract: Adrenal glands are normal. Kidneys enhance symmetrically. No sign of hydronephrosis nephrolithiasis or ureteral calculus given limitations of contrast imaging. Urinary bladder is normal. Stomach/Bowel: Appendix is normal. No evidence of small bowel dilation or acute small bowel process. Stool fills much of the colon. Vascular/Lymphatic: Vascular structures in the abdomen are patent. No significant atherosclerosis or evidence of  adenopathy. Reproductive: Post hysterectomy. No adnexal mass. Other: No abdominal wall hernia. No ascites. No free air. Musculoskeletal: No acute musculoskeletal process. Spinal degenerative changes. IMPRESSION: 1. No acute intra-abdominal or pelvic pathology. 2. Post cholecystectomy and hysterectomy. 3. Normal appendix. Electronically Signed   By: Zetta Bills M.D.   On: 09/05/2019 11:05    ____________________________________________   PROCEDURES  Procedure(s) performed (including Critical Care):  Procedures   ____________________________________________   INITIAL IMPRESSION / ASSESSMENT AND PLAN / ED COURSE  Jenna Tyler was evaluated in Emergency Department on 09/05/2019 for the symptoms described in the history of present illness. She was evaluated in the context of the global COVID-19 pandemic, which necessitated consideration that the patient might be at risk for infection with the SARS-CoV-2 virus that causes COVID-19. Institutional protocols and algorithms that pertain to the evaluation of patients at risk for COVID-19 are in a state of rapid change based on information released by regulatory bodies including the CDC and federal and state organizations. These policies and algorithms were followed during the patient's care in the ED.    Patient is a 54 year old who comes in with worsening leg weakness and decreased urination.  Get labs to evaluate for electrolyte abnormalities, AKI, UTI, rhabdo.  Will get post void bladder scan to make sure no signs of retention to suggest cord compression.  Patient's had extensive work-up for her leg weakness in the past and has had negative MRIs so seems unlikely to be cord compression but if there is signs of new retention that would be a change in symptom that we would need to reconsider repeating imaging.  We will give patient 1 L of fluid, Tylenol and lidocaine patch for her left flank tenderness.  Bladder scan was normal.  Kidney  function was normal although BUN is slightly elevated.  Discussed with patient given the left flank pain that is new that this could be a kidney stone versus AAA versus other acute pathology and patient is okay with proceeding with CT imaging.  Patient requesting something more for pain.  Will give 1 dose of oxycodone while pending CT imaging.  Patient states that she is already feeling better though from a weakness perspective.  CT imaging was negative.  Repeat evaluation patient states that she is feeling more at her baseline self.  Patient was able to ambulate how she normally does with a cane.  At this time we discussed  MRIs and I think would have low utility given she is had prior MRIs have all been negative and she is appears to be at her baseline self.  Patient is agreeable to holding off on MRIs.  Patient was able to urinate.  No onset UTI.  Patient feels comfortable going home at this time  I discussed the provisional nature of ED diagnosis, the treatment so far, the ongoing plan of care, follow up appointments and return precautions with the patient and any family or support people present. They expressed understanding and agreed with the plan, discharged home.  ____________________________________________   FINAL CLINICAL IMPRESSION(S) / ED DIAGNOSES   Final diagnoses:  Weakness      MEDICATIONS GIVEN DURING THIS VISIT:  Medications  lidocaine (LIDODERM) 5 % 1 patch (1 patch Transdermal Patch Applied 09/05/19 0845)  sodium chloride 0.9 % bolus 1,000 mL (0 mLs Intravenous Stopped 09/05/19 1000)  acetaminophen (TYLENOL) tablet 1,000 mg (1,000 mg Oral Given 09/05/19 0845)  oxyCODONE (Oxy IR/ROXICODONE) immediate release tablet 5 mg (5 mg Oral Given 09/05/19 1021)  iohexol (OMNIPAQUE) 300 MG/ML solution 100 mL (100 mLs Intravenous Contrast Given 09/05/19 1026)     ED Discharge Orders    None       Note:  This document was prepared using Dragon voice recognition software and  may include unintentional dictation errors.   Vanessa [place], MD 09/05/19 1240

## 2019-10-01 ENCOUNTER — Other Ambulatory Visit: Payer: Self-pay | Admitting: Specialist

## 2019-10-01 DIAGNOSIS — R053 Chronic cough: Secondary | ICD-10-CM

## 2019-10-01 DIAGNOSIS — R1312 Dysphagia, oropharyngeal phase: Secondary | ICD-10-CM

## 2019-10-07 ENCOUNTER — Ambulatory Visit
Admission: RE | Admit: 2019-10-07 | Discharge: 2019-10-07 | Disposition: A | Discharge status: Home / Self Care | Payer: BC Managed Care – PPO | Source: Ambulatory Visit | Attending: Specialist | Admitting: Specialist

## 2019-10-07 ENCOUNTER — Other Ambulatory Visit: Payer: Self-pay

## 2019-10-07 DIAGNOSIS — R05 Cough: Secondary | ICD-10-CM | POA: Diagnosis present

## 2019-10-07 DIAGNOSIS — R053 Chronic cough: Secondary | ICD-10-CM

## 2019-10-07 DIAGNOSIS — R1312 Dysphagia, oropharyngeal phase: Secondary | ICD-10-CM | POA: Insufficient documentation

## 2019-10-07 NOTE — Progress Notes (Signed)
Modified Barium Swallow Progress Note  Patient Details  Name: Jenna Tyler MRN: [id number] Date of Birth: [birth year]  Today's Date: 10/07/2019  Modified Barium Swallow completed.  Full report located under Chart Review in the Imaging Section.  Brief recommendations include the following:  Clinical Impression  Pt presents with functional oropharyngeal abilities when consuming puree, solids, whole barium tablet, thin liquids (via cup and straw) and nectar thick liquids. Pt's oral phase is swift with good bolus management and timely anterior-posterior propulsion. Pt's pharyngeal phase is swift with excellent airway protection. Penetration and aspiration were not observed. Of note, there appeared to be stasis of barium in esophagus. While backflow into the pharynx was not visualized, pt reports history of GERD, currently not taking any reflux medicines d/t "hasn't taken time to fill the prescription" and her recurrent pneumonia, I would recommend a barium swallow to fully assess function of esophagus.    Swallow Evaluation Recommendations   Recommended Consults: Consider GI evaluation;Consider esophageal assessment   SLP Diet Recommendations: Regular solids;Thin liquid   Liquid Administration via: Cup;Straw   Medication Administration: Whole meds with liquid   Supervision: Patient able to self feed   Compensations: Minimize environmental distractions;Slow rate;Small sips/bites   Postural Changes: Seated upright at 90 degrees   Oral Care Recommendations: Oral care BID       Jenna Tyler B. Rutherford Nail M.S., CCC-SLP, [place] Office [phone]  Jenna Tyler Rutherford Nail 10/07/2019,3:32 PM

## 2019-11-05 ENCOUNTER — Other Ambulatory Visit: Payer: Self-pay | Admitting: Orthopedic Surgery

## 2019-11-05 DIAGNOSIS — M5412 Radiculopathy, cervical region: Secondary | ICD-10-CM

## 2019-11-23 ENCOUNTER — Other Ambulatory Visit: Payer: Self-pay

## 2019-11-23 ENCOUNTER — Ambulatory Visit
Admission: RE | Admit: 2019-11-23 | Discharge: 2019-11-23 | Disposition: A | Discharge status: Home / Self Care | Payer: BC Managed Care – PPO | Source: Ambulatory Visit | Attending: Orthopedic Surgery | Admitting: Orthopedic Surgery

## 2019-11-23 DIAGNOSIS — M5412 Radiculopathy, cervical region: Secondary | ICD-10-CM | POA: Insufficient documentation

## 2019-11-25 ENCOUNTER — Ambulatory Visit: Payer: Self-pay

## 2019-11-30 ENCOUNTER — Other Ambulatory Visit: Payer: Self-pay | Admitting: Family Medicine

## 2019-11-30 DIAGNOSIS — Z1231 Encounter for screening mammogram for malignant neoplasm of breast: Secondary | ICD-10-CM

## 2019-12-01 ENCOUNTER — Other Ambulatory Visit: Payer: Self-pay | Admitting: Family Medicine

## 2019-12-01 DIAGNOSIS — Z1231 Encounter for screening mammogram for malignant neoplasm of breast: Secondary | ICD-10-CM

## 2019-12-09 DIAGNOSIS — R519 Headache, unspecified: Secondary | ICD-10-CM | POA: Insufficient documentation

## 2019-12-15 ENCOUNTER — Other Ambulatory Visit: Payer: BC Managed Care – PPO

## 2019-12-21 ENCOUNTER — Other Ambulatory Visit: Payer: Self-pay

## 2019-12-21 ENCOUNTER — Ambulatory Visit
Admission: RE | Admit: 2019-12-21 | Discharge: 2019-12-21 | Disposition: A | Discharge status: Home / Self Care | Payer: BC Managed Care – PPO | Source: Ambulatory Visit | Attending: Family Medicine | Admitting: Family Medicine

## 2019-12-21 DIAGNOSIS — Z1231 Encounter for screening mammogram for malignant neoplasm of breast: Secondary | ICD-10-CM | POA: Insufficient documentation

## 2020-01-14 DIAGNOSIS — E039 Hypothyroidism, unspecified: Secondary | ICD-10-CM | POA: Diagnosis not present

## 2020-01-14 DIAGNOSIS — G935 Compression of brain: Secondary | ICD-10-CM | POA: Diagnosis not present

## 2020-01-14 DIAGNOSIS — Z79899 Other long term (current) drug therapy: Secondary | ICD-10-CM | POA: Diagnosis not present

## 2020-01-14 DIAGNOSIS — R519 Headache, unspecified: Secondary | ICD-10-CM | POA: Diagnosis not present

## 2020-01-14 DIAGNOSIS — Q8501 Neurofibromatosis, type 1: Secondary | ICD-10-CM | POA: Diagnosis not present

## 2020-01-14 DIAGNOSIS — R220 Localized swelling, mass and lump, head: Secondary | ICD-10-CM | POA: Diagnosis not present

## 2020-01-18 ENCOUNTER — Ambulatory Visit: Payer: Self-pay

## 2020-01-18 NOTE — Telephone Encounter (Signed)
Pt. Complaining of right arm/bicep pain x 2 months. Has swelling and weakness to hand. Instructed to call her PCP for appointment.  Reason for Disposition . [1] MODERATE pain (e.g., interferes with normal activities) AND [2] present > 3 days  Answer Assessment - Initial Assessment Questions 1. ONSET: "When did the pain start?"     Pain 2. LOCATION: "Where is the pain located?"     Right bicep 3. PAIN: "How bad is the pain?" (Scale 1-10; or mild, moderate, severe)   - MILD (1-3): doesn't interfere with normal activities   - MODERATE (4-7): interferes with normal activities (e.g., work or school) or awakens from sleep   - SEVERE (8-10): excruciating pain, unable to do any normal activities, unable to hold a cup of water     Mild 4. WORK OR EXERCISE: "Has there been any recent work or exercise that involved this part of the body?"     No 5. CAUSE: "What do you think is causing the arm pain?"     Unsure 6. OTHER SYMPTOMS: "Do you have any other symptoms?" (e.g., neck pain, swelling, rash, fever, numbness, weakness)     Swelling 7. PREGNANCY: "Is there any chance you are pregnant?" "When was your last menstrual period?"     No  Protocols used: ARM PAIN-A-AH

## 2020-02-09 DIAGNOSIS — M7989 Other specified soft tissue disorders: Secondary | ICD-10-CM | POA: Diagnosis not present

## 2020-04-07 ENCOUNTER — Other Ambulatory Visit: Payer: Self-pay

## 2020-04-07 ENCOUNTER — Inpatient Hospital Stay
Admission: EM | Admit: 2020-04-07 | Discharge: 2020-04-09 | DRG: 343 | Disposition: A | Discharge status: Home / Self Care | Payer: Medicaid Other | Attending: Surgery | Admitting: Surgery

## 2020-04-07 ENCOUNTER — Emergency Department: Payer: Medicaid Other

## 2020-04-07 DIAGNOSIS — Z803 Family history of malignant neoplasm of breast: Secondary | ICD-10-CM

## 2020-04-07 DIAGNOSIS — K3580 Unspecified acute appendicitis: Principal | ICD-10-CM | POA: Diagnosis present

## 2020-04-07 DIAGNOSIS — Z90710 Acquired absence of both cervix and uterus: Secondary | ICD-10-CM

## 2020-04-07 DIAGNOSIS — Z8719 Personal history of other diseases of the digestive system: Secondary | ICD-10-CM

## 2020-04-07 DIAGNOSIS — E890 Postprocedural hypothyroidism: Secondary | ICD-10-CM | POA: Diagnosis present

## 2020-04-07 DIAGNOSIS — Z880 Allergy status to penicillin: Secondary | ICD-10-CM

## 2020-04-07 DIAGNOSIS — Z79890 Hormone replacement therapy (postmenopausal): Secondary | ICD-10-CM | POA: Diagnosis not present

## 2020-04-07 DIAGNOSIS — K660 Peritoneal adhesions (postprocedural) (postinfection): Secondary | ICD-10-CM | POA: Diagnosis present

## 2020-04-07 DIAGNOSIS — E785 Hyperlipidemia, unspecified: Secondary | ICD-10-CM | POA: Diagnosis present

## 2020-04-07 DIAGNOSIS — K279 Peptic ulcer, site unspecified, unspecified as acute or chronic, without hemorrhage or perforation: Secondary | ICD-10-CM | POA: Diagnosis present

## 2020-04-07 DIAGNOSIS — J449 Chronic obstructive pulmonary disease, unspecified: Secondary | ICD-10-CM | POA: Diagnosis present

## 2020-04-07 DIAGNOSIS — Z20822 Contact with and (suspected) exposure to covid-19: Secondary | ICD-10-CM | POA: Diagnosis present

## 2020-04-07 DIAGNOSIS — Z886 Allergy status to analgesic agent status: Secondary | ICD-10-CM

## 2020-04-07 DIAGNOSIS — K353 Acute appendicitis with localized peritonitis, without perforation or gangrene: Secondary | ICD-10-CM

## 2020-04-07 DIAGNOSIS — Z87891 Personal history of nicotine dependence: Secondary | ICD-10-CM

## 2020-04-07 DIAGNOSIS — R1031 Right lower quadrant pain: Secondary | ICD-10-CM

## 2020-04-07 DIAGNOSIS — I10 Essential (primary) hypertension: Secondary | ICD-10-CM | POA: Diagnosis present

## 2020-04-07 DIAGNOSIS — Z79899 Other long term (current) drug therapy: Secondary | ICD-10-CM

## 2020-04-07 DIAGNOSIS — R109 Unspecified abdominal pain: Secondary | ICD-10-CM | POA: Diagnosis present

## 2020-04-07 LAB — URINALYSIS, COMPLETE (UACMP) WITH MICROSCOPIC
Bacteria, UA: NONE SEEN
Glucose, UA: NEGATIVE mg/dL
Hgb urine dipstick: NEGATIVE
Ketones, ur: NEGATIVE mg/dL
Nitrite: NEGATIVE
Protein, ur: 30 mg/dL — AB
Specific Gravity, Urine: 1.039 — ABNORMAL HIGH (ref 1.005–1.030)
pH: 5 (ref 5.0–8.0)

## 2020-04-07 LAB — COMPREHENSIVE METABOLIC PANEL
ALT: 17 U/L (ref 0–44)
AST: 17 U/L (ref 15–41)
Albumin: 4 g/dL (ref 3.5–5.0)
Alkaline Phosphatase: 120 U/L (ref 38–126)
Anion gap: 10 (ref 5–15)
BUN: 27 mg/dL — ABNORMAL HIGH (ref 6–20)
CO2: 24 mmol/L (ref 22–32)
Calcium: 9.2 mg/dL (ref 8.9–10.3)
Chloride: 100 mmol/L (ref 98–111)
Creatinine, Ser: 1 mg/dL (ref 0.44–1.00)
GFR, Estimated: >60 mL/min (ref >60)
Glucose, Bld: 125 mg/dL — ABNORMAL HIGH (ref 70–99)
Potassium: 4.3 mmol/L (ref 3.5–5.1)
Sodium: 134 mmol/L — ABNORMAL LOW (ref 135–145)
Total Bilirubin: 0.6 mg/dL (ref 0.3–1.2)
Total Protein: 7.3 g/dL (ref 6.5–8.1)

## 2020-04-07 LAB — CBC
HCT: 48.5 % — ABNORMAL HIGH (ref 36.0–46.0)
Hemoglobin: 16.4 g/dL — ABNORMAL HIGH (ref 12.0–15.0)
MCH: 27.2 pg (ref 26.0–34.0)
MCHC: 33.8 g/dL (ref 30.0–36.0)
MCV: 80.6 fL (ref 80.0–100.0)
Platelets: 321 10*3/uL (ref 150–400)
RBC: 6.02 MIL/uL — ABNORMAL HIGH (ref 3.87–5.11)
RDW: 13.6 % (ref 11.5–15.5)
WBC: 10.7 10*3/uL — ABNORMAL HIGH (ref 4.0–10.5)
nRBC: 0 % (ref 0.0–0.2)

## 2020-04-07 LAB — RESP PANEL BY RT-PCR (FLU A&B, COVID) ARPGX2
Influenza A by PCR: NEGATIVE
Influenza B by PCR: NEGATIVE
SARS Coronavirus 2 by RT PCR: NEGATIVE

## 2020-04-07 LAB — LIPASE, BLOOD: Lipase: 23 U/L (ref 11–51)

## 2020-04-07 MED ORDER — TIZANIDINE HCL 4 MG PO TABS
4.0000 mg | ORAL_TABLET | Freq: Four times a day (QID) | ORAL | Status: DC | PRN
  Filled 2020-04-07: qty 1

## 2020-04-07 MED ORDER — ACETAMINOPHEN 325 MG PO TABS: 650.0000 mg | ORAL_TABLET | Freq: Four times a day (QID) | ORAL | Status: DC | PRN

## 2020-04-07 MED ORDER — FAMOTIDINE 20 MG PO TABS
20.0000 mg | ORAL_TABLET | Freq: Every day | ORAL | Status: DC
  Administered 2020-04-08 – 2020-04-09 (×2): 20 mg via ORAL
  Filled 2020-04-07 (×2): qty 1

## 2020-04-07 MED ORDER — GABAPENTIN 300 MG PO CAPS: 900.0000 mg | ORAL_CAPSULE | Freq: Four times a day (QID) | ORAL | Status: DC

## 2020-04-07 MED ORDER — HYDROMORPHONE HCL 1 MG/ML IJ SOLN
0.5000 mg | INTRAMUSCULAR | Status: DC | PRN
  Administered 2020-04-07 – 2020-04-09 (×7): 0.5 mg via INTRAVENOUS
  Filled 2020-04-07 (×7): qty 0.5

## 2020-04-07 MED ORDER — LEVOTHYROXINE SODIUM 50 MCG PO TABS
175.0000 ug | ORAL_TABLET | Freq: Every day | ORAL | Status: DC
  Administered 2020-04-08 – 2020-04-09 (×2): 175 ug via ORAL
  Filled 2020-04-07 (×2): qty 1

## 2020-04-07 MED ORDER — AMITRIPTYLINE HCL 10 MG PO TABS
40.0000 mg | ORAL_TABLET | Freq: Every day | ORAL | Status: DC
  Administered 2020-04-08 (×2): 40 mg via ORAL
  Filled 2020-04-07 (×4): qty 4

## 2020-04-07 MED ORDER — IOHEXOL 300 MG/ML  SOLN
100.0000 mL | Freq: Once | INTRAMUSCULAR | Status: AC | PRN
  Administered 2020-04-07: 100 mL via INTRAVENOUS
  Filled 2020-04-07: qty 100

## 2020-04-07 MED ORDER — SODIUM CHLORIDE 0.9 % IV BOLUS
500.0000 mL | Freq: Once | INTRAVENOUS | Status: AC
  Administered 2020-04-07: 500 mL via INTRAVENOUS

## 2020-04-07 MED ORDER — GABAPENTIN 300 MG PO CAPS
900.0000 mg | ORAL_CAPSULE | Freq: Every day | ORAL | Status: DC
  Administered 2020-04-09: 900 mg via ORAL
  Filled 2020-04-07: qty 3

## 2020-04-07 MED ORDER — QUETIAPINE FUMARATE 100 MG PO TABS: 200.0000 mg | ORAL_TABLET | Freq: Every day | ORAL | Status: DC

## 2020-04-07 MED ORDER — HYDROCODONE-ACETAMINOPHEN 5-325 MG PO TABS
1.0000 | ORAL_TABLET | ORAL | Status: DC | PRN
  Administered 2020-04-07 – 2020-04-08 (×2): 2 via ORAL
  Administered 2020-04-08: 1 via ORAL
  Administered 2020-04-08 – 2020-04-09 (×3): 2 via ORAL
  Filled 2020-04-07 (×5): qty 2

## 2020-04-07 MED ORDER — GABAPENTIN 300 MG PO CAPS
900.0000 mg | ORAL_CAPSULE | Freq: Every day | ORAL | Status: DC
  Administered 2020-04-08 – 2020-04-09 (×2): 900 mg via ORAL
  Filled 2020-04-07 (×2): qty 3

## 2020-04-07 MED ORDER — ROPINIROLE HCL 1 MG PO TABS
2.0000 mg | ORAL_TABLET | Freq: Every day | ORAL | Status: DC
  Administered 2020-04-08 (×2): 2 mg via ORAL
  Filled 2020-04-07 (×2): qty 2

## 2020-04-07 MED ORDER — ONDANSETRON HCL 4 MG/2ML IJ SOLN
4.0000 mg | Freq: Four times a day (QID) | INTRAMUSCULAR | Status: DC | PRN
  Administered 2020-04-07 – 2020-04-08 (×2): 4 mg via INTRAVENOUS
  Filled 2020-04-07 (×2): qty 2

## 2020-04-07 MED ORDER — TRAMADOL HCL 50 MG PO TABS: 50.0000 mg | ORAL_TABLET | Freq: Four times a day (QID) | ORAL | Status: DC | PRN

## 2020-04-07 MED ORDER — GABAPENTIN 300 MG PO CAPS
900.0000 mg | ORAL_CAPSULE | Freq: Every day | ORAL | Status: DC
  Administered 2020-04-08 (×2): 900 mg via ORAL
  Filled 2020-04-07 (×2): qty 3

## 2020-04-07 MED ORDER — MORPHINE SULFATE (PF) 4 MG/ML IV SOLN
4.0000 mg | INTRAVENOUS | Status: DC | PRN
  Administered 2020-04-07 (×3): 4 mg via INTRAVENOUS
  Filled 2020-04-07 (×4): qty 1

## 2020-04-07 MED ORDER — ONDANSETRON 4 MG PO TBDP: 4.0000 mg | ORAL_TABLET | Freq: Four times a day (QID) | ORAL | Status: DC | PRN

## 2020-04-07 MED ORDER — MORPHINE SULFATE (PF) 2 MG/ML IV SOLN: 2.0000 mg | INTRAVENOUS | Status: DC | PRN

## 2020-04-07 MED ORDER — ONDANSETRON HCL 4 MG/2ML IJ SOLN
4.0000 mg | Freq: Once | INTRAMUSCULAR | Status: AC
  Administered 2020-04-07: 4 mg via INTRAVENOUS
  Filled 2020-04-07: qty 2

## 2020-04-07 MED ORDER — VENLAFAXINE HCL ER 75 MG PO CP24
225.0000 mg | ORAL_CAPSULE | Freq: Every day | ORAL | Status: DC
  Administered 2020-04-08 – 2020-04-09 (×2): 225 mg via ORAL
  Filled 2020-04-07 (×2): qty 3

## 2020-04-07 MED ORDER — DOCUSATE SODIUM 100 MG PO CAPS
100.0000 mg | ORAL_CAPSULE | Freq: Two times a day (BID) | ORAL | Status: DC | PRN
  Administered 2020-04-09: 100 mg via ORAL
  Filled 2020-04-07: qty 1

## 2020-04-07 MED ORDER — SODIUM CHLORIDE 0.9 % IV SOLN: INTRAVENOUS | Status: DC

## 2020-04-07 MED ORDER — ROPINIROLE HCL 1 MG PO TABS
1.0000 mg | ORAL_TABLET | Freq: Every morning | ORAL | Status: DC
  Administered 2020-04-08 – 2020-04-09 (×2): 1 mg via ORAL
  Filled 2020-04-07 (×2): qty 1

## 2020-04-07 MED ORDER — ALBUTEROL SULFATE HFA 108 (90 BASE) MCG/ACT IN AERS
1.0000 | INHALATION_SPRAY | Freq: Four times a day (QID) | RESPIRATORY_TRACT | Status: DC | PRN
  Filled 2020-04-07: qty 6.7

## 2020-04-07 NOTE — H&P (Signed)
Subjective:   CC: Right lower quadrant pain  HPI:  Jenna Tyler is a 54 y.o. female who is consulted by Quentin Cornwall for evaluation of  above cc.  Symptoms were first noted 1 day ago. Pain is sharp, sudden onset, started last night, periumbilical, then migrated to right lower quadrant.  Associated with nausea, vomiting, exacerbated by nothing specific.  Pain is better controlled now after medication administration.  Last bowel movement was yesterday.  No history of constipation.     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  Family History: family history includes Breast cancer (age of onset: 41) in her cousin, maternal aunt, and maternal aunt; Breast cancer (age of onset: 30) in her sister; Breast cancer (age of onset: 48) in her maternal grandmother.  Social History:  reports that she quit smoking about 22 years ago. She has never used smokeless tobacco. She reports previous alcohol use. She reports that she does not use drugs.  Current Medications:  Prior to Admission medications   Medication Sig Start Date End Date Taking? Authorizing Provider  acetaminophen (TYLENOL) 325 MG tablet Take 2 tablets (650 mg total) by mouth every 6 (six) hours as needed for mild pain (or Fever >/= 101). 08/31/18   Gouru, Illene Silver, MD  albuterol (VENTOLIN HFA) 108 (90 Base) MCG/ACT inhaler Inhale 1-2 puffs into the lungs every 6 (six) hours as needed for wheezing or shortness of breath.     [provider]  famotidine (PEPCID) 20 MG tablet Take 1 tablet (20 mg total) by mouth daily. 07/12/19   Nolberto Hanlon, MD  gabapentin (NEURONTIN) 300 MG capsule Take 900-1,200 mg by mouth 4 (four) times daily. 900 mg in the morning, 900 mg in the afternoon, 900 mg in the evening, and 1200 mg at night 07/06/19   [provider]  hydrOXYzine (VISTARIL) 50 MG capsule Take 50 mg by mouth every 6 (six) hours as needed for anxiety.     [provider]  levothyroxine (SYNTHROID) 175 MCG tablet Take 175 mcg by mouth daily. 05/11/19   [provider]  QUEtiapine (SEROQUEL) 200 MG tablet Take 1 tablet (200 mg total) by mouth at bedtime. 07/16/17   Hillary Bow, MD  tiZANidine (ZANAFLEX) 4 MG tablet Take 4 mg by mouth every 6 (six) hours as needed for muscle spasms.     [provider]  venlafaxine XR (EFFEXOR-XR) 75 MG 24 hr capsule Take 225 mg by mouth daily.     [provider]    Allergies:  Allergies as of 04/07/2020 - Review Complete 04/07/2020  Allergen Reaction Noted  . Nsaids Other (See Comments) 10/28/2014  . Adhesive [tape] Rash 02/14/2016  . Aspirin Other (See Comments) 10/28/2014  . Penicillins Rash 02/14/2016    ROS:  General: Denies weight loss, weight gain, fatigue, fevers, chills, and night sweats. Eyes: Denies blurry vision, double vision, eye pain, itchy eyes, and tearing. Ears: Denies hearing loss, earache, and ringing in ears. Nose: Denies sinus pain, congestion, infections, runny nose, and nosebleeds. Mouth/throat: Denies hoarseness, sore throat, bleeding gums, and difficulty swallowing. Heart: Denies chest pain, palpitations, racing heart, irregular heartbeat, leg pain or swelling, and decreased activity tolerance. Respiratory: Denies breathing difficulty, shortness  of breath, wheezing, cough, and sputum. GI: Denies change in appetite, heartburn, constipation, diarrhea, and blood in stool. GU: Denies difficulty urinating, pain with urinating, urgency, frequency, blood in urine. Musculoskeletal: Denies joint stiffness, pain, swelling, muscle  weakness. Skin: Denies rash, itching, mass, tumors, sores, and boils Neurologic: Denies headache, fainting, dizziness, seizures, numbness, and tingling. Psychiatric: Denies depression, anxiety, difficulty sleeping, and memory loss. Endocrine: Denies heat or cold intolerance, and increased thirst or urination. Blood/lymph: Denies easy bruising, easy bruising, and swollen glands     Objective:     BP (!) 143/90   Pulse 81   Temp (!) 97.5 F (36.4 C) (Oral)   Resp 20   Ht 5\' 5"  (1.651 m)   Wt 83.9 kg   SpO2 96%   BMI 30.79 kg/m    Constitutional :  alert, cooperative, appears stated age and no distress  Lymphatics/Throat:  no asymmetry, masses, or scars  Respiratory:  clear to auscultation bilaterally  Cardiovascular:  regular rate and rhythm  Gastrointestinal: Soft, no guarding, focal tenderness to palpation in right lower quadrant..   Musculoskeletal: Steady gait and movement  Skin: Cool and moist  Psychiatric: Normal affect, non-agitated, not confused       LABS:  CMP Latest Ref Rng & Units 04/07/2020 09/05/2019 07/10/2019  Glucose 70 - 99 mg/dL 125(H) 114(H) 198(H)  BUN 6 - 20 mg/dL 27(H) 30(H) 11  Creatinine 0.44 - 1.00 mg/dL 1.00 0.98 0.63  Sodium 135 - 145 mmol/L 134(L) 139 140  Potassium 3.5 - 5.1 mmol/L 4.3 3.5 3.6  Chloride 98 - 111 mmol/L 100 105 115(H)  CO2 22 - 32 mmol/L 24 26 16(L)  Calcium 8.9 - 10.3 mg/dL 9.2 9.3 7.1(L)  Total Protein 6.5 - 8.1 g/dL 7.3 - 5.3(L)  Total Bilirubin 0.3 - 1.2 mg/dL 0.6 - 0.5  Alkaline Phos 38 - 126 U/L 120 - 83  AST 15 - 41 U/L 17 - 39  ALT 0 - 44 U/L 17 - 58(H)   CBC Latest Ref Rng & Units 04/07/2020 09/05/2019 07/15/2019  WBC 4.0 - 10.5 K/uL 10.7(H) 8.7 14.1(H)  Hemoglobin 12.0 - 15.0 g/dL 16.4(H) 14.7 14.7  Hematocrit 36.0 - 46.0 % 48.5(H) 43.0 43.1  Platelets 150 - 400 K/uL 321 319 419(H)     RADS: CLINICAL DATA:  Right lower quadrant pain  EXAM: CT ABDOMEN AND PELVIS WITH CONTRAST  TECHNIQUE: Multidetector  CT imaging of the abdomen and pelvis was performed using the standard protocol following bolus administration of intravenous contrast.  CONTRAST:  148mL OMNIPAQUE IOHEXOL 300 MG/ML  SOLN  COMPARISON:  09/05/2019  FINDINGS: Lower chest: Calcified granuloma is noted in the left lower lobe. Mild pectus excavatum is seen.  Hepatobiliary: Gallbladder has been surgically removed. Mild biliary ductal dilatation is noted. The liver is otherwise within normal limits.  Pancreas: Unremarkable. No pancreatic ductal dilatation or surrounding inflammatory changes.  Spleen: Normal in size without focal abnormality.  Adrenals/Urinary Tract: Adrenal glands are within normal limits. Kidneys demonstrate a normal enhancement pattern bilaterally. No renal calculi or obstructive changes are seen. Bladder is decompressed.  Stomach/Bowel: The appendix is within normal limits. No obstructive or inflammatory changes are noted. Small bowel and stomach are within normal limits.  Vascular/Lymphatic: No significant vascular findings are present. No enlarged abdominal or pelvic lymph nodes.  Reproductive: Status post hysterectomy. No adnexal masses.  Other: No abdominal wall hernia or abnormality. No abdominopelvic ascites.  Musculoskeletal: Degenerative changes of lumbar spine are noted.  IMPRESSION: Postsurgical changes.  Normal-appearing appendix.  Changes of prior granulomatous disease.  No acute abnormality noted.   Electronically Signed   By: Inez Catalina M.D.   On: 04/07/2020 13:23 Assessment:      Right lower quadrant pain, history consistent with appendicitis, but CT scan reported with normal plan.  Images reviewed by myself and agree with report of very normal appendix, with no signs of appendicolith, surrounding inflammation, wall thickening.  There is obvious intraluminal air as well.  Plan:     Duration of symptoms, degree of pain, and leukocytosis, all  indicate appendicitis and at this point should see some changes on CT scan, but nothing noted.  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  Discussed the options of proceeding with diagnostic laparoscopy, or observation to see if the pain resolves and leukocytosis resolves overnight.  Patient requested observation for now due to the increased risk of proceeding with diagnostic laparoscopy with a negative CT scan.  We will admit her for serial abdominal exams, IV fluids, pain control.  I did emphasize there still be a chance we may need to proceed with a diagnostic laparoscopy if the pain does not improve overnight.  She verbalized understanding.

## 2020-04-07 NOTE — ED Provider Notes (Signed)
[facility] Emergency Department Provider Note    Event Date/Time   First MD Initiated Contact with Patient 04/07/20 1236     (approximate)  I have reviewed the triage vital signs and the nursing notes.   HISTORY  Chief Complaint Abdominal Pain    HPI Jenna Tyler is a 54 y.o. female the below listed past medical history presents to the ER for evaluation of 24 hours of what initially started as periumbilical pain aching throbbing in nature with gradual migration to the right lower quadrant over the past several hours was associated with nausea vomiting dry heaving.  Has had some chills but no measured temperature at home.  Is never had pain like this before.  No previous abdominal surgeries.    Past Medical History:  Diagnosis Date  . Asthma   . Guillain Barr syndrome ([place])   . Hyperlipidemia   . Hypertension   . Hypothyroidism   . 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 Pancreatitis    Family History  Problem Relation Age of Onset  . Breast cancer Sister 16  . Breast cancer Maternal Aunt 77  . Breast cancer Maternal Grandmother 60  . Breast cancer Maternal Aunt 80  . Breast cancer Cousin 40       maternal side   Past Surgical History:  Procedure Laterality Date  . ABDOMINAL HYSTERECTOMY    . BREAST BIOPSY Right 2012   negative  . CHOLECYSTECTOMY    . 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 OTHER SURGICAL HISTORY     excision of lipoma  . OTHER SURGICAL HISTORY     Abdominal surgery  . THYROID SURGERY     thyroidectomy   Patient Active Problem List   Diagnosis Date Noted  . Abdominal pain 04/07/2020  . Adaptation reaction 07/14/2019  . Allergic rhinitis, seasonal 07/14/2019  . Breast lump 07/14/2019  . Cephalalgia 07/14/2019  . Chronic pain associated with significant psychosocial dysfunction 07/14/2019  . Encounter for screening for lipoid disorders 07/14/2019  . Feeling bilious 07/14/2019  . Feeling stressed  out 07/14/2019  . Immunization, tetanus toxoid 07/14/2019  . Infection of the upper respiratory tract 07/14/2019  . Urethra, 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  . Uterine spasm 07/14/2019  . Breath shortness 07/14/2019  . Breathlessness on exertion 07/14/2019  . SOB (shortness of breath)   . Sepsis without septic shock ([place]) 07/10/2019  . Easy bruising 07/02/2019  . Polycythemia 07/02/2019  . Tremor 05/29/2019  . Cognitive decline 09/29/2018  . CAP (community acquired pneumonia) 08/28/2018  . Seizures ([place]) 01/15/2018  . Leg pain 10/18/2017  . Lower extremity weakness 10/18/2017  . Weakness 09/16/2017  . Lower extremity pain, bilateral 09/10/2017  . Other symptoms and signs involving the musculoskeletal system 07/26/2017  . AKI (acute kidney injury) ([place]) 07/12/2017  . HTN (hypertension) 12/29/2016  . COPD exacerbation ([place]) 12/29/2016  . Substance induced mood disorder ([place]) 12/29/2016  . Alcohol use disorder, severe, dependence ([place]) 12/27/2016  . 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 Vomiting of fecal matter 08/15/2016  . Globus sensation 08/15/2016  . Multiple somatic complaints 08/15/2016  . Multiple duodenal ulcers 01/24/2016  . Prediabetes 07/07/2015  . Neurofibromatosis, type 1 ([place]) 06/01/2015  . Acquired hypothyroidism 04/04/2015  . Pre-diabetes 04/04/2015  . Vitamin D deficiency 04/04/2015  . Borderline diabetes mellitus 04/04/2015  . Anxiety 10/12/2014  . Anxiety, generalized 10/12/2014  . H/O: hypothyroidism 10/12/2014  . Insomnia, persistent 10/12/2014  . Cannot  sleep 10/12/2014  . Depression, major, recurrent, moderate ([place]) 10/12/2014  . Drug abuse, opioid type ([place]) 10/12/2014  . Nondependent opioid abuse in remission ([place]) 10/12/2014  . Generalized anxiety disorder 10/12/2014  . Moderate episode of recurrent major depressive disorder ([place]) 10/12/2014  . Opioid abuse ([place]) 10/12/2014  . History of hypothyroidism  10/12/2014  . 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 Severe protein-calorie malnutrition ([place]) 07/30/2014  . Nausea & vomiting 07/07/2014  . Hypothyroidism, unspecified 07/07/2014  . Depression with anxiety 07/07/2014  . 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/2013  . Lipoma of abdominal wall 09/15/2013  . Pleurisy 08/17/2010  . Vomiting and diarrhea 01/23/2010  . Cough 11/24/2009  . Postmenopausal atrophic vaginitis 11/21/2009  . Lumbar back sprain 10/10/2009  . Clinical depression 07/12/2009  . Arthralgia of multiple joints 06/30/2009  . Benign neoplasm of thyroid gland 05/27/2009  . Fast heart beat 05/10/2009  . Persistent insomnia 02/07/2009  . Mixed disorder as reaction to stress 10/09/2008  . Boil of eyelid 08/01/2007  . Chest pain on breathing 06/03/2007  . LBP (low back pain) 03/17/2007  . Epigastric pain 11/27/2006  . Hypoglycemia 11/06/2005  . Addiction, opium ([place]) 04/23/2002  . Headache, migraine 04/23/1998  . 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 Authorizing Provider  acetaminophen (TYLENOL) 325 MG tablet Take 2 tablets (650 mg total) by mouth every 6 (six) hours as needed for mild pain (or Fever >/= 101). 08/31/18   Gouru, Illene Silver, MD  albuterol (VENTOLIN HFA) 108 (90 Base) MCG/ACT inhaler Inhale 1-2 puffs into the lungs every 6 (six) hours as needed for wheezing or shortness of breath.     [provider]  famotidine (PEPCID) 20 MG tablet Take 1 tablet (20 mg total) by mouth daily. 07/12/19   Nolberto Hanlon, MD  gabapentin (NEURONTIN) 300 MG capsule Take 900-1,200 mg by mouth 4 (four) times daily. 900 mg in the morning, 900 mg in the afternoon, 900 mg in the evening, and 1200 mg at night 07/06/19   [provider]  hydrOXYzine (VISTARIL) 50 MG capsule Take 50 mg by mouth every 6 (six) hours as needed for anxiety.     [provider]   levothyroxine (SYNTHROID) 175 MCG tablet Take 175 mcg by mouth daily. 05/11/19   [provider]  QUEtiapine (SEROQUEL) 200 MG tablet Take 1 tablet (200 mg total) by mouth at bedtime. 07/16/17   Hillary Bow, MD  tiZANidine (ZANAFLEX) 4 MG tablet Take 4 mg by mouth every 6 (six) hours as needed for muscle spasms.     [provider]  venlafaxine XR (EFFEXOR-XR) 75 MG 24 hr capsule Take 225 mg by mouth daily.     [provider]    Allergies Nsaids, Adhesive [tape], Aspirin, and Penicillins    Social History Social History   Tobacco Use  . Smoking status: Former Smoker    Quit date: 12/28/1997    Years since quitting: 22.2  . Smokeless tobacco: Never Used  Vaping Use  . Vaping Use: Never used  Substance Use Topics  . Alcohol use: Not Currently    Alcohol/week: 0.0 standard drinks  . Drug use: No    Review of Systems Patient denies headaches, rhinorrhea, blurry vision, numbness, shortness of breath, chest pain, edema, cough, abdominal pain, nausea, vomiting, diarrhea, dysuria, fevers, rashes or hallucinations unless otherwise stated above in HPI. ____________________________________________   PHYSICAL EXAM:  VITAL SIGNS: Vitals:   04/07/20 1300 04/07/20 1415  BP: (!) 137/98 (!) 143/90  Pulse: 98 81  Resp: 20 20  Temp:    SpO2: 94% 96%    Constitutional: Alert and oriented.  Eyes: Conjunctivae are normal.  Head: Atraumatic. Nose: No congestion/rhinnorhea. Mouth/Throat: Mucous membranes are moist.   Neck: No stridor. Painless ROM.  Cardiovascular: Normal rate, regular rhythm. Grossly normal heart sounds.  Good peripheral circulation. Respiratory: Normal respiratory effort.  No retractions. Lungs CTAB. Gastrointestinal: Soft with ttp in RLQ.  + rovsigs sign. No distention. No abdominal bruits. No CVA tenderness. Genitourinary:  Musculoskeletal: No lower extremity tenderness nor edema.  No joint effusions. Neurologic:  Normal speech and  language. No gross focal neurologic deficits are appreciated. No facial droop Skin:  Skin is warm, dry and intact. No rash noted. Psychiatric: Mood and affect are normal. Speech and behavior are normal.  ____________________________________________   LABS (all labs ordered are listed, but only abnormal results are displayed)  Results for orders placed or performed during the hospital encounter of 04/07/20 (from the past 24 hour(s))  Urinalysis, Complete w Microscopic     Status: Abnormal   Collection Time: 04/07/20  8:59 AM  Result Value Ref Range   Color, Urine YELLOW (A) YELLOW   APPearance HAZY (A) CLEAR   Specific Gravity, Urine 1.039 (H) 1.005 - 1.030   pH 5.0 5.0 - 8.0   Glucose, UA NEGATIVE NEGATIVE mg/dL   Hgb urine dipstick NEGATIVE NEGATIVE   Bilirubin Urine SMALL (A) NEGATIVE   Ketones, ur NEGATIVE NEGATIVE mg/dL   Protein, ur 30 (A) NEGATIVE mg/dL   Nitrite NEGATIVE NEGATIVE   Leukocytes,Ua SMALL (A) NEGATIVE   RBC / HPF 0-5 0 - 5 RBC/hpf   WBC, UA 0-5 0 - 5 WBC/hpf   Bacteria, UA NONE SEEN NONE SEEN   Squamous Epithelial / LPF 0-5 0 - 5   Mucus PRESENT   Lipase, blood     Status: None   Collection Time: 04/07/20  9:02 AM  Result Value Ref Range   Lipase 23 11 - 51 U/L  Comprehensive metabolic panel     Status: Abnormal   Collection Time: 04/07/20  9:02 AM  Result Value Ref Range   Sodium 134 (L) 135 - 145 mmol/L   Potassium 4.3 3.5 - 5.1 mmol/L   Chloride 100 98 - 111 mmol/L   CO2 24 22 - 32 mmol/L   Glucose, Bld 125 (H) 70 - 99 mg/dL   BUN 27 (H) 6 - 20 mg/dL   Creatinine, Ser 1.00 0.44 - 1.00 mg/dL   Calcium 9.2 8.9 - 10.3 mg/dL   Total Protein 7.3 6.5 - 8.1 g/dL   Albumin 4.0 3.5 - 5.0 g/dL   AST 17 15 - 41 U/L   ALT 17 0 - 44 U/L   Alkaline Phosphatase 120 38 - 126 U/L   Total Bilirubin 0.6 0.3 - 1.2 mg/dL   GFR, Estimated >60 >60 mL/min   Anion gap 10 5 - 15  CBC     Status: Abnormal   Collection Time: 04/07/20  9:02 AM  Result Value Ref Range    WBC 10.7 (H) 4.0 - 10.5 K/uL   RBC 6.02 (H) 3.87 - 5.11 MIL/uL   Hemoglobin 16.4 (H) 12.0 - 15.0 g/dL   HCT 48.5 (H) 36.0 - 46.0 %   MCV 80.6 80.0 - 100.0 fL   MCH 27.2 26.0 - 34.0 pg   MCHC 33.8 30.0 - 36.0  g/dL   RDW 13.6 11.5 - 15.5 %   Platelets 321 150 - 400 K/uL   nRBC 0.0 0.0 - 0.2 %  Resp Panel by RT-PCR (Flu A&B, Covid) Nasopharyngeal Swab     Status: None   Collection Time: 04/07/20 12:55 PM   Specimen: Nasopharyngeal Swab; Nasopharyngeal(NP) swabs in vial transport medium  Result Value Ref Range   SARS Coronavirus 2 by RT PCR NEGATIVE NEGATIVE   Influenza A by PCR NEGATIVE NEGATIVE   Influenza B by PCR NEGATIVE NEGATIVE   ____________________________________________ ____________________________________________  RADIOLOGY  I personally reviewed all radiographic images ordered to evaluate for the above acute complaints and reviewed radiology reports and findings.  These findings were personally discussed with the patient.  Please see medical record for radiology report.  ____________________________________________   PROCEDURES  Procedure(s) performed:  Procedures    Critical Care performed: no ____________________________________________   INITIAL IMPRESSION / ASSESSMENT AND PLAN / ED COURSE  Pertinent labs & imaging results that were available during my care of the patient were reviewed by me and considered in my medical decision making (see chart for details).   DDX: Appendicitis, kidney stone, pyelonephritis, colitis, SBO, hernia, aneurysm  Jenna Tyler is a 54 y.o. who presents to the ED with presentation as described above suspect acute appendicitis based on her exam and history.  Will order IV fluids IV narcotic medication IV antiemetic and CT imaging for the above differential.  Clinical Course as of 04/07/20 1547  Thu Apr 07, 2020  1455 Given the patient's presenting symptoms I did consult with Dr. Lysle Pearl of general surgery who kindly  agrees to evaluate patient at bedside.  She remains hemodynamically stable appearing but is still having right-sided pain without other explanation. [PR]    Clinical Course User Index [PR] Merlyn Lot, MD    The patient was evaluated in Emergency Department today for the symptoms described in the history of present illness. He/she was evaluated in the context of the global COVID-19 pandemic, which necessitated consideration that the patient might be at risk for infection with the SARS-CoV-2 virus that causes COVID-19. Institutional protocols and algorithms that pertain to the evaluation of patients at risk for COVID-19 are in a state of rapid change based on information released by regulatory bodies including the CDC and federal and state organizations. These policies and algorithms were followed during the patient's care in the ED.  As part of my medical decision making, I reviewed the following data within the [place] notes reviewed and incorporated, Labs reviewed, notes from prior ED visits and [place] Controlled Substance Database   ____________________________________________   FINAL CLINICAL IMPRESSION(S) / ED DIAGNOSES  Final diagnoses:  Right lower quadrant abdominal pain      NEW MEDICATIONS STARTED DURING THIS VISIT:  New Prescriptions   No medications on file     Note:  This document was prepared using Dragon voice recognition software and may include unintentional dictation errors.    Merlyn Lot, MD 04/07/20 1547

## 2020-04-07 NOTE — ED Notes (Signed)
Pt ambulatory to bathroom independently

## 2020-04-07 NOTE — ED Triage Notes (Signed)
Pt c/o umbilical pain that started last night with N/V, states this morning the pain has moved to her RLQ. Denies having diarrhea.Marland Kitchen

## 2020-04-07 NOTE — ED Notes (Signed)
See triage note, pt reports RLQ pain that started last night with dry heaving. Pt appears uncomfortable in treatment room, denies taking anything for pain. To treatment room in w/c

## 2020-04-08 ENCOUNTER — Inpatient Hospital Stay: Payer: Medicaid Other | Admitting: Registered Nurse

## 2020-04-08 ENCOUNTER — Encounter: Payer: Self-pay | Admitting: Surgery

## 2020-04-08 ENCOUNTER — Encounter
Admission: EM | Disposition: A | Discharge status: Home / Self Care | Payer: Self-pay | Source: Home / Self Care | Attending: Surgery

## 2020-04-08 HISTORY — PX: XI ROBOTIC LAPAROSCOPIC ASSISTED APPENDECTOMY: SHX6877

## 2020-04-08 LAB — CBC
HCT: 46.2 % — ABNORMAL HIGH (ref 36.0–46.0)
Hemoglobin: 15.4 g/dL — ABNORMAL HIGH (ref 12.0–15.0)
MCH: 27.4 pg (ref 26.0–34.0)
MCHC: 33.3 g/dL (ref 30.0–36.0)
MCV: 82.1 fL (ref 80.0–100.0)
Platelets: 280 10*3/uL (ref 150–400)
RBC: 5.63 MIL/uL — ABNORMAL HIGH (ref 3.87–5.11)
RDW: 14.1 % (ref 11.5–15.5)
WBC: 9.2 10*3/uL (ref 4.0–10.5)
nRBC: 0 % (ref 0.0–0.2)

## 2020-04-08 LAB — BASIC METABOLIC PANEL
Anion gap: 7 (ref 5–15)
BUN: 25 mg/dL — ABNORMAL HIGH (ref 6–20)
CO2: 24 mmol/L (ref 22–32)
Calcium: 8.7 mg/dL — ABNORMAL LOW (ref 8.9–10.3)
Chloride: 107 mmol/L (ref 98–111)
Creatinine, Ser: 0.89 mg/dL (ref 0.44–1.00)
GFR, Estimated: >60 mL/min (ref >60)
Glucose, Bld: 123 mg/dL — ABNORMAL HIGH (ref 70–99)
Potassium: 4.8 mmol/L (ref 3.5–5.1)
Sodium: 138 mmol/L (ref 135–145)

## 2020-04-08 LAB — MAGNESIUM: Magnesium: 2.3 mg/dL (ref 1.7–2.4)

## 2020-04-08 LAB — PHOSPHORUS: Phosphorus: 4.4 mg/dL (ref 2.5–4.6)

## 2020-04-08 SURGERY — APPENDECTOMY, ROBOT-ASSISTED, LAPAROSCOPIC
Anesthesia: General

## 2020-04-08 MED ORDER — MIDAZOLAM HCL 2 MG/2ML IJ SOLN
INTRAMUSCULAR | Status: AC
  Filled 2020-04-08: qty 2

## 2020-04-08 MED ORDER — FENTANYL CITRATE (PF) 100 MCG/2ML IJ SOLN
25.0000 ug | INTRAMUSCULAR | Status: DC | PRN
Start: 2020-04-08 — End: 2020-04-08

## 2020-04-08 MED ORDER — BUPIVACAINE HCL (PF) 0.5 % IJ SOLN
INTRAMUSCULAR | Status: AC
  Filled 2020-04-08: qty 30

## 2020-04-08 MED ORDER — SUGAMMADEX SODIUM 200 MG/2ML IV SOLN
INTRAVENOUS | Status: DC | PRN
  Administered 2020-04-08: 200 mg via INTRAVENOUS

## 2020-04-08 MED ORDER — FLUMAZENIL 0.5 MG/5ML IV SOLN: 0.2000 mg | Freq: Once | INTRAVENOUS | Status: AC

## 2020-04-08 MED ORDER — LIDOCAINE-EPINEPHRINE (PF) 1 %-1:200000 IJ SOLN
INTRAMUSCULAR | Status: AC
  Filled 2020-04-08: qty 30

## 2020-04-08 MED ORDER — ONDANSETRON HCL 4 MG/2ML IJ SOLN
INTRAMUSCULAR | Status: AC
  Filled 2020-04-08: qty 2

## 2020-04-08 MED ORDER — ONDANSETRON HCL 4 MG/2ML IJ SOLN
INTRAMUSCULAR | Status: AC
  Administered 2020-04-08: 4 mg via INTRAVENOUS
  Filled 2020-04-08: qty 2

## 2020-04-08 MED ORDER — MIDAZOLAM HCL 2 MG/2ML IJ SOLN
INTRAMUSCULAR | Status: DC | PRN
  Administered 2020-04-08: 2 mg via INTRAVENOUS

## 2020-04-08 MED ORDER — FENTANYL CITRATE (PF) 100 MCG/2ML IJ SOLN
INTRAMUSCULAR | Status: DC | PRN
  Administered 2020-04-08: 12.5 ug via INTRAVENOUS
  Administered 2020-04-08: 25 ug via INTRAVENOUS
  Administered 2020-04-08: 12.5 ug via INTRAVENOUS
  Administered 2020-04-08: 50 ug via INTRAVENOUS

## 2020-04-08 MED ORDER — LIDOCAINE HCL (PF) 2 % IJ SOLN
INTRAMUSCULAR | Status: AC
  Filled 2020-04-08: qty 5

## 2020-04-08 MED ORDER — ARTIFICIAL TEARS OPHTHALMIC OINT
TOPICAL_OINTMENT | OPHTHALMIC | Status: AC
  Filled 2020-04-08: qty 3.5

## 2020-04-08 MED ORDER — ONDANSETRON HCL 4 MG/2ML IJ SOLN: 4.0000 mg | Freq: Once | INTRAMUSCULAR | Status: AC | PRN

## 2020-04-08 MED ORDER — ROCURONIUM BROMIDE 100 MG/10ML IV SOLN
INTRAVENOUS | Status: DC | PRN
  Administered 2020-04-08: 50 mg via INTRAVENOUS
  Administered 2020-04-08: 10 mg via INTRAVENOUS

## 2020-04-08 MED ORDER — DEXAMETHASONE SODIUM PHOSPHATE 10 MG/ML IJ SOLN
INTRAMUSCULAR | Status: AC
  Filled 2020-04-08: qty 1

## 2020-04-08 MED ORDER — PROPOFOL 10 MG/ML IV BOLUS
INTRAVENOUS | Status: AC
  Filled 2020-04-08: qty 20

## 2020-04-08 MED ORDER — DEXAMETHASONE SODIUM PHOSPHATE 10 MG/ML IJ SOLN
INTRAMUSCULAR | Status: DC | PRN
  Administered 2020-04-08: 10 mg via INTRAVENOUS

## 2020-04-08 MED ORDER — ONDANSETRON HCL 4 MG/2ML IJ SOLN
INTRAMUSCULAR | Status: DC | PRN
  Administered 2020-04-08: 4 mg via INTRAVENOUS

## 2020-04-08 MED ORDER — PHENYLEPHRINE HCL (PRESSORS) 10 MG/ML IV SOLN
INTRAVENOUS | Status: DC | PRN
  Administered 2020-04-08 (×2): 100 ug via INTRAVENOUS

## 2020-04-08 MED ORDER — LACTATED RINGERS IV SOLN: INTRAVENOUS | Status: DC | PRN

## 2020-04-08 MED ORDER — BUPIVACAINE HCL (PF) 0.5 % IJ SOLN
INTRAMUSCULAR | Status: DC | PRN
  Administered 2020-04-08: 7 mL

## 2020-04-08 MED ORDER — CEFAZOLIN SODIUM 1 G IJ SOLR
INTRAMUSCULAR | Status: AC
  Filled 2020-04-08: qty 20

## 2020-04-08 MED ORDER — HYDROCODONE-ACETAMINOPHEN 5-325 MG PO TABS
ORAL_TABLET | ORAL | Status: AC
  Filled 2020-04-08: qty 1

## 2020-04-08 MED ORDER — LIDOCAINE-EPINEPHRINE (PF) 1 %-1:200000 IJ SOLN
INTRAMUSCULAR | Status: DC | PRN
  Administered 2020-04-08: 7 mL

## 2020-04-08 MED ORDER — LIDOCAINE HCL (CARDIAC) PF 100 MG/5ML IV SOSY
PREFILLED_SYRINGE | INTRAVENOUS | Status: DC | PRN
  Administered 2020-04-08: 100 mg via INTRAVENOUS

## 2020-04-08 MED ORDER — CEFAZOLIN SODIUM-DEXTROSE 2-3 GM-%(50ML) IV SOLR
INTRAVENOUS | Status: DC | PRN
  Administered 2020-04-08: 2 g via INTRAVENOUS

## 2020-04-08 MED ORDER — PROPOFOL 10 MG/ML IV BOLUS
INTRAVENOUS | Status: DC | PRN
  Administered 2020-04-08: 110 mg via INTRAVENOUS

## 2020-04-08 MED ORDER — FLUMAZENIL 0.5 MG/5ML IV SOLN
INTRAVENOUS | Status: AC
  Administered 2020-04-08: 0.2 mg via INTRAVENOUS
  Filled 2020-04-08: qty 5

## 2020-04-08 MED ORDER — IPRATROPIUM-ALBUTEROL 0.5-2.5 (3) MG/3ML IN SOLN: 3.0000 mL | Freq: Once | RESPIRATORY_TRACT | Status: AC

## 2020-04-08 MED ORDER — FENTANYL CITRATE (PF) 100 MCG/2ML IJ SOLN
INTRAMUSCULAR | Status: AC
  Filled 2020-04-08: qty 2

## 2020-04-08 MED ORDER — IPRATROPIUM-ALBUTEROL 0.5-2.5 (3) MG/3ML IN SOLN: 3.0000 mL | RESPIRATORY_TRACT | Status: DC

## 2020-04-08 MED ORDER — IPRATROPIUM-ALBUTEROL 0.5-2.5 (3) MG/3ML IN SOLN
RESPIRATORY_TRACT | Status: AC
  Administered 2020-04-08: 3 mL via RESPIRATORY_TRACT
  Filled 2020-04-08: qty 3

## 2020-04-08 SURGICAL SUPPLY — 62 items
ANCHOR TIS RET SYS 235ML (MISCELLANEOUS) ×2 IMPLANT
BAG INFUSER PRESSURE 100CC (MISCELLANEOUS) IMPLANT
BLADE SURG SZ11 CARB STEEL (BLADE) ×2 IMPLANT
CANNULA REDUC XI 12-8 STAPL (CANNULA) ×2
CANNULA REDUCER 12-8 DVNC XI (CANNULA) ×1 IMPLANT
CHLORAPREP W/TINT 26 (MISCELLANEOUS) ×2 IMPLANT
COVER TIP SHEARS 8 DVNC (MISCELLANEOUS) ×1 IMPLANT
COVER TIP SHEARS 8MM DA VINCI (MISCELLANEOUS) ×2
COVER WAND RF STERILE (DRAPES) ×2 IMPLANT
DEFOGGER SCOPE WARMER CLEARIFY (MISCELLANEOUS) ×2 IMPLANT
DERMABOND ADVANCED (GAUZE/BANDAGES/DRESSINGS) ×1
DERMABOND ADVANCED .7 DNX12 (GAUZE/BANDAGES/DRESSINGS) ×1 IMPLANT
DRAPE ARM DVNC X/XI (DISPOSABLE) ×3 IMPLANT
DRAPE COLUMN DVNC XI (DISPOSABLE) ×1 IMPLANT
DRAPE DA VINCI XI ARM (DISPOSABLE) ×6
DRAPE DA VINCI XI COLUMN (DISPOSABLE) ×2
ELECT CAUTERY BLADE 6.4 (BLADE) IMPLANT
ELECT REM PT RETURN 9FT ADLT (ELECTROSURGICAL) ×2
ELECTRODE REM PT RTRN 9FT ADLT (ELECTROSURGICAL) ×1 IMPLANT
GLOVE BIOGEL PI IND STRL 7.0 (GLOVE) ×2 IMPLANT
GLOVE BIOGEL PI INDICATOR 7.0 (GLOVE) ×2
GLOVE SURG SYN 6.5 ES PF (GLOVE) ×4 IMPLANT
GOWN STRL REUS W/ TWL LRG LVL3 (GOWN DISPOSABLE) ×3 IMPLANT
GOWN STRL REUS W/TWL LRG LVL3 (GOWN DISPOSABLE) ×6
GRASPER SUT TROCAR 14GX15 (MISCELLANEOUS) IMPLANT
IRRIGATOR SUCT 8 DISP DVNC XI (IRRIGATION / IRRIGATOR) IMPLANT
IRRIGATOR SUCTION 8MM XI DISP (IRRIGATION / IRRIGATOR)
IV NS 1000ML (IV SOLUTION)
IV NS 1000ML BAXH (IV SOLUTION) IMPLANT
KIT TURNOVER KIT A (KITS) ×2 IMPLANT
LABEL OR SOLS (LABEL) IMPLANT
MANIFOLD NEPTUNE II (INSTRUMENTS) IMPLANT
NEEDLE HYPO 22GX1.5 SAFETY (NEEDLE) ×2 IMPLANT
NEEDLE INSUFFLATION 14GA 120MM (NEEDLE) ×2 IMPLANT
OBTURATOR OPTICAL STANDARD 8MM (TROCAR) ×2
OBTURATOR OPTICAL STND 8 DVNC (TROCAR) ×1
OBTURATOR OPTICALSTD 8 DVNC (TROCAR) ×1 IMPLANT
PACK LAP CHOLECYSTECTOMY (MISCELLANEOUS) ×2 IMPLANT
PENCIL ELECTRO HAND CTR (MISCELLANEOUS) ×2 IMPLANT
RELOAD STAPLER 2.5X45 WHT DVNC (STAPLE) ×1 IMPLANT
RELOAD STAPLER 3.5X45 BLU DVNC (STAPLE) ×1 IMPLANT
SEAL CANN UNIV 5-8 DVNC XI (MISCELLANEOUS) ×3 IMPLANT
SEAL XI 5MM-8MM UNIVERSAL (MISCELLANEOUS) ×6
SET TUBE SMOKE EVAC HIGH FLOW (TUBING) ×2 IMPLANT
SOLUTION ELECTROLUBE (MISCELLANEOUS) ×2 IMPLANT
STAPLER 45 DA VINCI SURE FORM (STAPLE) ×2
STAPLER 45 SUREFORM DVNC (STAPLE) ×1 IMPLANT
STAPLER CANNULA SEAL DVNC XI (STAPLE) ×1 IMPLANT
STAPLER CANNULA SEAL XI (STAPLE) ×2
STAPLER RELOAD 2.5X45 WHITE (STAPLE) ×2
STAPLER RELOAD 2.5X45 WHT DVNC (STAPLE) ×1
STAPLER RELOAD 3.5X45 BLU DVNC (STAPLE) ×1
STAPLER RELOAD 3.5X45 BLUE (STAPLE) ×2
SUT MNCRL 4-0 (SUTURE) ×2
SUT MNCRL 4-0 27XMFL (SUTURE) ×1
SUT MNCRL AB 4-0 PS2 18 (SUTURE) ×2 IMPLANT
SUT VIC AB 3-0 SH 27 (SUTURE)
SUT VIC AB 3-0 SH 27X BRD (SUTURE) IMPLANT
SUT VICRYL 0 AB UR-6 (SUTURE) ×2 IMPLANT
SUTURE MNCRL 4-0 27XMF (SUTURE) ×1 IMPLANT
SYR 30ML LL (SYRINGE) ×2 IMPLANT
SYSTEM WECK SHIELD CLOSURE (TROCAR) IMPLANT

## 2020-04-08 NOTE — Progress Notes (Signed)
Subjective:  CC: Jenna Tyler is a 54 y.o. female  Hospital stay day 1,   RLQ pain  HPI: No acute issues overnight.  Pain still same. Dilaudid only helps for couple hours or so. Still no emesis.  Not hungry.  ROS:  General: Denies weight loss, weight gain, fatigue, fevers, chills, and night sweats. Heart: Denies chest pain, palpitations, racing heart, irregular heartbeat, leg pain or swelling, and decreased activity tolerance. Respiratory: Denies breathing difficulty, shortness of breath, wheezing, cough, and sputum. GI: Denies change in appetite, heartburn, nausea, vomiting, constipation, diarrhea, and blood in stool. GU: Denies difficulty urinating, pain with urinating, urgency, frequency, blood in urine.   Objective:   Temp:  [97.5 F (36.4 C)-98.2 F (36.8 C)] 98 F (36.7 C) (12/17 0600) Pulse Rate:  [80-104] 80 (12/17 0600) Resp:  [17-20] 18 (12/17 0600) BP: (122-148)/(76-100) 127/82 (12/17 0600) SpO2:  [94 %-99 %] 96 % (12/17 0600) Weight:  [83.9 kg] 83.9 kg (12/16 0900)     Height: 5\' 5"  (165.1 cm) Weight: 83.9 kg BMI (Calculated): 30.79   Intake/Output this shift:   Intake/Output Summary (Last 24 hours) at 04/08/2020 0853 Last data filed at 04/08/2020 0500 Gross per 24 hour  Intake 827.39 ml  Output --  Net 827.39 ml    Constitutional :  alert, cooperative, appears stated age and no distress  Respiratory:  clear to auscultation bilaterally  Cardiovascular:  regular rate and rhythm  Gastrointestinal: soft, no guarding, persistent focal RLQ tenderness over mcBurney's point, unchanged from previous exam.   Skin: Cool and moist.   Psychiatric: Normal affect, non-agitated, not confused       LABS:  CMP Latest Ref Rng & Units 04/08/2020 04/07/2020 09/05/2019  Glucose 70 - 99 mg/dL 123(H) 125(H) 114(H)  BUN 6 - 20 mg/dL 25(H) 27(H) 30(H)  Creatinine 0.44 - 1.00 mg/dL 0.89 1.00 0.98  Sodium 135 - 145 mmol/L 138 134(L) 139  Potassium 3.5 - 5.1 mmol/L 4.8 4.3  3.5  Chloride 98 - 111 mmol/L 107 100 105  CO2 22 - 32 mmol/L 24 24 26   Calcium 8.9 - 10.3 mg/dL 8.7(L) 9.2 9.3  Total Protein 6.5 - 8.1 g/dL - 7.3 -  Total Bilirubin 0.3 - 1.2 mg/dL - 0.6 -  Alkaline Phos 38 - 126 U/L - 120 -  AST 15 - 41 U/L - 17 -  ALT 0 - 44 U/L - 17 -   CBC Latest Ref Rng & Units 04/08/2020 04/07/2020 09/05/2019  WBC 4.0 - 10.5 K/uL 9.2 10.7(H) 8.7  Hemoglobin 12.0 - 15.0 g/dL 15.4(H) 16.4(H) 14.7  Hematocrit 36.0 - 46.0 % 46.2(H) 48.5(H) 43.0  Platelets 150 - 400 K/uL 280 321 319    RADS: n/a Assessment:   RLQ pain, etiology still unknown.  Wbc now normal, but no change in clinical exam.  Hx of total hysterectomy, no sign of hernia, skin rash, soft tissue mass.  Pt states again, she is anxious of continuing pain control only.  She understands likely low yield of diagnostic laparoscopy, appendectomy, and increased risk due to previous surgeries.  Will proceed with surgery to possibly determine cause of pain.  Discussed the risk of surgery including post-op infxn, seroma, hematoma, abscess formation, chronic pain, poor-delayed wound healing, possible bowel resection, possible ostomy, possible conversion to open procedure, post-op SBO or ileus, and need for additional procedures to address said risks.  The risks of general anesthetic including MI, CVA, sudden death or even reaction to anesthetic medications also discussed. Alternatives  include continued observation, or antibiotic treatment.  Benefits include possible symptom relief,   Typical post operative recovery of 3-5 days rest, also discussed.  The patient understands the risks, any and all questions were answered to the patient's satisfaction.

## 2020-04-08 NOTE — Op Note (Signed)
Preoperative diagnosis: right lower quadrant pain Postoperative diagnosis: acute appendicitis  Procedure: Robotic assisted laparoscopic appendectomy.  Anesthesia: GETA  Surgeon: Benjamine Sprague  Wound Classification: clean contaminated  Specimen: Appendix  Complications: None  Estimated Blood Loss: 3 mL   Indications: Patient is a 54 y.o. female  presented with above.  Please see H&P for further details.    FIndings: 1.  Irritated appendix with thin adhesions to the surrounding mesentery and abdominal wall 2. No peri-appendiceal abscess or phlegmon 3. Normal anatomy 4. Appendiceal artery ligated and divided with stapler 5. Adequate hemostasis.   Description of procedure: The patient was placed on the operating table in the supine position, left arm tucked. General anesthesia was induced. A time-out was completed verifying correct patient, procedure, site, positioning, and implant(s) and/or special equipment prior to beginning this procedure. The abdomen was prepped and draped in the usual sterile fashion.   Palmer's point located and Veress needle was inserted.  After confirming 2 clicks and a positive saline drop test, gas insufflation was initiated until the abdominal pressure was measured at 15 mmHg.  Afterwards, the Veress needle was removed and a 5 mm port was placed through the same site.  Some adhesions of the omentum was noted to the midline incision but otherwise there was room to place the robotic ports.  8 mm port was placed through a periumbilical site using Optiview technique after incision with an 11 blade.  After local was infused, 2 additional incision was made 8 cm apart each side along the left side of the abdominal wall from the initial incision.  An 8 mm port was caudaed and 51mm port cephalad from initial incision, both under direct visualization.  No injuries from trocar placements were noted. The table was placed in the Trendelenburg position with the right side  elevated.  Xi robotic platform was then brought to the operative field and docked.  Omental attachments from the midline were partially taken down to make room for the right-handed instrument.  The surrounding small bowel in the right lower quadrant was then run from the ileocecal valve to the midway of the jejunum, no additional pathology such as mass, inflammatory bowel, occult infection was noted.  Once the right lower quadrant was cleared of the small bowel, a irritated appendix with very thin adhesions to the surrounding abdominal wall and small bowel mesentery was identified.  Surrounding adhesions were taken down until the base of the appendix was noted at the cecum. window created at base of appendix in the mesentery.  A blue load linear cutting stapler was then used to divide and staple the base of the appendix. It was reloaded with a vascular cartridge and the mesoappendix similarly divided.  No bleeding from the staple lines noted.  The appendix was placed in an endoscopic retrieval bag and removed.  No injury to the surrounding area including the manipulated bowel was noted.  The appendiceal stump and mesoappendix staple line examined again and hemostasis noted. No other pathology was identified within pelvis. The 12 mm trocar removed and port site closed with PMI using 0 vicryl under direct vision. Remaining trocars were removed under direct vision. No bleeding was noted.The abdomen was allowed to collapse.  All skin incisions then closed with subcuticular sutures Monocryl 4-0.  Wounds then dressed with dermabond.  The patient tolerated the procedure well, awakened from anesthesia and was taken to the postanesthesia care unit in satisfactory condition.  Sponge count and instrument count correct at the end of  the procedure.

## 2020-04-08 NOTE — Anesthesia Procedure Notes (Signed)
Procedure Name: Intubation Date/Time: 04/08/2020 1:21 PM Performed by: Hedda Slade, CRNA Pre-anesthesia Checklist: Patient identified, Patient being monitored, Timeout performed, Emergency Drugs available and Suction available Patient Re-evaluated:Patient Re-evaluated prior to induction Oxygen Delivery Method: Circle system utilized Preoxygenation: Pre-oxygenation with 100% oxygen Induction Type: IV induction Ventilation: Mask ventilation without difficulty and Oral airway inserted - appropriate to patient size Laryngoscope Size: 3 and McGraph Grade View: Grade I Tube type: Oral Tube size: 7.0 mm Number of attempts: 1 Airway Equipment and Method: Stylet and Video-laryngoscopy Placement Confirmation: ETT inserted through vocal cords under direct vision,  positive ETCO2 and breath sounds checked- equal and bilateral Secured at: 21 cm Tube secured with: Tape Dental Injury: Teeth and Oropharynx as per pre-operative assessment

## 2020-04-08 NOTE — Transfer of Care (Signed)
Immediate Anesthesia Transfer of Care Note  Patient: Jenna Tyler  Procedure(s) Performed: XI ROBOTIC LAPAROSCOPIC ASSISTED APPENDECTOMY (N/A )  Patient Location: PACU  Anesthesia Type:General  Level of Consciousness: sedated  Airway & Oxygen Therapy: Patient Spontanous Breathing and Patient connected to face mask oxygen  Post-op Assessment: Report given to RN and Post -op Vital signs reviewed and stable  Post vital signs: Reviewed and stable  Last Vitals:  Vitals Value Taken Time  BP 120/67 04/08/20 1456  Temp 36.6 C 04/08/20 1456  Pulse 85 04/08/20 1502  Resp 15 04/08/20 1502  SpO2 90 % 04/08/20 1502  Vitals shown include unvalidated device data.  Last Pain:  Vitals:   04/08/20 1456  TempSrc:   PainSc: Asleep      Patients Stated Pain Goal: 2 (91/79/15 0569)  Complications: No complications documented.

## 2020-04-08 NOTE — Plan of Care (Signed)
  Problem: Pain Managment: Goal: General experience of comfort will improve 04/08/2020 1229 by Cristela Blue, RN Outcome: Progressing 04/08/2020 1229 by Cristela Blue, RN Outcome: Progressing

## 2020-04-08 NOTE — Progress Notes (Signed)
Mobility Specialist - Progress Note   04/08/20 1137  Mobility  Activity Ambulated in room  Level of Assistance Modified independent, requires aide device or extra time  Assistive Device Other (Comment) (pushed IV pole)  Distance Ambulated (ft) 30 ft  Mobility Response Tolerated well  $Mobility charge 1 Mobility    Pt laying in bed upon arrival. Pt agreed to session. Pt mod. Independent t/o session. Pt ambulated in room. Pt had slow but steady gait. Required extra time d/t having (5/10) abdominal pain. No LOB noted. Overall, pt tolerated session well. Pt left laying in bed w/ all needs placed in reach.     Jenna Tyler Mobility Specialist  04/08/20, 11:41 AM

## 2020-04-08 NOTE — Progress Notes (Signed)
Pt O2 sats 89% on 10 liters via simple mask. Dr. Kayleen Memos notified. Acknowledged. Orders received.

## 2020-04-08 NOTE — Anesthesia Preprocedure Evaluation (Signed)
Anesthesia Evaluation  Patient identified by MRN, date of birth, ID band Patient awake    Reviewed: Allergy & Precautions, NPO status , Patient's Chart, lab work & pertinent test results  Airway Mallampati: III  TM Distance: <3 FB     Dental  (+) Chipped, Caps   Pulmonary asthma , COPD, former smoker,    Pulmonary exam normal        Cardiovascular hypertension, Normal cardiovascular exam     Neuro/Psych  Headaches, Seizures -,  PSYCHIATRIC DISORDERS Anxiety Depression  Neuromuscular disease    GI/Hepatic PUD,   Endo/Other  Hypothyroidism   Renal/GU Renal disease  negative genitourinary   Musculoskeletal negative musculoskeletal ROS (+)   Abdominal Normal abdominal exam  (+)   Peds negative pediatric ROS (+)  Hematology negative hematology ROS (+)   Anesthesia Other Findings Past Medical History: No date: Asthma No date: Guillain Barr syndrome ([place]) No date: Hyperlipidemia No date: Hypertension No date: Hypothyroidism 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, peripheral, 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[place]) No date: Pancreatitis  Reproductive/Obstetrics                             Anesthesia Physical Anesthesia Plan  ASA: III  Anesthesia Plan: General   Post-op Pain Management:    Induction: Intravenous  PONV Risk Score and Plan:   Airway Management Planned: Oral ETT  Additional Equipment:   Intra-op Plan:   Post-operative Plan: Extubation in OR  Informed Consent: I have reviewed the patients History and Physical, chart, labs and discussed the procedure including the risks, benefits and alternatives for the proposed anesthesia with the patient or authorized representative who has indicated his/her understanding and acceptance.     Dental advisory given  Plan Discussed with: CRNA and Surgeon  Anesthesia Plan Comments:         Anesthesia Quick Evaluation

## 2020-04-09 LAB — CBC
HCT: 39.1 % (ref 36.0–46.0)
Hemoglobin: 13.1 g/dL (ref 12.0–15.0)
MCH: 27.5 pg (ref 26.0–34.0)
MCHC: 33.5 g/dL (ref 30.0–36.0)
MCV: 82.1 fL (ref 80.0–100.0)
Platelets: 252 10*3/uL (ref 150–400)
RBC: 4.76 MIL/uL (ref 3.87–5.11)
RDW: 14.1 % (ref 11.5–15.5)
WBC: 7.8 10*3/uL (ref 4.0–10.5)
nRBC: 0 % (ref 0.0–0.2)

## 2020-04-09 LAB — BASIC METABOLIC PANEL
Anion gap: 6 (ref 5–15)
BUN: 22 mg/dL — ABNORMAL HIGH (ref 6–20)
CO2: 23 mmol/L (ref 22–32)
Calcium: 8.2 mg/dL — ABNORMAL LOW (ref 8.9–10.3)
Chloride: 107 mmol/L (ref 98–111)
Creatinine, Ser: 0.8 mg/dL (ref 0.44–1.00)
GFR, Estimated: >60 mL/min (ref >60)
Glucose, Bld: 123 mg/dL — ABNORMAL HIGH (ref 70–99)
Potassium: 4.2 mmol/L (ref 3.5–5.1)
Sodium: 136 mmol/L (ref 135–145)

## 2020-04-09 LAB — PHOSPHORUS: Phosphorus: 3 mg/dL (ref 2.5–4.6)

## 2020-04-09 LAB — MAGNESIUM: Magnesium: 2 mg/dL (ref 1.7–2.4)

## 2020-04-09 MED ORDER — ACETAMINOPHEN 325 MG PO TABS: 650.0000 mg | ORAL_TABLET | Freq: Three times a day (TID) | ORAL | 0 refills | Status: AC | PRN

## 2020-04-09 MED ORDER — DOCUSATE SODIUM 100 MG PO CAPS: 100.0000 mg | ORAL_CAPSULE | Freq: Two times a day (BID) | ORAL | 0 refills | Status: AC | PRN

## 2020-04-09 MED ORDER — OXYCODONE-ACETAMINOPHEN 5-325 MG PO TABS
1.0000 | ORAL_TABLET | Freq: Four times a day (QID) | ORAL | Status: DC | PRN
  Administered 2020-04-09: 1 via ORAL
  Filled 2020-04-09: qty 1

## 2020-04-09 MED ORDER — OXYCODONE-ACETAMINOPHEN 5-325 MG PO TABS: 1.0000 | ORAL_TABLET | Freq: Four times a day (QID) | ORAL | 0 refills | Status: DC | PRN

## 2020-04-09 NOTE — Discharge Summary (Signed)
Physician Discharge Summary  Patient ID: Jenna Tyler MRN: [id number] DOB/AGE: 1965-10-31 54 y.o.  Admit date: 04/07/2020 Discharge date: 04/09/20  Admission Diagnoses: acute appendicitis  Discharge Diagnoses:  Same as above  Discharged Condition: good  Hospital Course: admitted for above.  obs overnight due to not clear etiology of pain.  No improvement overnight so proceeding with diagnostic laparoscopy, robotic assisted lap appy.  See op note for details.  Post op, recovered as expected.  Initial pain resolved.  Stable for d/c with tolerance of diet, pain controlled with oral meds  Consults: None  Discharge Exam: Blood pressure 126/83, pulse 93, temperature 98 F (36.7 C), temperature source Oral, resp. rate 16, height 5\' 5"  (1.651 m), weight 83.9 kg, SpO2 96 %. General appearance: alert, cooperative and no distress GI: soft, no guarding, appropriate TTP around incision sites, which are c/d/i. no further TTP in RLQ  Disposition:  Discharge disposition: 01-Home or Self Care       Discharge Instructions    Discharge patient   Complete by: As directed    OK TO D/C ONCE TOLERATING REGULAR DIET FOR LUNCH   Discharge disposition: 01-Home or Self Care   Discharge patient date: 04/09/2020     Allergies as of 04/09/2020      Reactions   Nsaids Other (See Comments)   Internal bleeding  Intestinal bleeding   Adhesive [tape] Rash   Aspirin Other (See Comments)   Cannot take due to ulcers.   Penicillins Rash   Has patient had a PCN reaction causing immediate rash, facial/tongue/throat swelling, SOB or lightheadedness with hypotension: Yes Has patient had a PCN reaction causing severe rash involving mucus membranes or skin necrosis: No Has patient had a PCN reaction that required hospitalization: No Has patient had a PCN reaction occurring within the last 10 years: Yes If all of the above answers are "NO", then may proceed with Cephalosporin use. Patient tolerated  ANCEF administration       Medication List    TAKE these medications   acetaminophen 325 MG tablet Commonly known as: Tylenol Take 2 tablets (650 mg total) by mouth every 8 (eight) hours as needed for mild pain.   amitriptyline 10 MG tablet Commonly known as: ELAVIL Take 40 mg by mouth at bedtime.   docusate sodium 100 MG capsule Commonly known as: Colace Take 1 capsule (100 mg total) by mouth 2 (two) times daily as needed for up to 10 days for mild constipation.   gabapentin 300 MG capsule Commonly known as: NEURONTIN Take 900-1,200 mg by mouth See admin instructions. Take 3 capsules (900mg ) by mouth every morning, take 3 capsules (900mg ) by mouth daily at lunchtime, take 3 capsules (900mg ) by mouth every evening and take 4 capsules (1200mg ) by mouth every night at bedtime   levothyroxine 150 MCG tablet Commonly known as: SYNTHROID Take 150 mcg by mouth daily before breakfast.   oxyCODONE-acetaminophen 5-325 MG tablet Commonly known as: Percocet Take 1 tablet by mouth every 6 (six) hours as needed for up to 10 doses for severe pain (for breakthrough pain not relieved by tylenol).   rOPINIRole 1 MG tablet Commonly known as: REQUIP Take 1-2 mg by mouth See admin instructions. Take 1 tablet (1mg ) by mouth every morning and take 2 tablets (2mg ) by mouth every night at bedtime   tiZANidine 4 MG tablet Commonly known as: ZANAFLEX Take 4 mg by mouth every 6 (six) hours as needed for muscle spasms.   venlafaxine XR 75 MG 24  hr capsule Commonly known as: EFFEXOR-XR Take 75 mg by mouth daily.       Follow-up Information    Lysle Pearl, Declin Rajan, DO Follow up in 2 week(s).   Specialty: Surgery Why: post op lap appy Contact information: [address] [place] [address] [phone]                Total time spent arranging discharge was >3min. Signed: Benjamine Sprague 04/09/2020, 9:52 AM

## 2020-04-09 NOTE — Discharge Instructions (Signed)
Laparoscopic Appendectomy, Care After This sheet gives you information about how to care for yourself after your procedure. Your doctor may also give you more specific instructions. If you have problems or questions, contact your doctor. Follow these instructions at home: Care for cuts from surgery (incisions)   Follow instructions from your doctor about how to take care of your cuts from surgery. Make sure you: ? Wash your hands with soap and water before you change your bandage (dressing). If you cannot use soap and water, use hand sanitizer. ? Change your bandage as told by your doctor. ? Leave stitches (sutures), skin glue, or skin tape (adhesive) strips in place. They may need to stay in place for 2 weeks or longer. If tape strips get loose and curl up, you may trim the loose edges. Do not remove tape strips completely unless your doctor says it is okay.  Do not take baths, swim, or use a hot tub until your doctor says it is okay. OK TO SHOWER 24HRS AFTER YOUR SURGERY.   Check your surgical cut area every day for signs of infection. Check for: ? More redness, swelling, or pain. ? More fluid or blood. ? Warmth. ? Pus or a bad smell. Activity  Do not drive or use heavy machinery while taking prescription pain medicine.  Do not play contact sports until your doctor says it is okay.  Do not drive for 24 hours if you were given a medicine to help you relax (sedative).  Rest as needed. Do not return to work or school until your doctor says it is okay. General instructions .  tylenolas needed for discomfort.   .  Use narcotics, if prescribed, only when tylenolis not enough to control pain. .  325-650mg  every 8hrs to max of 3000mg /24hrs (including the 325mg  in every norco dose) for the tylenol.    To prevent or treat constipation while you are taking prescription pain medicine, your doctor may recommend that you: ? Drink enough fluid to keep your pee (urine) clear or pale  yellow. ? Take over-the-counter or prescription medicines. ? Eat foods that are high in fiber, such as fresh fruits and vegetables, whole grains, and beans. ? Limit foods that are high in fat and processed sugars, such as fried and sweet foods. Contact a doctor if:  You develop a rash.  You have more redness, swelling, or pain around your surgical cuts.  You have more fluid or blood coming from your surgical cuts.  Your surgical cuts feel warm to the touch.  You have pus or a bad smell coming from your surgical cuts.  You have a fever.  One or more of your surgical cuts breaks open. Get help right away if:  You have trouble breathing.  You have chest pain.  You have pain that is getting worse in your shoulders.  You faint or feel dizzy when you stand.  You have very bad pain in your belly (abdomen).  You are sick to your stomach (nauseous) for more than one day.  You have throwing up (vomiting) that lasts for more than one day.  You have leg pain. This information is not intended to replace advice given to you by your health care provider. Make sure you discuss any questions you have with your health care provider. Document Released: 01/17/2008 Document Revised: 10/29/2015 Document Reviewed: 09/26/2015 Elsevier Interactive Patient Education  2019 [employer].

## 2020-04-11 ENCOUNTER — Encounter: Payer: Self-pay | Admitting: Surgery

## 2020-04-11 ENCOUNTER — Telehealth: Payer: Self-pay | Admitting: *Deleted

## 2020-04-11 NOTE — Telephone Encounter (Signed)
Transition Care Management Follow-up Telephone Call  Date of discharge and from where: 04/09/20 [facility]  How have you been since you were released from the hospital? "Doing better but nauseated today."  Any questions or concerns? No  Items Reviewed:  Did the pt receive and understand the discharge instructions provided? Yes   Medications obtained and verified? Yes   Other? No   Any new allergies since your discharge? No   Dietary orders reviewed? Yes  Do you have support at home? Yes   Home Care and Equipment/Supplies: Were home health services ordered? not applicable If so, what is the name of the agency? N/A  Has the agency set up a time to come to the patient's home? not applicable Were any new equipment or medical supplies ordered?  No What is the name of the medical supply agency? N/A Were you able to get the supplies/equipment? not applicable Do you have any questions related to the use of the equipment or supplies? No  Functional Questionnaire: (I = Independent and D = Dependent) ADLs: I  Bathing/Dressing- I  Meal Prep- I  Eating- I  Maintaining continence- I  Transferring/Ambulation- D  Managing Meds- I  Follow up appointments reviewed:   PCP Hospital f/u appt confirmed? No    Specialist Hospital f/u appt confirmed? No    Are transportation arrangements needed? No   If their condition worsens, is the pt aware to call PCP or go to the Emergency Dept.? Yes  Was the patient provided with contact information for the PCP's office or ED? Yes  Was to pt encouraged to call back with questions or concerns? Yes

## 2020-04-11 NOTE — Anesthesia Postprocedure Evaluation (Signed)
Anesthesia Post Note  Patient: Jenna Tyler  Procedure(s) Performed: XI ROBOTIC LAPAROSCOPIC ASSISTED APPENDECTOMY (N/A )  Patient location during evaluation: PACU Anesthesia Type: General Level of consciousness: awake and alert and oriented Pain management: pain level controlled Vital Signs Assessment: post-procedure vital signs reviewed and stable Respiratory status: spontaneous breathing Cardiovascular status: blood pressure returned to baseline Anesthetic complications: no   No complications documented.   Last Vitals:  Vitals:   04/09/20 0439 04/09/20 0818  BP: 119/70 126/83  Pulse: 81 93  Resp: 12 16  Temp: 36.7 C 36.7 C  SpO2: 96% 96%    Last Pain:  Vitals:   04/09/20 1133  TempSrc:   PainSc: 6                  [name]

## 2020-04-12 ENCOUNTER — Other Ambulatory Visit: Payer: Self-pay | Admitting: Surgery

## 2020-04-12 ENCOUNTER — Other Ambulatory Visit: Payer: Self-pay

## 2020-04-12 ENCOUNTER — Ambulatory Visit
Admission: RE | Admit: 2020-04-12 | Discharge: 2020-04-12 | Disposition: A | Discharge status: Home / Self Care | Payer: Medicaid Other | Source: Ambulatory Visit | Attending: Surgery | Admitting: Surgery

## 2020-04-12 DIAGNOSIS — K353 Acute appendicitis with localized peritonitis, without perforation or gangrene: Secondary | ICD-10-CM | POA: Diagnosis present

## 2020-04-12 MED ORDER — IOHEXOL 300 MG/ML  SOLN
100.0000 mL | Freq: Once | INTRAMUSCULAR | Status: AC | PRN
  Administered 2020-04-12: 100 mL via INTRAVENOUS

## 2020-04-13 LAB — SURGICAL PATHOLOGY

## 2020-07-15 ENCOUNTER — Other Ambulatory Visit: Payer: Self-pay

## 2020-07-15 ENCOUNTER — Encounter: Payer: Self-pay | Admitting: Emergency Medicine

## 2020-07-15 ENCOUNTER — Emergency Department: Payer: Medicaid Other

## 2020-07-15 ENCOUNTER — Emergency Department
Admission: EM | Admit: 2020-07-15 | Discharge: 2020-07-15 | Disposition: A | Discharge status: Home / Self Care | Payer: Medicaid Other | Attending: Emergency Medicine | Admitting: Emergency Medicine

## 2020-07-15 DIAGNOSIS — Z79899 Other long term (current) drug therapy: Secondary | ICD-10-CM | POA: Diagnosis not present

## 2020-07-15 DIAGNOSIS — J40 Bronchitis, not specified as acute or chronic: Secondary | ICD-10-CM | POA: Diagnosis not present

## 2020-07-15 DIAGNOSIS — I10 Essential (primary) hypertension: Secondary | ICD-10-CM | POA: Diagnosis not present

## 2020-07-15 DIAGNOSIS — Z87891 Personal history of nicotine dependence: Secondary | ICD-10-CM | POA: Diagnosis not present

## 2020-07-15 DIAGNOSIS — J449 Chronic obstructive pulmonary disease, unspecified: Secondary | ICD-10-CM | POA: Diagnosis not present

## 2020-07-15 DIAGNOSIS — R0602 Shortness of breath: Secondary | ICD-10-CM | POA: Diagnosis present

## 2020-07-15 DIAGNOSIS — E039 Hypothyroidism, unspecified: Secondary | ICD-10-CM | POA: Insufficient documentation

## 2020-07-15 LAB — CBC WITH DIFFERENTIAL/PLATELET
Abs Immature Granulocytes: 0.03 10*3/uL (ref 0.00–0.07)
Basophils Absolute: 0.1 10*3/uL (ref 0.0–0.1)
Basophils Relative: 1 %
Eosinophils Absolute: 0.2 10*3/uL (ref 0.0–0.5)
Eosinophils Relative: 2 %
HCT: 45.2 % (ref 36.0–46.0)
Hemoglobin: 15.4 g/dL — ABNORMAL HIGH (ref 12.0–15.0)
Immature Granulocytes: 0 %
Lymphocytes Relative: 23 %
Lymphs Abs: 2.3 10*3/uL (ref 0.7–4.0)
MCH: 27.5 pg (ref 26.0–34.0)
MCHC: 34.1 g/dL (ref 30.0–36.0)
MCV: 80.7 fL (ref 80.0–100.0)
Monocytes Absolute: 0.7 10*3/uL (ref 0.1–1.0)
Monocytes Relative: 7 %
Neutro Abs: 6.6 10*3/uL (ref 1.7–7.7)
Neutrophils Relative %: 67 %
Platelets: 338 10*3/uL (ref 150–400)
RBC: 5.6 MIL/uL — ABNORMAL HIGH (ref 3.87–5.11)
RDW: 13.8 % (ref 11.5–15.5)
WBC: 9.9 10*3/uL (ref 4.0–10.5)
nRBC: 0 % (ref 0.0–0.2)

## 2020-07-15 LAB — BASIC METABOLIC PANEL
Anion gap: 8 (ref 5–15)
BUN: 15 mg/dL (ref 6–20)
CO2: 20 mmol/L — ABNORMAL LOW (ref 22–32)
Calcium: 9.4 mg/dL (ref 8.9–10.3)
Chloride: 108 mmol/L (ref 98–111)
Creatinine, Ser: 0.86 mg/dL (ref 0.44–1.00)
GFR, Estimated: >60 mL/min (ref >60)
Glucose, Bld: 111 mg/dL — ABNORMAL HIGH (ref 70–99)
Potassium: 3.9 mmol/L (ref 3.5–5.1)
Sodium: 136 mmol/L (ref 135–145)

## 2020-07-15 MED ORDER — IPRATROPIUM-ALBUTEROL 0.5-2.5 (3) MG/3ML IN SOLN
3.0000 mL | Freq: Once | RESPIRATORY_TRACT | Status: AC
  Administered 2020-07-15: 3 mL via RESPIRATORY_TRACT
  Filled 2020-07-15: qty 3

## 2020-07-15 MED ORDER — METHYLPREDNISOLONE SODIUM SUCC 125 MG IJ SOLR
125.0000 mg | Freq: Once | INTRAMUSCULAR | Status: AC
  Administered 2020-07-15: 125 mg via INTRAVENOUS
  Filled 2020-07-15: qty 2

## 2020-07-15 NOTE — Discharge Instructions (Addendum)
Please seek medical attention for any high fevers, chest pain, shortness of breath, change in behavior, persistent vomiting, bloody stool or any other new or concerning symptoms.  

## 2020-07-15 NOTE — ED Triage Notes (Signed)
First Nurse Note:   Arrives with c/o cough and SOB x 2 days.  States has had similar episode in the past which lead to respiratory failure.  AAOx3.  Skin warm and dry.  Dry cough.  Some DOE noted.

## 2020-07-15 NOTE — ED Triage Notes (Signed)
Pt to ED via POV c/o dry cough and shortness of breath. Pt states that she has had cough since Wednesday and the shortness of breath started 1 hour ago. Pt states that she has used inhaler and nebulizer without relief. Pt states that she has hx/o respiratory failure related to same symptoms in the past. Pt states that she has not been intubated but was on BiPap last time.

## 2020-07-15 NOTE — ED Provider Notes (Signed)
[facility] Emergency Department Provider Note   ____________________________________________   I have reviewed the triage vital signs and the nursing notes.   HISTORY  Chief Complaint Cough and Shortness of Breath   History limited by: Not Limited   HPI Jenna Tyler is a 55 y.o. female who presents to the emergency department today because of concerns for cough and shortness of breath.  Patient states that her symptoms started 3 days ago.  However cough has been fairly persistent.  It is nonproductive.  She has started developing some chest pain associated with cough.  Patient states she has a history of asthma and has tried her inhaler and nebulizer at home without any significant relief.  Has contacted her pulmonologist who prescribed prednisone and antibiotics although states she felt like she was getting worse and wanted to come in to be seen.  She states she has a history of significant respiratory illness in the past requiring BiPAP. Denies any fevers.    Records reviewed. Per medical record review patient has a history of asthma.   Past Medical History:  Diagnosis Date  . Asthma   . Guillain Barr syndrome ([place])   . Hyperlipidemia   . Hypertension   . Hypothyroidism   . 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 Pancreatitis     Patient Active Problem List   Diagnosis Date Noted  . Abdominal pain 04/07/2020  . Adaptation reaction 07/14/2019  . Allergic rhinitis, seasonal 07/14/2019  . Breast lump 07/14/2019  . Cephalalgia 07/14/2019  . Chronic pain associated with significant psychosocial dysfunction 07/14/2019  . Encounter for screening for lipoid disorders 07/14/2019  . Feeling bilious 07/14/2019  . Feeling stressed out 07/14/2019  . Immunization, tetanus toxoid 07/14/2019  . Infection of the upper respiratory tract 07/14/2019  . Urethra, 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  . Uterine spasm 07/14/2019  . Breath shortness 07/14/2019   . Breathlessness on exertion 07/14/2019  . SOB (shortness of breath)   . Sepsis without septic shock ([place]) 07/10/2019  . Easy bruising 07/02/2019  . Polycythemia 07/02/2019  . Tremor 05/29/2019  . Cognitive decline 09/29/2018  . CAP (community acquired pneumonia) 08/28/2018  . Seizures ([place]) 01/15/2018  . Leg pain 10/18/2017  . Lower extremity weakness 10/18/2017  . Weakness 09/16/2017  . Lower extremity pain, bilateral 09/10/2017  . Other symptoms and signs involving the musculoskeletal system 07/26/2017  . AKI (acute kidney injury) ([place]) 07/12/2017  . HTN (hypertension) 12/29/2016  . COPD exacerbation ([place]) 12/29/2016  . Substance induced mood disorder ([place]) 12/29/2016  . Alcohol use disorder, severe, dependence ([place]) 12/27/2016  . 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 Vomiting of fecal matter 08/15/2016  . Globus sensation 08/15/2016  . Multiple somatic complaints 08/15/2016  . Multiple duodenal ulcers 01/24/2016  . Prediabetes 07/07/2015  . 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 Acquired hypothyroidism 04/04/2015  . Pre-diabetes 04/04/2015  . Vitamin D deficiency 04/04/2015  . Borderline diabetes mellitus 04/04/2015  . Anxiety 10/12/2014  . Anxiety, generalized 10/12/2014  . H/O: hypothyroidism 10/12/2014  . Insomnia, persistent 10/12/2014  . Cannot sleep 10/12/2014  . Depression, major, recurrent, moderate ([place]) 10/12/2014  . Drug abuse, opioid type ([place]) 10/12/2014  . Nondependent opioid abuse in remission ([place]) 10/12/2014  . Generalized anxiety disorder 10/12/2014  . Moderate episode of recurrent major depressive disorder ([place]) 10/12/2014  . Opioid abuse ([place]) 10/12/2014  . History of hypothyroidism 10/12/2014  . 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  Severe protein-calorie malnutrition ([place]) 07/30/2014  . Nausea & vomiting 07/07/2014  . Hypothyroidism, unspecified 07/07/2014  .  Depression with anxiety 07/07/2014  . 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/2013  . Lipoma of abdominal wall 09/15/2013  . Pleurisy 08/17/2010  . Vomiting and diarrhea 01/23/2010  . Cough 11/24/2009  . Postmenopausal atrophic vaginitis 11/21/2009  . Lumbar back sprain 10/10/2009  . Clinical depression 07/12/2009  . Arthralgia of multiple joints 06/30/2009  . Benign neoplasm of thyroid gland 05/27/2009  . Fast heart beat 05/10/2009  . Persistent insomnia 02/07/2009  . Mixed disorder as reaction to stress 10/09/2008  . Boil of eyelid 08/01/2007  . Chest pain on breathing 06/03/2007  . LBP (low back pain) 03/17/2007  . Epigastric pain 11/27/2006  . Hypoglycemia 11/06/2005  . Addiction, opium ([place]) 04/23/2002  . Headache, migraine 04/23/1998  . 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 ABDOMINAL HYSTERECTOMY    . BREAST BIOPSY Right 2012   negative  . CHOLECYSTECTOMY    . 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 OTHER SURGICAL HISTORY     excision of lipoma  . OTHER SURGICAL HISTORY     Abdominal surgery  . THYROID SURGERY     thyroidectomy  . XI ROBOTIC LAPAROSCOPIC ASSISTED APPENDECTOMY N/A 04/08/2020   Procedure: XI ROBOTIC LAPAROSCOPIC ASSISTED APPENDECTOMY;  Surgeon: Benjamine Sprague, DO;  Location: [facility] ORS;  Service: General;  Laterality: N/A;    Prior to Admission medications   Medication Sig Start Date End Date Taking? Authorizing Provider  amitriptyline (ELAVIL) 10 MG tablet Take 40 mg by mouth at bedtime.    [provider]  gabapentin (NEURONTIN) 300 MG capsule Take 900-1,200 mg by mouth See admin instructions. Take 3 capsules (900mg ) by mouth every morning, take 3 capsules (900mg ) by mouth daily at lunchtime, take 3 capsules (900mg ) by mouth every evening and take 4 capsules (1200mg ) by mouth every night at bedtime 07/06/19   [provider]  levothyroxine (SYNTHROID) 150 MCG tablet Take 150 mcg  by mouth daily before breakfast.    [provider]  oxyCODONE-acetaminophen (PERCOCET) 5-325 MG tablet Take 1 tablet by mouth every 6 (six) hours as needed for up to 10 doses for severe pain (for breakthrough pain not relieved by tylenol). 04/09/20   Lysle Pearl, Isami, DO  oxyCODONE-acetaminophen (PERCOCET) 5-325 MG tablet Take 1 tablet by mouth every 6 (six) hours as needed for up to 10 doses for severe pain. 04/09/20   Lysle Pearl, Isami, DO  rOPINIRole (REQUIP) 1 MG tablet Take 1-2 mg by mouth See admin instructions. Take 1 tablet (1mg ) by mouth every morning and take 2 tablets (2mg ) by mouth every night at bedtime    [provider]  tiZANidine (ZANAFLEX) 4 MG tablet Take 4 mg by mouth every 6 (six) hours as needed for muscle spasms.     [provider]  venlafaxine XR (EFFEXOR-XR) 75 MG 24 hr capsule Take 75 mg by mouth daily.    [provider]    Allergies Nsaids, Adhesive [tape], Aspirin, and Penicillins  Family History  Problem Relation Age of Onset  . Breast cancer Sister 3  . Breast cancer Maternal Aunt 22  . Breast cancer Maternal Grandmother 60  . Breast cancer Maternal Aunt 48  . Breast cancer Cousin 40       maternal side    Social History Social History   Tobacco Use  . Smoking status: Former Smoker  Quit date: 12/28/1997    Years since quitting: 22.5  . Smokeless tobacco: Never Used  Vaping Use  . Vaping Use: Never used  Substance Use Topics  . Alcohol use: Not Currently    Alcohol/week: 0.0 standard drinks  . Drug use: No    Review of Systems Constitutional: No fever/chills Eyes: No visual changes. ENT: No sore throat. Cardiovascular: Positive for chest pain. Respiratory: Positive for cough and shortness of breath. Gastrointestinal: No abdominal pain.  No nausea, no vomiting.  No diarrhea.   Genitourinary: Negative for dysuria. Musculoskeletal: Negative for back pain. Skin: Negative for rash. Neurological: Negative for  headaches, focal weakness or numbness.  ____________________________________________   PHYSICAL EXAM:  VITAL SIGNS: ED Triage Vitals  Enc Vitals Group     BP 07/15/20 1557 (!) 133/91     Pulse Rate 07/15/20 1557 98     Resp 07/15/20 1557 20     Temp 07/15/20 1557 98.3 F (36.8 C)     Temp Source 07/15/20 1557 Oral     SpO2 07/15/20 1557 98 %     Weight 07/15/20 1558 180 lb (81.6 kg)     Height 07/15/20 1558 5\' 5"  (1.651 m)     Head Circumference --      Peak Flow --      Pain Score 07/15/20 1558 0   Constitutional: Alert and oriented.  Eyes: Conjunctivae are normal.  ENT      Head: Normocephalic and atraumatic.      Nose: No congestion/rhinnorhea.      Mouth/Throat: Mucous membranes are moist.      Neck: No stridor. Hematological/Lymphatic/Immunilogical: No cervical lymphadenopathy. Cardiovascular: Normal rate, regular rhythm.  No murmurs, rubs, or gallops.  Respiratory: Normal respiratory effort without tachypnea nor retractions. Frequent dry cough. No rhonchi appreciated.  Gastrointestinal: Soft and non tender. No rebound. No guarding.  Genitourinary: Deferred Musculoskeletal: Normal range of motion in all extremities. No lower extremity edema. Neurologic:  Normal speech and language. No gross focal neurologic deficits are appreciated.  Skin:  Skin is warm, dry and intact. No rash noted. Psychiatric: Mood and affect are normal. Speech and behavior are normal. Patient exhibits appropriate insight and judgment.  ____________________________________________    LABS (pertinent positives/negatives)  BMP na 136, k 3.9, glu 111, cr 0.86 CBC wbc 9.9, hgb 15.4, plt 338  ____________________________________________   EKG  I, Nance Pear, attending physician, personally viewed and interpreted this EKG  EKG Time: 1602 Rate: 99 Rhythm: normal sinus rhythm Axis: normal Intervals: qtc 544 QRS: narrow, q waves II, aVF ST changes: no st elevation Impression:  abnormal ekg   ____________________________________________    RADIOLOGY  CXR Bronchitis pattern   ____________________________________________   PROCEDURES  Procedures  ____________________________________________   INITIAL IMPRESSION / ASSESSMENT AND PLAN / ED COURSE  Pertinent labs & imaging results that were available during my care of the patient were reviewed by me and considered in my medical decision making (see chart for details).   Patient presented to the emergency department today because of concern for cough and shortness of breath. CXR here is not consistent with pneumonia. Patient felt better after duoneb and steroids. Discussed with patient likely bronchitis. Doubt PE. Patient was given prescription for antibiotics and prednisone by pulmonologist. Encouraged patient to take those. Discussed return precautions.    ____________________________________________   FINAL CLINICAL IMPRESSION(S) / ED DIAGNOSES  Final diagnoses:  Bronchitis     Note: This dictation was prepared with Dragon dictation. Any transcriptional errors that  result from this process are unintentional     Nance Pear, MD 07/15/20 [phone]

## 2020-07-17 ENCOUNTER — Other Ambulatory Visit: Payer: Self-pay

## 2020-07-17 ENCOUNTER — Emergency Department: Payer: Medicaid Other

## 2020-07-17 ENCOUNTER — Inpatient Hospital Stay
Admission: EM | Admit: 2020-07-17 | Discharge: 2020-07-19 | DRG: 190 | Disposition: A | Discharge status: Home / Self Care | Payer: Medicaid Other | Attending: Internal Medicine | Admitting: Internal Medicine

## 2020-07-17 DIAGNOSIS — F1011 Alcohol abuse, in remission: Secondary | ICD-10-CM | POA: Diagnosis present

## 2020-07-17 DIAGNOSIS — G894 Chronic pain syndrome: Secondary | ICD-10-CM | POA: Diagnosis present

## 2020-07-17 DIAGNOSIS — I10 Essential (primary) hypertension: Secondary | ICD-10-CM | POA: Diagnosis present

## 2020-07-17 DIAGNOSIS — Z79899 Other long term (current) drug therapy: Secondary | ICD-10-CM | POA: Diagnosis not present

## 2020-07-17 DIAGNOSIS — X58XXXA Exposure to other specified factors, initial encounter: Secondary | ICD-10-CM | POA: Diagnosis present

## 2020-07-17 DIAGNOSIS — Z87891 Personal history of nicotine dependence: Secondary | ICD-10-CM | POA: Diagnosis not present

## 2020-07-17 DIAGNOSIS — F1020 Alcohol dependence, uncomplicated: Secondary | ICD-10-CM

## 2020-07-17 DIAGNOSIS — Z880 Allergy status to penicillin: Secondary | ICD-10-CM | POA: Diagnosis not present

## 2020-07-17 DIAGNOSIS — J9601 Acute respiratory failure with hypoxia: Secondary | ICD-10-CM | POA: Diagnosis present

## 2020-07-17 DIAGNOSIS — Z7951 Long term (current) use of inhaled steroids: Secondary | ICD-10-CM

## 2020-07-17 DIAGNOSIS — Z9049 Acquired absence of other specified parts of digestive tract: Secondary | ICD-10-CM | POA: Diagnosis not present

## 2020-07-17 DIAGNOSIS — E785 Hyperlipidemia, unspecified: Secondary | ICD-10-CM | POA: Diagnosis present

## 2020-07-17 DIAGNOSIS — D72829 Elevated white blood cell count, unspecified: Secondary | ICD-10-CM | POA: Diagnosis present

## 2020-07-17 DIAGNOSIS — Z888 Allergy status to other drugs, medicaments and biological substances status: Secondary | ICD-10-CM | POA: Diagnosis not present

## 2020-07-17 DIAGNOSIS — Z79890 Hormone replacement therapy (postmenopausal): Secondary | ICD-10-CM

## 2020-07-17 DIAGNOSIS — E039 Hypothyroidism, unspecified: Secondary | ICD-10-CM | POA: Diagnosis not present

## 2020-07-17 DIAGNOSIS — J40 Bronchitis, not specified as acute or chronic: Secondary | ICD-10-CM | POA: Diagnosis present

## 2020-07-17 DIAGNOSIS — R0789 Other chest pain: Secondary | ICD-10-CM | POA: Diagnosis present

## 2020-07-17 DIAGNOSIS — Z803 Family history of malignant neoplasm of breast: Secondary | ICD-10-CM | POA: Diagnosis not present

## 2020-07-17 DIAGNOSIS — Z886 Allergy status to analgesic agent status: Secondary | ICD-10-CM | POA: Diagnosis not present

## 2020-07-17 DIAGNOSIS — T380X5A Adverse effect of glucocorticoids and synthetic analogues, initial encounter: Secondary | ICD-10-CM | POA: Diagnosis present

## 2020-07-17 DIAGNOSIS — Z20822 Contact with and (suspected) exposure to covid-19: Secondary | ICD-10-CM | POA: Diagnosis present

## 2020-07-17 DIAGNOSIS — F418 Other specified anxiety disorders: Secondary | ICD-10-CM | POA: Diagnosis present

## 2020-07-17 DIAGNOSIS — J441 Chronic obstructive pulmonary disease with (acute) exacerbation: Principal | ICD-10-CM | POA: Diagnosis present

## 2020-07-17 DIAGNOSIS — R9431 Abnormal electrocardiogram [ECG] [EKG]: Secondary | ICD-10-CM | POA: Diagnosis present

## 2020-07-17 DIAGNOSIS — Z90710 Acquired absence of both cervix and uterus: Secondary | ICD-10-CM | POA: Diagnosis not present

## 2020-07-17 HISTORY — DX: Dyspnea, unspecified: R06.00

## 2020-07-17 HISTORY — DX: Chronic obstructive pulmonary disease, unspecified: J44.9

## 2020-07-17 LAB — BASIC METABOLIC PANEL
Anion gap: 12 (ref 5–15)
BUN: 25 mg/dL — ABNORMAL HIGH (ref 6–20)
CO2: 17 mmol/L — ABNORMAL LOW (ref 22–32)
Calcium: 9 mg/dL (ref 8.9–10.3)
Chloride: 105 mmol/L (ref 98–111)
Creatinine, Ser: 1.09 mg/dL — ABNORMAL HIGH (ref 0.44–1.00)
GFR, Estimated: >60 mL/min (ref >60)
Glucose, Bld: 181 mg/dL — ABNORMAL HIGH (ref 70–99)
Potassium: 3.9 mmol/L (ref 3.5–5.1)
Sodium: 134 mmol/L — ABNORMAL LOW (ref 135–145)

## 2020-07-17 LAB — CBC WITH DIFFERENTIAL/PLATELET
Abs Immature Granulocytes: 0.09 10*3/uL — ABNORMAL HIGH (ref 0.00–0.07)
Basophils Absolute: 0.1 10*3/uL (ref 0.0–0.1)
Basophils Relative: 1 %
Eosinophils Absolute: 0.2 10*3/uL (ref 0.0–0.5)
Eosinophils Relative: 1 %
HCT: 45.3 % (ref 36.0–46.0)
Hemoglobin: 15 g/dL (ref 12.0–15.0)
Immature Granulocytes: 1 %
Lymphocytes Relative: 23 %
Lymphs Abs: 3.6 10*3/uL (ref 0.7–4.0)
MCH: 27.3 pg (ref 26.0–34.0)
MCHC: 33.1 g/dL (ref 30.0–36.0)
MCV: 82.4 fL (ref 80.0–100.0)
Monocytes Absolute: 1.2 10*3/uL — ABNORMAL HIGH (ref 0.1–1.0)
Monocytes Relative: 8 %
Neutro Abs: 10.8 10*3/uL — ABNORMAL HIGH (ref 1.7–7.7)
Neutrophils Relative %: 66 %
Platelets: 367 10*3/uL (ref 150–400)
RBC: 5.5 MIL/uL — ABNORMAL HIGH (ref 3.87–5.11)
RDW: 14.1 % (ref 11.5–15.5)
WBC: 16 10*3/uL — ABNORMAL HIGH (ref 4.0–10.5)
nRBC: 0 % (ref 0.0–0.2)

## 2020-07-17 LAB — RESP PANEL BY RT-PCR (FLU A&B, COVID) ARPGX2
Influenza A by PCR: NEGATIVE
Influenza B by PCR: NEGATIVE
SARS Coronavirus 2 by RT PCR: NEGATIVE

## 2020-07-17 LAB — PROCALCITONIN: Procalcitonin: <0.1 ng/mL

## 2020-07-17 LAB — MAGNESIUM: Magnesium: 2.1 mg/dL (ref 1.7–2.4)

## 2020-07-17 LAB — TROPONIN I (HIGH SENSITIVITY)
Troponin I (High Sensitivity): 3 ng/L (ref <18)
Troponin I (High Sensitivity): 3 ng/L (ref <18)

## 2020-07-17 LAB — BRAIN NATRIURETIC PEPTIDE: B Natriuretic Peptide: 8.2 pg/mL (ref 0.0–100.0)

## 2020-07-17 LAB — HIV ANTIBODY (ROUTINE TESTING W REFLEX): HIV Screen 4th Generation wRfx: NONREACTIVE

## 2020-07-17 LAB — D-DIMER, QUANTITATIVE: D-Dimer, Quant: <0.27 ug/mL-FEU (ref 0.00–0.50)

## 2020-07-17 MED ORDER — MOMETASONE FURO-FORMOTEROL FUM 200-5 MCG/ACT IN AERO
2.0000 | INHALATION_SPRAY | Freq: Two times a day (BID) | RESPIRATORY_TRACT | Status: DC
  Administered 2020-07-17 – 2020-07-18 (×3): 2 via RESPIRATORY_TRACT
  Filled 2020-07-17: qty 8.8

## 2020-07-17 MED ORDER — HYDRALAZINE HCL 20 MG/ML IJ SOLN: 5.0000 mg | INTRAMUSCULAR | Status: DC | PRN

## 2020-07-17 MED ORDER — DOXYCYCLINE HYCLATE 100 MG PO TABS
100.0000 mg | ORAL_TABLET | Freq: Two times a day (BID) | ORAL | Status: DC
  Administered 2020-07-17 – 2020-07-18 (×3): 100 mg via ORAL
  Filled 2020-07-17 (×3): qty 1

## 2020-07-17 MED ORDER — METHOCARBAMOL 500 MG PO TABS
750.0000 mg | ORAL_TABLET | Freq: Three times a day (TID) | ORAL | Status: DC | PRN
  Administered 2020-07-17 (×2): 750 mg via ORAL
  Filled 2020-07-17 (×2): qty 2

## 2020-07-17 MED ORDER — IPRATROPIUM-ALBUTEROL 0.5-2.5 (3) MG/3ML IN SOLN
3.0000 mL | Freq: Once | RESPIRATORY_TRACT | Status: AC
  Administered 2020-07-17: 3 mL via RESPIRATORY_TRACT

## 2020-07-17 MED ORDER — ENOXAPARIN SODIUM 40 MG/0.4ML ~~LOC~~ SOLN
40.0000 mg | SUBCUTANEOUS | Status: DC
  Administered 2020-07-17 – 2020-07-18 (×2): 40 mg via SUBCUTANEOUS
  Filled 2020-07-17 (×2): qty 0.4

## 2020-07-17 MED ORDER — GABAPENTIN 300 MG PO CAPS
900.0000 mg | ORAL_CAPSULE | Freq: Every morning | ORAL | Status: DC
  Administered 2020-07-18: 900 mg via ORAL
  Filled 2020-07-17: qty 9

## 2020-07-17 MED ORDER — ADULT MULTIVITAMIN W/MINERALS CH
1.0000 | ORAL_TABLET | Freq: Every day | ORAL | Status: DC
  Administered 2020-07-17 – 2020-07-18 (×2): 1 via ORAL
  Filled 2020-07-17 (×2): qty 1

## 2020-07-17 MED ORDER — BACLOFEN 10 MG PO TABS
10.0000 mg | ORAL_TABLET | Freq: Every day | ORAL | Status: DC
  Administered 2020-07-17 – 2020-07-19 (×3): 10 mg via ORAL
  Filled 2020-07-17 (×3): qty 1

## 2020-07-17 MED ORDER — GABAPENTIN 300 MG PO CAPS: 900.0000 mg | ORAL_CAPSULE | ORAL | Status: DC

## 2020-07-17 MED ORDER — IPRATROPIUM-ALBUTEROL 0.5-2.5 (3) MG/3ML IN SOLN
3.0000 mL | Freq: Four times a day (QID) | RESPIRATORY_TRACT | Status: DC
  Administered 2020-07-17: 3 mL via RESPIRATORY_TRACT
  Filled 2020-07-17: qty 3

## 2020-07-17 MED ORDER — THIAMINE HCL 100 MG/ML IJ SOLN: 100.0000 mg | Freq: Every day | INTRAMUSCULAR | Status: DC

## 2020-07-17 MED ORDER — VENLAFAXINE HCL ER 75 MG PO CP24
75.0000 mg | ORAL_CAPSULE | Freq: Every day | ORAL | Status: DC
Start: 2020-07-17 — End: 2020-07-19
  Administered 2020-07-17 – 2020-07-19 (×3): 75 mg via ORAL
  Filled 2020-07-17 (×3): qty 1

## 2020-07-17 MED ORDER — IPRATROPIUM-ALBUTEROL 0.5-2.5 (3) MG/3ML IN SOLN
3.0000 mL | RESPIRATORY_TRACT | Status: DC
  Administered 2020-07-17: 3 mL via RESPIRATORY_TRACT
  Filled 2020-07-17: qty 3

## 2020-07-17 MED ORDER — LEVOTHYROXINE SODIUM 50 MCG PO TABS
150.0000 ug | ORAL_TABLET | Freq: Every day | ORAL | Status: DC
  Administered 2020-07-18 – 2020-07-19 (×2): 150 ug via ORAL
  Filled 2020-07-17 (×2): qty 3

## 2020-07-17 MED ORDER — LORAZEPAM 2 MG/ML IJ SOLN: 0.0000 mg | Freq: Four times a day (QID) | INTRAMUSCULAR | Status: DC

## 2020-07-17 MED ORDER — IPRATROPIUM-ALBUTEROL 0.5-2.5 (3) MG/3ML IN SOLN
3.0000 mL | Freq: Once | RESPIRATORY_TRACT | Status: AC
  Administered 2020-07-17: 3 mL via RESPIRATORY_TRACT
  Filled 2020-07-17: qty 9

## 2020-07-17 MED ORDER — METHYLPREDNISOLONE SODIUM SUCC 125 MG IJ SOLR
125.0000 mg | Freq: Once | INTRAMUSCULAR | Status: AC
  Administered 2020-07-17: 125 mg via INTRAVENOUS
  Filled 2020-07-17: qty 2

## 2020-07-17 MED ORDER — FOLIC ACID 1 MG PO TABS
1.0000 mg | ORAL_TABLET | Freq: Every day | ORAL | Status: DC
  Administered 2020-07-17 – 2020-07-18 (×2): 1 mg via ORAL
  Filled 2020-07-17 (×2): qty 1

## 2020-07-17 MED ORDER — OXYCODONE-ACETAMINOPHEN 5-325 MG PO TABS
1.0000 | ORAL_TABLET | ORAL | Status: DC | PRN
  Administered 2020-07-17 – 2020-07-18 (×6): 1 via ORAL
  Filled 2020-07-17 (×6): qty 1

## 2020-07-17 MED ORDER — GABAPENTIN 400 MG PO CAPS
1200.0000 mg | ORAL_CAPSULE | Freq: Every day | ORAL | Status: DC
  Administered 2020-07-17: 1200 mg via ORAL
  Filled 2020-07-17: qty 3

## 2020-07-17 MED ORDER — ACETAMINOPHEN 325 MG PO TABS
650.0000 mg | ORAL_TABLET | Freq: Four times a day (QID) | ORAL | Status: DC | PRN
  Administered 2020-07-18: 650 mg via ORAL
  Filled 2020-07-17: qty 2

## 2020-07-17 MED ORDER — LORAZEPAM 1 MG PO TABS: 1.0000 mg | ORAL_TABLET | ORAL | Status: DC | PRN

## 2020-07-17 MED ORDER — METHYLPREDNISOLONE SODIUM SUCC 40 MG IJ SOLR
40.0000 mg | Freq: Two times a day (BID) | INTRAMUSCULAR | Status: DC
  Administered 2020-07-17 – 2020-07-18 (×2): 40 mg via INTRAVENOUS
  Filled 2020-07-17 (×2): qty 1

## 2020-07-17 MED ORDER — ROPINIROLE HCL 1 MG PO TABS
1.0000 mg | ORAL_TABLET | Freq: Every morning | ORAL | Status: DC
  Administered 2020-07-17 – 2020-07-19 (×3): 1 mg via ORAL
  Filled 2020-07-17 (×3): qty 1

## 2020-07-17 MED ORDER — GABAPENTIN 300 MG PO CAPS
900.0000 mg | ORAL_CAPSULE | Freq: Every day | ORAL | Status: DC
  Administered 2020-07-17: 900 mg via ORAL
  Filled 2020-07-17: qty 3

## 2020-07-17 MED ORDER — LORAZEPAM 2 MG/ML IJ SOLN: 0.0000 mg | Freq: Two times a day (BID) | INTRAMUSCULAR | Status: DC

## 2020-07-17 MED ORDER — DM-GUAIFENESIN ER 30-600 MG PO TB12
1.0000 | ORAL_TABLET | Freq: Two times a day (BID) | ORAL | Status: DC | PRN
  Administered 2020-07-17 – 2020-07-19 (×3): 1 via ORAL
  Filled 2020-07-17 (×5): qty 1

## 2020-07-17 MED ORDER — LORAZEPAM 2 MG/ML IJ SOLN
1.0000 mg | INTRAMUSCULAR | Status: DC | PRN
Start: 2020-07-17 — End: 2020-07-17

## 2020-07-17 MED ORDER — ROPINIROLE HCL 1 MG PO TABS: 1.0000 mg | ORAL_TABLET | ORAL | Status: DC

## 2020-07-17 MED ORDER — ALBUTEROL SULFATE (2.5 MG/3ML) 0.083% IN NEBU
2.5000 mg | INHALATION_SOLUTION | RESPIRATORY_TRACT | Status: DC | PRN
  Filled 2020-07-17: qty 3

## 2020-07-17 MED ORDER — MAGNESIUM SULFATE 2 GM/50ML IV SOLN
2.0000 g | Freq: Once | INTRAVENOUS | Status: AC
  Administered 2020-07-17: 2 g via INTRAVENOUS
  Filled 2020-07-17: qty 50

## 2020-07-17 MED ORDER — HYDROXYZINE HCL 50 MG/ML IM SOLN
25.0000 mg | Freq: Four times a day (QID) | INTRAMUSCULAR | Status: DC | PRN
  Filled 2020-07-17: qty 0.5

## 2020-07-17 MED ORDER — AMITRIPTYLINE HCL 10 MG PO TABS
40.0000 mg | ORAL_TABLET | Freq: Every day | ORAL | Status: DC
  Administered 2020-07-17 – 2020-07-18 (×2): 40 mg via ORAL
  Filled 2020-07-17 (×3): qty 4

## 2020-07-17 MED ORDER — HYDROCOD POLST-CPM POLST ER 10-8 MG/5ML PO SUER
5.0000 mL | Freq: Once | ORAL | Status: AC
  Administered 2020-07-17: 5 mL via ORAL
  Filled 2020-07-17: qty 5

## 2020-07-17 MED ORDER — ROPINIROLE HCL 1 MG PO TABS
2.0000 mg | ORAL_TABLET | Freq: Every day | ORAL | Status: DC
  Administered 2020-07-17 – 2020-07-18 (×2): 2 mg via ORAL
  Filled 2020-07-17 (×2): qty 2

## 2020-07-17 MED ORDER — THIAMINE HCL 100 MG PO TABS
100.0000 mg | ORAL_TABLET | Freq: Every day | ORAL | Status: DC
  Administered 2020-07-17 – 2020-07-18 (×2): 100 mg via ORAL
  Filled 2020-07-17 (×2): qty 1

## 2020-07-17 NOTE — ED Notes (Signed)
Day shift RN messaged report

## 2020-07-17 NOTE — ED Notes (Signed)
Ambulatory pulse ox complete at this time. Oxygen levels dropped to 89% x3 and patient complaining of shortness of breath. Pulse ox changed and verified sats were reading low. Provider aware and at bedside.

## 2020-07-17 NOTE — ED Provider Notes (Signed)
[facility] Emergency Department Provider Note  ____________________________________________  Time seen: Approximately 5:58 AM  I have reviewed the triage vital signs and the nursing notes.   HISTORY  Chief Complaint Cough and Shortness of Breath   HPI Jenna Tyler is a 55 y.o. female with a history of asthma, COPD, hypertension, hyperlipidemia, Guillain-Barr syndrome who presents for evaluation of cough and shortness of breath.   Patient has had a dry cough for the last 4 days.  Was seen here yesterday and diagnosed with bronchitis.  She was sent home on steroids and a Z-Pak.  Patient reports that she was sleeping when she had a coughing fit and started having difficulty breathing.  She took 2 home nebulizer treatments with no significant relief which made her call 911.  When EMS arrived patient's lungs were clear with no wheezing and she was satting 93 to 95%.  Patient complaining of right-sided rib pain which she has had for a while.  Denies any trauma.  Denies any personal or family history of PE or DVT, no recent travel immobilization, no leg pain or swelling, no hemoptysis or exogenous hormones.  No fever, vomiting or diarrhea.  Past Medical History:  Diagnosis Date  . Asthma   . Guillain Barr syndrome ([place])   . Hyperlipidemia   . Hypertension   . Hypothyroidism   . 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 Pancreatitis     Patient Active Problem List   Diagnosis Date Noted  . Abdominal pain 04/07/2020  . Adaptation reaction 07/14/2019  . Allergic rhinitis, seasonal 07/14/2019  . Breast lump 07/14/2019  . Cephalalgia 07/14/2019  . Chronic pain associated with significant psychosocial dysfunction 07/14/2019  . Encounter for screening for lipoid disorders 07/14/2019  . Feeling bilious 07/14/2019  . Feeling stressed out 07/14/2019  . Immunization, tetanus toxoid 07/14/2019  . Infection of the upper respiratory tract 07/14/2019  .  Urethra, 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  . Uterine spasm 07/14/2019  . Breath shortness 07/14/2019  . Breathlessness on exertion 07/14/2019  . SOB (shortness of breath)   . Sepsis without septic shock ([place]) 07/10/2019  . Easy bruising 07/02/2019  . Polycythemia 07/02/2019  . Tremor 05/29/2019  . Cognitive decline 09/29/2018  . CAP (community acquired pneumonia) 08/28/2018  . Seizures ([place]) 01/15/2018  . Leg pain 10/18/2017  . Lower extremity weakness 10/18/2017  . Weakness 09/16/2017  . Lower extremity pain, bilateral 09/10/2017  . Other symptoms and signs involving the musculoskeletal system 07/26/2017  . AKI (acute kidney injury) ([place]) 07/12/2017  . HTN (hypertension) 12/29/2016  . COPD exacerbation ([place]) 12/29/2016  . Substance induced mood disorder ([place]) 12/29/2016  . Alcohol use disorder, severe, dependence ([place]) 12/27/2016  . 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 Vomiting of fecal matter 08/15/2016  . Globus sensation 08/15/2016  . Multiple somatic complaints 08/15/2016  . Multiple duodenal ulcers 01/24/2016  . Prediabetes 07/07/2015  . 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 Acquired hypothyroidism 04/04/2015  . Pre-diabetes 04/04/2015  . Vitamin D deficiency 04/04/2015  . Borderline diabetes mellitus 04/04/2015  . Anxiety 10/12/2014  . Anxiety, generalized 10/12/2014  . H/O: hypothyroidism 10/12/2014  . Insomnia, persistent 10/12/2014  . Cannot sleep 10/12/2014  . Depression, major, recurrent, moderate ([place]) 10/12/2014  . Drug abuse, opioid type ([place]) 10/12/2014  . Nondependent opioid abuse in remission ([place]) 10/12/2014  . Generalized anxiety disorder 10/12/2014  . Moderate episode of recurrent major depressive disorder ([place]) 10/12/2014  . Opioid  abuse ([place]) 10/12/2014  . History of hypothyroidism 10/12/2014  . 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 Severe protein-calorie malnutrition ([place])  07/30/2014  . Nausea & vomiting 07/07/2014  . Hypothyroidism, unspecified 07/07/2014  . Depression with anxiety 07/07/2014  . 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/2013  . Lipoma of abdominal wall 09/15/2013  . Pleurisy 08/17/2010  . Vomiting and diarrhea 01/23/2010  . Cough 11/24/2009  . Postmenopausal atrophic vaginitis 11/21/2009  . Lumbar back sprain 10/10/2009  . Clinical depression 07/12/2009  . Arthralgia of multiple joints 06/30/2009  . Benign neoplasm of thyroid gland 05/27/2009  . Fast heart beat 05/10/2009  . Persistent insomnia 02/07/2009  . Mixed disorder as reaction to stress 10/09/2008  . Boil of eyelid 08/01/2007  . Chest pain on breathing 06/03/2007  . LBP (low back pain) 03/17/2007  . Epigastric pain 11/27/2006  . Hypoglycemia 11/06/2005  . Addiction, opium ([place]) 04/23/2002  . Headache, migraine 04/23/1998  . 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 ABDOMINAL HYSTERECTOMY    . BREAST BIOPSY Right 2012   negative  . CHOLECYSTECTOMY    . 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 OTHER SURGICAL HISTORY     excision of lipoma  . OTHER SURGICAL HISTORY     Abdominal surgery  . THYROID SURGERY     thyroidectomy  . XI ROBOTIC LAPAROSCOPIC ASSISTED APPENDECTOMY N/A 04/08/2020   Procedure: XI ROBOTIC LAPAROSCOPIC ASSISTED APPENDECTOMY;  Surgeon: Benjamine Sprague, DO;  Location: [facility] ORS;  Service: General;  Laterality: N/A;    Prior to Admission medications   Medication Sig Start Date End Date Taking? Authorizing Provider  amitriptyline (ELAVIL) 10 MG tablet Take 40 mg by mouth at bedtime.    [provider]  gabapentin (NEURONTIN) 300 MG capsule Take 900-1,200 mg by mouth See admin instructions. Take 3 capsules (900mg ) by mouth every morning, take 3 capsules (900mg ) by mouth daily at lunchtime, take 3 capsules (900mg ) by mouth every evening and take 4 capsules (1200mg ) by mouth every night at bedtime  07/06/19   [provider]  levothyroxine (SYNTHROID) 150 MCG tablet Take 150 mcg by mouth daily before breakfast.    [provider]  oxyCODONE-acetaminophen (PERCOCET) 5-325 MG tablet Take 1 tablet by mouth every 6 (six) hours as needed for up to 10 doses for severe pain (for breakthrough pain not relieved by tylenol). 04/09/20   Lysle Pearl, Isami, DO  oxyCODONE-acetaminophen (PERCOCET) 5-325 MG tablet Take 1 tablet by mouth every 6 (six) hours as needed for up to 10 doses for severe pain. 04/09/20   Lysle Pearl, Isami, DO  rOPINIRole (REQUIP) 1 MG tablet Take 1-2 mg by mouth See admin instructions. Take 1 tablet (1mg ) by mouth every morning and take 2 tablets (2mg ) by mouth every night at bedtime    [provider]  tiZANidine (ZANAFLEX) 4 MG tablet Take 4 mg by mouth every 6 (six) hours as needed for muscle spasms.     [provider]  venlafaxine XR (EFFEXOR-XR) 75 MG 24 hr capsule Take 75 mg by mouth daily.    [provider]    Allergies Nsaids, Adhesive [tape], Aspirin, and Penicillins  Family History  Problem Relation Age of Onset  . Breast cancer Sister 86  . Breast cancer Maternal Aunt 26  . Breast cancer Maternal Grandmother 60  . Breast cancer Maternal Aunt 42  . Breast cancer Cousin 40       maternal side  Social History Social History   Tobacco Use  . Smoking status: Former Smoker    Quit date: 12/28/1997    Years since quitting: 22.5  . Smokeless tobacco: Never Used  Vaping Use  . Vaping Use: Never used  Substance Use Topics  . Alcohol use: Not Currently    Alcohol/week: 0.0 standard drinks  . Drug use: No    Review of Systems  Constitutional: Negative for fever. Eyes: Negative for visual changes. ENT: Negative for sore throat. Neck: No neck pain  Cardiovascular: Negative for chest pain. Respiratory: + shortness of breath and cough Gastrointestinal: Negative for abdominal pain, vomiting or diarrhea. Genitourinary:  Negative for dysuria. Musculoskeletal: Negative for back pain. Skin: Negative for rash. Neurological: Negative for headaches, weakness or numbness. Psych: No SI or HI  ____________________________________________   PHYSICAL EXAM:  VITAL SIGNS: ED Triage Vitals  Enc Vitals Group     BP 07/17/20 0209 130/88     Pulse Rate 07/17/20 0209 96     Resp 07/17/20 0209 (!) 24     Temp 07/17/20 0209 98.5 F (36.9 C)     Temp Source 07/17/20 0209 Oral     SpO2 07/17/20 0209 92 %     Weight 07/17/20 0204 178 lb 9.2 oz (81 kg)     Height 07/17/20 0204 5\' 5"  (1.651 m)     Head Circumference --      Peak Flow --      Pain Score 07/17/20 0203 6     Pain Loc --      Pain Edu? --      Excl. in [place]? --     Constitutional: Alert and oriented, actively coughing, in no respiratory distress HEENT:      Head: Normocephalic and atraumatic.         Eyes: Conjunctivae are normal. Sclera is non-icteric.       Mouth/Throat: Mucous membranes are moist.       Neck: Supple with no signs of meningismus. Cardiovascular: Regular rate and rhythm. No murmurs, gallops, or rubs. 2+ symmetrical distal pulses are present in all extremities. No JVD. Respiratory: Normal respiratory effort. Lungs are clear to auscultation bilaterally.  Gastrointestinal: Soft, non tender, and non distended. Musculoskeletal:  No edema, cyanosis, or erythema of extremities. Neurologic: Normal speech and language. Face is symmetric. Moving all extremities. No gross focal neurologic deficits are appreciated. Skin: Skin is warm, dry and intact. No rash noted. Psychiatric: Mood and affect are normal. Speech and behavior are normal.  ____________________________________________   LABS (all labs ordered are listed, but only abnormal results are displayed)  Labs Reviewed  CBC WITH DIFFERENTIAL/PLATELET - Abnormal; Notable for the following components:      Result Value   WBC 16.0 (*)    RBC 5.50 (*)    Neutro Abs 10.8 (*)     Monocytes Absolute 1.2 (*)    Abs Immature Granulocytes 0.09 (*)    All other components within normal limits  BASIC METABOLIC PANEL - Abnormal; Notable for the following components:   Sodium 134 (*)    CO2 17 (*)    Glucose, Bld 181 (*)    BUN 25 (*)    Creatinine, Ser 1.09 (*)    All other components within normal limits  RESP PANEL BY RT-PCR (FLU A&B, COVID) ARPGX2  PROCALCITONIN  D-DIMER, QUANTITATIVE  TROPONIN I (HIGH SENSITIVITY)  TROPONIN I (HIGH SENSITIVITY)   ____________________________________________  EKG  ED ECG REPORT I, Rudene Re, the attending physician,  personally viewed and interpreted this ECG.  Sinus rhythm, rate of 93, normal intervals, normal axis, diffuse T wave flattening with no ST elevations.  Unchanged from prior from 2 days ago. ____________________________________________  RADIOLOGY  I have personally reviewed the images performed during this visit and I agree with the Radiologist's read.   Interpretation by Radiologist:  DG Chest Portable 1 View  Result Date: 07/17/2020 CLINICAL DATA:  Shortness of breath and cough EXAM: PORTABLE CHEST 1 VIEW COMPARISON:  07/15/2020 FINDINGS: Cardiac shadow is mildly prominent but accentuated by the portable technique. Left basilar atelectasis is noted new from the prior study. Mild vascular congestion is noted. No bony abnormality is noted. IMPRESSION: Mild left basilar atelectasis. Mild vascular congestion. Electronically Signed   By: Inez Catalina M.D.   On: 07/17/2020 03:53     ____________________________________________   PROCEDURES  Procedure(s) performed:yes .1-3 Lead EKG Interpretation Performed by: Rudene Re, MD Authorized by: Rudene Re, MD     Interpretation: non-specific     ECG rate assessment: normal     Rhythm: sinus rhythm     Ectopy: none     Conduction: normal     Critical Care performed: yes  CRITICAL CARE Performed by: Rudene Re  ?  Total  critical care time: 35 min  Critical care time was exclusive of separately billable procedures and treating other patients.  Critical care was necessary to treat or prevent imminent or life-threatening deterioration.  Critical care was time spent personally by me on the following activities: development of treatment plan with patient and/or surrogate as well as nursing, discussions with consultants, evaluation of patient's response to treatment, examination of patient, obtaining history from patient or surrogate, ordering and performing treatments and interventions, ordering and review of laboratory studies, ordering and review of radiographic studies, pulse oximetry and re-evaluation of patient's condition.  ____________________________________________   INITIAL IMPRESSION / ASSESSMENT AND PLAN / ED COURSE   55 y.o. female with a history of asthma, COPD, hypertension, hyperlipidemia, Guillain-Barr syndrome who presents for evaluation of cough and shortness of breath.   Patient seen here yesterday.  Work-up revealed with normal blood work and a chest x-ray.  She was diagnosed with bronchitis and sent home on steroids and prednisone.  Continues to have a cough and had an episode of shortness of breath this evening.  Upon arrival to the emergency room patient is actively coughing but has clear breath sounds with good air movement, no wheezing or crackles.  She has no asymmetric leg swelling or pitting edema.  She has no fever, she has normal work of breathing.  Repeat chest x-ray with no signs of pneumonia.  Procalcitonin is negative.  Covid and flu negative.  2 high-sensitivity troponin negative therefore low suspicion for myocarditis.  Chest x-ray no signs of pericarditis.  She does have a white count which is new when compared with labs from 2 days ago however patient did receive dose of IV steroids and is on p.o. steroids which is most likely the cause.  She continues to deny any fever with a  negative procalcitonin low suspicion for bacterial pneumonia.  No significant electrolyte derangements other than mild hyperglycemia which is expected on steroids.  D-dimer was sent to since patient had no wheezing on exam and that is negative therefore low suspicion for PE.  Patient treated with 3 duo nebs, IV Solu-Medrol, IV magnesium, and given a dose of p.o. Tussionex for cough.  She was placed on telemetry for monitoring of cardiorespiratory status.  She was ambulated and desats to 89% and becomes tachypneic. No wheezing and moving good air. When she returns to the stretcher, sats improve to 93% on RA. Will consult the hospitalist team for admission.       _____________________________________________ Please note:  Patient was evaluated in Emergency Department today for the symptoms described in the history of present illness. Patient was evaluated in the context of the global COVID-19 pandemic, which necessitated consideration that the patient might be at risk for infection with the SARS-CoV-2 virus that causes COVID-19. Institutional protocols and algorithms that pertain to the evaluation of patients at risk for COVID-19 are in a state of rapid change based on information released by regulatory bodies including the CDC and federal and state organizations. These policies and algorithms were followed during the patient's care in the ED.  Some ED evaluations and interventions may be delayed as a result of limited staffing during the pandemic.   [place] Controlled Substance Database was reviewed by me. ____________________________________________   FINAL CLINICAL IMPRESSION(S) / ED DIAGNOSES   Final diagnoses:  Acute respiratory failure with hypoxia ([place])  Bronchitis      NEW MEDICATIONS STARTED DURING THIS VISIT:  ED Discharge Orders    None       Note:  This document was prepared using Dragon voice recognition software and may include unintentional dictation errors.    Alfred Levins,  [place], MD 07/17/20 [id number]

## 2020-07-17 NOTE — Progress Notes (Signed)
[employer] c/o chest pain anterior chest left of sternum. Paind duration 30-40 minutes and described as continuous ache. Pain severity 4/10 at rest and worse with coughing at 6/10. Pain is reproducible with palpation. Prior troponin non changing at 3.  Prior EKG with ST [place] changes. But significant prolonged QTc. Patient previously ordered oxycodone for pain.  Has had pleurisy in past and pain similar to what she has experienced in the past.  Allergy to NSAIDS documented. Will repeat EKG and check mag.

## 2020-07-17 NOTE — ED Triage Notes (Addendum)
Pt is a 55 y/o coming via EMS with a cc of shortness of breath and cough. Pt was seen here yesterday and dx with bronchitis and sent home on abx. Took first dose tonight. 30 minutes PTA patient had there sudden onset of difficulty breathing not relieved by two home neb tx. Hx of COPD and asthma. Lungs clear and RA sats 93-95% with MEDIC. Pt c/o R sided rib pain.

## 2020-07-17 NOTE — ED Notes (Signed)
Bed assigned. Patient aware of room assignment. Unable to give report due to shift change. Patient oxygen 90% on RA. Placed on 2L with improvement to 95%. Patient provided with remote per request.

## 2020-07-17 NOTE — ED Notes (Signed)
ED Provider at bedside. 

## 2020-07-17 NOTE — ED Notes (Signed)
Patient provided with drink per request. Oxygen titrated down and patient maintaining sats above 95% on RA. Plan for ambulatory pulse ox shortly. Pt aware.

## 2020-07-17 NOTE — H&P (Signed)
History and Physical    Jenna Tyler SWN:[id number] DOB: May 15, 1965 DOA: 07/17/2020  Referring MD/NP/PA:   PCP: [facility]   Patient coming from:  The patient is coming from home.  At baseline, pt is independent for most of ADL.        Chief Complaint: cough and SOB  HPI: Jenna Tyler is a 55 y.o. 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  Patient has been having dry cough and shortness of for more than 4 days, which has been progressively worsening.  Patient was seen in the ED yesterday, and diagnosed as bronchitis, and given prescription of Zithromax and steroid.  States that her symptoms has been worsening.  She continues to have shortness breath and cough.  She was found to have oxygen desaturation to 89% in the ED.  Patient reports severe bilateral chest wall and rib cage pain, which is constant, 10 out of 10 severity, sharp, nonradiating.  It is aggravated with coughing.  No fever or chills.  Denies nausea vomiting, diarrhea or abdominal pain.  No symptoms of UTI.  Patient states that she quit drinking alcohol 4 years ago.  ED Course: pt was found to have WBC 10.0, troponin level 3 --> 3, negative Covid PCR, D-dimer <0.27, GFR> 60, temperature normal, blood pressure 131/79, heart rate 106, 92, RR 24, 15, oxygen saturation 100% on 3 L oxygen.  Chest x-ray showed mild left basilar atelectasis and mild vascular congestion.  Patient is admitted to [place] bed as inpatient.  Review of Systems:   General: no fevers, chills, no body weight gain, has fatigue HEENT: no blurry vision, hearing changes or sore throat Respiratory: has dyspnea, coughing, no  wheezing CV: has chest wall and rib cage pain, no palpitations GI: no nausea, vomiting, abdominal pain, diarrhea, constipation GU: no dysuria, burning on urination, increased urinary frequency, hematuria  Ext: no leg edema Neuro: no unilateral weakness, numbness, or tingling, no vision change or hearing loss Skin: no rash, no skin tear. MSK: No muscle spasm, no deformity, no limitation of range of movement in spin Heme: No easy bruising.  Travel history: No recent long distant travel.  Allergy:  Allergies  Allergen Reactions  . Nsaids Other (See Comments)    Internal bleeding  Intestinal bleeding  . Adhesive [Tape] Rash  . Aspirin Other (See Comments)    Cannot take due to ulcers.  . Penicillins Rash    Has patient had a PCN reaction causing immediate rash, facial/tongue/throat swelling, SOB or lightheadedness with hypotension: Yes Has patient had a PCN reaction causing severe rash involving mucus membranes or skin necrosis: No Has patient had a PCN reaction that required hospitalization: No Has patient had a PCN reaction occurring within the last 10 years: Yes If all of the above answers are "NO", then may proceed with Cephalosporin use.  Patient tolerated ANCEF administration     Past Medical History:  Diagnosis Date  . Asthma   . Guillain Barr syndrome ([place])   . Hyperlipidemia   . Hypertension   . Hypothyroidism   . 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 Pancreatitis     Past Surgical History:  Procedure Laterality Date  . ABDOMINAL HYSTERECTOMY    . BREAST BIOPSY Right 2012   negative  .  CHOLECYSTECTOMY    . 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 OTHER SURGICAL HISTORY     excision of lipoma  . OTHER SURGICAL HISTORY     Abdominal surgery  . THYROID SURGERY     thyroidectomy  . XI ROBOTIC LAPAROSCOPIC ASSISTED APPENDECTOMY N/A 04/08/2020   Procedure: XI ROBOTIC LAPAROSCOPIC ASSISTED APPENDECTOMY;  Surgeon: Benjamine Sprague, DO;  Location: [facility] ORS;   Service: General;  Laterality: N/A;    Social History:  reports that she quit smoking about 22 years ago. She has never used smokeless tobacco. She reports previous alcohol use. She reports that she does not use drugs.  Family History:  Family History  Problem Relation Age of Onset  . Breast cancer Sister 29  . Breast cancer Maternal Aunt 68  . Breast cancer Maternal Grandmother 60  . Breast cancer Maternal Aunt 31  . Breast cancer Cousin 40       maternal side     Prior to Admission medications   Medication Sig Start Date End Date Taking? Authorizing Provider  albuterol (PROVENTIL) (2.5 MG/3ML) 0.083% nebulizer solution Inhale 3 mLs into the lungs every 6 (six) hours as needed. 06/03/20 06/03/21 Yes [provider]  amitriptyline (ELAVIL) 10 MG tablet Take 40 mg by mouth at bedtime.   Yes [provider]  gabapentin (NEURONTIN) 300 MG capsule Take 900-1,200 mg by mouth See admin instructions. Take 3 capsules (900mg ) by mouth every morning, take 3 capsules (900mg ) by mouth daily at lunchtime, take 3 capsules (900mg ) by mouth every evening and take 4 capsules (1200mg ) by mouth every night at bedtime 07/06/19  Yes [provider]  levothyroxine (SYNTHROID) 150 MCG tablet Take 150 mcg by mouth daily before breakfast.   Yes [provider]  predniSONE (DELTASONE) 20 MG tablet Take 1 tablet by mouth daily. 07/15/20 07/25/20 Yes [provider]  rOPINIRole (REQUIP) 1 MG tablet Take 1-2 mg by mouth See admin instructions. Take 1 tablet (1mg ) by mouth every morning and take 2 tablets (2mg ) by mouth every night at bedtime   Yes [provider]  venlafaxine XR (EFFEXOR-XR) 75 MG 24 hr capsule Take 75 mg by mouth daily.   Yes [provider]  azithromycin (ZITHROMAX) 500 MG tablet Take 500 mg by mouth daily. 07/15/20   [provider]  baclofen (LIORESAL) 10 MG tablet Take 10 mg by mouth daily. 07/04/20   [provider]   chlorpheniramine-HYDROcodone (TUSSIONEX) 10-8 MG/5ML SUER Take 5 mLs by mouth every 12 (twelve) hours as needed for cough. 07/16/20   [provider]  methocarbamol (ROBAXIN) 500 MG tablet Take 1.5 tablets by mouth every 8 (eight) hours as needed. 07/13/20   [provider]  oxyCODONE-acetaminophen (PERCOCET) 5-325 MG tablet Take 1 tablet by mouth every 6 (six) hours as needed for up to 10 doses for severe pain (for breakthrough pain not relieved by tylenol). Patient not taking: Reported on 07/17/2020 04/09/20   Benjamine Sprague, DO  oxyCODONE-acetaminophen (PERCOCET) 5-325 MG tablet Take 1 tablet by mouth every 6 (six) hours as needed for up to 10 doses for severe pain. Patient not taking: Reported on 07/17/2020 04/09/20   Benjamine Sprague, DO  SYMBICORT 160-4.5 MCG/ACT inhaler Inhale 2 puffs into the lungs 2 (two) times daily. 05/25/20   [provider]  tiZANidine (ZANAFLEX) 4 MG tablet Take 4 mg by mouth every 6 (six) hours as needed for muscle spasms.  Patient not taking: Reported on 07/17/2020    [provider]  Physical Exam: Vitals:   07/17/20 0440 07/17/20 0500 07/17/20 0600 07/17/20 0700  BP: (!) 136/93 118/75 131/79 140/75  Pulse: (!) 106 95 92 (!) 105  Resp: 19 18 15  (!) 21  Temp:      TempSrc:      SpO2: 99% 97% 100% 94%  Weight:      Height:       General: Not in acute distress HEENT:       Eyes: PERRL, EOMI, no scleral icterus.       ENT: No discharge from the ears and nose, no pharynx injection, no tonsillar enlargement.        Neck: No JVD, no bruit, no mass felt. Heme: No neck lymph node enlargement. Cardiac: S1/S2, RRR, No murmurs, No gallops or rubs. Respiratory: has rhonchi bilaterally.  Has tenderness in bilateral chest wall and rib cage laterally GI: Soft, nondistended, nontender, no rebound pain, no organomegaly, BS present.  GU: No hematuria Ext: No pitting leg edema bilaterally. 1+DP/PT pulse bilaterally. Musculoskeletal: No joint  deformities, No joint redness or warmth, no limitation of ROM in spin. Skin: No rashes.  Neuro: Alert, oriented X3, cranial nerves II-XII grossly intact, moves all extremities normally. Psych: Patient is not psychotic, no suicidal or hemocidal ideation.  Labs on Admission: I have personally reviewed following labs and imaging studies  CBC: Recent Labs  Lab 07/15/20 1606 07/17/20 0202  WBC 9.9 16.0*  NEUTROABS 6.6 10.8*  HGB 15.4* 15.0  HCT 45.2 45.3  MCV 80.7 82.4  PLT 338 938   Basic Metabolic Panel: Recent Labs  Lab 07/15/20 1606 07/17/20 0202  NA 136 134*  K 3.9 3.9  CL 108 105  CO2 20* 17*  GLUCOSE 111* 181*  BUN 15 25*  CREATININE 0.86 1.09*  CALCIUM 9.4 9.0   GFR: Estimated Creatinine Clearance: 62 mL/min (A) (by C-G formula based on SCr of 1.09 mg/dL (H)). Liver Function Tests: No results for input(s): AST, ALT, ALKPHOS, BILITOT, PROT, ALBUMIN in the last 168 hours. No results for input(s): LIPASE, AMYLASE in the last 168 hours. No results for input(s): AMMONIA in the last 168 hours. Coagulation Profile: No results for input(s): INR, PROTIME in the last 168 hours. Cardiac Enzymes: No results for input(s): CKTOTAL, CKMB, CKMBINDEX, TROPONINI in the last 168 hours. BNP (last 3 results) No results for input(s): PROBNP in the last 8760 hours. HbA1C: No results for input(s): HGBA1C in the last 72 hours. CBG: No results for input(s): GLUCAP in the last 168 hours. Lipid Profile: No results for input(s): CHOL, HDL, LDLCALC, TRIG, CHOLHDL, LDLDIRECT in the last 72 hours. Thyroid Function Tests: No results for input(s): TSH, T4TOTAL, FREET4, T3FREE, THYROIDAB in the last 72 hours. Anemia Panel: No results for input(s): VITAMINB12, FOLATE, FERRITIN, TIBC, IRON, RETICCTPCT in the last 72 hours. Urine analysis:    Component Value Date/Time   COLORURINE YELLOW (A) 04/07/2020 0859   APPEARANCEUR HAZY (A) 04/07/2020 0859   APPEARANCEUR Clear 03/28/2014 1540    LABSPEC 1.039 (H) 04/07/2020 0859   LABSPEC 1.021 03/28/2014 1540   PHURINE 5.0 04/07/2020 0859   GLUCOSEU NEGATIVE 04/07/2020 0859   GLUCOSEU Negative 03/28/2014 1540   HGBUR NEGATIVE 04/07/2020 0859   BILIRUBINUR SMALL (A) 04/07/2020 0859   BILIRUBINUR Negative 03/28/2014 1540   KETONESUR NEGATIVE 04/07/2020 0859   PROTEINUR 30 (A) 04/07/2020 0859   NITRITE NEGATIVE 04/07/2020 0859   LEUKOCYTESUR SMALL (A) 04/07/2020 0859   LEUKOCYTESUR 1+ 03/28/2014 1540   Sepsis Labs: @LABRCNTIP (procalcitonin:4,lacticidven:4) ) Recent  Results (from the past 240 hour(s))  Resp Panel by RT-PCR (Flu A&B, Covid) Nasopharyngeal Swab     Status: None   Collection Time: 07/17/20  2:33 AM   Specimen: Nasopharyngeal Swab; Nasopharyngeal(NP) swabs in vial transport medium  Result Value Ref Range Status   SARS Coronavirus 2 by RT PCR NEGATIVE NEGATIVE Final    Comment: (NOTE) SARS-CoV-2 target nucleic acids are NOT DETECTED.  The SARS-CoV-2 RNA is generally detectable in upper respiratory specimens during the acute phase of infection. The lowest concentration of SARS-CoV-2 viral copies this assay can detect is 138 copies/mL. A negative result does not preclude SARS-Cov-2 infection and should not be used as the sole basis for treatment or other patient management decisions. A negative result may occur with  improper specimen collection/handling, submission of specimen other than nasopharyngeal swab, presence of viral mutation(s) within the areas targeted by this assay, and inadequate number of viral copies(<138 copies/mL). A negative result must be combined with clinical observations, patient history, and epidemiological information. The expected result is Negative.  Fact Sheet for Patients:  EntrepreneurPulse.com.au  Fact Sheet for Healthcare Providers:  IncredibleEmployment.be  This test is no t yet approved or cleared by the [place] FDA and  has been  authorized for detection and/or diagnosis of SARS-CoV-2 by FDA under an Emergency Use Authorization (EUA). This EUA will remain  in effect (meaning this test can be used) for the duration of the COVID-19 declaration under Section 564(b)(1) of the Act, 21 U.S.C.section 360bbb-3(b)(1), unless the authorization is terminated  or revoked sooner.       Influenza A by PCR NEGATIVE NEGATIVE Final   Influenza B by PCR NEGATIVE NEGATIVE Final    Comment: (NOTE) The Xpert Xpress SARS-CoV-2/FLU/RSV plus assay is intended as an aid in the diagnosis of influenza from Nasopharyngeal swab specimens and should not be used as a sole basis for treatment. Nasal washings and aspirates are unacceptable for Xpert Xpress SARS-CoV-2/FLU/RSV testing.  Fact Sheet for Patients: EntrepreneurPulse.com.au  Fact Sheet for Healthcare Providers: IncredibleEmployment.be  This test is not yet approved or cleared by the [place] FDA and has been authorized for detection and/or diagnosis of SARS-CoV-2 by FDA under an Emergency Use Authorization (EUA). This EUA will remain in effect (meaning this test can be used) for the duration of the COVID-19 declaration under Section 564(b)(1) of the Act, 21 U.S.C. section 360bbb-3(b)(1), unless the authorization is terminated or revoked.  Performed at [facility], [address] [place], [place] [address]      Radiological Exams on Admission: DG Chest 2 View  Result Date: 07/15/2020 CLINICAL DATA:  Cough and shortness of breath over the last few days. EXAM: CHEST - 2 VIEW COMPARISON:  07/09/2019.  07/11/2017. FINDINGS: Heart size is normal. Mediastinal shadows are normal. There is a bronchial thickening pattern suggesting bronchitis. No infiltrate, collapse or effusion. No significant bone finding. IMPRESSION: Bronchitis pattern. No consolidation or collapse. Electronically Signed   By: Nelson Chimes M.D.   On: 07/15/2020  16:28   DG Chest Portable 1 View  Result Date: 07/17/2020 CLINICAL DATA:  Shortness of breath and cough EXAM: PORTABLE CHEST 1 VIEW COMPARISON:  07/15/2020 FINDINGS: Cardiac shadow is mildly prominent but accentuated by the portable technique. Left basilar atelectasis is noted new from the prior study. Mild vascular congestion is noted. No bony abnormality is noted. IMPRESSION: Mild left basilar atelectasis. Mild vascular congestion. Electronically Signed   By: Inez Catalina M.D.   On: 07/17/2020 03:53  EKG: I have personally reviewed.  Sinus rhythm, QTC 540, LAE, poor R wave progression.  Nonspecific T wave change  Assessment/Plan Principal Problem:   COPD exacerbation (HCC) Active Problems:   HTN (hypertension)   Hypothyroidism, unspecified   Depression with anxiety   Acute respiratory failure with hypoxia (HCC)   Chest wall pain   Acute respiratory failure with hypoxia due to COPD exacerbation [facility]): Patient has rhonchi on auscultation, indicating COPD exacerbation. chest x-ray has no infiltration.  Covid PCR negative.  D-dimer negative, less likely to have PE.  Patient was given 2 g of magnesium sulfate, 125 mg Solu-Medrol and bronchodilators in ED. Patient had oxygen desaturation to 89% in ED, currently improving.  Patient does not have fever, does not seem to have sepsis clinically.  She has leukocytosis with WBC 16.0 which is likely due to steroid use.  - will admit to Med-surg bed as inpatient -Bronchodilators -Solu-Medrol 40 mg IV bid -Doxycycline 100 mg twice daily -Mucinex for cough  -Incentive spirometry -Follow up blood culture x2, sputum culture -Nasal cannula oxygen as needed to maintain O2 saturation 93% or greater  HTN (hypertension): Patient's not taking medications currently.  Blood pressure 131/79 -prn hydralazine  Hypothyroidism, unspecified -Synthroid  Depression with anxiety -Continue home medications  Chest wall pain/rib cage pain: Etiology is not  clear.  Possibly due to musculoskeletal pain.  Chest x-ray did not show rib fracture. -As needed Tylenol and Percocet    DVT ppx: SQ Lovenox Code Status: Full code Family Communication: not done, no family member is at bed side.   Disposition Plan:  Anticipate discharge back to previous environment Consults called:  none Admission status and Level of care: Med-Surg:    as inpt          Status is: Inpatient  Remains inpatient appropriate because:Inpatient level of care appropriate due to severity of illness   Dispo: The patient is from: Home              Anticipated d/c is to: Home              Patient currently is not medically stable to d/c.   Difficult to place patient No          Date of Service 07/17/2020    [facility]   If 7PM-7AM, please contact night-coverage [email or web address] 07/17/2020, 8:34 AM

## 2020-07-17 NOTE — ED Notes (Signed)
Report messaged to inpatient Rn awaiting response.

## 2020-07-17 NOTE — ED Notes (Signed)
X-ray at bedside

## 2020-07-17 NOTE — Plan of Care (Signed)

## 2020-07-18 ENCOUNTER — Inpatient Hospital Stay: Payer: Medicaid Other

## 2020-07-18 DIAGNOSIS — J441 Chronic obstructive pulmonary disease with (acute) exacerbation: Secondary | ICD-10-CM | POA: Diagnosis not present

## 2020-07-18 LAB — CBC
HCT: 41.4 % (ref 36.0–46.0)
Hemoglobin: 13.8 g/dL (ref 12.0–15.0)
MCH: 27.4 pg (ref 26.0–34.0)
MCHC: 33.3 g/dL (ref 30.0–36.0)
MCV: 82.3 fL (ref 80.0–100.0)
Platelets: 321 10*3/uL (ref 150–400)
RBC: 5.03 MIL/uL (ref 3.87–5.11)
RDW: 14.5 % (ref 11.5–15.5)
WBC: 17.7 10*3/uL — ABNORMAL HIGH (ref 4.0–10.5)
nRBC: 0 % (ref 0.0–0.2)

## 2020-07-18 LAB — BASIC METABOLIC PANEL
Anion gap: 9 (ref 5–15)
BUN: 20 mg/dL (ref 6–20)
CO2: 23 mmol/L (ref 22–32)
Calcium: 9.2 mg/dL (ref 8.9–10.3)
Chloride: 104 mmol/L (ref 98–111)
Creatinine, Ser: 0.86 mg/dL (ref 0.44–1.00)
GFR, Estimated: >60 mL/min (ref >60)
Glucose, Bld: 141 mg/dL — ABNORMAL HIGH (ref 70–99)
Potassium: 4.2 mmol/L (ref 3.5–5.1)
Sodium: 136 mmol/L (ref 135–145)

## 2020-07-18 MED ORDER — HYDROCOD POLST-CPM POLST ER 10-8 MG/5ML PO SUER
5.0000 mL | Freq: Two times a day (BID) | ORAL | Status: DC
  Administered 2020-07-18: 5 mL via ORAL
  Filled 2020-07-18: qty 5

## 2020-07-18 MED ORDER — GABAPENTIN 400 MG PO CAPS
800.0000 mg | ORAL_CAPSULE | Freq: Every day | ORAL | Status: DC
Start: 2020-07-18 — End: 2020-07-19
  Administered 2020-07-18: 800 mg via ORAL
  Filled 2020-07-18: qty 2

## 2020-07-18 MED ORDER — IPRATROPIUM-ALBUTEROL 0.5-2.5 (3) MG/3ML IN SOLN
3.0000 mL | Freq: Three times a day (TID) | RESPIRATORY_TRACT | Status: DC
  Administered 2020-07-18: 3 mL via RESPIRATORY_TRACT
  Filled 2020-07-18: qty 3

## 2020-07-18 MED ORDER — GABAPENTIN 400 MG PO CAPS
800.0000 mg | ORAL_CAPSULE | Freq: Every day | ORAL | Status: DC
  Administered 2020-07-18: 800 mg via ORAL
  Filled 2020-07-18: qty 2

## 2020-07-18 MED ORDER — METHYLPREDNISOLONE SODIUM SUCC 40 MG IJ SOLR
40.0000 mg | Freq: Three times a day (TID) | INTRAMUSCULAR | Status: DC
  Administered 2020-07-18 – 2020-07-19 (×3): 40 mg via INTRAVENOUS
  Filled 2020-07-18 (×3): qty 1

## 2020-07-18 MED ORDER — METHOCARBAMOL 500 MG PO TABS
750.0000 mg | ORAL_TABLET | Freq: Two times a day (BID) | ORAL | Status: DC
  Administered 2020-07-18 (×2): 750 mg via ORAL
  Filled 2020-07-18 (×2): qty 2

## 2020-07-18 MED ORDER — GABAPENTIN 400 MG PO CAPS: 1200.0000 mg | ORAL_CAPSULE | Freq: Every evening | ORAL | Status: DC | PRN

## 2020-07-18 MED ORDER — MENTHOL 3 MG MT LOZG
1.0000 | LOZENGE | OROMUCOSAL | Status: DC | PRN
  Administered 2020-07-18: 3 mg via ORAL
  Filled 2020-07-18: qty 9

## 2020-07-18 MED ORDER — BENZONATATE 100 MG PO CAPS
200.0000 mg | ORAL_CAPSULE | Freq: Three times a day (TID) | ORAL | Status: DC
  Administered 2020-07-18 – 2020-07-19 (×3): 200 mg via ORAL
  Filled 2020-07-18 (×3): qty 2

## 2020-07-18 MED ORDER — IPRATROPIUM-ALBUTEROL 0.5-2.5 (3) MG/3ML IN SOLN
3.0000 mL | Freq: Four times a day (QID) | RESPIRATORY_TRACT | Status: DC
  Administered 2020-07-18 – 2020-07-19 (×3): 3 mL via RESPIRATORY_TRACT
  Filled 2020-07-18 (×4): qty 3

## 2020-07-18 MED ORDER — OXYCODONE-ACETAMINOPHEN 5-325 MG PO TABS
1.0000 | ORAL_TABLET | ORAL | Status: DC | PRN
  Administered 2020-07-18 – 2020-07-19 (×4): 1 via ORAL
  Filled 2020-07-18 (×4): qty 1

## 2020-07-18 MED ORDER — GABAPENTIN 400 MG PO CAPS
800.0000 mg | ORAL_CAPSULE | Freq: Every morning | ORAL | Status: DC
  Administered 2020-07-19: 800 mg via ORAL
  Filled 2020-07-18: qty 2

## 2020-07-18 MED ORDER — ACETAMINOPHEN 325 MG PO TABS
650.0000 mg | ORAL_TABLET | Freq: Four times a day (QID) | ORAL | Status: DC | PRN
  Filled 2020-07-18: qty 2

## 2020-07-18 MED ORDER — ARFORMOTEROL TARTRATE 15 MCG/2ML IN NEBU
15.0000 ug | INHALATION_SOLUTION | Freq: Two times a day (BID) | RESPIRATORY_TRACT | Status: DC
  Administered 2020-07-18 – 2020-07-19 (×3): 15 ug via RESPIRATORY_TRACT
  Filled 2020-07-18 (×5): qty 2

## 2020-07-18 MED ORDER — BUDESONIDE 0.25 MG/2ML IN SUSP
0.2500 mg | Freq: Two times a day (BID) | RESPIRATORY_TRACT | Status: DC
  Administered 2020-07-18 – 2020-07-19 (×3): 0.25 mg via RESPIRATORY_TRACT
  Filled 2020-07-18 (×3): qty 2

## 2020-07-18 MED ORDER — GUAIFENESIN-DM 100-10 MG/5ML PO SYRP: 5.0000 mL | ORAL_SOLUTION | ORAL | Status: DC | PRN

## 2020-07-18 NOTE — Progress Notes (Signed)
PROGRESS NOTE    Jenna Tyler  AQT:[id number] DOB: 1965/12/22 DOA: 07/17/2020 PCP: [facility]  Brief Narrative:  55 y.o. 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  Patient has been having dry cough and shortness of for more than 4 days, which has been progressively worsening.  Patient was seen in the ED yesterday, and diagnosed as bronchitis, and given prescription of Zithromax and steroid.  States that her symptoms has been worsening.  She continues to have shortness breath and cough.  She was found to have oxygen desaturation to 89% in the ED.  Respiratory status improving since admission.  Started on IV steroids and bronchodilator therapy.  Still with significant cough and chest wall pain.  Air movement improved but not quite at baseline.  Remains dependent on 2 L nasal cannula.   Assessment & Plan:   Principal Problem:   COPD exacerbation ([place]) Active Problems:   HTN (hypertension)   Hypothyroidism, unspecified   Depression with anxiety   Acute respiratory failure with hypoxia ([facility])   Chest wall pain  Acute respiratory failure with hypoxia due to COPD exacerbation ([place]):  Notable rhonchi and decreased air movement on exam Chest x-ray without infiltration Covid negative D-dimer negative Status post mag sulfate, Solu-Medrol, bronchodilators in ED Oxygen desaturation 89% in ED Plan: Continue IV steroids Solu-Medrol 40 every 8 Aggressive bronchodilator therapy Supplemental oxygen, wean as tolerated As needed antitussives and Mucinex Stress incentive spirometry use Possible discharge in 24 hours if respiratory status returned to baseline  HTN  (hypertension) Patient's not taking medications currently.   prn hydralazine  Hypothyroidism Synthroid  Depression with anxiety Continue home medications  Chest wall pain/rib cage pain Etiology is not clear.   Possibly due to musculoskeletal pain.   No rib fractures on chest x-ray Plan: CT thorax without contrast for better visualization   DVT prophylaxis: SQ Lovenox Code Status: Full Family Communication: None today Disposition Plan: Status is: Inpatient  Remains inpatient appropriate because:Inpatient level of care appropriate due to severity of illness   Dispo: The patient is from: Home              Anticipated d/c is to: Home              Patient currently is not medically stable to d/c.   Difficult to place patient No   Acute decompensated COPD with concomitant acute hypoxic respiratory failure.  Symptoms improving.  Hopeful to discharge in 24 hours.    Level of care: Med-Surg  Consultants:   None  Procedures:   None  Antimicrobials:   None   Subjective: Patient seen and examined.  Reports improvement in respiratory status since admission but not at baseline.  Complains of chest wall rib pain, greater on right  Objective: Vitals:   07/17/20 2057 07/18/20 0547 07/18/20 0737 07/18/20 1127  BP:  126/76 138/84 128/74  Pulse:  80 75 90  Resp:  16 18 20   Temp:  97.7 F (36.5 C) 98.2 F (36.8 C) 98.1 F (36.7 C)  TempSrc:  Oral  Oral  SpO2: 96% 98% 96% 95%  Weight:      Height:       No intake or output data in the 24 hours ending 07/18/20 1210 Filed Weights   07/17/20  0204 07/17/20 0909  Weight: 81 kg 81 kg    Examination:  General exam: Appears calm and comfortable  Respiratory system: Bilateral rhonchi.  Normal work of breathing.  Decreased air movement.  2 L Cardiovascular system: S1-S2 heard, regular rate and rhythm, no murmurs, chest wall tenderness, no pedal edema Gastrointestinal system: Abdomen is nondistended, soft and  nontender. No organomegaly or masses felt. Normal bowel sounds heard. Central nervous system: Alert and oriented. No focal neurological deficits. Extremities: Symmetric 5 x 5 power. Skin: No rashes, lesions or ulcers Psychiatry: Judgement and insight appear normal. Mood & affect appropriate.     Data Reviewed: I have personally reviewed following labs and imaging studies  CBC: Recent Labs  Lab 07/15/20 1606 07/17/20 0202 07/18/20 0404  WBC 9.9 16.0* 17.7*  NEUTROABS 6.6 10.8*  --   HGB 15.4* 15.0 13.8  HCT 45.2 45.3 41.4  MCV 80.7 82.4 82.3  PLT 338 367 932   Basic Metabolic Panel: Recent Labs  Lab 07/15/20 1606 07/17/20 0202 07/17/20 2035 07/18/20 0404  NA 136 134*  --  136  K 3.9 3.9  --  4.2  CL 108 105  --  104  CO2 20* 17*  --  23  GLUCOSE 111* 181*  --  141*  BUN 15 25*  --  20  CREATININE 0.86 1.09*  --  0.86  CALCIUM 9.4 9.0  --  9.2  MG  --   --  2.1  --    GFR: Estimated Creatinine Clearance: 78.6 mL/min (by C-G formula based on SCr of 0.86 mg/dL). Liver Function Tests: No results for input(s): AST, ALT, ALKPHOS, BILITOT, PROT, ALBUMIN in the last 168 hours. No results for input(s): LIPASE, AMYLASE in the last 168 hours. No results for input(s): AMMONIA in the last 168 hours. Coagulation Profile: No results for input(s): INR, PROTIME in the last 168 hours. Cardiac Enzymes: No results for input(s): CKTOTAL, CKMB, CKMBINDEX, TROPONINI in the last 168 hours. BNP (last 3 results) No results for input(s): PROBNP in the last 8760 hours. HbA1C: No results for input(s): HGBA1C in the last 72 hours. CBG: No results for input(s): GLUCAP in the last 168 hours. Lipid Profile: No results for input(s): CHOL, HDL, LDLCALC, TRIG, CHOLHDL, LDLDIRECT in the last 72 hours. Thyroid Function Tests: No results for input(s): TSH, T4TOTAL, FREET4, T3FREE, THYROIDAB in the last 72 hours. Anemia Panel: No results for input(s): VITAMINB12, FOLATE, FERRITIN, TIBC, IRON,  RETICCTPCT in the last 72 hours. Sepsis Labs: Recent Labs  Lab 07/17/20 0202  PROCALCITON <0.10    Recent Results (from the past 240 hour(s))  Resp Panel by RT-PCR (Flu A&B, Covid) Nasopharyngeal Swab     Status: None   Collection Time: 07/17/20  2:33 AM   Specimen: Nasopharyngeal Swab; Nasopharyngeal(NP) swabs in vial transport medium  Result Value Ref Range Status   SARS Coronavirus 2 by RT PCR NEGATIVE NEGATIVE Final    Comment: (NOTE) SARS-CoV-2 target nucleic acids are NOT DETECTED.  The SARS-CoV-2 RNA is generally detectable in upper respiratory specimens during the acute phase of infection. The lowest concentration of SARS-CoV-2 viral copies this assay can detect is 138 copies/mL. A negative result does not preclude SARS-Cov-2 infection and should not be used as the sole basis for treatment or other patient management decisions. A negative result may occur with  improper specimen collection/handling, submission of specimen other than nasopharyngeal swab, presence of viral mutation(s) within the areas targeted by this assay, and inadequate number of viral  copies(<138 copies/mL). A negative result must be combined with clinical observations, patient history, and epidemiological information. The expected result is Negative.  Fact Sheet for Patients:  EntrepreneurPulse.com.au  Fact Sheet for Healthcare Providers:  IncredibleEmployment.be  This test is no t yet approved or cleared by the [place] FDA and  has been authorized for detection and/or diagnosis of SARS-CoV-2 by FDA under an Emergency Use Authorization (EUA). This EUA will remain  in effect (meaning this test can be used) for the duration of the COVID-19 declaration under Section 564(b)(1) of the Act, 21 U.S.C.section 360bbb-3(b)(1), unless the authorization is terminated  or revoked sooner.       Influenza A by PCR NEGATIVE NEGATIVE Final   Influenza B by PCR NEGATIVE  NEGATIVE Final    Comment: (NOTE) The Xpert Xpress SARS-CoV-2/FLU/RSV plus assay is intended as an aid in the diagnosis of influenza from Nasopharyngeal swab specimens and should not be used as a sole basis for treatment. Nasal washings and aspirates are unacceptable for Xpert Xpress SARS-CoV-2/FLU/RSV testing.  Fact Sheet for Patients: EntrepreneurPulse.com.au  Fact Sheet for Healthcare Providers: IncredibleEmployment.be  This test is not yet approved or cleared by the [place] FDA and has been authorized for detection and/or diagnosis of SARS-CoV-2 by FDA under an Emergency Use Authorization (EUA). This EUA will remain in effect (meaning this test can be used) for the duration of the COVID-19 declaration under Section 564(b)(1) of the Act, 21 U.S.C. section 360bbb-3(b)(1), unless the authorization is terminated or revoked.  Performed at [facility], [address] [place], [place] [address]   Culture, blood (routine x 2) Call MD if unable to obtain prior to antibiotics being given     Status: None (Preliminary result)   Collection Time: 07/17/20  9:39 AM   Specimen: BLOOD  Result Value Ref Range Status   Specimen Description BLOOD RIGHT ARM  Final   Special Requests   Final    BOTTLES DRAWN AEROBIC AND ANAEROBIC Blood Culture adequate volume   Culture   Final    NO GROWTH < 24 HOURS Performed at [facility], [address], [place], [place] [address]    Report Status PENDING  Incomplete  Culture, blood (routine x 2) Call MD if unable to obtain prior to antibiotics being given     Status: None (Preliminary result)   Collection Time: 07/17/20  9:39 AM   Specimen: BLOOD  Result Value Ref Range Status   Specimen Description BLOOD LEFT ARM  Final   Special Requests   Final    BOTTLES DRAWN AEROBIC AND ANAEROBIC Blood Culture adequate volume   Culture   Final    NO GROWTH < 24 HOURS Performed at [facility], [address] [place], [place] [address]    Report Status PENDING  Incomplete         Radiology Studies: DG Chest Portable 1 View  Result Date: 07/17/2020 CLINICAL DATA:  Shortness of breath and cough EXAM: PORTABLE CHEST 1 VIEW COMPARISON:  07/15/2020 FINDINGS: Cardiac shadow is mildly prominent but accentuated by the portable technique. Left basilar atelectasis is noted new from the prior study. Mild vascular congestion is noted. No bony abnormality is noted. IMPRESSION: Mild left basilar atelectasis. Mild vascular congestion. Electronically Signed   By: Inez Catalina M.D.   On: 07/17/2020 03:53        Scheduled Meds: . amitriptyline  40 mg Oral QHS  . arformoterol  15 mcg Nebulization BID  . baclofen  10 mg Oral Daily  . budesonide (PULMICORT) nebulizer solution  0.25 mg Nebulization BID  . enoxaparin (LOVENOX) injection  40 mg Subcutaneous Q24H  . folic acid  1 mg Oral Daily  . [START ON 07/19/2020] gabapentin  800 mg Oral q AM   And  . gabapentin  800 mg Oral Q lunch   And  . gabapentin  800 mg Oral Q supper  . ipratropium-albuterol  3 mL Nebulization Q6H  . levothyroxine  150 mcg Oral QAC breakfast  . methocarbamol  750 mg Oral BID  . methylPREDNISolone (SOLU-MEDROL) injection  40 mg Intravenous Q8H  . multivitamin with minerals  1 tablet Oral Daily  . rOPINIRole  1 mg Oral q AM   And  . rOPINIRole  2 mg Oral QHS  . thiamine  100 mg Oral Daily   Or  . thiamine  100 mg Intravenous Daily  . venlafaxine XR  75 mg Oral Daily   Continuous Infusions:   LOS: 1 day    Time spent: 25 minutes    Sidney Ace, MD [facility] Pager [phone]  If 7PM-7AM, please contact night-coverage 07/18/2020, 12:10 PM

## 2020-07-19 DIAGNOSIS — J441 Chronic obstructive pulmonary disease with (acute) exacerbation: Secondary | ICD-10-CM | POA: Diagnosis not present

## 2020-07-19 MED ORDER — HYDROCOD POLST-CPM POLST ER 10-8 MG/5ML PO SUER: 5.0000 mL | Freq: Two times a day (BID) | ORAL | 0 refills | Status: AC | PRN

## 2020-07-19 MED ORDER — BENZONATATE 200 MG PO CAPS: 200.0000 mg | ORAL_CAPSULE | Freq: Three times a day (TID) | ORAL | 0 refills | Status: DC

## 2020-07-19 MED ORDER — PREDNISONE 20 MG PO TABS: 40.0000 mg | ORAL_TABLET | Freq: Every day | ORAL | 0 refills | Status: AC

## 2020-07-19 NOTE — Plan of Care (Signed)
Alert, oriented x 4, no c/o SOB, on room air now with O2 saturation around 95%, cough improved, no c/o coughing fit.

## 2020-07-19 NOTE — Discharge Summary (Signed)
Physician Discharge Summary  Jenna Tyler WUX:[id number] DOB: [birth year] DOA: 07/17/2020  PCP: [facility] date: 07/17/2020 Discharge date: 07/19/2020  Admitted From: Home Disposition: Home  Recommendations for Outpatient Follow-up:  1. Follow up with PCP in 1-2 weeks 2. 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  Patient has been havingdrycough and shortness of for more than 4 days, which has been progressively worsening. Patient was seen in the ED yesterday, and diagnosed asbronchitis,andgiven prescription of Zithromax and steroid. States that her symptoms has been worsening. Shecontinues to have shortness breath and cough.Shewas found to have oxygen desaturation to 89% in the ED.  Respiratory status improving since admission.  Started on IV steroids and bronchodilator therapy.  Still with significant cough and chest wall pain.  Air movement improved but not quite at baseline.  Remains dependent on 2 L nasal cannula.  On day of discharge patient has been weaned to room air.  Ambulated around the nursing unit without desaturation.  Medically stable for discharge home at this time.  Will recommend prednisone 40 mg a day x5 days following discharge.  As needed cough suppression also prescribed on discharge.  Patient will follow up with PCP in 1 to 2 weeks.  Also recommended to follow-up with her established pulmonologist within 2  weeks.  Discharge Diagnoses:  Principal Problem:   COPD exacerbation ([place]) Active Problems:   HTN (hypertension)   Hypothyroidism, unspecified   Depression with anxiety   Acute respiratory failure with hypoxia (H[facility])   Chest wall pain  Acute respiratory failure with hypoxiadue toCOPD exacerbation ([place]): Notable rhonchi and decreased air movement on exam Chest x-ray without infiltration  Covid negative D-dimer negative Status post mag sulfate, Solu-Medrol, bronchodilators in ED Oxygen desaturation 89% in ED Plan: Continue IV steroids Solu-Medrol 40 every 8 Aggressive bronchodilator therapy Supplemental oxygen, wean as tolerated As needed antitussives and Mucinex Stress incentive spirometry use Possible discharge in 24 hours if respiratory status returned to baseline  HTN (hypertension) Patient's not taking medications currently.   Hypothyroidism Synthroid  Depression with anxiety Continue home medications  Chest wall pain/rib cage pain Etiology is not clear.  Possibly due to musculoskeletal pain.  No rib fractures on chest x-ray Follow-up chest CT no bony abnormalities Recommend outpatient follow-up with PM&R or sports medicine  Discharge Instructions  Discharge Instructions    Diet - low sodium heart healthy   Complete by: As directed    Increase activity slowly   Complete by: As directed      Allergies as of 07/19/2020      Reactions   Nsaids Other (See Comments)   Internal bleeding  Intestinal bleeding   Adhesive [tape] Rash   Aspirin Other (See Comments)   Cannot take due to ulcers.   Penicillins Rash   Has patient had a PCN reaction causing immediate rash, facial/tongue/throat swelling, SOB or lightheadedness with hypotension: Yes Has patient had a PCN reaction causing severe rash involving mucus membranes or skin necrosis: No Has patient had a PCN reaction that required hospitalization: No Has patient had a PCN reaction occurring within the  last 10 years: Yes If all  of the above answers are "NO", then may proceed with Cephalosporin use. Patient tolerated ANCEF administration       Medication List    STOP taking these medications   oxyCODONE-acetaminophen 5-325 MG tablet Commonly known as: Percocet   tiZANidine 4 MG tablet Commonly known as: ZANAFLEX     TAKE these medications   albuterol (2.5 MG/3ML) 0.083% nebulizer solution Commonly known as: PROVENTIL Inhale 3 mLs into the lungs every 6 (six) hours as needed. Notes to patient: Not given in hospital   amitriptyline 10 MG tablet Commonly known as: ELAVIL Take 40 mg by mouth at bedtime.   azithromycin 500 MG tablet Commonly known as: ZITHROMAX Take 500 mg by mouth daily. Notes to patient: Not given this hospital visit   baclofen 10 MG tablet Commonly known as: LIORESAL Take 10 mg by mouth daily.   benzonatate 200 MG capsule Commonly known as: TESSALON Take 1 capsule (200 mg total) by mouth 3 (three) times daily.   chlorpheniramine-HYDROcodone 10-8 MG/5ML Suer Commonly known as: TUSSIONEX Take 5 mLs by mouth every 12 (twelve) hours as needed for up to 7 days for cough.   gabapentin 300 MG capsule Commonly known as: NEURONTIN Take 900-1,200 mg by mouth See admin instructions. Take 3 capsules (900mg ) by mouth every morning, take 3 capsules (900mg ) by mouth daily at lunchtime, take 3 capsules (900mg ) by mouth every evening and take 4 capsules (1200mg ) by mouth every night at bedtime   levothyroxine 150 MCG tablet Commonly known as: SYNTHROID Take 150 mcg by mouth daily before breakfast.   methocarbamol 500 MG tablet Commonly known as: ROBAXIN Take 1.5 tablets by mouth every 8 (eight) hours as needed.   predniSONE 20 MG tablet Commonly known as: DELTASONE Take 2 tablets (40 mg total) by mouth daily for 5 days. Start taking on: July 20, 2020 What changed: how much to take   rOPINIRole 1 MG tablet Commonly known as: REQUIP Take 1-2 mg by mouth See  admin instructions. Take 1 tablet (1mg ) by mouth every morning and take 2 tablets (2mg ) by mouth every night at bedtime   Symbicort 160-4.5 MCG/ACT inhaler Generic drug: budesonide-formoterol Inhale 2 puffs into the lungs 2 (two) times daily.   venlafaxine XR 75 MG 24 hr capsule Commonly known as: EFFEXOR-XR Take 75 mg by mouth daily.       Allergies  Allergen Reactions  . Nsaids Other (See Comments)    Internal bleeding  Intestinal bleeding  . Adhesive [Tape] Rash  . Aspirin Other (See Comments)    Cannot take due to ulcers.  . Penicillins Rash    Has patient had a PCN reaction causing immediate rash, facial/tongue/throat swelling, SOB or lightheadedness with hypotension: Yes Has patient had a PCN reaction causing severe rash involving mucus membranes or skin necrosis: No Has patient had a PCN reaction that required hospitalization: No Has patient had a PCN reaction occurring within the last 10 years: Yes If all of the above answers are "NO", then may proceed with Cephalosporin use.  Patient tolerated ANCEF administration     Consultations: None  Procedures/Studies: DG Chest 2 View  Result Date: 07/15/2020 CLINICAL DATA:  Cough and shortness of breath over the last few days. EXAM: CHEST - 2 VIEW COMPARISON:  07/09/2019.  07/11/2017. FINDINGS: Heart size is normal. Mediastinal shadows are normal. There is a bronchial thickening pattern suggesting bronchitis. No infiltrate, collapse or effusion. No significant bone finding. IMPRESSION: Bronchitis pattern. No consolidation or collapse. Electronically Signed  By: Nelson Chimes M.D.   On: 07/15/2020 16:28   CT CHEST WO CONTRAST  Result Date: 07/18/2020 CLINICAL DATA:  COPD exacerbation EXAM: CT CHEST WITHOUT CONTRAST TECHNIQUE: Multidetector CT imaging of the chest was performed following the standard protocol without IV contrast. COMPARISON:  CT abdomen pelvis, 04/12/2020, 09/05/2019 FINDINGS: Cardiovascular: No significant  vascular findings. Normal heart size. Enlargement of the main pulmonary artery measuring up to 3.7 cm in caliber. No pericardial effusion. Mediastinum/Nodes: No enlarged mediastinal, hilar, or axillary lymph nodes. Thyroid gland, trachea, and esophagus demonstrate no significant findings. Lungs/Pleura: Dependent bibasilar partial atelectasis and/or scarring, generally similar to appearance as included on lung bases on prior CT examinations (series 4, image 91). No pleural effusion or pneumothorax. Upper Abdomen: No acute abnormality. Musculoskeletal: No chest wall mass or suspicious bone lesions identified. Mild pectus deformity of the chest wall. IMPRESSION: 1. Dependent bibasilar partial atelectasis and/or scarring, generally similar to appearance as included on lung bases on prior CT examinations. No non dependent abnormality or characteristics suggestive of fibrotic interstitial lung disease. No acute appearing airspace disease. 2. Enlargement of the main pulmonary artery measuring up to 3.7 cm in caliber, as can be seen in pulmonary hypertension. 3. Mild pectus deformity of the chest wall. Electronically Signed   By: Eddie Candle M.D.   On: 07/18/2020 14:45   DG Chest Portable 1 View  Result Date: 07/17/2020 CLINICAL DATA:  Shortness of breath and cough EXAM: PORTABLE CHEST 1 VIEW COMPARISON:  07/15/2020 FINDINGS: Cardiac shadow is mildly prominent but accentuated by the portable technique. Left basilar atelectasis is noted new from the prior study. Mild vascular congestion is noted. No bony abnormality is noted. IMPRESSION: Mild left basilar atelectasis. Mild vascular congestion. Electronically Signed   By: Inez Catalina M.D.   On: 07/17/2020 03:53    (Echo, Carotid, EGD, Colonoscopy, ERCP)    Subjective: Seen and examined on the day of discharge.  Stable, no distress.  Ambulated around unit without desaturation.  Stable for discharge  Discharge Exam: Vitals:   07/19/20 0745 07/19/20 1158  BP:   (!) 142/94  Pulse:  84  Resp:  18  Temp:  98.5 F (36.9 C)  SpO2: 98% 100%   Vitals:   07/19/20 0534 07/19/20 0732 07/19/20 0745 07/19/20 1158  BP: (!) 142/90 134/80  (!) 142/94  Pulse: 74 77  84  Resp: 16 16  18   Temp: 97.9 F (36.6 C) 98.2 F (36.8 C)  98.5 F (36.9 C)  TempSrc: Oral Oral    SpO2: 97% 97% 98% 100%  Weight:      Height:        General: Pt is alert, awake, not in acute distress Cardiovascular: RRR, S1/S2 +, no rubs, no gallops Respiratory: CTA bilaterally, no wheezing, no rhonchi Abdominal: Soft, NT, ND, bowel sounds + Extremities: no edema, no cyanosis    The results of significant diagnostics from this hospitalization (including imaging, microbiology, ancillary and laboratory) are listed below for reference.     Microbiology: Recent Results (from the past 240 hour(s))  Resp Panel by RT-PCR (Flu A&B, Covid) Nasopharyngeal Swab     Status: None   Collection Time: 07/17/20  2:33 AM   Specimen: Nasopharyngeal Swab; Nasopharyngeal(NP) swabs in vial transport medium  Result Value Ref Range Status   SARS Coronavirus 2 by RT PCR NEGATIVE NEGATIVE Final    Comment: (NOTE) SARS-CoV-2 target nucleic acids are NOT DETECTED.  The SARS-CoV-2 RNA is generally detectable in upper respiratory specimens during  the acute phase of infection. The lowest concentration of SARS-CoV-2 viral copies this assay can detect is 138 copies/mL. A negative result does not preclude SARS-Cov-2 infection and should not be used as the sole basis for treatment or other patient management decisions. A negative result may occur with  improper specimen collection/handling, submission of specimen other than nasopharyngeal swab, presence of viral mutation(s) within the areas targeted by this assay, and inadequate number of viral copies(<138 copies/mL). A negative result must be combined with clinical observations, patient history, and epidemiological information. The expected result is  Negative.  Fact Sheet for Patients:  EntrepreneurPulse.com.au  Fact Sheet for Healthcare Providers:  IncredibleEmployment.be  This test is no t yet approved or cleared by the [place] FDA and  has been authorized for detection and/or diagnosis of SARS-CoV-2 by FDA under an Emergency Use Authorization (EUA). This EUA will remain  in effect (meaning this test can be used) for the duration of the COVID-19 declaration under Section 564(b)(1) of the Act, 21 U.S.C.section 360bbb-3(b)(1), unless the authorization is terminated  or revoked sooner.       Influenza A by PCR NEGATIVE NEGATIVE Final   Influenza B by PCR NEGATIVE NEGATIVE Final    Comment: (NOTE) The Xpert Xpress SARS-CoV-2/FLU/RSV plus assay is intended as an aid in the diagnosis of influenza from Nasopharyngeal swab specimens and should not be used as a sole basis for treatment. Nasal washings and aspirates are unacceptable for Xpert Xpress SARS-CoV-2/FLU/RSV testing.  Fact Sheet for Patients: EntrepreneurPulse.com.au  Fact Sheet for Healthcare Providers: IncredibleEmployment.be  This test is not yet approved or cleared by the [place] FDA and has been authorized for detection and/or diagnosis of SARS-CoV-2 by FDA under an Emergency Use Authorization (EUA). This EUA will remain in effect (meaning this test can be used) for the duration of the COVID-19 declaration under Section 564(b)(1) of the Act, 21 U.S.C. section 360bbb-3(b)(1), unless the authorization is terminated or revoked.  Performed at [facility], [address] [place], [place] [address]   Culture, blood (routine x 2) Call MD if unable to obtain prior to antibiotics being given     Status: None (Preliminary result)   Collection Time: 07/17/20  9:39 AM   Specimen: BLOOD  Result Value Ref Range Status   Specimen Description BLOOD RIGHT ARM  Final   Special  Requests   Final    BOTTLES DRAWN AEROBIC AND ANAEROBIC Blood Culture adequate volume   Culture   Final    NO GROWTH 2 DAYS Performed at [facility], [address], [place], [place] [address]    Report Status PENDING  Incomplete  Culture, blood (routine x 2) Call MD if unable to obtain prior to antibiotics being given     Status: None (Preliminary result)   Collection Time: 07/17/20  9:39 AM   Specimen: BLOOD  Result Value Ref Range Status   Specimen Description BLOOD LEFT ARM  Final   Special Requests   Final    BOTTLES DRAWN AEROBIC AND ANAEROBIC Blood Culture adequate volume   Culture   Final    NO GROWTH 2 DAYS Performed at [facility], [address] [place], [place] [address]    Report Status PENDING  Incomplete     Labs: BNP (last 3 results) Recent Labs    07/17/20 0939  BNP 8.2   Basic Metabolic Panel: Recent Labs  Lab 07/15/20 1606 07/17/20 0202 07/17/20 2035 07/18/20 0404  NA 136 134*  --  136  K 3.9 3.9  --  4.2  CL 108 105  --  104  CO2 20* 17*  --  23  GLUCOSE 111* 181*  --  141*  BUN 15 25*  --  20  CREATININE 0.86 1.09*  --  0.86  CALCIUM 9.4 9.0  --  9.2  MG  --   --  2.1  --    Liver Function Tests: No results for input(s): AST, ALT, ALKPHOS, BILITOT, PROT, ALBUMIN in the last 168 hours. No results for input(s): LIPASE, AMYLASE in the last 168 hours. No results for input(s): AMMONIA in the last 168 hours. CBC: Recent Labs  Lab 07/15/20 1606 07/17/20 0202 07/18/20 0404  WBC 9.9 16.0* 17.7*  NEUTROABS 6.6 10.8*  --   HGB 15.4* 15.0 13.8  HCT 45.2 45.3 41.4  MCV 80.7 82.4 82.3  PLT 338 367 321   Cardiac Enzymes: No results for input(s): CKTOTAL, CKMB, CKMBINDEX, TROPONINI in the last 168 hours. BNP: Invalid input(s): POCBNP CBG: No results for input(s): GLUCAP in the last 168 hours. D-Dimer Recent Labs    07/17/20 0202  DDIMER <0.27   Hgb A1c No results for input(s): HGBA1C in the last 72 hours. Lipid  Profile No results for input(s): CHOL, HDL, LDLCALC, TRIG, CHOLHDL, LDLDIRECT in the last 72 hours. Thyroid function studies No results for input(s): TSH, T4TOTAL, T3FREE, THYROIDAB in the last 72 hours.  Invalid input(s): FREET3 Anemia work up No results for input(s): VITAMINB12, FOLATE, FERRITIN, TIBC, IRON, RETICCTPCT in the last 72 hours. Urinalysis    Component Value Date/Time   COLORURINE YELLOW (A) 04/07/2020 0859   APPEARANCEUR HAZY (A) 04/07/2020 0859   APPEARANCEUR Clear 03/28/2014 1540   LABSPEC 1.039 (H) 04/07/2020 0859   LABSPEC 1.021 03/28/2014 1540   PHURINE 5.0 04/07/2020 0859   GLUCOSEU NEGATIVE 04/07/2020 0859   GLUCOSEU Negative 03/28/2014 1540   HGBUR NEGATIVE 04/07/2020 0859   BILIRUBINUR SMALL (A) 04/07/2020 0859   BILIRUBINUR Negative 03/28/2014 1540   KETONESUR NEGATIVE 04/07/2020 0859   PROTEINUR 30 (A) 04/07/2020 0859   NITRITE NEGATIVE 04/07/2020 0859   LEUKOCYTESUR SMALL (A) 04/07/2020 0859   LEUKOCYTESUR 1+ 03/28/2014 1540   Sepsis Labs Invalid input(s): PROCALCITONIN,  WBC,  LACTICIDVEN Microbiology Recent Results (from the past 240 hour(s))  Resp Panel by RT-PCR (Flu A&B, Covid) Nasopharyngeal Swab     Status: None   Collection Time: 07/17/20  2:33 AM   Specimen: Nasopharyngeal Swab; Nasopharyngeal(NP) swabs in vial transport medium  Result Value Ref Range Status   SARS Coronavirus 2 by RT PCR NEGATIVE NEGATIVE Final    Comment: (NOTE) SARS-CoV-2 target nucleic acids are NOT DETECTED.  The SARS-CoV-2 RNA is generally detectable in upper respiratory specimens during the acute phase of infection. The lowest concentration of SARS-CoV-2 viral copies this assay can detect is 138 copies/mL. A negative result does not preclude SARS-Cov-2 infection and should not be used as the sole basis for treatment or other patient management decisions. A negative result may occur with  improper specimen collection/handling, submission of specimen other than  nasopharyngeal swab, presence of viral mutation(s) within the areas targeted by this assay, and inadequate number of viral copies(<138 copies/mL). A negative result must be combined with clinical observations, patient history, and epidemiological information. The expected result is Negative.  Fact Sheet for Patients:  EntrepreneurPulse.com.au  Fact Sheet for Healthcare Providers:  IncredibleEmployment.be  This test is no t yet approved or cleared by the [place] and  has been authorized for detection and/or diagnosis of SARS-CoV-2 by FDA under an Emergency Use Authorization (EUA). This EUA will remain  in effect (meaning this test can be used) for the duration of the COVID-19 declaration under Section 564(b)(1) of the Act, 21 U.S.C.section 360bbb-3(b)(1), unless the authorization is terminated  or revoked sooner.       Influenza A by PCR NEGATIVE NEGATIVE Final   Influenza B by PCR NEGATIVE NEGATIVE Final    Comment: (NOTE) The Xpert Xpress SARS-CoV-2/FLU/RSV plus assay is intended as an aid in the diagnosis of influenza from Nasopharyngeal swab specimens and should not be used as a sole basis for treatment. Nasal washings and aspirates are unacceptable for Xpert Xpress SARS-CoV-2/FLU/RSV testing.  Fact Sheet for Patients: EntrepreneurPulse.com.au  Fact Sheet for Healthcare Providers: IncredibleEmployment.be  This test is not yet approved or cleared by the [place] FDA and has been authorized for detection and/or diagnosis of SARS-CoV-2 by FDA under an Emergency Use Authorization (EUA). This EUA will remain in effect (meaning this test can be used) for the duration of the COVID-19 declaration under Section 564(b)(1) of the Act, 21 U.S.C. section 360bbb-3(b)(1), unless the authorization is terminated or revoked.  Performed at [facility], [address] [place], [place]  [address]   Culture, blood (routine x 2) Call MD if unable to obtain prior to antibiotics being given     Status: None (Preliminary result)   Collection Time: 07/17/20  9:39 AM   Specimen: BLOOD  Result Value Ref Range Status   Specimen Description BLOOD RIGHT ARM  Final   Special Requests   Final    BOTTLES DRAWN AEROBIC AND ANAEROBIC Blood Culture adequate volume   Culture   Final    NO GROWTH 2 DAYS Performed at [facility], [address], [place], [place] [address]    Report Status PENDING  Incomplete  Culture, blood (routine x 2) Call MD if unable to obtain prior to antibiotics being given     Status: None (Preliminary result)   Collection Time: 07/17/20  9:39 AM   Specimen: BLOOD  Result Value Ref Range Status   Specimen Description BLOOD LEFT ARM  Final   Special Requests   Final    BOTTLES DRAWN AEROBIC AND ANAEROBIC Blood Culture adequate volume   Culture   Final    NO GROWTH 2 DAYS Performed at [facility], [address], [place], [place] [address]    Report Status PENDING  Incomplete     Time coordinating discharge: Over 30 minutes  SIGNED:   Sidney Ace, MD  [facility] 07/19/2020, 2:35 PM Pager   If 7PM-7AM, please contact night-coverage

## 2020-07-19 NOTE — Plan of Care (Signed)

## 2020-07-20 ENCOUNTER — Telehealth: Payer: Self-pay

## 2020-07-20 NOTE — Telephone Encounter (Signed)
Transition Care Management Unsuccessful Follow-up Telephone Call  Date of discharge and from where:  07/19/2020 from [facility]  Attempts:  1st Attempt  Reason for unsuccessful TCM follow-up call:  Left voice message

## 2020-07-21 NOTE — Telephone Encounter (Signed)
Transition Care Management Follow-up Telephone Call  Date of discharge and from where: 07/19/2020 from [facility]  How have you been since you were released from the hospital? Pt stated that she is feeling okay today and a bit better than when she went to the Ed.   Any questions or concerns? No  Items Reviewed:  Did the pt receive and understand the discharge instructions provided? Yes   Medications obtained and verified? Yes   Other? No   Any new allergies since your discharge? No   Dietary orders reviewed? Heart Healthy / Carb Modified  Do you have support at home? Yes   Functional Questionnaire: (I = Independent and D = Dependent) ADLs: I  Bathing/Dressing- I  Meal Prep- I  Eating- I  Maintaining continence- I  Transferring/Ambulation- I  Managing Meds- I  Follow up appointments reviewed:   PCP Hospital f/u appt confirmed? No    Specialist Hospital f/u appt confirmed? Yes  Scheduled to see Wallene Huh, MD on 08/22/2020 @ 09:45am.  Are transportation arrangements needed? No   If their condition worsens, is the pt aware to call PCP or go to the Emergency Dept.? Yes  Was the patient provided with contact information for the PCP's office or ED? Yes  Was to pt encouraged to call back with questions or concerns? Yes

## 2020-07-22 LAB — CULTURE, BLOOD (ROUTINE X 2)
Culture: NO GROWTH
Culture: NO GROWTH
Special Requests: ADEQUATE
Special Requests: ADEQUATE

## 2020-09-27 ENCOUNTER — Ambulatory Visit (INDEPENDENT_AMBULATORY_CARE_PROVIDER_SITE_OTHER): Payer: Medicaid Other | Admitting: Urology

## 2020-09-27 ENCOUNTER — Other Ambulatory Visit: Payer: Self-pay

## 2020-09-27 ENCOUNTER — Encounter: Payer: Self-pay | Admitting: Urology

## 2020-09-27 VITALS — BP 117/78 | HR 116 | Ht 65.0 in | Wt 195.0 lb

## 2020-09-27 DIAGNOSIS — N3946 Mixed incontinence: Secondary | ICD-10-CM | POA: Diagnosis not present

## 2020-09-27 DIAGNOSIS — R32 Unspecified urinary incontinence: Secondary | ICD-10-CM

## 2020-09-27 LAB — URINALYSIS, COMPLETE
Bilirubin, UA: NEGATIVE
Glucose, UA: NEGATIVE
Nitrite, UA: NEGATIVE
Protein,UA: NEGATIVE
RBC, UA: NEGATIVE
Specific Gravity, UA: >1.03 — ABNORMAL HIGH (ref 1.005–1.030)
Urobilinogen, Ur: 0.2 mg/dL (ref 0.2–1.0)
pH, UA: 5 (ref 5.0–7.5)

## 2020-09-27 LAB — MICROSCOPIC EXAMINATION: Bacteria, UA: NONE SEEN

## 2020-09-27 LAB — BLADDER SCAN AMB NON-IMAGING: Scan Result: 5

## 2020-09-27 MED ORDER — GEMTESA 75 MG PO TABS: 75.0000 mg | ORAL_TABLET | Freq: Every day | ORAL | 3 refills | Status: DC

## 2020-09-27 NOTE — Progress Notes (Signed)
09/27/2020 4:17 PM   Jenna Tyler 26-Jan-1966 [id number]  Referring provider: [facility] [place],  [place] [address]  Chief Complaint  Patient presents with  . Urinary Incontinence    HPI: 55 year old female referred for further evaluation of urinary incontinence.  She reports history of urinary urgency, frequency, and occasional urge incontinence.  She is also concerned that her "bladder has dropped".  Her symptoms started several months ago but have acutely worsened.  Initially she was wearing pads and is now wearing depends.  She wears 3-4 daily because of episodes of incontinence.  She describes episodes where her feet hit the floor in the morning and she cannot get to the bathroom.  She also struggles to get her underwear down on time.  This is extremely embarrassing to her is impacted the quality of her life significantly.  She does have longstanding stress urinary incontinence with leakage with laughing, coughing and sneezing.  This is stable and somewhat bothersome to her.  She denies any dysuria or gross hematuria.  No issues with UTIs.  She has been pregnant 3 times with 4 children including fraternal twins.  She is status post hysterectomy with bilateral oophorectomy in 2001.  This was secondary to uterine fibroids, benign causes.  She does have a known history of a rectocele discovered by her gynecologist.  She has not seen her gynecologist in quite some time.  She does report that she does have some tenderness of her vagina at times but no overt bulging.  This does not really bother her.  She did a pelvic exam performed by Dr. Einar Pheasant which was unremarkable without any obvious prolapse.  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  She underwent surgical resection of this in 2014 by urogynecology at [facility].  She cannot recall what the final pathology was.  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  She is never tried any medications.  She was treated for presumptive UTI with the onset of her symptoms with this did not improve.  Complex medical history includes NF1, seizures, cogitative decline.  PVR 7 cc.  UA today is negative, see epic.    PMH: Past Medical History:  Diagnosis Date  . Asthma   . COPD (chronic obstructive pulmonary disease) ([place])   . Dyspnea   . Guillain Barr syndrome ([place])   . Hyperlipidemia   . Hypertension   . Hypothyroidism   . 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 Pancreatitis     Surgical History: Past Surgical History:  Procedure Laterality Date  . ABDOMINAL HYSTERECTOMY    . BREAST BIOPSY Right 2012   negative  . CHOLECYSTECTOMY    . 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 OTHER SURGICAL HISTORY     excision of lipoma  . OTHER SURGICAL HISTORY     Abdominal surgery  . THYROID SURGERY     thyroidectomy  . XI ROBOTIC LAPAROSCOPIC ASSISTED APPENDECTOMY N/A 04/08/2020   Procedure: XI ROBOTIC LAPAROSCOPIC ASSISTED APPENDECTOMY;  Surgeon: Benjamine Sprague, DO;  Location: [facility] ORS;  Service: General;  Laterality: N/A;    Home Medications:  Allergies as of 09/27/2020      Reactions   Nsaids Other (See Comments)   Internal bleeding  Intestinal bleeding   Adhesive [tape] Rash   Aspirin Other (See Comments)   Cannot take due to ulcers.  Penicillins Rash   Has patient had a PCN reaction causing immediate rash, facial/tongue/throat swelling, SOB or lightheadedness with hypotension: Yes Has patient had a PCN reaction causing severe rash involving mucus membranes or skin necrosis: No Has patient had a PCN reaction that required hospitalization: No Has patient had a PCN reaction occurring within the last 10 years: Yes If all of the above answers are "NO", then may proceed with Cephalosporin use. Patient tolerated  ANCEF administration       Medication List       Accurate as of September 27, 2020  4:17 PM. If you have any questions, ask your nurse or doctor.        albuterol (2.5 MG/3ML) 0.083% nebulizer solution Commonly known as: PROVENTIL Inhale 3 mLs into the lungs every 6 (six) hours as needed.   amitriptyline 10 MG tablet Commonly known as: ELAVIL Take 40 mg by mouth at bedtime.   azithromycin 500 MG tablet Commonly known as: ZITHROMAX Take 500 mg by mouth daily.   baclofen 10 MG tablet Commonly known as: LIORESAL Take 10 mg by mouth daily.   benzonatate 200 MG capsule Commonly known as: TESSALON Take 1 capsule (200 mg total) by mouth 3 (three) times daily.   gabapentin 300 MG capsule Commonly known as: NEURONTIN Take 900-1,200 mg by mouth See admin instructions. Take 3 capsules (900mg ) by mouth every morning, take 3 capsules (900mg ) by mouth daily at lunchtime, take 3 capsules (900mg ) by mouth every evening and take 4 capsules (1200mg ) by mouth every night at bedtime   Gemtesa 75 MG Tabs Generic drug: Vibegron Take 75 mg by mouth daily. Started by: Hollice Espy, MD   levothyroxine 150 MCG tablet Commonly known as: SYNTHROID Take 150 mcg by mouth daily before breakfast.   methocarbamol 500 MG tablet Commonly known as: ROBAXIN Take 1.5 tablets by mouth every 8 (eight) hours as needed.   rOPINIRole 1 MG tablet Commonly known as: REQUIP Take 1-2 mg by mouth See admin instructions. Take 1 tablet (1mg ) by mouth every morning and take 2 tablets (2mg ) by mouth every night at bedtime   Symbicort 160-4.5 MCG/ACT inhaler Generic drug: budesonide-formoterol Inhale 2 puffs into the lungs 2 (two) times daily.   venlafaxine XR 75 MG 24 hr capsule Commonly known as: EFFEXOR-XR Take 75 mg by mouth daily.       Allergies:  Allergies  Allergen Reactions  . Nsaids Other (See Comments)    Internal bleeding  Intestinal bleeding  . Adhesive [Tape] Rash  . Aspirin Other (See  Comments)    Cannot take due to ulcers.  . Penicillins Rash    Has patient had a PCN reaction causing immediate rash, facial/tongue/throat swelling, SOB or lightheadedness with hypotension: Yes Has patient had a PCN reaction causing severe rash involving mucus membranes or skin necrosis: No Has patient had a PCN reaction that required hospitalization: No Has patient had a PCN reaction occurring within the last 10 years: Yes If all of the above answers are "NO", then may proceed with Cephalosporin use.  Patient tolerated ANCEF administration     Family History: Family History  Problem Relation Age of Onset  . Breast cancer Sister 81  . Breast cancer Maternal Aunt 69  . Breast cancer Maternal Grandmother 60  . Breast cancer Maternal Aunt 98  . Breast cancer Cousin 40       maternal side    Social History:  reports that she quit smoking about 22 years ago. She has  never used smokeless tobacco. She reports previous alcohol use. She reports that she does not use drugs.   Physical Exam: BP 117/78   Pulse (!) 116   Ht 5\' 5"  (1.651 m)   Wt 195 lb (88.5 kg)   BMI 32.45 kg/m   Constitutional:  Alert and oriented, No acute distress. HEENT: [place] AT, moist mucus membranes.  Trachea midline, no masses. Cardiovascular: No clubbing, cyanosis, or edema. Respiratory: Normal respiratory effort, no increased work of breathing. GI: Obese Skin: No rashes, bruises or suspicious lesions. Neurologic: Grossly intact, no focal deficits, moving all 4 extremities. Psychiatric: Normal mood and affect.  Laboratory Data: Lab Results  Component Value Date   WBC 17.7 (H) 07/18/2020   HGB 13.8 07/18/2020   HCT 41.4 07/18/2020   MCV 82.3 07/18/2020   PLT 321 07/18/2020    Lab Results  Component Value Date   CREATININE 0.86 07/18/2020    Lab Results  Component Value Date   HGBA1C 6.3 (H) 07/10/2019     Assessment & Plan:    1. Mixed stress and urge urinary incontinence Severe mixed stress  incontinence without previous intervention  She has no evidence of microscopic hematuria, UTIs, or urinary retention is contributing factors.  's point time, there is no concern for any underlying bladder pathology will consider this for the future if she fails respond to treatment.  We discussed the pathophysiology of each both urge incontinence as well as stress incontinence.  She is familiar with the pathways of treating each of these.  For stress incontinence, start with weight loss, pelvic floor exercises and consideration of surgical intervention down the road if she fails to improve.  For her severe urgency and urge incontinence, will start with pharmacotherapy as she is already implemented behavioral therapy.  Based on her history of cognitive decline and neurologic issues, she may be better served with a beta 3 agonist.  We will plan to try Gemtesa 75 mg daily reassess her urinary symptoms in 3 4 weeks with PVR.  If she feels improved, will attempt another medication.  Consider pelvic exam/cystoscopy if her symptoms remain refractory.  She may also need urodynamics down the road.  She understands all this.   - Bladder Scan (Post Void Residual) in office - Urinalysis, Complete  Follow-up in 4 weeks with recheck of urinary symptoms/PVR  Hollice Espy, MD  [facility] [address] [place], [place] [address] [phone]

## 2020-09-29 ENCOUNTER — Telehealth: Payer: Self-pay | Admitting: *Deleted

## 2020-09-29 NOTE — Telephone Encounter (Signed)
PA for Gemtesa 75mg  approved #[id number]  Patient aware

## 2020-10-01 ENCOUNTER — Emergency Department
Admission: EM | Admit: 2020-10-01 | Discharge: 2020-10-01 | Disposition: A | Discharge status: Home / Self Care | Payer: Medicaid Other | Attending: Emergency Medicine | Admitting: Emergency Medicine

## 2020-10-01 ENCOUNTER — Other Ambulatory Visit: Payer: Self-pay

## 2020-10-01 ENCOUNTER — Emergency Department: Payer: Medicaid Other

## 2020-10-01 DIAGNOSIS — Z87891 Personal history of nicotine dependence: Secondary | ICD-10-CM | POA: Insufficient documentation

## 2020-10-01 DIAGNOSIS — J45909 Unspecified asthma, uncomplicated: Secondary | ICD-10-CM | POA: Insufficient documentation

## 2020-10-01 DIAGNOSIS — Z79899 Other long term (current) drug therapy: Secondary | ICD-10-CM | POA: Insufficient documentation

## 2020-10-01 DIAGNOSIS — R1032 Left lower quadrant pain: Secondary | ICD-10-CM

## 2020-10-01 DIAGNOSIS — I10 Essential (primary) hypertension: Secondary | ICD-10-CM | POA: Diagnosis not present

## 2020-10-01 DIAGNOSIS — E039 Hypothyroidism, unspecified: Secondary | ICD-10-CM | POA: Insufficient documentation

## 2020-10-01 DIAGNOSIS — J441 Chronic obstructive pulmonary disease with (acute) exacerbation: Secondary | ICD-10-CM | POA: Insufficient documentation

## 2020-10-01 DIAGNOSIS — Z7951 Long term (current) use of inhaled steroids: Secondary | ICD-10-CM | POA: Insufficient documentation

## 2020-10-01 LAB — CBC
HCT: 46.4 % — ABNORMAL HIGH (ref 36.0–46.0)
Hemoglobin: 15.5 g/dL — ABNORMAL HIGH (ref 12.0–15.0)
MCH: 27.9 pg (ref 26.0–34.0)
MCHC: 33.4 g/dL (ref 30.0–36.0)
MCV: 83.5 fL (ref 80.0–100.0)
Platelets: 302 10*3/uL (ref 150–400)
RBC: 5.56 MIL/uL — ABNORMAL HIGH (ref 3.87–5.11)
RDW: 13.4 % (ref 11.5–15.5)
WBC: 10.5 10*3/uL (ref 4.0–10.5)
nRBC: 0 % (ref 0.0–0.2)

## 2020-10-01 LAB — COMPREHENSIVE METABOLIC PANEL
ALT: 27 U/L (ref 0–44)
AST: 20 U/L (ref 15–41)
Albumin: 4.2 g/dL (ref 3.5–5.0)
Alkaline Phosphatase: 127 U/L — ABNORMAL HIGH (ref 38–126)
Anion gap: 9 (ref 5–15)
BUN: 19 mg/dL (ref 6–20)
CO2: 25 mmol/L (ref 22–32)
Calcium: 9.5 mg/dL (ref 8.9–10.3)
Chloride: 106 mmol/L (ref 98–111)
Creatinine, Ser: 1.01 mg/dL — ABNORMAL HIGH (ref 0.44–1.00)
GFR, Estimated: >60 mL/min (ref >60)
Glucose, Bld: 115 mg/dL — ABNORMAL HIGH (ref 70–99)
Potassium: 4.2 mmol/L (ref 3.5–5.1)
Sodium: 140 mmol/L (ref 135–145)
Total Bilirubin: 0.7 mg/dL (ref 0.3–1.2)
Total Protein: 7.3 g/dL (ref 6.5–8.1)

## 2020-10-01 LAB — LIPASE, BLOOD: Lipase: 25 U/L (ref 11–51)

## 2020-10-01 MED ORDER — ONDANSETRON 4 MG PO TBDP
8.0000 mg | ORAL_TABLET | Freq: Once | ORAL | Status: AC
Start: 2020-10-01 — End: 2020-10-01
  Administered 2020-10-01: 8 mg via ORAL
  Filled 2020-10-01: qty 2

## 2020-10-01 MED ORDER — KETOROLAC TROMETHAMINE 60 MG/2ML IM SOLN
15.0000 mg | Freq: Once | INTRAMUSCULAR | Status: AC
  Administered 2020-10-01: 15 mg via INTRAMUSCULAR
  Filled 2020-10-01: qty 2

## 2020-10-01 MED ORDER — MAGNESIUM CITRATE PO SOLN
1.0000 | Freq: Once | ORAL | Status: AC
  Administered 2020-10-01: 1 via ORAL
  Filled 2020-10-01: qty 296

## 2020-10-01 MED ORDER — NAPROXEN 500 MG PO TABS: 500.0000 mg | ORAL_TABLET | Freq: Two times a day (BID) | ORAL | 0 refills | Status: DC

## 2020-10-01 MED ORDER — ONDANSETRON 4 MG PO TBDP: 4.0000 mg | ORAL_TABLET | Freq: Three times a day (TID) | ORAL | 0 refills | Status: DC | PRN

## 2020-10-01 MED ORDER — POLYETHYLENE GLYCOL 3350 17 GM/SCOOP PO POWD: ORAL | 0 refills | Status: DC

## 2020-10-01 NOTE — ED Triage Notes (Signed)
Pt to ER via POV with complaints of LLQ constant pain that started today, area tender upon palpation. Reports nausea. No history of diverticulitis or kidney stones. Last bowel movement yesterday. No urinary symptoms.

## 2020-10-01 NOTE — ED Notes (Signed)
'  toradol is not going to help I have a high pain tolerance due to being an addict in the past, can you ask for something stronger,' ed provider aware

## 2020-10-01 NOTE — ED Provider Notes (Signed)
[facility] Emergency Department Provider Note  ____________________________________________  Time seen: Approximately 9:42 PM  I have reviewed the triage vital signs and the nursing notes.   HISTORY  Chief Complaint Abdominal Pain    HPI Jenna Tyler is a 55 y.o. female with history of COPD, hypertension, neurofibromatosis, pancreatitis who comes the ED complaining of left lower quadrant abdominal pain that started earlier today, nonradiating, no aggravating or alleviating factors, constant and waxing waning.  Moderate to severe at present time.  Denies vomiting or diarrhea.  No black or bloody stool.  No history of diverticulitis in the past.    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   Pancreatitis      Patient Active Problem List   Diagnosis Date Noted   Acute respiratory failure with hypoxia ([place]) 07/17/2020   Chest wall pain 07/17/2020   Abdominal pain 04/07/2020   Adaptation reaction 07/14/2019   Allergic rhinitis, seasonal 07/14/2019   Breast lump 07/14/2019   Cephalalgia 07/14/2019   Chronic pain associated with significant psychosocial dysfunction 07/14/2019   Encounter for screening for lipoid disorders 07/14/2019   Feeling bilious 07/14/2019   Feeling stressed out 07/14/2019   Immunization, tetanus toxoid 07/14/2019   Infection of the upper respiratory tract 07/14/2019   Urethra, 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   Uterine spasm 07/14/2019   Breath shortness 07/14/2019   Breathlessness on exertion 07/14/2019   SOB (shortness of breath)    Sepsis without septic shock ([place]) 07/10/2019   Easy bruising 07/02/2019   Polycythemia 07/02/2019   Tremor 05/29/2019   Cognitive decline 09/29/2018   CAP (community acquired pneumonia)  08/28/2018   Seizures ([place]) 01/15/2018   Leg pain 10/18/2017   Lower extremity weakness 10/18/2017   Weakness 09/16/2017   Lower extremity pain, bilateral 09/10/2017   Other symptoms and signs involving the musculoskeletal system 07/26/2017   AKI (acute kidney injury) ([place]) 07/12/2017   HTN (hypertension) 12/29/2016   COPD exacerbation ([place]) 12/29/2016   Substance induced mood disorder ([place]) 12/29/2016   Alcohol use disorder, severe, dependence ([place]) 12/27/2016   Major depressive disorder, 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   Vomiting of fecal matter 08/15/2016   Globus sensation 08/15/2016   Multiple somatic complaints 08/15/2016   Multiple duodenal ulcers 01/24/2016   Prediabetes 07/07/2015   Neurofibromatosis, type 1 ([place]) 06/01/2015   Acquired hypothyroidism 04/04/2015   Pre-diabetes 04/04/2015   Vitamin D deficiency 04/04/2015   Borderline diabetes mellitus 04/04/2015   Anxiety 10/12/2014   Anxiety, generalized 10/12/2014   H/O: hypothyroidism 10/12/2014   Insomnia, persistent 10/12/2014   Cannot sleep 10/12/2014   Depression, major, recurrent, moderate ([place]) 10/12/2014   Drug abuse, opioid type ([place]) 10/12/2014   Nondependent opioid abuse in remission ([place]) 10/12/2014   Generalized anxiety disorder 10/12/2014   Moderate episode of recurrent major depressive disorder ([place]) 10/12/2014   Opioid abuse ([place]) 10/12/2014   History of hypothyroidism 10/12/2014   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   Severe protein-calorie malnutrition ([place]) 07/30/2014   Nausea & vomiting 07/07/2014   Hypothyroidism, unspecified 07/07/2014   Depression with anxiety 07/07/2014   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/2013   Lipoma of abdominal wall 09/15/2013   Pleurisy 08/17/2010   Vomiting and diarrhea 01/23/2010   Cough 11/24/2009  Postmenopausal atrophic vaginitis 11/21/2009   Lumbar back sprain 10/10/2009   Clinical depression 07/12/2009   Arthralgia of  multiple joints 06/30/2009   Benign neoplasm of thyroid gland 05/27/2009   Fast heart beat 05/10/2009   Persistent insomnia 02/07/2009   Mixed disorder as reaction to stress 10/09/2008   Boil of eyelid 08/01/2007   Chest pain on breathing 06/03/2007   LBP (low back pain) 03/17/2007   Epigastric pain 11/27/2006   Hypoglycemia 11/06/2005   Addiction, opium ([place]) 04/23/2002   Headache, migraine 04/23/1998   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     Past Surgical History:  Procedure Laterality Date   ABDOMINAL HYSTERECTOMY     BREAST BIOPSY Right 2012   negative   CHOLECYSTECTOMY     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 Authorizing Provider  naproxen (NAPROSYN) 500 MG tablet Take 1 tablet (500 mg total) by mouth 2 (two) times daily with a meal. 10/01/20  Yes Carrie Mew, MD  ondansetron (ZOFRAN ODT) 4 MG disintegrating tablet Take 1 tablet (4 mg total) by mouth every 8 (eight) hours as needed for nausea or vomiting. 10/01/20  Yes Carrie Mew, MD  polyethylene glycol powder (GLYCOLAX/MIRALAX) 17 GM/SCOOP powder 1 cap full in a full glass of water, two times a day for 3 days. 10/01/20  Yes Carrie Mew, MD  albuterol (PROVENTIL) (2.5 MG/3ML) 0.083% nebulizer solution Inhale 3 mLs into the lungs every 6 (six) hours as needed. 06/03/20 06/03/21  [provider]  amitriptyline (ELAVIL) 10 MG tablet Take 40 mg by mouth at bedtime.    [provider]  azithromycin (ZITHROMAX) 500 MG tablet Take 500 mg by mouth daily. 07/15/20    [provider]  baclofen (LIORESAL) 10 MG tablet Take 10 mg by mouth daily. 07/04/20   [provider]  benzonatate (TESSALON) 200 MG capsule Take 1 capsule (200 mg total) by mouth 3 (three) times daily. 07/19/20   Sidney Ace, MD  gabapentin (NEURONTIN) 300 MG capsule Take 900-1,200 mg by mouth See admin instructions. Take 3 capsules (900mg ) by mouth every morning, take 3 capsules (900mg ) by mouth daily at lunchtime, take 3 capsules (900mg ) by mouth every evening and take 4 capsules (1200mg ) by mouth every night at bedtime 07/06/19   [provider]  levothyroxine (SYNTHROID) 150 MCG tablet Take 150 mcg by mouth daily before breakfast.    [provider]  methocarbamol (ROBAXIN) 500 MG tablet Take 1.5 tablets by mouth every 8 (eight) hours as needed. 07/13/20   [provider]  rOPINIRole (REQUIP) 1 MG tablet Take 1-2 mg by mouth See admin instructions. Take 1 tablet (1mg ) by mouth every morning and take 2 tablets (2mg ) by mouth every night at bedtime    [provider]  SYMBICORT 160-4.5 MCG/ACT inhaler Inhale 2 puffs into the lungs 2 (two) times daily. 05/25/20   [provider]  venlafaxine XR (EFFEXOR-XR) 75 MG 24 hr capsule Take 75 mg by mouth daily.    [provider]  Vibegron [place])  75 MG TABS Take 75 mg by mouth daily. 09/27/20   Hollice Espy, MD     Allergies Nsaids, Adhesive [tape], Aspirin, and Penicillins   Family History  Problem Relation Age of Onset   Breast cancer Sister 4   Breast cancer Maternal Aunt 40   Breast cancer Maternal Grandmother 60   Breast cancer Maternal Aunt 40   Breast cancer Cousin 40       maternal side    Social History Social History   Tobacco Use   Smoking status: Former    Pack years: 0.00    Types: Cigarettes    Quit date: 12/28/1997    Years since quitting: 22.7   Smokeless tobacco: Never  Vaping Use   Vaping Use: Never used  Substance Use Topics   Alcohol  use: Not Currently    Alcohol/week: 0.0 standard drinks   Drug use: No    Review of Systems  Constitutional:   No fever or chills.  ENT:   No sore throat. No rhinorrhea. Cardiovascular:   No chest pain or syncope. Respiratory:   No dyspnea or cough. Gastrointestinal:   Positive as above for abdominal pain without vomiting and diarrhea.  Musculoskeletal:   Negative for focal pain or swelling All other systems reviewed and are negative except as documented above in ROS and HPI.  ____________________________________________   PHYSICAL EXAM:  VITAL SIGNS: ED Triage Vitals  Enc Vitals Group     BP 10/01/20 1753 (!) 156/103     Pulse Rate 10/01/20 1753 63     Resp 10/01/20 1753 16     Temp 10/01/20 1753 98 F (36.7 C)     Temp Source 10/01/20 1753 Oral     SpO2 10/01/20 1753 91 %     Weight 10/01/20 1754 195 lb (88.5 kg)     Height 10/01/20 1754 5\' 5"  (1.651 m)     Head Circumference --      Peak Flow --      Pain Score 10/01/20 1754 9     Pain Loc --      Pain Edu? --      Excl. in [place]? --     Vital signs reviewed, nursing assessments reviewed.   Constitutional:   Alert and oriented. Non-toxic appearance. Eyes:   Conjunctivae are normal. EOMI. PERRL. ENT      Head:   Normocephalic and atraumatic.      Nose:   Wearing a mask.      Mouth/Throat:   Wearing a mask.      Neck:   No meningismus. Full ROM. Hematological/Lymphatic/Immunilogical:   No cervical lymphadenopathy. Cardiovascular:   RRR. Symmetric bilateral radial and DP pulses.  No murmurs. Cap refill less than 2 seconds. Respiratory:   Normal respiratory effort without tachypnea/retractions. Breath sounds are clear and equal bilaterally. No wheezes/rales/rhonchi. Gastrointestinal:   Soft a with left lower quadrant tenderness. Non distended. There is no CVA tenderness.  No rebound, rigidity, or guarding. Genitourinary:   deferred Musculoskeletal:   Normal range of motion in all extremities. No joint effusions.  No  lower extremity tenderness.  No edema. Neurologic:   Normal speech and language.  Motor grossly intact. No acute focal neurologic deficits are appreciated.  Skin:    Skin is warm, dry and intact. No rash noted.  No petechiae, purpura, or bullae.  ____________________________________________    LABS (pertinent positives/negatives) (all labs ordered are listed, but only abnormal results are displayed) Labs Reviewed  COMPREHENSIVE METABOLIC PANEL -  Abnormal; Notable for the following components:      Result Value   Glucose, Bld 115 (*)    Creatinine, Ser 1.01 (*)    Alkaline Phosphatase 127 (*)    All other components within normal limits  CBC - Abnormal; Notable for the following components:   RBC 5.56 (*)    Hemoglobin 15.5 (*)    HCT 46.4 (*)    All other components within normal limits  LIPASE, BLOOD  URINALYSIS, COMPLETE (UACMP) WITH MICROSCOPIC   ____________________________________________   EKG    ____________________________________________    RADIOLOGY  CT ABDOMEN PELVIS WO CONTRAST  Result Date: 10/01/2020 CLINICAL DATA:  Left lower quadrant pain EXAM: CT ABDOMEN AND PELVIS WITHOUT CONTRAST TECHNIQUE: Multidetector CT imaging of the abdomen and pelvis was performed following the standard protocol without IV contrast. COMPARISON:  04/12/2020 FINDINGS: Lower chest: No acute abnormality Hepatobiliary: No focal liver abnormality is seen. Status post cholecystectomy. No biliary dilatation. Pancreas: No focal abnormality or ductal dilatation. Spleen: No focal abnormality.  Normal size. Adrenals/Urinary Tract: No adrenal abnormality. No focal renal abnormality. No stones or hydronephrosis. Urinary bladder is unremarkable. Stomach/Bowel: Stomach, large and small bowel grossly unremarkable. Prior appendectomy. Vascular/Lymphatic: No evidence of aneurysm or adenopathy. Reproductive: Prior hysterectomy.  No adnexal masses. Other: No free fluid or free air. Musculoskeletal: None  IMPRESSION: No acute findings in the abdomen or pelvis. Electronically Signed   By: Rolm Baptise M.D.   On: 10/01/2020 20:15    ____________________________________________   PROCEDURES Procedures  ____________________________________________  DIFFERENTIAL DIAGNOSIS   Diverticulitis, kidney stone, GI tumor recurrence, constipation  CLINICAL IMPRESSION / ASSESSMENT AND PLAN / ED COURSE  Medications ordered in the ED: Medications  magnesium citrate solution 1 Bottle (has no administration in time range)  ondansetron (ZOFRAN-ODT) disintegrating tablet 8 mg (8 mg Oral Given 10/01/20 2010)  ketorolac (TORADOL) injection 15 mg (15 mg Intramuscular Given 10/01/20 2008)    Pertinent labs & imaging results that were available during my care of the patient were reviewed by me and considered in my medical decision making (see chart for details).  Tykeshia Tourangeau was evaluated in Emergency Department on 10/01/2020 for the symptoms described in the history of present illness. She was evaluated in the context of the global COVID-19 pandemic, which necessitated consideration that the patient might be at risk for infection with the SARS-CoV-2 virus that causes COVID-19. Institutional protocols and algorithms that pertain to the evaluation of patients at risk for COVID-19 are in a state of rapid change based on information released by regulatory bodies including the CDC and federal and state organizations. These policies and algorithms were followed during the patient's care in the ED.   Patient presents with left lower quadrant pain and tenderness.  Vital signs are normal.  Given Toradol and Zofran for symptom relief.  Labs are normal.  CT scan unremarkable, no signs of diverticulitis or bowel obstruction.  When I viewed the images, I see that the distal large intestine in the left lower quadrant appears relatively decompressed.  There is no transition point, and I question some proximal constipation as  a source of her symptoms.  Recommend mag citrate and MiraLAX and PCP follow-up.      ____________________________________________   FINAL CLINICAL IMPRESSION(S) / ED DIAGNOSES    Final diagnoses:  Left lower quadrant abdominal pain     ED Discharge Orders          Ordered    polyethylene glycol powder (GLYCOLAX/MIRALAX) 17 GM/SCOOP  powder        10/01/20 2142    ondansetron (ZOFRAN ODT) 4 MG disintegrating tablet  Every 8 hours PRN        10/01/20 2142    naproxen (NAPROSYN) 500 MG tablet  2 times daily with meals        10/01/20 2142            Portions of this note were generated with dragon dictation software. Dictation errors may occur despite best attempts at proofreading.   Carrie Mew, MD 10/01/20 2146

## 2020-10-27 ENCOUNTER — Ambulatory Visit: Payer: Self-pay | Admitting: Physician Assistant

## 2020-11-03 ENCOUNTER — Ambulatory Visit: Payer: Self-pay | Admitting: Physician Assistant

## 2020-11-14 ENCOUNTER — Telehealth: Payer: Self-pay

## 2020-11-14 NOTE — Telephone Encounter (Signed)
Pt calls triage line and states that she is currently having a COPD exacerbation episode. She questions if COPD can contribute to the increase in urinary incontinence that she is having. She states that even when not coughing she is having significant urine leakage. Denies dysuria, fever, or chills. She has an upcoming appt in 2 days but would like an answer to this question prior to then. Please advise.

## 2020-11-15 NOTE — Telephone Encounter (Signed)
Spoke with patient about moving her appt out and she decided that was best. Patient notes she is no longer taking Gemtesa as she believes it is contributing to weight gain. Rescheduled patient to 8/4 to give her some time to get over her COPD exacerbation. Patient verbalized understanding.

## 2020-11-16 ENCOUNTER — Ambulatory Visit: Payer: Self-pay | Admitting: Physician Assistant

## 2020-11-24 ENCOUNTER — Other Ambulatory Visit: Payer: Self-pay

## 2020-11-24 ENCOUNTER — Ambulatory Visit (INDEPENDENT_AMBULATORY_CARE_PROVIDER_SITE_OTHER): Payer: Medicaid Other | Admitting: Physician Assistant

## 2020-11-24 ENCOUNTER — Encounter: Payer: Self-pay | Admitting: Physician Assistant

## 2020-11-24 VITALS — BP 108/73 | HR 106 | Ht 65.0 in | Wt 194.0 lb

## 2020-11-24 DIAGNOSIS — N3946 Mixed incontinence: Secondary | ICD-10-CM | POA: Diagnosis not present

## 2020-11-24 LAB — BLADDER SCAN AMB NON-IMAGING

## 2020-11-24 MED ORDER — OXYBUTYNIN CHLORIDE ER 10 MG PO TB24: 10.0000 mg | ORAL_TABLET | Freq: Every day | ORAL | 0 refills | Status: DC

## 2020-11-24 NOTE — Progress Notes (Signed)
11/24/2020 5:14 PM   Jenna Tyler 09-Oct-1965 [id number]  CC: Chief Complaint  Patient presents with   Follow-up    HPI: Jenna Tyler is a 55 y.o. female with PMH OAB wet with mixed urge and stress incontinence who presents today for symptom recheck and PVR on Gemtesa.   Today she reports she stopped Gemtesa after about 2 weeks due to concerns for weight gain on this medication.  She denies a change in her urinary symptoms on the medication.  Today she denies dysuria and gross hematuria.  She reports dry eye and constipation at baseline, but she does not take any medication or supplements for these.  She took Myrbetriq many years ago when she had a urinary catheter in place for management of bladder spasms.  She reports being on a fixed income and would like to keep her medication costs as low as possible.  PVR 1 mL.  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   OTHER SURGICAL HISTORY     excision of lipoma   OTHER SURGICAL HISTORY     Abdominal surgery   THYROID SURGERY     thyroidectomy   XI ROBOTIC LAPAROSCOPIC ASSISTED APPENDECTOMY N/A 04/08/2020   Procedure: XI ROBOTIC LAPAROSCOPIC ASSISTED APPENDECTOMY;  Surgeon: Benjamine Sprague, DO;  Location: [facility] ORS;  Service: General;  Laterality: N/A;    Home Medications:  Allergies as of 11/24/2020       Reactions   Nsaids Other (See Comments)   Internal bleeding  Intestinal bleeding   Tizanidine Other (See Comments)   Visual hallucinations   Adhesive [tape] Rash   Aspirin Other  (See Comments)   Cannot take due to ulcers.   Penicillins Rash   Has patient had a PCN reaction causing immediate rash, facial/tongue/throat swelling, SOB or lightheadedness with hypotension: Yes Has patient had a PCN reaction causing severe rash involving mucus membranes or skin necrosis: No Has patient had a PCN reaction that required hospitalization: No Has patient had a PCN reaction occurring within the last 10 years: Yes If all of the above answers are "NO", then may proceed with Cephalosporin use. Patient tolerated ANCEF administration         Medication List        Accurate as of November 24, 2020  5:14 PM. If you have any questions, ask your nurse or doctor.          STOP taking these medications    azithromycin 500 MG tablet Commonly known as: ZITHROMAX Stopped by: Debroah Loop, PA-C   Gemtesa 75 MG Tabs Generic drug: Vibegron Stopped by: Debroah Loop, PA-C       TAKE these medications    albuterol (2.5 MG/3ML) 0.083% nebulizer solution Commonly known as: PROVENTIL Inhale 3 mLs into the lungs every 6 (six) hours as needed.   amitriptyline 10 MG tablet Commonly known as: ELAVIL Take 40 mg by mouth at bedtime.   amitriptyline 75 MG tablet Commonly known as: ELAVIL Take 75 mg by  mouth at bedtime.   baclofen 10 MG tablet Commonly known as: LIORESAL Take 10 mg by mouth daily.   benzonatate 200 MG capsule Commonly known as: TESSALON Take 1 capsule (200 mg total) by mouth 3 (three) times daily.   gabapentin 300 MG capsule Commonly known as: NEURONTIN Take 900-1,200 mg by mouth See admin instructions. Take 3 capsules ('900mg'$ ) by mouth every morning, take 3 capsules ('900mg'$ ) by mouth daily at lunchtime, take 3 capsules ('900mg'$ ) by mouth every evening and take 4 capsules ('1200mg'$ ) by mouth every night at bedtime   gabapentin 800 MG tablet Commonly known as: NEURONTIN Take 800 mg by mouth 4 (four) times daily.   levothyroxine 150 MCG  tablet Commonly known as: SYNTHROID Take 150 mcg by mouth daily before breakfast.   methocarbamol 500 MG tablet Commonly known as: ROBAXIN Take 1.5 tablets by mouth every 8 (eight) hours as needed.   naproxen 500 MG tablet Commonly known as: Naprosyn Take 1 tablet (500 mg total) by mouth 2 (two) times daily with a meal.   ondansetron 4 MG disintegrating tablet Commonly known as: Zofran ODT Take 1 tablet (4 mg total) by mouth every 8 (eight) hours as needed for nausea or vomiting.   oxybutynin 10 MG 24 hr tablet Commonly known as: DITROPAN-XL Take 1 tablet (10 mg total) by mouth daily. Started by: Debroah Loop, PA-C   polyethylene glycol powder 17 GM/SCOOP powder Commonly known as: GLYCOLAX/MIRALAX 1 cap full in a full glass of water, two times a day for 3 days.   rOPINIRole 1 MG tablet Commonly known as: REQUIP Take 1-2 mg by mouth See admin instructions. Take 1 tablet ('1mg'$ ) by mouth every morning and take 2 tablets ('2mg'$ ) by mouth every night at bedtime   Symbicort 160-4.5 MCG/ACT inhaler Generic drug: budesonide-formoterol Inhale 2 puffs into the lungs 2 (two) times daily.   venlafaxine XR 75 MG 24 hr capsule Commonly known as: EFFEXOR-XR Take 75 mg by mouth daily.        Allergies:  Allergies  Allergen Reactions   Nsaids Other (See Comments)    Internal bleeding  Intestinal bleeding   Tizanidine Other (See Comments)    Visual hallucinations   Adhesive [Tape] Rash   Aspirin Other (See Comments)    Cannot take due to ulcers.   Penicillins Rash    Has patient had a PCN reaction causing immediate rash, facial/tongue/throat swelling, SOB or lightheadedness with hypotension: Yes Has patient had a PCN reaction causing severe rash involving mucus membranes or skin necrosis: No Has patient had a PCN reaction that required hospitalization: No Has patient had a PCN reaction occurring within the last 10 years: Yes If all of the above answers are "NO", then may  proceed with Cephalosporin use.  Patient tolerated ANCEF administration     Family History: Family History  Problem Relation Age of Onset   Breast cancer Sister 81   Breast cancer Maternal Aunt 65   Breast cancer Maternal Grandmother 60   Breast cancer Maternal Aunt 40   Breast cancer Cousin 40       maternal side    Social History:   reports that she quit smoking about 22 years ago. Her smoking use included cigarettes. She has never used smokeless tobacco. She reports previous alcohol use. She reports that she does not use drugs.  Physical Exam: BP 108/73   Pulse (!) 106   Ht '5\' 5"'$  (1.651 m)   Wt 194 lb (88 kg)   BMI  32.28 kg/m   Constitutional:  Alert and oriented, no acute distress, nontoxic appearing HEENT: [place], AT Cardiovascular: No clubbing, cyanosis, or edema Respiratory: Normal respiratory effort, no increased work of breathing Skin: No rashes, bruises or suspicious lesions Neurologic: Grossly intact, no focal deficits, moving all 4 extremities Psychiatric: Normal mood and affect  Laboratory Data: Results for orders placed or performed in visit on 11/24/20  Bladder Scan (Post Void Residual) in office  Result Value Ref Range   Scan Result 63m    Assessment & Plan:   1. Mixed stress and urge urinary incontinence We discussed varying treatment options today including Myrbetriq versus anticholinergics.  Patient declined Myrbetriq due to cost.  I explained that trospium will be the safest anticholinergic medication for her to try due to concerns for cognitive decline, however unfortunately it does not appear that her insurance covers this medication either.  Ultimately, patient wishes to proceed with a trial of the cheapest anticholinergic medication that her insurance will cover, which appears to be oxybutynin XL.  We will plan for a 1 month trial with symptom recheck and PVR upon completion.  If no improvement, okay to try samples of Myrbetriq.  We also discussed  alternative therapies including PTNS, intravesical Botox, and InterStim, however she does not wish to proceed with these at this time. - Bladder Scan (Post Void Residual) in office - oxybutynin (DITROPAN-XL) 10 MG 24 hr tablet; Take 1 tablet (10 mg total) by mouth daily.  Dispense: 30 tablet; Refill: 0  Return in about 4 weeks (around 12/22/2020) for Symptom recheck with PVR.  SDebroah Loop PA-C  [facility] [address] S[place]adohta Lake[place]everly [place] [phone]

## 2020-11-30 ENCOUNTER — Other Ambulatory Visit: Payer: Self-pay | Admitting: Family Medicine

## 2020-11-30 DIAGNOSIS — Z1231 Encounter for screening mammogram for malignant neoplasm of breast: Secondary | ICD-10-CM

## 2020-12-07 ENCOUNTER — Emergency Department: Payer: Medicaid Other

## 2020-12-07 ENCOUNTER — Other Ambulatory Visit: Payer: Self-pay

## 2020-12-07 ENCOUNTER — Emergency Department
Admission: EM | Admit: 2020-12-07 | Discharge: 2020-12-07 | Disposition: A | Discharge status: Home / Self Care | Payer: Medicaid Other | Attending: Emergency Medicine | Admitting: Emergency Medicine

## 2020-12-07 DIAGNOSIS — Z79899 Other long term (current) drug therapy: Secondary | ICD-10-CM | POA: Diagnosis not present

## 2020-12-07 DIAGNOSIS — R202 Paresthesia of skin: Secondary | ICD-10-CM

## 2020-12-07 DIAGNOSIS — Z7951 Long term (current) use of inhaled steroids: Secondary | ICD-10-CM | POA: Diagnosis not present

## 2020-12-07 DIAGNOSIS — R531 Weakness: Secondary | ICD-10-CM

## 2020-12-07 DIAGNOSIS — E039 Hypothyroidism, unspecified: Secondary | ICD-10-CM | POA: Diagnosis not present

## 2020-12-07 DIAGNOSIS — I10 Essential (primary) hypertension: Secondary | ICD-10-CM | POA: Insufficient documentation

## 2020-12-07 DIAGNOSIS — Z85850 Personal history of malignant neoplasm of thyroid: Secondary | ICD-10-CM | POA: Diagnosis not present

## 2020-12-07 DIAGNOSIS — J45909 Unspecified asthma, uncomplicated: Secondary | ICD-10-CM | POA: Insufficient documentation

## 2020-12-07 DIAGNOSIS — J449 Chronic obstructive pulmonary disease, unspecified: Secondary | ICD-10-CM | POA: Insufficient documentation

## 2020-12-07 DIAGNOSIS — Z87891 Personal history of nicotine dependence: Secondary | ICD-10-CM | POA: Insufficient documentation

## 2020-12-07 LAB — BASIC METABOLIC PANEL
Anion gap: 10 (ref 5–15)
BUN: 17 mg/dL (ref 6–20)
CO2: 20 mmol/L — ABNORMAL LOW (ref 22–32)
Calcium: 9.5 mg/dL (ref 8.9–10.3)
Chloride: 106 mmol/L (ref 98–111)
Creatinine, Ser: 0.8 mg/dL (ref 0.44–1.00)
GFR, Estimated: >60 mL/min (ref >60)
Glucose, Bld: 109 mg/dL — ABNORMAL HIGH (ref 70–99)
Potassium: 4.2 mmol/L (ref 3.5–5.1)
Sodium: 136 mmol/L (ref 135–145)

## 2020-12-07 LAB — CBC
HCT: 45.4 % (ref 36.0–46.0)
Hemoglobin: 15.7 g/dL — ABNORMAL HIGH (ref 12.0–15.0)
MCH: 27.5 pg (ref 26.0–34.0)
MCHC: 34.6 g/dL (ref 30.0–36.0)
MCV: 79.6 fL — ABNORMAL LOW (ref 80.0–100.0)
Platelets: 299 10*3/uL (ref 150–400)
RBC: 5.7 MIL/uL — ABNORMAL HIGH (ref 3.87–5.11)
RDW: 13 % (ref 11.5–15.5)
WBC: 9.7 10*3/uL (ref 4.0–10.5)
nRBC: 0 % (ref 0.0–0.2)

## 2020-12-07 MED ORDER — HYDROCOD POLST-CPM POLST ER 10-8 MG/5ML PO SUER
5.0000 mL | Freq: Once | ORAL | Status: AC
  Administered 2020-12-07: 5 mL via ORAL
  Filled 2020-12-07: qty 5

## 2020-12-07 MED ORDER — GADOBUTROL 1 MMOL/ML IV SOLN
7.5000 mL | Freq: Once | INTRAVENOUS | Status: AC | PRN
  Administered 2020-12-07: 7.5 mL via INTRAVENOUS

## 2020-12-07 MED ORDER — LORAZEPAM 2 MG/ML IJ SOLN
1.0000 mg | Freq: Once | INTRAMUSCULAR | Status: AC
  Administered 2020-12-07: 1 mg via INTRAVENOUS
  Filled 2020-12-07: qty 1

## 2020-12-07 MED ORDER — ALBUTEROL SULFATE (2.5 MG/3ML) 0.083% IN NEBU
2.5000 mg | INHALATION_SOLUTION | Freq: Once | RESPIRATORY_TRACT | Status: AC
  Administered 2020-12-07: 2.5 mg via RESPIRATORY_TRACT

## 2020-12-07 MED ORDER — HYDROCOD POLST-CPM POLST ER 10-8 MG/5ML PO SUER: 5.0000 mL | Freq: Two times a day (BID) | ORAL | 0 refills | Status: DC

## 2020-12-07 NOTE — ED Notes (Signed)
Patient transported to MRI 

## 2020-12-07 NOTE — ED Notes (Signed)
Pt c/o having increased SOB with a dry hacking cough while in the [place], states she has chronic COPD and is in need a breathing treatment, EDP notified and awaiting orders

## 2020-12-07 NOTE — ED Triage Notes (Signed)
Pt states a couple weeks ago she started having right sided weakness with incontinence , states she is having BL arm and leg numbness and tingling with increased incontinence since last night. Pt is a/ox4 on arrival. Speaking in complete sentence with no faical droop,

## 2020-12-07 NOTE — ED Provider Notes (Addendum)
[facility] Emergency Department Provider Note  Time seen: 12:20 PM  I have reviewed the triage vital signs and the nursing notes.   HISTORY  Chief Complaint Weakness   HPI Jenna Tyler is a 55 y.o. female with a past medical history of asthma, COPD, Guillain-Barr, hypertension, hyperlipidemia, presents to the emergency department for worsening lower extremity weakness and incontinence issues.  According to the patient for the past several years she has been experiencing worsening lower extremity weakness and intermittent incontinence as well as right upper extremity weakness which she describes now is progressed left upper extremity as well.  Patient has been following up with Dr. Melrose Nakayama of neurology.  Per record review patient had MRIs back in 2019 of the lumbar spine and more recently of the brain and C-spine.  Recent nerve conduction studies as well of the right upper extremity showing no deficits.  Patient states her lower extremity weakness has worsened progressively over the last several years but more acutely over the past 2 to 3 days along with worsening incontinence, she called her neurologist but they were not able to see her and they told her to come to the emergency department for evaluation.   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   Pancreatitis     Patient Active Problem List   Diagnosis Date Noted   Acute respiratory failure with hypoxia ([place]) 07/17/2020   Chest wall pain 07/17/2020   Abdominal pain 04/07/2020   Adaptation reaction 07/14/2019   Allergic rhinitis, seasonal 07/14/2019   Breast lump 07/14/2019   Cephalalgia 07/14/2019   Chronic pain associated with significant psychosocial dysfunction 07/14/2019   Encounter for  screening for lipoid disorders 07/14/2019   Feeling bilious 07/14/2019   Feeling stressed out 07/14/2019   Immunization, tetanus toxoid 07/14/2019   Infection of the upper respiratory tract 07/14/2019   Urethra, 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   Uterine spasm 07/14/2019   Breath shortness 07/14/2019   Breathlessness on exertion 07/14/2019   SOB (shortness of breath)    Sepsis without septic shock ([place]) 07/10/2019   Easy bruising 07/02/2019   Polycythemia 07/02/2019   Tremor 05/29/2019   Cognitive decline 09/29/2018   CAP (community acquired pneumonia) 08/28/2018   Seizures ([place]) 01/15/2018   Leg pain 10/18/2017   Lower extremity weakness 10/18/2017   Weakness 09/16/2017   Lower extremity pain, bilateral 09/10/2017   Other symptoms and signs involving the musculoskeletal system 07/26/2017   AKI (acute kidney injury) ([place]) 07/12/2017   HTN (hypertension) 12/29/2016   COPD exacerbation ([place]) 12/29/2016   Substance induced mood disorder ([place]) 12/29/2016   Alcohol use disorder, severe, dependence ([place]) 12/27/2016   Major depressive disorder, 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   Vomiting of fecal matter 08/15/2016   Globus sensation 08/15/2016   Multiple somatic complaints 08/15/2016   Multiple duodenal ulcers 01/24/2016   Prediabetes 07/07/2015   Neurofibromatosis, 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   Severe protein-calorie malnutrition ([place]) 07/30/2014   Nausea & vomiting 07/07/2014   Hypothyroidism, unspecified 07/07/2014   Depression with anxiety 07/07/2014   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/2013   Lipoma of abdominal wall 09/15/2013   Pleurisy 08/17/2010   Vomiting and diarrhea 01/23/2010   Cough 11/24/2009   Postmenopausal atrophic vaginitis 11/21/2009   Lumbar back sprain 10/10/2009   Clinical depression 07/12/2009   Arthralgia of multiple joints 06/30/2009   Benign neoplasm of thyroid gland 05/27/2009   Fast heart beat 05/10/2009   Persistent insomnia 02/07/2009   Mixed disorder as reaction to stress 10/09/2008   Boil of eyelid 08/01/2007   Chest pain on breathing 06/03/2007   LBP (low back pain) 03/17/2007   Epigastric pain 11/27/2006   Hypoglycemia 11/06/2005   Addiction, opium ([place]) 04/23/2002   Headache, migraine 04/23/1998   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    Past Surgical History:  Procedure Laterality Date   ABDOMINAL HYSTERECTOMY     BREAST BIOPSY Right 2012   negative   CHOLECYSTECTOMY     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 Authorizing Provider  albuterol (PROVENTIL) (2.5 MG/3ML) 0.083% nebulizer solution Inhale 3 mLs into the lungs every 6 (six) hours as needed. 06/03/20 06/03/21  [provider]  amitriptyline (ELAVIL) 10 MG tablet Take 40 mg by mouth at bedtime. Patient not taking: Reported on  11/24/2020    [provider]  amitriptyline (ELAVIL) 75 MG tablet Take 75 mg by mouth at bedtime. 11/10/20   [provider]  baclofen (LIORESAL) 10 MG tablet Take 10 mg by mouth daily. 07/04/20   [provider]  benzonatate (TESSALON) 200 MG capsule Take 1 capsule (200 mg total) by mouth 3 (three) times daily. Patient not taking: Reported on 11/24/2020 07/19/20   Sidney Ace, MD  gabapentin (NEURONTIN) 300 MG capsule Take 900-1,200 mg by mouth See admin instructions. Take 3 capsules ('900mg'$ ) by mouth every morning, take 3 capsules ('900mg'$ ) by mouth daily at lunchtime, take 3 capsules ('900mg'$ ) by mouth every evening and take 4 capsules ('1200mg'$ ) by mouth every night at bedtime Patient not taking: Reported on 11/24/2020 07/06/19   [provider]  gabapentin (NEURONTIN) 800 MG tablet Take 800 mg by mouth 4 (four) times daily. 11/08/20   [provider]  levothyroxine (SYNTHROID) 150 MCG tablet Take 150 mcg by mouth daily before breakfast.    [provider]  methocarbamol (ROBAXIN) 500 MG tablet Take 1.5 tablets by mouth every 8 (eight) hours as needed. 07/13/20   [provider]  naproxen (NAPROSYN) 500 MG tablet Take 1 tablet (500 mg total) by mouth 2 (two) times daily with a meal. Patient not taking: Reported on 11/24/2020 10/01/20   Carrie Mew, MD  ondansetron (ZOFRAN ODT) 4 MG disintegrating tablet Take 1 tablet (4 mg total) by mouth every 8 (eight) hours as needed for nausea or vomiting. 10/01/20   Carrie Mew, MD  oxybutynin (DITROPAN-XL) 10 MG 24 hr tablet Take 1 tablet (10 mg total) by mouth daily. 11/24/20   Vaillancourt, Aldona Bar, PA-C  polyethylene glycol powder (GLYCOLAX/MIRALAX) 17 GM/SCOOP powder 1 cap full in a full glass of water, two times a day for 3 days. Patient not taking: Reported on 11/24/2020 10/01/20   Carrie Mew, MD  rOPINIRole (REQUIP) 1 MG tablet Take 1-2 mg by mouth See admin instructions. Take 1 tablet  ('1mg'$ ) by mouth every morning and take 2 tablets ('2mg'$ ) by mouth every night at bedtime    [provider]  SYMBICORT 160-4.5 MCG/ACT inhaler Inhale 2 puffs into the lungs 2 (two) times daily. 05/25/20   [provider]  venlafaxine XR (EFFEXOR-XR) 75 MG 24 hr capsule Take 75 mg by mouth daily. Patient not taking: Reported on 11/24/2020    [provider]    Allergies  Allergen Reactions   Nsaids Other (See Comments)    Internal bleeding  Intestinal bleeding   Tizanidine Other (See Comments)    Visual hallucinations   Adhesive [Tape] Rash   Aspirin Other (See Comments)    Cannot take due to ulcers.   Penicillins Rash    Has patient had a PCN reaction causing immediate rash, facial/tongue/throat swelling, SOB or lightheadedness with hypotension: Yes Has patient had a PCN reaction causing severe rash involving mucus membranes or skin necrosis: No Has patient had a PCN reaction that required hospitalization: No Has patient had a PCN reaction occurring within the last 10 years: Yes If all of the above answers are "NO", then may proceed with Cephalosporin use.  Patient tolerated ANCEF administration     Family History  Problem Relation Age of Onset   Breast cancer Sister 27   Breast cancer Maternal Aunt 35   Breast cancer Maternal Grandmother 60   Breast cancer Maternal Aunt 40   Breast cancer Cousin 40       maternal side    Social History Social History   Tobacco Use   Smoking status: Former    Types: Cigarettes    Quit date: 12/28/1997    Years since quitting: 22.9   Smokeless tobacco: Never  Vaping Use   Vaping Use: Never used  Substance Use Topics   Alcohol use: Not Currently    Alcohol/week: 0.0 standard drinks   Drug use: No    Review of Systems Constitutional: Negative for fever. Cardiovascular: Negative for chest pain. Respiratory: Negative for shortness of breath. Gastrointestinal: Negative for abdominal pain GU: Worsening incontinence  issues that have been progressing times years. Musculoskeletal: Worsening lower extremity weakness and paresthesias Neurological: Negative for headache All other ROS negative  ____________________________________________   PHYSICAL EXAM:  VITAL SIGNS: ED Triage Vitals  Enc Vitals Group     BP 12/07/20 1001 113/82     Pulse Rate 12/07/20 1001 (!) 102     Resp 12/07/20 1001 17     Temp 12/07/20 1001 98.7 F (37.1 C)     Temp Source 12/07/20 1001 Oral     SpO2 12/07/20 1001 94 %     Weight 12/07/20 1008 194 lb (88 kg)     Height 12/07/20 1008 '5\' 5"'$  (1.651 m)     Head Circumference --      Peak Flow --      Pain  Score 12/07/20 1007 6     Pain Loc --      Pain Edu? --      Excl. in [place]? --     Constitutional: Alert and oriented. Well appearing and in no distress. Eyes: Normal exam ENT      Head: Normocephalic and atraumatic.      Mouth/Throat: Mucous membranes are moist. Cardiovascular: Normal rate, regular rhythm. Respiratory: Normal respiratory effort without tachypnea nor retractions. Breath sounds are clear a Gastrointestinal: Soft and nontender. No distention.  Musculoskeletal: Nontender with normal range of motion in all extremities. Neurologic:  Normal speech and language.  Patient states decree sensation bilateral lower extremities, weakness 3/5 in the right lower extremity 4/5 in the left lower extremity.  Equal grip strength bilaterally with 4+/5 strength in the upper extremities with no pronator drift. Skin:  Skin is warm, dry and intact.  Psychiatric: Mood and affect are normal.  ____________________________________________    EKG  EKG viewed and interpreted by myself shows sinus tachycardia 105 bpm with a narrow QRS, normal axis, normal intervals, no concerning ST changes.  ____________________________________________    RADIOLOGY  MRI shows no significant acute finding.  ____________________________________________   INITIAL IMPRESSION / ASSESSMENT  AND PLAN / ED COURSE  Pertinent labs & imaging results that were available during my care of the patient were reviewed by me and considered in my medical decision making (see chart for details).   Patient presents to the emergency department for worsening lower extremity weakness now with intermittent incontinence which is also been worsening over the past several years.  Overall the patient appears well, no distress.  Does have decreased sensation and weakness in bilateral lower extremities.  In reading the patient's neurology notes this appears to be longstanding has had multiple MRIs since 2019, had nerve conduction studies of the right upper extremity showing no findings.  However given the patient's worsening lower extremity weakness and incontinence issues her neurologist did recommend ordering an MRI with without further recent note.  We will proceed with MRI today.  MRI of the lumbar spine is essentially negative for acute abnormality.  Lab work is largely within normal limits.  Urinalysis pending.  If urinalysis is normal anticipate discharge home with neurology follow-up.  Patient just urinated and did not collect a sample.  States she does not want to wait for a sample.  She will follow-up with her doctor.  Lorra Downing was evaluated in Emergency Department on 12/07/2020 for the symptoms described in the history of present illness. She was evaluated in the context of the global COVID-19 pandemic, which necessitated consideration that the patient might be at risk for infection with the SARS-CoV-2 virus that causes COVID-19. Institutional protocols and algorithms that pertain to the evaluation of patients at risk for COVID-19 are in a state of rapid change based on information released by regulatory bodies including the CDC and federal and state organizations. These policies and algorithms were followed during the patient's care in the  ED.  ____________________________________________   FINAL CLINICAL IMPRESSION(S) / ED DIAGNOSES  Lower extremity weakness   Harvest Dark, MD 12/07/20 [place], Thurmond Hildebran, MD 12/07/20 [id number]

## 2020-12-07 NOTE — Discharge Instructions (Addendum)
Please follow-up with your neurologist as soon as possible for further evaluation and testing.  Return to the emergency department for any symptoms personally concerning to yourself.

## 2020-12-22 ENCOUNTER — Ambulatory Visit: Payer: Self-pay | Admitting: Physician Assistant

## 2020-12-28 ENCOUNTER — Other Ambulatory Visit: Payer: Self-pay | Admitting: Physician Assistant

## 2020-12-28 DIAGNOSIS — N3946 Mixed incontinence: Secondary | ICD-10-CM

## 2020-12-30 NOTE — Telephone Encounter (Signed)
Oxybutynin refill rejected; patient cancelled 1 month PVR appt. Need to check a residual on her while on the medication.

## 2021-01-09 ENCOUNTER — Other Ambulatory Visit: Payer: Self-pay | Admitting: Physician Assistant

## 2021-01-09 DIAGNOSIS — Z8669 Personal history of other diseases of the nervous system and sense organs: Secondary | ICD-10-CM

## 2021-01-09 DIAGNOSIS — R29898 Other symptoms and signs involving the musculoskeletal system: Secondary | ICD-10-CM

## 2021-01-09 DIAGNOSIS — R413 Other amnesia: Secondary | ICD-10-CM

## 2021-01-09 DIAGNOSIS — R519 Headache, unspecified: Secondary | ICD-10-CM

## 2021-01-10 ENCOUNTER — Other Ambulatory Visit: Payer: Self-pay | Admitting: Physician Assistant

## 2021-01-10 DIAGNOSIS — R32 Unspecified urinary incontinence: Secondary | ICD-10-CM

## 2021-01-10 DIAGNOSIS — Z8669 Personal history of other diseases of the nervous system and sense organs: Secondary | ICD-10-CM

## 2021-01-10 DIAGNOSIS — R9089 Other abnormal findings on diagnostic imaging of central nervous system: Secondary | ICD-10-CM

## 2021-01-10 DIAGNOSIS — R519 Headache, unspecified: Secondary | ICD-10-CM

## 2021-01-10 DIAGNOSIS — R413 Other amnesia: Secondary | ICD-10-CM

## 2021-01-10 DIAGNOSIS — R29898 Other symptoms and signs involving the musculoskeletal system: Secondary | ICD-10-CM

## 2021-01-17 ENCOUNTER — Ambulatory Visit (INDEPENDENT_AMBULATORY_CARE_PROVIDER_SITE_OTHER): Payer: Medicaid Other | Admitting: Physician Assistant

## 2021-01-17 ENCOUNTER — Encounter: Payer: Self-pay | Admitting: Physician Assistant

## 2021-01-17 ENCOUNTER — Other Ambulatory Visit: Payer: Self-pay

## 2021-01-17 VITALS — BP 111/73 | HR 105 | Ht 65.0 in | Wt 195.0 lb

## 2021-01-17 DIAGNOSIS — N3946 Mixed incontinence: Secondary | ICD-10-CM

## 2021-01-17 LAB — BLADDER SCAN AMB NON-IMAGING

## 2021-01-17 MED ORDER — MIRABEGRON ER 50 MG PO TB24: 50.0000 mg | ORAL_TABLET | Freq: Every day | ORAL | 0 refills | Status: DC

## 2021-01-17 MED ORDER — MIRABEGRON ER 25 MG PO TB24: 25.0000 mg | ORAL_TABLET | Freq: Every day | ORAL | 0 refills | Status: DC

## 2021-01-17 NOTE — Progress Notes (Signed)
01/17/2021 11:36 AM   Jenna Tyler Oct 19, 1965 [id number]  CC: Chief Complaint  Patient presents with   Urinary Incontinence   Follow-up   HPI: Jenna Tyler is a 55 y.o. female with PMH cervical stenosis, Chiari I malformation, Guillain-Barr syndrome, and OAB wet who presents today for symptom recheck and PVR on oxybutynin XL 10 mg daily.   Today she reports her urinary leakage as well as peripheral numbness have progressively worsened over the last 6 months.  She does not think that the oxybutynin is helping with her urinary symptoms.  She is now wearing diapers for sometimes large volume urinary incontinence.  PVR 19 mL.  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   OTHER SURGICAL HISTORY     excision of lipoma   OTHER SURGICAL HISTORY     Abdominal surgery   THYROID SURGERY     thyroidectomy   XI ROBOTIC LAPAROSCOPIC ASSISTED APPENDECTOMY N/A 04/08/2020   Procedure: XI ROBOTIC LAPAROSCOPIC ASSISTED APPENDECTOMY;  Surgeon: Benjamine Sprague, DO;  Location: [facility] ORS;  Service: General;  Laterality: N/A;    Home Medications:  Allergies as of 01/17/2021       Reactions   Nsaids Other (See Comments)   Internal bleeding  Intestinal bleeding   Tizanidine Other (See Comments)   Visual hallucinations   Adhesive [tape] Rash   Aspirin Other (See Comments)   Cannot take due to ulcers.   Penicillins Rash   Has patient had a PCN reaction causing immediate rash, facial/tongue/throat swelling, SOB or lightheadedness with hypotension: Yes Has  patient had a PCN reaction causing severe rash involving mucus membranes or skin necrosis: No Has patient had a PCN reaction that required hospitalization: No Has patient had a PCN reaction occurring within the last 10 years: Yes If all of the above answers are "NO", then may proceed with Cephalosporin use. Patient tolerated ANCEF administration         Medication List        Accurate as of January 17, 2021 11:36 AM. If you have any questions, ask your nurse or doctor.          STOP taking these medications    oxybutynin 10 MG 24 hr tablet Commonly known as: DITROPAN-XL Stopped by: Debroah Loop, PA-C       TAKE these medications    albuterol (2.5 MG/3ML) 0.083% nebulizer solution Commonly known as: PROVENTIL Inhale 3 mLs into the lungs every 6 (six) hours as needed.   amitriptyline 10 MG tablet Commonly known as: ELAVIL Take 40 mg by mouth at bedtime.   amitriptyline 75 MG tablet Commonly known as: ELAVIL Take 75 mg by mouth at bedtime.   baclofen 10 MG tablet Commonly known as: LIORESAL Take 10 mg by mouth daily.   benzonatate 200 MG capsule Commonly known as: TESSALON Take 1 capsule (200 mg total) by mouth 3 (three) times daily.   chlorpheniramine-HYDROcodone 10-8 MG/5ML Suer Commonly known as: Tussionex Pennkinetic ER Take 5 mLs by mouth  2 (two) times daily.   gabapentin 300 MG capsule Commonly known as: NEURONTIN Take 900-1,200 mg by mouth See admin instructions. Take 3 capsules (900mg ) by mouth every morning, take 3 capsules (900mg ) by mouth daily at lunchtime, take 3 capsules (900mg ) by mouth every evening and take 4 capsules (1200mg ) by mouth every night at bedtime   gabapentin 800 MG tablet Commonly known as: NEURONTIN Take 800 mg by mouth 4 (four) times daily.   levothyroxine 150 MCG tablet Commonly known as: SYNTHROID Take 150 mcg by mouth daily before breakfast.   methocarbamol 500 MG tablet Commonly known as: ROBAXIN Take 1.5  tablets by mouth every 8 (eight) hours as needed.   mirabegron ER 25 MG Tb24 tablet Commonly known as: MYRBETRIQ Take 1 tablet (25 mg total) by mouth daily. Started by: Debroah Loop, PA-C   naproxen 500 MG tablet Commonly known as: Naprosyn Take 1 tablet (500 mg total) by mouth 2 (two) times daily with a meal.   ondansetron 4 MG disintegrating tablet Commonly known as: Zofran ODT Take 1 tablet (4 mg total) by mouth every 8 (eight) hours as needed for nausea or vomiting.   polyethylene glycol powder 17 GM/SCOOP powder Commonly known as: GLYCOLAX/MIRALAX 1 cap full in a full glass of water, two times a day for 3 days.   rOPINIRole 1 MG tablet Commonly known as: REQUIP Take 1-2 mg by mouth See admin instructions. Take 1 tablet (1mg ) by mouth every morning and take 2 tablets (2mg ) by mouth every night at bedtime   Symbicort 160-4.5 MCG/ACT inhaler Generic drug: budesonide-formoterol Inhale 2 puffs into the lungs 2 (two) times daily.   venlafaxine XR 75 MG 24 hr capsule Commonly known as: EFFEXOR-XR Take 75 mg by mouth daily.        Allergies:  Allergies  Allergen Reactions   Nsaids Other (See Comments)    Internal bleeding  Intestinal bleeding   Tizanidine Other (See Comments)    Visual hallucinations   Adhesive [Tape] Rash   Aspirin Other (See Comments)    Cannot take due to ulcers.   Penicillins Rash    Has patient had a PCN reaction causing immediate rash, facial/tongue/throat swelling, SOB or lightheadedness with hypotension: Yes Has patient had a PCN reaction causing severe rash involving mucus membranes or skin necrosis: No Has patient had a PCN reaction that required hospitalization: No Has patient had a PCN reaction occurring within the last 10 years: Yes If all of the above answers are "NO", then may proceed with Cephalosporin use.  Patient tolerated ANCEF administration     Family History: Family History  Problem Relation Age of Onset   Breast  cancer Sister 57   Breast cancer Maternal Aunt 45   Breast cancer Maternal Grandmother 60   Breast cancer Maternal Aunt 40   Breast cancer Cousin 40       maternal side    Social History:   reports that she quit smoking about 23 years ago. Her smoking use included cigarettes. She has never used smokeless tobacco. She reports that she does not currently use alcohol. She reports that she does not use drugs.  Physical Exam: BP 111/73   Pulse (!) 105   Ht 5\' 5"  (1.651 m)   Wt 195 lb (88.5 kg)   BMI 32.45 kg/m   Constitutional:  Alert and oriented, no acute distress, nontoxic appearing HEENT: [place], AT Cardiovascular: No clubbing, cyanosis, or edema Respiratory: Normal respiratory effort, no increased work of breathing Skin:  No rashes, bruises or suspicious lesions Neurologic: Grossly intact, no focal deficits, moving all 4 extremities Psychiatric: Normal mood and affect  Laboratory Data: Results for orders placed or performed in visit on 01/17/21  Bladder Scan (Post Void Residual) in office  Result Value Ref Range   Scan Result 63mL    Assessment & Plan:   1. Mixed stress and urge urinary incontinence Urinary and other neurologic symptoms have been progressively worsening over the last 6 months, however she continues to empty well.  We will stop oxybutynin given no therapeutic benefit.  I offered her a trial of Myrbetriq, which she accepted.  If she fails Myrbetriq, may consider PTNS versus follow-up with Dr. Matilde Sprang to discuss InterStim versus intravesical Botox. - Bladder Scan (Post Void Residual) in office - mirabegron ER (MYRBETRIQ) 25 MG TB24 tablet; Take 1 tablet (25 mg total) by mouth daily.  Dispense: 28 tablet; Refill: 0  Return in about 4 weeks (around 02/14/2021) for Symptom recheck with PVR.  Debroah Loop, PA-C  [facility] [address], [place], [place] [address] [phone]

## 2021-01-23 ENCOUNTER — Ambulatory Visit
Admission: RE | Admit: 2021-01-23 | Discharge: 2021-01-23 | Disposition: A | Discharge status: Home / Self Care | Payer: Medicaid Other | Source: Ambulatory Visit | Attending: Physician Assistant | Admitting: Physician Assistant

## 2021-01-23 ENCOUNTER — Other Ambulatory Visit: Payer: Self-pay

## 2021-01-23 DIAGNOSIS — Z8669 Personal history of other diseases of the nervous system and sense organs: Secondary | ICD-10-CM | POA: Insufficient documentation

## 2021-01-23 DIAGNOSIS — R413 Other amnesia: Secondary | ICD-10-CM | POA: Insufficient documentation

## 2021-01-23 DIAGNOSIS — R519 Headache, unspecified: Secondary | ICD-10-CM | POA: Diagnosis present

## 2021-01-23 DIAGNOSIS — R29898 Other symptoms and signs involving the musculoskeletal system: Secondary | ICD-10-CM | POA: Insufficient documentation

## 2021-01-23 DIAGNOSIS — R32 Unspecified urinary incontinence: Secondary | ICD-10-CM | POA: Insufficient documentation

## 2021-01-23 DIAGNOSIS — R9089 Other abnormal findings on diagnostic imaging of central nervous system: Secondary | ICD-10-CM | POA: Diagnosis present

## 2021-01-23 MED ORDER — GADOBUTROL 1 MMOL/ML IV SOLN
9.0000 mL | Freq: Once | INTRAVENOUS | Status: AC | PRN
  Administered 2021-01-23: 9 mL via INTRAVENOUS

## 2021-02-10 ENCOUNTER — Other Ambulatory Visit (HOSPITAL_COMMUNITY): Payer: Self-pay | Admitting: Neurosurgery

## 2021-02-10 DIAGNOSIS — G935 Compression of brain: Secondary | ICD-10-CM

## 2021-02-14 ENCOUNTER — Ambulatory Visit: Payer: Medicaid Other | Admitting: Physician Assistant

## 2021-02-20 ENCOUNTER — Ambulatory Visit (HOSPITAL_COMMUNITY)
Admission: RE | Admit: 2021-02-20 | Discharge: 2021-02-20 | Disposition: A | Discharge status: Home / Self Care | Payer: Medicaid Other | Source: Ambulatory Visit | Attending: Neurosurgery | Admitting: Neurosurgery

## 2021-02-20 ENCOUNTER — Other Ambulatory Visit: Payer: Self-pay

## 2021-02-20 DIAGNOSIS — R519 Headache, unspecified: Secondary | ICD-10-CM | POA: Diagnosis not present

## 2021-02-20 DIAGNOSIS — G935 Compression of brain: Secondary | ICD-10-CM | POA: Diagnosis present

## 2021-02-22 ENCOUNTER — Ambulatory Visit: Payer: Medicaid Other | Admitting: Physician Assistant

## 2021-02-23 ENCOUNTER — Encounter: Payer: Self-pay | Admitting: Physician Assistant

## 2021-03-28 ENCOUNTER — Other Ambulatory Visit: Payer: Self-pay | Admitting: Family Medicine

## 2021-03-28 DIAGNOSIS — Z1231 Encounter for screening mammogram for malignant neoplasm of breast: Secondary | ICD-10-CM

## 2021-03-31 DIAGNOSIS — M797 Fibromyalgia: Secondary | ICD-10-CM | POA: Insufficient documentation

## 2021-04-28 ENCOUNTER — Ambulatory Visit: Payer: Medicaid Other | Admitting: Physician Assistant

## 2021-06-01 ENCOUNTER — Emergency Department
Admission: EM | Admit: 2021-06-01 | Discharge: 2021-06-01 | Disposition: A | Discharge status: Home / Self Care | Payer: Medicaid Other | Attending: Emergency Medicine | Admitting: Emergency Medicine

## 2021-06-01 ENCOUNTER — Emergency Department: Payer: Medicaid Other

## 2021-06-01 DIAGNOSIS — J449 Chronic obstructive pulmonary disease, unspecified: Secondary | ICD-10-CM | POA: Insufficient documentation

## 2021-06-01 DIAGNOSIS — R1013 Epigastric pain: Secondary | ICD-10-CM

## 2021-06-01 DIAGNOSIS — K2900 Acute gastritis without bleeding: Secondary | ICD-10-CM | POA: Insufficient documentation

## 2021-06-01 DIAGNOSIS — I10 Essential (primary) hypertension: Secondary | ICD-10-CM | POA: Diagnosis not present

## 2021-06-01 LAB — COMPREHENSIVE METABOLIC PANEL
ALT: 18 U/L (ref 0–44)
AST: 16 U/L (ref 15–41)
Albumin: 4.1 g/dL (ref 3.5–5.0)
Alkaline Phosphatase: 96 U/L (ref 38–126)
Anion gap: 7 (ref 5–15)
BUN: 16 mg/dL (ref 6–20)
CO2: 24 mmol/L (ref 22–32)
Calcium: 9 mg/dL (ref 8.9–10.3)
Chloride: 103 mmol/L (ref 98–111)
Creatinine, Ser: 1.02 mg/dL — ABNORMAL HIGH (ref 0.44–1.00)
GFR, Estimated: >60 mL/min (ref >60)
Glucose, Bld: 152 mg/dL — ABNORMAL HIGH (ref 70–99)
Potassium: 3.9 mmol/L (ref 3.5–5.1)
Sodium: 134 mmol/L — ABNORMAL LOW (ref 135–145)
Total Bilirubin: 0.5 mg/dL (ref 0.3–1.2)
Total Protein: 7.1 g/dL (ref 6.5–8.1)

## 2021-06-01 LAB — CBC WITH DIFFERENTIAL/PLATELET
Abs Immature Granulocytes: 0.05 10*3/uL (ref 0.00–0.07)
Basophils Absolute: 0 10*3/uL (ref 0.0–0.1)
Basophils Relative: 0 %
Eosinophils Absolute: 0 10*3/uL (ref 0.0–0.5)
Eosinophils Relative: 0 %
HCT: 49.2 % — ABNORMAL HIGH (ref 36.0–46.0)
Hemoglobin: 16.4 g/dL — ABNORMAL HIGH (ref 12.0–15.0)
Immature Granulocytes: 1 %
Lymphocytes Relative: 31 %
Lymphs Abs: 3.2 10*3/uL (ref 0.7–4.0)
MCH: 27 pg (ref 26.0–34.0)
MCHC: 33.3 g/dL (ref 30.0–36.0)
MCV: 81.1 fL (ref 80.0–100.0)
Monocytes Absolute: 0.8 10*3/uL (ref 0.1–1.0)
Monocytes Relative: 8 %
Neutro Abs: 6.3 10*3/uL (ref 1.7–7.7)
Neutrophils Relative %: 60 %
Platelets: 346 10*3/uL (ref 150–400)
RBC: 6.07 MIL/uL — ABNORMAL HIGH (ref 3.87–5.11)
RDW: 13.9 % (ref 11.5–15.5)
WBC: 10.4 10*3/uL (ref 4.0–10.5)
nRBC: 0 % (ref 0.0–0.2)

## 2021-06-01 LAB — URINALYSIS, ROUTINE W REFLEX MICROSCOPIC
Bacteria, UA: NONE SEEN
Glucose, UA: NEGATIVE mg/dL
Hgb urine dipstick: NEGATIVE
Ketones, ur: NEGATIVE mg/dL
Nitrite: NEGATIVE
Protein, ur: NEGATIVE mg/dL
Specific Gravity, Urine: 1.025 (ref 1.005–1.030)
pH: 5 (ref 5.0–8.0)

## 2021-06-01 LAB — LIPASE, BLOOD: Lipase: 26 U/L (ref 11–51)

## 2021-06-01 MED ORDER — IOHEXOL 300 MG/ML  SOLN
100.0000 mL | Freq: Once | INTRAMUSCULAR | Status: AC | PRN
  Administered 2021-06-01: 100 mL via INTRAVENOUS

## 2021-06-01 MED ORDER — OMEPRAZOLE MAGNESIUM 20 MG PO TBEC: 20.0000 mg | DELAYED_RELEASE_TABLET | Freq: Every day | ORAL | 1 refills | Status: DC

## 2021-06-01 MED ORDER — IOHEXOL 350 MG/ML SOLN: 100.0000 mL | Freq: Once | INTRAVENOUS | Status: DC | PRN

## 2021-06-01 MED ORDER — LACTATED RINGERS IV BOLUS
1000.0000 mL | Freq: Once | INTRAVENOUS | Status: AC
Start: 2021-06-01 — End: 2021-06-01
  Administered 2021-06-01: 1000 mL via INTRAVENOUS

## 2021-06-01 MED ORDER — MORPHINE SULFATE (PF) 4 MG/ML IV SOLN
4.0000 mg | Freq: Once | INTRAVENOUS | Status: AC
  Administered 2021-06-01: 4 mg via INTRAVENOUS
  Filled 2021-06-01: qty 1

## 2021-06-01 MED ORDER — ONDANSETRON HCL 4 MG/2ML IJ SOLN
4.0000 mg | Freq: Once | INTRAMUSCULAR | Status: AC
  Administered 2021-06-01: 4 mg via INTRAVENOUS
  Filled 2021-06-01: qty 2

## 2021-06-01 MED ORDER — LIDOCAINE VISCOUS HCL 2 % MT SOLN: 15.0000 mL | OROMUCOSAL | 0 refills | Status: DC | PRN

## 2021-06-01 NOTE — ED Triage Notes (Signed)
See paper chart for downtime 

## 2021-06-01 NOTE — Discharge Instructions (Signed)
You should stop taking ibuprofen as this could make your symptoms worse.  You may take your previously prescribed tramadol as needed as well as liquid lidocaine.  Please start taking omeprazole and take this on a daily basis whether you are feeling better or not.  Please schedule follow-up with your GI doctor and return to the ED for new or worsening symptoms.

## 2021-06-01 NOTE — ED Provider Notes (Signed)
[facility] Provider Note    Event Date/Time   First MD Initiated Contact with Patient 06/01/21 [phone]     (approximate)   History   Chief Complaint Abdominal Pain   HPI  Jenna Tyler is a 56 y.o. female with past medical history of hypertension, COPD, pancreatitis, neurofibromatosis type I, chronic pain syndrome, and opiate use disorder who presents to the ED complaining of abdominal pain.  Patient reports that she has had 3 to 4 days of gradually worsening pain in her epigastrium and left upper quadrant.  She states the pain started as intermittent and worse when she would try to eat, has now become constant.  She describes the pain as sharp and severe, associated with some nausea and diarrhea, but no vomiting.  She has not had any fevers, cough, chest pain, shortness of breath, dysuria, or flank pain.  She describes symptoms as similar to prior episodes of pancreatitis.  She denies any recent alcohol consumption, has been sober for 4 years.  She does report that she has been taking 800 mg of ibuprofen about twice a day for the past couple of days with minimal relief of pain.     Physical Exam   Triage Vital Signs: ED Triage Vitals  Enc Vitals Group     BP      Pulse      Resp      Temp      Temp src      SpO2      Weight      Height      Head Circumference      Peak Flow      Pain Score      Pain Loc      Pain Edu?      Excl. in [place]?     Most recent vital signs: Vitals:   06/01/21 0800 06/01/21 0900  BP:  128/84  Pulse: 98 93  Resp: 15 17  Temp:    SpO2: 93% 99%    Constitutional: Alert and oriented. Eyes: Conjunctivae are normal. Head: Atraumatic. Nose: No congestion/rhinnorhea. Mouth/Throat: Mucous membranes are moist. Cardiovascular: Normal rate, regular rhythm. Grossly normal heart sounds.  2+ radial pulses bilaterally. Respiratory: Normal respiratory effort.  No retractions. Lungs CTAB. Gastrointestinal: Soft and tender  to palpation in the left upper quadrant and epigastrium with no rebound or guarding. No distention. Musculoskeletal: No lower extremity tenderness nor edema.  Neurologic:  Normal speech and language. No gross focal neurologic deficits are appreciated.    ED Results / Procedures / Treatments   Labs (all labs ordered are listed, but only abnormal results are displayed) Labs Reviewed  CBC WITH DIFFERENTIAL/PLATELET - Abnormal; Notable for the following components:      Result Value   RBC 6.07 (*)    Hemoglobin 16.4 (*)    HCT 49.2 (*)    All other components within normal limits  COMPREHENSIVE METABOLIC PANEL - Abnormal; Notable for the following components:   Sodium 134 (*)    Glucose, Bld 152 (*)    Creatinine, Ser 1.02 (*)    All other components within normal limits  URINALYSIS, ROUTINE W REFLEX MICROSCOPIC - Abnormal; Notable for the following components:   Color, Urine YELLOW (*)    APPearance HAZY (*)    Bilirubin Urine SMALL (*)    Leukocytes,Ua TRACE (*)    All other components within normal limits  LIPASE, BLOOD     EKG  ED ECG  REPORT I, Blake Divine, the attending physician, personally viewed and interpreted this ECG.   Date: 06/01/2021  EKG Time: 8:05  Rate: 94  Rhythm: normal sinus rhythm  Axis: Normal  Intervals:none  ST&T Change: None  RADIOLOGY CT of abdomen/pelvis reviewed by me with no obvious inflammatory changes or dilated bowel loops.  PROCEDURES:  Critical Care performed: No  Procedures   MEDICATIONS ORDERED IN ED: Medications  iohexol (OMNIPAQUE) 350 MG/ML injection 100 mL (has no administration in time range)  morphine (PF) 4 MG/ML injection 4 mg (4 mg Intravenous Given 06/01/21 0810)  ondansetron (ZOFRAN) injection 4 mg (4 mg Intravenous Given 06/01/21 0809)  lactated ringers bolus 1,000 mL (1,000 mLs Intravenous New Bag/Given 06/01/21 0811)  iohexol (OMNIPAQUE) 300 MG/ML solution 100 mL (100 mLs Intravenous Contrast Given 06/01/21 6063)      IMPRESSION / MDM / ASSESSMENT AND PLAN / ED COURSE  I reviewed the triage vital signs and the nursing notes.                              56 y.o. female with past medical history of hypertension, COPD, pancreatitis, neurofibromatosis type I, peptic ulcer disease, chronic pain syndrome, and opiate use disorder who presents to the ED complaining of 3 to 4 days of worsening epigastric and left upper quadrant abdominal pain that has now become constant pain worse with eating.  Differential diagnosis includes, but is not limited to, pancreatitis, hepatitis, biliary obstruction, gastritis, peptic ulcer disease, gastroenteritis.  Patient is nontoxic-appearing and in no acute distress, vital signs are reassuring.  She does have tenderness to palpation in her abdomen in both the epigastrium and left upper quadrant.  Lab results were delayed due to downtime and CBC, CMP, and lipase levels are pending.  We will further assess with CT scan, treat symptomatically with IV morphine and Zofran, hydrate with IV fluids.  CBC is reassuring with no anemia or leukocytosis, CMP shows stable renal function with LFTs within normal limits, lipase is also unremarkable.  CT scan is negative for acute process, no inflammatory changes noted in the area of her pancreas and no evidence to suggest biliary obstruction.  Suspect patient's symptoms are due to gastritis versus recurrent PUD, likely exacerbated by her NSAID use.  She was counseled to stop ibuprofen and will be prescribed omeprazole along with viscous lidocaine to take as needed.  She was counseled to return to the ED for new worsening symptoms, otherwise schedule follow-up with her GI provider.  Patient agrees with plan.      FINAL CLINICAL IMPRESSION(S) / ED DIAGNOSES   Final diagnoses:  Epigastric pain  Acute gastritis without hemorrhage, unspecified gastritis type     Rx / DC Orders   ED Discharge Orders          Ordered    omeprazole (PRILOSEC  OTC) 20 MG tablet  Daily        06/01/21 0934    lidocaine (XYLOCAINE) 2 % solution  As needed        06/01/21 0160             Note:  This document was prepared using Dragon voice recognition software and may include unintentional dictation errors.   Blake Divine, MD 06/01/21 [phone]

## 2021-06-01 NOTE — ED Notes (Signed)
Pt reports pancreatitis flare up. +nausea, diarrhea. Reports no ETOH use for 4 years. NAD noted.

## 2021-08-29 ENCOUNTER — Other Ambulatory Visit: Payer: Self-pay

## 2021-08-29 ENCOUNTER — Emergency Department
Admission: EM | Admit: 2021-08-29 | Discharge: 2021-08-30 | Disposition: A | Discharge status: Home / Self Care | Payer: Medicaid Other | Attending: Emergency Medicine | Admitting: Emergency Medicine

## 2021-08-29 ENCOUNTER — Emergency Department: Payer: Medicaid Other

## 2021-08-29 ENCOUNTER — Encounter: Payer: Self-pay | Admitting: Emergency Medicine

## 2021-08-29 DIAGNOSIS — R531 Weakness: Secondary | ICD-10-CM | POA: Insufficient documentation

## 2021-08-29 DIAGNOSIS — R42 Dizziness and giddiness: Secondary | ICD-10-CM | POA: Insufficient documentation

## 2021-08-29 DIAGNOSIS — R202 Paresthesia of skin: Secondary | ICD-10-CM | POA: Insufficient documentation

## 2021-08-29 LAB — BASIC METABOLIC PANEL
Anion gap: 7 (ref 5–15)
BUN: 11 mg/dL (ref 6–20)
CO2: 26 mmol/L (ref 22–32)
Calcium: 10.4 mg/dL — ABNORMAL HIGH (ref 8.9–10.3)
Chloride: 106 mmol/L (ref 98–111)
Creatinine, Ser: 0.84 mg/dL (ref 0.44–1.00)
GFR, Estimated: >60 mL/min (ref >60)
Glucose, Bld: 103 mg/dL — ABNORMAL HIGH (ref 70–99)
Potassium: 5.1 mmol/L (ref 3.5–5.1)
Sodium: 139 mmol/L (ref 135–145)

## 2021-08-29 LAB — URINALYSIS, ROUTINE W REFLEX MICROSCOPIC
Bilirubin Urine: NEGATIVE
Glucose, UA: NEGATIVE mg/dL
Hgb urine dipstick: NEGATIVE
Ketones, ur: NEGATIVE mg/dL
Leukocytes,Ua: NEGATIVE
Nitrite: NEGATIVE
Protein, ur: NEGATIVE mg/dL
Specific Gravity, Urine: 1.015 (ref 1.005–1.030)
pH: 5 (ref 5.0–8.0)

## 2021-08-29 LAB — CBC
HCT: 50.8 % — ABNORMAL HIGH (ref 36.0–46.0)
Hemoglobin: 16.9 g/dL — ABNORMAL HIGH (ref 12.0–15.0)
MCH: 26.2 pg (ref 26.0–34.0)
MCHC: 33.3 g/dL (ref 30.0–36.0)
MCV: 78.8 fL — ABNORMAL LOW (ref 80.0–100.0)
Platelets: 356 10*3/uL (ref 150–400)
RBC: 6.45 MIL/uL — ABNORMAL HIGH (ref 3.87–5.11)
RDW: 14.2 % (ref 11.5–15.5)
WBC: 11.2 10*3/uL — ABNORMAL HIGH (ref 4.0–10.5)
nRBC: 0.2 % (ref 0.0–0.2)

## 2021-08-29 MED ORDER — BUTALBITAL-APAP-CAFFEINE 50-325-40 MG PO TABS
1.0000 | ORAL_TABLET | Freq: Once | ORAL | Status: AC
  Administered 2021-08-29: 1 via ORAL
  Filled 2021-08-29: qty 1

## 2021-08-29 MED ORDER — LORAZEPAM 2 MG/ML IJ SOLN
1.0000 mg | Freq: Once | INTRAMUSCULAR | Status: AC
  Administered 2021-08-29: 1 mg via INTRAVENOUS
  Filled 2021-08-29: qty 1

## 2021-08-29 NOTE — ED Provider Notes (Signed)
? ?[facility] ?Provider Note ? ? ? Event Date/Time  ? First MD Initiated Contact with Patient 08/29/21 2159   ?  (approximate) ? ? ?History  ? ?Near Syncope ? ? ?HPI ? ?Jenna Tyler is a 56 y.o. female  who per clinic note dated 06/29/21 who was being seen at that time for worsening paresthesis and leg weakness, who presents to the emergency department today because of an episode of memory loss and dizziness.  The patient states that she started having episodes of dizziness yesterday.  They would come and go.  She did then remember that she was in the kitchen preparing food when the next thing she knew she was staying by the freezer.  She does not remember how she got to the freezer.  Additionally things have been done in the kitchen that she did not remember doing during that time.  She thinks that this whole episode lasted on the order of minutes.  The patient denies any further episodes of memory lapse.  However continues to have the dizziness.  It does seem to be somewhat worse with changing positions.  Patient also complains of headache. ? ?  ? ?Physical Exam  ? ?Triage Vital Signs: ?ED Triage Vitals [08/29/21 1824]  ?Enc Vitals Group  ?   BP (!) 157/107  ?   Pulse Rate 88  ?   Resp 17  ?   Temp 98.1 ?F (36.7 ?C)  ?   Temp Source Oral  ?   SpO2 100 %  ?   Weight 184 lb (83.5 kg)  ?   Height '5\' 4"'$  (1.626 m)  ?   Head Circumference   ?   Peak Flow   ?   Pain Score 6  ?   Pain Loc   ?   Pain Edu?   ?   Excl. in [place]?   ? ? ?Most recent vital signs: ?Vitals:  ? 08/29/21 2135 08/29/21 2140  ?BP:  (!) 156/111  ?Pulse: 78 84  ?Resp: 19 (!) 21  ?Temp:    ?SpO2: 98% 96%  ? ? ?General: Awake, alert and oriented. ?CV:  Good peripheral perfusion. Regular rate and rhythm. ?Resp:  Normal effort. Clear to auscultation. ?Abd:  No distention. Non tender to palpation.  ?Neuro:  PERRL, EOMI, strength 5/5 in upper extremities. Strength 4/5 in lower extremities, however patient states that it her baseline  in the right leg, felt slightly weaker in the left. Sensation grossly intact.  ? ? ?ED Results / Procedures / Treatments  ? ?Labs ?(all labs ordered are listed, but only abnormal results are displayed) ?Labs Reviewed  ?BASIC METABOLIC PANEL - Abnormal; Notable for the following components:  ?    Result Value  ? Glucose, Bld 103 (*)   ? Calcium 10.4 (*)   ? All other components within normal limits  ?CBC - Abnormal; Notable for the following components:  ? WBC 11.2 (*)   ? RBC 6.45 (*)   ? Hemoglobin 16.9 (*)   ? HCT 50.8 (*)   ? MCV 78.8 (*)   ? All other components within normal limits  ?URINALYSIS, ROUTINE W REFLEX MICROSCOPIC  ?CBG MONITORING, ED  ? ? ? ?EKG ? ?INance Pear, attending physician, personally viewed and interpreted this EKG ? ?EKG Time: 1830 ?Rate: 82 ?Rhythm: normal sinus rhythm ?Axis: normal ?Intervals: qtc 420 ?QRS: narrow, low voltage qrs ?ST changes: no st elevation ?Impression: abnormal ekg ? ?RADIOLOGY ?I independently interpreted  and visualized the CT head. My interpretation: No large bleed or mass ?Radiology interpretation:  ?IMPRESSION:  ?No acute intracranial process.  ? ? ?PROCEDURES: ? ?Critical Care performed: No ? ?Procedures ? ? ?MEDICATIONS ORDERED IN ED: ?Medications - No data to display ? ? ?IMPRESSION / MDM / ASSESSMENT AND PLAN / ED COURSE  ?I reviewed the triage vital signs and the nursing notes. ?             ?               ? ?Differential diagnosis includes, but is not limited to, TIA, BPPV, intracranial bleed. ? ?Patient presented to the emergency department today because of concerns for an episode of memory loss as well as dizziness.  Symptoms have been present for roughly 24 hours.  On exam patient is neuro exam is fairly reassuring.  She does have some weakness in her lower extremities although she states she has weakness at baseline.  She feels her left leg is slightly weaker than it typically is however.  CT scan did not show any concerning acute findings.  Given  concern for central lesion however MRI was ordered.  Patient was given medication to help with headache. ? ?FINAL CLINICAL IMPRESSION(S) / ED DIAGNOSES  ? ?Final diagnoses:  ?Dizziness  ? ? ? ?Note:  This document was prepared using Dragon voice recognition software and may include unintentional dictation errors. ? ?  ?Nance Pear, MD ?08/29/21 2332 ? ?

## 2021-08-29 NOTE — ED Triage Notes (Signed)
Pt presents via POV with c/o near syncopal episodes that started about 24 hours. Pt reports associated with dizziness and headaches. Pt reports she did not loose consciousness or hit head. Pt reports not remembering she put food bowl on table during event-reports as if she "lost track of time". Pt reports still dizzy and describes headache as pounding.  ?

## 2021-08-29 NOTE — ED Provider Triage Note (Signed)
Emergency Medicine Provider Triage Evaluation Note ? ?Jenna Tyler , a 56 y.o. female  was evaluated in triage.  Pt complains of headache/dizziness/presyncopal episode. ?Reports that for the last 2 to 3 days she has had significant amounts of dizziness. ?Patient reports that yesterday evening she started with increased dizziness and "severe" headache.  She then reports she was standing in the kitchen performing routine task and "lost time".  He is unable to determine the amount of time that she cannot account for. ?She reports since that time that her headache has persisted as well as the dizziness.  She denies any nausea or vomiting.  She denies chest pain, shortness of breath.  She denies any falls.  She states that during the period of time but she cannot account for when she came to she was standing hunched over her kitchen counter. ?She denies any vision changes. ? ?Review of Systems  ?Positive: Dizziness/headache/presyncopal episode. ?Negative: Nausea/vomiting/chest pain/shortness of breath/vision changes ? ?Physical Exam  ?BP (!) 157/107   Pulse 88   Temp 98.1 ?F (36.7 ?C) (Oral)   Resp 17   Ht '5\' 4"'$  (1.626 m)   Wt 83.5 kg   SpO2 100%   BMI 31.58 kg/m?  ?Gen:   Awake, no distress patient is alert and oriented x3.  She does appear neurologically intact at this time.  She shows no signs of facial droop.  Handgrips are equal. ?Resp:  Normal effort  ?MSK:   Moves extremities without difficulty  ?Other:  She does complain of headache/dizziness. ? ?Medical Decision Making  ?Medically screening exam initiated at 6:27 PM.  Appropriate orders placed.  Jenna Tyler was informed that the remainder of the evaluation will be completed by another provider, this initial triage assessment does not replace that evaluation, and the importance of remaining in the ED until their evaluation is complete. ? ?Patient will have presyncopal triage protocol initiated as well as ordering a CT of the head. ?   ?Willaim Rayas, NP ?08/29/21 1830 ? ?

## 2021-08-29 NOTE — ED Notes (Signed)
Pt to mri after medication. Pt in mri at this time ? ?

## 2021-08-30 MED ORDER — MECLIZINE HCL 25 MG PO TABS: 25.0000 mg | ORAL_TABLET | Freq: Three times a day (TID) | ORAL | 0 refills | Status: DC | PRN

## 2021-08-30 NOTE — ED Provider Notes (Signed)
Patient received in signout from Dr. Archie Balboa pending MRI brain to evaluate for a an episode of memory loss and dizziness, no evidence of acute intracranial pathology such as CVA.  We discussed possible etiologies of her positional vertiginous dizziness such as BPPV.  We discussed meclizine and following up with ENT.  Return precautions for the ED discussed.  Patient suitable for outpatient management. ?  ?Jenna Crofts, MD ?08/30/21 [phone] ? ?

## 2021-10-03 ENCOUNTER — Encounter: Payer: Self-pay | Admitting: Intensive Care

## 2021-10-03 ENCOUNTER — Emergency Department: Payer: Medicaid Other

## 2021-10-03 ENCOUNTER — Observation Stay
Admission: EM | Admit: 2021-10-03 | Discharge: 2021-10-05 | Disposition: A | Discharge status: Home / Self Care | Payer: Medicaid Other | Attending: Internal Medicine | Admitting: Internal Medicine

## 2021-10-03 ENCOUNTER — Other Ambulatory Visit: Payer: Self-pay

## 2021-10-03 DIAGNOSIS — A419 Sepsis, unspecified organism: Secondary | ICD-10-CM | POA: Insufficient documentation

## 2021-10-03 DIAGNOSIS — F1091 Alcohol use, unspecified, in remission: Secondary | ICD-10-CM | POA: Diagnosis present

## 2021-10-03 DIAGNOSIS — Z20822 Contact with and (suspected) exposure to covid-19: Secondary | ICD-10-CM | POA: Diagnosis not present

## 2021-10-03 DIAGNOSIS — J441 Chronic obstructive pulmonary disease with (acute) exacerbation: Secondary | ICD-10-CM | POA: Diagnosis present

## 2021-10-03 DIAGNOSIS — E039 Hypothyroidism, unspecified: Secondary | ICD-10-CM | POA: Diagnosis present

## 2021-10-03 DIAGNOSIS — J9601 Acute respiratory failure with hypoxia: Secondary | ICD-10-CM | POA: Diagnosis present

## 2021-10-03 DIAGNOSIS — R0602 Shortness of breath: Principal | ICD-10-CM | POA: Diagnosis present

## 2021-10-03 DIAGNOSIS — J189 Pneumonia, unspecified organism: Secondary | ICD-10-CM | POA: Insufficient documentation

## 2021-10-03 DIAGNOSIS — Z87891 Personal history of nicotine dependence: Secondary | ICD-10-CM | POA: Diagnosis not present

## 2021-10-03 DIAGNOSIS — Z79899 Other long term (current) drug therapy: Secondary | ICD-10-CM | POA: Diagnosis not present

## 2021-10-03 DIAGNOSIS — I10 Essential (primary) hypertension: Secondary | ICD-10-CM | POA: Diagnosis not present

## 2021-10-03 DIAGNOSIS — J45909 Unspecified asthma, uncomplicated: Secondary | ICD-10-CM | POA: Insufficient documentation

## 2021-10-03 DIAGNOSIS — F418 Other specified anxiety disorders: Secondary | ICD-10-CM | POA: Diagnosis present

## 2021-10-03 DIAGNOSIS — R051 Acute cough: Secondary | ICD-10-CM | POA: Insufficient documentation

## 2021-10-03 DIAGNOSIS — K269 Duodenal ulcer, unspecified as acute or chronic, without hemorrhage or perforation: Secondary | ICD-10-CM | POA: Diagnosis present

## 2021-10-03 DIAGNOSIS — R6520 Severe sepsis without septic shock: Secondary | ICD-10-CM | POA: Diagnosis not present

## 2021-10-03 DIAGNOSIS — F1020 Alcohol dependence, uncomplicated: Secondary | ICD-10-CM | POA: Diagnosis present

## 2021-10-03 LAB — D-DIMER, QUANTITATIVE: D-Dimer, Quant: <0.27 ug/mL-FEU (ref 0.00–0.50)

## 2021-10-03 LAB — COMPREHENSIVE METABOLIC PANEL
ALT: 38 U/L (ref 0–44)
AST: 27 U/L (ref 15–41)
Albumin: 4.1 g/dL (ref 3.5–5.0)
Alkaline Phosphatase: 121 U/L (ref 38–126)
Anion gap: 11 (ref 5–15)
BUN: 22 mg/dL — ABNORMAL HIGH (ref 6–20)
CO2: 22 mmol/L (ref 22–32)
Calcium: 9.5 mg/dL (ref 8.9–10.3)
Chloride: 104 mmol/L (ref 98–111)
Creatinine, Ser: 0.9 mg/dL (ref 0.44–1.00)
GFR, Estimated: >60 mL/min (ref >60)
Glucose, Bld: 184 mg/dL — ABNORMAL HIGH (ref 70–99)
Potassium: 4 mmol/L (ref 3.5–5.1)
Sodium: 137 mmol/L (ref 135–145)
Total Bilirubin: 0.5 mg/dL (ref 0.3–1.2)
Total Protein: 7.5 g/dL (ref 6.5–8.1)

## 2021-10-03 LAB — CBC WITH DIFFERENTIAL/PLATELET
Abs Immature Granulocytes: 0.12 10*3/uL — ABNORMAL HIGH (ref 0.00–0.07)
Basophils Absolute: 0 10*3/uL (ref 0.0–0.1)
Basophils Relative: 0 %
Eosinophils Absolute: 0 10*3/uL (ref 0.0–0.5)
Eosinophils Relative: 0 %
HCT: 48.8 % — ABNORMAL HIGH (ref 36.0–46.0)
Hemoglobin: 16.3 g/dL — ABNORMAL HIGH (ref 12.0–15.0)
Immature Granulocytes: 1 %
Lymphocytes Relative: 10 %
Lymphs Abs: 1.4 10*3/uL (ref 0.7–4.0)
MCH: 26.8 pg (ref 26.0–34.0)
MCHC: 33.4 g/dL (ref 30.0–36.0)
MCV: 80.3 fL (ref 80.0–100.0)
Monocytes Absolute: 0.4 10*3/uL (ref 0.1–1.0)
Monocytes Relative: 3 %
Neutro Abs: 12.4 10*3/uL — ABNORMAL HIGH (ref 1.7–7.7)
Neutrophils Relative %: 86 %
Platelets: 309 10*3/uL (ref 150–400)
RBC: 6.08 MIL/uL — ABNORMAL HIGH (ref 3.87–5.11)
RDW: 14.6 % (ref 11.5–15.5)
WBC: 14.4 10*3/uL — ABNORMAL HIGH (ref 4.0–10.5)
nRBC: 0 % (ref 0.0–0.2)

## 2021-10-03 LAB — TROPONIN I (HIGH SENSITIVITY)
Troponin I (High Sensitivity): <2 ng/L (ref <18)
Troponin I (High Sensitivity): 3 ng/L (ref <18)

## 2021-10-03 LAB — SARS CORONAVIRUS 2 BY RT PCR: SARS Coronavirus 2 by RT PCR: NEGATIVE

## 2021-10-03 MED ORDER — HYDROCOD POLI-CHLORPHE POLI ER 10-8 MG/5ML PO SUER
5.0000 mL | Freq: Once | ORAL | Status: AC
  Administered 2021-10-03: 5 mL via ORAL
  Filled 2021-10-03: qty 5

## 2021-10-03 MED ORDER — BENZONATATE 100 MG PO CAPS
100.0000 mg | ORAL_CAPSULE | Freq: Once | ORAL | Status: AC
  Administered 2021-10-03: 100 mg via ORAL
  Filled 2021-10-03: qty 1

## 2021-10-03 MED ORDER — SODIUM CHLORIDE 0.9 % IV BOLUS
1000.0000 mL | Freq: Once | INTRAVENOUS | Status: AC
  Administered 2021-10-03: 1000 mL via INTRAVENOUS

## 2021-10-03 MED ORDER — GUAIFENESIN 100 MG/5ML PO LIQD
5.0000 mL | Freq: Once | ORAL | Status: AC
  Administered 2021-10-03: 5 mL via ORAL
  Filled 2021-10-03: qty 10

## 2021-10-03 MED ORDER — IPRATROPIUM-ALBUTEROL 0.5-2.5 (3) MG/3ML IN SOLN
3.0000 mL | Freq: Once | RESPIRATORY_TRACT | Status: AC
  Administered 2021-10-03: 3 mL via RESPIRATORY_TRACT
  Filled 2021-10-03: qty 3

## 2021-10-03 MED ORDER — AZITHROMYCIN 500 MG PO TABS
500.0000 mg | ORAL_TABLET | Freq: Once | ORAL | Status: AC
  Administered 2021-10-04: 500 mg via ORAL
  Filled 2021-10-03: qty 1

## 2021-10-03 NOTE — ED Triage Notes (Signed)
Patient presents with constant cough and sob. [facility] sent patient here for COPD exacerbation.

## 2021-10-03 NOTE — ED Notes (Signed)
Patient refused chest xray. She reports she had one completed at [facility] before coming here

## 2021-10-03 NOTE — ED Provider Notes (Addendum)
[facility] Provider Note    Event Date/Time   First MD Initiated Contact with Patient 10/03/21 2006     (approximate)   History   Cough and COPD   HPI  Jenna Tyler is a 56 y.o. female past medical history of COPD hypothyroidism hypertension hyperlipidemia neurofibromatosis who presents with cough and shortness of breath.  Patient symptoms started today.  She endorses uncontrolled cough making it difficult for her to breathe having some mild chest pain due to to coughing.  She notes that she has had significant sweats but denies fevers.  No nausea vomiting or abdominal pain.  No lower extremity edema.    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   Pancreatitis     Patient Active Problem List   Diagnosis Date Noted   Acute respiratory failure with hypoxia ([place]) 07/17/2020   Chest wall pain 07/17/2020   Abdominal pain 04/07/2020   Adaptation reaction 07/14/2019   Allergic rhinitis, seasonal 07/14/2019   Breast lump 07/14/2019   Cephalalgia 07/14/2019   Chronic pain associated with significant psychosocial dysfunction 07/14/2019   Encounter for screening for lipoid disorders 07/14/2019   Feeling bilious 07/14/2019   Feeling stressed out 07/14/2019   Immunization, tetanus toxoid 07/14/2019   Infection of the upper respiratory tract 07/14/2019   Urethra, 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   Uterine spasm 07/14/2019   Breath shortness 07/14/2019   Breathlessness on exertion 07/14/2019   SOB (shortness of breath)    Sepsis without septic shock ([place]) 07/10/2019   Easy bruising 07/02/2019   Polycythemia 07/02/2019   Tremor 05/29/2019   Cognitive decline 09/29/2018   CAP (community acquired pneumonia) 08/28/2018   Seizures ([place]) 01/15/2018   Leg  pain 10/18/2017   Lower extremity weakness 10/18/2017   Weakness 09/16/2017   Lower extremity pain, bilateral 09/10/2017   Other symptoms and signs involving the musculoskeletal system 07/26/2017   AKI (acute kidney injury) ([place]) 07/12/2017   HTN (hypertension) 12/29/2016   COPD exacerbation ([place]) 12/29/2016   Substance induced mood disorder ([place]) 12/29/2016   Alcohol use disorder, severe, dependence ([place]) 12/27/2016   Major depressive disorder, 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   Vomiting of fecal matter 08/15/2016   Globus sensation 08/15/2016   Multiple somatic complaints 08/15/2016   Multiple duodenal ulcers 01/24/2016   Prediabetes 07/07/2015   Neurofibromatosis, 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   Acquired hypothyroidism 04/04/2015   Pre-diabetes 04/04/2015   Vitamin D deficiency 04/04/2015   Borderline diabetes mellitus 04/04/2015   Anxiety 10/12/2014   Anxiety, generalized 10/12/2014   H/O: hypothyroidism 10/12/2014   Insomnia, persistent 10/12/2014   Cannot sleep 10/12/2014   Depression, major, recurrent, moderate ([place]) 10/12/2014   Drug abuse, opioid type ([place]) 10/12/2014   Nondependent opioid abuse in remission ([place]) 10/12/2014   Generalized anxiety disorder 10/12/2014   Moderate episode of recurrent major depressive disorder ([place]) 10/12/2014   Opioid abuse ([place]) 10/12/2014   History of hypothyroidism 10/12/2014   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   Severe protein-calorie malnutrition ([place]) 07/30/2014   Nausea & vomiting 07/07/2014   Hypothyroidism, unspecified 07/07/2014   Depression with anxiety 07/07/2014   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/2013   Lipoma of abdominal wall 09/15/2013   Pleurisy 08/17/2010   Vomiting  and diarrhea 01/23/2010   Cough 11/24/2009   Postmenopausal atrophic vaginitis 11/21/2009   Lumbar back sprain 10/10/2009   Clinical depression 07/12/2009   Arthralgia of multiple joints 06/30/2009   Benign neoplasm  of thyroid gland 05/27/2009   Fast heart beat 05/10/2009   Persistent insomnia 02/07/2009   Mixed disorder as reaction to stress 10/09/2008   Boil of eyelid 08/01/2007   Chest pain on breathing 06/03/2007   LBP (low back pain) 03/17/2007   Epigastric pain 11/27/2006   Hypoglycemia 11/06/2005   Addiction, opium ([place]) 04/23/2002   Headache, migraine 04/23/1998   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 --      Excl. in [place]? --     Most recent vital signs: Vitals:   10/03/21 2130 10/03/21 2200  BP: 135/79 131/77  Pulse: (!) 117 (!) 119  Resp: 20 19  Temp:    SpO2: 99% 100%     General: Awake, no distress.  CV:  Good peripheral perfusion. No edema Resp:  Patient has no increased work of breathing however she is coughing quite frequently can only speak several words without coughing Abd:  No distention.  Neuro:             Awake, Alert, Oriented x 3  Other:     ED Results / Procedures / Treatments  Labs (all labs ordered are listed, but only abnormal results are displayed) Labs Reviewed  CBC WITH DIFFERENTIAL/PLATELET - Abnormal; Notable for the following components:      Result Value   WBC 14.4 (*)    RBC 6.08 (*)    Hemoglobin 16.3 (*)    HCT 48.8 (*)    Neutro Abs 12.4 (*)    Abs Immature Granulocytes 0.12 (*)    All other components within normal limits  COMPREHENSIVE METABOLIC PANEL - Abnormal; Notable for the following components:   Glucose, Bld 184 (*)    BUN 22 (*)    All  other components within normal limits  SARS CORONAVIRUS 2 BY RT PCR  CULTURE, BLOOD (ROUTINE X 2)  CULTURE, BLOOD (ROUTINE X 2)  D-DIMER, QUANTITATIVE  PROCALCITONIN  LACTIC ACID, PLASMA  LACTIC ACID, PLASMA  TROPONIN I (HIGH SENSITIVITY)  TROPONIN I (HIGH SENSITIVITY)     EKG  EKG reviewed and interpreted by myself shows sinus tachycardia normal intervals no acute ischemic changes   RADIOLOGY I reviewed and interpreted the CXR which does not show any acute cardiopulmonary process    PROCEDURES:  Critical Care performed: No  .1-3 Lead EKG Interpretation  Performed by: Rada Hay, MD Authorized by: Rada Hay, MD     ECG rate assessment: tachycardic     Rhythm: sinus tachycardia  Ectopy: none     Conduction: normal     The patient is on the cardiac monitor to evaluate for evidence of arrhythmia and/or significant heart rate changes.   MEDICATIONS ORDERED IN ED: Medications  azithromycin (ZITHROMAX) tablet 500 mg (has no administration in time range)  guaiFENesin (ROBITUSSIN) 100 MG/5ML liquid 5 mL (5 mLs Oral Given 10/03/21 1726)  sodium chloride 0.9 % bolus 1,000 mL (0 mLs Intravenous Stopped 10/03/21 2247)  benzonatate (TESSALON) capsule 100 mg (100 mg Oral Given 10/03/21 2053)  ipratropium-albuterol (DUONEB) 0.5-2.5 (3) MG/3ML nebulizer solution 3 mL (3 mLs Nebulization Given 10/03/21 2054)  sodium chloride 0.9 % bolus 1,000 mL (1,000 mLs Intravenous New Bag/Given 10/03/21 2249)  chlorpheniramine-HYDROcodone 10-8 MG/5ML suspension 5 mL (5 mLs Oral Given 10/03/21 2251)     IMPRESSION / MDM / ASSESSMENT AND PLAN / ED COURSE  I reviewed the triage vital signs and the nursing notes.                              Patient's presentation is most consistent with acute presentation with potential threat to life or bodily function.  Differential diagnosis includes, but is not limited to, viral illness, acute exacerbation of COPD, pneumonia, CHF  Is a  56 year old female with past medical history of COPD presents with cough difficulty breathing.  Patient is coughing significantly on my evaluation has difficulty talking secondary to cough.  Has had some sweats but no fevers.  She is complaining of dyspnea and mild chest pain secondary to the coughing.  No lower extremity edema.  Is tachycardic and tachypneic but not hypoxic chest x-ray does not have any acute infiltrate or other process.  Labs are notable for leukocytosis to 14 hemoglobin also elevated at 16 suspect hemoconcentration so we will give a liter of fluid.  CMP troponin negative.  Did obtain a D-dimer given the tachycardia and dyspnea this is negative.  She is low risk by [place] so feel that this rules out PE.  Plan to treat as a COPD exacerbation I think this will also help with her cough she ready received Solu-Medrol [facility] we will give a DuoNeb fluid bolus and try [employer] as patient says this is helped cough for her in the past.  After a liter of fluid and benzonatate patient feels somewhat improved she is still persistently tachycardic.  She got up to use the restroom and is having significant coughing again.  We will give another liter of fluid and try Tussionex cough syrup.  Patient signed out pending reassessment after fluids.  Patient persistently tachycardic.  Does need sepsis criteria with leukocytosis tachycardia and tachypnea.  Will give azithromycin for potential atypical infection.  We will send Pro-Cal lactate and blood cultures as well.  Will admit to the hospital service.       FINAL CLINICAL IMPRESSION(S) / ED DIAGNOSES   Final diagnoses:  Acute cough  COPD exacerbation ([place])     Rx / DC Orders   ED Discharge Orders     None        Note:  This document was prepared using Dragon voice recognition software and may include unintentional dictation errors.   Rada Hay, MD 10/03/21 2303    Rada Hay, MD 10/03/21 2350

## 2021-10-04 ENCOUNTER — Observation Stay: Payer: Medicaid Other

## 2021-10-04 ENCOUNTER — Encounter: Payer: Self-pay | Admitting: Internal Medicine

## 2021-10-04 DIAGNOSIS — R0602 Shortness of breath: Secondary | ICD-10-CM | POA: Diagnosis not present

## 2021-10-04 LAB — TYPE AND SCREEN
ABO/RH(D): O POS
Antibody Screen: NEGATIVE

## 2021-10-04 LAB — CBC
HCT: 42.6 % (ref 36.0–46.0)
Hemoglobin: 14.2 g/dL (ref 12.0–15.0)
MCH: 26.5 pg (ref 26.0–34.0)
MCHC: 33.3 g/dL (ref 30.0–36.0)
MCV: 79.6 fL — ABNORMAL LOW (ref 80.0–100.0)
Platelets: 283 10*3/uL (ref 150–400)
RBC: 5.35 MIL/uL — ABNORMAL HIGH (ref 3.87–5.11)
RDW: 14.7 % (ref 11.5–15.5)
WBC: 10.6 10*3/uL — ABNORMAL HIGH (ref 4.0–10.5)
nRBC: 0 % (ref 0.0–0.2)

## 2021-10-04 LAB — BASIC METABOLIC PANEL
Anion gap: 9 (ref 5–15)
BUN: 21 mg/dL — ABNORMAL HIGH (ref 6–20)
CO2: 19 mmol/L — ABNORMAL LOW (ref 22–32)
Calcium: 8.8 mg/dL — ABNORMAL LOW (ref 8.9–10.3)
Chloride: 108 mmol/L (ref 98–111)
Creatinine, Ser: 0.71 mg/dL (ref 0.44–1.00)
GFR, Estimated: >60 mL/min (ref >60)
Glucose, Bld: 191 mg/dL — ABNORMAL HIGH (ref 70–99)
Potassium: 3.9 mmol/L (ref 3.5–5.1)
Sodium: 136 mmol/L (ref 135–145)

## 2021-10-04 LAB — TSH: TSH: 0.061 u[IU]/mL — ABNORMAL LOW (ref 0.350–4.500)

## 2021-10-04 LAB — GLUCOSE, CAPILLARY: Glucose-Capillary: 144 mg/dL — ABNORMAL HIGH (ref 70–99)

## 2021-10-04 LAB — HEMOGLOBIN A1C
Hgb A1c MFr Bld: 6.2 % — ABNORMAL HIGH (ref 4.8–5.6)
Mean Plasma Glucose: 131.24 mg/dL

## 2021-10-04 LAB — ETHANOL: Alcohol, Ethyl (B): <10 mg/dL (ref <10)

## 2021-10-04 LAB — CBG MONITORING, ED
Glucose-Capillary: 177 mg/dL — ABNORMAL HIGH (ref 70–99)
Glucose-Capillary: 172 mg/dL — ABNORMAL HIGH (ref 70–99)
Glucose-Capillary: 162 mg/dL — ABNORMAL HIGH (ref 70–99)

## 2021-10-04 LAB — T4, FREE: Free T4: 1.38 ng/dL — ABNORMAL HIGH (ref 0.61–1.12)

## 2021-10-04 LAB — BRAIN NATRIURETIC PEPTIDE: B Natriuretic Peptide: 7.9 pg/mL (ref 0.0–100.0)

## 2021-10-04 LAB — PROCALCITONIN: Procalcitonin: <0.1 ng/mL

## 2021-10-04 LAB — HIV ANTIBODY (ROUTINE TESTING W REFLEX): HIV Screen 4th Generation wRfx: NONREACTIVE

## 2021-10-04 LAB — LACTIC ACID, PLASMA
Lactic Acid, Venous: 2.3 mmol/L (ref 0.5–1.9)
Lactic Acid, Venous: 3.1 mmol/L (ref 0.5–1.9)

## 2021-10-04 MED ORDER — INSULIN ASPART 100 UNIT/ML IJ SOLN
0.0000 [IU] | Freq: Three times a day (TID) | INTRAMUSCULAR | Status: DC
  Administered 2021-10-04 (×2): 3 [IU] via SUBCUTANEOUS
  Filled 2021-10-04 (×2): qty 1

## 2021-10-04 MED ORDER — AMITRIPTYLINE HCL 25 MG PO TABS
100.0000 mg | ORAL_TABLET | Freq: Every day | ORAL | Status: DC
  Administered 2021-10-04 (×2): 100 mg via ORAL
  Filled 2021-10-04: qty 4
  Filled 2021-10-04: qty 2

## 2021-10-04 MED ORDER — IPRATROPIUM-ALBUTEROL 0.5-2.5 (3) MG/3ML IN SOLN
3.0000 mL | RESPIRATORY_TRACT | Status: DC
Start: 2021-10-04 — End: 2021-10-04
  Administered 2021-10-04 (×4): 3 mL via RESPIRATORY_TRACT
  Filled 2021-10-04 (×4): qty 3

## 2021-10-04 MED ORDER — ROPINIROLE HCL 1 MG PO TABS
0.5000 mg | ORAL_TABLET | Freq: Every morning | ORAL | Status: DC
  Administered 2021-10-04 – 2021-10-05 (×2): 0.5 mg via ORAL
  Filled 2021-10-04: qty 2
  Filled 2021-10-04: qty 1

## 2021-10-04 MED ORDER — SODIUM CHLORIDE 0.9 % IV SOLN: 2.0000 g | INTRAVENOUS | Status: DC

## 2021-10-04 MED ORDER — BENZONATATE 100 MG PO CAPS
200.0000 mg | ORAL_CAPSULE | Freq: Three times a day (TID) | ORAL | Status: DC
  Administered 2021-10-04 – 2021-10-05 (×4): 200 mg via ORAL
  Filled 2021-10-04 (×5): qty 2

## 2021-10-04 MED ORDER — DIPHENHYDRAMINE HCL 50 MG/ML IJ SOLN
12.5000 mg | Freq: Once | INTRAMUSCULAR | Status: AC
  Administered 2021-10-04: 12.5 mg via INTRAVENOUS
  Filled 2021-10-04: qty 1

## 2021-10-04 MED ORDER — SODIUM CHLORIDE 0.9 % IV SOLN
1.0000 g | Freq: Once | INTRAVENOUS | Status: AC
  Administered 2021-10-04: 1 g via INTRAVENOUS
  Filled 2021-10-04: qty 10

## 2021-10-04 MED ORDER — PREDNISONE 20 MG PO TABS
40.0000 mg | ORAL_TABLET | Freq: Every day | ORAL | Status: DC
  Administered 2021-10-05: 40 mg via ORAL
  Filled 2021-10-04: qty 2

## 2021-10-04 MED ORDER — BUTALBITAL-APAP-CAFFEINE 50-325-40 MG PO TABS
1.0000 | ORAL_TABLET | ORAL | Status: DC | PRN
  Administered 2021-10-04: 1 via ORAL
  Filled 2021-10-04: qty 1

## 2021-10-04 MED ORDER — HYDROCOD POLI-CHLORPHE POLI ER 10-8 MG/5ML PO SUER
5.0000 mL | Freq: Once | ORAL | Status: AC
  Administered 2021-10-04: 5 mL via ORAL
  Filled 2021-10-04: qty 5

## 2021-10-04 MED ORDER — PANTOPRAZOLE SODIUM 40 MG IV SOLR: 40.0000 mg | Freq: Two times a day (BID) | INTRAVENOUS | Status: DC

## 2021-10-04 MED ORDER — ROPINIROLE HCL 1 MG PO TABS
1.5000 mg | ORAL_TABLET | Freq: Every day | ORAL | Status: DC
  Administered 2021-10-04 (×2): 1.5 mg via ORAL
  Filled 2021-10-04 (×3): qty 2

## 2021-10-04 MED ORDER — HYDROCOD POLI-CHLORPHE POLI ER 10-8 MG/5ML PO SUER
5.0000 mL | Freq: Two times a day (BID) | ORAL | Status: DC | PRN
  Administered 2021-10-04 – 2021-10-05 (×2): 5 mL via ORAL
  Filled 2021-10-04 (×2): qty 5

## 2021-10-04 MED ORDER — MIRABEGRON ER 25 MG PO TB24: 25.0000 mg | ORAL_TABLET | Freq: Every day | ORAL | Status: DC

## 2021-10-04 MED ORDER — HEPARIN SODIUM (PORCINE) 5000 UNIT/ML IJ SOLN
5000.0000 [IU] | Freq: Two times a day (BID) | INTRAMUSCULAR | Status: DC
  Administered 2021-10-04 (×3): 5000 [IU] via SUBCUTANEOUS
  Filled 2021-10-04 (×3): qty 1

## 2021-10-04 MED ORDER — VENLAFAXINE HCL ER 75 MG PO CP24: 75.0000 mg | ORAL_CAPSULE | Freq: Every day | ORAL | Status: DC

## 2021-10-04 MED ORDER — THIAMINE HCL 100 MG/ML IJ SOLN: 100.0000 mg | Freq: Every day | INTRAMUSCULAR | Status: DC

## 2021-10-04 MED ORDER — LEVOTHYROXINE SODIUM 100 MCG PO TABS
150.0000 ug | ORAL_TABLET | Freq: Every day | ORAL | Status: DC
  Administered 2021-10-04 – 2021-10-05 (×2): 150 ug via ORAL
  Filled 2021-10-04: qty 1
  Filled 2021-10-04: qty 3

## 2021-10-04 MED ORDER — ARFORMOTEROL TARTRATE 15 MCG/2ML IN NEBU
15.0000 ug | INHALATION_SOLUTION | Freq: Two times a day (BID) | RESPIRATORY_TRACT | Status: DC
  Administered 2021-10-04 – 2021-10-05 (×2): 15 ug via RESPIRATORY_TRACT
  Filled 2021-10-04 (×4): qty 2

## 2021-10-04 MED ORDER — ROPINIROLE HCL 1 MG PO TABS: 1.0000 mg | ORAL_TABLET | ORAL | Status: DC

## 2021-10-04 MED ORDER — IOHEXOL 350 MG/ML SOLN
75.0000 mL | Freq: Once | INTRAVENOUS | Status: AC | PRN
Start: 2021-10-04 — End: 2021-10-04
  Administered 2021-10-04: 75 mL via INTRAVENOUS

## 2021-10-04 MED ORDER — HYDRALAZINE HCL 20 MG/ML IJ SOLN: 10.0000 mg | Freq: Four times a day (QID) | INTRAMUSCULAR | Status: DC | PRN

## 2021-10-04 MED ORDER — SODIUM CHLORIDE 0.9 % IV SOLN: 500.0000 mg | INTRAVENOUS | Status: DC

## 2021-10-04 MED ORDER — BUDESONIDE 0.25 MG/2ML IN SUSP
0.2500 mg | Freq: Two times a day (BID) | RESPIRATORY_TRACT | Status: DC
  Administered 2021-10-04 – 2021-10-05 (×3): 0.25 mg via RESPIRATORY_TRACT
  Filled 2021-10-04 (×3): qty 2

## 2021-10-04 MED ORDER — GABAPENTIN 400 MG PO CAPS
800.0000 mg | ORAL_CAPSULE | Freq: Four times a day (QID) | ORAL | Status: DC
  Administered 2021-10-04 – 2021-10-05 (×5): 800 mg via ORAL
  Filled 2021-10-04 (×6): qty 2

## 2021-10-04 MED ORDER — ACETAMINOPHEN 325 MG PO TABS
650.0000 mg | ORAL_TABLET | Freq: Four times a day (QID) | ORAL | Status: DC | PRN
  Administered 2021-10-04 (×2): 650 mg via ORAL
  Filled 2021-10-04 (×2): qty 2

## 2021-10-04 MED ORDER — METHYLPREDNISOLONE SODIUM SUCC 40 MG IJ SOLR
40.0000 mg | Freq: Two times a day (BID) | INTRAMUSCULAR | Status: AC
  Administered 2021-10-04 (×2): 40 mg via INTRAVENOUS
  Filled 2021-10-04 (×2): qty 1

## 2021-10-04 MED ORDER — FLUTICASONE FUROATE-VILANTEROL 200-25 MCG/ACT IN AEPB: 1.0000 | INHALATION_SPRAY | Freq: Every day | RESPIRATORY_TRACT | Status: DC

## 2021-10-04 MED ORDER — IPRATROPIUM-ALBUTEROL 0.5-2.5 (3) MG/3ML IN SOLN
3.0000 mL | Freq: Four times a day (QID) | RESPIRATORY_TRACT | Status: DC
  Administered 2021-10-05 (×2): 3 mL via RESPIRATORY_TRACT
  Filled 2021-10-04 (×3): qty 3

## 2021-10-04 MED ORDER — PANTOPRAZOLE SODIUM 40 MG PO TBEC
40.0000 mg | DELAYED_RELEASE_TABLET | Freq: Every day | ORAL | Status: DC
Start: 2021-10-04 — End: 2021-10-05
  Administered 2021-10-04 – 2021-10-05 (×2): 40 mg via ORAL
  Filled 2021-10-04 (×2): qty 1

## 2021-10-04 MED ORDER — METOCLOPRAMIDE HCL 5 MG/ML IJ SOLN
10.0000 mg | Freq: Once | INTRAMUSCULAR | Status: AC
  Administered 2021-10-04: 10 mg via INTRAVENOUS
  Filled 2021-10-04: qty 2

## 2021-10-04 MED ORDER — MELATONIN 5 MG PO TABS: 5.0000 mg | ORAL_TABLET | Freq: Once | ORAL | Status: DC

## 2021-10-04 NOTE — Assessment & Plan Note (Signed)
Continue patient on her levothyroxine at 150 mcg. We will check a free T4 and TSH to further adjust dosages.

## 2021-10-04 NOTE — Assessment & Plan Note (Signed)
Patient continued on her amitriptyline and venlafaxine.

## 2021-10-04 NOTE — Progress Notes (Signed)
Brief progress note.  This is a nonbillable note.  Please see same-day H&P from Dr. Posey Pronto for full billable details.  Briefly, this is a 56 year old female with history of COPD, hypertension, hypothyroidism who presents for shortness of breath and cough.  Sent by PCP for presumptive COPD exacerbation.  Clinical signs and symptoms during his hospital admission are consistent with above diagnosis.  Started on appropriate regimen of steroids and bronchodilators.  Symptoms improving over interval.  Patient's only complaint on my evaluation was headache which she attributes to cough.  Plan: Continue steroids Scheduled and as needed bronchodilators Supplemental oxygen as needed As needed headache treatment Possible discharge in 24 hours if symptoms continue to improve  Ralene Muskrat MD

## 2021-10-04 NOTE — Progress Notes (Signed)
Patient was tachycardic while in ED and remains tachycardic. Will continue to monitor and follow MEWS protocol for q2hr vital signs.   10/04/21 2021  Assess: MEWS Score  Temp 98.1 F (36.7 C)  BP 118/71  MAP (mmHg) 83  Pulse Rate (!) 111  Resp 18  SpO2 92 %  O2 Device Room Air  Assess: MEWS Score  MEWS Temp 0  MEWS Systolic 0  MEWS Pulse 2  MEWS RR 0  MEWS LOC 0  MEWS Score 2  MEWS Score Color Yellow  Assess: if the MEWS score is Yellow or Red  Were vital signs taken at a resting state? Yes  Focused Assessment No change from prior assessment  Does the patient meet 2 or more of the SIRS criteria? No  MEWS guidelines implemented *See Row Information* Yes  Treat  MEWS Interventions Other (Comment) (patient has been tachycardiac in ED, will continue to monitor)  Pain Scale 0-10  Pain Score 0  Take Vital Signs  Increase Vital Sign Frequency  Yellow: Q 2hr X 2 then Q 4hr X 2, if remains yellow, continue Q 4hrs  Escalate  MEWS: Escalate Yellow: discuss with charge nurse/RN and consider discussing with provider and RRT  Notify: Charge Nurse/RN  Name of Charge Nurse/RN Notified Orvil Feil, RN  Date Charge Nurse/RN Notified 10/04/21  Time Charge Nurse/RN Notified 2022  Document  Patient Outcome Other (Comment)  Progress note created (see row info) Yes  Assess: SIRS CRITERIA  SIRS Temperature  0  SIRS Pulse 1  SIRS Respirations  0  SIRS WBC 0  SIRS Score Sum  1

## 2021-10-04 NOTE — Assessment & Plan Note (Signed)
Blood pressure 128/89, pulse (!) 111, temperature 98.3 F (36.8 C), temperature source Oral, resp. rate 18, height '5\' 5"'$  (1.651 m), weight 83.5 kg, SpO2 99 %. Blood pressure is controlled. As needed hydralazine as needed.

## 2021-10-04 NOTE — Assessment & Plan Note (Signed)
Differentials include COPD exacerbation, pneumonia, right-sided heart failure. BNP is pending.  Procalcitonin is pending.  ABG is pending.  Dimer is negative. Continue with supplemental oxygen with goal O2 sat above 88 to 90%.

## 2021-10-04 NOTE — Assessment & Plan Note (Signed)
We will continue patient on treatment for community-acquired pneumonia along with COPD exacerbation. Patient will be continued on steroids and DuoNeb inhalers.

## 2021-10-04 NOTE — ED Notes (Signed)
Pt reports she has had no ETOH consumption in 5 years. MD Posey Pronto made aware.

## 2021-10-04 NOTE — H&P (Signed)
History and Physical    Patient: Jenna Tyler HAF:[id number] DOB: [birth year] DOA: 10/03/2021 DOS: the patient was seen and examined on 10/04/2021 PCP: [facility]  Patient coming from: Home  Chief Complaint:  Chief Complaint  Patient presents with   Cough   COPD   HPI: Jenna Tyler is a 56 y.o. female with medical history significant of COPD, hypertension, hypothyroidism, asthma, GBS history presenting with complaints of shortness of breath and cough.  Was sent here by primary care clinic for COPD exacerbation.  Symptoms of shortness of breath and cough started yesterday patient reports mild chest discomfort with coughing.  Patient also reports sweats no fever nausea vomiting abdominal pain headaches blurred vision speech or gait issues diarrhea or any dysuria. In the emergency room patient meets severe sepsis criteria with a suspected infection/underlying pneumonia and patient started on Regimen with Rocephin and azithromycin.  Patient also received DuoNeb Robitussin normal saline bolus x2.  Blood cultures obtained.  Review of Systems: As mentioned in the history of present illness. All other systems reviewed and are negative. 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 Her smoking use included cigarettes. She has never used smokeless tobacco. She reports that she does not currently use alcohol. She reports that she does not use drugs.  Allergies  Allergen Reactions   Nsaids Other (See Comments)    Internal bleeding  Intestinal bleeding   Tizanidine Other (See Comments)    Visual hallucinations   Adhesive [Tape] Rash   Aspirin Other (See Comments)    Cannot take due to ulcers.   Penicillins Rash    Has patient had a PCN reaction causing immediate rash, facial/tongue/throat swelling, SOB or lightheadedness with hypotension: Yes Has patient had a PCN reaction causing severe rash involving mucus membranes or skin necrosis: No Has patient had a PCN reaction that required hospitalization: No Has patient had a PCN reaction occurring within the last 10 years: Yes If all of the above answers are "NO", then may proceed with Cephalosporin use.  Patient tolerated ANCEF administration     Family History  Problem Relation Age of Onset   Breast cancer Sister 53   Breast cancer Maternal Aunt 21   Breast cancer Maternal Grandmother 60   Breast cancer Maternal Aunt 40   Breast cancer Cousin 40       maternal side    Prior to Admission medications   Medication Sig Start Date End Date Taking? Authorizing Provider  amitriptyline (ELAVIL) 10 MG tablet Take 40 mg by mouth at bedtime.  Patient not taking: No sig reported    [provider]  amitriptyline (ELAVIL) 75 MG tablet Take 75 mg by mouth at bedtime. 11/10/20   [provider]  baclofen (LIORESAL) 10 MG tablet Take 10 mg by mouth daily. 07/04/20   [provider]  benzonatate (TESSALON) 200 MG capsule Take 1 capsule (200 mg total) by mouth 3 (three) times daily. Patient not taking: No sig reported 07/19/20   Sidney Ace, MD   chlorpheniramine-HYDROcodone [facility] PENNKINETIC ER) 10-8 MG/5ML SUER Take 5 mLs by mouth 2 (two) times daily. Patient not taking: Reported on 01/17/2021 12/07/20   Nena Polio, MD  gabapentin (NEURONTIN) 300 MG capsule Take 900-1,200 mg by mouth See admin instructions. Take 3 capsules ('900mg'$ ) by mouth every morning, take 3 capsules ('900mg'$ ) by mouth daily at lunchtime, take 3 capsules ('900mg'$ ) by mouth every evening and take 4 capsules ('1200mg'$ ) by mouth every night at bedtime 07/06/19   [provider]  gabapentin (NEURONTIN) 800 MG tablet Take 800 mg by mouth 4 (four) times daily. 11/08/20   [provider]  levothyroxine (SYNTHROID) 150 MCG tablet Take 150 mcg by mouth daily before breakfast.    [provider]  lidocaine (XYLOCAINE) 2 % solution Use as directed 15 mLs in the mouth or throat as needed for mouth pain. 06/01/21   Blake Divine, MD  meclizine (ANTIVERT) 25 MG tablet Take 1 tablet (25 mg total) by mouth 3 (three) times daily as needed for dizziness. 08/30/21   Vladimir Crofts, MD  methocarbamol (ROBAXIN) 500 MG tablet Take 1.5 tablets by mouth every 8 (eight) hours as needed. 07/13/20   [provider]  mirabegron ER (MYRBETRIQ) 25 MG TB24 tablet Take 1 tablet (25 mg total) by mouth daily. 01/17/21   Vaillancourt, Aldona Bar, PA-C  naproxen (NAPROSYN) 500 MG tablet Take 1 tablet (500 mg total) by mouth 2 (two) times daily with a meal. Patient not taking: No sig reported 10/01/20   Carrie Mew, MD  omeprazole (PRILOSEC OTC) 20 MG tablet Take 1 tablet (20 mg total) by mouth daily. 06/01/21 06/01/22  Blake Divine, MD  ondansetron (ZOFRAN ODT) 4 MG disintegrating tablet Take 1 tablet (4 mg total) by mouth every 8 (eight) hours as needed for nausea or vomiting. 10/01/20   Carrie Mew, MD  polyethylene glycol powder (GLYCOLAX/MIRALAX) 17 GM/SCOOP powder 1 cap full in a full glass of water, two times a day for 3 days. Patient not taking: No sig reported  10/01/20   Carrie Mew, MD  rOPINIRole (REQUIP) 1 MG tablet Take 1-2 mg by mouth See admin instructions. Take 1 tablet ('1mg'$ ) by mouth every morning and take 2 tablets ('2mg'$ ) by mouth every night at bedtime    [provider]  SYMBICORT 160-4.5 MCG/ACT inhaler Inhale 2 puffs into the lungs 2 (two) times daily. 05/25/20   [provider]  venlafaxine XR (EFFEXOR-XR) 75 MG 24 hr capsule Take 75 mg by mouth daily.    [provider]    Physical Exam: Vitals:   10/03/21 2100 10/03/21 2130 10/03/21 2200 10/04/21 0100  BP: 124/82 135/79 131/77 128/89  Pulse: (!) 115 (!) 117 (!) 119 (!) 111  Resp: (!) '21 20 19 18  '$ Temp:    98.3 F (36.8 C)  TempSrc:    Oral  SpO2: 100% 99% 100% 99%  Weight:      Height:       Physical Exam Vitals and nursing note reviewed.  Constitutional:  General: She is not in acute distress.    Appearance: She is not ill-appearing, toxic-appearing or diaphoretic.  HENT:     Head: Normocephalic and atraumatic.     Right Ear: Hearing and external ear normal.     Left Ear: Hearing and external ear normal.     Nose: Nose normal. No nasal deformity.     Mouth/Throat:     Lips: Pink.     Mouth: Mucous membranes are moist.     Tongue: No lesions.     Pharynx: Oropharynx is clear.  Eyes:     Extraocular Movements: Extraocular movements intact.     Pupils: Pupils are equal, round, and reactive to light.  Cardiovascular:     Rate and Rhythm: Normal rate and regular rhythm.     Pulses: Normal pulses.     Heart sounds: Normal heart sounds.  Pulmonary:     Effort: Pulmonary effort is normal.     Breath sounds: Wheezing present.  Abdominal:     General: Bowel sounds are normal. There is no distension.     Palpations: Abdomen is soft. There is no mass.     Tenderness: There is no abdominal tenderness. There is no guarding.     Hernia: No hernia is present.  Musculoskeletal:     Right lower leg: No edema.     Left lower leg: No edema.   Skin:    General: Skin is warm.  Neurological:     General: No focal deficit present.     Mental Status: She is alert and oriented to person, place, and time.     Cranial Nerves: Cranial nerves 2-12 are intact.     Motor: Motor function is intact.  Psychiatric:        Attention and Perception: Attention normal.        Mood and Affect: Mood normal.        Speech: Speech normal.        Behavior: Behavior normal. Behavior is cooperative.        Cognition and Memory: Cognition normal.     Data Reviewed: Results for orders placed or performed during the hospital encounter of 10/03/21 (from the past 24 hour(s))  CBC with Differential     Status: Abnormal   Collection Time: 10/03/21  5:19 PM  Result Value Ref Range   WBC 14.4 (H) 4.0 - 10.5 K/uL   RBC 6.08 (H) 3.87 - 5.11 MIL/uL   Hemoglobin 16.3 (H) 12.0 - 15.0 g/dL   HCT 48.8 (H) 36.0 - 46.0 %   MCV 80.3 80.0 - 100.0 fL   MCH 26.8 26.0 - 34.0 pg   MCHC 33.4 30.0 - 36.0 g/dL   RDW 14.6 11.5 - 15.5 %   Platelets 309 150 - 400 K/uL   nRBC 0.0 0.0 - 0.2 %   Neutrophils Relative % 86 %   Neutro Abs 12.4 (H) 1.7 - 7.7 K/uL   Lymphocytes Relative 10 %   Lymphs Abs 1.4 0.7 - 4.0 K/uL   Monocytes Relative 3 %   Monocytes Absolute 0.4 0.1 - 1.0 K/uL   Eosinophils Relative 0 %   Eosinophils Absolute 0.0 0.0 - 0.5 K/uL   Basophils Relative 0 %   Basophils Absolute 0.0 0.0 - 0.1 K/uL   Immature Granulocytes 1 %   Abs Immature Granulocytes 0.12 (H) 0.00 - 0.07 K/uL  Comprehensive metabolic panel     Status: Abnormal   Collection Time: 10/03/21  5:19 PM  Result  Value Ref Range   Sodium 137 135 - 145 mmol/L   Potassium 4.0 3.5 - 5.1 mmol/L   Chloride 104 98 - 111 mmol/L   CO2 22 22 - 32 mmol/L   Glucose, Bld 184 (H) 70 - 99 mg/dL   BUN 22 (H) 6 - 20 mg/dL   Creatinine, Ser 0.90 0.44 - 1.00 mg/dL   Calcium 9.5 8.9 - 10.3 mg/dL   Total Protein 7.5 6.5 - 8.1 g/dL   Albumin 4.1 3.5 - 5.0 g/dL   AST 27 15 - 41 U/L   ALT 38 0 - 44 U/L    Alkaline Phosphatase 121 38 - 126 U/L   Total Bilirubin 0.5 0.3 - 1.2 mg/dL   GFR, Estimated >60 >60 mL/min   Anion gap 11 5 - 15  SARS Coronavirus 2 by RT PCR (hospital order, performed in [facility] hospital lab) *cepheid single result test* Anterior Nasal Swab     Status: None   Collection Time: 10/03/21  5:27 PM   Specimen: Anterior Nasal Swab  Result Value Ref Range   SARS Coronavirus 2 by RT PCR NEGATIVE NEGATIVE  D-dimer, quantitative     Status: None   Collection Time: 10/03/21  5:28 PM  Result Value Ref Range   D-Dimer, Quant <0.27 0.00 - 0.50 ug/mL-FEU  Troponin I (High Sensitivity)     Status: None   Collection Time: 10/03/21  5:28 PM  Result Value Ref Range   Troponin I (High Sensitivity) <2 <18 ng/L  Troponin I (High Sensitivity)     Status: None   Collection Time: 10/03/21  8:03 PM  Result Value Ref Range   Troponin I (High Sensitivity) 3 <18 ng/L  Lactic acid, plasma     Status: Abnormal   Collection Time: 10/04/21 12:00 AM  Result Value Ref Range   Lactic Acid, Venous 3.1 (HH) 0.5 - 1.9 mmol/L    Assessment and Plan: * SOB (shortness of breath) Differentials include COPD exacerbation, pneumonia, right-sided heart failure. BNP is pending.  Procalcitonin is pending.  ABG is pending.  Dimer is negative. Continue with supplemental oxygen with goal O2 sat above 88 to 90%.   CAP (community acquired pneumonia) We will continue patient on treatment for community-acquired pneumonia along with COPD exacerbation. Patient will be continued on steroids and DuoNeb inhalers.   Severe sepsis ([place]) We will follow blood cultures. We will follow procalcitonin levels. Antibiotic to be discontinued if prolactin is negative.  Acquired hypothyroidism Continue patient on her levothyroxine at 150 mcg. We will check a free T4 and TSH to further adjust dosages.   Alcohol use disorder, severe, dependence (HCC) Ethanol level pending, thiamine 100 mg IV x1 now, CIWA  precautions.   HTN (hypertension) Blood pressure 128/89, pulse (!) 111, temperature 98.3 F (36.8 C), temperature source Oral, resp. rate 18, height '5\' 5"'$  (1.651 m), weight 83.5 kg, SpO2 99 %. Blood pressure is controlled. As needed hydralazine as needed.    Multiple duodenal ulcers History of GI evaluation abdominal pain chronically and history of duodenal ulcers chart review shows patient is on naproxen discontinue any and all NSAIDs and start patient on IV PPI therapy.  Hemoglobin is stable, type and cross.  Depression with anxiety Patient continued on her amitriptyline and venlafaxine.     Advance Care Planning:    Code Status: Full Code   Consults:  None   Family Communication:  LASHAN, MACIAS (Daughter)  [phone] (Mobile)  Severity of Illness: The appropriate patient status for this  patient is OBSERVATION. Observation status is judged to be reasonable and necessary in order to provide the required intensity of service to ensure the patient's safety. The patient's presenting symptoms, physical exam findings, and initial radiographic and laboratory data in the context of their medical condition is felt to place them at decreased risk for further clinical deterioration. Furthermore, it is anticipated that the patient will be medically stable for discharge from the hospital within 2 midnights of admission.   Author: Para Skeans, MD 10/04/2021 1:23 AM  For on call review [email or web address].

## 2021-10-04 NOTE — Assessment & Plan Note (Signed)
History of GI evaluation abdominal pain chronically and history of duodenal ulcers chart review shows patient is on naproxen discontinue any and all NSAIDs and start patient on IV PPI therapy.  Hemoglobin is stable, type and cross.

## 2021-10-04 NOTE — Assessment & Plan Note (Signed)
Ethanol level pending, thiamine 100 mg IV x1 now, CIWA precautions.

## 2021-10-04 NOTE — Assessment & Plan Note (Signed)
We will follow blood cultures. We will follow procalcitonin levels. Antibiotic to be discontinued if prolactin is negative.

## 2021-10-05 DIAGNOSIS — R0602 Shortness of breath: Secondary | ICD-10-CM | POA: Diagnosis not present

## 2021-10-05 LAB — GLUCOSE, CAPILLARY: Glucose-Capillary: 111 mg/dL — ABNORMAL HIGH (ref 70–99)

## 2021-10-05 MED ORDER — BENZONATATE 100 MG PO CAPS
200.0000 mg | ORAL_CAPSULE | Freq: Three times a day (TID) | ORAL | Status: DC | PRN
  Administered 2021-10-05: 200 mg via ORAL

## 2021-10-05 MED ORDER — HYDROCOD POLI-CHLORPHE POLI ER 10-8 MG/5ML PO SUER
5.0000 mL | Freq: Two times a day (BID) | ORAL | 0 refills | Status: AC | PRN
Start: 2021-10-05 — End: 2021-10-15

## 2021-10-05 MED ORDER — BENZONATATE 200 MG PO CAPS: 200.0000 mg | ORAL_CAPSULE | Freq: Three times a day (TID) | ORAL | 0 refills | Status: DC

## 2021-10-05 MED ORDER — PREDNISONE 20 MG PO TABS: 40.0000 mg | ORAL_TABLET | Freq: Every day | ORAL | 0 refills | Status: AC

## 2021-10-05 MED ORDER — BUTALBITAL-APAP-CAFFEINE 50-325-40 MG PO TABS: 1.0000 | ORAL_TABLET | ORAL | 0 refills | Status: DC | PRN

## 2021-10-05 NOTE — TOC CM/SW Note (Signed)
Patient has orders to discharge home today. Chart reviewed. PCP is [facility]. On room air. No wounds. No TOC needs identified. CSW signing off.  [place] Scrape, [place]

## 2021-10-05 NOTE — Progress Notes (Signed)
Pt d/c to home via family. IV removed intact. VSS. Education completed. All belongings sent with pt.

## 2021-10-05 NOTE — Discharge Summary (Signed)
Physician Discharge Summary  Jenna Tyler ZDG:[id number] DOB: 1965-09-19 DOA: 10/03/2021  PCP: [facility] date: 10/03/2021 Discharge date: 10/05/2021  Admitted From: Home Disposition:  Home  Recommendations for Outpatient Follow-up:  Follow up with PCP in 1-2 weeks Follow up with pulmonary as directed  Home Health: No Equipment/Devices: None  Discharge Condition: Stable CODE STATUS: Full Diet recommendation: Regular  Brief/Interim Summary: 56 year old female with history of COPD, hypertension, hypothyroidism who presents for shortness of breath and cough.  Sent by PCP for presumptive COPD exacerbation.  Clinical signs and symptoms during his hospital admission are consistent with above diagnosis.  Started on appropriate regimen of steroids and bronchodilators.  Symptoms improving over interval.  Patient's only complaint on my evaluation was headache which she attributes to cough.  Respiratory status improved.  Patient on room air without any evidence of wheeze or shortness of breath at time of discharge.  Will discharge home.  Complete 5 days additionally of p.o. prednisone.  Can resume home bronchodilator regimen.  Follow-up outpatient PCP and pulmonology.    Discharge Diagnoses:  Principal Problem:   SOB (shortness of breath) Active Problems:   CAP (community acquired pneumonia)   Severe sepsis (HCC)   Acquired hypothyroidism   Alcohol use disorder, severe, dependence ([place])   HTN (hypertension)   Multiple duodenal ulcers   COPD exacerbation (HCC)   Depression with anxiety  Acute decompensated COPD Initially there was concern for community-acquired pneumonia however this is felt unlikely.  No evidence of pneumonia on chest x-ray, negative procalcitonin.  No antibiotics indicated.  Patient was treated with combination therapy of steroids, bronchodilators.  Did not require supplemental oxygen.  Respiratory status returned to baseline at  time of discharge.  Patient discharged home in stable condition.  Discharge Instructions  Discharge Instructions     Diet - low sodium heart healthy   Complete by: As directed    Increase activity slowly   Complete by: As directed       Allergies as of 10/05/2021       Reactions   Nsaids Other (See Comments)   Internal bleeding  Intestinal bleeding   Tizanidine Other (See Comments)   Visual hallucinations   Adhesive [tape] Rash   Aspirin Other (See Comments)   Cannot take due to ulcers.   Penicillins Rash   Has patient had a PCN reaction causing immediate rash, facial/tongue/throat swelling, SOB or lightheadedness with hypotension: Yes Has patient had a PCN reaction causing severe rash involving mucus membranes or skin necrosis: No Has patient had a PCN reaction that required hospitalization: No Has patient had a PCN reaction occurring within the last 10 years: Yes If all of the above answers are "NO", then may proceed with Cephalosporin use. Patient tolerated ANCEF administration         Medication List     TAKE these medications    amitriptyline 75 MG tablet Commonly known as: ELAVIL Take 75 mg by mouth at bedtime.   benzonatate 200 MG capsule Commonly known as: TESSALON Take 1 capsule (200 mg total) by mouth 3 (three) times daily.   butalbital-acetaminophen-caffeine 50-325-40 MG tablet Commonly known as: FIORICET Take 1 tablet by mouth every 4 (four) hours as needed for headache.   chlorpheniramine-HYDROcodone 10-8 MG/5ML Commonly known as: Tussionex Pennkinetic ER Take 5 mLs by mouth every 12 (twelve) hours as needed for up to 10 days for cough.   ezetimibe 10 MG tablet Commonly known as: ZETIA Take 10 mg  by mouth daily.   gabapentin 800 MG tablet Commonly known as: NEURONTIN Take 800 mg by mouth 4 (four) times daily.   hydrOXYzine 50 MG capsule Commonly known as: VISTARIL Take 50 mg by mouth 2 (two) times daily.   levothyroxine 200 MCG  tablet Commonly known as: SYNTHROID Take 200 mcg by mouth daily before breakfast.   lidocaine 2 % solution Commonly known as: XYLOCAINE Use as directed 15 mLs in the mouth or throat as needed for mouth pain.   meclizine 25 MG tablet Commonly known as: ANTIVERT Take 1 tablet (25 mg total) by mouth 3 (three) times daily as needed for dizziness.   meloxicam 15 MG tablet Commonly known as: MOBIC Take 15 mg by mouth daily.   methocarbamol 750 MG tablet Commonly known as: ROBAXIN Take 750 mg by mouth 2 (two) times daily.   omeprazole 20 MG tablet Commonly known as: PriLOSEC OTC Take 1 tablet (20 mg total) by mouth daily.   predniSONE 20 MG tablet Commonly known as: DELTASONE Take 2 tablets (40 mg total) by mouth daily with breakfast for 5 days.   rOPINIRole 1 MG tablet Commonly known as: REQUIP Take 1-3 mg by mouth See admin instructions. Take 1 tablet ('1mg'$ ) by mouth every morning and take 3 tablets ('3mg'$ ) by mouth every night at bedtime   Symbicort 160-4.5 MCG/ACT inhaler Generic drug: budesonide-formoterol Inhale 2 puffs into the lungs 2 (two) times daily.        Allergies  Allergen Reactions   Nsaids Other (See Comments)    Internal bleeding  Intestinal bleeding   Tizanidine Other (See Comments)    Visual hallucinations   Adhesive [Tape] Rash   Aspirin Other (See Comments)    Cannot take due to ulcers.   Penicillins Rash    Has patient had a PCN reaction causing immediate rash, facial/tongue/throat swelling, SOB or lightheadedness with hypotension: Yes Has patient had a PCN reaction causing severe rash involving mucus membranes or skin necrosis: No Has patient had a PCN reaction that required hospitalization: No Has patient had a PCN reaction occurring within the last 10 years: Yes If all of the above answers are "NO", then may proceed with Cephalosporin use.  Patient tolerated ANCEF administration     Consultations: None   Procedures/Studies: CT Angio  Chest Pulmonary Embolism (PE) W or WO Contrast  Result Date: 10/04/2021 CLINICAL DATA:  Cough, shortness of breath EXAM: CT ANGIOGRAPHY CHEST WITH CONTRAST TECHNIQUE: Multidetector CT imaging of the chest was performed using the standard protocol during bolus administration of intravenous contrast. Multiplanar CT image reconstructions and MIPs were obtained to evaluate the vascular anatomy. RADIATION DOSE REDUCTION: This exam was performed according to the departmental dose-optimization program which includes automated exposure control, adjustment of the mA and/or kV according to patient size and/or use of iterative reconstruction technique. CONTRAST:  45m OMNIPAQUE IOHEXOL 350 MG/ML SOLN COMPARISON:  07/18/2020 FINDINGS: Cardiovascular: No filling defects in the pulmonary arteries to suggest pulmonary emboli. Heart is normal size. Aorta is normal caliber. Mediastinum/Nodes: No mediastinal, hilar, or axillary adenopathy. Trachea and esophagus are unremarkable. Thyroid unremarkable. Lungs/Pleura: No confluent opacities or effusions. Upper Abdomen: Imaging into the upper abdomen demonstrates no acute findings. Musculoskeletal: Chest wall soft tissues are unremarkable. No acute bony abnormality. Review of the MIP images confirms the above findings. IMPRESSION: No evidence of pulmonary embolus. No acute cardiopulmonary disease. Electronically Signed   By: KRolm BaptiseM.D.   On: 10/04/2021 02:28   DG Chest Portable 1 View  Result Date: 10/03/2021 CLINICAL DATA:  Shortness of breath, cough EXAM: PORTABLE CHEST 1 VIEW COMPARISON:  07/17/2020 FINDINGS: Heart is normal size. Lingular scarring. No confluent airspace opacities or effusions. No acute bony abnormality. IMPRESSION: No active disease. Electronically Signed   By: Rolm Baptise M.D.   On: 10/03/2021 20:36      Subjective: Seen and examined on day of discharge.  Stable no distress.  Mild persistent cough otherwise no acute issues requiring continued  hospitalization.  Stable for discharge home  Discharge Exam: Vitals:   10/05/21 0507 10/05/21 0818  BP: 131/68 123/69  Pulse: (!) 103 (!) 104  Resp: 18 16  Temp: (!) 97.5 F (36.4 C) 98 F (36.7 C)  SpO2: 99% 99%   Vitals:   10/05/21 0016 10/05/21 0444 10/05/21 0507 10/05/21 0818  BP: 111/66  131/68 123/69  Pulse: (!) 111  (!) 103 (!) 104  Resp: '18  18 16  '$ Temp: 97.9 F (36.6 C)  (!) 97.5 F (36.4 C) 98 F (36.7 C)  TempSrc: Oral  Oral   SpO2: 95% 97% 99% 99%  Weight:      Height:        General: Pt is alert, awake, not in acute distress Cardiovascular: RRR, S1/S2 +, no rubs, no gallops Respiratory: CTA bilaterally, no wheezing, no rhonchi Abdominal: Soft, NT, ND, bowel sounds + Extremities: no edema, no cyanosis    The results of significant diagnostics from this hospitalization (including imaging, microbiology, ancillary and laboratory) are listed below for reference.     Microbiology: Recent Results (from the past 240 hour(s))  SARS Coronavirus 2 by RT PCR (hospital order, performed in [facility] hospital lab) *cepheid single result test* Anterior Nasal Swab     Status: None   Collection Time: 10/03/21  5:27 PM   Specimen: Anterior Nasal Swab  Result Value Ref Range Status   SARS Coronavirus 2 by RT PCR NEGATIVE NEGATIVE Final    Comment: (NOTE) SARS-CoV-2 target nucleic acids are NOT DETECTED.  The SARS-CoV-2 RNA is generally detectable in upper and lower respiratory specimens during the acute phase of infection. The lowest concentration of SARS-CoV-2 viral copies this assay can detect is 250 copies / mL. A negative result does not preclude SARS-CoV-2 infection and should not be used as the sole basis for treatment or other patient management decisions.  A negative result may occur with improper specimen collection / handling, submission of specimen other than nasopharyngeal swab, presence of viral mutation(s) within the areas targeted by this assay, and  inadequate number of viral copies (<250 copies / mL). A negative result must be combined with clinical observations, patient history, and epidemiological information.  Fact Sheet for Patients:   https://www.patel.info/  Fact Sheet for Healthcare Providers: https://hall.com/  This test is not yet approved or  cleared by the [place] FDA and has been authorized for detection and/or diagnosis of SARS-CoV-2 by FDA under an Emergency Use Authorization (EUA).  This EUA will remain in effect (meaning this test can be used) for the duration of the COVID-19 declaration under Section 564(b)(1) of the Act, 21 U.S.C. section 360bbb-3(b)(1), unless the authorization is terminated or revoked sooner.  Performed at [facility], [address] [place], [place] [address]   Blood culture (routine x 2)     Status: None (Preliminary result)   Collection Time: 10/04/21 12:00 AM   Specimen: BLOOD  Result Value Ref Range Status   Specimen Description BLOOD RIGHT ASSIST CONTROL  Final  Special Requests   Final    BOTTLES DRAWN AEROBIC AND ANAEROBIC Blood Culture adequate volume   Culture   Final    NO GROWTH 1 DAY Performed at [facility], [address] [place], [place] [address]    Report Status PENDING  Incomplete  Blood culture (routine x 2)     Status: None (Preliminary result)   Collection Time: 10/04/21 12:00 AM   Specimen: BLOOD  Result Value Ref Range Status   Specimen Description BLOOD RIGHT HAND  Final   Special Requests   Final    BOTTLES DRAWN AEROBIC AND ANAEROBIC Blood Culture results may not be optimal due to an inadequate volume of blood received in culture bottles   Culture   Final    NO GROWTH 1 DAY Performed at [facility], [address] [place], [place] [address]    Report Status PENDING  Incomplete     Labs: BNP (last 3 results) Recent Labs    10/04/21 0151  BNP 7.9   Basic  Metabolic Panel: Recent Labs  Lab 10/03/21 1719 10/04/21 0549  NA 137 136  K 4.0 3.9  CL 104 108  CO2 22 19*  GLUCOSE 184* 191*  BUN 22* 21*  CREATININE 0.90 0.71  CALCIUM 9.5 8.8*   Liver Function Tests: Recent Labs  Lab 10/03/21 1719  AST 27  ALT 38  ALKPHOS 121  BILITOT 0.5  PROT 7.5  ALBUMIN 4.1   No results for input(s): "LIPASE", "AMYLASE" in the last 168 hours. No results for input(s): "AMMONIA" in the last 168 hours. CBC: Recent Labs  Lab 10/03/21 1719 10/04/21 0549  WBC 14.4* 10.6*  NEUTROABS 12.4*  --   HGB 16.3* 14.2  HCT 48.8* 42.6  MCV 80.3 79.6*  PLT 309 283   Cardiac Enzymes: No results for input(s): "CKTOTAL", "CKMB", "CKMBINDEX", "TROPONINI" in the last 168 hours. BNP: Invalid input(s): "POCBNP" CBG: Recent Labs  Lab 10/04/21 0547 10/04/21 1106 10/04/21 1632 10/04/21 2026 10/05/21 0743  GLUCAP 177* 172* 162* 144* 111*   D-Dimer Recent Labs    10/03/21 1728  DDIMER <0.27   Hgb A1c Recent Labs    10/04/21 0151  HGBA1C 6.2*   Lipid Profile No results for input(s): "CHOL", "HDL", "LDLCALC", "TRIG", "CHOLHDL", "LDLDIRECT" in the last 72 hours. Thyroid function studies Recent Labs    10/04/21 0151  TSH 0.061*   Anemia work up No results for input(s): "VITAMINB12", "FOLATE", "FERRITIN", "TIBC", "IRON", "RETICCTPCT" in the last 72 hours. Urinalysis    Component Value Date/Time   COLORURINE STRAW (A) 08/29/2021 2158   APPEARANCEUR CLEAR (A) 08/29/2021 2158   APPEARANCEUR Cloudy (A) 09/27/2020 1438   LABSPEC 1.015 08/29/2021 2158   LABSPEC 1.021 03/28/2014 1540   PHURINE 5.0 08/29/2021 2158   GLUCOSEU NEGATIVE 08/29/2021 2158   GLUCOSEU Negative 03/28/2014 1540   HGBUR NEGATIVE 08/29/2021 2158   BILIRUBINUR NEGATIVE 08/29/2021 2158   BILIRUBINUR Negative 09/27/2020 1438   BILIRUBINUR Negative 03/28/2014 [place] 08/29/2021 2158   PROTEINUR NEGATIVE 08/29/2021 2158   NITRITE NEGATIVE 08/29/2021 2158    LEUKOCYTESUR NEGATIVE 08/29/2021 2158   LEUKOCYTESUR 1+ 03/28/2014 1540   Sepsis Labs Recent Labs  Lab 10/03/21 1719 10/04/21 0549  WBC 14.4* 10.6*   Microbiology Recent Results (from the past 240 hour(s))  SARS Coronavirus 2 by RT PCR (hospital order, performed in [facility] hospital lab) *cepheid single result test* Anterior Nasal Swab     Status: None   Collection  Time: 10/03/21  5:27 PM   Specimen: Anterior Nasal Swab  Result Value Ref Range Status   SARS Coronavirus 2 by RT PCR NEGATIVE NEGATIVE Final    Comment: (NOTE) SARS-CoV-2 target nucleic acids are NOT DETECTED.  The SARS-CoV-2 RNA is generally detectable in upper and lower respiratory specimens during the acute phase of infection. The lowest concentration of SARS-CoV-2 viral copies this assay can detect is 250 copies / mL. A negative result does not preclude SARS-CoV-2 infection and should not be used as the sole basis for treatment or other patient management decisions.  A negative result may occur with improper specimen collection / handling, submission of specimen other than nasopharyngeal swab, presence of viral mutation(s) within the areas targeted by this assay, and inadequate number of viral copies (<250 copies / mL). A negative result must be combined with clinical observations, patient history, and epidemiological information.  Fact Sheet for Patients:   https://www.patel.info/  Fact Sheet for Healthcare Providers: https://hall.com/  This test is not yet approved or  cleared by the [place] FDA and has been authorized for detection and/or diagnosis of SARS-CoV-2 by FDA under an Emergency Use Authorization (EUA).  This EUA will remain in effect (meaning this test can be used) for the duration of the COVID-19 declaration under Section 564(b)(1) of the Act, 21 U.S.C. section 360bbb-3(b)(1), unless the authorization is terminated or revoked  sooner.  Performed at [facility], [address] [place], [place] [address]   Blood culture (routine x 2)     Status: None (Preliminary result)   Collection Time: 10/04/21 12:00 AM   Specimen: BLOOD  Result Value Ref Range Status   Specimen Description BLOOD RIGHT ASSIST CONTROL  Final   Special Requests   Final    BOTTLES DRAWN AEROBIC AND ANAEROBIC Blood Culture adequate volume   Culture   Final    NO GROWTH 1 DAY Performed at [facility], [address] [place], [place] [address]    Report Status PENDING  Incomplete  Blood culture (routine x 2)     Status: None (Preliminary result)   Collection Time: 10/04/21 12:00 AM   Specimen: BLOOD  Result Value Ref Range Status   Specimen Description BLOOD RIGHT HAND  Final   Special Requests   Final    BOTTLES DRAWN AEROBIC AND ANAEROBIC Blood Culture results may not be optimal due to an inadequate volume of blood received in culture bottles   Culture   Final    NO GROWTH 1 DAY Performed at [facility], [address], [place], [place] [address]    Report Status PENDING  Incomplete     Time coordinating discharge: Over 30 minutes  SIGNED:   Sidney Ace, MD  [facility] 10/05/2021, 1:04 PM Pager   If 7PM-7AM, please contact night-coverage

## 2021-10-06 ENCOUNTER — Telehealth: Payer: Self-pay

## 2021-10-06 NOTE — Telephone Encounter (Signed)
Transition Care Management Unsuccessful Follow-up Telephone Call  Date of discharge and from where:  10/05/2021-[facility]  Attempts:  1st Attempt  Reason for unsuccessful TCM follow-up call:  Left voice message

## 2021-10-10 LAB — CULTURE, BLOOD (ROUTINE X 2)
Culture: NO GROWTH
Culture: NO GROWTH
Special Requests: ADEQUATE

## 2021-10-10 NOTE — Telephone Encounter (Signed)
Patient is not [facility] PCP.

## 2022-03-26 ENCOUNTER — Encounter: Payer: Self-pay | Admitting: Emergency Medicine

## 2022-03-26 ENCOUNTER — Other Ambulatory Visit: Payer: Self-pay

## 2022-03-26 ENCOUNTER — Emergency Department
Admission: EM | Admit: 2022-03-26 | Discharge: 2022-03-26 | Disposition: A | Discharge status: Home / Self Care | Payer: Medicaid Other | Attending: Emergency Medicine | Admitting: Emergency Medicine

## 2022-03-26 ENCOUNTER — Emergency Department: Payer: Medicaid Other

## 2022-03-26 DIAGNOSIS — J45909 Unspecified asthma, uncomplicated: Secondary | ICD-10-CM | POA: Diagnosis not present

## 2022-03-26 DIAGNOSIS — I10 Essential (primary) hypertension: Secondary | ICD-10-CM | POA: Insufficient documentation

## 2022-03-26 DIAGNOSIS — J449 Chronic obstructive pulmonary disease, unspecified: Secondary | ICD-10-CM | POA: Insufficient documentation

## 2022-03-26 DIAGNOSIS — J069 Acute upper respiratory infection, unspecified: Secondary | ICD-10-CM | POA: Diagnosis not present

## 2022-03-26 DIAGNOSIS — U071 COVID-19: Secondary | ICD-10-CM | POA: Insufficient documentation

## 2022-03-26 DIAGNOSIS — E039 Hypothyroidism, unspecified: Secondary | ICD-10-CM | POA: Insufficient documentation

## 2022-03-26 DIAGNOSIS — Z8509 Personal history of malignant neoplasm of other digestive organs: Secondary | ICD-10-CM | POA: Insufficient documentation

## 2022-03-26 DIAGNOSIS — R059 Cough, unspecified: Secondary | ICD-10-CM | POA: Diagnosis present

## 2022-03-26 LAB — RESP PANEL BY RT-PCR (FLU A&B, COVID) ARPGX2
Influenza A by PCR: NEGATIVE
Influenza B by PCR: NEGATIVE
SARS Coronavirus 2 by RT PCR: POSITIVE — AB

## 2022-03-26 MED ORDER — DOXYCYCLINE MONOHYDRATE 100 MG PO TABS: 100.0000 mg | ORAL_TABLET | Freq: Two times a day (BID) | ORAL | 0 refills | Status: AC

## 2022-03-26 MED ORDER — AMOXICILLIN-POT CLAVULANATE 875-125 MG PO TABS: 1.0000 | ORAL_TABLET | Freq: Two times a day (BID) | ORAL | 0 refills | Status: AC

## 2022-03-26 MED ORDER — HYDROCOD POLI-CHLORPHE POLI ER 10-8 MG/5ML PO SUER: 5.0000 mL | Freq: Two times a day (BID) | ORAL | 0 refills | Status: AC

## 2022-03-26 MED ORDER — BUTALBITAL-APAP-CAFFEINE 50-325-40 MG PO TABS: 1.0000 | ORAL_TABLET | ORAL | 0 refills | Status: DC | PRN

## 2022-03-26 MED ORDER — AMOXICILLIN-POT CLAVULANATE 875-125 MG PO TABS
1.0000 | ORAL_TABLET | Freq: Once | ORAL | Status: AC
  Administered 2022-03-26: 1 via ORAL
  Filled 2022-03-26: qty 1

## 2022-03-26 MED ORDER — DOXYCYCLINE HYCLATE 100 MG PO TABS
100.0000 mg | ORAL_TABLET | Freq: Once | ORAL | Status: AC
  Administered 2022-03-26: 100 mg via ORAL
  Filled 2022-03-26: qty 1

## 2022-03-26 MED ORDER — PREDNISONE 50 MG PO TABS: ORAL_TABLET | ORAL | 0 refills | Status: DC

## 2022-03-26 MED ORDER — HYDROCOD POLI-CHLORPHE POLI ER 10-8 MG/5ML PO SUER
5.0000 mL | Freq: Once | ORAL | Status: AC
  Administered 2022-03-26: 5 mL via ORAL
  Filled 2022-03-26: qty 5

## 2022-03-26 MED ORDER — PREDNISONE 20 MG PO TABS
60.0000 mg | ORAL_TABLET | Freq: Once | ORAL | Status: AC
  Administered 2022-03-26: 60 mg via ORAL
  Filled 2022-03-26: qty 3

## 2022-03-26 MED ORDER — ACETAMINOPHEN 500 MG PO TABS
1000.0000 mg | ORAL_TABLET | Freq: Once | ORAL | Status: AC
  Administered 2022-03-26: 1000 mg via ORAL
  Filled 2022-03-26: qty 2

## 2022-03-26 NOTE — ED Provider Notes (Signed)
[facility] Provider Note    Event Date/Time   First MD Initiated Contact with Patient 03/26/22 1036     (approximate)   History   Cough   HPI  Monet North is a 56 y.o. female past medical history of COPD hypertension hyperlipidemia and NF1 who presents with cough.  Patient was exposed to [place] last Friday.  On Saturday started to feel sick with coughing body aches.  Cough is dry.  She also feels short of breath.  Last night home pulse ox was in the high 80s.  Patient denies nausea vomiting diarrhea.  Denies lower extremity swelling or pain.  Patient denies chest pain.     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   Pancreatitis     Patient Active Problem List   Diagnosis Date Noted   Acute respiratory failure with hypoxia ([place]) 07/17/2020   Chest wall pain 07/17/2020   Abdominal pain 04/07/2020   Adaptation reaction 07/14/2019   Allergic rhinitis, seasonal 07/14/2019   Breast lump 07/14/2019   Cephalalgia 07/14/2019   Chronic pain associated with significant psychosocial dysfunction 07/14/2019   Encounter for screening for lipoid disorders 07/14/2019   Feeling bilious 07/14/2019   Feeling stressed out 07/14/2019   Immunization, tetanus toxoid 07/14/2019   Infection of the upper respiratory tract 07/14/2019   Urethra, 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   Uterine spasm 07/14/2019   Breath shortness 07/14/2019   Breathlessness on exertion 07/14/2019   SOB (shortness of breath)    Severe sepsis (HCC) 07/10/2019   Easy bruising 07/02/2019   Polycythemia 07/02/2019   Tremor 05/29/2019   Cognitive decline 09/29/2018   CAP (community acquired pneumonia) 08/28/2018   Seizures ([place]) 01/15/2018   Leg pain 10/18/2017   Lower extremity weakness  10/18/2017   Weakness 09/16/2017   Lower extremity pain, bilateral 09/10/2017   Other symptoms and signs involving the musculoskeletal system 07/26/2017   AKI (acute kidney injury) ([place]) 07/12/2017   HTN (hypertension) 12/29/2016   COPD exacerbation ([place]) 12/29/2016   Substance induced mood disorder ([place]) 12/29/2016   Alcohol use disorder, severe, dependence ([place]) 12/27/2016   Major depressive disorder, 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   Vomiting of fecal matter 08/15/2016   Globus sensation 08/15/2016   Multiple somatic complaints 08/15/2016   Multiple duodenal ulcers 01/24/2016   Prediabetes 07/07/2015   Neurofibromatosis, 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   Acquired hypothyroidism 04/04/2015   Pre-diabetes 04/04/2015   Vitamin D deficiency 04/04/2015   Borderline diabetes mellitus 04/04/2015   Anxiety 10/12/2014   Anxiety, generalized 10/12/2014   H/O: hypothyroidism 10/12/2014   Insomnia, persistent 10/12/2014   Cannot sleep 10/12/2014   Depression, major, recurrent, moderate ([place]) 10/12/2014   Drug abuse, opioid type ([place]) 10/12/2014   Nondependent opioid abuse in remission ([place]) 10/12/2014   Generalized anxiety disorder 10/12/2014   Moderate episode of recurrent major depressive disorder ([place]) 10/12/2014   Opioid abuse ([place]) 10/12/2014   History of hypothyroidism 10/12/2014   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   Severe protein-calorie malnutrition ([place]) 07/30/2014   Nausea & vomiting 07/07/2014   Hypothyroidism, unspecified 07/07/2014   Depression with anxiety 07/07/2014   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/2013   Lipoma of abdominal wall 09/15/2013   Pleurisy 08/17/2010  Vomiting and diarrhea 01/23/2010   Cough 11/24/2009   Postmenopausal atrophic vaginitis 11/21/2009   Lumbar back sprain 10/10/2009   Clinical depression 07/12/2009   Arthralgia of multiple joints 06/30/2009   Benign neoplasm of thyroid gland 05/27/2009   Fast heart  beat 05/10/2009   Persistent insomnia 02/07/2009   Mixed disorder as reaction to stress 10/09/2008   Boil of eyelid 08/01/2007   Chest pain on breathing 06/03/2007   LBP (low back pain) 03/17/2007   Epigastric pain 11/27/2006   Hypoglycemia 11/06/2005   Addiction, opium ([place]) 04/23/2002   Headache, migraine 04/23/1998   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 --      Excl. in [place]? --     Most recent vital signs: Vitals:   03/26/22 0920 03/26/22 1232  BP: (!) 107/94   Pulse:  95  Resp:    Temp:    SpO2:       General: Awake, no distress.  CV:  Good peripheral perfusion.  No peripheral edema Resp:  Normal effort.  Patient has frequent dry cough, lungs are clear no wheezing Abd:  No distention.  Neuro:             Awake, Alert, Oriented x 3  Other:     ED Results / Procedures / Treatments  Labs (all labs ordered are listed, but only abnormal results are displayed) Labs Reviewed  RESP PANEL BY RT-PCR (FLU A&B, COVID) ARPGX2 - Abnormal; Notable for the following components:      Result Value   SARS Coronavirus 2 by RT PCR POSITIVE (*)    All other components within normal limits     EKG     RADIOLOGY Chest x-ray reviewed interpreted myself shows right middle lobe opacity   PROCEDURES:  Critical Care performed: No  Procedures    MEDICATIONS ORDERED IN ED: Medications  predniSONE (DELTASONE) tablet 60 mg (60 mg Oral Given 03/26/22 1115)  amoxicillin-clavulanate (AUGMENTIN)  875-125 MG per tablet 1 tablet (1 tablet Oral Given 03/26/22 1115)  doxycycline (VIBRA-TABS) tablet 100 mg (100 mg Oral Given 03/26/22 1116)  chlorpheniramine-HYDROcodone (TUSSIONEX) 10-8 MG/5ML suspension 5 mL (5 mLs Oral Given 03/26/22 1116)  acetaminophen (TYLENOL) tablet 1,000 mg (1,000 mg Oral Given 03/26/22 1153)     IMPRESSION / MDM / ASSESSMENT AND PLAN / ED COURSE  I reviewed the triage vital signs and the nursing notes.                              Patient's presentation is most consistent with acute complicated illness / injury requiring diagnostic workup.  Differential diagnosis includes, but is not limited to, viral illness including COVID-19,  influenza, CAP, COPD exacerbation  Patient is a 56 year old female with history of COPD not on home oxygen who presents with cough shortness of breath body aches.  Symptoms started 3 days ago.  Arrival she is tachycardic sats are okay.  She has a frequent cough but is not in respiratory distress her lungs are clear no peripheral edema.  Chest x-ray obtained shows a right middle lobe infiltrate.  COVID test is notably positive.  Suspect COVID-pneumonia but difficult to rule out concomitant bacterial superinfection and given lobar process on chest x-ray we will treat as bacterial pneumonia.  Will give prednisone which may help with cough given her COPD.  Will also give a dose of Tussionex.    Patient was observed in the ED.  Continue to have dry cough however she did not drop her sats was satting 97 to 99% on room air for me.  Heart rate did improve.  Patient preferred to be admitted because she is worried about her symptoms progressing however at this time because of no hypoxia and hospital crowding I think that she requires admission at this time.  We discussed return precautions.  Will prescribe 5 days of prednisone and antibiotics as well as       FINAL CLINICAL IMPRESSION(S) / ED DIAGNOSES   Final diagnoses:  COVID-19  Viral URI with  cough     Rx / DC Orders   ED Discharge Orders          Ordered    doxycycline (ADOXA) 100 MG tablet  2 times daily        03/26/22 1233    amoxicillin-clavulanate (AUGMENTIN) 875-125 MG tablet  2 times daily        03/26/22 1233    predniSONE (DELTASONE) 50 MG tablet        03/26/22 1233    chlorpheniramine-HYDROcodone (TUSSIONEX) 10-8 MG/5ML  2 times daily        03/26/22 1233             Note:  This document was prepared using Dragon voice recognition software and may include unintentional dictation errors.   Rada Hay, MD 03/26/22 1235

## 2022-03-26 NOTE — ED Notes (Signed)
See triage note  States she was exposed to [place] last week   States she tested negative on Friday  but developed cough and started feeling bad on sat  Afebrile on arrival

## 2022-03-26 NOTE — ED Provider Triage Note (Signed)
Emergency Medicine Provider Triage Evaluation Note  Jenna Tyler , a 56 y.o. female  was evaluated in triage.  Pt complains of cough. Took covid test at home over weekend which was negative, but + exposure to covid.    Review of Systems  Positive: Hx of COPD and respiratory failure Negative: No fever, chills, nausea or vomiting. No CP.   Physical Exam  There were no vitals taken for this visit. Gen:   Awake, no distress  frequently coughing Resp:  Normal effort  faint wheeze noted after coughing MSK:   Moves extremities without difficulty  Other:    Medical Decision Making  Medically screening exam initiated at 9:15 AM.  Appropriate orders placed.  Jenna Tyler was informed that the remainder of the evaluation will be completed by another provider, this initial triage assessment does not replace that evaluation, and the importance of remaining in the ED until their evaluation is complete.     Johnn Hai, PA-C 03/26/22 [phone]

## 2022-03-26 NOTE — ED Triage Notes (Signed)
Pt in with co cough that worsened last night, pt with forceful cough noted in triage. NO fever, states non producitve.

## 2022-03-26 NOTE — Discharge Instructions (Addendum)
You have COVID-19.  It also looks like you are developing pneumonia.  Please take the prednisone once daily for the next 5 days.  You can take the Tussionex for cough.  Please also take the 2 antibiotics twice a day for the next 5 days.

## 2022-04-09 ENCOUNTER — Inpatient Hospital Stay
Admission: EM | Admit: 2022-04-09 | Discharge: 2022-04-11 | DRG: 193 | Disposition: A | Discharge status: Home / Self Care | Payer: Medicaid Other | Attending: Internal Medicine | Admitting: Internal Medicine

## 2022-04-09 ENCOUNTER — Other Ambulatory Visit: Payer: Self-pay

## 2022-04-09 ENCOUNTER — Encounter: Payer: Self-pay | Admitting: Emergency Medicine

## 2022-04-09 ENCOUNTER — Observation Stay: Payer: Medicaid Other

## 2022-04-09 ENCOUNTER — Emergency Department: Payer: Medicaid Other

## 2022-04-09 DIAGNOSIS — J129 Viral pneumonia, unspecified: Principal | ICD-10-CM

## 2022-04-09 DIAGNOSIS — J441 Chronic obstructive pulmonary disease with (acute) exacerbation: Secondary | ICD-10-CM | POA: Diagnosis present

## 2022-04-09 DIAGNOSIS — I10 Essential (primary) hypertension: Secondary | ICD-10-CM | POA: Diagnosis present

## 2022-04-09 DIAGNOSIS — Z880 Allergy status to penicillin: Secondary | ICD-10-CM

## 2022-04-09 DIAGNOSIS — Z6832 Body mass index (BMI) 32.0-32.9, adult: Secondary | ICD-10-CM

## 2022-04-09 DIAGNOSIS — E669 Obesity, unspecified: Secondary | ICD-10-CM

## 2022-04-09 DIAGNOSIS — Z87891 Personal history of nicotine dependence: Secondary | ICD-10-CM

## 2022-04-09 DIAGNOSIS — Z886 Allergy status to analgesic agent status: Secondary | ICD-10-CM

## 2022-04-09 DIAGNOSIS — E890 Postprocedural hypothyroidism: Secondary | ICD-10-CM | POA: Diagnosis present

## 2022-04-09 DIAGNOSIS — Z90710 Acquired absence of both cervix and uterus: Secondary | ICD-10-CM

## 2022-04-09 DIAGNOSIS — E039 Hypothyroidism, unspecified: Secondary | ICD-10-CM | POA: Diagnosis not present

## 2022-04-09 DIAGNOSIS — F1091 Alcohol use, unspecified, in remission: Secondary | ICD-10-CM | POA: Diagnosis present

## 2022-04-09 DIAGNOSIS — F1020 Alcohol dependence, uncomplicated: Secondary | ICD-10-CM | POA: Diagnosis not present

## 2022-04-09 DIAGNOSIS — Z8616 Personal history of COVID-19: Secondary | ICD-10-CM

## 2022-04-09 DIAGNOSIS — J440 Chronic obstructive pulmonary disease with acute lower respiratory infection: Secondary | ICD-10-CM | POA: Diagnosis present

## 2022-04-09 DIAGNOSIS — Z803 Family history of malignant neoplasm of breast: Secondary | ICD-10-CM

## 2022-04-09 DIAGNOSIS — J189 Pneumonia, unspecified organism: Principal | ICD-10-CM | POA: Diagnosis present

## 2022-04-09 DIAGNOSIS — J9601 Acute respiratory failure with hypoxia: Secondary | ICD-10-CM | POA: Diagnosis present

## 2022-04-09 DIAGNOSIS — E785 Hyperlipidemia, unspecified: Secondary | ICD-10-CM | POA: Diagnosis present

## 2022-04-09 DIAGNOSIS — Z7951 Long term (current) use of inhaled steroids: Secondary | ICD-10-CM

## 2022-04-09 DIAGNOSIS — Z888 Allergy status to other drugs, medicaments and biological substances status: Secondary | ICD-10-CM

## 2022-04-09 DIAGNOSIS — Z79890 Hormone replacement therapy (postmenopausal): Secondary | ICD-10-CM

## 2022-04-09 DIAGNOSIS — Z79899 Other long term (current) drug therapy: Secondary | ICD-10-CM

## 2022-04-09 LAB — BASIC METABOLIC PANEL
Anion gap: 9 (ref 5–15)
BUN: 18 mg/dL (ref 6–20)
CO2: 21 mmol/L — ABNORMAL LOW (ref 22–32)
Calcium: 8.7 mg/dL — ABNORMAL LOW (ref 8.9–10.3)
Chloride: 105 mmol/L (ref 98–111)
Creatinine, Ser: 1.11 mg/dL — ABNORMAL HIGH (ref 0.44–1.00)
GFR, Estimated: 58 mL/min — ABNORMAL LOW (ref >60)
Glucose, Bld: 132 mg/dL — ABNORMAL HIGH (ref 70–99)
Potassium: 3.6 mmol/L (ref 3.5–5.1)
Sodium: 135 mmol/L (ref 135–145)

## 2022-04-09 LAB — CBC WITH DIFFERENTIAL/PLATELET
Abs Immature Granulocytes: 0.22 10*3/uL — ABNORMAL HIGH (ref 0.00–0.07)
Basophils Absolute: 0.1 10*3/uL (ref 0.0–0.1)
Basophils Relative: 0 %
Eosinophils Absolute: 0.2 10*3/uL (ref 0.0–0.5)
Eosinophils Relative: 1 %
HCT: 43.5 % (ref 36.0–46.0)
Hemoglobin: 14.7 g/dL (ref 12.0–15.0)
Immature Granulocytes: 2 %
Lymphocytes Relative: 8 %
Lymphs Abs: 1.1 10*3/uL (ref 0.7–4.0)
MCH: 28.7 pg (ref 26.0–34.0)
MCHC: 33.8 g/dL (ref 30.0–36.0)
MCV: 84.8 fL (ref 80.0–100.0)
Monocytes Absolute: 0.7 10*3/uL (ref 0.1–1.0)
Monocytes Relative: 5 %
Neutro Abs: 11.5 10*3/uL — ABNORMAL HIGH (ref 1.7–7.7)
Neutrophils Relative %: 84 %
Platelets: 254 10*3/uL (ref 150–400)
RBC: 5.13 MIL/uL — ABNORMAL HIGH (ref 3.87–5.11)
RDW: 14.6 % (ref 11.5–15.5)
WBC: 13.7 10*3/uL — ABNORMAL HIGH (ref 4.0–10.5)
nRBC: 0 % (ref 0.0–0.2)

## 2022-04-09 LAB — RESP PANEL BY RT-PCR (RSV, FLU A&B, COVID)  RVPGX2
Influenza A by PCR: NEGATIVE
Influenza B by PCR: NEGATIVE
Resp Syncytial Virus by PCR: NEGATIVE
SARS Coronavirus 2 by RT PCR: NEGATIVE

## 2022-04-09 LAB — BRAIN NATRIURETIC PEPTIDE: B Natriuretic Peptide: 8.9 pg/mL (ref 0.0–100.0)

## 2022-04-09 LAB — HEPATIC FUNCTION PANEL
ALT: 24 U/L (ref 0–44)
AST: 29 U/L (ref 15–41)
Albumin: 3.7 g/dL (ref 3.5–5.0)
Alkaline Phosphatase: 91 U/L (ref 38–126)
Bilirubin, Direct: <0.1 mg/dL (ref 0.0–0.2)
Total Bilirubin: 0.6 mg/dL (ref 0.3–1.2)
Total Protein: 6.6 g/dL (ref 6.5–8.1)

## 2022-04-09 LAB — TROPONIN I (HIGH SENSITIVITY): Troponin I (High Sensitivity): 3 ng/L (ref <18)

## 2022-04-09 LAB — LACTIC ACID, PLASMA
Lactic Acid, Venous: 2.8 mmol/L (ref 0.5–1.9)
Lactic Acid, Venous: 1.3 mmol/L (ref 0.5–1.9)

## 2022-04-09 LAB — PROCALCITONIN: Procalcitonin: <0.1 ng/mL

## 2022-04-09 LAB — D-DIMER, QUANTITATIVE: D-Dimer, Quant: <0.27 ug/mL-FEU (ref 0.00–0.50)

## 2022-04-09 MED ORDER — GABAPENTIN 400 MG PO CAPS
800.0000 mg | ORAL_CAPSULE | Freq: Two times a day (BID) | ORAL | Status: DC
  Administered 2022-04-10 – 2022-04-11 (×3): 800 mg via ORAL
  Filled 2022-04-09 (×4): qty 2

## 2022-04-09 MED ORDER — HYDROCOD POLI-CHLORPHE POLI ER 10-8 MG/5ML PO SUER
5.0000 mL | Freq: Once | ORAL | Status: AC
  Administered 2022-04-09: 5 mL via ORAL
  Filled 2022-04-09: qty 5

## 2022-04-09 MED ORDER — LACTATED RINGERS IV BOLUS: 1000.0000 mL | Freq: Once | INTRAVENOUS | Status: DC

## 2022-04-09 MED ORDER — ACETAMINOPHEN 500 MG PO TABS
1000.0000 mg | ORAL_TABLET | Freq: Once | ORAL | Status: AC
  Administered 2022-04-09: 1000 mg via ORAL
  Filled 2022-04-09: qty 2

## 2022-04-09 MED ORDER — MOMETASONE FURO-FORMOTEROL FUM 200-5 MCG/ACT IN AERO
2.0000 | INHALATION_SPRAY | Freq: Two times a day (BID) | RESPIRATORY_TRACT | Status: DC
  Administered 2022-04-11: 2 via RESPIRATORY_TRACT

## 2022-04-09 MED ORDER — SODIUM CHLORIDE 0.9 % IV SOLN
2.0000 g | INTRAVENOUS | Status: DC
  Administered 2022-04-10: 2 g via INTRAVENOUS
  Filled 2022-04-09: qty 2

## 2022-04-09 MED ORDER — SODIUM CHLORIDE 0.9 % IV SOLN
500.0000 mg | INTRAVENOUS | Status: DC
  Filled 2022-04-09: qty 5

## 2022-04-09 MED ORDER — BUTALBITAL-APAP-CAFFEINE 50-325-40 MG PO TABS
1.0000 | ORAL_TABLET | Freq: Four times a day (QID) | ORAL | Status: DC | PRN
  Administered 2022-04-10 (×2): 1 via ORAL
  Filled 2022-04-09 (×3): qty 1

## 2022-04-09 MED ORDER — PREDNISONE 20 MG PO TABS
60.0000 mg | ORAL_TABLET | Freq: Every day | ORAL | Status: DC
  Administered 2022-04-10 – 2022-04-11 (×2): 60 mg via ORAL
  Filled 2022-04-09 (×2): qty 3

## 2022-04-09 MED ORDER — SODIUM CHLORIDE 0.9 % IV BOLUS
1000.0000 mL | Freq: Once | INTRAVENOUS | Status: AC
  Administered 2022-04-09: 1000 mL via INTRAVENOUS

## 2022-04-09 MED ORDER — ADULT MULTIVITAMIN W/MINERALS CH
1.0000 | ORAL_TABLET | Freq: Every day | ORAL | Status: DC
  Administered 2022-04-09 – 2022-04-11 (×3): 1 via ORAL
  Filled 2022-04-09 (×3): qty 1

## 2022-04-09 MED ORDER — SODIUM CHLORIDE 0.9 % IV SOLN
2.0000 g | Freq: Once | INTRAVENOUS | Status: AC
  Administered 2022-04-09: 2 g via INTRAVENOUS
  Filled 2022-04-09: qty 20

## 2022-04-09 MED ORDER — METHYLPREDNISOLONE SODIUM SUCC 125 MG IJ SOLR
125.0000 mg | Freq: Once | INTRAMUSCULAR | Status: AC
  Administered 2022-04-09: 125 mg via INTRAVENOUS
  Filled 2022-04-09: qty 2

## 2022-04-09 MED ORDER — FOLIC ACID 1 MG PO TABS
1.0000 mg | ORAL_TABLET | Freq: Every day | ORAL | Status: DC
  Administered 2022-04-09 – 2022-04-11 (×3): 1 mg via ORAL
  Filled 2022-04-09 (×3): qty 1

## 2022-04-09 MED ORDER — LACTATED RINGERS IV SOLN: INTRAVENOUS | Status: DC

## 2022-04-09 MED ORDER — HYDROCOD POLI-CHLORPHE POLI ER 10-8 MG/5ML PO SUER
5.0000 mL | Freq: Two times a day (BID) | ORAL | Status: DC
  Administered 2022-04-09 – 2022-04-11 (×4): 5 mL via ORAL
  Filled 2022-04-09 (×4): qty 5

## 2022-04-09 MED ORDER — METOPROLOL TARTRATE 25 MG PO TABS
25.0000 mg | ORAL_TABLET | Freq: Two times a day (BID) | ORAL | Status: DC
  Administered 2022-04-10 – 2022-04-11 (×4): 25 mg via ORAL
  Filled 2022-04-09 (×4): qty 1

## 2022-04-09 MED ORDER — ROPINIROLE HCL 1 MG PO TABS
1.0000 mg | ORAL_TABLET | Freq: Two times a day (BID) | ORAL | Status: DC
  Administered 2022-04-10 – 2022-04-11 (×4): 1 mg via ORAL
  Filled 2022-04-09 (×5): qty 1

## 2022-04-09 MED ORDER — LEVOTHYROXINE SODIUM 50 MCG PO TABS
50.0000 ug | ORAL_TABLET | Freq: Every day | ORAL | Status: DC
  Administered 2022-04-10 – 2022-04-11 (×2): 50 ug via ORAL
  Filled 2022-04-09 (×2): qty 1

## 2022-04-09 MED ORDER — IPRATROPIUM-ALBUTEROL 0.5-2.5 (3) MG/3ML IN SOLN
3.0000 mL | Freq: Four times a day (QID) | RESPIRATORY_TRACT | Status: DC
  Administered 2022-04-09 – 2022-04-11 (×7): 3 mL via RESPIRATORY_TRACT
  Filled 2022-04-09 (×7): qty 3

## 2022-04-09 MED ORDER — AMITRIPTYLINE HCL 100 MG PO TABS
100.0000 mg | ORAL_TABLET | Freq: Every day | ORAL | Status: DC
  Administered 2022-04-10 (×2): 100 mg via ORAL
  Filled 2022-04-09: qty 1
  Filled 2022-04-09: qty 2

## 2022-04-09 MED ORDER — TRAMADOL HCL 50 MG PO TABS
50.0000 mg | ORAL_TABLET | Freq: Two times a day (BID) | ORAL | Status: DC | PRN
  Administered 2022-04-10: 50 mg via ORAL
  Filled 2022-04-09: qty 1

## 2022-04-09 MED ORDER — METHOCARBAMOL 500 MG PO TABS
750.0000 mg | ORAL_TABLET | Freq: Two times a day (BID) | ORAL | Status: DC
  Administered 2022-04-10 – 2022-04-11 (×3): 750 mg via ORAL
  Filled 2022-04-09 (×2): qty 2
  Filled 2022-04-09: qty 1
  Filled 2022-04-09: qty 2

## 2022-04-09 MED ORDER — SODIUM CHLORIDE 0.9 % IV SOLN
500.0000 mg | Freq: Once | INTRAVENOUS | Status: AC
  Administered 2022-04-09: 500 mg via INTRAVENOUS
  Filled 2022-04-09: qty 5

## 2022-04-09 MED ORDER — ENOXAPARIN SODIUM 40 MG/0.4ML IJ SOSY
40.0000 mg | PREFILLED_SYRINGE | INTRAMUSCULAR | Status: DC
  Administered 2022-04-09 – 2022-04-10 (×2): 40 mg via SUBCUTANEOUS
  Filled 2022-04-09 (×2): qty 0.4

## 2022-04-09 MED ORDER — THIAMINE HCL 100 MG/ML IJ SOLN
100.0000 mg | Freq: Every day | INTRAMUSCULAR | Status: DC
  Administered 2022-04-09: 100 mg via INTRAVENOUS
  Filled 2022-04-09: qty 2

## 2022-04-09 MED ORDER — THIAMINE MONONITRATE 100 MG PO TABS
100.0000 mg | ORAL_TABLET | Freq: Every day | ORAL | Status: DC
  Administered 2022-04-10 – 2022-04-11 (×2): 100 mg via ORAL
  Filled 2022-04-09 (×2): qty 1

## 2022-04-09 MED ORDER — IPRATROPIUM-ALBUTEROL 0.5-2.5 (3) MG/3ML IN SOLN
3.0000 mL | Freq: Once | RESPIRATORY_TRACT | Status: AC
  Administered 2022-04-09: 3 mL via RESPIRATORY_TRACT
  Filled 2022-04-09: qty 3

## 2022-04-09 NOTE — Consult Note (Signed)
CODE SEPSIS - PHARMACY COMMUNICATION  **Broad Spectrum Antibiotics should be administered within 1 hour of Sepsis diagnosis**  Time Code Sepsis Called/Page Received: 1632  Antibiotics Ordered: ceftriaxone, azithromycin  Time of 1st antibiotic administration: [place] ,PharmD, BCPS Clinical Pharmacist  04/09/2022  4:38 PM

## 2022-04-09 NOTE — ED Notes (Signed)
Patient called out needing to use the restroom. Patient was assisted to the restroom and assisted to change into a gown upon return to the room.

## 2022-04-09 NOTE — Progress Notes (Signed)
[place] to EMT that patient needs repeat lactic acid drawn now

## 2022-04-09 NOTE — Assessment & Plan Note (Addendum)
Pulse ox at home 88%.  Patient was on oxygen during the hospital stay.  On the day of discharge pulse ox remained above 90% with ambulation.  This problem has resolved.  Will complete a steroid taper.

## 2022-04-09 NOTE — ED Provider Notes (Signed)
[facility] Provider Note    Event Date/Time   First MD Initiated Contact with Patient 04/09/22 1506     (approximate)   History   Cough and Shortness of Breath   HPI  Jenna Tyler is a 56 y.o. female here with cough and shortness of breath.  The patient has an extensive history of COPD with multiple previous hospitalizations for the same.  She states that over the last several days she has had worsening shortness of breath.  She was actually diagnosed with COVID-19 about 2 and half weeks ago and was placed on steroids and antibiotics.  She took these with some mild improvement, then has since had recurrence of symptoms over the last 2 to 3 days.  She has had shortness of breath.  She had wheezing.  Denies any abdominal pain, nausea, or vomiting.  No other complaints.  No chest pain or shortness of breath.     Physical Exam   Triage Vital Signs: ED Triage Vitals  Enc Vitals Group     BP 04/09/22 1256 123/80     Pulse Rate 04/09/22 1256 (!) 140     Resp 04/09/22 1256 (!) 22     Temp 04/09/22 1256 99.6 F (37.6 C)     Temp Source 04/09/22 1256 Oral     SpO2 04/09/22 1256 93 %     Weight 04/09/22 1258 198 lb (89.8 kg)     Height 04/09/22 1416 '5\' 5"'$  (1.651 m)     Head Circumference --      Peak Flow --      Pain Score 04/09/22 1257 6     Pain Loc --      Pain Edu? --      Excl. in [place]? --     Most recent vital signs: Vitals:   04/09/22 1256 04/09/22 1432  BP: 123/80 (!) 140/103  Pulse: (!) 140 (!) 124  Resp: (!) 22 (!) 22  Temp: 99.6 F (37.6 C)   SpO2: 93% 91%     General: Awake, no distress.  CV:  Good peripheral perfusion.  Tachycardic. Resp:  Increased work of breathing with bilateral wheezes and diminished aeration.  No rhonchi. Abd:  No distention.  No tenderness. Other:  No lower extremity edema or asymmetry.   ED Results / Procedures / Treatments   Labs (all labs ordered are listed, but only abnormal results are  displayed) Labs Reviewed  BASIC METABOLIC PANEL - Abnormal; Notable for the following components:      Result Value   CO2 21 (*)    Glucose, Bld 132 (*)    Creatinine, Ser 1.11 (*)    Calcium 8.7 (*)    GFR, Estimated 58 (*)    All other components within normal limits  CBC WITH DIFFERENTIAL/PLATELET - Abnormal; Notable for the following components:   WBC 13.7 (*)    RBC 5.13 (*)    Neutro Abs 11.5 (*)    Abs Immature Granulocytes 0.22 (*)    All other components within normal limits  LACTIC ACID, PLASMA - Abnormal; Notable for the following components:   Lactic Acid, Venous 2.8 (*)    All other components within normal limits  RESP PANEL BY RT-PCR (RSV, FLU A&B, COVID)  RVPGX2  CULTURE, BLOOD (ROUTINE X 2)  CULTURE, BLOOD (ROUTINE X 2)  EXPECTORATED SPUTUM ASSESSMENT W GRAM STAIN, RFLX TO RESP C  RESPIRATORY PANEL BY PCR  PROCALCITONIN  D-DIMER, QUANTITATIVE  HEPATIC FUNCTION PANEL  BRAIN NATRIURETIC PEPTIDE  LACTIC ACID, PLASMA  HIV ANTIBODY (ROUTINE TESTING W REFLEX)  CBC WITH DIFFERENTIAL/PLATELET  BASIC METABOLIC PANEL  TROPONIN I (HIGH SENSITIVITY)     EKG    RADIOLOGY Chest x-ray: Bibasilar opacities   I also independently reviewed and agree with radiologist interpretations.   PROCEDURES:  Critical Care performed: Yes, see critical care procedure note(s)  .Critical Care  Performed by: Duffy Bruce, MD Authorized by: Duffy Bruce, MD   Critical care provider statement:    Critical care time (minutes):  30   Critical care time was exclusive of:  Separately billable procedures and treating other patients   Critical care was necessary to treat or prevent imminent or life-threatening deterioration of the following conditions:  Cardiac failure, circulatory failure and respiratory failure   Critical care was time spent personally by me on the following activities:  Development of treatment plan with patient or surrogate, discussions with consultants,  evaluation of patient's response to treatment, examination of patient, ordering and review of laboratory studies, ordering and review of radiographic studies, ordering and performing treatments and interventions, pulse oximetry, re-evaluation of patient's condition and review of old charts   I assumed direction of critical care for this patient from another provider in my specialty: no     Care discussed with: admitting provider       [place] ED: Medications  enoxaparin (LOVENOX) injection 40 mg (has no administration in time range)  ipratropium-albuterol (DUONEB) 0.5-2.5 (3) MG/3ML nebulizer solution 3 mL (has no administration in time range)  chlorpheniramine-HYDROcodone (TUSSIONEX) 10-8 MG/5ML suspension 5 mL (has no administration in time range)  predniSONE (DELTASONE) tablet 60 mg (has no administration in time range)  azithromycin (ZITHROMAX) 500 mg in sodium chloride 0.9 % 250 mL IVPB (has no administration in time range)  cefTRIAXone (ROCEPHIN) 2 g in sodium chloride 0.9 % 100 mL IVPB (has no administration in time range)  thiamine (VITAMIN B1) tablet 100 mg (has no administration in time range)    Or  thiamine (VITAMIN B1) injection 100 mg (has no administration in time range)  folic acid (FOLVITE) tablet 1 mg (has no administration in time range)  multivitamin with minerals tablet 1 tablet (has no administration in time range)  ipratropium-albuterol (DUONEB) 0.5-2.5 (3) MG/3ML nebulizer solution 3 mL (3 mLs Nebulization Given 04/09/22 1301)  chlorpheniramine-HYDROcodone (TUSSIONEX) 10-8 MG/5ML suspension 5 mL (5 mLs Oral Given 04/09/22 1430)  methylPREDNISolone sodium succinate (SOLU-MEDROL) 125 mg/2 mL injection 125 mg (125 mg Intravenous Given 04/09/22 1556)  cefTRIAXone (ROCEPHIN) 2 g in sodium chloride 0.9 % 100 mL IVPB (0 g Intravenous Stopped 04/09/22 1629)  azithromycin (ZITHROMAX) 500 mg in sodium chloride 0.9 % 250 mL IVPB (0 mg Intravenous Stopped 04/09/22  1658)  sodium chloride 0.9 % bolus 1,000 mL (0 mLs Intravenous Stopped 04/09/22 1829)  sodium chloride 0.9 % bolus 1,000 mL (0 mLs Intravenous Stopped 04/09/22 1829)  acetaminophen (TYLENOL) tablet 1,000 mg (1,000 mg Oral Given 04/09/22 1700)     IMPRESSION / MDM / [place] / ED COURSE  I reviewed the triage vital signs and the nursing notes.                              Differential diagnosis includes, but is not limited to, CAP, COPD exacerbation, PE, COVID-19, CHF, ACS, anemia.  Patient's presentation is most consistent with acute presentation with potential threat to life or bodily function.  The patient is on the cardiac monitor to evaluate for evidence of arrhythmia and/or significant heart rate changes.  56 yo F with PMHxasthma, COPD, HTN, HLD, here with generalized weakness, cough, SOB. CXR shows bibasilar PNA. She is hypoxic requiring 2L [place] with increased WOB though speaking in full sentences. IV steroids, IV ABX and duonebs x 3 given. Procal negative. D-Dimer negative reassuring with recent COVID-19. Trop neg, EKG nonischemic. Initial LA 2.8 - IVF given. Mild leukocytosis given hence ABX. Will admit to medicine for further management.     FINAL CLINICAL IMPRESSION(S) / ED DIAGNOSES   Final diagnoses:  Community acquired pneumonia, unspecified laterality  Acute respiratory failure with hypoxemia ([place])  COPD exacerbation ([place])     Rx / DC Orders   ED Discharge Orders     None        Note:  This document was prepared using Dragon voice recognition software and may include unintentional dictation errors.   Duffy Bruce, MD 04/09/22 2113

## 2022-04-09 NOTE — Assessment & Plan Note (Signed)
No signs of withdrawal.  Prescribe thiamine and folic acid upon disposition.

## 2022-04-09 NOTE — H&P (Signed)
History and Physical    Patient: Jenna Tyler BMW:[id number] DOB: 19-Oct-1965 DOA: 04/09/2022 DOS: the patient was seen and examined on 04/09/2022 PCP: [facility]  Patient coming from: Home  Chief Complaint:  Chief Complaint  Patient presents with   Cough   Shortness of Breath   HPI: Jenna Tyler is a 56 y.o. female with medical history significant of COPD-asthma overlap, hypertension, hypothyroidism, polysubstance abuse (alcohol, opioids), who presents to the ED with complaints of cough and shortness of breath.  [name] states That on 12/04, she was seen in the ED after experiencing a couple days of cough, shortness of breath and body aches.  At the time, she tested positive for COVID-19.  She was discharged home on doxycycline, Augmentin, prednisone and Tussionex.  Paxlovid not ordered due to prior adverse reactions.  Since then, her symptoms have gradually improved until yesterday.  Yesterday she noticed rapidly progressive worsening of shortness of breath with dyspnea on exertion and a nonproductive cough.  She states she was unable to walk short distances due to significance of dyspnea.  She endorses chills but denies any fever, nausea, vomiting, diarrhea, abdominal pain.  ED course: On arrival to the ED, patient was normotensive at 123/88 with pulse of 140.  She was saturating at 93% but subsequently desaturated to 91% was placed on 2 L nasal cannula. Initial workup remarkable for WBC of 13.7, troponin of 3, D-dimer less than 0.27, and BNP of 8.9.  Chest x-ray with low volumes, however possible developing opacities in the bibasilar regions.  Due to concern for CAP, patient was started on ceftriaxone and azithromycin.  TRH contacted for admission.  Review of Systems: As mentioned in the history of present illness. All other systems reviewed and are negative.  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 Her smoking use included cigarettes. She has never used smokeless tobacco. She reports that she does not currently use alcohol. She reports that she does not use drugs.  Allergies  Allergen Reactions   Nsaids Other (See Comments)    Internal bleeding  Intestinal bleeding   Tizanidine Other (See Comments)    Visual hallucinations   Adhesive [Tape] Rash   Aspirin Other (See Comments)    Cannot take due to ulcers.   Penicillins Rash    Has patient had a PCN reaction causing immediate rash, facial/tongue/throat swelling, SOB or lightheadedness with hypotension: Yes Has patient had a PCN reaction causing severe rash involving mucus membranes or skin necrosis: No Has patient had a PCN reaction that  required hospitalization: No Has patient had a PCN reaction occurring within the last 10 years: Yes If all of the above answers are "NO", then may proceed with Cephalosporin use.  Patient tolerated ANCEF administration     Family History  Problem Relation Age of Onset   Breast cancer Sister 73   Breast cancer Maternal Aunt 78   Breast cancer Maternal Grandmother 60   Breast cancer Maternal Aunt 40   Breast cancer Cousin  40       maternal side    Prior to Admission medications   Medication Sig Start Date End Date Taking? Authorizing Provider  amitriptyline (ELAVIL) 75 MG tablet Take 75 mg by mouth at bedtime. 11/10/20   [provider]  benzonatate (TESSALON) 200 MG capsule Take 1 capsule (200 mg total) by mouth 3 (three) times daily. 10/05/21   Sidney Ace, MD  butalbital-acetaminophen-caffeine (FIORICET) [phone] MG tablet Take 1 tablet by mouth every 4 (four) hours as needed for headache. 03/26/22   Rada Hay, MD  ezetimibe (ZETIA) 10 MG tablet Take 10 mg by mouth daily. 06/08/21   [provider]  gabapentin (NEURONTIN) 800 MG tablet Take 800 mg by mouth 4 (four) times daily. 11/08/20   [provider]  hydrOXYzine (VISTARIL) 50 MG capsule Take 50 mg by mouth 2 (two) times daily. 09/21/21   [provider]  levothyroxine (SYNTHROID) 200 MCG tablet Take 200 mcg by mouth daily before breakfast.    [provider]  lidocaine (XYLOCAINE) 2 % solution Use as directed 15 mLs in the mouth or throat as needed for mouth pain. 06/01/21   Blake Divine, MD  meclizine (ANTIVERT) 25 MG tablet Take 1 tablet (25 mg total) by mouth 3 (three) times daily as needed for dizziness. 08/30/21   Vladimir Crofts, MD  meloxicam (MOBIC) 15 MG tablet Take 15 mg by mouth daily. 09/19/21   [provider]  methocarbamol (ROBAXIN) 750 MG tablet Take 750 mg by mouth 2 (two) times daily. 09/22/21   [provider]  omeprazole (PRILOSEC OTC) 20 MG tablet Take 1 tablet (20 mg total) by mouth daily. 06/01/21 06/01/22  Blake Divine, MD  predniSONE (DELTASONE) 50 MG tablet Take 1 pill daily for 5 days 03/26/22   Rada Hay, MD  rOPINIRole (REQUIP) 1 MG tablet Take 1-3 mg by mouth See admin instructions. Take 1 tablet ('1mg'$ ) by mouth every morning and take 3 tablets ('3mg'$ ) by mouth every night at bedtime    [provider]  SYMBICORT 160-4.5 MCG/ACT inhaler Inhale 2  puffs into the lungs 2 (two) times daily. 05/25/20   [provider]    Physical Exam: Vitals:   04/09/22 1256 04/09/22 1258 04/09/22 1416 04/09/22 1432  BP: 123/80   (!) 140/103  Pulse: (!) 140   (!) 124  Resp: (!) 22   (!) 22  Temp: 99.6 F (37.6 C)     TempSrc: Oral     SpO2: 93%   91%  Weight:  89.8 kg 89.8 kg   Height:   '5\' 5"'$  (1.651 m)    Physical Exam Vitals and nursing note reviewed.  Constitutional:      General: She is not in acute distress.    Appearance: She is normal weight. She is not toxic-appearing.  HENT:     Head: Normocephalic and atraumatic.     Mouth/Throat:     Mouth: Mucous membranes are moist.     Pharynx: Oropharynx is clear.  Eyes:  Extraocular Movements: Extraocular movements intact.     Pupils: Pupils are equal, round, and reactive to light.  Cardiovascular:     Rate and Rhythm: Regular rhythm. Tachycardia present.     Heart sounds: No murmur heard.    No gallop.  Pulmonary:     Effort: Tachypnea present. No respiratory distress.     Breath sounds: Wheezing (Diffuse expiratory wheezing intermittently throughout) and rales (Bibasilar rales that extend throughout the left middle and upper lung fields as well.  Right middle and upper lung fields are clear.) present.  Abdominal:     General: Bowel sounds are normal.     Palpations: Abdomen is soft.  Musculoskeletal:     Cervical back: Neck supple.     Right lower leg: No tenderness. No edema.     Left lower leg: No tenderness. No edema.  Skin:    General: Skin is warm and dry.  Neurological:     General: No focal deficit present.     Mental Status: She is alert and oriented to person, place, and time.  Psychiatric:        Mood and Affect: Mood normal.        Behavior: Behavior normal.    Data Reviewed: CBC with WBC of 13.7, hemoglobin of 14.7, and platelets of 254. BMP with bicarb 21, glucose of 132, BUN of 18, creatinine of 1.11, with GFR 58. Hepatic function panel with no LFT  elevations. Procalcitonin less than 0.10 D-dimer within normal limits at less than 0.27 Initial troponin within normal limits at 3 Initial lactic acid elevated at 2.8 with normalization on repeat to 1.3. BNP normal at 8.9  DG Chest 2 View  Result Date: 04/09/2022 CLINICAL DATA:  Persistent cough 2 weeks.  Shortness of breath. EXAM: CHEST - 2 VIEW COMPARISON:  03/26/2022 and 10/03/2021 FINDINGS: Lungs are hypoinflated with subtle patchy density in the lung bases without significant change and may be due to atelectasis or early infection. No effusion. Cardiomediastinal silhouette and remainder of the exam is unchanged. IMPRESSION: Hypoinflation with subtle patchy density in the lung bases likely infection. Electronically Signed   By: Marin Olp M.D.   On: 04/09/2022 13:33    Results are pending, will review when available.  Assessment and Plan: * Acute hypoxic respiratory failure [facility]) Ms. Lau is presenting with 1 day history of shortness of breath, dyspnea on exertion, and severe nonproductive cough.  This is in the setting of recent COVID-19 infection with concern for underlying pneumonia treated with Prednisone, Augmentin and Doxycycline.  Given her symptoms initially improved and then worsened, initial concern was for post viral CAP, however her procalcitonin is negative which would be unexpected.  Troponin, BNP and D-dimer are all negative so low likelihood of cardiac etiology. Differential includes COPD/Asthma exacerbation as well, however patient has been on steroids the last 2 weeks. There is some wheezing on examination.   - Continue supplemental oxygen to maintain oxygen saturation > 88% - Wean as tolerated - CT chest ordered - Tussionex twice daily - Continue broad-spectrum antibiotic coverage pending CT chest results - RVP panel pending - S/p Solu-Medrol 125 mg - Start prednisone 60 mg daily tomorrow - Duoneb every 6 hours - If workup is largely unrevealing, could  consider pulmonology consultation  Acquired hypothyroidism - Continue home Synthroid   Alcohol use disorder, severe, dependence ([place]) - CIWA without Ativan  Advance Care Planning:   Code Status: Full Code   Consults: None  Family Communication: No family at  bedside  Severity of Illness: The appropriate patient status for this patient is OBSERVATION. Observation status is judged to be reasonable and necessary in order to provide the required intensity of service to ensure the patient's safety. The patient's presenting symptoms, physical exam findings, and initial radiographic and laboratory data in the context of their medical condition is felt to place them at decreased risk for further clinical deterioration. Furthermore, it is anticipated that the patient will be medically stable for discharge from the hospital within 2 midnights of admission.   Author: Jose Persia, MD 04/09/2022 9:05 PM  For on call review [email or web address].

## 2022-04-09 NOTE — Assessment & Plan Note (Addendum)
Synthroid 

## 2022-04-09 NOTE — ED Triage Notes (Signed)
Says she  had pneumonia 2 weeks ago.  Yesterday started having a continuous cough.  Says she feel sob.  Has oxygen at home.  Tried tessalon pearl for it but no help.  She is continuously coughing. Non productive.

## 2022-04-09 NOTE — Progress Notes (Signed)
Elink following code sepsis °

## 2022-04-09 NOTE — ED Provider Triage Note (Signed)
Emergency Medicine Provider Triage Evaluation Note  Jenna Tyler , a 56 y.o. female  was evaluated in triage.  Pt complains of persistent cough today. She had pneumonia 2 weeks ago. Cough uncontrollable.  Physical Exam  BP 123/80   Pulse (!) 140   Temp 99.6 F (37.6 C) (Oral)   Resp (!) 22 Comment: continuous cough  Wt 89.8 kg   SpO2 93%   BMI 32.95 kg/m  Gen:   Awake, no distress   Resp:  Normal effort  MSK:   Moves extremities without difficulty  Other:    Medical Decision Making  Medically screening exam initiated at 1:01 PM.  Appropriate orders placed.  [name] was informed that the remainder of the evaluation will be completed by another provider, this initial triage assessment does not replace that evaluation, and the importance of remaining in the ED until their evaluation is complete.  DuoNeb, labs, and chest xray ordered.   Victorino Dike, FNP 04/09/22 1305

## 2022-04-10 ENCOUNTER — Encounter: Payer: Self-pay | Admitting: Internal Medicine

## 2022-04-10 DIAGNOSIS — J129 Viral pneumonia, unspecified: Secondary | ICD-10-CM | POA: Diagnosis present

## 2022-04-10 DIAGNOSIS — E669 Obesity, unspecified: Secondary | ICD-10-CM | POA: Diagnosis present

## 2022-04-10 DIAGNOSIS — Z888 Allergy status to other drugs, medicaments and biological substances status: Secondary | ICD-10-CM | POA: Diagnosis not present

## 2022-04-10 DIAGNOSIS — J189 Pneumonia, unspecified organism: Secondary | ICD-10-CM | POA: Diagnosis present

## 2022-04-10 DIAGNOSIS — Z79890 Hormone replacement therapy (postmenopausal): Secondary | ICD-10-CM | POA: Diagnosis not present

## 2022-04-10 DIAGNOSIS — Z79899 Other long term (current) drug therapy: Secondary | ICD-10-CM | POA: Diagnosis not present

## 2022-04-10 DIAGNOSIS — Z7951 Long term (current) use of inhaled steroids: Secondary | ICD-10-CM | POA: Diagnosis not present

## 2022-04-10 DIAGNOSIS — F1020 Alcohol dependence, uncomplicated: Secondary | ICD-10-CM | POA: Diagnosis present

## 2022-04-10 DIAGNOSIS — E039 Hypothyroidism, unspecified: Secondary | ICD-10-CM | POA: Diagnosis not present

## 2022-04-10 DIAGNOSIS — E890 Postprocedural hypothyroidism: Secondary | ICD-10-CM | POA: Diagnosis present

## 2022-04-10 DIAGNOSIS — Z8616 Personal history of COVID-19: Secondary | ICD-10-CM | POA: Diagnosis not present

## 2022-04-10 DIAGNOSIS — J9601 Acute respiratory failure with hypoxia: Secondary | ICD-10-CM | POA: Diagnosis present

## 2022-04-10 DIAGNOSIS — Z90710 Acquired absence of both cervix and uterus: Secondary | ICD-10-CM | POA: Diagnosis not present

## 2022-04-10 DIAGNOSIS — I10 Essential (primary) hypertension: Secondary | ICD-10-CM | POA: Diagnosis present

## 2022-04-10 DIAGNOSIS — Z803 Family history of malignant neoplasm of breast: Secondary | ICD-10-CM | POA: Diagnosis not present

## 2022-04-10 DIAGNOSIS — Z886 Allergy status to analgesic agent status: Secondary | ICD-10-CM | POA: Diagnosis not present

## 2022-04-10 DIAGNOSIS — Z87891 Personal history of nicotine dependence: Secondary | ICD-10-CM | POA: Diagnosis not present

## 2022-04-10 DIAGNOSIS — J440 Chronic obstructive pulmonary disease with acute lower respiratory infection: Secondary | ICD-10-CM | POA: Diagnosis present

## 2022-04-10 DIAGNOSIS — Z880 Allergy status to penicillin: Secondary | ICD-10-CM | POA: Diagnosis not present

## 2022-04-10 DIAGNOSIS — Z6832 Body mass index (BMI) 32.0-32.9, adult: Secondary | ICD-10-CM | POA: Diagnosis not present

## 2022-04-10 DIAGNOSIS — R0602 Shortness of breath: Secondary | ICD-10-CM | POA: Diagnosis present

## 2022-04-10 DIAGNOSIS — J441 Chronic obstructive pulmonary disease with (acute) exacerbation: Secondary | ICD-10-CM | POA: Diagnosis present

## 2022-04-10 DIAGNOSIS — E785 Hyperlipidemia, unspecified: Secondary | ICD-10-CM | POA: Diagnosis present

## 2022-04-10 LAB — RESPIRATORY PANEL BY PCR

## 2022-04-10 LAB — BASIC METABOLIC PANEL
Anion gap: 6 (ref 5–15)
BUN: 14 mg/dL (ref 6–20)
CO2: 22 mmol/L (ref 22–32)
Calcium: 8.3 mg/dL — ABNORMAL LOW (ref 8.9–10.3)
Chloride: 109 mmol/L (ref 98–111)
Creatinine, Ser: 0.84 mg/dL (ref 0.44–1.00)
GFR, Estimated: >60 mL/min (ref >60)
Glucose, Bld: 163 mg/dL — ABNORMAL HIGH (ref 70–99)
Potassium: 4.3 mmol/L (ref 3.5–5.1)
Sodium: 137 mmol/L (ref 135–145)

## 2022-04-10 LAB — CBC WITH DIFFERENTIAL/PLATELET
Abs Immature Granulocytes: 0.15 10*3/uL — ABNORMAL HIGH (ref 0.00–0.07)
Basophils Absolute: 0 10*3/uL (ref 0.0–0.1)
Basophils Relative: 0 %
Eosinophils Absolute: 0 10*3/uL (ref 0.0–0.5)
Eosinophils Relative: 0 %
HCT: 39.5 % (ref 36.0–46.0)
Hemoglobin: 13.1 g/dL (ref 12.0–15.0)
Immature Granulocytes: 2 %
Lymphocytes Relative: 9 %
Lymphs Abs: 0.8 10*3/uL (ref 0.7–4.0)
MCH: 28.9 pg (ref 26.0–34.0)
MCHC: 33.2 g/dL (ref 30.0–36.0)
MCV: 87.2 fL (ref 80.0–100.0)
Monocytes Absolute: 0.3 10*3/uL (ref 0.1–1.0)
Monocytes Relative: 3 %
Neutro Abs: 7.8 10*3/uL — ABNORMAL HIGH (ref 1.7–7.7)
Neutrophils Relative %: 86 %
Platelets: 234 10*3/uL (ref 150–400)
RBC: 4.53 MIL/uL (ref 3.87–5.11)
RDW: 14.8 % (ref 11.5–15.5)
WBC: 9.1 10*3/uL (ref 4.0–10.5)
nRBC: 0 % (ref 0.0–0.2)

## 2022-04-10 LAB — HIV ANTIBODY (ROUTINE TESTING W REFLEX): HIV Screen 4th Generation wRfx: NONREACTIVE

## 2022-04-10 MED ORDER — BENZONATATE 100 MG PO CAPS
200.0000 mg | ORAL_CAPSULE | Freq: Three times a day (TID) | ORAL | Status: DC
  Administered 2022-04-10 – 2022-04-11 (×3): 200 mg via ORAL
  Filled 2022-04-10 (×4): qty 2

## 2022-04-10 MED ORDER — TRAMADOL HCL 50 MG PO TABS
50.0000 mg | ORAL_TABLET | Freq: Four times a day (QID) | ORAL | Status: DC | PRN
  Administered 2022-04-10 – 2022-04-11 (×2): 50 mg via ORAL
  Filled 2022-04-10 (×2): qty 1

## 2022-04-10 NOTE — Progress Notes (Signed)
[facility] NAME: Jenna Tyler    MR#:  [id number]  DATE OF BIRTH:  12-Jun-1965  SUBJECTIVE:   Patient came in with dry cough. No fever. Had some shortness of breath. Has history of COPD. Recently admitted and discharged completed a course of antibiotic with Augmentin/doxycycline. Was tested positive for COVID at that time. Complains of dry cough and rib pain. Patient not on oxygen at home   VITALS:  Blood pressure 124/77, pulse 68, temperature (!) 97.3 F (36.3 C), temperature source Oral, resp. rate 17, height '5\' 5"'$  (1.651 m), weight 89.8 kg, SpO2 93 %.  PHYSICAL EXAMINATION:   GENERAL:  56 y.o.-year-old patient lying in the bed with no acute distress.  LUNGS: decreased breath sounds bilaterally, no wheezing CARDIOVASCULAR: S1, S2 normal. No murmur   ABDOMEN: Soft, nontender, nondistended. Bowel sounds present.  EXTREMITIES: No  edema b/l.    NEUROLOGIC: nonfocal  patient is alert and awake SKIN: No obvious rash, lesion, or ulcer.   LABORATORY PANEL:  CBC Recent Labs  Lab 04/10/22 0509  WBC 9.1  HGB 13.1  HCT 39.5  PLT 234    Chemistries  Recent Labs  Lab 04/09/22 1534 04/10/22 0509  NA  --  137  K  --  4.3  CL  --  109  CO2  --  22  GLUCOSE  --  163*  BUN  --  14  CREATININE  --  0.84  CALCIUM  --  8.3*  AST 29  --   ALT 24  --   ALKPHOS 91  --   BILITOT 0.6  --    Cardiac Enzymes No results for input(s): "TROPONINI" in the last 168 hours. RADIOLOGY:  CT CHEST WO CONTRAST  Result Date: 04/09/2022 CLINICAL DATA:  Respiratory illness EXAM: CT CHEST WITHOUT CONTRAST TECHNIQUE: Multidetector CT imaging of the chest was performed following the standard protocol without IV contrast. RADIATION DOSE REDUCTION: This exam was performed according to the departmental dose-optimization program which includes automated exposure control, adjustment of the mA and/or kV according to patient size and/or use of  iterative reconstruction technique. COMPARISON:  Chest x-ray 04/09/2022, CT chest 10/04/2021 FINDINGS: Cardiovascular: Limited evaluation without intravenous contrast. Mild aortic atherosclerosis. No aneurysm. Normal cardiac size. Small pericardial effusion Mediastinum/Nodes: Midline trachea. No thyroid mass. No suspicious lymph nodes. Esophagus within normal limits. Lungs/Pleura: Calcified granuloma in the left lower lobe. Heterogeneous bilateral ground-glass densities. Upper Abdomen: No acute abnormality. Musculoskeletal: No chest wall mass or suspicious bone lesions identified. IMPRESSION: 1. Heterogeneous fairly widespread bilateral ground-glass densities, suspicious for bilateral pneumonia, possibly viral. 2. Small pericardial effusion. Aortic Atherosclerosis (ICD10-I70.0). Electronically Signed   By: Donavan Foil M.D.   On: 04/09/2022 21:21   DG Chest 2 View  Result Date: 04/09/2022 CLINICAL DATA:  Persistent cough 2 weeks.  Shortness of breath. EXAM: CHEST - 2 VIEW COMPARISON:  03/26/2022 and 10/03/2021 FINDINGS: Lungs are hypoinflated with subtle patchy density in the lung bases without significant change and may be due to atelectasis or early infection. No effusion. Cardiomediastinal silhouette and remainder of the exam is unchanged. IMPRESSION: Hypoinflation with subtle patchy density in the lung bases likely infection. Electronically Signed   By: Marin Olp M.D.   On: 04/09/2022 13:33    Assessment and Plan  Jenna Tyler is a 56 y.o. female with medical history significant of COPD-asthma overlap, hypertension, hypothyroidism, polysubstance abuse (alcohol, opioids), who presents to the ED with complaints  of cough and shortness of breath. Patient was seen in the emergency room on fourth December, she tested positive for COVID-19.  She was discharged home on doxycycline, Augmentin, prednisone and Tussionex.  She states she was unable to walk short distances due to significance of  dyspnea   COPD exacerbation  Suspected Viral Pneumonia -- patient said she had transient hypoxia at home with sats 88%. --Currently she's on 2 L oxygen.  -- assess for home oxygen need  -- clinically does not appear to be pneumonia. Will discontinue antibiotic. She recently just finished course of Augmentin and doxycycline -- white count normal, lactic acid normal, no consolidation, pro calcitonin negative -- PO steroids, bronchodilator, nebulizer -- PRN cough medicine.  Hypothyroidism -- continue Synthroid  Hypertension -- on beta-blocker   CODE STATUS: full DVT Prophylaxis : Lovenox Level of care: Telemetry Medical Status is: Inpatient Remains inpatient appropriate because: COPD exacerbation. If remains stable discharge home. Assess for home oxygen prior to discharge.    TOTAL TIME TAKING CARE OF THIS PATIENT: 35 minutes.  >50% time spent on counselling and coordination of care  Note: This dictation was prepared with Dragon dictation along with smaller phrase technology. Any transcriptional errors that result from this process are unintentional.  Fritzi Mandes M.D    [facility]   CC: Primary care physician; [facility]

## 2022-04-11 DIAGNOSIS — J129 Viral pneumonia, unspecified: Secondary | ICD-10-CM | POA: Diagnosis not present

## 2022-04-11 DIAGNOSIS — J9601 Acute respiratory failure with hypoxia: Secondary | ICD-10-CM | POA: Diagnosis not present

## 2022-04-11 DIAGNOSIS — F1020 Alcohol dependence, uncomplicated: Secondary | ICD-10-CM | POA: Diagnosis not present

## 2022-04-11 DIAGNOSIS — E039 Hypothyroidism, unspecified: Secondary | ICD-10-CM | POA: Diagnosis not present

## 2022-04-11 DIAGNOSIS — E669 Obesity, unspecified: Secondary | ICD-10-CM

## 2022-04-11 MED ORDER — PREDNISONE 10 MG PO TABS: ORAL_TABLET | ORAL | 0 refills | Status: DC

## 2022-04-11 MED ORDER — HYDROCOD POLI-CHLORPHE POLI ER 10-8 MG/5ML PO SUER: 5.0000 mL | Freq: Two times a day (BID) | ORAL | 0 refills | Status: DC | PRN

## 2022-04-11 MED ORDER — TRAMADOL HCL 50 MG PO TABS: 50.0000 mg | ORAL_TABLET | Freq: Two times a day (BID) | ORAL | 0 refills | Status: DC | PRN

## 2022-04-11 MED ORDER — FOLIC ACID 1 MG PO TABS: 1.0000 mg | ORAL_TABLET | Freq: Every day | ORAL | 0 refills | Status: DC

## 2022-04-11 MED ORDER — VITAMIN B-1 100 MG PO TABS: 100.0000 mg | ORAL_TABLET | Freq: Every day | ORAL | 0 refills | Status: DC

## 2022-04-11 NOTE — Assessment & Plan Note (Signed)
BMI 32.94

## 2022-04-11 NOTE — TOC Initial Note (Signed)
Transition of Care [facility]) - Initial/Assessment Note    Patient Details  Name: Jenna Tyler MRN: [id number] Date of Birth: 1965-08-16  Transition of Care [facility]) CM/SW Contact:    Laurena Slimmer, RN Phone Number: 04/11/2022, 10:26 AM  Clinical Narrative:                  Transition of Care [facility]) Screening Note   Patient Details  Name: Jenna Tyler Date of Birth: [birth year]   Transition of Care [facility]) CM/SW Contact:    Laurena Slimmer, RN Phone Number: 04/11/2022, 10:26 AM    Transition of Care Department (TOC) has reviewed patient and no TOC needs have been identified at this time. We will continue to monitor patient advancement through interdisciplinary progression rounds. If new patient transition needs arise, please place a TOC consult.          Patient Goals and CMS Choice            Expected Discharge Plan and Services                                                Prior Living Arrangements/Services                       Activities of Daily Living Home Assistive Devices/Equipment: Eyeglasses ADL Screening (condition at time of admission) Patient's cognitive ability adequate to safely complete daily activities?: Yes Is the patient deaf or have difficulty hearing?: No Does the patient have difficulty seeing, even when wearing glasses/contacts?: No Does the patient have difficulty concentrating, remembering, or making decisions?: No Patient able to express need for assistance with ADLs?: Yes Does the patient have difficulty dressing or bathing?: No Independently performs ADLs?: Yes (appropriate for developmental age) Does the patient have difficulty walking or climbing stairs?: Yes Weakness of Legs: Both Weakness of Arms/Hands: None  Permission Sought/Granted                  Emotional Assessment              Admission diagnosis:  COPD exacerbation ([place]) [J44.1] CAP (community acquired pneumonia)  [J18.9] Acute respiratory failure with hypoxemia ([place]) [J96.01] Community acquired pneumonia, unspecified laterality [J18.9] Patient Active Problem List   Diagnosis Date Noted   CAP (community acquired pneumonia) 04/10/2022   Acute respiratory failure with hypoxia ([place]) 07/17/2020   Chest wall pain 07/17/2020   Abdominal pain 04/07/2020   Adaptation reaction 07/14/2019   Allergic rhinitis, seasonal 07/14/2019   Breast lump 07/14/2019   Cephalalgia 07/14/2019   Chronic pain associated with significant psychosocial dysfunction 07/14/2019   Encounter for screening for lipoid disorders 07/14/2019   Feeling bilious 07/14/2019   Feeling stressed out 07/14/2019   Immunization, tetanus toxoid 07/14/2019   Infection of the upper respiratory tract 07/14/2019   Urethra, 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   Uterine spasm 07/14/2019   Breath shortness 07/14/2019   Breathlessness on exertion 07/14/2019   SOB (shortness of breath)    Severe sepsis ([place]) 07/10/2019   Easy bruising 07/02/2019   Polycythemia 07/02/2019   Tremor 05/29/2019   Cognitive decline 09/29/2018   Acute hypoxic respiratory failure ([place]) 08/28/2018   Seizures ([place]) 01/15/2018   Leg pain 10/18/2017   Lower extremity weakness 10/18/2017   Weakness 09/16/2017   Lower extremity pain, bilateral 09/10/2017   Other symptoms  and signs involving the musculoskeletal system 07/26/2017   AKI (acute kidney injury) ([place]) 07/12/2017   HTN (hypertension) 12/29/2016   COPD exacerbation ([place]) 12/29/2016   Substance induced mood disorder ([place]) 12/29/2016   Alcohol use disorder, severe, dependence ([place]) 12/27/2016   Major depressive disorder, 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   Vomiting of fecal matter 08/15/2016   Globus sensation 08/15/2016   Multiple somatic complaints 08/15/2016   Multiple duodenal ulcers 01/24/2016   Prediabetes 07/07/2015   Neurofibromatosis, 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   Acquired hypothyroidism  04/04/2015   Pre-diabetes 04/04/2015   Vitamin D deficiency 04/04/2015   Borderline diabetes mellitus 04/04/2015   Anxiety 10/12/2014   Anxiety, generalized 10/12/2014   H/O: hypothyroidism 10/12/2014   Insomnia, persistent 10/12/2014   Cannot sleep 10/12/2014   Depression, major, recurrent, moderate ([place]) 10/12/2014   Drug abuse, opioid type ([place]) 10/12/2014   Nondependent opioid abuse in remission ([place]) 10/12/2014   Generalized anxiety disorder 10/12/2014   Moderate episode of recurrent major depressive disorder ([place]) 10/12/2014   Opioid abuse ([place]) 10/12/2014   History of hypothyroidism 10/12/2014   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   Severe protein-calorie malnutrition ([place]) 07/30/2014   Nausea & vomiting 07/07/2014   Hypothyroidism, unspecified 07/07/2014   Depression with anxiety 07/07/2014   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/2013   Lipoma of abdominal wall 09/15/2013   Pleurisy 08/17/2010   Vomiting and diarrhea 01/23/2010   Cough 11/24/2009   Postmenopausal atrophic vaginitis 11/21/2009   Lumbar back sprain 10/10/2009   Clinical depression 07/12/2009   Arthralgia of multiple joints 06/30/2009   Benign neoplasm of thyroid gland 05/27/2009   Fast heart beat 05/10/2009   Persistent insomnia 02/07/2009   Mixed disorder as reaction to stress 10/09/2008   Boil of eyelid 08/01/2007   Chest pain on breathing 06/03/2007   LBP (low back pain) 03/17/2007   Epigastric pain 11/27/2006   Hypoglycemia 11/06/2005   Addiction, opium ([place]) 04/23/2002   Headache, migraine 04/23/1998   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   PCP:  [facility]:   [place], [place] - [address] [place] [address] Phone: [phone] Fax: [phone]  [place], [place] - [address] [place] [address] Phone: [phone] Fax:  [phone]  [id number] - [place], [place] - [address] AT [place] [address] Phone: [phone] Fax: [phone]     Social Determinants of Health (SDOH) Social History: SDOH Screenings   Food Insecurity: No Food Insecurity (04/10/2022)  Housing: Low Risk  (04/10/2022)  Transportation Needs: No Transportation Needs (04/10/2022)  Utilities: Not At Risk (04/10/2022)  Alcohol Screen: Medium Risk (03/23/2017)  Financial Resource Strain: High Risk (09/16/2017)  Physical Activity: Insufficiently Active (09/16/2017)  Social Connections: Unknown (09/16/2017)  Stress: Stress Concern Present (09/16/2017)  Tobacco Use: Medium Risk (04/10/2022)   SDOH Interventions:     Readmission Risk Interventions     No data to display

## 2022-04-11 NOTE — Assessment & Plan Note (Signed)
Procalcitonin was negative.  Patient recently had a COVID 19 infection.  Recently completed Augmentin and doxycycline.

## 2022-04-11 NOTE — Discharge Summary (Signed)
Physician Discharge Summary   Patient: Jenna Tyler MRN: [id number] DOB: 1966-01-21  Admit date:     04/09/2022  Discharge date: 04/11/22  Discharge Physician: Loletha Grayer   PCP: [facility]   Recommendations at discharge:   Follow-up PCP 5 days  Discharge Diagnoses: Principal Problem:   Acute hypoxic respiratory failure (HCC) Active Problems:   Viral pneumonia   Acquired hypothyroidism   Alcohol use disorder, severe, dependence ([place])   CAP (community acquired pneumonia)   Obesity (BMI 30-39.9)    Hospital Course: Patient was admitted to the hospital on 04/09/2022 and discharged on 04/11/2022.  Patient came in with cough and shortness of breath.  She was admitted with acute hypoxic respiratory failure.  Patient had a recent COVID-19 infection.  CT scan of the chest showed widespread bilateral groundglass densities suspicious for bilateral pneumonia likely viral.  The patient was started on steroids and was tapered off oxygen.  She was doing better.  Still has a little bit of cough which improved with Tussionex.  Antibiotics were discontinued and procalcitonin was negative.  Assessment and Plan: * Acute hypoxic respiratory failure (HCC) Pulse ox at home 88%.  Patient was on oxygen during the hospital stay.  On the day of discharge pulse ox remained above 90% with ambulation.  This problem has resolved.  Will complete a steroid taper.    Viral pneumonia Procalcitonin was negative.  Patient recently had a COVID 19 infection.  Recently completed Augmentin and doxycycline.  Acquired hypothyroidism Synthroid   Alcohol use disorder, severe, dependence (HCC) No signs of withdrawal.  Prescribe thiamine and folic acid upon disposition.  Obesity (BMI 30-39.9) BMI 32.94         Consultants: None Procedures performed: None Disposition: Home Diet recommendation:  Cardiac diet DISCHARGE MEDICATION: Allergies as of 04/11/2022        Reactions   Nsaids Other (See Comments)   Internal bleeding  Intestinal bleeding   Paxlovid [nirmatrelvir-ritonavir]    Tizanidine Other (See Comments)   Visual hallucinations   Adhesive [tape] Rash   Aspirin Other (See Comments)   Cannot take due to ulcers.   Penicillins Rash   Has patient had a PCN reaction causing immediate rash, facial/tongue/throat swelling, SOB or lightheadedness with hypotension: Yes Has patient had a PCN reaction causing severe rash involving mucus membranes or skin necrosis: No Has patient had a PCN reaction that required hospitalization: No Has patient had a PCN reaction occurring within the last 10 years: Yes If all of the above answers are "NO", then may proceed with Cephalosporin use. Patient tolerated ANCEF administration         Medication List     TAKE these medications    albuterol 108 (90 Base) MCG/ACT inhaler Commonly known as: VENTOLIN HFA Inhale 1-2 puffs into the lungs every 4 (four) hours as needed.   amitriptyline 100 MG tablet Commonly known as: ELAVIL Take 100 mg by mouth at bedtime.   benzonatate 100 MG capsule Commonly known as: TESSALON Take 200 mg by mouth 3 (three) times daily as needed.   butalbital-acetaminophen-caffeine 50-325-40 MG tablet Commonly known as: FIORICET Take 1 tablet by mouth every 4 (four) hours as needed for headache.   chlorpheniramine-HYDROcodone 10-8 MG/5ML Commonly known as: TUSSIONEX Take 5 mLs by mouth every 12 (twelve) hours as needed.   folic acid 1 MG tablet Commonly known as: FOLVITE Take 1 tablet (1 mg total) by mouth daily. Start taking on: April 12, 2022  gabapentin 800 MG tablet Commonly known as: NEURONTIN Take 800 mg by mouth 4 (four) times daily.   levothyroxine 50 MCG tablet Commonly known as: SYNTHROID Take 50 mcg by mouth daily.   methocarbamol 750 MG tablet Commonly known as: ROBAXIN Take 750 mg by mouth 2 (two) times daily.   metoprolol tartrate 25 MG  tablet Commonly known as: LOPRESSOR Take 25 mg by mouth 2 (two) times daily.   predniSONE 10 MG tablet Commonly known as: DELTASONE 4 tabs po day 1; 3 tabs po day2; 2 tabs po day3; 1 tab po day4; 1/2 tab po day5,6 Start taking on: April 12, 2022   rOPINIRole 0.5 MG tablet Commonly known as: REQUIP Take 1 mg by mouth 2 (two) times daily.   Symbicort 160-4.5 MCG/ACT inhaler Generic drug: budesonide-formoterol Inhale 2 puffs into the lungs 2 (two) times daily.   thiamine 100 MG tablet Commonly known as: Vitamin B-1 Take 1 tablet (100 mg total) by mouth daily. Start taking on: April 12, 2022   traMADol 50 MG tablet Commonly known as: ULTRAM Take 1 tablet (50 mg total) by mouth 2 (two) times daily as needed.        Follow-up Information     [facility] in 5 day(s).   Why: First available is 04/24/22 11:00 AM Contact information: [address] [place] [address] [phone]         Erby Pian, MD. Go in 1 week(s).   Specialty: Specialist Why: 04/26/21 9:45 AM Contact information: [place] [id number] [phone]                Discharge Exam: Filed Weights   04/09/22 1258 04/09/22 1416  Weight: 89.8 kg 89.8 kg   Physical Exam HENT:     Head: Normocephalic.     Mouth/Throat:     Pharynx: No oropharyngeal exudate.  Eyes:     General: Lids are normal.     Conjunctiva/sclera: Conjunctivae normal.  Cardiovascular:     Rate and Rhythm: Normal rate and regular rhythm.     Heart sounds: Normal heart sounds, S1 normal and S2 normal.  Pulmonary:     Breath sounds: Examination of the right-lower field reveals decreased breath sounds. Examination of the left-lower field reveals decreased breath sounds. Decreased breath sounds present. No wheezing, rhonchi or rales.  Abdominal:     Palpations: Abdomen is soft.     Tenderness: There is no abdominal tenderness.  Musculoskeletal:     Right lower  leg: No swelling.  Skin:    General: Skin is warm.     Findings: No rash.  Neurological:     Mental Status: She is alert and oriented to person, place, and time.      Condition at discharge: stable  The results of significant diagnostics from this hospitalization (including imaging, microbiology, ancillary and laboratory) are listed below for reference.   Imaging Studies: CT CHEST WO CONTRAST  Result Date: 04/09/2022 CLINICAL DATA:  Respiratory illness EXAM: CT CHEST WITHOUT CONTRAST TECHNIQUE: Multidetector CT imaging of the chest was performed following the standard protocol without IV contrast. RADIATION DOSE REDUCTION: This exam was performed according to the departmental dose-optimization program which includes automated exposure control, adjustment of the mA and/or kV according to patient size and/or use of iterative reconstruction technique. COMPARISON:  Chest x-ray 04/09/2022, CT chest 10/04/2021 FINDINGS: Cardiovascular: Limited evaluation without intravenous contrast. Mild aortic atherosclerosis. No aneurysm. Normal cardiac size. Small pericardial  effusion Mediastinum/Nodes: Midline trachea. No thyroid mass. No suspicious lymph nodes. Esophagus within normal limits. Lungs/Pleura: Calcified granuloma in the left lower lobe. Heterogeneous bilateral ground-glass densities. Upper Abdomen: No acute abnormality. Musculoskeletal: No chest wall mass or suspicious bone lesions identified. IMPRESSION: 1. Heterogeneous fairly widespread bilateral ground-glass densities, suspicious for bilateral pneumonia, possibly viral. 2. Small pericardial effusion. Aortic Atherosclerosis (ICD10-I70.0). Electronically Signed   By: Donavan Foil M.D.   On: 04/09/2022 21:21   DG Chest 2 View  Result Date: 04/09/2022 CLINICAL DATA:  Persistent cough 2 weeks.  Shortness of breath. EXAM: CHEST - 2 VIEW COMPARISON:  03/26/2022 and 10/03/2021 FINDINGS: Lungs are hypoinflated with subtle patchy density in the lung  bases without significant change and may be due to atelectasis or early infection. No effusion. Cardiomediastinal silhouette and remainder of the exam is unchanged. IMPRESSION: Hypoinflation with subtle patchy density in the lung bases likely infection. Electronically Signed   By: Marin Olp M.D.   On: 04/09/2022 13:33   DG Chest 2 View  Result Date: 03/26/2022 CLINICAL DATA:  Cough.  Positive exposure to COVID. EXAM: CHEST - 2 VIEW COMPARISON:  10/03/2021 FINDINGS: Heart size is normal. No pleural effusion or edema. Right middle lobe airspace visualized osseous structures are unremarkable. Opacity is best seen on the lateral projection radiograph. No additional airspace opacities identified. IMPRESSION: Right middle lobe airspace opacity compatible with pneumonia. Electronically Signed   By: Kerby Moors M.D.   On: 03/26/2022 09:38    Microbiology: Results for orders placed or performed during the hospital encounter of 04/09/22  Resp panel by RT-PCR (RSV, Flu A&B, Covid) Anterior Nasal Swab     Status: None   Collection Time: 04/09/22  2:43 PM   Specimen: Anterior Nasal Swab  Result Value Ref Range Status   SARS Coronavirus 2 by RT PCR NEGATIVE NEGATIVE Final    Comment: (NOTE) SARS-CoV-2 target nucleic acids are NOT DETECTED.  The SARS-CoV-2 RNA is generally detectable in upper respiratory specimens during the acute phase of infection. The lowest concentration of SARS-CoV-2 viral copies this assay can detect is 138 copies/mL. A negative result does not preclude SARS-Cov-2 infection and should not be used as the sole basis for treatment or other patient management decisions. A negative result may occur with  improper specimen collection/handling, submission of specimen other than nasopharyngeal swab, presence of viral mutation(s) within the areas targeted by this assay, and inadequate number of viral copies(<138 copies/mL). A negative result must be combined with clinical  observations, patient history, and epidemiological information. The expected result is Negative.  Fact Sheet for Patients:  EntrepreneurPulse.com.au  Fact Sheet for Healthcare Providers:  IncredibleEmployment.be  This test is no t yet approved or cleared by the [place] FDA and  has been authorized for detection and/or diagnosis of SARS-CoV-2 by FDA under an Emergency Use Authorization (EUA). This EUA will remain  in effect (meaning this test can be used) for the duration of the COVID-19 declaration under Section 564(b)(1) of the Act, 21 U.S.C.section 360bbb-3(b)(1), unless the authorization is terminated  or revoked sooner.       Influenza A by PCR NEGATIVE NEGATIVE Final   Influenza B by PCR NEGATIVE NEGATIVE Final    Comment: (NOTE) The Xpert Xpress SARS-CoV-2/FLU/RSV plus assay is intended as an aid in the diagnosis of influenza from Nasopharyngeal swab specimens and should not be used as a sole basis for treatment. Nasal washings and aspirates are unacceptable for Xpert Xpress SARS-CoV-2/FLU/RSV testing.  Fact Sheet for  Patients: EntrepreneurPulse.com.au  Fact Sheet for Healthcare Providers: IncredibleEmployment.be  This test is not yet approved or cleared by the [place] FDA and has been authorized for detection and/or diagnosis of SARS-CoV-2 by FDA under an Emergency Use Authorization (EUA). This EUA will remain in effect (meaning this test can be used) for the duration of the COVID-19 declaration under Section 564(b)(1) of the Act, 21 U.S.C. section 360bbb-3(b)(1), unless the authorization is terminated or revoked.     Resp Syncytial Virus by PCR NEGATIVE NEGATIVE Final    Comment: (NOTE) Fact Sheet for Patients: EntrepreneurPulse.com.au  Fact Sheet for Healthcare Providers: IncredibleEmployment.be  This test is not yet approved or cleared by  the [place] FDA and has been authorized for detection and/or diagnosis of SARS-CoV-2 by FDA under an Emergency Use Authorization (EUA). This EUA will remain in effect (meaning this test can be used) for the duration of the COVID-19 declaration under Section 564(b)(1) of the Act, 21 U.S.C. section 360bbb-3(b)(1), unless the authorization is terminated or revoked.  Performed at [facility], [address] [place], [place] [address]   Blood culture (routine x 2)     Status: None (Preliminary result)   Collection Time: 04/09/22  3:34 PM   Specimen: BLOOD  Result Value Ref Range Status   Specimen Description BLOOD BLOOD RIGHT FOREARM  Final   Special Requests   Final    BOTTLES DRAWN AEROBIC AND ANAEROBIC Blood Culture results may not be optimal due to an excessive volume of blood received in culture bottles   Culture   Final    NO GROWTH 2 DAYS Performed at [facility], [address], [place], [place] [address]    Report Status PENDING  Incomplete  Blood culture (routine x 2)     Status: None (Preliminary result)   Collection Time: 04/09/22  3:34 PM   Specimen: BLOOD  Result Value Ref Range Status   Specimen Description BLOOD BLOOD RIGHT FOREARM  Final   Special Requests   Final    BOTTLES DRAWN AEROBIC AND ANAEROBIC Blood Culture results may not be optimal due to an excessive volume of blood received in culture bottles   Culture   Final    NO GROWTH 2 DAYS Performed at [facility], [address] [place],  [address]    Report Status PENDING  Incomplete  Respiratory (~20 pathogens) panel by PCR     Status: None   Collection Time: 04/09/22 10:51 PM   Specimen: Nasopharyngeal Swab; Respiratory  Result Value Ref Range Status   Adenovirus NOT DETECTED NOT DETECTED Final   Coronavirus 229E NOT DETECTED NOT DETECTED Final    Comment: (NOTE) The Coronavirus on the Respiratory Panel, DOES NOT test for the novel  Coronavirus (2019 nCoV)     Coronavirus HKU1 NOT DETECTED NOT DETECTED Final   Coronavirus NL63 NOT DETECTED NOT DETECTED Final   Coronavirus OC43 NOT DETECTED NOT DETECTED Final   Metapneumovirus NOT DETECTED NOT DETECTED Final   Rhinovirus / Enterovirus NOT DETECTED NOT DETECTED Final   Influenza A NOT DETECTED NOT DETECTED Final   Influenza B NOT DETECTED NOT DETECTED Final   Parainfluenza Virus 1 NOT DETECTED NOT DETECTED Final   Parainfluenza Virus 2 NOT DETECTED NOT DETECTED Final   Parainfluenza Virus 3 NOT DETECTED NOT DETECTED Final   Parainfluenza Virus 4 NOT DETECTED NOT DETECTED Final   Respiratory Syncytial Virus NOT DETECTED NOT DETECTED Final   Bordetella pertussis NOT DETECTED NOT DETECTED Final  Bordetella Parapertussis NOT DETECTED NOT DETECTED Final   Chlamydophila pneumoniae NOT DETECTED NOT DETECTED Final   Mycoplasma pneumoniae NOT DETECTED NOT DETECTED Final    Comment: Performed at [facility], [place] [address], [place], [place] [address]    Labs: CBC: Recent Labs  Lab 04/09/22 1305 04/10/22 0509  WBC 13.7* 9.1  NEUTROABS 11.5* 7.8*  HGB 14.7 13.1  HCT 43.5 39.5  MCV 84.8 87.2  PLT 254 173   Basic Metabolic Panel: Recent Labs  Lab 04/09/22 1443 04/10/22 0509  NA 135 137  K 3.6 4.3  CL 105 109  CO2 21* 22  GLUCOSE 132* 163*  BUN 18 14  CREATININE 1.11* 0.84  CALCIUM 8.7* 8.3*   Liver Function Tests: Recent Labs  Lab 04/09/22 1534  AST 29  ALT 24  ALKPHOS 91  BILITOT 0.6  PROT 6.6  ALBUMIN 3.7   CBG: No results for input(s): "GLUCAP" in the last 168 hours.  Discharge time spent: greater than 30 minutes.  Signed: Loletha Grayer, MD [facility] 04/11/2022

## 2022-04-11 NOTE — Hospital Course (Signed)
Patient was admitted to the hospital on 04/09/2022 and discharged on 04/11/2022.  Patient came in with cough and shortness of breath.  She was admitted with acute hypoxic respiratory failure.  Patient had a recent COVID-19 infection.  CT scan of the chest showed widespread bilateral groundglass densities suspicious for bilateral pneumonia likely viral.  The patient was started on steroids and was tapered off oxygen.  She was doing better.  Still has a little bit of cough which improved with Tussionex.  Antibiotics were discontinued and procalcitonin was negative.

## 2022-04-11 NOTE — Plan of Care (Signed)

## 2022-04-14 LAB — CULTURE, BLOOD (ROUTINE X 2)
Culture: NO GROWTH
Culture: NO GROWTH

## 2022-07-06 ENCOUNTER — Emergency Department: Payer: Medicaid Other

## 2022-07-06 ENCOUNTER — Encounter: Payer: Self-pay | Admitting: Emergency Medicine

## 2022-07-06 ENCOUNTER — Emergency Department
Admission: EM | Admit: 2022-07-06 | Discharge: 2022-07-07 | Disposition: A | Discharge status: Home / Self Care | Payer: Medicaid Other | Attending: Emergency Medicine | Admitting: Emergency Medicine

## 2022-07-06 DIAGNOSIS — J189 Pneumonia, unspecified organism: Secondary | ICD-10-CM

## 2022-07-06 DIAGNOSIS — R221 Localized swelling, mass and lump, neck: Secondary | ICD-10-CM | POA: Insufficient documentation

## 2022-07-06 DIAGNOSIS — Z1152 Encounter for screening for COVID-19: Secondary | ICD-10-CM | POA: Insufficient documentation

## 2022-07-06 DIAGNOSIS — Z87891 Personal history of nicotine dependence: Secondary | ICD-10-CM | POA: Insufficient documentation

## 2022-07-06 LAB — COMPREHENSIVE METABOLIC PANEL
ALT: 22 U/L (ref 0–44)
AST: 23 U/L (ref 15–41)
Albumin: 4.3 g/dL (ref 3.5–5.0)
Alkaline Phosphatase: 89 U/L (ref 38–126)
Anion gap: 9 (ref 5–15)
BUN: 19 mg/dL (ref 6–20)
CO2: 24 mmol/L (ref 22–32)
Calcium: 9.5 mg/dL (ref 8.9–10.3)
Chloride: 104 mmol/L (ref 98–111)
Creatinine, Ser: 0.93 mg/dL (ref 0.44–1.00)
GFR, Estimated: >60 mL/min (ref >60)
Glucose, Bld: 92 mg/dL (ref 70–99)
Potassium: 4.1 mmol/L (ref 3.5–5.1)
Sodium: 137 mmol/L (ref 135–145)
Total Bilirubin: 0.6 mg/dL (ref 0.3–1.2)
Total Protein: 7.6 g/dL (ref 6.5–8.1)

## 2022-07-06 LAB — CBC WITH DIFFERENTIAL/PLATELET
Abs Immature Granulocytes: 0.06 10*3/uL (ref 0.00–0.07)
Basophils Absolute: 0.1 10*3/uL (ref 0.0–0.1)
Basophils Relative: 1 %
Eosinophils Absolute: 0.2 10*3/uL (ref 0.0–0.5)
Eosinophils Relative: 3 %
HCT: 46.9 % — ABNORMAL HIGH (ref 36.0–46.0)
Hemoglobin: 15.4 g/dL — ABNORMAL HIGH (ref 12.0–15.0)
Immature Granulocytes: 1 %
Lymphocytes Relative: 25 %
Lymphs Abs: 2.2 10*3/uL (ref 0.7–4.0)
MCH: 29.1 pg (ref 26.0–34.0)
MCHC: 32.8 g/dL (ref 30.0–36.0)
MCV: 88.7 fL (ref 80.0–100.0)
Monocytes Absolute: 0.6 10*3/uL (ref 0.1–1.0)
Monocytes Relative: 7 %
Neutro Abs: 5.5 10*3/uL (ref 1.7–7.7)
Neutrophils Relative %: 63 %
Platelets: 303 10*3/uL (ref 150–400)
RBC: 5.29 MIL/uL — ABNORMAL HIGH (ref 3.87–5.11)
RDW: 13.5 % (ref 11.5–15.5)
WBC: 8.6 10*3/uL (ref 4.0–10.5)
nRBC: 0 % (ref 0.0–0.2)

## 2022-07-06 LAB — TROPONIN I (HIGH SENSITIVITY): Troponin I (High Sensitivity): <2 ng/L (ref <18)

## 2022-07-06 LAB — LIPASE, BLOOD: Lipase: 28 U/L (ref 11–51)

## 2022-07-06 MED ORDER — OXYCODONE HCL 5 MG PO TABS: 5.0000 mg | ORAL_TABLET | Freq: Four times a day (QID) | ORAL | 0 refills | Status: AC | PRN

## 2022-07-06 MED ORDER — HYDROMORPHONE HCL 1 MG/ML IJ SOLN
0.5000 mg | Freq: Once | INTRAMUSCULAR | Status: AC
  Administered 2022-07-06: 0.5 mg via INTRAVENOUS
  Filled 2022-07-06: qty 0.5

## 2022-07-06 MED ORDER — IOHEXOL 350 MG/ML SOLN
100.0000 mL | Freq: Once | INTRAVENOUS | Status: AC | PRN
  Administered 2022-07-06: 100 mL via INTRAVENOUS

## 2022-07-06 MED ORDER — ONDANSETRON HCL 4 MG/2ML IJ SOLN
4.0000 mg | Freq: Once | INTRAMUSCULAR | Status: AC
  Administered 2022-07-06: 4 mg via INTRAVENOUS
  Filled 2022-07-06: qty 2

## 2022-07-06 MED ORDER — CEFDINIR 300 MG PO CAPS: 300.0000 mg | ORAL_CAPSULE | Freq: Two times a day (BID) | ORAL | 0 refills | Status: DC

## 2022-07-06 MED ORDER — AZITHROMYCIN 250 MG PO TABS: ORAL_TABLET | ORAL | 0 refills | Status: DC

## 2022-07-06 NOTE — ED Notes (Signed)
Pt ambulated to the room and attached to the monitor; primary RN made aware of the patients arrival.

## 2022-07-06 NOTE — Discharge Instructions (Addendum)
Take Tylenol 1 g every 8 hours use the ibuprofen 600 every 6-8 hours with food and take the oxycodone for breakthrough pain.  Do not drive or work while on this.  Take antibiotics for possible developing pneumonia.  Return to the ER if she develop worsening shortness of breath fevers or any other concerns.  Please follow-up with your primary care doctor for the swelling I have also placed a referral to oncology but seems less likely to be a lymph node at this time  Take oxycodone as prescribed. Do not drink alcohol, drive or participate in any other potentially dangerous activities while taking this medication as it may make you sleepy. Do not take this medication with any other sedating medications, either prescription or over-the-counter. If you were prescribed Percocet or Vicodin, do not take these with acetaminophen (Tylenol) as it is already contained within these medications.  This medication is an opiate (or narcotic) pain medication and can be habit forming. Use it as little as possible to achieve adequate pain control. Do not use or use it with extreme caution if you have a history of opiate abuse or dependence. If you are on a pain contract with your primary care doctor or a pain specialist, be sure to let them know you were prescribed this medication today from the Emergency Department. This medication is intended for your use only - do not give any to anyone else and keep it in a secure place where nobody else, especially children, have access to it.    IMPRESSION: 1. Negative examination for pulmonary embolism. 2. Diffuse bilateral heterogeneous and ground-glass airspace opacity with mosaic attenuation of the airspaces throughout. Diffuse bilateral bronchial wall thickening. Findings are most consistent with multifocal infection or aspiration. 3. Probable underlying component of small airways disease. 4. Tracheobronchomalacia. 5. Pectus deformity. 6. Cardiomegaly.

## 2022-07-06 NOTE — ED Notes (Signed)
Pt back from CT

## 2022-07-06 NOTE — ED Notes (Signed)
Pt to CT

## 2022-07-06 NOTE — ED Provider Notes (Signed)
-----------------------------------------   11:44 PM on 07/06/2022 -----------------------------------------  Assuming care from Dr. Jari Pigg.  In short, Jenna Tyler is a 57 y.o. female with a chief complaint of neck pain.  Refer to the original H&P for additional details.  The current plan of care is to follow-up on labs and reassess.   Clinical Course as of 07/07/22 0227  Sat Jul 07, 2022  0100 Reassuring workup.  No indication of emergent medical condition after Dr. Sheral Flow evaluation.  Patient is not having persistent chest pain.  No indication for repeat troponin at this time.  She is comfortable with the plan for discharge.  She is concerned about pain medication primarily for her neck.  Dr. Jari Pigg prescribed her medications including antibiotics and oxycodone.  The patient has an established relationship with Dr. Raul Del and I encouraged her to follow-up with him at the next available opportunity.  I ordered 2 Norco prior to discharge.  I gave my usual return precautions. [CF]    Clinical Course User Index [CF] Hinda Kehr, MD     Medications  HYDROmorphone (DILAUDID) injection 0.5 mg (0.5 mg Intravenous Given 07/06/22 1955)  ondansetron (ZOFRAN) injection 4 mg (4 mg Intravenous Given 07/06/22 1955)  iohexol (OMNIPAQUE) 350 MG/ML injection 100 mL (100 mLs Intravenous Contrast Given 07/06/22 2034)  HYDROmorphone (DILAUDID) injection 0.5 mg (0.5 mg Intravenous Given 07/06/22 2105)  HYDROcodone-acetaminophen (NORCO/VICODIN) 5-325 MG per tablet 2 tablet (2 tablets Oral Given 07/07/22 0106)     ED Discharge Orders          Ordered    cefdinir (OMNICEF) 300 MG capsule  2 times daily        07/06/22 2335    azithromycin (ZITHROMAX Z-PAK) 250 MG tablet        07/06/22 2335    oxyCODONE (ROXICODONE) 5 MG immediate release tablet  Every 6 hours PRN        07/06/22 2335    Ambulatory referral to Hematology / Oncology        07/06/22 2335           Final diagnoses:   Localized swelling, mass and lump, neck  Pneumonia due to infectious organism, unspecified laterality, unspecified part of lung     Hinda Kehr, MD 07/07/22 [phone]

## 2022-07-06 NOTE — ED Notes (Signed)
Dr. Funke at bedside. 

## 2022-07-06 NOTE — ED Provider Notes (Addendum)
[facility] Provider Note    Event Date/Time   First MD Initiated Contact with Patient 07/06/22 1927     (approximate)   History   Torticollis   HPI  Jenna Tyler is a 57 y.o. female who is otherwise healthy who comes in with concerns for a left-sided neck mass.  Patient reports that it has been there for about 3 weeks.  However she reports some increasing pain noted in it.  She was reportedly seen in urgent care and sent here over here for imaging, blood work.  She denies getting her routine mammograms, colonoscopies.  She does report a smoking history.  She denies any chest pain, shortness of breath or any other concerns.   Physical Exam   Triage Vital Signs: ED Triage Vitals  Enc Vitals Group     BP 07/06/22 1919 (!) 160/98     Pulse Rate 07/06/22 1919 88     Resp 07/06/22 1919 18     Temp 07/06/22 1919 97.8 F (36.6 C)     Temp Source 07/06/22 1919 Oral     SpO2 07/06/22 1919 100 %     Weight 07/06/22 1922 197 lb 12 oz (89.7 kg)     Height 07/06/22 1922 5\' 5"  (1.651 m)     Head Circumference --      Peak Flow --      Pain Score 07/06/22 1920 8     Pain Loc --      Pain Edu? --      Excl. in [place]? --     Most recent vital signs: Vitals:   07/06/22 1919  BP: (!) 160/98  Pulse: 88  Resp: 18  Temp: 97.8 F (36.6 C)  SpO2: 100%     General: Awake, no distress.  CV:  Good peripheral perfusion.  Resp:  Normal effort.  Abd:  No distention.  Soft and nontender Other:  Patient has about a 2 inch fullness noted to the left neck about 2-1/2 6 inches from midline neck.  It is not warm it is not red in nature.  Her oropharynx is clear no teeth abscess noted.   ED Results / Procedures / Treatments   Labs (all labs ordered are listed, but only abnormal results are displayed) Labs Reviewed  CBC WITH DIFFERENTIAL/PLATELET - Abnormal; Notable for the following components:      Result Value   RBC 5.29 (*)    Hemoglobin 15.4 (*)     HCT 46.9 (*)    All other components within normal limits  COMPREHENSIVE METABOLIC PANEL  LIPASE, BLOOD     RADIOLOGY I have reviewed the CT head personally interpreted no evidence of intracranial hemorrhage   PROCEDURES:  Critical Care performed: No  .1-3 Lead EKG Interpretation  Performed by: Vanessa [place], MD Authorized by: Vanessa [place], MD     Interpretation: normal     ECG rate:  70   ECG rate assessment: normal     Rhythm: sinus rhythm     Ectopy: none     Conduction: normal      MEDICATIONS ORDERED IN ED: Medications - No data to display   IMPRESSION / MDM / [place] / ED COURSE  I reviewed the triage vital signs and the nursing notes.   Patient's presentation is most consistent with acute presentation with potential threat to life or bodily function.   She comes in with a left-sided neck fullness.  It could be  consistent with lipoma but she reports that she has been there for 3 weeks and significant pain associated with it.  I do not feel like it looks infected on examination I am more concerned about the potential of a supraclavicular lymph node.  Discussed with her getting pan CT imaging to rule out any source for cancer and she expressed understanding.  Lipase is normal.  CBC shows normal white count.  CMP reassuring  CT head negative CT neck without any swelling IMPRESSION: 1. Negative examination for pulmonary embolism. 2. Diffuse bilateral heterogeneous and ground-glass airspace opacity with mosaic attenuation of the airspaces throughout. Diffuse bilateral bronchial wall thickening. Findings are most consistent with multifocal infection or aspiration. 3. Probable underlying component of small airways disease. 4. Tracheobronchomalacia. 5. Pectus deformity. 6. Cardiomegaly.  IMPRESSION: 1. No acute CT findings of the abdomen or pelvis to explain abdominal pain. 2. Status post cholecystectomy, appendectomy, and hysterectomy.    Incidental findings above were discussed with patient she will follow-up with her primary care doctor for an echocardiogram  CT imaging was reassuring that any evidence of cancer.  She does not have any significant symptoms of multifocal infection but did discuss with patient we will start her on antibiotics in case this could be like a reactive lymph node.  It is interesting that they did not see any swelling noted on the CT scan I did discuss with the radiologist Ulyses Jarred who recommended we place a marker over the area and send her back to the CT scan to see if he can see anything else that looks concerning.  Otherwise we will have her follow-up outpatient with oncology, her PCP  ADDENDUM: I discussed the case with Dr. Jari Pigg. Patient was brought back to CT and a marker was placed at the site of the palpable abnormality. Repeat scan with the marker in place again shows no focal abnormality. Palpable mass could be a poorly encapsulated lipoma, which may be indistinguishable from surrounding fat.    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  She does report a little bit of shortness of breath and coughing so I will get an EKG, cardiac marker.  Will get ambulatory sat suspect discharge home with this is reassuring.  Given a short course of oxycodone to help with pain and we will still have her follow-up with oncology given the swelling just to ensure there is no other workup that would need to be done this could be a lymph node but it is possible it could just be a lipoma.   The patient is on the cardiac monitor to evaluate for evidence of arrhythmia and/or significant heart rate changes.      FINAL CLINICAL IMPRESSION(S) / ED DIAGNOSES   Final diagnoses:  Localized swelling, mass and lump, neck  Pneumonia due to infectious organism, unspecified laterality, unspecified part of lung     Rx / DC Orders   ED  Discharge Orders          Ordered    cefdinir (OMNICEF) 300 MG capsule  2 times daily        07/06/22 2335    azithromycin (ZITHROMAX Z-PAK) 250 MG tablet        07/06/22 2335    oxyCODONE (ROXICODONE) 5 MG immediate release tablet  Every 6 hours PRN        07/06/22 2335    Ambulatory referral to Hematology / Oncology  07/06/22 2335             Note:  This document was prepared using Dragon voice recognition software and may include unintentional dictation errors.   Vanessa [place], MD 07/06/22 2337    Vanessa [place], MD 07/06/22 2352

## 2022-07-06 NOTE — ED Triage Notes (Signed)
Pt presents ambulatory to triage via POV with complaints of pain in the L shoulder that started several weeks ago. Pt has some unilateral swelling to the supraclavicular region of her neck with extreme tenderness. Pt was seen at [facility] and was advised to come here due to the limited testing available. Rates pain 8/10. A&Ox4 at this time. Denies CP or SOB.

## 2022-07-07 LAB — RESP PANEL BY RT-PCR (RSV, FLU A&B, COVID)  RVPGX2
Influenza A by PCR: NEGATIVE
Influenza B by PCR: NEGATIVE
Resp Syncytial Virus by PCR: NEGATIVE
SARS Coronavirus 2 by RT PCR: NEGATIVE

## 2022-07-07 MED ORDER — HYDROCODONE-ACETAMINOPHEN 5-325 MG PO TABS
2.0000 | ORAL_TABLET | Freq: Once | ORAL | Status: AC
  Administered 2022-07-07: 2 via ORAL
  Filled 2022-07-07: qty 2

## 2022-07-07 NOTE — ED Notes (Signed)
Pt ambulated on RA sating @96 -98%, denies SOB, lightheadedness.

## 2022-07-09 ENCOUNTER — Other Ambulatory Visit: Payer: Self-pay | Admitting: *Deleted

## 2022-07-09 NOTE — Telephone Encounter (Signed)
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 She is asking if Dr Tasia Catchings would order her more Oxycodone stating that Tylenol and ibuprofen do not work to control it.She took her last pill this morning. Please advise She was ordered Oxycodone 5 mg 1 every 6 hours as needed # 12  on 07/06/22

## 2022-07-10 MED ORDER — OXYCODONE HCL 10 MG PO TABS: 10.0000 mg | ORAL_TABLET | Freq: Four times a day (QID) | ORAL | 0 refills | Status: DC | PRN

## 2022-07-10 NOTE — Telephone Encounter (Signed)
Patient states that she has to take 2 tabs at a time and not the 1 prescribed because she has a strong tolerance to pain medications Do you want to order Oxy 10 mg 1 every 6 hours as needed or stay with the Oxy 5 mg 1 -2 every 6 hours as needed?

## 2022-07-10 NOTE — Telephone Encounter (Signed)
Call returned to patient and informed that Oxy dose was increased and she is to only take 1 tablet NOT 2 as she was doing previously. Verbalized understanding by repeating back to me

## 2022-07-11 ENCOUNTER — Inpatient Hospital Stay: Payer: Medicaid Other | Attending: Oncology | Admitting: Oncology

## 2022-07-11 ENCOUNTER — Encounter: Payer: Self-pay | Admitting: Oncology

## 2022-07-11 ENCOUNTER — Inpatient Hospital Stay: Payer: Medicaid Other | Admitting: Oncology

## 2022-07-11 ENCOUNTER — Inpatient Hospital Stay: Payer: Medicaid Other

## 2022-07-11 VITALS — BP 128/83 | HR 89 | Temp 97.4°F | Resp 18 | Ht 65.0 in | Wt 192.2 lb

## 2022-07-11 DIAGNOSIS — D751 Secondary polycythemia: Secondary | ICD-10-CM | POA: Insufficient documentation

## 2022-07-11 DIAGNOSIS — M542 Cervicalgia: Secondary | ICD-10-CM

## 2022-07-11 DIAGNOSIS — R221 Localized swelling, mass and lump, neck: Secondary | ICD-10-CM | POA: Diagnosis not present

## 2022-07-11 DIAGNOSIS — I10 Essential (primary) hypertension: Secondary | ICD-10-CM

## 2022-07-11 DIAGNOSIS — R079 Chest pain, unspecified: Secondary | ICD-10-CM

## 2022-07-11 NOTE — Progress Notes (Addendum)
[place]  [phone] [phone]  ID: [name] OB: 1965/10/13  MR#: [id number]  [id number]  Patient Care Team: [facility] as PCP - General  CHIEF COMPLAINT: Left neck fullness  INTERVAL HISTORY: Patient is a 57 year old female who noticed increasing swelling and pain in her left neck approximately 1 month ago.  Upon evaluation in the ED, patient remains symptomatic, but no distinct mass could be seen on imaging.  She otherwise feels well.  She has no neurologic complaints.  She denies any recent fevers or illnesses.  She has a good appetite and denies weight loss.  She has no dysphagia.  She denies any chest pain, shortness of breath, cough, or hemoptysis.  She denies any nausea, vomiting, constipation, or diarrhea.  She has no urinary complaints.  Patient otherwise feels well and offers no further specific complaints today.  REVIEW OF SYSTEMS:   Review of Systems  Constitutional: Negative.  Negative for fever, malaise/fatigue and weight loss.  Respiratory: Negative.  Negative for cough, hemoptysis and shortness of breath.   Cardiovascular: Negative.  Negative for chest pain and leg swelling.  Gastrointestinal: Negative.  Negative for abdominal pain.  Genitourinary: Negative.  Negative for dysuria.  Musculoskeletal:  Positive for neck pain.  Skin: Negative.  Negative for rash.  Neurological: Negative.  Negative for dizziness, focal weakness, weakness and headaches.  Psychiatric/Behavioral: Negative.  The patient is not nervous/anxious.     As per HPI. Otherwise, a complete review of systems is negative.  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   OTHER SURGICAL HISTORY     excision of lipoma   OTHER SURGICAL HISTORY     Abdominal surgery   THYROID SURGERY     thyroidectomy   XI ROBOTIC LAPAROSCOPIC ASSISTED APPENDECTOMY N/A 04/08/2020   Procedure: XI ROBOTIC LAPAROSCOPIC ASSISTED APPENDECTOMY;  Surgeon: Benjamine Sprague, DO;  Location: [facility] ORS;  Service: General;  Laterality: N/A;    FAMILY HISTORY: Family History  Problem Relation Age of Onset   Breast cancer Sister 31   Breast cancer Maternal Aunt 40   Breast cancer Maternal Grandmother 60   Breast cancer Maternal Aunt 40   Breast cancer Cousin 40       maternal side    ADVANCED DIRECTIVES (Y/N):  N  HEALTH MAINTENANCE: Social History   Tobacco Use   Smoking status: Former    Types: Cigarettes    Quit date: 12/28/1997    Years since quitting: 24.5   Smokeless tobacco: Never  Vaping Use   Vaping Use: Never used  Substance Use Topics   Alcohol use: Not Currently    Alcohol/week: 0.0 standard drinks of alcohol   Drug use: No     Colonoscopy:  PAP:  Bone density:  Lipid panel:  Allergies  Allergen Reactions   Nsaids Other (See Comments)    Internal bleeding  Intestinal bleeding   Paxlovid [Nirmatrelvir-Ritonavir]    Tizanidine Other (See Comments)  Visual hallucinations   Adhesive [Tape] Rash   Aspirin Other (See Comments)    Cannot take due to ulcers.   Penicillins Rash    Has patient had a PCN reaction causing immediate rash, facial/tongue/throat swelling, SOB or lightheadedness with hypotension: Yes Has patient had a PCN reaction causing severe rash involving mucus membranes or skin necrosis: No Has patient had a PCN reaction that required hospitalization: No Has patient had a PCN reaction occurring within the last 10 years: Yes If all of  the above answers are "NO", then may proceed with Cephalosporin use.  Patient tolerated ANCEF administration     Current Outpatient Medications  Medication Sig Dispense Refill   albuterol (VENTOLIN HFA) 108 (90 Base) MCG/ACT inhaler Inhale 1-2 puffs into the lungs every 4 (four) hours as needed.     amitriptyline (ELAVIL) 100 MG tablet Take 100 mg by mouth at bedtime.     butalbital-acetaminophen-caffeine (FIORICET) 50-325-40 MG tablet Take 1 tablet by mouth every 4 (four) hours as needed for headache. 14 tablet 0   gabapentin (NEURONTIN) 800 MG tablet Take 800 mg by mouth 4 (four) times daily.     levothyroxine (SYNTHROID) 50 MCG tablet Take 50 mcg by mouth daily.     methocarbamol (ROBAXIN) 750 MG tablet Take 750 mg by mouth 2 (two) times daily.     metoprolol tartrate (LOPRESSOR) 25 MG tablet Take 25 mg by mouth 2 (two) times daily.     Oxycodone HCl 10 MG TABS Take 1 tablet (10 mg total) by mouth every 6 (six) hours as needed. 60 tablet 0   rOPINIRole (REQUIP) 0.5 MG tablet Take 1 mg by mouth 2 (two) times daily.     SYMBICORT 160-4.5 MCG/ACT inhaler Inhale 2 puffs into the lungs 2 (two) times daily.     amLODipine (NORVASC) 5 MG tablet Take 5 mg by mouth 2 (two) times daily. (Patient not taking: Reported on 07/11/2022)     No current facility-administered medications for this visit.    OBJECTIVE: Vitals:   07/11/22 0904  BP: 128/83  Pulse: 89  Resp: 18  Temp: (!) 97.4 F (36.3 C)  SpO2: 100%     Body mass index is 31.98 kg/m.    ECOG FS:1 - Symptomatic but completely ambulatory  General: Well-developed, well-nourished, no acute distress. Eyes: Pink conjunctiva, anicteric sclera. HEENT: Normocephalic, moist mucous membranes.  Mild fullness of the left neck.  No distinctly palpable mass. Lungs: No audible wheezing or coughing. Heart: Regular rate and rhythm. Abdomen: Soft, nontender, no obvious distention. Musculoskeletal: No edema, cyanosis, or clubbing. Neuro: Alert,  answering all questions appropriately. Cranial nerves grossly intact. Skin: No rashes or petechiae noted. Psych: Normal affect. Lymphatics: No cervical, calvicular, axillary or inguinal LAD.   LAB RESULTS:  Lab Results  Component Value Date   NA 137 07/06/2022   K 4.1 07/06/2022   CL 104 07/06/2022   CO2 24 07/06/2022   GLUCOSE 92 07/06/2022   BUN 19 07/06/2022   CREATININE 0.93 07/06/2022   CALCIUM 9.5 07/06/2022   PROT 7.6 07/06/2022   ALBUMIN 4.3 07/06/2022   AST 23 07/06/2022   ALT 22 07/06/2022   ALKPHOS 89 07/06/2022   BILITOT 0.6 07/06/2022   GFRNONAA >60 07/06/2022   GFRAA >60 09/05/2019    Lab Results  Component Value Date   WBC 8.6 07/06/2022   NEUTROABS 5.5 07/06/2022   HGB 15.4 (H) 07/06/2022   HCT 46.9 (H) 07/06/2022   MCV 88.7 07/06/2022   PLT  303 07/06/2022     STUDIES: CT Soft Tissue Neck W Contrast  Addendum Date: 07/06/2022   ADDENDUM REPORT: 07/06/2022 23:19 ADDENDUM: I discussed the case with Dr. Jari Pigg. Patient was brought back to CT and a marker was placed at the site of the palpable abnormality. Repeat scan with the marker in place again shows no focal abnormality. Palpable mass could be a poorly encapsulated lipoma, which may be indistinguishable from surrounding fat. Electronically Signed   By: Ulyses Jarred M.D.   On: 07/06/2022 23:19   Result Date: 07/06/2022 CLINICAL DATA:  Left neck swelling EXAM: CT NECK WITH CONTRAST TECHNIQUE: Multidetector CT imaging of the neck was performed using the standard protocol following the bolus administration of intravenous contrast. RADIATION DOSE REDUCTION: This exam was performed according to the departmental dose-optimization program which includes automated exposure control, adjustment of the mA and/or kV according to patient size and/or use of iterative reconstruction technique. CONTRAST:  135mL OMNIPAQUE IOHEXOL 350 MG/ML SOLN COMPARISON:  None Available. FINDINGS: Pharynx and larynx: Normal. No mass or  swelling. Salivary glands: No inflammation, mass, or stone. Thyroid: Small or absent Lymph nodes: None enlarged or abnormal density. Vascular: Negative. Limited intracranial: Negative. Visualized orbits: Negative. Mastoids and visualized paranasal sinuses: Clear. Skeleton: No acute or aggressive process. Upper chest: Negative. Other: None. IMPRESSION: 1. No acute abnormality of the neck. No finding to correspond to reported left supraclavicular swelling. Electronically Signed: By: Ulyses Jarred M.D. On: 07/06/2022 21:32   CT Angio Chest PE W and/or Wo Contrast  Result Date: 07/06/2022 CLINICAL DATA:  Chest and shoulder pain, PE suspected EXAM: CT ANGIOGRAPHY CHEST WITH CONTRAST TECHNIQUE: Multidetector CT imaging of the chest was performed using the standard protocol during bolus administration of intravenous contrast. Multiplanar CT image reconstructions and MIPs were obtained to evaluate the vascular anatomy. RADIATION DOSE REDUCTION: This exam was performed according to the departmental dose-optimization program which includes automated exposure control, adjustment of the mA and/or kV according to patient size and/or use of iterative reconstruction technique. CONTRAST:  158mL OMNIPAQUE IOHEXOL 350 MG/ML SOLN COMPARISON:  04/09/2022 FINDINGS: Cardiovascular: Satisfactory opacification of the pulmonary arteries to the segmental level. No evidence of pulmonary embolism. Cardiomegaly. No pericardial effusion. Mediastinum/Nodes: No enlarged mediastinal, hilar, or axillary lymph nodes. Tracheobronchomalacia. Thyroid gland and esophagus demonstrate no significant findings. Lungs/Pleura: Diffuse bilateral heterogeneous and ground-glass airspace opacity with mosaic attenuation of the airspaces throughout. Diffuse bilateral bronchial wall thickening. No pleural effusion or pneumothorax. Upper Abdomen: No acute abnormality. Musculoskeletal: Pectus deformity.  No acute osseous findings. Review of the MIP images confirms  the above findings. IMPRESSION: 1. Negative examination for pulmonary embolism. 2. Diffuse bilateral heterogeneous and ground-glass airspace opacity with mosaic attenuation of the airspaces throughout. Diffuse bilateral bronchial wall thickening. Findings are most consistent with multifocal infection or aspiration. 3. Probable underlying component of small airways disease. 4. Tracheobronchomalacia. 5. Pectus deformity. 6. Cardiomegaly. Electronically Signed   By: Delanna Ahmadi M.D.   On: 07/06/2022 21:22   CT ABDOMEN PELVIS W CONTRAST  Result Date: 07/06/2022 CLINICAL DATA:  Abdominal pain EXAM: CT ABDOMEN AND PELVIS WITH CONTRAST TECHNIQUE: Multidetector CT imaging of the abdomen and pelvis was performed using the standard protocol following bolus administration of intravenous contrast. RADIATION DOSE REDUCTION: This exam was performed according to the departmental dose-optimization program which includes automated exposure control, adjustment of the mA and/or kV according to patient size and/or use of iterative reconstruction technique. CONTRAST:  148mL OMNIPAQUE IOHEXOL 350 MG/ML SOLN COMPARISON:  06/01/2021 FINDINGS:  Lower chest: Please see separately reported examination of the chest. Hepatobiliary: No focal liver abnormality is seen. Status post cholecystectomy. Unchanged mild postoperative intrahepatic biliary ductal dilatation. Pancreas: Unremarkable. No pancreatic ductal dilatation or surrounding inflammatory changes. Spleen: Normal in size without significant abnormality. Adrenals/Urinary Tract: Adrenal glands are unremarkable. Kidneys are normal, without renal calculi, solid lesion, or hydronephrosis. Bladder is unremarkable. Stomach/Bowel: Stomach is within normal limits. Status post appendectomy. No evidence of bowel wall thickening, distention, or inflammatory changes. Vascular/Lymphatic: No significant vascular findings are present. No enlarged abdominal or pelvic lymph nodes. Reproductive: Status  post hysterectomy. Other: No abdominal wall hernia or abnormality. No ascites. Musculoskeletal: No acute or significant osseous findings. IMPRESSION: 1. No acute CT findings of the abdomen or pelvis to explain abdominal pain. 2. Status post cholecystectomy, appendectomy, and hysterectomy. Electronically Signed   By: Delanna Ahmadi M.D.   On: 07/06/2022 21:10   CT HEAD WO CONTRAST (5MM)  Result Date: 07/06/2022 CLINICAL DATA:  Left shoulder pain, supraclavicular swelling, headache EXAM: CT HEAD WITHOUT CONTRAST TECHNIQUE: Contiguous axial images were obtained from the base of the skull through the vertex without intravenous contrast. RADIATION DOSE REDUCTION: This exam was performed according to the departmental dose-optimization program which includes automated exposure control, adjustment of the mA and/or kV according to patient size and/or use of iterative reconstruction technique. COMPARISON:  08/29/2021 FINDINGS: Brain: No acute infarct or hemorrhage. Lateral ventricles and midline structures are unremarkable. No acute extra-axial fluid collections. No mass effect. Vascular: No hyperdense vessel or unexpected calcification. Skull: Normal. Negative for fracture or focal lesion. Sinuses/Orbits: No acute finding. Other: None. IMPRESSION: 1. Stable head CT, no acute intracranial process. Electronically Signed   By: Randa Ngo M.D.   On: 07/06/2022 20:55    ASSESSMENT: Left neck fullness.  PLAN:    Left neck fullness: CT scan results from July 06, 2022 reviewed independently and report as above with no distinct mass seen on CT of the neck, but patient does have a palpable fullness and is symptomatic with pain.  Will get a PET scan to further evaluate.  Patient was also given a referral to ENT.  No follow-up has been scheduled at this time.  Will arrange follow-up based on results of imaging and ENT evaluation if malignancy is a possibility. Reviewed that PET scan is needed to help guide possible biopsy  navigation with ENT.  Pain: Patient was given a prescription for oxycodone. Polycythemia: Mild.  Previous workup was negative.  I spent a total of 60 minutes reviewing chart data, face-to-face evaluation with the patient, counseling and coordination of care as detailed above.  Patient expressed understanding and was in agreement with this plan. She also understands that She can call clinic at any time with any questions, concerns, or complaints.    Cancer Staging  No matching staging information was found for the patient.  Lloyd Huger, MD   07/11/2022 9:35 AM

## 2022-07-12 ENCOUNTER — Other Ambulatory Visit: Payer: Self-pay | Admitting: *Deleted

## 2022-07-12 ENCOUNTER — Encounter: Payer: Self-pay | Admitting: Oncology

## 2022-07-12 MED ORDER — OXYCODONE HCL 10 MG PO TABS: 20.0000 mg | ORAL_TABLET | Freq: Four times a day (QID) | ORAL | 0 refills | Status: DC | PRN

## 2022-07-13 ENCOUNTER — Encounter: Payer: Self-pay | Admitting: *Deleted

## 2022-07-13 NOTE — Patient Instructions (Signed)
PA submitted for oxycodone, status pending at this time.

## 2022-07-16 ENCOUNTER — Telehealth: Payer: Self-pay | Admitting: *Deleted

## 2022-07-16 ENCOUNTER — Encounter: Payer: Self-pay | Admitting: Oncology

## 2022-07-16 NOTE — Telephone Encounter (Signed)
Patient called  to report that she has just been notified that her PET scan was cancelled. She does not know why. Please call her with what you find is the cause.

## 2022-07-17 ENCOUNTER — Other Ambulatory Visit: Payer: Self-pay | Admitting: *Deleted

## 2022-07-17 NOTE — Telephone Encounter (Signed)
Patient called asking for a refill of her Oxycodone 10 mg which was filled 3/21 with directions to take 2 every 6 hours as needed and #40 disp. Which would be a 5 day supply, so it would be due for a refill today. Do you want to change her prescription to 20 mg tabs and give more than 5 day supply or what to do? She is also asking why her PET Scan was cancelled, I see that it says no auth from insurance was obtained. Patient has no further follow up appointment scheduled, Please advise

## 2022-07-18 ENCOUNTER — Ambulatory Visit: Payer: Medicaid Other

## 2022-07-18 MED ORDER — OXYCODONE HCL 10 MG PO TABS: 20.0000 mg | ORAL_TABLET | Freq: Four times a day (QID) | ORAL | 0 refills | Status: DC | PRN

## 2022-07-20 ENCOUNTER — Other Ambulatory Visit: Payer: Self-pay | Admitting: Internal Medicine

## 2022-07-20 DIAGNOSIS — R42 Dizziness and giddiness: Secondary | ICD-10-CM

## 2022-07-20 DIAGNOSIS — R079 Chest pain, unspecified: Secondary | ICD-10-CM

## 2022-07-20 DIAGNOSIS — I10 Essential (primary) hypertension: Secondary | ICD-10-CM

## 2022-07-20 DIAGNOSIS — R0602 Shortness of breath: Secondary | ICD-10-CM

## 2022-07-22 ENCOUNTER — Encounter: Payer: Self-pay | Admitting: Oncology

## 2022-07-23 ENCOUNTER — Other Ambulatory Visit: Payer: Self-pay | Admitting: *Deleted

## 2022-07-23 MED ORDER — OXYCODONE HCL 10 MG PO TABS: 20.0000 mg | ORAL_TABLET | Freq: Four times a day (QID) | ORAL | 0 refills | Status: DC | PRN

## 2022-07-24 ENCOUNTER — Ambulatory Visit
Admission: RE | Admit: 2022-07-24 | Discharge: 2022-07-24 | Disposition: A | Discharge status: Home / Self Care | Payer: Medicaid Other | Source: Ambulatory Visit | Attending: Oncology | Admitting: Oncology

## 2022-07-24 DIAGNOSIS — R221 Localized swelling, mass and lump, neck: Secondary | ICD-10-CM | POA: Insufficient documentation

## 2022-07-24 LAB — GLUCOSE, CAPILLARY: Glucose-Capillary: 140 mg/dL — ABNORMAL HIGH (ref 70–99)

## 2022-07-24 MED ORDER — FLUDEOXYGLUCOSE F - 18 (FDG) INJECTION
10.2600 | Freq: Once | INTRAVENOUS | Status: AC | PRN
  Administered 2022-07-24: 10.26 via INTRAVENOUS

## 2022-07-25 ENCOUNTER — Encounter: Payer: Self-pay | Admitting: Oncology

## 2022-07-25 ENCOUNTER — Telehealth: Payer: Self-pay

## 2022-07-25 NOTE — Telephone Encounter (Addendum)
Results sent through mychart.  ----- Message from Lloyd Huger, MD sent at 07/25/2022  8:42 AM EDT ----- Negative PET.  Please refer pt to ENT. No f/u needed.  ----- Message ----- From: Interface, Rad Results In Sent: 07/25/2022   8:39 AM EDT To: Lloyd Huger, MD

## 2022-08-10 ENCOUNTER — Telehealth (HOSPITAL_COMMUNITY): Payer: Self-pay | Admitting: Emergency Medicine

## 2022-08-10 ENCOUNTER — Encounter (HOSPITAL_COMMUNITY): Payer: Self-pay

## 2022-08-10 DIAGNOSIS — R079 Chest pain, unspecified: Secondary | ICD-10-CM

## 2022-08-10 MED ORDER — IVABRADINE HCL 5 MG PO TABS: 10.0000 mg | ORAL_TABLET | Freq: Once | ORAL | 0 refills | Status: AC

## 2022-08-10 MED ORDER — METOPROLOL TARTRATE 100 MG PO TABS: 100.0000 mg | ORAL_TABLET | Freq: Once | ORAL | 0 refills | Status: DC

## 2022-08-10 NOTE — Telephone Encounter (Signed)
Attempted to call patient regarding upcoming cardiac CT appointment. Left message on voicemail with name and callback number Rockwell [place] RN Navigator Cardiac Imaging [facility] [phone] Office [phone] Cell  Sent HR control meds to pharm on file  metoprolol tartrate  ivabradine

## 2022-08-13 ENCOUNTER — Ambulatory Visit: Admission: RE | Admit: 2022-08-13 | Payer: Medicaid Other | Source: Ambulatory Visit

## 2022-08-13 ENCOUNTER — Other Ambulatory Visit: Payer: Self-pay

## 2022-08-16 ENCOUNTER — Encounter (HOSPITAL_COMMUNITY): Payer: Self-pay

## 2022-08-16 ENCOUNTER — Telehealth (HOSPITAL_COMMUNITY): Payer: Self-pay | Admitting: *Deleted

## 2022-08-16 ENCOUNTER — Other Ambulatory Visit (HOSPITAL_COMMUNITY): Payer: Self-pay | Admitting: *Deleted

## 2022-08-16 DIAGNOSIS — R079 Chest pain, unspecified: Secondary | ICD-10-CM

## 2022-08-16 MED ORDER — IVABRADINE HCL 5 MG PO TABS: ORAL_TABLET | ORAL | 0 refills | Status: DC

## 2022-08-16 MED ORDER — METOPROLOL TARTRATE 100 MG PO TABS
100.0000 mg | ORAL_TABLET | Freq: Once | ORAL | 0 refills | Status: DC
Start: 2022-08-16 — End: 2022-09-19

## 2022-08-16 NOTE — Telephone Encounter (Signed)
Patient calling about her upcoming cardiac imaging study; pt verbalizes understanding of appt date/time, parking situation and where to check in, pre-test NPO status and medications ordered, and verified current allergies; name and call back number provided for further questions should they arise  Jenna Brick RN Navigator Cardiac Imaging [facility] [phone] office [phone] cell  Patient to take  metoprolol tartrate and  ivabradine two hours prior to her cardiac CT scan. She is taking medicare transport but knows to arrive at 8am.

## 2022-08-20 ENCOUNTER — Encounter (HOSPITAL_COMMUNITY): Payer: Self-pay

## 2022-08-20 ENCOUNTER — Ambulatory Visit (HOSPITAL_COMMUNITY): Admission: RE | Admit: 2022-08-20 | Payer: Medicaid Other | Source: Ambulatory Visit

## 2022-09-04 ENCOUNTER — Ambulatory Visit: Payer: Medicaid Other | Admitting: Internal Medicine

## 2022-09-05 ENCOUNTER — Telehealth (HOSPITAL_COMMUNITY): Payer: Self-pay | Admitting: Emergency Medicine

## 2022-09-05 NOTE — Telephone Encounter (Signed)
Reaching out to patient to offer assistance regarding upcoming cardiac imaging study; pt verbalizes understanding of appt date/time, parking situation and where to check in, pre-test NPO status and medications ordered, and verified current allergies; name and call back number provided for further questions should they arise Kiyon Fidalgo RN Navigator Cardiac Imaging [facility] [phone] office [phone] cell 

## 2022-09-06 ENCOUNTER — Ambulatory Visit (HOSPITAL_COMMUNITY)
Admission: RE | Admit: 2022-09-06 | Discharge: 2022-09-06 | Disposition: A | Discharge status: Home / Self Care | Payer: Medicaid Other | Source: Ambulatory Visit | Attending: Internal Medicine | Admitting: Internal Medicine

## 2022-09-06 DIAGNOSIS — I10 Essential (primary) hypertension: Secondary | ICD-10-CM | POA: Diagnosis not present

## 2022-09-06 DIAGNOSIS — R072 Precordial pain: Secondary | ICD-10-CM | POA: Diagnosis not present

## 2022-09-06 DIAGNOSIS — R0602 Shortness of breath: Secondary | ICD-10-CM | POA: Diagnosis present

## 2022-09-06 DIAGNOSIS — R079 Chest pain, unspecified: Secondary | ICD-10-CM

## 2022-09-06 DIAGNOSIS — R42 Dizziness and giddiness: Secondary | ICD-10-CM

## 2022-09-06 MED ORDER — NITROGLYCERIN 0.4 MG SL SUBL
SUBLINGUAL_TABLET | SUBLINGUAL | Status: AC
  Filled 2022-09-06: qty 2

## 2022-09-06 MED ORDER — NITROGLYCERIN 0.4 MG SL SUBL
0.8000 mg | SUBLINGUAL_TABLET | Freq: Once | SUBLINGUAL | Status: AC
  Administered 2022-09-06: 0.8 mg via SUBLINGUAL

## 2022-09-06 MED ORDER — IOHEXOL 350 MG/ML SOLN
95.0000 mL | Freq: Once | INTRAVENOUS | Status: AC | PRN
  Administered 2022-09-06: 95 mL via INTRAVENOUS

## 2022-09-10 ENCOUNTER — Other Ambulatory Visit: Payer: Medicaid Other

## 2022-09-11 ENCOUNTER — Ambulatory Visit: Payer: Medicaid Other | Admitting: Internal Medicine

## 2022-09-11 ENCOUNTER — Encounter: Payer: Self-pay | Admitting: Internal Medicine

## 2022-09-11 VITALS — BP 118/80 | HR 85 | Temp 97.7°F | Resp 16 | Ht 65.0 in | Wt 197.8 lb

## 2022-09-11 DIAGNOSIS — J4520 Mild intermittent asthma, uncomplicated: Secondary | ICD-10-CM | POA: Diagnosis not present

## 2022-09-11 DIAGNOSIS — K219 Gastro-esophageal reflux disease without esophagitis: Secondary | ICD-10-CM | POA: Diagnosis not present

## 2022-09-11 DIAGNOSIS — J398 Other specified diseases of upper respiratory tract: Secondary | ICD-10-CM | POA: Diagnosis not present

## 2022-09-11 DIAGNOSIS — R053 Chronic cough: Secondary | ICD-10-CM | POA: Diagnosis not present

## 2022-09-11 NOTE — Progress Notes (Signed)
[facility] [address] [place], [place] [address]  Pulmonary Sleep Medicine   Office Visit Note  Patient Name: Jenna Tyler DOB: August 13, 1965 MRN [id number]  Date of Service: 09/11/2022  Complaints/HPI: She states she has had a cough for many years. She notes that she has a prior history of tracheobronchomalacia. She states she had a CT and PET chest done for something else and she was told she had the disorder. She states she has gone to Dr Meredeth Ide in the past. She has had flattening of her flow volume loop during her PFTs. She states currently her cough is dry and is not bringing up any blood. She does get frequent infections. She states no relation to stress. She has had a diagnosis of reflux. She denies current heartburn symptoms. She is a former smoker. Quit in 2000 and is not around any smokers. 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 She states due to her thought process and leg weakness. She also has a history of fibromyalgia. She states she has a history of GBS too  Office Spirometry Results:     ROS  General: (-) fever, (-) chills, (-) night sweats, (-) weakness Skin: (-) rashes, (-) itching,. Eyes: (-) visual changes, (-) redness, (-) itching. Nose and Sinuses: (-) nasal stuffiness or itchiness, (-) postnasal drip, (-) nosebleeds, (-) sinus trouble. Mouth and Throat: (-) sore throat, (-) hoarseness. Neck: (-) swollen glands, (-) enlarged thyroid, (-) neck pain. Respiratory: + cough, (-) bloody sputum, - shortness of breath, - wheezing. Cardiovascular: - ankle swelling, (-) chest pain. Lymphatic: (-) lymph node enlargement. Neurologic: (-) numbness, (-) tingling. Psychiatric: (-) anxiety, (-) depression   Current Medication: Outpatient Encounter Medications as of 09/11/2022  Medication Sig Note   albuterol (VENTOLIN HFA) 108 (90 Base) MCG/ACT inhaler Inhale 1-2 puffs into the lungs every 4 (four) hours as needed.     amitriptyline (ELAVIL) 100 MG tablet Take 100 mg by mouth at bedtime.    amLODipine (NORVASC) 5 MG tablet Take 5 mg by mouth 2 (two) times daily.    butalbital-acetaminophen-caffeine (FIORICET) 50-325-40 MG tablet Take 1 tablet by mouth every 4 (four) hours as needed for headache.    gabapentin (NEURONTIN) 800 MG tablet Take 800 mg by mouth 4 (four) times daily. 10/04/2021: Patient takes 800 mg twice daily   ivabradine (CORLANOR) 5 MG TABS tablet Take tablets (10mg ) TWO hours prior to your cardiac CT scan.    levothyroxine (SYNTHROID) 50 MCG tablet Take 50 mcg by mouth daily.    methocarbamol (ROBAXIN) 750 MG tablet Take 750 mg by mouth 2 (two) times daily.    metoprolol tartrate (LOPRESSOR) 25 MG tablet Take 25 mg by mouth 2 (two) times daily.    Oxycodone HCl 10 MG TABS Take 2 tablets (20 mg total) by mouth every 6 (six) hours as needed.    rOPINIRole (REQUIP) 0.5 MG tablet Take 1 mg by mouth 2 (two) times daily.    SYMBICORT 160-4.5 MCG/ACT inhaler Inhale 2 puffs into the lungs 2 (two) times daily.    metoprolol tartrate (LOPRESSOR) 100 MG tablet Take 1 tablet (100 mg total) by mouth once for 1 dose. Please take one time dose 100mg  metoprolol tartrate 2 hr prior to cardiac CT for HR control IF HR >55bpm.    No facility-administered encounter medications on file as of 09/11/2022.    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   OTHER SURGICAL HISTORY     excision of lipoma   OTHER SURGICAL HISTORY     Abdominal surgery   THYROID SURGERY     thyroidectomy   XI ROBOTIC LAPAROSCOPIC ASSISTED APPENDECTOMY N/A 04/08/2020   Procedure: XI ROBOTIC LAPAROSCOPIC ASSISTED APPENDECTOMY;  Surgeon: Sung Amabile, DO;  Location: [facility] ORS;  Service: General;  Laterality: N/A;    Medical History: Past Medical History:  Diagnosis Date   Asthma    COPD  (chronic obstructive pulmonary disease) (HCC)    Dyspnea    Guillain Barr syndrome (HCC)    Hyperlipidemia    Hypertension    Hypothyroidism    Neurofibromatosis, peripheral, NF1 (HCC) 2017   Pancreatitis     Family History: Family History  Problem Relation Age of Onset   Breast cancer Sister 35   Breast cancer Maternal Aunt 40   Breast cancer Maternal Grandmother 60   Breast cancer Maternal Aunt 40   Breast cancer Cousin 40       maternal side    Social History: Social History   Socioeconomic History   Marital status: Divorced    Spouse name: Not on file   Number of children: Not on file   Years of education: Not on file   Highest education level: Not on file  Occupational History   Not on file  Tobacco Use   Smoking status: Former    Types: Cigarettes    Quit date: 12/28/1997    Years since quitting: 24.7   Smokeless tobacco: Never  Vaping Use   Vaping Use: Never used  Substance and Sexual Activity   Alcohol use: Not Currently    Alcohol/week: 0.0 standard drinks of alcohol   Drug use: No   Sexual activity: Not Currently    Birth control/protection: Surgical  Other Topics Concern   Not on file  Social History Narrative   Not on file   Social Determinants of Health   Financial Resource Strain: High Risk (09/16/2017)   Overall Financial Resource Strain (CARDIA)    Difficulty of Paying Living Expenses: Very hard  Food Insecurity: No Food Insecurity (04/10/2022)   Hunger Vital Sign    Worried About Running Out of Food in the Last Year: Never true    Ran Out of Food in the Last Year: Never true  Transportation Needs: No Transportation Needs (04/10/2022)   PRAPARE - Administrator, Civil Service (Medical): No    Lack of Transportation (Non-Medical): No  Physical Activity: Insufficiently Active (09/16/2017)   Exercise Vital Sign    Days of Exercise per Week: 1 day    Minutes of Exercise per Session: 10 min  Stress: Stress Concern Present  (09/16/2017)   [employer] of Occupational Health - Occupational Stress Questionnaire    Feeling of Stress : Rather much  Social Connections: Unknown (09/16/2017)   Social Connection and Isolation Panel [NHANES]    Frequency of Communication with Friends and Family: Patient declined    Frequency of Social Gatherings with Friends and Family: Patient declined    Attends Religious Services: Patient declined    Active Member of Clubs or Organizations: Patient declined    Attends Banker Meetings: Patient declined    Marital Status: Patient declined  Intimate Partner Violence: Not At Risk (04/10/2022)   Humiliation, Afraid, Rape, and Kick questionnaire    Fear of Current  or Ex-Partner: No    Emotionally Abused: No    Physically Abused: No    Sexually Abused: No    Vital Signs: Blood pressure 118/80, pulse 85, temperature 97.7 F (36.5 C), resp. rate 16, height 5\' 5"  (1.651 m), weight 197 lb 12.8 oz (89.7 kg), SpO2 97 %.  Examination: General Appearance: The patient is well-developed, well-nourished, and in no distress. Skin: Gross inspection of skin unremarkable. Head: normocephalic, no gross deformities. Eyes: no gross deformities noted. ENT: ears appear grossly normal no exudates. Neck: Supple. No thyromegaly. No LAD. Respiratory: no rhonchi noted. Cardiovascular: Normal S1 and S2 without murmur or rub. Extremities: No cyanosis. pulses are equal. Neurologic: Alert and oriented. No involuntary movements.  LABS: Recent Results (from the past 2160 hour(s))  CBC with Differential     Status: Abnormal   Collection Time: 07/06/22  7:42 PM  Result Value Ref Range   WBC 8.6 4.0 - 10.5 K/uL   RBC 5.29 (H) 3.87 - 5.11 MIL/uL   Hemoglobin 15.4 (H) 12.0 - 15.0 g/dL   HCT 16.1 (H) 09.6 - 04.5 %   MCV 88.7 80.0 - 100.0 fL   MCH 29.1 26.0 - 34.0 pg   MCHC 32.8 30.0 - 36.0 g/dL   RDW 40.9 81.1 - 91.4 %   Platelets 303 150 - 400 K/uL   nRBC 0.0 0.0 - 0.2 %   Neutrophils  Relative % 63 %   Neutro Abs 5.5 1.7 - 7.7 K/uL   Lymphocytes Relative 25 %   Lymphs Abs 2.2 0.7 - 4.0 K/uL   Monocytes Relative 7 %   Monocytes Absolute 0.6 0.1 - 1.0 K/uL   Eosinophils Relative 3 %   Eosinophils Absolute 0.2 0.0 - 0.5 K/uL   Basophils Relative 1 %   Basophils Absolute 0.1 0.0 - 0.1 K/uL   Immature Granulocytes 1 %   Abs Immature Granulocytes 0.06 0.00 - 0.07 K/uL    Comment: Performed at [facility], [address] [place], [place] [address]  Comprehensive metabolic panel     Status: None   Collection Time: 07/06/22  7:42 PM  Result Value Ref Range   Sodium 137 135 - 145 mmol/L   Potassium 4.1 3.5 - 5.1 mmol/L   Chloride 104 98 - 111 mmol/L   CO2 24 22 - 32 mmol/L   Glucose, Bld 92 70 - 99 mg/dL    Comment: Glucose reference range applies only to samples taken after fasting for at least 8 hours.   BUN 19 6 - 20 mg/dL   Creatinine, Ser 6.21 0.44 - 1.00 mg/dL   Calcium 9.5 8.9 - 30.8 mg/dL   Total Protein 7.6 6.5 - 8.1 g/dL   Albumin 4.3 3.5 - 5.0 g/dL   AST 23 15 - 41 U/L   ALT 22 0 - 44 U/L   Alkaline Phosphatase 89 38 - 126 U/L   Total Bilirubin 0.6 0.3 - 1.2 mg/dL   GFR, Estimated >65 >78 mL/min    Comment: (NOTE) Calculated using the CKD-EPI Creatinine Equation (2021)    Anion gap 9 5 - 15    Comment: Performed at [facility], [address] [place], [place] [address]  Lipase, blood     Status: None   Collection Time: 07/06/22  7:42 PM  Result Value Ref Range   Lipase 28 11 - 51 U/L    Comment: Performed at [facility], [address] [place], [place] [address]  Troponin I (High Sensitivity)  Status: None   Collection Time: 07/06/22  7:42 PM  Result Value Ref Range   Troponin I (High Sensitivity) <2 <18 ng/L    Comment: (NOTE) Elevated high sensitivity troponin I (hsTnI) values and significant  changes across serial measurements may suggest ACS but many other  chronic and acute conditions are known to  elevate hsTnI results.  Refer to the "Links" section for chest pain algorithms and additional  guidance. Performed at [facility], [address] [place], [place] [address]   Resp panel by RT-PCR (RSV, Flu A&B, Covid) Anterior Nasal Swab     Status: None   Collection Time: 07/06/22 11:42 PM   Specimen: Anterior Nasal Swab  Result Value Ref Range   SARS Coronavirus 2 by RT PCR NEGATIVE NEGATIVE    Comment: (NOTE) SARS-CoV-2 target nucleic acids are NOT DETECTED.  The SARS-CoV-2 RNA is generally detectable in upper respiratory specimens during the acute phase of infection. The lowest concentration of SARS-CoV-2 viral copies this assay can detect is 138 copies/mL. A negative result does not preclude SARS-Cov-2 infection and should not be used as the sole basis for treatment or other patient management decisions. A negative result may occur with  improper specimen collection/handling, submission of specimen other than nasopharyngeal swab, presence of viral mutation(s) within the areas targeted by this assay, and inadequate number of viral copies(<138 copies/mL). A negative result must be combined with clinical observations, patient history, and epidemiological information. The expected result is Negative.  Fact Sheet for Patients:  BloggerCourse.com  Fact Sheet for Healthcare Providers:  SeriousBroker.it  This test is no t yet approved or cleared by the [place] FDA and  has been authorized for detection and/or diagnosis of SARS-CoV-2 by FDA under an Emergency Use Authorization (EUA). This EUA will remain  in effect (meaning this test can be used) for the duration of the COVID-19 declaration under Section 564(b)(1) of the Act, 21 U.S.C.section 360bbb-3(b)(1), unless the authorization is terminated  or revoked sooner.       Influenza A by PCR NEGATIVE NEGATIVE   Influenza B by PCR NEGATIVE NEGATIVE    Comment:  (NOTE) The Xpert Xpress SARS-CoV-2/FLU/RSV plus assay is intended as an aid in the diagnosis of influenza from Nasopharyngeal swab specimens and should not be used as a sole basis for treatment. Nasal washings and aspirates are unacceptable for Xpert Xpress SARS-CoV-2/FLU/RSV testing.  Fact Sheet for Patients: BloggerCourse.com  Fact Sheet for Healthcare Providers: SeriousBroker.it  This test is not yet approved or cleared by the [place] FDA and has been authorized for detection and/or diagnosis of SARS-CoV-2 by FDA under an Emergency Use Authorization (EUA). This EUA will remain in effect (meaning this test can be used) for the duration of the COVID-19 declaration under Section 564(b)(1) of the Act, 21 U.S.C. section 360bbb-3(b)(1), unless the authorization is terminated or revoked.     Resp Syncytial Virus by PCR NEGATIVE NEGATIVE    Comment: (NOTE) Fact Sheet for Patients: BloggerCourse.com  Fact Sheet for Healthcare Providers: SeriousBroker.it  This test is not yet approved or cleared by the [place] FDA and has been authorized for detection and/or diagnosis of SARS-CoV-2 by FDA under an Emergency Use Authorization (EUA). This EUA will remain in effect (meaning this test can be used) for the duration of the COVID-19 declaration under Section 564(b)(1) of the Act, 21 U.S.C. section 360bbb-3(b)(1), unless the authorization is terminated or revoked.  Performed at [facility], [address], [place], [place] [address]  Glucose, capillary     Status: Abnormal   Collection Time: 07/24/22 10:33 AM  Result Value Ref Range   Glucose-Capillary 140 (H) 70 - 99 mg/dL    Comment: Glucose reference range applies only to samples taken after fasting for at least 8 hours.    Radiology: CT CORONARY MORPH W/CTA COR W/SCORE W/CA W/CM &/OR WO/CM  Addendum Date:  09/10/2022   ADDENDUM REPORT: 09/10/2022 20:36 CLINICAL DATA:  This over-read does not include interpretation of cardiac or coronary anatomy or pathology. The coronary CTA interpretation by the cardiologist is attached. COMPARISON:  None available. FINDINGS: No suspicious nodules, masses, or infiltrates are identified in the visualized portion of the lungs. No pleural fluid seen. The visualized portions of the mediastinum and hilar regions are unremarkable. IMPRESSION: No significant non-cardiac abnormality identified. Electronically Signed   By: Danae Orleans M.D.   On: 09/10/2022 20:36   Result Date: 09/10/2022 CLINICAL DATA:  This is a 57 year old female with anginal symptoms. EXAM: Cardiac/Coronary  CTA TECHNIQUE: The patient was scanned on a [employer]. FINDINGS: A 100 kV prospective scan was triggered in the descending thoracic aorta at 111 HU's. Axial non-contrast 3 mm slices were carried out through the heart. The data set was analyzed on a dedicated work station and scored using the Agatson method. Gantry rotation speed was 250 msecs and collimation was .6 mm. No beta blockade and 0.8 mg of sl NTG was given. The 3D data set was reconstructed in 5% intervals of the 67-82 % of the R-R cycle. Diastolic phases were analyzed on a dedicated work station using MPR, MIP and VRT modes. The patient received 80 cc of contrast. Aorta: Normal size.  No calcifications.  No dissection. Aortic Valve:  Trileaflet.  No calcifications. Coronary Arteries:  Normal coronary origin.  Right dominance. RCA is a large dominant artery that gives rise to PDA and PLA. There is no plaque. Left main is a large artery that gives rise to LAD and LCX arteries. LAD is a large vessel that has no plaque. LCX is a non-dominant artery that gives rise to one large OM1 branch. There is no plaque. Coronary Calcium Score: Left main: 0 Left anterior descending artery: 0 Left circumflex artery: 0 Right coronary artery: 0 Total: 0  Percentile: 0 Other findings: Normal pulmonary vein drainage into the left atrium. Normal left atrial appendage without a thrombus. Normal size of the pulmonary artery. IMPRESSION: 1. Coronary calcium score of 0. This was 0 percentile for age and sex matched control. 2. Normal coronary origin with right dominance. 3. CAD-RADS 0. No evidence of CAD (0%). Consider non-atherosclerotic causes of chest pain. The noncardiac portion of this study will be interpreted in separate report by the radiologist. Electronically Signed: By: Thomasene Ripple D.O. On: 09/06/2022 14:45    No results found.  CT CORONARY MORPH W/CTA COR W/SCORE W/CA W/CM &/OR WO/CM  Addendum Date: 09/10/2022   ADDENDUM REPORT: 09/10/2022 20:36 CLINICAL DATA:  This over-read does not include interpretation of cardiac or coronary anatomy or pathology. The coronary CTA interpretation by the cardiologist is attached. COMPARISON:  None available. FINDINGS: No suspicious nodules, masses, or infiltrates are identified in the visualized portion of the lungs. No pleural fluid seen. The visualized portions of the mediastinum and hilar regions are unremarkable. IMPRESSION: No significant non-cardiac abnormality identified. Electronically Signed   By: Danae Orleans M.D.   On: 09/10/2022 20:36   Result Date: 09/10/2022 CLINICAL DATA:  This is a 57  year old female with anginal symptoms. EXAM: Cardiac/Coronary  CTA TECHNIQUE: The patient was scanned on a [employer]. FINDINGS: A 100 kV prospective scan was triggered in the descending thoracic aorta at 111 HU's. Axial non-contrast 3 mm slices were carried out through the heart. The data set was analyzed on a dedicated work station and scored using the Agatson method. Gantry rotation speed was 250 msecs and collimation was .6 mm. No beta blockade and 0.8 mg of sl NTG was given. The 3D data set was reconstructed in 5% intervals of the 67-82 % of the R-R cycle. Diastolic phases were analyzed on a dedicated  work station using MPR, MIP and VRT modes. The patient received 80 cc of contrast. Aorta: Normal size.  No calcifications.  No dissection. Aortic Valve:  Trileaflet.  No calcifications. Coronary Arteries:  Normal coronary origin.  Right dominance. RCA is a large dominant artery that gives rise to PDA and PLA. There is no plaque. Left main is a large artery that gives rise to LAD and LCX arteries. LAD is a large vessel that has no plaque. LCX is a non-dominant artery that gives rise to one large OM1 branch. There is no plaque. Coronary Calcium Score: Left main: 0 Left anterior descending artery: 0 Left circumflex artery: 0 Right coronary artery: 0 Total: 0 Percentile: 0 Other findings: Normal pulmonary vein drainage into the left atrium. Normal left atrial appendage without a thrombus. Normal size of the pulmonary artery. IMPRESSION: 1. Coronary calcium score of 0. This was 0 percentile for age and sex matched control. 2. Normal coronary origin with right dominance. 3. CAD-RADS 0. No evidence of CAD (0%). Consider non-atherosclerotic causes of chest pain. The noncardiac portion of this study will be interpreted in separate report by the radiologist. Electronically Signed: By: Thomasene Ripple D.O. On: 09/06/2022 14:45    Assessment and Plan: Patient Active Problem List   Diagnosis Date Noted   Viral pneumonia 04/11/2022   Obesity (BMI 30-39.9) 04/11/2022   CAP (community acquired pneumonia) 04/10/2022   Acute respiratory failure with hypoxia (HCC) 07/17/2020   Chest wall pain 07/17/2020   Abdominal pain 04/07/2020   Adaptation reaction 07/14/2019   Allergic rhinitis, seasonal 07/14/2019   Breast lump 07/14/2019   Cephalalgia 07/14/2019   Chronic pain associated with significant psychosocial dysfunction 07/14/2019   Encounter for screening for lipoid disorders 07/14/2019   Feeling bilious 07/14/2019   Feeling stressed out 07/14/2019   Immunization, tetanus toxoid 07/14/2019   Infection of the upper  respiratory tract 07/14/2019   Urethra, 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   Uterine spasm 07/14/2019   Breath shortness 07/14/2019   Breathlessness on exertion 07/14/2019   SOB (shortness of breath)    Severe sepsis (HCC) 07/10/2019   Easy bruising 07/02/2019   Polycythemia 07/02/2019   Tremor 05/29/2019   Cognitive decline 09/29/2018   Acute hypoxic respiratory failure (HCC) 08/28/2018   Seizures (HCC) 01/15/2018   Leg pain 10/18/2017   Lower extremity weakness 10/18/2017   Weakness 09/16/2017   Lower extremity pain, bilateral 09/10/2017   Other symptoms and signs involving the musculoskeletal system 07/26/2017   AKI (acute kidney injury) (HCC) 07/12/2017   HTN (hypertension) 12/29/2016   COPD exacerbation (HCC) 12/29/2016   Substance induced mood disorder (HCC) 12/29/2016   Alcohol use disorder, severe, dependence (HCC) 12/27/2016   Major depressive disorder, 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   Vomiting of fecal matter 08/15/2016   Globus sensation 08/15/2016   Multiple somatic complaints 08/15/2016  Multiple duodenal ulcers 01/24/2016   Prediabetes 07/07/2015   Neurofibromatosis, 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   Acquired hypothyroidism 04/04/2015   Pre-diabetes 04/04/2015   Vitamin D deficiency 04/04/2015   Borderline diabetes mellitus 04/04/2015   Anxiety 10/12/2014   Anxiety, generalized 10/12/2014   H/O: hypothyroidism 10/12/2014   Insomnia, persistent 10/12/2014   Cannot sleep 10/12/2014   Depression, major, recurrent, moderate (HCC) 10/12/2014   Drug abuse, opioid type (HCC) 10/12/2014   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) 10/12/2014   Generalized anxiety disorder 10/12/2014   Moderate episode of recurrent major depressive disorder (HCC) 10/12/2014   Opioid abuse (HCC) 10/12/2014   History of hypothyroidism 10/12/2014   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   Severe protein-calorie malnutrition (HCC) 07/30/2014   Nausea &  vomiting 07/07/2014   Hypothyroidism, unspecified 07/07/2014   Depression with anxiety 07/07/2014   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/2013   Lipoma of abdominal wall 09/15/2013   Pleurisy 08/17/2010   Vomiting and diarrhea 01/23/2010   Cough 11/24/2009   Postmenopausal atrophic vaginitis 11/21/2009   Lumbar back sprain 10/10/2009   Clinical depression 07/12/2009   Arthralgia of multiple joints 06/30/2009   Benign neoplasm of thyroid gland 05/27/2009   Fast heart beat 05/10/2009   Persistent insomnia 02/07/2009   Mixed disorder as reaction to stress 10/09/2008   Boil of eyelid 08/01/2007   Chest pain on breathing 06/03/2007   LBP (low back pain) 03/17/2007   Epigastric pain 11/27/2006   Hypoglycemia 11/06/2005   Addiction, opium (HCC) 04/23/2002   Headache, migraine 04/23/1998   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 Chronic cough Will start with a workup her chronic cough there may be some neuroses involvement however we will do a complete workup including an upper GI alpha-1 and PFTs - Pulmonary function test; Future - Alpha-1-antitrypsin - DG UGI W DOUBLE CM (HD BA); Future  2. Mild intermittent asthma without complication Diagnosis of intermittent asthma has been noted in the past she states that she had flattening of the flow-volume loop on her previous PFTs and is indicative of tracheobronchomalacia - Pulmonary function test; Future  3. 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 Gastroesophageal reflux disease without esophagitis Will get upper GI for evaluation of GERD I think this may be significantly contributing to her cough   General Counseling: I have discussed the findings of the evaluation and examination with Julyssa.  I have also discussed any further diagnostic evaluation thatmay be needed or ordered today. Chandlar verbalizes understanding of the findings of todays visit. We  also reviewed her medications today and discussed drug interactions and side effects including but not limited excessive drowsiness and altered mental states. We also discussed that there is always a risk not just to her but also people around her. she has been encouraged to call the office with any questions or concerns that should arise related to todays visit.  Orders Placed This Encounter  Procedures   DG UGI W DOUBLE CM (HD BA)    Standing Status:   Future    Standing Expiration Date:   09/11/2023    Order Specific Question:   Reason for Exam (SYMPTOM  OR DIAGNOSIS REQUIRED)    Answer:   cough    Order Specific Question:   Is patient pregnant?    Answer:   No    Order Specific Question:   Preferred imaging location?    Answer:   [facility]  Alpha-1-antitrypsin   Pulmonary function test    Standing Status:   Future    Standing Expiration Date:   09/11/2023    Order Specific Question:   Where should this test be performed?    Answer:   [facility]     Time spent: 14  I have personally obtained a history, examined the patient, evaluated laboratory and imaging results, formulated the assessment and plan and placed orders.    Yevonne Pax, MD [facility] Pulmonary and Critical Care Sleep medicine

## 2022-09-12 ENCOUNTER — Encounter: Payer: Self-pay | Admitting: Internal Medicine

## 2022-09-19 ENCOUNTER — Encounter
Admission: RE | Admit: 2022-09-19 | Discharge: 2022-09-19 | Disposition: A | Discharge status: Home / Self Care | Payer: Medicaid Other | Source: Ambulatory Visit | Attending: Pulmonary Disease | Admitting: Pulmonary Disease

## 2022-09-19 ENCOUNTER — Encounter: Payer: Medicaid Other | Admitting: Internal Medicine

## 2022-09-19 VITALS — Ht 65.0 in | Wt 198.4 lb

## 2022-09-19 DIAGNOSIS — R0602 Shortness of breath: Secondary | ICD-10-CM

## 2022-09-19 DIAGNOSIS — Z01818 Encounter for other preprocedural examination: Secondary | ICD-10-CM

## 2022-09-19 DIAGNOSIS — I10 Essential (primary) hypertension: Secondary | ICD-10-CM

## 2022-09-19 HISTORY — DX: Headache, unspecified: R51.9

## 2022-09-19 HISTORY — DX: Type 2 diabetes mellitus without complications: E11.9

## 2022-09-19 HISTORY — DX: Solitary pulmonary nodule: R91.1

## 2022-09-19 HISTORY — DX: Other specified postprocedural states: Z98.890

## 2022-09-19 HISTORY — DX: Other specified diseases of upper respiratory tract: J39.8

## 2022-09-19 HISTORY — DX: Fibromyalgia: M79.7

## 2022-09-19 HISTORY — DX: Tachycardia, unspecified: R00.0

## 2022-09-19 HISTORY — DX: Sleep apnea, unspecified: G47.30

## 2022-09-19 HISTORY — DX: Nausea with vomiting, unspecified: R11.2

## 2022-09-19 HISTORY — DX: Restless legs syndrome: G25.81

## 2022-09-19 NOTE — Pre-Procedure Instructions (Addendum)
Pt lives out of town and was not given chg soap at dr [employer] office per pt. States she has some antibacterial soap from her recent 03-16-22 shoulder replacement surgery that she will use the night before and morning of surgery. Also, pt is not set up with my chart. I faxed surgery instructions to her email [email or web address] and verbally reviewed instructions over the phone

## 2022-09-19 NOTE — Patient Instructions (Signed)
Your procedure is scheduled on:09-28-22 Friday Report to the Registration Desk on the 1st floor of the [facility].Then proceed to the 2nd floor Surgery Desk To find out your arrival time, please call [phone] between 1PM - 3PM on:09-27-22 Thursday If your arrival time is 6:00 am, do not arrive before that time as the [facility] entrance doors do not open until 6:00 am.  REMEMBER: Instructions that are not followed completely may result in serious medical risk, up to and including death; or upon the discretion of your surgeon and anesthesiologist your surgery may need to be rescheduled.  Do not eat food after midnight the night before surgery.  No gum chewing or hard candies.  You may however, drink CLEAR liquids up to 2 hours before you are scheduled to arrive for your surgery. Do not drink anything within 2 hours of your scheduled arrival time.  Clear liquids include: - water  - apple juice without pulp - gatorade (not RED colors) - black coffee or tea (Do NOT add milk or creamers to the coffee or tea) Do NOT drink anything that is not on this list.  One week prior to surgery: Stop Anti-inflammatories (NSAIDS) such as Advil, Aleve, Ibuprofen, Motrin, Naproxen, Naprosyn and Aspirin based products such as Excedrin, Goody's Powder, BC Powder.You may however, take Tylenol if needed for pain up until the day of surgery. Stop ANY OVER THE COUNTER supplements/vitamins NOW (09-19-22) until after surgery.  TAKE ONLY THESE MEDICATIONS THE MORNING OF SURGERY WITH A SIP OF WATER: -gabapentin (NEURONTIN)  -levothyroxine (SYNTHROID)  -metoprolol tartrate (LOPRESSOR)  -methocarbamol (ROBAXIN)  -rOPINIRole (REQUIP)   Use your SYMBICORT and Albuterol Inhaler the day of surgery and bring your Albuterol Inhaler to the hospital  No Alcohol for 24 hours before or after surgery.  No Smoking including e-cigarettes for 24 hours before surgery.  No chewable tobacco products for at least 6 hours  before surgery.  No nicotine patches on the day of surgery.  Do not use any "recreational" drugs for at least a week (preferably 2 weeks) before your surgery.  Please be advised that the combination of cocaine and anesthesia may have negative outcomes, up to and including death. If you test positive for cocaine, your surgery will be cancelled.  On the morning of surgery brush your teeth with toothpaste and water, you may rinse your mouth with mouthwash if you wish. Do not swallow any toothpaste or mouthwash.  Do not wear jewelry, make-up, hairpins, clips or nail polish.  Do not wear lotions, powders, or perfumes.   Do not shave body hair from the neck down 48 hours before surgery.  Contact lenses, hearing aids and dentures may not be worn into surgery.  Do not bring valuables to the hospital. [facility] is not responsible for any missing/lost belongings or valuables.    Notify your doctor if there is any change in your medical condition (cold, fever, infection).  Wear comfortable clothing (specific to your surgery type) to the hospital.  After surgery, you can help prevent lung complications by doing breathing exercises.  Take deep breaths and cough every 1-2 hours. Your doctor may order a device called an Incentive Spirometer to help you take deep breaths. When coughing or sneezing, hold a pillow firmly against your incision with both hands. This is called "splinting." Doing this helps protect your incision. It also decreases belly discomfort.  If you are being admitted to the hospital overnight, leave your suitcase in the car. After surgery it  may be brought to your room.  In case of increased patient census, it may be necessary for you, the patient, to continue your postoperative care in the Same Day Surgery department.  If you are being discharged the day of surgery, you will not be allowed to drive home. You will need a responsible individual to drive you home and stay with you  for 24 hours after surgery.   If you are taking public transportation, you will need to have a responsible individual with you.  Please call the Pre-admissions Testing Dept. at [phone] if you have any questions about these instructions.  Surgery Visitation Policy:  Patients having surgery or a procedure may have two visitors.  Children under the age of 38 must have an adult with them who is not the patient.

## 2022-09-24 ENCOUNTER — Encounter
Admission: RE | Admit: 2022-09-24 | Discharge: 2022-09-24 | Disposition: A | Discharge status: Home / Self Care | Payer: Medicaid Other | Source: Ambulatory Visit | Attending: Pulmonary Disease | Admitting: Pulmonary Disease

## 2022-09-24 ENCOUNTER — Other Ambulatory Visit: Payer: Medicaid Other

## 2022-09-24 DIAGNOSIS — R0602 Shortness of breath: Secondary | ICD-10-CM | POA: Diagnosis not present

## 2022-09-24 DIAGNOSIS — I10 Essential (primary) hypertension: Secondary | ICD-10-CM | POA: Diagnosis not present

## 2022-09-24 DIAGNOSIS — Z01818 Encounter for other preprocedural examination: Secondary | ICD-10-CM

## 2022-09-24 LAB — PROTIME-INR
INR: 1 (ref 0.8–1.2)
Prothrombin Time: 13.4 seconds (ref 11.4–15.2)

## 2022-09-24 LAB — APTT: aPTT: 32 seconds (ref 24–36)

## 2022-09-27 MED ORDER — SODIUM CHLORIDE 0.9 % IV SOLN: INTRAVENOUS | Status: DC

## 2022-09-27 MED ORDER — CHLORHEXIDINE GLUCONATE 0.12 % MT SOLN
15.0000 mL | Freq: Once | OROMUCOSAL | Status: AC
  Administered 2022-09-28: 15 mL via OROMUCOSAL

## 2022-09-27 MED ORDER — FAMOTIDINE 20 MG PO TABS
20.0000 mg | ORAL_TABLET | Freq: Once | ORAL | Status: AC
  Administered 2022-09-28: 20 mg via ORAL

## 2022-09-27 MED ORDER — ORAL CARE MOUTH RINSE: 15.0000 mL | Freq: Once | OROMUCOSAL | Status: AC

## 2022-09-28 ENCOUNTER — Other Ambulatory Visit: Payer: Self-pay

## 2022-09-28 ENCOUNTER — Ambulatory Visit: Payer: Medicaid Other

## 2022-09-28 ENCOUNTER — Ambulatory Visit
Admission: RE | Admit: 2022-09-28 | Discharge: 2022-09-28 | Disposition: A | Discharge status: Home / Self Care | Payer: Medicaid Other | Attending: Pulmonary Disease | Admitting: Pulmonary Disease

## 2022-09-28 ENCOUNTER — Encounter: Payer: Self-pay | Admitting: *Deleted

## 2022-09-28 ENCOUNTER — Ambulatory Visit: Payer: Medicaid Other | Admitting: Urgent Care

## 2022-09-28 ENCOUNTER — Encounter
Admission: RE | Disposition: A | Discharge status: Home / Self Care | Payer: Self-pay | Source: Home / Self Care | Attending: Pulmonary Disease

## 2022-09-28 DIAGNOSIS — J398 Other specified diseases of upper respiratory tract: Secondary | ICD-10-CM | POA: Insufficient documentation

## 2022-09-28 HISTORY — PX: BRONCHIAL WASHINGS: SHX5105

## 2022-09-28 HISTORY — PX: FLEXIBLE BRONCHOSCOPY: SHX5094

## 2022-09-28 LAB — GLUCOSE, CAPILLARY: Glucose-Capillary: 96 mg/dL (ref 70–99)

## 2022-09-28 LAB — CULTURE, BAL-QUANTITATIVE W GRAM STAIN: Gram Stain: NONE SEEN

## 2022-09-28 SURGERY — BRONCHOSCOPY, FLEXIBLE
Anesthesia: General

## 2022-09-28 MED ORDER — LIDOCAINE HCL (CARDIAC) PF 100 MG/5ML IV SOSY
PREFILLED_SYRINGE | INTRAVENOUS | Status: DC | PRN
  Administered 2022-09-28: 100 mg via INTRAVENOUS

## 2022-09-28 MED ORDER — LACTATED RINGERS IV SOLN: INTRAVENOUS | Status: DC | PRN

## 2022-09-28 MED ORDER — OXYCODONE HCL 5 MG/5ML PO SOLN: 5.0000 mg | Freq: Once | ORAL | Status: DC | PRN

## 2022-09-28 MED ORDER — OXYCODONE HCL 5 MG PO TABS: 5.0000 mg | ORAL_TABLET | Freq: Once | ORAL | Status: DC | PRN

## 2022-09-28 MED ORDER — ONDANSETRON HCL 4 MG/2ML IJ SOLN
INTRAMUSCULAR | Status: DC | PRN
  Administered 2022-09-28: 4 mg via INTRAVENOUS

## 2022-09-28 MED ORDER — DEXAMETHASONE SODIUM PHOSPHATE 10 MG/ML IJ SOLN
INTRAMUSCULAR | Status: AC
  Filled 2022-09-28: qty 1

## 2022-09-28 MED ORDER — PROPOFOL 10 MG/ML IV BOLUS
INTRAVENOUS | Status: DC | PRN
  Administered 2022-09-28: 120 mg via INTRAVENOUS

## 2022-09-28 MED ORDER — MIDAZOLAM HCL 2 MG/2ML IJ SOLN
INTRAMUSCULAR | Status: AC
  Filled 2022-09-28: qty 2

## 2022-09-28 MED ORDER — ACETAMINOPHEN 10 MG/ML IV SOLN: 1000.0000 mg | Freq: Once | INTRAVENOUS | Status: DC | PRN

## 2022-09-28 MED ORDER — LACTATED RINGERS IV SOLN: INTRAVENOUS | Status: DC

## 2022-09-28 MED ORDER — IPRATROPIUM-ALBUTEROL 0.5-2.5 (3) MG/3ML IN SOLN
RESPIRATORY_TRACT | Status: AC
  Filled 2022-09-28: qty 3

## 2022-09-28 MED ORDER — LIDOCAINE HCL (PF) 2 % IJ SOLN
INTRAMUSCULAR | Status: AC
  Filled 2022-09-28: qty 5

## 2022-09-28 MED ORDER — FENTANYL CITRATE (PF) 100 MCG/2ML IJ SOLN
INTRAMUSCULAR | Status: AC
  Filled 2022-09-28: qty 2

## 2022-09-28 MED ORDER — ONDANSETRON HCL 4 MG/2ML IJ SOLN
INTRAMUSCULAR | Status: AC
  Filled 2022-09-28: qty 2

## 2022-09-28 MED ORDER — DEXAMETHASONE SODIUM PHOSPHATE 10 MG/ML IJ SOLN
INTRAMUSCULAR | Status: DC | PRN
  Administered 2022-09-28: 10 mg via INTRAVENOUS

## 2022-09-28 MED ORDER — BENZONATATE 100 MG PO CAPS: 200.0000 mg | ORAL_CAPSULE | Freq: Three times a day (TID) | ORAL | 0 refills | Status: AC | PRN

## 2022-09-28 MED ORDER — SUGAMMADEX SODIUM 200 MG/2ML IV SOLN
INTRAVENOUS | Status: DC | PRN
  Administered 2022-09-28: 200 mg via INTRAVENOUS

## 2022-09-28 MED ORDER — MIDAZOLAM HCL 2 MG/2ML IJ SOLN
INTRAMUSCULAR | Status: DC | PRN
  Administered 2022-09-28: 2 mg via INTRAVENOUS

## 2022-09-28 MED ORDER — PROPOFOL 10 MG/ML IV BOLUS
INTRAVENOUS | Status: AC
  Filled 2022-09-28: qty 20

## 2022-09-28 MED ORDER — FENTANYL CITRATE (PF) 100 MCG/2ML IJ SOLN: 25.0000 ug | INTRAMUSCULAR | Status: DC | PRN

## 2022-09-28 MED ORDER — ROCURONIUM BROMIDE 10 MG/ML (PF) SYRINGE
PREFILLED_SYRINGE | INTRAVENOUS | Status: AC
  Filled 2022-09-28: qty 10

## 2022-09-28 MED ORDER — ONDANSETRON HCL 4 MG/2ML IJ SOLN: 4.0000 mg | Freq: Once | INTRAMUSCULAR | Status: DC | PRN

## 2022-09-28 MED ORDER — ROCURONIUM BROMIDE 100 MG/10ML IV SOLN
INTRAVENOUS | Status: DC | PRN
  Administered 2022-09-28: 40 mg via INTRAVENOUS

## 2022-09-28 MED ORDER — FENTANYL CITRATE (PF) 100 MCG/2ML IJ SOLN
INTRAMUSCULAR | Status: DC | PRN
  Administered 2022-09-28: 50 ug via INTRAVENOUS

## 2022-09-28 MED ORDER — FAMOTIDINE 20 MG PO TABS
ORAL_TABLET | ORAL | Status: AC
  Filled 2022-09-28: qty 1

## 2022-09-28 MED ORDER — IPRATROPIUM-ALBUTEROL 0.5-2.5 (3) MG/3ML IN SOLN
3.0000 mL | Freq: Once | RESPIRATORY_TRACT | Status: AC
  Administered 2022-09-28: 3 mL via RESPIRATORY_TRACT

## 2022-09-28 NOTE — Procedures (Signed)
PROCEDURE: BRONCHOSCOPY Therapeutic Aspiration of Tracheobronchial Tree and BAL Dynamic bronchoscopy   PROCEDURE DATE: 09/28/2022  TIME:  NAME:  Jenna Tyler  DOB:18-May-1965  MRN: [id number] LOC:  [facility]/None    HOSP DAY: @LENGTHOFSTAYDAYS @ CODE STATUS:   Code Status History     Date Active Date Inactive Code Status Order ID Comments User Context   04/09/2022 2004 04/11/2022 1804 Full Code [id number]  Verdene Lennert, MD ED   10/04/2021 0117 10/05/2021 1445 Full Code [id number]  Gertha Calkin, MD ED   07/17/2020 0925 07/19/2020 2122 Full Code [id number]  Lorretta Harp, MD Inpatient   04/07/2020 2031 04/09/2020 2035 Full Code [id number]  Sung Amabile, DO Inpatient   07/10/2019 0014 07/11/2019 1643 Full Code [id number]  Lurline Del, MD ED   08/28/2018 2346 08/31/2018 1818 Full Code [id number]  Mansy, Vernetta Honey, MD ED   09/16/2017 2109 09/18/2017 2002 Full Code [id number]  Salary, Evelena Asa, MD Inpatient   07/12/2017 0409 07/16/2017 2057 Full Code [id number]  Arnaldo Natal ED   12/28/2016 2201 01/07/2017 1822 Full Code [id number]  Clapacs, Jackquline Denmark, MD Inpatient   12/28/2016 2201 12/28/2016 2201 Full Code [id number]  Clapacs, Jackquline Denmark, MD Inpatient   12/27/2016 0251 12/28/2016 2128 Full Code [id number]  Pershing Proud, Myra Rude, MD ED    Questions for Most Recent Historical Code Status (Order [id number])     Question Answer   By: Consent: discussion documented in EHR                Indications/Preliminary Diagnosis:   Consent: (Place X beside choice/s below)  The benefits, risks and possible complications of the procedure were        explained to:  _x__ patient  ___ patient's family  ___ other:___________  who verbalized understanding and gave:  ___ verbal  __x_ written  ___ verbal and written  ___ telephone  ___ other:________ consent.      Unable to obtain consent; procedure performed on emergent basis.     Other:       PRESEDATION ASSESSMENT: History and Physical has been performed. Patient  meds and allergies have been reviewed. Presedation airway examination has been performed and documented. Baseline vital signs, sedation score, oxygenation status, and cardiac rhythm were reviewed. Patient was deemed to be in satisfactory condition to undergo the procedure.     PATIENT HAD NOTABLE EXCESSIVE DYNAMIC AIRWAY COLLAPSE MOST NOTABLE AT CENTRAL AIRWAY, AND LEFT MAINSTEM BRONCHUS.    PROCEDURE DETAILS: Timeout performed and correct patient, name, & ID confirmed. Following prep per Pulmonary policy, appropriate sedation was administered. The Bronchoscope was inserted in to oral cavity with bite block in place. Therapeutic aspiration of Tracheobronchial tree was performed.  Airway exam proceeded with findings, technical procedures, and specimen collection as noted below. At the end of exam the scope was withdrawn without incident. Impression and Plan as noted below.           Airway Prep (Place X beside choice below)   1% Transtracheal Lidocaine Anesthetization 7 cc   Patient prepped per Bronchoscopy Lab Policy       Insertion Route (Place X beside choice below)   Nasal   Oral   Endotracheal Tube   Tracheostomy   INTRAPROCEDURE MEDICATIONS:  Sedative/Narcotic Amt Dose  As per anesthesia                 Medication Amt Dose  Medication Amt Dose  Lidocaine 1%  cc  Epinephrine 1:10,000 sol  cc  Xylocaine  4%  cc  Cocaine  cc   TECHNICAL PROCEDURES: (Place X beside choice below)   Procedures  Description    None     Electrocautery     Cryotherapy     Balloon Dilatation     Bronchography     Stent Placement   x  Therapeutic Aspiration At left upper lobe    Laser/Argon Plasma    Brachytherapy Catheter Placement    Foreign Body Removal         SPECIMENS (Sites): (Place X beside choice below)  Specimens Description   No Specimens Obtained     Washings   x Lavage Left upper lobe   Biopsies    Fine Needle Aspirates    Brushings    Sputum    FINDINGS:   ESTIMATED BLOOD LOSS: none COMPLICATIONS/RESOLUTION: none      IMPRESSION:POST-PROCEDURE DX:     RECOMMENDATION/PLAN:   Await cultures.  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  Pulmonary & Critical Care Medicine  [facility]

## 2022-09-28 NOTE — Anesthesia Postprocedure Evaluation (Signed)
Anesthesia Post Note  Patient: Jenna Tyler  Procedure(s) Performed: FLEXIBLE BRONCHOSCOPY BRONCHIAL WASHINGS  Patient location during evaluation: PACU Anesthesia Type: General Level of consciousness: awake and alert, oriented and patient cooperative Pain management: pain level controlled Vital Signs Assessment: post-procedure vital signs reviewed and stable Respiratory status: spontaneous breathing, nonlabored ventilation and respiratory function stable Cardiovascular status: blood pressure returned to baseline and stable Postop Assessment: adequate PO intake Anesthetic complications: no   There were no known notable events for this encounter.   Last Vitals:  Vitals:   09/28/22 1312 09/28/22 1315  BP:  115/65  Pulse: 77 77  Resp: (!) 21 15  Temp:    SpO2: 96% 97%    Last Pain:  Vitals:   09/28/22 1305  TempSrc:   PainSc: 0-No pain                 Reed Breech

## 2022-09-28 NOTE — H&P (Signed)
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 She is here today for airway inspection and therapeutic aspiration of tracheobronchial tree, BAL and dynamic bronchoscopy for evaluation of dynamic airway collapse and TBM.Reviewed risks/complications and benefits with patient, risks include infection, pneumothorax/pneumomediastinum which may require chest tube placement as well as overnight/prolonged hospitalization and possible mechanical ventilation. Other risks include bleeding and very rarely death.  Patient understands risks and wishes to proceed.  Additional questions were answered, and patient is aware that post procedure patient will be going home with family and may experience cough with possible clots on expectoration as well as phlegm which may last few days as well as hoarseness of voice post intubation and mechanical ventilation.    PAST MEDICAL HISTORY   Past Medical History:  Diagnosis Date   Asthma    COPD (chronic obstructive pulmonary disease) (HCC)    DM (diabetes mellitus), type 2 (HCC)    Dyspnea    Fibromyalgia    Guillain Barr syndrome (HCC)    Headache    Hyperlipidemia    Hypertension    Hypothyroidism    Neurofibromatosis, peripheral, NF1 (HCC) 2017   Pancreatitis    PONV (postoperative nausea and vomiting)    Pulmonary nodule    RLS (restless legs syndrome)    Sleep apnea    never went to pick up her cpap   Tachycardia    Tracheomalacia      SURGICAL HISTORY   Past Surgical History:  Procedure Laterality Date   ABDOMINAL HYSTERECTOMY      BREAST BIOPSY Right 2012   negative   CHOLECYSTECTOMY     FOOT SURGERY     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   OTHER SURGICAL HISTORY     excision of lipoma   OTHER SURGICAL HISTORY     Abdominal surgery   THYROID SURGERY     thyroidectomy   XI ROBOTIC LAPAROSCOPIC ASSISTED APPENDECTOMY N/A 04/08/2020   Procedure: XI ROBOTIC LAPAROSCOPIC ASSISTED APPENDECTOMY;  Surgeon: Sung Amabile, DO;  Location: [facility] ORS;  Service: General;  Laterality: N/A;     FAMILY HISTORY   Family History  Problem Relation Age of Onset   Breast cancer Sister 24   Breast cancer Maternal Aunt 40   Breast cancer Maternal Grandmother 60   Breast cancer Maternal Aunt 40   Breast cancer Cousin 40       maternal side     SOCIAL HISTORY   Social History   Tobacco Use   Smoking status: Former    Packs/day: 1.00    Years: 15.00    Additional pack years: 0.00    Total pack years: 15.00    Types: Cigarettes    Quit date: 12/28/1997    Years since quitting: 24.7   Smokeless tobacco: Never  Vaping Use   Vaping Use: Never used  Substance Use Topics   Alcohol use: Not Currently    Alcohol/week: 0.0 standard drinks of alcohol   Drug use: No  MEDICATIONS    Home Medication:    Current Medication:  Current Facility-Administered Medications:    0.9 %  sodium chloride infusion, , Intravenous, Continuous, Stephanie Coup, MD    ALLERGIES   Nsaids, Paxlovid [nirmatrelvir-ritonavir], Tizanidine, Adhesive [tape], Aspirin, and Penicillins     REVIEW OF SYSTEMS    Review of Systems:  Gen:  Denies  fever, sweats, chills weigh loss  HEENT: Denies blurred vision, double vision, ear pain, eye pain, hearing loss, nose bleeds, sore throat Cardiac:  No dizziness, chest pain or heaviness, chest tightness,edema Resp:   reports dyspnea chronically  Gi: Denies swallowing difficulty, stomach pain, nausea or vomiting, diarrhea, constipation, bowel incontinence Gu:  Denies bladder incontinence,  burning urine Ext:   Denies Joint pain, stiffness or swelling Skin: Denies  skin rash, easy bruising or bleeding or hives Endoc:  Denies polyuria, polydipsia , polyphagia or weight change Psych:   Denies depression, insomnia or hallucinations   Other:  All other systems negative   VS: BP 121/77   Pulse 82   Temp (!) 97.1 F (36.2 C) (Tympanic)   Resp 18   Ht 5\' 5"  (1.651 m)   Wt 89.8 kg   SpO2 99%   BMI 32.95 kg/m      PHYSICAL EXAM    GENERAL:NAD, no fevers, chills, no weakness no fatigue HEAD: Normocephalic, atraumatic.  EYES: Pupils equal, round, reactive to light. Extraocular muscles intact. No scleral icterus.  MOUTH: Moist mucosal membrane. Dentition intact. No abscess noted.  EAR, NOSE, THROAT: Clear without exudates. No external lesions.  NECK: Supple. No thyromegaly. No nodules. No JVD.  PULMONARY: decreased breath sounds with mild rhonchi worse at bases bilaterally.  CARDIOVASCULAR: S1 and S2. Regular rate and rhythm. No murmurs, rubs, or gallops. No edema. Pedal pulses 2+ bilaterally.  GASTROINTESTINAL: Soft, nontender, nondistended. No masses. Positive bowel sounds. No hepatosplenomegaly.  MUSCULOSKELETAL: No swelling, clubbing, or edema. Range of motion full in all extremities.  NEUROLOGIC: Cranial nerves II through XII are intact. No gross focal neurological deficits. Sensation intact. Reflexes intact.  SKIN: No ulceration, lesions, rashes, or cyanosis. Skin warm and dry. Turgor intact.  PSYCHIATRIC: Mood, affect within normal limits. The patient is awake, alert and oriented x 3. Insight, judgment intact.       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    -Reviewed risks/complications and benefits with patient,  risks include infection, pneumothorax/pneumomediastinum which may require chest tube placement as well as overnight/prolonged hospitalization and possible mechanical ventilation. Other risks include bleeding and very rarely death.  Patient understands risks and wishes to proceed.  Additional questions were answered, and patient is aware that post procedure patient will be going home with family and may experience cough with possible clots on expectoration as well as phlegm which may last few days as well as hoarseness of voice post intubation and mechanical ventilation.        Thank you for allowing me to participate in the care of this patient.   Patient/Family are satisfied with care plan and all questions have been answered.    Provider disclosure: Patient with at least one acute or chronic illness or injury that poses a threat to life or bodily function and is being managed actively during this encounter.  All of the below services have been performed  independently by signing provider:  review of prior documentation from internal and or external health records.  Review of previous and current lab results.  Interview and comprehensive assessment during patient visit today. Review of current and previous chest radiographs/CT scans. Discussion of management and test interpretation with health care team and patient/family.   This document was prepared using Dragon voice recognition software and may include unintentional dictation errors.     Ottie Glazier, M.D.  Division of Pulmonary & Critical Care Medicine

## 2022-09-28 NOTE — Anesthesia Preprocedure Evaluation (Signed)
Anesthesia Evaluation    History of Anesthesia Complications (+) PONV and history of anesthetic complications  Airway Mallampati: III       Dental   Pulmonary asthma , COPD, former smoker (quit 1999) Tracheobronchomalacia           Cardiovascular hypertension,   ECG 09/24/22:  Normal sinus rhythm Nonspecific T wave abnormality  Echo 01/18/22:  NORMAL LEFT VENTRICULAR SYSTOLIC FUNCTION  NORMAL RIGHT VENTRICULAR SYSTOLIC FUNCTION  MILD VALVULAR REGURGITATION  NO VALVULAR STENOSIS  TRIVIAL MR, TR  MILD PR  EF >55%   Myocardial perfusion 01/01/22:   Normal myocardial perfusion scan no evidence of stress-induced  myocardial ischemia ejection fraction of 61% with normal wall motion  conclusion low risk study.    Neuro/Psych  Headaches, Seizures -,  PSYCHIATRIC DISORDERS Anxiety Depression    Neurofibromatosis   Neuromuscular disease (Guillain Barre syndrome)    GI/Hepatic PUD,GERD  ,,  Endo/Other  diabetes, Type 2Hypothyroidism  Obesity   Renal/GU Renal disease (CKD)     Musculoskeletal  (+)  Fibromyalgia -  Abdominal   Peds  Hematology   Anesthesia Other Findings   Reproductive/Obstetrics                              Anesthesia Physical Anesthesia Plan  ASA: 3  Anesthesia Plan: General   Post-op Pain Management:    Induction: Intravenous  PONV Risk Score and Plan: 4 or greater and Ondansetron, Dexamethasone and Treatment may vary due to age or medical condition  Airway Management Planned: Oral ETT  Additional Equipment:   Intra-op Plan:   Post-operative Plan: Extubation in OR  Informed Consent: I have reviewed the patients History and Physical, chart, labs and discussed the procedure including the risks, benefits and alternatives for the proposed anesthesia with the patient or authorized representative who has indicated his/her understanding and acceptance.     Dental  advisory given  Plan Discussed with: CRNA  Anesthesia Plan Comments: (Avoid succinylcholine for hx Guillain Barre syndrome.    Patient consented for risks of anesthesia including but not limited to:  - adverse reactions to medications - damage to eyes, teeth, lips or other oral mucosa - nerve damage due to positioning  - sore throat or hoarseness - damage to heart, brain, nerves, lungs, other parts of body or loss of life  Informed patient about role of CRNA in peri- and intra-operative care.  Patient voiced understanding.)         Anesthesia Quick Evaluation

## 2022-09-28 NOTE — Discharge Instructions (Addendum)
AMBULATORY SURGERY  DISCHARGE INSTRUCTIONS  The drugs that you were given will stay in your system until tomorrow so for the next 24 hours you should not:  Drive an automobile Make any legal decisions Drink any alcoholic beverage  You may resume regular meals tomorrow.  Today it is better to start with liquids and gradually work up to solid foods.  You may eat anything you prefer, but it is better to start with liquids, then soup and crackers, and gradually work up to solid foods.  Please notify your doctor immediately if you have any unusual bleeding, trouble breathing, redness and pain at the surgery site, drainage, fever, or pain not relieved by medication.  Additional Instructions:  Please contact your physician with any problems or Same Day Surgery at [phone], Monday through Friday 6 am to 4 pm, or [place] [facility] number at [phone].      Flexible Bronchoscopy  Flexible bronchoscopy is a procedure used to examine the passageways in the lungs. During the procedure, a thin, flexible tool with a camera (bronchoscope) is passed into the mouth or nose, down through the windpipe (trachea), and into the air tubes in the lungs (bronchi). This tool allows the health care provider to look inside the lungs and to take samples for testing, if needed. Tell a health care provider about: Any allergies you have. All medicines you are taking, including vitamins, herbs, eye drops, creams, and over-the-counter medicines. Any problems you or family members have had with anesthetic medicines. Any bleeding problems you have. Any surgeries you have had. Any medical conditions you have. Whether you are pregnant or may be pregnant. What are the risks? Your healthcare provider will talk with you about risks. These may include: Infection. Bleeding. Damage to other structures or organs. Allergic reactions to medicines. Collapsed lung (pneumothorax). Increased need for oxygen  or difficulty breathing after the procedure. What happens before the procedure? When to stop eating and drinking  Follow instructions from your health care provider about what you may eat and drink. These may include: 8 hours before your procedure Stop eating most foods. Do not eat meat, fried foods, or fatty foods. Eat only light foods, such as toast or crackers. All liquids are okay except energy drinks and alcohol. 6 hours before your procedure Stop eating. Drink only clear liquids, such as water, clear fruit juice, black coffee, plain tea, and sports drinks. Do not drink energy drinks or alcohol. 2 hours before your procedure Stop drinking all liquids. You may be allowed to take medicines with small sips of water. If you do not follow your health care provider's instructions, your procedure may be delayed or canceled. Medicines Ask your health care provider about: Changing or stopping your regular medicines. These include any diabetes medicines or blood thinners you take. Taking medicines such as aspirin and ibuprofen. These medicines can thin your blood. Do not take them unless your health care provider tells you to. Taking over-the-counter medicines, vitamins, herbs, and supplements. General instructions You may be given antibiotic medicine to help lower the risk of infection. If you will be going home right after the procedure, plan to have a responsible adult: Take you home from the hospital or clinic. You will not be allowed to drive. Care for you for the time you are told. What happens during the procedure? An IV will be inserted into one of your veins. You will be given a medicine (local anesthetic) to numb your mouth, nose, throat, and voice  box (larynx). You may also be given one or more of the following: A medicine to help you relax (sedative). A medicine to control coughing. A medicine to dry up any fluids or secretions in your lungs. A bronchoscope will be passed into  your nose or mouth, and into your lungs. Your health care provider will examine your lungs. Samples of airway secretions may be collected for testing. If abnormal areas are seen in your airways, samples of tissue may be removed and checked under a microscope (biopsy). If tissue samples are needed from the outer parts of the lung, a type of X-ray (fluoroscopy) may be used to guide the bronchoscope to these areas. If bleeding occurs, you may be given medicine to stop or decrease the bleeding. The procedure may vary among health care providers and hospitals. What happens after the procedure? Your blood pressure, heart rate, breathing rate, and blood oxygen level will be monitored until you leave the hospital or clinic. You may have a chest X-ray to check for signs of pneumothorax. You willnot be allowed to eat or drink anything for 2 hours after your procedure. If a biopsy was taken, it is up to you to get the results of the test. Ask your health care provider, or the department that is doing the procedure, when your results will be ready. Contact a health care provider if: You have a fever. Get help right away if: You have shortness of breath that gets worse. You get light-headed or feel like you might faint. You have chest pain. You cough up more than a small amount of blood. These symptoms may be an emergency. Get help right away. Call 911. Do not wait to see if the symptoms will go away. Do not drive yourself to the hospital. Summary Flexible bronchoscopy is a procedure that allows your health care provider to look closely inside your lungs and to take testing samples if needed. Risks of flexible bronchoscopy include bleeding, infection, and collapsed lung (pneumothorax). Before the procedure, you will be given a medicine to numb your mouth, nose, throat, and voice box. Then, a bronchoscope will be passed into your nose or mouth, and into your lungs. After the procedure, your blood  pressure, heart rate, breathing rate, and blood oxygen level will be monitored until you leave the hospital or clinic. You may have a chest X-ray to check for signs of pneumothorax. This information is not intended to replace advice given to you by your health care provider. Make sure you discuss any questions you have with your health care provider. Document Revised: 07/18/2021 Document Reviewed: 07/18/2021 Elsevier Patient Education  2024 [employer].

## 2022-09-28 NOTE — Anesthesia Procedure Notes (Signed)
Procedure Name: Intubation Date/Time: 09/28/2022 12:41 PM  Performed by: Lysbeth Penner, CRNAPre-anesthesia Checklist: Patient identified, Emergency Drugs available, Suction available and Patient being monitored Patient Re-evaluated:Patient Re-evaluated prior to induction Oxygen Delivery Method: Circle system utilized Preoxygenation: Pre-oxygenation with 100% oxygen Induction Type: IV induction Ventilation: Mask ventilation without difficulty Laryngoscope Size: 3 and Mac Grade View: Grade II Tube type: Oral Tube size: 8.5 mm Number of attempts: 1 Airway Equipment and Method: Stylet and Oral airway Placement Confirmation: ETT inserted through vocal cords under direct vision, positive ETCO2 and breath sounds checked- equal and bilateral Tube secured with: Tape Dental Injury: Teeth and Oropharynx as per pre-operative assessment

## 2022-09-28 NOTE — Transfer of Care (Signed)
Immediate Anesthesia Transfer of Care Note  Patient: Jenna Tyler  Procedure(s) Performed: FLEXIBLE BRONCHOSCOPY BRONCHIAL WASHINGS  Patient Location: PACU  Anesthesia Type:General  Level of Consciousness: awake, alert , and oriented  Airway & Oxygen Therapy: Patient Spontanous Breathing  Post-op Assessment: Report given to RN and Post -op Vital signs reviewed and stable  Post vital signs: Reviewed and stable  Last Vitals:  Vitals Value Taken Time  BP 109/76 09/28/22 1305  Temp 36.1 C 09/28/22 1305  Pulse 75 09/28/22 1310  Resp 14 09/28/22 1310  SpO2 94 % 09/28/22 1310  Vitals shown include unvalidated device data.  Last Pain:  Vitals:   09/28/22 1305  TempSrc:   PainSc: 0-No pain      Patients Stated Pain Goal: 2 (09/28/22 1129)  Complications: There were no known notable events for this encounter.

## 2022-09-29 ENCOUNTER — Encounter: Payer: Self-pay | Admitting: Pulmonary Disease

## 2022-09-29 LAB — CULTURE, BAL-QUANTITATIVE W GRAM STAIN

## 2022-09-30 LAB — ACID FAST SMEAR (AFB, MYCOBACTERIA): Acid Fast Smear: NEGATIVE

## 2022-10-01 ENCOUNTER — Encounter: Payer: Self-pay | Admitting: Internal Medicine

## 2022-10-01 LAB — CULTURE, BAL-QUANTITATIVE W GRAM STAIN: Culture: NO GROWTH

## 2022-10-02 ENCOUNTER — Emergency Department: Payer: Medicaid Other

## 2022-10-02 ENCOUNTER — Other Ambulatory Visit: Payer: Self-pay

## 2022-10-02 ENCOUNTER — Encounter: Payer: Self-pay | Admitting: Intensive Care

## 2022-10-02 ENCOUNTER — Inpatient Hospital Stay
Admission: EM | Admit: 2022-10-02 | Discharge: 2022-10-04 | DRG: 871 | Disposition: A | Discharge status: Home / Self Care | Payer: Medicaid Other | Attending: Internal Medicine | Admitting: Internal Medicine

## 2022-10-02 DIAGNOSIS — A419 Sepsis, unspecified organism: Secondary | ICD-10-CM | POA: Diagnosis present

## 2022-10-02 DIAGNOSIS — Z888 Allergy status to other drugs, medicaments and biological substances status: Secondary | ICD-10-CM

## 2022-10-02 DIAGNOSIS — Z9049 Acquired absence of other specified parts of digestive tract: Secondary | ICD-10-CM

## 2022-10-02 DIAGNOSIS — E890 Postprocedural hypothyroidism: Secondary | ICD-10-CM | POA: Diagnosis present

## 2022-10-02 DIAGNOSIS — Q8501 Neurofibromatosis, type 1: Secondary | ICD-10-CM | POA: Diagnosis not present

## 2022-10-02 DIAGNOSIS — Z880 Allergy status to penicillin: Secondary | ICD-10-CM | POA: Diagnosis not present

## 2022-10-02 DIAGNOSIS — G8929 Other chronic pain: Secondary | ICD-10-CM | POA: Diagnosis present

## 2022-10-02 DIAGNOSIS — G4733 Obstructive sleep apnea (adult) (pediatric): Secondary | ICD-10-CM | POA: Diagnosis present

## 2022-10-02 DIAGNOSIS — Z7951 Long term (current) use of inhaled steroids: Secondary | ICD-10-CM

## 2022-10-02 DIAGNOSIS — E860 Dehydration: Secondary | ICD-10-CM | POA: Diagnosis present

## 2022-10-02 DIAGNOSIS — G2581 Restless legs syndrome: Secondary | ICD-10-CM | POA: Diagnosis present

## 2022-10-02 DIAGNOSIS — I10 Essential (primary) hypertension: Secondary | ICD-10-CM | POA: Diagnosis present

## 2022-10-02 DIAGNOSIS — R6520 Severe sepsis without septic shock: Secondary | ICD-10-CM | POA: Diagnosis present

## 2022-10-02 DIAGNOSIS — E785 Hyperlipidemia, unspecified: Secondary | ICD-10-CM | POA: Diagnosis present

## 2022-10-02 DIAGNOSIS — Z79890 Hormone replacement therapy (postmenopausal): Secondary | ICD-10-CM | POA: Diagnosis not present

## 2022-10-02 DIAGNOSIS — J398 Other specified diseases of upper respiratory tract: Secondary | ICD-10-CM | POA: Diagnosis present

## 2022-10-02 DIAGNOSIS — J441 Chronic obstructive pulmonary disease with (acute) exacerbation: Secondary | ICD-10-CM | POA: Diagnosis present

## 2022-10-02 DIAGNOSIS — E039 Hypothyroidism, unspecified: Secondary | ICD-10-CM | POA: Diagnosis present

## 2022-10-02 DIAGNOSIS — J9811 Atelectasis: Secondary | ICD-10-CM | POA: Diagnosis present

## 2022-10-02 DIAGNOSIS — Z87891 Personal history of nicotine dependence: Secondary | ICD-10-CM

## 2022-10-02 DIAGNOSIS — E119 Type 2 diabetes mellitus without complications: Secondary | ICD-10-CM | POA: Diagnosis present

## 2022-10-02 DIAGNOSIS — E669 Obesity, unspecified: Secondary | ICD-10-CM | POA: Diagnosis present

## 2022-10-02 DIAGNOSIS — J440 Chronic obstructive pulmonary disease with acute lower respiratory infection: Secondary | ICD-10-CM | POA: Diagnosis present

## 2022-10-02 DIAGNOSIS — J189 Pneumonia, unspecified organism: Secondary | ICD-10-CM | POA: Diagnosis present

## 2022-10-02 DIAGNOSIS — M797 Fibromyalgia: Secondary | ICD-10-CM | POA: Diagnosis present

## 2022-10-02 DIAGNOSIS — Z90710 Acquired absence of both cervix and uterus: Secondary | ICD-10-CM | POA: Diagnosis not present

## 2022-10-02 DIAGNOSIS — N179 Acute kidney failure, unspecified: Secondary | ICD-10-CM | POA: Diagnosis present

## 2022-10-02 DIAGNOSIS — F331 Major depressive disorder, recurrent, moderate: Secondary | ICD-10-CM | POA: Diagnosis present

## 2022-10-02 DIAGNOSIS — Z79899 Other long term (current) drug therapy: Secondary | ICD-10-CM

## 2022-10-02 DIAGNOSIS — Z803 Family history of malignant neoplasm of breast: Secondary | ICD-10-CM

## 2022-10-02 DIAGNOSIS — Z6831 Body mass index (BMI) 31.0-31.9, adult: Secondary | ICD-10-CM

## 2022-10-02 HISTORY — DX: Chronic obstructive pulmonary disease with (acute) exacerbation: J44.1

## 2022-10-02 LAB — CBC
HCT: 47.6 % — ABNORMAL HIGH (ref 36.0–46.0)
Hemoglobin: 15.7 g/dL — ABNORMAL HIGH (ref 12.0–15.0)
MCH: 28.5 pg (ref 26.0–34.0)
MCHC: 33 g/dL (ref 30.0–36.0)
MCV: 86.4 fL (ref 80.0–100.0)
Platelets: 256 10*3/uL (ref 150–400)
RBC: 5.51 MIL/uL — ABNORMAL HIGH (ref 3.87–5.11)
RDW: 13.1 % (ref 11.5–15.5)
WBC: 12.2 10*3/uL — ABNORMAL HIGH (ref 4.0–10.5)
nRBC: 0 % (ref 0.0–0.2)

## 2022-10-02 LAB — LACTIC ACID, PLASMA: Lactic Acid, Venous: 1.7 mmol/L (ref 0.5–1.9)

## 2022-10-02 LAB — BASIC METABOLIC PANEL
Anion gap: 11 (ref 5–15)
BUN: 20 mg/dL (ref 6–20)
CO2: 23 mmol/L (ref 22–32)
Calcium: 9.3 mg/dL (ref 8.9–10.3)
Chloride: 102 mmol/L (ref 98–111)
Creatinine, Ser: 1.23 mg/dL — ABNORMAL HIGH (ref 0.44–1.00)
GFR, Estimated: 52 mL/min — ABNORMAL LOW (ref >60)
Glucose, Bld: 112 mg/dL — ABNORMAL HIGH (ref 70–99)
Potassium: 4.1 mmol/L (ref 3.5–5.1)
Sodium: 136 mmol/L (ref 135–145)

## 2022-10-02 LAB — TROPONIN I (HIGH SENSITIVITY): Troponin I (High Sensitivity): <2 ng/L (ref <18)

## 2022-10-02 LAB — FUNGUS CULTURE WITH STAIN

## 2022-10-02 LAB — PROCALCITONIN: Procalcitonin: <0.1 ng/mL

## 2022-10-02 LAB — FUNGUS CULTURE RESULT

## 2022-10-02 MED ORDER — LACTATED RINGERS IV SOLN
150.0000 mL/h | INTRAVENOUS | Status: DC
  Administered 2022-10-02 – 2022-10-03 (×2): 150 mL/h via INTRAVENOUS

## 2022-10-02 MED ORDER — ACETAMINOPHEN 325 MG PO TABS
650.0000 mg | ORAL_TABLET | Freq: Four times a day (QID) | ORAL | Status: DC | PRN
  Administered 2022-10-03 (×2): 650 mg via ORAL
  Filled 2022-10-02 (×2): qty 2

## 2022-10-02 MED ORDER — IPRATROPIUM-ALBUTEROL 0.5-2.5 (3) MG/3ML IN SOLN
3.0000 mL | Freq: Once | RESPIRATORY_TRACT | Status: AC
  Administered 2022-10-02: 3 mL via RESPIRATORY_TRACT
  Filled 2022-10-02: qty 3

## 2022-10-02 MED ORDER — LEVOFLOXACIN IN D5W 750 MG/150ML IV SOLN
750.0000 mg | Freq: Once | INTRAVENOUS | Status: AC
  Administered 2022-10-02: 750 mg via INTRAVENOUS
  Filled 2022-10-02: qty 150

## 2022-10-02 MED ORDER — METOPROLOL TARTRATE 25 MG PO TABS
25.0000 mg | ORAL_TABLET | ORAL | Status: DC
  Administered 2022-10-03 – 2022-10-04 (×2): 25 mg via ORAL
  Filled 2022-10-02 (×2): qty 1

## 2022-10-02 MED ORDER — METHYLPREDNISOLONE SODIUM SUCC 125 MG IJ SOLR
125.0000 mg | Freq: Once | INTRAMUSCULAR | Status: AC
  Administered 2022-10-02: 125 mg via INTRAVENOUS
  Filled 2022-10-02: qty 2

## 2022-10-02 MED ORDER — SODIUM CHLORIDE 0.9 % IV SOLN
2.0000 g | INTRAVENOUS | Status: DC
  Administered 2022-10-03: 2 g via INTRAVENOUS
  Filled 2022-10-02 (×2): qty 20

## 2022-10-02 MED ORDER — ROPINIROLE HCL 1 MG PO TABS
1.0000 mg | ORAL_TABLET | Freq: Two times a day (BID) | ORAL | Status: DC
  Administered 2022-10-02 – 2022-10-04 (×4): 1 mg via ORAL
  Filled 2022-10-02 (×5): qty 1

## 2022-10-02 MED ORDER — IPRATROPIUM-ALBUTEROL 0.5-2.5 (3) MG/3ML IN SOLN
3.0000 mL | Freq: Four times a day (QID) | RESPIRATORY_TRACT | Status: DC
  Administered 2022-10-02 – 2022-10-03 (×3): 3 mL via RESPIRATORY_TRACT
  Filled 2022-10-02 (×4): qty 3

## 2022-10-02 MED ORDER — AMITRIPTYLINE HCL 25 MG PO TABS
100.0000 mg | ORAL_TABLET | Freq: Every day | ORAL | Status: DC
  Administered 2022-10-03 (×2): 100 mg via ORAL
  Filled 2022-10-02 (×2): qty 4

## 2022-10-02 MED ORDER — METHYLPREDNISOLONE SODIUM SUCC 40 MG IJ SOLR
40.0000 mg | Freq: Two times a day (BID) | INTRAMUSCULAR | Status: AC
  Administered 2022-10-02 – 2022-10-03 (×2): 40 mg via INTRAVENOUS
  Filled 2022-10-02 (×2): qty 1

## 2022-10-02 MED ORDER — ONDANSETRON HCL 4 MG/2ML IJ SOLN: 4.0000 mg | Freq: Four times a day (QID) | INTRAMUSCULAR | Status: DC | PRN

## 2022-10-02 MED ORDER — ENOXAPARIN SODIUM 40 MG/0.4ML IJ SOSY
40.0000 mg | PREFILLED_SYRINGE | INTRAMUSCULAR | Status: DC
  Administered 2022-10-02 – 2022-10-03 (×2): 40 mg via SUBCUTANEOUS
  Filled 2022-10-02 (×2): qty 0.4

## 2022-10-02 MED ORDER — HYDROCOD POLI-CHLORPHE POLI ER 10-8 MG/5ML PO SUER
5.0000 mL | Freq: Once | ORAL | Status: AC
  Administered 2022-10-02: 5 mL via ORAL
  Filled 2022-10-02: qty 5

## 2022-10-02 MED ORDER — GABAPENTIN 400 MG PO CAPS
800.0000 mg | ORAL_CAPSULE | Freq: Two times a day (BID) | ORAL | Status: DC
  Administered 2022-10-02 – 2022-10-04 (×4): 800 mg via ORAL
  Filled 2022-10-02 (×5): qty 2

## 2022-10-02 MED ORDER — HYDROCODONE-ACETAMINOPHEN 5-325 MG PO TABS
1.0000 | ORAL_TABLET | ORAL | Status: DC | PRN
  Administered 2022-10-02: 1 via ORAL
  Administered 2022-10-03 – 2022-10-04 (×5): 2 via ORAL
  Filled 2022-10-02: qty 1
  Filled 2022-10-02 (×6): qty 2

## 2022-10-02 MED ORDER — ONDANSETRON HCL 4 MG PO TABS: 4.0000 mg | ORAL_TABLET | Freq: Four times a day (QID) | ORAL | Status: DC | PRN

## 2022-10-02 MED ORDER — ACETAMINOPHEN 650 MG RE SUPP: 650.0000 mg | Freq: Four times a day (QID) | RECTAL | Status: DC | PRN

## 2022-10-02 MED ORDER — ALBUTEROL SULFATE (2.5 MG/3ML) 0.083% IN NEBU: 2.5000 mg | INHALATION_SOLUTION | RESPIRATORY_TRACT | Status: DC | PRN

## 2022-10-02 MED ORDER — BENZONATATE 100 MG PO CAPS: 200.0000 mg | ORAL_CAPSULE | Freq: Three times a day (TID) | ORAL | Status: DC | PRN

## 2022-10-02 MED ORDER — HYDROCOD POLI-CHLORPHE POLI ER 10-8 MG/5ML PO SUER
5.0000 mL | Freq: Two times a day (BID) | ORAL | Status: DC
  Administered 2022-10-03 – 2022-10-04 (×4): 5 mL via ORAL
  Filled 2022-10-02 (×4): qty 5

## 2022-10-02 MED ORDER — LEVOTHYROXINE SODIUM 50 MCG PO TABS: 50.0000 ug | ORAL_TABLET | Freq: Every day | ORAL | Status: DC

## 2022-10-02 MED ORDER — SODIUM CHLORIDE 0.9 % IV SOLN
500.0000 mg | INTRAVENOUS | Status: DC
  Administered 2022-10-03: 500 mg via INTRAVENOUS
  Filled 2022-10-02 (×2): qty 5

## 2022-10-02 MED ORDER — PREDNISONE 20 MG PO TABS
40.0000 mg | ORAL_TABLET | Freq: Every day | ORAL | Status: DC
  Administered 2022-10-03 – 2022-10-04 (×2): 40 mg via ORAL
  Filled 2022-10-02 (×2): qty 2

## 2022-10-02 NOTE — Assessment & Plan Note (Signed)
Likely prerenal from dehydration, poor PO intake --Stop IV fluids --Monitor BMP --Avoid nephrotoxins, renally dose meds

## 2022-10-02 NOTE — Assessment & Plan Note (Signed)
BP controlled ?-Continue metoprolol ?

## 2022-10-02 NOTE — ED Triage Notes (Signed)
Patient presents with cough, SOB, and intermittent chest pain. Reports having bronchial wash on Friday and since started coughing and now progressed into worsening symptoms

## 2022-10-02 NOTE — Assessment & Plan Note (Signed)
Continue amitriptyline 

## 2022-10-02 NOTE — Assessment & Plan Note (Addendum)
Possible pneumonia/pneumonitis in the setting of bronchoscopy on 6/7 Possible sepsis Patient presents with cough and wheezing, tachycardic and tachypneic in the ED with symptoms starting 2 days after BAL to evaluate for tracheomalacia BAL results so far: Acid-fast smear negative, no fungus observed, no organisms seen, no growth --Transition IV >> PO steroids, prednisone 40 mg daily - Continue DuoNebs  --Dulera (sub for home Symbicort) - Treated with Levaquin on admission. Procal is >0.10 - makes bacterial infection less likely - O2 per protocol, wean as tolerated, target spO2>90% - Antitussives, incentive spirometer, flutter valve -Pulmonology consulted to follow-up post-bronchoscopy (pt's clinic appt is tomorrow 6/13)

## 2022-10-02 NOTE — H&P (Signed)
History and Physical    Patient: Jenna Tyler ZOX:[id number] DOB: [birth year] DOA: 10/02/2022 DOS: the patient was seen and examined on 10/02/2022 PCP: Alm Bustard, NP  Patient coming from: Home  Chief Complaint:  Chief Complaint  Patient presents with   Cough   Chest Pain   Shortness of Breath    HPI: Jenna Tyler is a 57 y.o. 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  Her cough developed 2 days after the procedure and she became concerned for development of pneumonia which she has had several times in the past.  She denies fever or chills.  Denies chest pain. ED course and data review: Afebrile, but tachycardic to 126 and tachypneic to 22 with O2 sat in the low 90s on room air.  BP 156/92.  Labs significant for WBC of 12,000 with lactic acid 1.7.Creatinine 1.23 above baseline of 0.84.  Troponin less than 0.2.  EKG, personally viewed and interpreted, showing sinus tachycardia at 109 with no acute ST-T wave changes. Chest x-ray shows underinflation and mild left basilar atelectasis ED treatment: Patient treated with DuoNebs x 2 Solu-Medrol and Tussionex and given Levaquin due to penicillin allergy. Hospitalist consulted for admission for COPD exacerbation and possible pneumonia.   Review of Systems: As mentioned in the history of present illness. All other systems reviewed and are negative.  Past Medical History:  Diagnosis Date   Asthma    COPD (chronic obstructive pulmonary disease) (HCC)    DM (diabetes mellitus), type 2 (HCC)    Dyspnea    Fibromyalgia    Guillain Barr syndrome (HCC)    Headache    Hyperlipidemia    Hypertension    Hypothyroidism    Neurofibromatosis,  peripheral, NF1 (HCC) 2017   Pancreatitis    PONV (postoperative nausea and vomiting)    Pulmonary nodule    RLS (restless legs syndrome)    Sleep apnea    never went to pick up her cpap   Tachycardia    Tracheomalacia    Past Surgical History:  Procedure Laterality Date   ABDOMINAL HYSTERECTOMY     BREAST BIOPSY Right 2012   negative   BRONCHIAL WASHINGS N/A 09/28/2022   Procedure: BRONCHIAL WASHINGS;  Surgeon: Vida Rigger, MD;  Location: [facility] ORS;  Service: Thoracic;  Laterality: N/A;   CHOLECYSTECTOMY     FLEXIBLE BRONCHOSCOPY N/A 09/28/2022   Procedure: FLEXIBLE BRONCHOSCOPY;  Surgeon: Vida Rigger, MD;  Location: [facility] ORS;  Service: Thoracic;  Laterality: N/A;   FOOT SURGERY     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 Her smoking use included cigarettes. She has a 15.00 pack-year smoking history. She has never used smokeless tobacco. She reports that she does not currently use alcohol. She reports that she does not use drugs.  Allergies  Allergen Reactions   Nsaids  Other (See Comments)    Internal bleeding  Intestinal bleeding   Paxlovid [Nirmatrelvir-Ritonavir]    Tizanidine Other (See Comments)    Visual hallucinations   Adhesive [Tape] Rash   Aspirin Other (See Comments)    Cannot take due to ulcers.   Penicillins Rash    Has patient had a PCN reaction causing immediate rash, facial/tongue/throat swelling, SOB or lightheadedness with hypotension: Yes Has patient had a PCN reaction causing severe rash involving mucus membranes or skin necrosis: No Has patient  had a PCN reaction that required hospitalization: No Has patient had a PCN reaction occurring within the last 10 years: Yes If all of the above answers are "NO", then may proceed with Cephalosporin use.  Patient tolerated ANCEF administration     Family History  Problem Relation Age of Onset   Breast cancer Sister 43   Breast cancer Maternal Aunt 40   Breast cancer Maternal Grandmother 60   Breast cancer Maternal Aunt 40   Breast cancer Cousin 40       maternal side    Prior to Admission medications   Medication Sig Start Date End Date Taking? Authorizing Provider  albuterol (VENTOLIN HFA) 108 (90 Base) MCG/ACT inhaler Inhale 1-2 puffs into the lungs every 4 (four) hours as needed. 02/22/22   [provider]  amitriptyline (ELAVIL) 100 MG tablet Take 100 mg by mouth at bedtime.    [provider]  benzonatate (TESSALON PERLES) 100 MG capsule Take 2 capsules (200 mg total) by mouth 3 (three) times daily as needed for cough. 09/28/22 11/17/22  Vida Rigger, MD  butalbital-acetaminophen-caffeine (FIORICET) 50-325-40 MG tablet Take 1 tablet by mouth every 4 (four) hours as needed for headache. 03/26/22   Georga Hacking, MD  gabapentin (NEURONTIN) 800 MG tablet Take 800 mg by mouth 2 (two) times daily. 11/08/20   [provider]  levothyroxine (SYNTHROID) 50 MCG tablet Take 50 mcg by mouth daily before breakfast. 01/19/22   [provider]  methocarbamol (ROBAXIN) 750 MG tablet Take 750 mg by mouth 2 (two) times daily. 09/22/21   [provider]  metoprolol tartrate (LOPRESSOR) 25 MG tablet Take 25 mg by mouth every morning.    [provider]  rOPINIRole (REQUIP) 0.5 MG tablet Take 1 mg by mouth 2 (two) times daily.    [provider]  SYMBICORT 160-4.5 MCG/ACT inhaler Inhale 2 puffs into the lungs 2 (two) times daily. 05/25/20   [provider]    Physical Exam: Vitals:   10/02/22 1546 10/02/22 1548 10/02/22 1727  10/02/22 1900  BP: (!) 156/92  (!) 139/109 (!) 123/91  Pulse: (!) 126  (!) 127 (!) 104  Resp: (!) 22  20 (!) 21  Temp: 98.9 F (37.2 C)     TempSrc: Oral     SpO2: 100%  94%   Weight:  86.2 kg    Height:  5\' 5"  (1.651 m)     Physical Exam Vitals and nursing note reviewed.  Constitutional:      General: She is not in acute distress.    Comments: Conversational dyspnea, speaking in 1-2 with short sentences  HENT:     Head: Normocephalic and atraumatic.  Cardiovascular:     Rate and Rhythm: Regular rhythm. Tachycardia present.     Heart sounds: Normal heart sounds.  Pulmonary:     Effort: Tachypnea present.     Breath sounds: Wheezing present.  Abdominal:     Palpations: Abdomen is soft.  Tenderness: There is no abdominal tenderness.  Neurological:     Mental Status: Mental status is at baseline.     Labs on Admission: I have personally reviewed following labs and imaging studies  CBC: Recent Labs  Lab 10/02/22 1559  WBC 12.2*  HGB 15.7*  HCT 47.6*  MCV 86.4  PLT 256   Basic Metabolic Panel: Recent Labs  Lab 10/02/22 1559  NA 136  K 4.1  CL 102  CO2 23  GLUCOSE 112*  BUN 20  CREATININE 1.23*  CALCIUM 9.3   GFR: Estimated Creatinine Clearance: 55.4 mL/min (A) (by C-G formula based on SCr of 1.23 mg/dL (H)). Liver Function Tests: No results for input(s): "AST", "ALT", "ALKPHOS", "BILITOT", "PROT", "ALBUMIN" in the last 168 hours. No results for input(s): "LIPASE", "AMYLASE" in the last 168 hours. No results for input(s): "AMMONIA" in the last 168 hours. Coagulation Profile: No results for input(s): "INR", "PROTIME" in the last 168 hours. Cardiac Enzymes: No results for input(s): "CKTOTAL", "CKMB", "CKMBINDEX", "TROPONINI" in the last 168 hours. BNP (last 3 results) No results for input(s): "PROBNP" in the last 8760 hours. HbA1C: No results for input(s): "HGBA1C" in the last 72 hours. CBG: Recent Labs  Lab 09/28/22 1312  GLUCAP 96   Lipid  Profile: No results for input(s): "CHOL", "HDL", "LDLCALC", "TRIG", "CHOLHDL", "LDLDIRECT" in the last 72 hours. Thyroid Function Tests: No results for input(s): "TSH", "T4TOTAL", "FREET4", "T3FREE", "THYROIDAB" in the last 72 hours. Anemia Panel: No results for input(s): "VITAMINB12", "FOLATE", "FERRITIN", "TIBC", "IRON", "RETICCTPCT" in the last 72 hours. Urine analysis:    Component Value Date/Time   COLORURINE STRAW (A) 08/29/2021 2158   APPEARANCEUR CLEAR (A) 08/29/2021 2158   APPEARANCEUR Cloudy (A) 09/27/2020 1438   LABSPEC 1.015 08/29/2021 2158   LABSPEC 1.021 03/28/2014 1540   PHURINE 5.0 08/29/2021 2158   GLUCOSEU NEGATIVE 08/29/2021 2158   GLUCOSEU Negative 03/28/2014 1540   HGBUR NEGATIVE 08/29/2021 2158   BILIRUBINUR NEGATIVE 08/29/2021 2158   BILIRUBINUR Negative 09/27/2020 1438   BILIRUBINUR Negative 03/28/2014 1540   KETONESUR NEGATIVE 08/29/2021 2158   PROTEINUR NEGATIVE 08/29/2021 2158   NITRITE NEGATIVE 08/29/2021 2158   LEUKOCYTESUR NEGATIVE 08/29/2021 2158   LEUKOCYTESUR 1+ 03/28/2014 1540    Radiological Exams on Admission: DG Chest 2 View  Result Date: 10/02/2022 CLINICAL DATA:  Cough and chest pain EXAM: CHEST - 2 VIEW COMPARISON:  X-ray 09/28/2022 FINDINGS: Underinflation. Mild linear opacity left lung base likely scar or atelectasis. No pneumothorax, effusion or edema. Surgical clips in the right upper quadrant of the abdomen. Slight pectus excavatum on the lateral view. IMPRESSION: Underinflation.  Mild left basilar atelectasis. Electronically Signed   By: Karen Kays M.D.   On: 10/02/2022 17:33     Data Reviewed: Relevant notes from primary care and specialist visits, past discharge summaries as available in EHR, including Care Everywhere. Prior diagnostic testing as pertinent to current admission diagnoses Updated medications and problem lists for reconciliation ED course, including vitals, labs, imaging, treatment and response to treatment Triage  notes, nursing and pharmacy notes and ED provider's notes Notable results as noted in HPI   Assessment and Plan: COPD exacerbation (HCC) Possible pneumonia/pneumonitis in the setting of bronchoscopy on 6/7 Possible sepsis Patient presents with cough and wheezing, tachycardic and tachypneic in the ED with symptoms starting 2 days after BAL to evaluate for tracheomalacia BAL results so far: Acid-fast smear negative, no fungus observed, no organisms seen, no growth - Continue DuoNebs and Solu-Medrol - Continue Levaquin  pending procalcitonin to evaluate for likelihood of a bacterial infection - Sepsis fluids as patient meeting borderline criteria - Supplemental oxygen - Antitussives, incentive spirometer, flutter valve -Consider pulmonology consult for follow-up from bronchoscopy  AKI (acute kidney injury) (HCC) If sepsis ruled in and possibly related to sepsis otherwise likely prerenal from dehydration IV fluids Monitor renal function and avoid nephrotoxins  HTN (hypertension) BP controlled Continue metoprolol  Acquired hypothyroidism Continue levothyroxine  Chronic pain Continue multimodal pain meds with methocarbamol, gabapentin, Fioricet and amitriptyline  Neurofibromatosis, type 1 (HCC) No acute issues  Depression, major, recurrent, moderate (HCC) Continue amitriptyline     DVT prophylaxis: Lovenox  Consults: none  Advance Care Planning:   Code Status: Prior   Family Communication: none  Disposition Plan: Back to previous home environment  Severity of Illness: The appropriate patient status for this patient is INPATIENT. Inpatient status is judged to be reasonable and necessary in order to provide the required intensity of service to ensure the patient's safety. The patient's presenting symptoms, physical exam findings, and initial radiographic and laboratory data in the context of their chronic comorbidities is felt to place them at high risk for further clinical  deterioration. Furthermore, it is not anticipated that the patient will be medically stable for discharge from the hospital within 2 midnights of admission.   * I certify that at the point of admission it is my clinical judgment that the patient will require inpatient hospital care spanning beyond 2 midnights from the point of admission due to high intensity of service, high risk for further deterioration and high frequency of surveillance required.*  Author: Andris Baumann, MD 10/02/2022 7:24 PM  For on call review [email or web address].

## 2022-10-02 NOTE — Assessment & Plan Note (Signed)
Continue multimodal pain meds with methocarbamol, gabapentin, Fioricet and amitriptyline

## 2022-10-02 NOTE — Assessment & Plan Note (Signed)
Continue levothyroxine 

## 2022-10-02 NOTE — ED Provider Triage Note (Signed)
Emergency Medicine Provider Triage Evaluation Note  Anecia Nusbaum , a 57 y.o. female  was evaluated in triage.  Pt complains of cough, shortness of breath, and intermittent chest pain after bronchial wash 4 days ago.  Physical Exam  BP (!) 156/92 (BP Location: Right Arm)   Pulse (!) 126   Temp 98.9 F (37.2 C) (Oral)   Resp (!) 22   Ht 5\' 5"  (1.651 m)   Wt 86.2 kg   SpO2 100%   BMI 31.62 kg/m  Gen:   Awake, no distress   Resp:  Normal effort  MSK:   Moves extremities without difficulty  Other:   Medical Decision Making  Medically screening exam initiated at 4:00 PM.  Appropriate orders placed.  Garlan Fair was informed that the remainder of the evaluation will be completed by another provider, this initial triage assessment does not replace that evaluation, and the importance of remaining in the ED until their evaluation is complete.     Chinita Pester, FNP 10/02/22 1649

## 2022-10-02 NOTE — Assessment & Plan Note (Signed)
No acute issues.

## 2022-10-02 NOTE — ED Provider Notes (Signed)
[facility] Provider Note    Event Date/Time   First MD Initiated Contact with Patient 10/02/22 1640     (approximate)   History   Cough, Chest Pain, and Shortness of Breath   HPI  Jenna Tyler is a 57 y.o. female with a history of neurofibromatosis, Guillain-Barr, fibromyalgia, diabetes, COPD, tracheomalacia presents with complaints of cough, shortness of breath.  She reports she had a bronchoscopy performed 4 days ago, cough developed a day or so afterwards.  She thinks that she has pneumonia because her symptoms feel similar to prior multiple episodes of pneumonia.     Physical Exam   Triage Vital Signs: ED Triage Vitals  Enc Vitals Group     BP 10/02/22 1546 (!) 156/92     Pulse Rate 10/02/22 1546 (!) 126     Resp 10/02/22 1546 (!) 22     Temp 10/02/22 1546 98.9 F (37.2 C)     Temp Source 10/02/22 1546 Oral     SpO2 10/02/22 1546 100 %     Weight 10/02/22 1548 86.2 kg (190 lb)     Height 10/02/22 1548 1.651 m (5\' 5" )     Head Circumference --      Peak Flow --      Pain Score 10/02/22 1548 5     Pain Loc --      Pain Edu? --      Excl. in GC? --     Most recent vital signs: Vitals:   10/02/22 1727 10/02/22 1900  BP: (!) 139/109 (!) 123/91  Pulse: (!) 127 (!) 104  Resp: 20 (!) 21  Temp:    SpO2: 94%      General: Awake, frequent coughing CV:  Good peripheral perfusion.  Resp:  Tachypnea, scattered wheezes Abd:  No distention.  Other:  No leg swelling   ED Results / Procedures / Treatments   Labs (all labs ordered are listed, but only abnormal results are displayed) Labs Reviewed  BASIC METABOLIC PANEL - Abnormal; Notable for the following components:      Result Value   Glucose, Bld 112 (*)    Creatinine, Ser 1.23 (*)    GFR, Estimated 52 (*)    All other components within normal limits  CBC - Abnormal; Notable for the following components:   WBC 12.2 (*)    RBC 5.51 (*)    Hemoglobin 15.7 (*)    HCT 47.6  (*)    All other components within normal limits  LACTIC ACID, PLASMA  TROPONIN I (HIGH SENSITIVITY)     EKG  ED ECG REPORT I, Jene Every, the attending physician, personally viewed and interpreted this ECG.  Date: 10/02/2022  Rhythm: normal sinus rhythm QRS Axis: normal Intervals: normal ST/T Wave abnormalities: normal Narrative Interpretation: no evidence of acute ischemia    RADIOLOGY Chest x-ray viewed interpreted by me, questionable left-sided infiltrate    PROCEDURES:  Critical Care performed:   Procedures   MEDICATIONS ORDERED IN ED: Medications  levofloxacin (LEVAQUIN) IVPB 750 mg (has no administration in time range)  methylPREDNISolone sodium succinate (SOLU-MEDROL) 125 mg/2 mL injection 125 mg (125 mg Intravenous Given 10/02/22 1811)  ipratropium-albuterol (DUONEB) 0.5-2.5 (3) MG/3ML nebulizer solution 3 mL (3 mLs Nebulization Given 10/02/22 1809)  ipratropium-albuterol (DUONEB) 0.5-2.5 (3) MG/3ML nebulizer solution 3 mL (3 mLs Nebulization Given 10/02/22 1809)  chlorpheniramine-HYDROcodone (TUSSIONEX) 10-8 MG/5ML suspension 5 mL (5 mLs Oral Given 10/02/22 1808)     IMPRESSION / MDM /  ASSESSMENT AND PLAN / ED COURSE  I reviewed the triage vital signs and the nursing notes. Patient's presentation is most consistent with acute presentation with potential threat to life or bodily function.  Patient presents with cough, shortness of breath in the setting of COPD, tracheomalacia.  Had bronchoscopy 5 days ago, her pulmonologist is Dr. Meredeth Ide.   She is wheezing on exam consistent with COPD exacerbation, chest x-ray with atelectasis versus infiltrate in the left lower lobe, she is tachycardic and tachypneic as well.  Treated with IV Solu-Medrol, DuoNebs, Tussidex.  IV Levaquin because of penicillin allergy  She will require admission to the hospital given continued shortness of breath tachycardia tachypnea        FINAL CLINICAL IMPRESSION(S) / ED  DIAGNOSES   Final diagnoses:  Community acquired pneumonia of left lower lobe of lung     Rx / DC Orders   ED Discharge Orders     None        Note:  This document was prepared using Dragon voice recognition software and may include unintentional dictation errors.   Jene Every, MD 10/02/22 [id number]

## 2022-10-03 ENCOUNTER — Encounter: Payer: Medicaid Other | Admitting: Internal Medicine

## 2022-10-03 DIAGNOSIS — J441 Chronic obstructive pulmonary disease with (acute) exacerbation: Secondary | ICD-10-CM | POA: Diagnosis not present

## 2022-10-03 LAB — HIV ANTIBODY (ROUTINE TESTING W REFLEX): HIV Screen 4th Generation wRfx: NONREACTIVE

## 2022-10-03 LAB — BASIC METABOLIC PANEL
Anion gap: 10 (ref 5–15)
BUN: 20 mg/dL (ref 6–20)
CO2: 20 mmol/L — ABNORMAL LOW (ref 22–32)
Calcium: 8.8 mg/dL — ABNORMAL LOW (ref 8.9–10.3)
Chloride: 102 mmol/L (ref 98–111)
Creatinine, Ser: 1.03 mg/dL — ABNORMAL HIGH (ref 0.44–1.00)
GFR, Estimated: >60 mL/min (ref >60)
Glucose, Bld: 240 mg/dL — ABNORMAL HIGH (ref 70–99)
Potassium: 4.4 mmol/L (ref 3.5–5.1)
Sodium: 132 mmol/L — ABNORMAL LOW (ref 135–145)

## 2022-10-03 LAB — CBC
HCT: 41.3 % (ref 36.0–46.0)
Hemoglobin: 13.9 g/dL (ref 12.0–15.0)
MCH: 28.8 pg (ref 26.0–34.0)
MCHC: 33.7 g/dL (ref 30.0–36.0)
MCV: 85.5 fL (ref 80.0–100.0)
Platelets: 229 10*3/uL (ref 150–400)
RBC: 4.83 MIL/uL (ref 3.87–5.11)
RDW: 13.2 % (ref 11.5–15.5)
WBC: 10.1 10*3/uL (ref 4.0–10.5)
nRBC: 0 % (ref 0.0–0.2)

## 2022-10-03 LAB — CULTURE, BLOOD (ROUTINE X 2): Special Requests: ADEQUATE

## 2022-10-03 MED ORDER — GUAIFENESIN-CODEINE 100-10 MG/5ML PO SOLN
5.0000 mL | Freq: Four times a day (QID) | ORAL | Status: DC | PRN
  Administered 2022-10-03: 5 mL via ORAL
  Filled 2022-10-03 (×4): qty 5

## 2022-10-03 MED ORDER — TRAMADOL HCL 50 MG PO TABS: 50.0000 mg | ORAL_TABLET | Freq: Four times a day (QID) | ORAL | Status: DC | PRN

## 2022-10-03 MED ORDER — METHOCARBAMOL 500 MG PO TABS
750.0000 mg | ORAL_TABLET | Freq: Two times a day (BID) | ORAL | Status: DC
  Administered 2022-10-03 – 2022-10-04 (×2): 750 mg via ORAL
  Filled 2022-10-03 (×2): qty 2

## 2022-10-03 MED ORDER — BUTALBITAL-APAP-CAFFEINE 50-325-40 MG PO TABS
1.0000 | ORAL_TABLET | Freq: Four times a day (QID) | ORAL | Status: DC | PRN
  Administered 2022-10-03: 1 via ORAL
  Filled 2022-10-03: qty 1

## 2022-10-03 MED ORDER — MOMETASONE FURO-FORMOTEROL FUM 200-5 MCG/ACT IN AERO
2.0000 | INHALATION_SPRAY | Freq: Two times a day (BID) | RESPIRATORY_TRACT | Status: DC
  Administered 2022-10-03 – 2022-10-04 (×2): 2 via RESPIRATORY_TRACT
  Filled 2022-10-03: qty 8.8

## 2022-10-03 MED ORDER — ORAL CARE MOUTH RINSE: 15.0000 mL | OROMUCOSAL | Status: DC | PRN

## 2022-10-03 MED ORDER — ADULT MULTIVITAMIN W/MINERALS CH
1.0000 | ORAL_TABLET | Freq: Every day | ORAL | Status: DC
  Administered 2022-10-03 – 2022-10-04 (×2): 1 via ORAL
  Filled 2022-10-03 (×2): qty 1

## 2022-10-03 MED ORDER — IPRATROPIUM-ALBUTEROL 0.5-2.5 (3) MG/3ML IN SOLN
3.0000 mL | Freq: Three times a day (TID) | RESPIRATORY_TRACT | Status: DC
  Administered 2022-10-03 – 2022-10-04 (×3): 3 mL via RESPIRATORY_TRACT
  Filled 2022-10-03 (×3): qty 3

## 2022-10-03 NOTE — Progress Notes (Signed)
Nutrition Brief Note  Patient identified on the Malnutrition Screening Tool (MST) Report  Wt Readings from Last 15 Encounters:  10/02/22 86.2 kg  09/28/22 89.8 kg  09/19/22 90 kg  09/11/22 89.7 kg  07/11/22 87.2 kg  07/06/22 89.7 kg  04/09/22 89.8 kg  03/26/22 89.8 kg  10/04/21 84.1 kg  08/29/21 83.5 kg  06/01/21 86.2 kg  01/17/21 88.5 kg  12/07/20 88 kg  11/24/20 88 kg  10/01/20 88.5 kg   Pt with medical history significant for asthma/COPD , dm, fibromyalgia, GBS, HTN, hypothyroidism, neurofibromatosis 1, RLS, OSA, tracheomalacia who is s/p elective bronchoscopy on 6/7 with BAL, indicated for evaluation for possible tracheobronchomalacia who presents for evaluation of cough and shortness of breath.   Pt admitted with COPD exacerbation.   6/7- s/p bronch- awaiting cultures and imaging for confirmation for tracheobronchomalacia   Reviewed I/O's: +320 ml x 24 hours  Spoke with pt at bedside, who was pleasant and in good spirits today. She reports feeling better today and respiratory status has improved. Pt reports good appetite, consumed 100% of breakfast this morning (pt shares she was :starving as she only ate a [place] sandwich in the ED yesterday). Pt reports she usually consumes 2 meals per day (Lunch: sandwich, Dinner: meat, starch, and vegetable). Pt reports that she has been consuming more vegetables, as her roommate is vegan and cooks often.   Pt denies any weight loss. She shares that she has been a little more weak secondary to respiratory status.   Discussed importance of good meal completions to promote healing. RD will liberalize diet.   Nutrition-Focused physical exam completed. Findings are no fat depletion, no muscle depletion, and no edema.    Labs reviewed: Na: 132.   Body mass index is 31.62 kg/m. Patient meets criteria for obesity, class II based on current BMI. Obesity is a complex, chronic medical condition that is optimally managed by a multidisciplinary  care team. Weight loss is not an ideal goal for an acute inpatient hospitalization. However, if further work-up for obesity is warranted, consider outpatient referral to [facility].    Current diet order is regular, patient is consuming approximately 100% of meals at this time. Labs and medications reviewed.   No nutrition interventions warranted at this time. If nutrition issues arise, please consult RD.   Levada Schilling, RD, LDN, CDCES Registered Dietitian II Certified Diabetes Care and Education Specialist Please refer to [facility] for RD and/or RD on-call/weekend/after hours pager

## 2022-10-03 NOTE — Progress Notes (Addendum)
Progress Note   Patient: Jenna Tyler UJW:[id number] DOB: 1965-06-25 DOA: 10/02/2022     1 DOS: the patient was seen and examined on 10/03/2022   Brief hospital course: HPI on admission 10/02/22 by Dr. Para March: "Lareina Espino is a 57 y.o. 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  Her cough developed 2 days after the procedure and she became concerned for development of pneumonia which she has had several times in the past.  She denies fever or chills.  Denies chest pain. ED course and data review: Afebrile, but tachycardic to 126 and tachypneic to 22 with O2 sat in the low 90s on room air.  BP 156/92.  Labs significant for WBC of 12,000 with lactic acid 1.7.Creatinine 1.23 above baseline of 0.84.  Troponin less than 0.2.  EKG, personally viewed and interpreted, showing sinus tachycardia at 109 with no acute ST-T wave changes. Chest x-ray shows underinflation and mild left basilar atelectasis ED treatment: Patient treated with DuoNebs x 2 Solu-Medrol and Tussionex and given Levaquin due to penicillin allergy. Hospitalist consulted for admission for COPD exacerbation and possible pneumonia. "   Pt was admitted to hospital and continued on antibiotics and steroids.  Pulmonology consulted, given pt had recently undergone bronchoscopy with some BAL results still pending.  Assessment and Plan: * COPD with acute exacerbation (HCC) .  COPD exacerbation (HCC) Possible pneumonia/pneumonitis in the setting of bronchoscopy on 6/7 Possible sepsis Patient presents with cough and wheezing, tachycardic and tachypneic in the ED with symptoms starting 2 days after BAL to evaluate for tracheomalacia BAL results so  far: Acid-fast smear negative, no fungus observed, no organisms seen, no growth --Transition IV >> PO steroids, prednisone 40 mg daily - Continue DuoNebs  --Dulera (sub for home Symbicort) - Treated with Levaquin on admission. Procal is >0.10 - makes bacterial infection less likely - O2 per protocol, wean as tolerated, target spO2>90% - Antitussives, incentive spirometer, flutter valve -Pulmonology consulted to follow-up post-bronchoscopy (pt's clinic appt is tomorrow 6/13)  AKI (acute kidney injury) (HCC) Likely prerenal from dehydration, poor PO intake --Stop IV fluids --Monitor BMP --Avoid nephrotoxins, renally dose meds  HTN (hypertension) BP controlled --Continue metoprolol  Acquired hypothyroidism --Continue levothyroxine  Chronic pain Continue multimodal pain meds with methocarbamol, gabapentin, Fioricet and amitriptyline  Neurofibromatosis, type 1 (HCC) No acute issues  Depression, major, recurrent, moderate (HCC) --Continue amitriptyline        Subjective: Pt awake sitting up in bed today. She reports a mostly non-productive cough.  She had a headache this AM that is feeling better after getting Fioricet.  States she cancelled tomorrow's appt with Dr Meredeth Ide.  She denies other acute complaints.  Hopes to be home to attend a meeting she leads tomorrow evening.    Physical Exam: Vitals:   10/03/22 0531 10/03/22 0731 10/03/22 0834 10/03/22 1413  BP: (!) 108/58  109/61   Pulse: 87  88   Resp: 18  16   Temp:   97.9 F (36.6 C)   TempSrc:      SpO2: 99% 99% 93% 96%  Weight:      Height:       General exam: awake, alert, no acute distress HEENT: atraumatic, clear conjunctiva, anicteric sclera, moist mucus membranes, hearing grossly normal  Respiratory system: CTAB diminished bases, no wheezes, rales or rhonchi, normal respiratory effort. Cardiovascular system: normal S1/S2, RRR, no JVD, murmurs, rubs, gallops, no pedal edema.   Gastrointestinal system: soft,  NT, ND, no HSM felt, +bowel sounds. Central nervous system: A&O x 4. no gross focal neurologic deficits, normal speech Extremities: moves all , no edema, normal tone Skin: dry, intact, normal temperature Psychiatry: normal mood, congruent affect, judgement and insight appear normal   Data Reviewed:  Notable labs --- Na 132, bicarb 20, glucose 240, Cr 1.23 >> 1.03 improved, Ca 8.8.  Procal < 0.10.  Normal CBC.  WBC normalized 12.2 >> 10.1k  Family Communication: None. Pt is able to update.  Disposition: Status is: Inpatient Remains inpatient appropriate because: remains on IV antibiotics pending further improvement, pulmonology consult pending   Planned Discharge Destination: Home    Time spent: 42 minutes  Author: Pennie Banter, DO 10/03/2022 3:27 PM  For on call review [email or web address].

## 2022-10-03 NOTE — Progress Notes (Signed)
PT Cancellation Note  Patient Details Name: Jenna Tyler MRN: [id number] DOB: November 12, 1965   Cancelled Treatment:    Reason Eval/Treat Not Completed: PT screened, no needs identified, will sign off. Per OT, patient is independent. No skilled needs at this time.    [name] 10/03/2022, 11:18 AM

## 2022-10-03 NOTE — Evaluation (Signed)
Occupational Therapy Evaluation Patient Details Name: Jenna Tyler MRN: [id number] DOB: 20-Apr-1966 Today's Date: 10/03/2022   History of Present Illness Jenna Tyler is a 57 y.o. 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  Her cough developed 2 days after the procedure and she became concerned for development of pneumonia which she has had several times in the past.  She denies fever or chills.  Denies chest pain. Hospitalist consulted for admission for COPD exacerbation and possible pneumonia.   Clinical Impression   Jenna Tyler presents with a mild cough, reports that she feels much better than yesterday, when she was "coughing nonstop." She is no longer on supplemental O2, and her O2 sats remain in upper 90s with mobility. Pt is at or close to her baseline level of functional mobility, able to perform BADL easily and smoothly without assistance. She denies pain, denies falls history, ambulates PRN with a SPC. Pt reports she does get tired easily, especially with showering. Provided educ re: ECS, w/ pt verbalizing understanding. Pt and therapist in agreement that no additional OT services are required at this time.    Recommendations for follow up therapy are one component of a multi-disciplinary discharge planning process, led by the attending physician.  Recommendations may be updated based on patient status, additional functional criteria and insurance authorization.   Assistance Recommended at Discharge    Patient can return home with the following Assist for transportation    Functional Status Assessment     Equipment Recommendations  None recommended by OT    Recommendations for Other  Services       Precautions / Restrictions Precautions Precautions: None Restrictions Weight Bearing Restrictions: No      Mobility Bed Mobility Overal bed mobility: Independent                  Transfers Overall transfer level: Independent                        Balance Overall balance assessment: Modified Independent                                         ADL either performed or assessed with clinical judgement   ADL Overall ADL's : Independent                                       General ADL Comments: Pt able to perform bed mobility, transfers, UB and LB dressing, grooming, toileting all [facility]     Vision         Perception     Praxis      Pertinent Vitals/Pain Pain Assessment Pain Assessment: No/denies pain     Hand Dominance Right   Extremity/Trunk Assessment Upper Extremity Assessment Upper Extremity Assessment: Overall WFL for tasks assessed   Lower Extremity Assessment Lower Extremity Assessment: Overall WFL for tasks assessed   Cervical / Trunk Assessment Cervical / Trunk Assessment: Normal   Communication Communication Communication: No difficulties   Cognition Arousal/Alertness: Awake/alert Behavior During Therapy: WFL for tasks assessed/performed Overall Cognitive  Status: Within Functional Limits for tasks assessed                                       General Comments       Exercises Other Exercises Other Exercises: Educ re: ECS   Shoulder Instructions      Home Living Family/patient expects to be discharged to:: Private residence Living Arrangements: Non-relatives/Friends Available Help at Discharge: Family;Friend(s);Available PRN/intermittently Type of Home: Mobile home Home Access: Stairs to enter Entrance Stairs-Number of Steps: 3   Home Layout: One level     Bathroom Shower/Tub: Chief Strategy Officer: Standard     Home Equipment:  [employer] - single point          Prior Functioning/Environment Prior Level of Function : Independent/Modified Independent                        OT Problem List: Decreased strength;Decreased activity tolerance      OT Treatment/Interventions:      OT Goals(Current goals can be found in the care plan section) Acute Rehab OT Goals Patient Stated Goal: to be able to go to her standing Thursday evening meeting OT Goal Formulation: With patient Time For Goal Achievement: 10/17/22 Potential to Achieve Goals: Good  OT Frequency:      Co-evaluation              AM-PAC OT "6 Clicks" Daily Activity     Outcome Measure Help from another person eating meals?: None Help from another person taking care of personal grooming?: None Help from another person toileting, which includes using toliet, bedpan, or urinal?: None Help from another person bathing (including washing, rinsing, drying)?: None Help from another person to put on and taking off regular upper body clothing?: None Help from another person to put on and taking off regular lower body clothing?: None 6 Click Score: 24   End of Session    Activity Tolerance: Patient tolerated treatment well Patient left: in chair  OT Visit Diagnosis: Muscle weakness (generalized) (M62.81)                Time: 1610-9604 OT Time Calculation (min): 19 min Charges:  OT General Charges $OT Visit: 1 Visit OT Evaluation $OT Eval Low Complexity: 1 Low OT Treatments $Self Care/Home Management : 8-22 mins Latina Craver, PhD, MS, OTR/L 10/03/22, 10:02 AM

## 2022-10-03 NOTE — Progress Notes (Signed)
PULMONOLOGY         Date: 10/03/2022,   MRN# [id number] Jenna Tyler Jul 29, 1965     AdmissionWeight: 86.2 kg                 CurrentWeight: 86.2 kg  Referring provider: Dr Meredeth Ide    CHIEF COMPLAINT:   Chronic cough   HISTORY OF PRESENT ILLNESS   This is a 57 yo asthma/COPD , dm, fibromyalgia, GBS, HTN, hypothyroidism, NF1, RLS, OSA, tracheomalacia who came in due to cough. She shares she feels that she may have COPD exacerbation. During evaluation she coughs.  She requires narcotics via pills and syrup. She can speak in full sentences. She does not produce or expectorate any mucus or phlegm.  She is on room air and saturates 99%.  Her chest x ray does not show pneumonia or edema but may have atelectasis.   She did have bronchoscopy and had which showed tracheobronchomalacia.    PAST MEDICAL HISTORY   Past Medical History:  Diagnosis Date   Asthma    COPD (chronic obstructive pulmonary disease) (HCC)    DM (diabetes mellitus), type 2 (HCC)    Dyspnea    Fibromyalgia    Guillain Barr syndrome (HCC)    Headache    Hyperlipidemia    Hypertension    Hypothyroidism    Neurofibromatosis, peripheral, NF1 (HCC) 2017   Pancreatitis    PONV (postoperative nausea and vomiting)    Pulmonary nodule    RLS (restless legs syndrome)    Sleep apnea    never went to pick up her cpap   Tachycardia    Tracheomalacia      SURGICAL HISTORY   Past Surgical History:  Procedure Laterality Date   ABDOMINAL HYSTERECTOMY     BREAST BIOPSY Right 2012   negative   BRONCHIAL WASHINGS N/A 09/28/2022   Procedure: BRONCHIAL WASHINGS;  Surgeon: Vida Rigger, MD;  Location: [facility];  Service: Thoracic;  Laterality: N/A;   CHOLECYSTECTOMY     FLEXIBLE BRONCHOSCOPY N/A 09/28/2022   Procedure: FLEXIBLE BRONCHOSCOPY;  Surgeon: Vida Rigger, MD;  Location: [facility];  Service: Thoracic;  Laterality: N/A;   FOOT SURGERY     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   OTHER  SURGICAL HISTORY     excision of lipoma   OTHER SURGICAL HISTORY     Abdominal surgery   THYROID SURGERY     thyroidectomy   XI ROBOTIC LAPAROSCOPIC ASSISTED APPENDECTOMY N/A 04/08/2020   Procedure: XI ROBOTIC LAPAROSCOPIC ASSISTED APPENDECTOMY;  Surgeon: Sung Amabile, DO;  Location: [facility];  Service: General;  Laterality: N/A;     FAMILY HISTORY   Family History  Problem Relation Age of Onset   Breast cancer Sister 7   Breast cancer Maternal Aunt 40   Breast cancer Maternal Grandmother 60   Breast cancer Maternal Aunt 40   Breast cancer Cousin 40       maternal side     SOCIAL HISTORY   Social History   Tobacco Use   Smoking status: Former    Packs/day: 1.00    Years: 15.00    Additional pack years: 0.00    Total pack years: 15.00    Types: Cigarettes    Quit date: 12/28/1997    Years since quitting: 24.7   Smokeless tobacco: Never  Vaping Use   Vaping Use: Never used  Substance Use Topics   Alcohol use: Not Currently    Alcohol/week: 0.0 standard drinks of  alcohol   Drug use: No     MEDICATIONS    Home Medication:    Current Medication:  Current Facility-Administered Medications:    acetaminophen (TYLENOL) tablet 650 mg, 650 mg, Oral, Q6H PRN, 650 mg at 10/03/22 0803 **OR** acetaminophen (TYLENOL) suppository 650 mg, 650 mg, Rectal, Q6H PRN, Andris Baumann, MD   albuterol (PROVENTIL) (2.5 MG/3ML) 0.083% nebulizer solution 2.5 mg, 2.5 mg, Nebulization, Q2H PRN, Andris Baumann, MD   amitriptyline (ELAVIL) tablet 100 mg, 100 mg, Oral, QHS, Lindajo Royal V, MD, 100 mg at 10/03/22 0004   azithromycin (ZITHROMAX) 500 mg in sodium chloride 0.9 % 250 mL IVPB, 500 mg, Intravenous, Q24H, Angelique Blonder, RPH   benzonatate (TESSALON) capsule 200 mg, 200 mg, Oral, TID PRN, Andris Baumann, MD   butalbital-acetaminophen-caffeine (FIORICET) [phone] MG per tablet 1 tablet, 1 tablet, Oral, Q6H PRN, Esaw Grandchild A, DO, 1 tablet at 10/03/22 1228   cefTRIAXone  (ROCEPHIN) 2 g in sodium chloride 0.9 % 100 mL IVPB, 2 g, Intravenous, Q24H, Merrill, Kristin A, RPH   chlorpheniramine-HYDROcodone (TUSSIONEX) 10-8 MG/5ML suspension 5 mL, 5 mL, Oral, Q12H, Andris Baumann, MD, 5 mL at 10/03/22 0803   enoxaparin (LOVENOX) injection 40 mg, 40 mg, Subcutaneous, Q24H, Lindajo Royal V, MD, 40 mg at 10/02/22 2304   gabapentin (NEURONTIN) capsule 800 mg, 800 mg, Oral, BID, Andris Baumann, MD, 800 mg at 10/03/22 1478   HYDROcodone-acetaminophen (NORCO/VICODIN) 5-325 MG per tablet 1-2 tablet, 1-2 tablet, Oral, Q4H PRN, Andris Baumann, MD, 2 tablet at 10/03/22 1512   ipratropium-albuterol (DUONEB) 0.5-2.5 (3) MG/3ML nebulizer solution 3 mL, 3 mL, Nebulization, TID, Esaw Grandchild A, DO, 3 mL at 10/03/22 1413   metoprolol tartrate (LOPRESSOR) tablet 25 mg, 25 mg, Oral, BH-q7a, Duncan, Jerrye Beavers V, MD, 25 mg at 10/03/22 0803   multivitamin with minerals tablet 1 tablet, 1 tablet, Oral, Daily, Esaw Grandchild A, DO, 1 tablet at 10/03/22 1128   ondansetron (ZOFRAN) tablet 4 mg, 4 mg, Oral, Q6H PRN **OR** ondansetron (ZOFRAN) injection 4 mg, 4 mg, Intravenous, Q6H PRN, Andris Baumann, MD   Oral care mouth rinse, 15 mL, Mouth Rinse, PRN, Esaw Grandchild A, DO   [COMPLETED] methylPREDNISolone sodium succinate (SOLU-MEDROL) 40 mg/mL injection 40 mg, 40 mg, Intravenous, Q12H, 40 mg at 10/03/22 0803 **FOLLOWED BY** predniSONE (DELTASONE) tablet 40 mg, 40 mg, Oral, Q breakfast, Andris Baumann, MD   rOPINIRole (REQUIP) tablet 1 mg, 1 mg, Oral, BID, Andris Baumann, MD, 1 mg at 10/03/22 0803    ALLERGIES   Nsaids, Paxlovid [nirmatrelvir-ritonavir], Tizanidine, Adhesive [tape], Aspirin, and Penicillins     REVIEW OF SYSTEMS    Review of Systems:  Gen:  Denies  fever, sweats, chills weigh loss  HEENT: Denies blurred vision, double vision, ear pain, eye pain, hearing loss, nose bleeds, sore throat Cardiac:  No dizziness, chest pain or heaviness, chest tightness,edema Resp:    reports dyspnea chronically  Gi: Denies swallowing difficulty, stomach pain, nausea or vomiting, diarrhea, constipation, bowel incontinence Gu:  Denies bladder incontinence, burning urine Ext:   Denies Joint pain, stiffness or swelling Skin: Denies  skin rash, easy bruising or bleeding or hives Endoc:  Denies polyuria, polydipsia , polyphagia or weight change Psych:   Denies depression, insomnia or hallucinations   Other:  All other systems negative   VS: BP 109/61   Pulse 88   Temp 97.9 F (36.6 C)   Resp 16   Ht 5\' 5"  (1.651 m)  Wt 86.2 kg   SpO2 96%   BMI 31.62 kg/m      PHYSICAL EXAM    GENERAL:NAD, no fevers, chills, no weakness no fatigue HEAD: Normocephalic, atraumatic.  EYES: Pupils equal, round, reactive to light. Extraocular muscles intact. No scleral icterus.  MOUTH: Moist mucosal membrane. Dentition intact. No abscess noted.  EAR, NOSE, THROAT: Clear without exudates. No external lesions.  NECK: Supple. No thyromegaly. No nodules. No JVD.  PULMONARY: decreased breath sounds with mild rhonchi worse at bases bilaterally.  CARDIOVASCULAR: S1 and S2. Regular rate and rhythm. No murmurs, rubs, or gallops. No edema. Pedal pulses 2+ bilaterally.  GASTROINTESTINAL: Soft, nontender, nondistended. No masses. Positive bowel sounds. No hepatosplenomegaly.  MUSCULOSKELETAL: No swelling, clubbing, or edema. Range of motion full in all extremities.  NEUROLOGIC: Cranial nerves II through XII are intact. No gross focal neurological deficits. Sensation intact. Reflexes intact.  SKIN: No ulceration, lesions, rashes, or cyanosis. Skin warm and dry. Turgor intact.  PSYCHIATRIC: Mood, affect within normal limits. The patient is awake, alert and oriented x 3. Insight, judgment intact.       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   Patient/Family are satisfied with care plan and all questions have been answered.    Provider disclosure: Patient with at least one acute or chronic illness or injury that poses a threat to life or bodily function and is being managed actively during this encounter.  All of the below services have been performed independently by signing provider:  review of prior documentation from internal and or external health records.  Review of previous and current lab results.  Interview and comprehensive assessment during patient visit today. Review of current and previous chest radiographs/CT scans. Discussion of management and test interpretation with health care team and patient/family.   This document was prepared using Dragon voice recognition software and may include unintentional dictation errors.     Vida Rigger, M.D.  Division of Pulmonary & Critical Care Medicine

## 2022-10-03 NOTE — Hospital Course (Signed)
HPI on admission 10/02/22 by Dr. Para March: "Jenna Tyler is a 56 y.o. 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  Her cough developed 2 days after the procedure and she became concerned for development of pneumonia which she has had several times in the past.  She denies fever or chills.  Denies chest pain. ED course and data review: Afebrile, but tachycardic to 126 and tachypneic to 22 with O2 sat in the low 90s on room air.  BP 156/92.  Labs significant for WBC of 12,000 with lactic acid 1.7.Creatinine 1.23 above baseline of 0.84.  Troponin less than 0.2.  EKG, personally viewed and interpreted, showing sinus tachycardia at 109 with no acute ST-T wave changes. Chest x-ray shows underinflation and mild left basilar atelectasis ED treatment: Patient treated with DuoNebs x 2 Solu-Medrol and Tussionex and given Levaquin due to penicillin allergy. Hospitalist consulted for admission for COPD exacerbation and possible pneumonia. "   Pt was admitted to hospital and continued on antibiotics and steroids.  Pulmonology consulted, given pt had recently undergone bronchoscopy with some BAL results still pending.

## 2022-10-04 DIAGNOSIS — J441 Chronic obstructive pulmonary disease with (acute) exacerbation: Secondary | ICD-10-CM | POA: Diagnosis not present

## 2022-10-04 LAB — BASIC METABOLIC PANEL
Anion gap: 9 (ref 5–15)
BUN: 16 mg/dL (ref 6–20)
CO2: 23 mmol/L (ref 22–32)
Calcium: 9.2 mg/dL (ref 8.9–10.3)
Chloride: 106 mmol/L (ref 98–111)
Creatinine, Ser: 0.87 mg/dL (ref 0.44–1.00)
GFR, Estimated: >60 mL/min (ref >60)
Glucose, Bld: 177 mg/dL — ABNORMAL HIGH (ref 70–99)
Potassium: 4.5 mmol/L (ref 3.5–5.1)
Sodium: 138 mmol/L (ref 135–145)

## 2022-10-04 LAB — MAGNESIUM: Magnesium: 2.4 mg/dL (ref 1.7–2.4)

## 2022-10-04 MED ORDER — ADULT MULTIVITAMIN W/MINERALS CH: 1.0000 | ORAL_TABLET | Freq: Every day | ORAL | Status: DC

## 2022-10-04 MED ORDER — HYDROCODONE-ACETAMINOPHEN 5-325 MG PO TABS: 1.0000 | ORAL_TABLET | ORAL | 0 refills | Status: AC | PRN

## 2022-10-04 MED ORDER — AZITHROMYCIN 500 MG PO TABS: 500.0000 mg | ORAL_TABLET | Freq: Every day | ORAL | 0 refills | Status: AC

## 2022-10-04 MED ORDER — PREDNISONE 10 MG PO TABS: ORAL_TABLET | ORAL | 0 refills | Status: AC

## 2022-10-04 MED ORDER — CEFADROXIL 500 MG PO CAPS: 500.0000 mg | ORAL_CAPSULE | Freq: Two times a day (BID) | ORAL | 0 refills | Status: AC

## 2022-10-04 MED ORDER — HYDROCOD POLI-CHLORPHE POLI ER 10-8 MG/5ML PO SUER: 5.0000 mL | Freq: Two times a day (BID) | ORAL | 0 refills | Status: DC

## 2022-10-04 NOTE — Progress Notes (Signed)
Mobility Specialist - Progress Note   10/04/22 0844  Mobility  Activity Ambulated independently in hallway;Stood at bedside;Dangled on edge of bed  Level of Assistance Independent  Assistive Device None  Distance Ambulated (ft) 150 ft  Activity Response Tolerated well  Mobility Referral Yes  $Mobility charge 1 Mobility  Mobility Specialist Start Time (ACUTE ONLY) 0752  Mobility Specialist Stop Time (ACUTE ONLY) 0801  Mobility Specialist Time Calculation (min) (ACUTE ONLY) 9 min   Pt supine in bed on RA upon arrival. Pt completes bed mobility, dons socks, STS, and ambulates in hallway indep. Pt returns to bed with needs in reach.   Terrilyn Saver  Mobility Specialist  10/04/22 8:45 AM

## 2022-10-04 NOTE — Progress Notes (Signed)
PULMONOLOGY         Date: 10/04/2022,   MRN# [id number] Jenna Tyler [birth year]     AdmissionWeight: 86.2 kg                 CurrentWeight: 86.2 kg  Referring provider: Dr Meredeth Ide    CHIEF COMPLAINT:   Chronic cough   HISTORY OF PRESENT ILLNESS   This is a 57 yo asthma/COPD , dm, fibromyalgia, GBS, HTN, hypothyroidism, NF1, RLS, OSA, tracheomalacia who came in due to cough. She shares she feels that she may have COPD exacerbation. During evaluation she coughs.  She requires narcotics via pills and syrup. She can speak in full sentences. She does not produce or expectorate any mucus or phlegm.  She is on room air and saturates 99%.  Her chest x ray does not show pneumonia or edema but may have atelectasis.   She did have bronchoscopy and had which showed tracheobronchomalacia.   10/04/22- patient is cleared from pulmonary for dc home.   PAST MEDICAL HISTORY   Past Medical History:  Diagnosis Date   Asthma    COPD (chronic obstructive pulmonary disease) (HCC)    DM (diabetes mellitus), type 2 (HCC)    Dyspnea    Fibromyalgia    Guillain Barr syndrome (HCC)    Headache    Hyperlipidemia    Hypertension    Hypothyroidism    Neurofibromatosis, peripheral, NF1 (HCC) 2017   Pancreatitis    PONV (postoperative nausea and vomiting)    Pulmonary nodule    RLS (restless legs syndrome)    Sleep apnea    never went to pick up her cpap   Tachycardia    Tracheomalacia      SURGICAL HISTORY   Past Surgical History:  Procedure Laterality Date   ABDOMINAL HYSTERECTOMY     BREAST BIOPSY Right 2012   negative   BRONCHIAL WASHINGS N/A 09/28/2022   Procedure: BRONCHIAL WASHINGS;  Surgeon: Vida Rigger, MD;  Location: [facility];  Service: Thoracic;  Laterality: N/A;   CHOLECYSTECTOMY     FLEXIBLE BRONCHOSCOPY N/A 09/28/2022   Procedure: FLEXIBLE BRONCHOSCOPY;  Surgeon: Vida Rigger, MD;  Location: [facility];  Service: Thoracic;  Laterality: N/A;   FOOT  SURGERY     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   OTHER SURGICAL HISTORY     excision of lipoma   OTHER SURGICAL HISTORY     Abdominal surgery   THYROID SURGERY     thyroidectomy   XI ROBOTIC LAPAROSCOPIC ASSISTED APPENDECTOMY N/A 04/08/2020   Procedure: XI ROBOTIC LAPAROSCOPIC ASSISTED APPENDECTOMY;  Surgeon: Sung Amabile, DO;  Location: [facility];  Service: General;  Laterality: N/A;     FAMILY HISTORY   Family History  Problem Relation Age of Onset   Breast cancer Sister 44   Breast cancer Maternal Aunt 40   Breast cancer Maternal Grandmother 60   Breast cancer Maternal Aunt 40   Breast cancer Cousin 40       maternal side     SOCIAL HISTORY   Social History   Tobacco Use   Smoking status: Former    Packs/day: 1.00    Years: 15.00    Additional pack years: 0.00    Total pack years: 15.00    Types: Cigarettes    Quit date: 12/28/1997    Years since quitting: 24.7   Smokeless tobacco: Never  Vaping Use   Vaping Use: Never used  Substance Use Topics   Alcohol use:  Not Currently    Alcohol/week: 0.0 standard drinks of alcohol   Drug use: No     MEDICATIONS    Home Medication:    Current Medication:  Current Facility-Administered Medications:    acetaminophen (TYLENOL) tablet 650 mg, 650 mg, Oral, Q6H PRN, 650 mg at 10/03/22 1742 **OR** acetaminophen (TYLENOL) suppository 650 mg, 650 mg, Rectal, Q6H PRN, Andris Baumann, MD   albuterol (PROVENTIL) (2.5 MG/3ML) 0.083% nebulizer solution 2.5 mg, 2.5 mg, Nebulization, Q2H PRN, Andris Baumann, MD   amitriptyline (ELAVIL) tablet 100 mg, 100 mg, Oral, QHS, Andris Baumann, MD, 100 mg at 10/03/22 2158   azithromycin (ZITHROMAX) 500 mg in sodium chloride 0.9 % 250 mL IVPB, 500 mg, Intravenous, Q24H, Angelique Blonder, RPH, Last Rate: 250 mL/hr at 10/03/22 2018, 500 mg at 10/03/22 2018   benzonatate (TESSALON) capsule 200 mg, 200 mg, Oral, TID PRN, Andris Baumann, MD   butalbital-acetaminophen-caffeine (FIORICET)  [phone] MG per tablet 1 tablet, 1 tablet, Oral, Q6H PRN, Esaw Grandchild A, DO, 1 tablet at 10/03/22 1228   cefTRIAXone (ROCEPHIN) 2 g in sodium chloride 0.9 % 100 mL IVPB, 2 g, Intravenous, Q24H, Angelique Blonder, RPH, Stopped at 10/03/22 1821   chlorpheniramine-HYDROcodone (TUSSIONEX) 10-8 MG/5ML suspension 5 mL, 5 mL, Oral, Q12H, Andris Baumann, MD, 5 mL at 10/03/22 2159   enoxaparin (LOVENOX) injection 40 mg, 40 mg, Subcutaneous, Q24H, Andris Baumann, MD, 40 mg at 10/03/22 2158   gabapentin (NEURONTIN) capsule 800 mg, 800 mg, Oral, BID, Lindajo Royal V, MD, 800 mg at 10/03/22 2158   guaiFENesin-codeine 100-10 MG/5ML solution 5 mL, 5 mL, Oral, Q6H PRN, Esaw Grandchild A, DO, 5 mL at 10/03/22 1614   HYDROcodone-acetaminophen (NORCO/VICODIN) 5-325 MG per tablet 1-2 tablet, 1-2 tablet, Oral, Q4H PRN, Andris Baumann, MD, 2 tablet at 10/04/22 0433   ipratropium-albuterol (DUONEB) 0.5-2.5 (3) MG/3ML nebulizer solution 3 mL, 3 mL, Nebulization, TID, Esaw Grandchild A, DO, 3 mL at 10/04/22 0731   methocarbamol (ROBAXIN) tablet 750 mg, 750 mg, Oral, BID, Esaw Grandchild A, DO, 750 mg at 10/03/22 2158   metoprolol tartrate (LOPRESSOR) tablet 25 mg, 25 mg, Oral, BH-q7a, Duncan, Jerrye Beavers V, MD, 25 mg at 10/04/22 4540   mometasone-formoterol (DULERA) 200-5 MCG/ACT inhaler 2 puff, 2 puff, Inhalation, BID, Esaw Grandchild A, DO, 2 puff at 10/04/22 0811   multivitamin with minerals tablet 1 tablet, 1 tablet, Oral, Daily, Esaw Grandchild A, DO, 1 tablet at 10/03/22 1128   ondansetron (ZOFRAN) tablet 4 mg, 4 mg, Oral, Q6H PRN **OR** ondansetron (ZOFRAN) injection 4 mg, 4 mg, Intravenous, Q6H PRN, Andris Baumann, MD   Oral care mouth rinse, 15 mL, Mouth Rinse, PRN, Esaw Grandchild A, DO   [COMPLETED] methylPREDNISolone sodium succinate (SOLU-MEDROL) 40 mg/mL injection 40 mg, 40 mg, Intravenous, Q12H, 40 mg at 10/03/22 0803 **FOLLOWED BY** predniSONE (DELTASONE) tablet 40 mg, 40 mg, Oral, Q breakfast, Lindajo Royal V, MD, 40 mg at 10/04/22 9811   rOPINIRole (REQUIP) tablet 1 mg, 1 mg, Oral, BID, Lindajo Royal V, MD, 1 mg at 10/03/22 2158   traMADol (ULTRAM) tablet 50 mg, 50 mg, Oral, Q6H PRN, Esaw Grandchild A, DO    ALLERGIES   Nsaids, Paxlovid [nirmatrelvir-ritonavir], Tizanidine, Adhesive [tape], Aspirin, and Penicillins     REVIEW OF SYSTEMS    Review of Systems:  Gen:  Denies  fever, sweats, chills weigh loss  HEENT: Denies blurred vision, double vision, ear pain, eye pain, hearing loss, nose bleeds, sore  throat Cardiac:  No dizziness, chest pain or heaviness, chest tightness,edema Resp:   reports dyspnea chronically  Gi: Denies swallowing difficulty, stomach pain, nausea or vomiting, diarrhea, constipation, bowel incontinence Gu:  Denies bladder incontinence, burning urine Ext:   Denies Joint pain, stiffness or swelling Skin: Denies  skin rash, easy bruising or bleeding or hives Endoc:  Denies polyuria, polydipsia , polyphagia or weight change Psych:   Denies depression, insomnia or hallucinations   Other:  All other systems negative   VS: BP 102/60 (BP Location: Left Arm)   Pulse 76   Temp 98.4 F (36.9 C)   Resp 17   Ht 5\' 5"  (1.651 m)   Wt 86.2 kg   SpO2 98%   BMI 31.62 kg/m      PHYSICAL EXAM    GENERAL:NAD, no fevers, chills, no weakness no fatigue HEAD: Normocephalic, atraumatic.  EYES: Pupils equal, round, reactive to light. Extraocular muscles intact. No scleral icterus.  MOUTH: Moist mucosal membrane. Dentition intact. No abscess noted.  EAR, NOSE, THROAT: Clear without exudates. No external lesions.  NECK: Supple. No thyromegaly. No nodules. No JVD.  PULMONARY: decreased breath sounds with mild rhonchi worse at bases bilaterally.  CARDIOVASCULAR: S1 and S2. Regular rate and rhythm. No murmurs, rubs, or gallops. No edema. Pedal pulses 2+ bilaterally.  GASTROINTESTINAL: Soft, nontender, nondistended. No masses. Positive bowel sounds. No  hepatosplenomegaly.  MUSCULOSKELETAL: No swelling, clubbing, or edema. Range of motion full in all extremities.  NEUROLOGIC: Cranial nerves II through XII are intact. No gross focal neurological deficits. Sensation intact. Reflexes intact.  SKIN: No ulceration, lesions, rashes, or cyanosis. Skin warm and dry. Turgor intact.  PSYCHIATRIC: Mood, affect within normal limits. The patient is awake, alert and oriented x 3. Insight, judgment intact.       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   Patient/Family are satisfied with care plan and all questions have been answered.    Provider disclosure: Patient with at least one acute or chronic illness or injury that poses a threat to life or bodily function and is being managed actively during this encounter.  All of the below services have been performed independently by signing provider:  review of prior documentation from internal and or external health records.  Review of previous and current lab results.  Interview and comprehensive assessment during patient visit today. Review of current and previous chest radiographs/CT scans. Discussion of management and test interpretation with health care team and patient/family.   This document was prepared using Dragon voice recognition software and may include unintentional dictation errors.     Vida Rigger, M.D.  Division of Pulmonary & Critical Care Medicine

## 2022-10-04 NOTE — Plan of Care (Signed)

## 2022-10-04 NOTE — Assessment & Plan Note (Signed)
Body mass index is 31.62 kg/m. Complicates overall care and prognosis.  Recommend lifestyle modifications including physical activity and diet for weight loss and overall long-term health.

## 2022-10-04 NOTE — Progress Notes (Signed)
Transition of Care [facility]) - Inpatient Brief Assessment   Patient Details  Name: Jenna Tyler MRN: [id number] Date of Birth: 1966/04/18  Transition of Care [facility]) CM/SW Contact:    Garret Reddish, RN Phone Number: 10/04/2022, 11:32 AM   Clinical Narrative:  Chart reviewed.  Noted that patient was admitted with  COPD exacerbation.  Patient is currently on RA now.  She will not require home 02.  No TOC need identified.      Transition of Care Asessment: Insurance and Status: Insurance coverage has been reviewed Patient has primary care physician: Yes Home environment has been reviewed: Yes Prior level of function:: Independent Prior/Current Home Services: No current home services Social Determinants of Health Reivew: SDOH reviewed no interventions necessary Readmission risk has been reviewed: Yes Transition of care needs: no transition of care needs at this time

## 2022-10-04 NOTE — Progress Notes (Signed)
Patient was given verbal and written instructions, she acknowledge understanding and states she will comply,waiting for daughter to pick up.

## 2022-10-04 NOTE — Discharge Summary (Addendum)
Physician Discharge Summary   Patient: Jenna Tyler MRN: [id number] DOB: 08-13-1965  Admit date:     10/02/2022  Discharge date: 10/04/2022  Discharge Physician: Pennie Banter   PCP: Alm Bustard, NP   Recommendations at discharge:   Follow up with Pulmonology Follow up with Primary Care Referral to thoracic surgery as needed for intervention for tracheobronchomalacia Follow up pending BAL studies from recent bronchoscopy  Discharge Diagnoses: Active Problems:   Acquired hypothyroidism   HTN (hypertension)   Depression, major, recurrent, moderate (HCC)   Neurofibromatosis, type 1 (HCC)   Obesity (BMI 30-39.9)   Chronic pain  Principal Problem (Resolved):   COPD with acute exacerbation (HCC) Resolved Problems:   COPD exacerbation (HCC)   AKI (acute kidney injury) [facility])  Hospital Course: HPI on admission 10/02/22 by Dr. Para March: "Jenna Tyler is a 57 y.o. 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  Her cough developed 2 days after the procedure and she became concerned for development of pneumonia which she has had several times in the past.  She denies fever or chills.  Denies chest pain. ED course and data review: Afebrile, but tachycardic to 126 and tachypneic to 22 with O2 sat in the low 90s on room air.  BP 156/92.  Labs significant for WBC of 12,000 with lactic acid 1.7.Creatinine 1.23 above baseline of 0.84.  Troponin less than 0.2.  EKG, personally viewed and interpreted, showing sinus tachycardia at 109 with no acute ST-T wave changes. Chest x-ray shows underinflation and mild left basilar atelectasis ED treatment: Patient treated with DuoNebs x 2 Solu-Medrol and Tussionex  and given Levaquin due to penicillin allergy. Hospitalist consulted for admission for COPD exacerbation and possible pneumonia. "   Pt was admitted to hospital and continued on antibiotics and steroids.  Pulmonology consulted, given pt had recently undergone bronchoscopy with some BAL results still pending.   Further hospital course and management as outlined below.  6/13 -- pt doing well, breathing improved.  Stable and agreeable to d/c today.   Assessment and Plan:  COPD with acute exacerbation (HCC) Pneumonia/pneumonitis in the setting of bronchoscopy on 6/7 Sepsis - evidenced by tachycardia, tachypnea & concern for PNA Symptoms started 2 days after BAL to evaluate for tracheomalacia BAL results so far: Acid-fast smear negative, no fungus observed, no organisms seen, no growth --Transition IV >> PO steroids, prednisone 40 mg daily  --Discharge on prednisone taper - Treated with scheduled and PRN DuoNebs  -- Dulera (substituted for home Symbicort - resume at dc) - Treated with Levaquin on admission >> Rocephin/Zithromax  - Pt not hypoxic, stable spO2 on room air - Antitussives, incentive spirometer, flutter valve -Pulmonology consulted to follow-up post-bronchoscopy (pt's clinic appt is tomorrow 6/13)   AKI (acute kidney injury) (HCC) - resolved with IV fliuds Likely prerenal from dehydration, poor PO intake --Stable off IV fluids --Monitor BMP --Avoid nephrotoxins, renally dose meds   HTN (hypertension) BP controlled --Continue metoprolol   Acquired hypothyroidism --Continue levothyroxine   Chronic pain Continue multimodal pain meds with methocarbamol, gabapentin, Fioricet and amitriptyline   Neurofibromatosis, type 1 (HCC) No acute issues   Depression, major, recurrent, moderate (HCC) --Continue amitriptyline       Consultants: Pulmonology Procedures performed: None  Disposition: Home Diet  recommendation:  Cardiac diet DISCHARGE MEDICATION: Allergies as  of 10/04/2022       Reactions   Nsaids Other (See Comments)   Internal bleeding  Intestinal bleeding   Paxlovid [nirmatrelvir-ritonavir]    Tizanidine Other (See Comments)   Visual hallucinations   Adhesive [tape] Rash   Aspirin Other (See Comments)   Cannot take due to ulcers.   Penicillins Rash   Has patient had a PCN reaction causing immediate rash, facial/tongue/throat swelling, SOB or lightheadedness with hypotension: Yes Has patient had a PCN reaction causing severe rash involving mucus membranes or skin necrosis: No Has patient had a PCN reaction that required hospitalization: No Has patient had a PCN reaction occurring within the last 10 years: Yes If all of the above answers are "NO", then may proceed with Cephalosporin use. Patient tolerated ANCEF administration         Medication List     STOP taking these medications    guaiFENesin-codeine 100-10 MG/5ML syrup   lisinopril 5 MG tablet Commonly known as: ZESTRIL   meclizine 25 MG tablet Commonly known as: ANTIVERT       TAKE these medications    albuterol 108 (90 Base) MCG/ACT inhaler Commonly known as: VENTOLIN HFA Inhale 1-2 puffs into the lungs every 4 (four) hours as needed.   amitriptyline 100 MG tablet Commonly known as: ELAVIL Take 100 mg by mouth at bedtime.   azithromycin 500 MG tablet Commonly known as: Zithromax Take 1 tablet (500 mg total) by mouth daily for 3 days.   benzonatate 100 MG capsule Commonly known as: Tessalon Perles Take 2 capsules (200 mg total) by mouth 3 (three) times daily as needed for cough.   butalbital-acetaminophen-caffeine 50-325-40 MG tablet Commonly known as: FIORICET Take 1 tablet by mouth every 4 (four) hours as needed for headache.   cefadroxil 500 MG capsule Commonly known as: DURICEF Take 1 capsule (500 mg total) by mouth 2 (two) times daily for 3 days.   chlorpheniramine-HYDROcodone 10-8 MG/5ML Commonly known as: TUSSIONEX Take 5 mLs by mouth every  12 (twelve) hours.   gabapentin 800 MG tablet Commonly known as: NEURONTIN Take 800 mg by mouth 2 (two) times daily.   HYDROcodone-acetaminophen 5-325 MG tablet Commonly known as: NORCO/VICODIN Take 1 tablet by mouth every 4 (four) hours as needed for up to 3 days for moderate pain.   hydrOXYzine 50 MG capsule Commonly known as: VISTARIL Take 50 mg by mouth every 6 (six) hours.   levothyroxine 100 MCG tablet Commonly known as: SYNTHROID Take 100 mcg by mouth daily. What changed: Another medication with the same name was removed. Continue taking this medication, and follow the directions you see here.   metFORMIN 500 MG tablet Commonly known as: GLUCOPHAGE Take 500 mg by mouth 2 (two) times daily.   methocarbamol 750 MG tablet Commonly known as: ROBAXIN Take 750 mg by mouth 2 (two) times daily.   metoprolol tartrate 25 MG tablet Commonly known as: LOPRESSOR Take 25 mg by mouth every morning.   multivitamin with minerals Tabs tablet Take 1 tablet by mouth daily.   ondansetron 4 MG tablet Commonly known as: ZOFRAN Take 8 mg by mouth 2 (two) times daily.   predniSONE 10 MG tablet Commonly known as: DELTASONE Take 3 tablets (30 mg total) by mouth daily for 1 day, THEN 2 tablets (20 mg total) daily for 1 day, THEN 1 tablet (10 mg total) daily for 1 day. Start taking on: October 04, 2022   rOPINIRole  0.5 MG tablet Commonly known as: REQUIP Take 1 mg by mouth 2 (two) times daily.   Symbicort 160-4.5 MCG/ACT inhaler Generic drug: budesonide-formoterol Inhale 2 puffs into the lungs 2 (two) times daily.   traMADol 50 MG tablet Commonly known as: ULTRAM Take 50 mg by mouth every 6 (six) hours as needed.        Discharge Exam: Filed Weights   10/02/22 1548  Weight: 86.2 kg   General exam: awake, alert, no acute distress HEENT: atraumatic, clear conjunctiva, anicteric sclera, moist mucus membranes, hearing grossly normal  Respiratory system: CTAB, no wheezes, rales or  rhonchi, normal respiratory effort. Cardiovascular system: normal S1/S2, RRR, no JVD, murmurs, rubs, gallops, no pedal edema.   Gastrointestinal system: soft, NT, ND, no HSM felt, +bowel sounds. Central nervous system: A&O x 4. no gross focal neurologic deficits, normal speech Extremities: moves all, no edema, normal tone Skin: dry, intact, normal temperature, normal color, No rashes, lesions or ulcers Psychiatry: normal mood, congruent affect, judgement and insight appear normal   Condition at discharge: stable  The results of significant diagnostics from this hospitalization (including imaging, microbiology, ancillary and laboratory) are listed below for reference.   Imaging Studies: DG Chest 2 View  Result Date: 10/02/2022 CLINICAL DATA:  Cough and chest pain EXAM: CHEST - 2 VIEW COMPARISON:  X-ray 09/28/2022 FINDINGS: Underinflation. Mild linear opacity left lung base likely scar or atelectasis. No pneumothorax, effusion or edema. Surgical clips in the right upper quadrant of the abdomen. Slight pectus excavatum on the lateral view. IMPRESSION: Underinflation.  Mild left basilar atelectasis. Electronically Signed   By: Karen Kays M.D.   On: 10/02/2022 17:33   DG Chest Port 1 View  Result Date: 09/28/2022 CLINICAL DATA:  Status post bronchoscopy. EXAM: PORTABLE CHEST 1 VIEW COMPARISON:  04/09/2022 FINDINGS: Stable top-normal heart size. Mild bibasilar atelectasis. There is no evidence of pulmonary edema, consolidation, pneumothorax or pleural fluid. Visualized bony structures are unremarkable. IMPRESSION: Mild bibasilar atelectasis.  No pneumothorax. Electronically Signed   By: Irish Lack M.D.   On: 09/28/2022 13:55   CT CORONARY MORPH W/CTA COR W/SCORE W/CA W/CM &/OR WO/CM  Addendum Date: 09/10/2022   ADDENDUM REPORT: 09/10/2022 20:36 CLINICAL DATA:  This over-read does not include interpretation of cardiac or coronary anatomy or pathology. The coronary CTA interpretation by the  cardiologist is attached. COMPARISON:  None available. FINDINGS: No suspicious nodules, masses, or infiltrates are identified in the visualized portion of the lungs. No pleural fluid seen. The visualized portions of the mediastinum and hilar regions are unremarkable. IMPRESSION: No significant non-cardiac abnormality identified. Electronically Signed   By: Danae Orleans M.D.   On: 09/10/2022 20:36   Result Date: 09/10/2022 CLINICAL DATA:  This is a 57 year old female with anginal symptoms. EXAM: Cardiac/Coronary  CTA TECHNIQUE: The patient was scanned on a [employer]. FINDINGS: A 100 kV prospective scan was triggered in the descending thoracic aorta at 111 HU's. Axial non-contrast 3 mm slices were carried out through the heart. The data set was analyzed on a dedicated work station and scored using the Agatson method. Gantry rotation speed was 250 msecs and collimation was .6 mm. No beta blockade and 0.8 mg of sl NTG was given. The 3D data set was reconstructed in 5% intervals of the 67-82 % of the R-R cycle. Diastolic phases were analyzed on a dedicated work station using MPR, MIP and VRT modes. The patient received 80 cc of contrast. Aorta: Normal size.  No  calcifications.  No dissection. Aortic Valve:  Trileaflet.  No calcifications. Coronary Arteries:  Normal coronary origin.  Right dominance. RCA is a large dominant artery that gives rise to PDA and PLA. There is no plaque. Left main is a large artery that gives rise to LAD and LCX arteries. LAD is a large vessel that has no plaque. LCX is a non-dominant artery that gives rise to one large OM1 branch. There is no plaque. Coronary Calcium Score: Left main: 0 Left anterior descending artery: 0 Left circumflex artery: 0 Right coronary artery: 0 Total: 0 Percentile: 0 Other findings: Normal pulmonary vein drainage into the left atrium. Normal left atrial appendage without a thrombus. Normal size of the pulmonary artery. IMPRESSION: 1. Coronary calcium  score of 0. This was 0 percentile for age and sex matched control. 2. Normal coronary origin with right dominance. 3. CAD-RADS 0. No evidence of CAD (0%). Consider non-atherosclerotic causes of chest pain. The noncardiac portion of this study will be interpreted in separate report by the radiologist. Electronically Signed: By: Thomasene Ripple D.O. On: 09/06/2022 14:45    Microbiology: Results for orders placed or performed during the hospital encounter of 10/02/22  Culture, blood (Routine X 2) Call MD if unable to obtain prior to antibiotics being given     Status: None (Preliminary result)   Collection Time: 10/02/22 10:41 PM   Specimen: BLOOD LEFT ARM  Result Value Ref Range Status   Specimen Description BLOOD LEFT ARM  Final   Special Requests   Final    BOTTLES DRAWN AEROBIC AND ANAEROBIC Blood Culture adequate volume   Culture   Final    NO GROWTH 3 DAYS Performed at [facility], [address], [place], [place] [address]    Report Status PENDING  Incomplete  Culture, blood (Routine X 2) Call MD if unable to obtain prior to antibiotics being given     Status: None (Preliminary result)   Collection Time: 10/02/22 10:43 PM   Specimen: BLOOD RIGHT HAND  Result Value Ref Range Status   Specimen Description BLOOD RIGHT HAND  Final   Special Requests   Final    BOTTLES DRAWN AEROBIC AND ANAEROBIC Blood Culture adequate volume   Culture   Final    NO GROWTH 3 DAYS Performed at [facility], [address] [place], [place] [address]    Report Status PENDING  Incomplete    Labs: CBC: Recent Labs  Lab 10/02/22 1559 10/03/22 0535  WBC 12.2* 10.1  HGB 15.7* 13.9  HCT 47.6* 41.3  MCV 86.4 85.5  PLT 256 229   Basic Metabolic Panel: Recent Labs  Lab 10/02/22 1559 10/03/22 0535 10/04/22 0348  NA 136 132* 138  K 4.1 4.4 4.5  CL 102 102 106  CO2 23 20* 23  GLUCOSE 112* 240* 177*  BUN 20 20 16   CREATININE 1.23* 1.03* 0.87  CALCIUM 9.3 8.8* 9.2  MG  --   --   2.4   Liver Function Tests: No results for input(s): "AST", "ALT", "ALKPHOS", "BILITOT", "PROT", "ALBUMIN" in the last 168 hours. CBG: No results for input(s): "GLUCAP" in the last 168 hours.   Discharge time spent: less than 30 minutes.  Signed: Pennie Banter, DO [facility] 10/05/2022

## 2022-10-05 ENCOUNTER — Encounter: Payer: Self-pay | Admitting: Internal Medicine

## 2022-10-05 LAB — CULTURE, BLOOD (ROUTINE X 2): Special Requests: ADEQUATE

## 2022-10-06 LAB — CULTURE, BLOOD (ROUTINE X 2)

## 2022-10-07 LAB — CULTURE, BLOOD (ROUTINE X 2)
Culture: NO GROWTH
Culture: NO GROWTH

## 2022-10-15 ENCOUNTER — Ambulatory Visit: Payer: Medicaid Other | Admitting: Internal Medicine

## 2022-10-16 ENCOUNTER — Ambulatory Visit: Payer: Medicaid Other | Admitting: Internal Medicine

## 2022-10-18 ENCOUNTER — Encounter: Payer: Self-pay | Admitting: Oncology

## 2022-10-25 ENCOUNTER — Encounter: Payer: Self-pay | Admitting: Oncology

## 2022-10-29 ENCOUNTER — Encounter: Payer: Self-pay | Admitting: *Deleted

## 2022-10-29 LAB — FUNGAL ORGANISM REFLEX

## 2022-10-29 LAB — FUNGUS CULTURE WITH STAIN

## 2022-11-12 LAB — ACID FAST CULTURE WITH REFLEXED SENSITIVITIES (MYCOBACTERIA): Acid Fast Culture: NEGATIVE

## 2022-12-11 ENCOUNTER — Encounter: Payer: Self-pay | Admitting: Emergency Medicine

## 2022-12-11 ENCOUNTER — Other Ambulatory Visit: Payer: Self-pay

## 2022-12-11 DIAGNOSIS — I10 Essential (primary) hypertension: Secondary | ICD-10-CM | POA: Diagnosis not present

## 2022-12-11 DIAGNOSIS — Z7984 Long term (current) use of oral hypoglycemic drugs: Secondary | ICD-10-CM | POA: Insufficient documentation

## 2022-12-11 DIAGNOSIS — E039 Hypothyroidism, unspecified: Secondary | ICD-10-CM | POA: Diagnosis not present

## 2022-12-11 DIAGNOSIS — Z7951 Long term (current) use of inhaled steroids: Secondary | ICD-10-CM | POA: Diagnosis not present

## 2022-12-11 DIAGNOSIS — R1010 Upper abdominal pain, unspecified: Secondary | ICD-10-CM | POA: Diagnosis present

## 2022-12-11 DIAGNOSIS — J449 Chronic obstructive pulmonary disease, unspecified: Secondary | ICD-10-CM | POA: Diagnosis not present

## 2022-12-11 DIAGNOSIS — Z79899 Other long term (current) drug therapy: Secondary | ICD-10-CM | POA: Diagnosis not present

## 2022-12-11 DIAGNOSIS — K5900 Constipation, unspecified: Secondary | ICD-10-CM | POA: Insufficient documentation

## 2022-12-11 DIAGNOSIS — J45909 Unspecified asthma, uncomplicated: Secondary | ICD-10-CM | POA: Insufficient documentation

## 2022-12-11 DIAGNOSIS — E119 Type 2 diabetes mellitus without complications: Secondary | ICD-10-CM | POA: Insufficient documentation

## 2022-12-11 LAB — CBC WITH DIFFERENTIAL/PLATELET
Abs Immature Granulocytes: 0.04 10*3/uL (ref 0.00–0.07)
Basophils Absolute: 0.1 10*3/uL (ref 0.0–0.1)
Basophils Relative: 1 %
Eosinophils Absolute: 0.5 10*3/uL (ref 0.0–0.5)
Eosinophils Relative: 6 %
HCT: 45.5 % (ref 36.0–46.0)
Hemoglobin: 14.9 g/dL (ref 12.0–15.0)
Immature Granulocytes: 0 %
Lymphocytes Relative: 23 %
Lymphs Abs: 2.2 10*3/uL (ref 0.7–4.0)
MCH: 28.2 pg (ref 26.0–34.0)
MCHC: 32.7 g/dL (ref 30.0–36.0)
MCV: 86 fL (ref 80.0–100.0)
Monocytes Absolute: 0.6 10*3/uL (ref 0.1–1.0)
Monocytes Relative: 6 %
Neutro Abs: 6 10*3/uL (ref 1.7–7.7)
Neutrophils Relative %: 64 %
Platelets: 297 10*3/uL (ref 150–400)
RBC: 5.29 MIL/uL — ABNORMAL HIGH (ref 3.87–5.11)
RDW: 13.5 % (ref 11.5–15.5)
WBC: 9.4 10*3/uL (ref 4.0–10.5)
nRBC: 0 % (ref 0.0–0.2)

## 2022-12-11 LAB — URINALYSIS, ROUTINE W REFLEX MICROSCOPIC
Bacteria, UA: NONE SEEN
Glucose, UA: NEGATIVE mg/dL
Hgb urine dipstick: NEGATIVE
Ketones, ur: NEGATIVE mg/dL
Nitrite: NEGATIVE
Protein, ur: NEGATIVE mg/dL
Specific Gravity, Urine: 1.031 — ABNORMAL HIGH (ref 1.005–1.030)
pH: 5 (ref 5.0–8.0)

## 2022-12-11 NOTE — ED Triage Notes (Signed)
Patient ambulatory to triage with steady gait, without difficulty or distress noted;pt reports upper abd pain x wk, nonradiating with no accomp symptoms

## 2022-12-12 ENCOUNTER — Emergency Department: Payer: Medicaid Other

## 2022-12-12 ENCOUNTER — Emergency Department
Admission: EM | Admit: 2022-12-12 | Discharge: 2022-12-12 | Disposition: A | Discharge status: Home / Self Care | Payer: Medicaid Other | Attending: Emergency Medicine | Admitting: Emergency Medicine

## 2022-12-12 DIAGNOSIS — K5900 Constipation, unspecified: Secondary | ICD-10-CM

## 2022-12-12 LAB — COMPREHENSIVE METABOLIC PANEL
ALT: 13 U/L (ref 0–44)
AST: 12 U/L — ABNORMAL LOW (ref 15–41)
Albumin: 3.5 g/dL (ref 3.5–5.0)
Alkaline Phosphatase: 80 U/L (ref 38–126)
Anion gap: 5 (ref 5–15)
BUN: 24 mg/dL — ABNORMAL HIGH (ref 6–20)
CO2: 26 mmol/L (ref 22–32)
Calcium: 8.9 mg/dL (ref 8.9–10.3)
Chloride: 109 mmol/L (ref 98–111)
Creatinine, Ser: 0.96 mg/dL (ref 0.44–1.00)
GFR, Estimated: >60 mL/min (ref >60)
Glucose, Bld: 104 mg/dL — ABNORMAL HIGH (ref 70–99)
Potassium: 3.9 mmol/L (ref 3.5–5.1)
Sodium: 140 mmol/L (ref 135–145)
Total Bilirubin: 0.7 mg/dL (ref 0.3–1.2)
Total Protein: 7.2 g/dL (ref 6.5–8.1)

## 2022-12-12 LAB — LIPASE, BLOOD: Lipase: 24 U/L (ref 11–51)

## 2022-12-12 LAB — TROPONIN I (HIGH SENSITIVITY): Troponin I (High Sensitivity): <2 ng/L (ref <18)

## 2022-12-12 MED ORDER — DICYCLOMINE HCL 20 MG PO TABS: 20.0000 mg | ORAL_TABLET | Freq: Three times a day (TID) | ORAL | 0 refills | Status: DC | PRN

## 2022-12-12 MED ORDER — ONDANSETRON 4 MG PO TBDP: 4.0000 mg | ORAL_TABLET | Freq: Four times a day (QID) | ORAL | 0 refills | Status: AC | PRN

## 2022-12-12 MED ORDER — MORPHINE SULFATE (PF) 4 MG/ML IV SOLN
4.0000 mg | Freq: Once | INTRAVENOUS | Status: AC
  Administered 2022-12-12: 4 mg via INTRAVENOUS
  Filled 2022-12-12: qty 1

## 2022-12-12 MED ORDER — ONDANSETRON HCL 4 MG/2ML IJ SOLN
4.0000 mg | Freq: Once | INTRAMUSCULAR | Status: AC
  Administered 2022-12-12: 4 mg via INTRAVENOUS
  Filled 2022-12-12: qty 2

## 2022-12-12 MED ORDER — SODIUM CHLORIDE 0.9 % IV BOLUS (SEPSIS)
1000.0000 mL | Freq: Once | INTRAVENOUS | Status: AC
  Administered 2022-12-12: 1000 mL via INTRAVENOUS

## 2022-12-12 MED ORDER — DOCUSATE SODIUM 100 MG PO CAPS: 100.0000 mg | ORAL_CAPSULE | Freq: Two times a day (BID) | ORAL | 0 refills | Status: AC

## 2022-12-12 MED ORDER — POLYETHYLENE GLYCOL 3350 17 G PO PACK: 17.0000 g | PACK | Freq: Every day | ORAL | 0 refills | Status: DC

## 2022-12-12 MED ORDER — HYDROMORPHONE HCL 1 MG/ML IJ SOLN
1.0000 mg | Freq: Once | INTRAMUSCULAR | Status: AC
  Administered 2022-12-12: 1 mg via INTRAVENOUS
  Filled 2022-12-12: qty 1

## 2022-12-12 MED ORDER — IOHEXOL 300 MG/ML  SOLN
100.0000 mL | Freq: Once | INTRAMUSCULAR | Status: AC | PRN
  Administered 2022-12-12: 100 mL via INTRAVENOUS

## 2022-12-12 NOTE — ED Notes (Signed)
Patient transported to CT 

## 2022-12-12 NOTE — ED Notes (Signed)
Patient discharged at this time. Ambulated to lobby with independent and steady gait. Breathing unlabored speaking in full sentences. Verbalized understanding of all discharge, follow up, and medication teaching. Discharged homed with all belongings. Discharged with ride from [employer].

## 2022-12-12 NOTE — ED Provider Notes (Signed)
[facility] Provider Note    Event Date/Time   First MD Initiated Contact with Patient 12/12/22 0140     (approximate)   History   Abdominal Pain   HPI  Jenna Tyler is a 57 y.o. female with history of COPD, tracheobronchomalacia, hypertension, diabetes, hyperlipidemia, neurofibromatosis who presents to the emergency department upper abdominal pain.  No vomiting or diarrhea.  No fever.  No chest pain or new shortness of breath.  States this feels similar to when she previously had pancreatitis.  She has had previous appendectomy, hysterectomy, cholecystectomy.   History provided by patient.    Past Medical History:  Diagnosis Date   AKI (acute kidney injury) (HCC) 07/12/2017   Asthma    COPD (chronic obstructive pulmonary disease) (HCC)    COPD exacerbation (HCC) 12/29/2016   COPD with acute exacerbation (HCC) 10/02/2022   DM (diabetes mellitus), type 2 (HCC)    Dyspnea    Fibromyalgia    Guillain Barr syndrome (HCC)    Headache    Hyperlipidemia    Hypertension    Hypothyroidism    Neurofibromatosis, peripheral, NF1 (HCC) 2017   Pancreatitis    PONV (postoperative nausea and vomiting)    Pulmonary nodule    RLS (restless legs syndrome)    Sleep apnea    never went to pick up her cpap   Tachycardia    Tracheomalacia     Past Surgical History:  Procedure Laterality Date   ABDOMINAL HYSTERECTOMY     BREAST BIOPSY Right 2012   negative   BRONCHIAL WASHINGS N/A 09/28/2022   Procedure: BRONCHIAL WASHINGS;  Surgeon: Vida Rigger, MD;  Location: [facility];  Service: Thoracic;  Laterality: N/A;   CHOLECYSTECTOMY     FLEXIBLE BRONCHOSCOPY N/A 09/28/2022   Procedure: FLEXIBLE BRONCHOSCOPY;  Surgeon: Vida Rigger, MD;  Location: [facility];  Service: Thoracic;  Laterality: N/A;   FOOT SURGERY     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 Authorizing Provider  albuterol (VENTOLIN HFA) 108 (90 Base) MCG/ACT inhaler Inhale 1-2 puffs into the lungs every 4 (four) hours as needed. 02/22/22   [provider]  amitriptyline (ELAVIL) 100 MG tablet Take 100 mg by mouth at bedtime.    [provider]  butalbital-acetaminophen-caffeine (FIORICET) 50-325-40 MG tablet Take 1 tablet by mouth every 4 (four) hours as needed for headache. 03/26/22   Georga Hacking, MD  chlorpheniramine-HYDROcodone (TUSSIONEX) 10-8 MG/5ML Take 5 mLs by mouth every 12 (twelve) hours. 10/04/22   Pennie Banter, DO  gabapentin (NEURONTIN) 800 MG tablet Take 800 mg by mouth 2 (two) times daily. 11/08/20   [provider]  hydrOXYzine (VISTARIL) 50 MG capsule Take 50 mg by mouth every 6 (six) hours. 07/16/22   [provider]  levothyroxine (SYNTHROID) 100 MCG tablet Take 100 mcg by mouth daily.    [provider]  metFORMIN (GLUCOPHAGE) 500 MG tablet Take 500 mg by mouth 2 (two) times daily.    [provider]  methocarbamol (ROBAXIN)  750 MG tablet Take 750 mg by mouth 2 (two) times daily. 09/22/21   [provider]  metoprolol tartrate (LOPRESSOR) 25 MG tablet Take 25 mg by mouth every morning.    [provider]  Multiple Vitamin (MULTIVITAMIN WITH MINERALS) TABS tablet Take 1 tablet by mouth daily. 10/05/22   Esaw Grandchild A, DO  ondansetron (ZOFRAN) 4 MG tablet Take 8 mg by mouth 2 (two) times daily. 09/06/22   [provider]  rOPINIRole (REQUIP) 0.5 MG tablet Take 1 mg by mouth 2 (two) times daily.    [provider]  SYMBICORT 160-4.5  MCG/ACT inhaler Inhale 2 puffs into the lungs 2 (two) times daily. 05/25/20   [provider]  traMADol (ULTRAM) 50 MG tablet Take 50 mg by mouth every 6 (six) hours as needed.    [provider]    Physical Exam   Triage Vital Signs: ED Triage Vitals  Encounter Vitals Group     BP 12/11/22 2329 126/83     Systolic BP Percentile --      Diastolic BP Percentile --      Pulse Rate 12/11/22 2329 67     Resp 12/11/22 2329 18     Temp 12/11/22 2329 97.7 F (36.5 C)     Temp Source 12/11/22 2329 Oral     SpO2 12/11/22 2329 95 %     Weight 12/11/22 2222 192 lb (87.1 kg)     Height 12/11/22 2222 5\' 5"  (1.651 m)     Head Circumference --      Peak Flow --      Pain Score 12/11/22 2222 7     Pain Loc --      Pain Education --      Exclude from Growth Chart --     Most recent vital signs: Vitals:   12/11/22 2329 12/12/22 0356  BP: 126/83 131/70  Pulse: 67 65  Resp: 18 18  Temp: 97.7 F (36.5 C) 97.9 F (36.6 C)  SpO2: 95% 96%    CONSTITUTIONAL: Alert, responds appropriately to questions. Well-appearing; well-nourished HEAD: Normocephalic, atraumatic EYES: Conjunctivae clear, pupils appear equal, sclera nonicteric ENT: normal nose; moist mucous membranes NECK: Supple, normal ROM CARD: RRR; S1 and S2 appreciated RESP: Normal chest excursion without splinting or tachypnea; breath sounds clear and equal bilaterally; no wheezes, no rhonchi, no rales, no hypoxia or respiratory distress, speaking full sentences ABD/GI: Non-distended; soft, tender to palpation throughout the upper abdomen no guarding or rebound BACK: The back appears normal EXT: Normal ROM in all joints; no deformity noted, no edema SKIN: Normal color for age and race; warm; no rash on exposed skin NEURO: Moves all extremities equally, normal speech PSYCH: The patient's mood and manner are appropriate.   ED Results / Procedures / Treatments   LABS: (all labs ordered are listed, but only abnormal  results are displayed) Labs Reviewed  CBC WITH DIFFERENTIAL/PLATELET - Abnormal; Notable for the following components:      Result Value   RBC 5.29 (*)    All other components within normal limits  COMPREHENSIVE METABOLIC PANEL - Abnormal; Notable for the following components:   Glucose, Bld 104 (*)    BUN 24 (*)    AST 12 (*)    All other components within normal limits  URINALYSIS, ROUTINE W REFLEX MICROSCOPIC - Abnormal; Notable for the following components:   Color, Urine YELLOW (*)    APPearance HAZY (*)    Specific Gravity, Urine 1.031 (*)  Bilirubin Urine SMALL (*)    Leukocytes,Ua MODERATE (*)    All other components within normal limits  LIPASE, BLOOD  TROPONIN I (HIGH SENSITIVITY)     EKG:  EKG Interpretation Date/Time:  Tuesday December 11 2022 23:36:30 EDT Ventricular Rate:  61 PR Interval:  178 QRS Duration:  82 QT Interval:  398 QTC Calculation: 400 R Axis:   9  Text Interpretation: Normal sinus rhythm Low voltage QRS Cannot rule out Inferior infarct , age undetermined Cannot rule out Anterior infarct , age undetermined Abnormal ECG When compared with ECG of 02-Oct-2022 15:53, Vent. rate has decreased BY  48 BPM Questionable change in QRS axis Confirmed by Elhadj Girton, Baxter Hire [phone]) on 12/12/2022 1:42:26 AM         RADIOLOGY: My personal review and interpretation of imaging: CT scan shows constipation.  I have personally reviewed all radiology reports.   CT ABDOMEN PELVIS W CONTRAST  Result Date: 12/12/2022 CLINICAL DATA:  Upper abdominal pain EXAM: CT ABDOMEN AND PELVIS WITH CONTRAST TECHNIQUE: Multidetector CT imaging of the abdomen and pelvis was performed using the standard protocol following bolus administration of intravenous contrast. RADIATION DOSE REDUCTION: This exam was performed according to the departmental dose-optimization program which includes automated exposure control, adjustment of the mA and/or kV according to patient size and/or use of  iterative reconstruction technique. CONTRAST:  OMNIPAQUE IOHEXOL 300 MG/ML  SOLN COMPARISON:  PET/CT 07/24/2022 and CT abdomen and pelvis 07/06/2022 FINDINGS: Lower chest: No acute abnormality. Hepatobiliary: Hemangioma in the right hepatic lobe. Cholecystectomy. Pancreas: Fatty atrophy without acute abnormality. Spleen: Unremarkable. Adrenals/Urinary Tract: Normal adrenal glands. No urinary calculi or hydronephrosis. Bladder is unremarkable. Stomach/Bowel: Normal caliber large and small bowel. Moderate colonic stool load. No bowel wall thickening. The appendix is not visualized.Stomach is within normal limits. Vascular/Lymphatic: No significant vascular findings are present. No enlarged abdominal or pelvic lymph nodes. Reproductive: Hysterectomy. Other: No free intraperitoneal fluid or air. Musculoskeletal: No acute fracture. IMPRESSION: 1. No acute abnormality in the abdomen or pelvis. 2. Moderate colonic stool load in the transverse colon. Electronically Signed   By: Minerva Fester M.D.   On: 12/12/2022 02:39     PROCEDURES:  Critical Care performed: No     Procedures    IMPRESSION / MDM / ASSESSMENT AND PLAN / ED COURSE  I reviewed the triage vital signs and the nursing notes.    Patient here for upper abdominal pain.     DIFFERENTIAL DIAGNOSIS (includes but not limited to):   Gastritis, GERD, gastroparesis, H. pylori, gastric ulcer, constipation, bowel obstruction, pancreatitis   Patient's presentation is most consistent with acute presentation with potential threat to life or bodily function.   PLAN: Workup initiated from triage.  No leukocytosis, normal creatinine, LFTs and lipase.  Urine shows no sign of infection.  Will add on troponin.  EKG nonischemic.  Will obtain CT abdomen pelvis.  Will give IV fluids, pain and nausea medicine.   MEDICATIONS GIVEN IN ED: Medications  sodium chloride 0.9 % bolus 1,000 mL (0 mLs Intravenous Stopped 12/12/22 0355)  morphine (PF) 4  MG/ML injection 4 mg (4 mg Intravenous Given 12/12/22 0214)  ondansetron (ZOFRAN) injection 4 mg (4 mg Intravenous Given 12/12/22 0214)  iohexol (OMNIPAQUE) 300 MG/ML solution 100 mL (100 mLs Intravenous Contrast Given 12/12/22 0218)  HYDROmorphone (DILAUDID) injection 1 mg (1 mg Intravenous Given 12/12/22 0238)     ED COURSE: CT scan reviewed and interpreted by myself and the radiologist and shows moderate stool burden  in the transverse colon which could contribute to her pain.  Recommended MiraLAX, Colace, high-fiber diet.  Recommended avoiding narcotic pain medication.  Will discharge with Bentyl.   At this time, I do not feel there is any life-threatening condition present. I reviewed all nursing notes, vitals, pertinent previous records.  All lab and urine results, EKGs, imaging ordered have been independently reviewed and interpreted by myself.  I reviewed all available radiology reports from any imaging ordered this visit.  Based on my assessment, I feel the patient is safe to be discharged home without further emergent workup and can continue workup as an outpatient as needed. Discussed all findings, treatment plan as well as usual and customary return precautions.  They verbalize understanding and are comfortable with this plan.  Outpatient follow-up has been provided as needed.  All questions have been answered.    CONSULTS:  none   OUTSIDE RECORDS REVIEWED: Reviewed last PCP note on 12/06/2022.       FINAL CLINICAL IMPRESSION(S) / ED DIAGNOSES   Final diagnoses:  Constipation, unspecified constipation type     Rx / DC Orders   ED Discharge Orders          Ordered    dicyclomine (BENTYL) 20 MG tablet  Every 8 hours PRN        12/12/22 0310    ondansetron (ZOFRAN-ODT) 4 MG disintegrating tablet  Every 6 hours PRN        12/12/22 0310    docusate sodium (COLACE) 100 MG capsule  2 times daily        12/12/22 0310    polyethylene glycol (MIRALAX) 17 g packet  Daily         12/12/22 0310             Note:  This document was prepared using Dragon voice recognition software and may include unintentional dictation errors.   Laydon Martis, Layla Maw, DO 12/12/22 [phone]

## 2022-12-12 NOTE — Discharge Instructions (Addendum)
I recommend that you increase your water and fiber intake. If you are not able to eat foods high in fiber, you may use Benefiber or Metamucil over-the-counter. I also recommend you use MiraLAX 1-2 times a day and Colace 100 mg twice a day to help with bowel movements. These medications are over the counter.  You may use other over-the-counter medications such as Dulcolax, Fleet enemas, magnesium citrate as needed for constipation. Please note that some of these medications may cause you to have abdominal cramping which is normal. If you develop severe abdominal pain, fever (temperature of 100.4 or higher), persistent vomiting, distention of your abdomen, unable to have a bowel movement for 5 days or are not passing gas, please return to the hospital. ° °

## 2023-01-16 DIAGNOSIS — E119 Type 2 diabetes mellitus without complications: Secondary | ICD-10-CM | POA: Insufficient documentation

## 2023-01-16 DIAGNOSIS — G4733 Obstructive sleep apnea (adult) (pediatric): Secondary | ICD-10-CM | POA: Insufficient documentation

## 2023-02-12 NOTE — Progress Notes (Signed)
Pt went to wrong location, will reschedule

## 2023-02-13 ENCOUNTER — Ambulatory Visit: Payer: Medicaid Other | Admitting: Internal Medicine

## 2023-02-13 DIAGNOSIS — Z91199 Patient's noncompliance with other medical treatment and regimen due to unspecified reason: Secondary | ICD-10-CM

## 2023-02-15 NOTE — Progress Notes (Unsigned)
Sleep Medicine   Office Visit  Patient Name: Jenna Tyler DOB: [birth year] MRN [id number]    Chief Complaint: history of OSA  Brief History:  Jenna Tyler presents for an initial consult for sleep re-evaluation. She has a history of OSA diagnosed in 2022 but was never on CPAP. 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 Her providers have  recommended she start treatment for her OSA. Patient has a 2 year history of sleep apnea but has not been on PAP therapy. Sleep quality is poor. This is noted most nights. The patient's bed partner reports  low oxygen during hospital visit at night. The patient relates the following symptoms: snoring, gasping, frequent awakenings, excessive daytime sleepiness and morning headaches are also present. The patient goes to sleep at 11:30 pm and wakes up at 8 am.  Sleep quality is same when outside home environment.  Patient has noted restlessness of her legs at night that would disrupt her sleep.  The patient  relates no unusual behavior during the night.  The patient relates  a history of psychiatric problems. The Epworth Sleepiness Score is 16 out of 24 .  The patient relates  Cardiovascular risk factors include: hypertension,  but has not required therapy.     ROS  General: (-) fever, (-) chills, (-) night sweat Nose and Sinuses: (-) nasal stuffiness or itchiness, (-) postnasal drip, (-) nosebleeds, (-) sinus trouble. Mouth and Throat: (-) sore throat, (-) hoarseness. Neck: (-) swollen glands, (-) enlarged thyroid, (-) neck pain. Respiratory: + cough, + shortness of breath, + wheezing. Neurologic: - numbness, - tingling. Psychiatric: - anxiety, - depression Sleep behavior: -sleep paralysis -hypnogogic hallucinations -dream enactment      -vivid dreams -cataplexy -night terrors -sleep walking   Current Medication: Outpatient Encounter Medications as of 02/18/2023   Medication Sig   albuterol (PROVENTIL) (2.5 MG/3ML) 0.083% nebulizer solution Inhale into the lungs.   budesonide (PULMICORT) 0.25 MG/2ML nebulizer solution Inhale into the lungs.   Cholecalciferol 50 MCG (2000 UT) TABS Take by mouth.   hydrOXYzine (ATARAX) 25 MG tablet Take by mouth.   meclizine (ANTIVERT) 25 MG tablet Take 25mg  1-3 times daily as needed   nortriptyline (PAMELOR) 10 MG capsule    omeprazole (PRILOSEC) 40 MG capsule Take by mouth.   albuterol (VENTOLIN HFA) 108 (90 Base) MCG/ACT inhaler Inhale 1-2 puffs into the lungs every 4 (four) hours as needed.   butalbital-acetaminophen-caffeine (FIORICET) 50-325-40 MG tablet Take 1 tablet by mouth every 4 (four) hours as needed for headache.   chlorpheniramine-HYDROcodone (TUSSIONEX) 10-8 MG/5ML Take 5 mLs by mouth every 12 (twelve) hours.   dicyclomine (BENTYL) 20 MG tablet Take 1 tablet (20 mg total) by mouth every 8 (eight) hours as needed.   gabapentin (NEURONTIN) 800 MG tablet Take 800 mg by mouth 2 (two) times daily.   levothyroxine (SYNTHROID) 100 MCG tablet Take 100 mcg by mouth daily.   metFORMIN (GLUCOPHAGE) 500 MG tablet Take 500 mg by mouth 2 (two) times daily.   methocarbamol (ROBAXIN) 750 MG tablet Take 750 mg by mouth 2 (two) times daily.   metoprolol tartrate (LOPRESSOR) 25 MG tablet Take 25 mg by mouth every morning.   Multiple Vitamin (MULTIVITAMIN WITH MINERALS) TABS tablet Take 1 tablet by mouth daily.   ondansetron (ZOFRAN) 4 MG tablet Take 8 mg by mouth 2 (two) times daily.   ondansetron (ZOFRAN-ODT) 4 MG disintegrating tablet Take 1 tablet (4  mg total) by mouth every 6 (six) hours as needed for nausea or vomiting.   polyethylene glycol (MIRALAX) 17 g packet Take 17 g by mouth daily.   rOPINIRole (REQUIP) 0.5 MG tablet Take 1 mg by mouth 2 (two) times daily.   SYMBICORT 160-4.5 MCG/ACT inhaler Inhale 2 puffs into the lungs 2 (two) times daily.   [DISCONTINUED] amitriptyline (ELAVIL) 100 MG tablet Take 100 mg by  mouth at bedtime.   [DISCONTINUED] hydrOXYzine (VISTARIL) 50 MG capsule Take 50 mg by mouth every 6 (six) hours.   [DISCONTINUED] traMADol (ULTRAM) 50 MG tablet Take 50 mg by mouth every 6 (six) hours as needed.   No facility-administered encounter medications on file as of 02/18/2023.    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   OTHER SURGICAL HISTORY     excision of lipoma   OTHER SURGICAL HISTORY     Abdominal surgery   THYROID SURGERY     thyroidectomy   XI ROBOTIC LAPAROSCOPIC ASSISTED APPENDECTOMY N/A 04/08/2020   Procedure: XI ROBOTIC LAPAROSCOPIC ASSISTED APPENDECTOMY;  Surgeon: Sung Amabile, DO;  Location: [facility] ORS;  Service: General;  Laterality: N/A;    Medical History: Past Medical History:  Diagnosis Date   AKI (acute kidney injury) (HCC) 07/12/2017   Asthma    COPD (chronic obstructive pulmonary disease) (HCC)    COPD exacerbation (HCC) 12/29/2016   COPD with acute exacerbation (HCC) 10/02/2022   DM (diabetes mellitus), type 2 (HCC)    Dyspnea    Fibromyalgia    Guillain Barr syndrome (HCC)    Headache    Hyperlipidemia    Hypertension    Hypothyroidism    Neurofibromatosis, peripheral, NF1 (HCC) 2017   Pancreatitis    PONV (postoperative nausea and vomiting)    Pulmonary nodule    RLS (restless legs syndrome)    Sleep apnea    never went to pick up her cpap   Tachycardia    Tracheomalacia     Family History: Non contributory to the present illness  Social  History: Social History   Socioeconomic History   Marital status: Divorced    Spouse name: Not on file   Number of children: Not on file   Years of education: Not on file   Highest education level: Not on file  Occupational History   Not on file  Tobacco Use   Smoking status: Former    Current packs/day: 0.00    Average packs/day: 1 pack/day for 15.0 years (15.0 ttl pk-yrs)    Types: Cigarettes    Start date: 12/29/1982    Quit date: 12/28/1997    Years since quitting: 25.1   Smokeless tobacco: Never  Vaping Use   Vaping status: Never Used  Substance and Sexual Activity   Alcohol use: Not Currently    Alcohol/week: 0.0 standard drinks of alcohol   Drug use: No   Sexual activity: Not Currently    Birth control/protection: Surgical  Other Topics Concern   Not on file  Social History Narrative   Not on file   Social Determinants of Health   Financial Resource Strain: Medium Risk (02/06/2023)   Received from [facility]   Overall Financial Resource Strain (CARDIA)    Difficulty of Paying Living Expenses: Somewhat hard  Food Insecurity: No Food Insecurity (02/06/2023)   [facility]   Hunger Vital Sign    Worried About Running Out of Food in the Last Year: Never true    Ran Out of Food in the Last Year: Never true  Transportation Needs: No Transportation Needs (02/06/2023)   Received from [facility] - Transportation    In the past 12 months, has lack of transportation kept you from medical appointments or from getting medications?: No    Lack of Transportation (Non-Medical): No  Physical Activity: Insufficiently Active (09/16/2017)   Exercise Vital Sign    Days of Exercise per Week: 1 day    Minutes of Exercise per Session: 10 min  Stress: Stress Concern Present (09/16/2017)   [employer] of Occupational [employer] - Occupational Stress Questionnaire    Feeling of Stress : Rather much  Social  Connections: Unknown (09/16/2017)   Social Connection and Isolation Panel [NHANES]    Frequency of Communication with Friends and Family: Patient declined    Frequency of Social Gatherings with Friends and Family: Patient declined    Attends Religious Services: Patient declined    Database administrator or Organizations: Patient declined    Attends Banker Meetings: Patient declined    Marital Status: Patient declined  Intimate Partner Violence: Not At Risk (04/10/2022)   Humiliation, Afraid, Rape, and Kick questionnaire    Fear of Current or Ex-Partner: No    Emotionally Abused: No    Physically Abused: No    Sexually Abused: No    Vital Signs: Blood pressure 116/79, pulse 79, resp. rate 18, height 5\' 5"  (1.651 m), weight 187 lb (84.8 kg), SpO2 97%. Body mass index is 31.12 kg/m.   Examination: General Appearance: The patient is well-developed, well-nourished, and in no distress. Neck Circumference: 40 cm Skin: Gross inspection of skin unremarkable. Head: normocephalic, no gross deformities. Eyes: no gross deformities noted. ENT: ears appear grossly normal Neurologic: Alert and oriented. No involuntary movements.    STOP BANG RISK ASSESSMENT S (snore) Have you been told that you snore?     YES   T (tired) Are you often tired, fatigued, or sleepy during the day?   YES  O (obstruction) Do you stop breathing, choke, or gasp during sleep? YES   P (pressure) Do you have or are you being treated for high blood pressure? NO   B (BMI) Is your body index greater than 35 kg/m? NO   A (age) Are you 96 years old or older? YES   N (neck) Do you have a neck circumference greater than 16 inches?   NO   G (gender) Are you a female? NO   TOTAL STOP/BANG "YES" ANSWERS 4                                                               A STOP-Bang score of 2 or less is considered low risk, and a score of  5 or more is high risk for having either moderate or severe OSA. For  people who score 3 or 4, doctors may need to perform further assessment to determine how likely they are to have OSA.         EPWORTH SLEEPINESS SCALE:  Scale:  (0)= no chance of dozing; (1)= slight chance of dozing; (2)= moderate chance of dozing; (3)= high chance of dozing  Chance  Situtation    Sitting and reading: 3    Watching TV: 3    Sitting Inactive in public: 1    As a passenger in car: 1      Lying down to rest: 3    Sitting and talking: 1    Sitting quielty after lunch: 3    In a car, stopped in traffic: 1   TOTAL SCORE:   16 out of 24    SLEEP STUDIES:  PSG (12/2020) AHI 10.4/Hr, REM AHI 26/hr, min SPO2 84% Titration (12/2020) CPAP@ 8 cmH2O   LABS: Recent Results (from the past 2160 hour(s))  CBC with Differential     Status: Abnormal   Collection Time: 12/11/22 11:28 PM  Result Value Ref Range   WBC 9.4 4.0 - 10.5 K/uL   RBC 5.29 (H) 3.87 - 5.11 MIL/uL   Hemoglobin 14.9 12.0 - 15.0 g/dL   HCT 63.8 75.6 - 43.3 %   MCV 86.0 80.0 - 100.0 fL   MCH 28.2 26.0 - 34.0 pg   MCHC 32.7 30.0 - 36.0 g/dL   RDW 29.5 18.8 - 41.6 %   Platelets 297 150 - 400 K/uL   nRBC 0.0 0.0 - 0.2 %   Neutrophils Relative % 64 %   Neutro Abs 6.0 1.7 - 7.7 K/uL   Lymphocytes Relative 23 %   Lymphs Abs 2.2 0.7 - 4.0 K/uL   Monocytes Relative 6 %   Monocytes Absolute 0.6 0.1 - 1.0 K/uL   Eosinophils Relative 6 %   Eosinophils Absolute 0.5 0.0 - 0.5 K/uL   Basophils Relative 1 %   Basophils Absolute 0.1 0.0 - 0.1 K/uL   Immature Granulocytes 0 %   Abs Immature Granulocytes 0.04 0.00 - 0.07 K/uL    Comment: Performed at [facility], [address] [place], [place] [address]  Comprehensive metabolic panel     Status: Abnormal   Collection Time: 12/11/22 11:28 PM  Result Value Ref Range   Sodium 140 135 - 145 mmol/L   Potassium 3.9 3.5 - 5.1 mmol/L   Chloride 109 98 - 111 mmol/L   CO2 26 22 - 32 mmol/L   Glucose, Bld 104 (H) 70 - 99 mg/dL    Comment:  Glucose reference range applies only to samples taken after fasting for at least 8 hours.   BUN 24 (H) 6 - 20 mg/dL   Creatinine, Ser 1.60 0.44 - 1.00 mg/dL   Calcium 8.9 8.9 - 10.9 mg/dL   Total Protein 7.2 6.5 - 8.1 g/dL   Albumin 3.5 3.5 - 5.0 g/dL   AST 12 (L) 15 - 41 U/L   ALT 13 0 - 44 U/L   Alkaline Phosphatase 80 38 - 126 U/L   Total Bilirubin 0.7 0.3 - 1.2 mg/dL   GFR, Estimated >32 >35 mL/min    Comment: (NOTE) Calculated using the CKD-EPI Creatinine Equation (2021)    Anion gap 5 5 - 15    Comment: Performed at [facility], [address], [place], [place] [address]  Lipase,  blood     Status: None   Collection Time: 12/11/22 11:28 PM  Result Value Ref Range   Lipase 24 11 - 51 U/L    Comment: Performed at [facility], [address] [place], [place] [address]  Urinalysis, Routine w reflex microscopic -Urine, Clean Catch     Status: Abnormal   Collection Time: 12/11/22 11:28 PM  Result Value Ref Range   Color, Urine YELLOW (A) YELLOW   APPearance HAZY (A) CLEAR   Specific Gravity, Urine 1.031 (H) 1.005 - 1.030   pH 5.0 5.0 - 8.0   Glucose, UA NEGATIVE NEGATIVE mg/dL   Hgb urine dipstick NEGATIVE NEGATIVE   Bilirubin Urine SMALL (A) NEGATIVE   Ketones, ur NEGATIVE NEGATIVE mg/dL   Protein, ur NEGATIVE NEGATIVE mg/dL   Nitrite NEGATIVE NEGATIVE   Leukocytes,Ua MODERATE (A) NEGATIVE   RBC / HPF 0-5 0 - 5 RBC/hpf   WBC, UA 0-5 0 - 5 WBC/hpf   Bacteria, UA NONE SEEN NONE SEEN   Squamous Epithelial / HPF 0-5 0 - 5 /HPF   Mucus PRESENT    Hyaline Casts, UA PRESENT     Comment: Performed at [facility], [address] [place], [place] [address]  Troponin I (High Sensitivity)     Status: None   Collection Time: 12/11/22 11:28 PM  Result Value Ref Range   Troponin I (High Sensitivity) <2 <18 ng/L    Comment: (NOTE) Elevated high sensitivity troponin I (hsTnI) values and significant  changes across serial measurements may suggest ACS  but many other  chronic and acute conditions are known to elevate hsTnI results.  Refer to the "Links" section for chest pain algorithms and additional  guidance. Performed at [facility], [address] [place], [place] [address]     Radiology: CT ABDOMEN PELVIS W CONTRAST  Result Date: 12/12/2022 CLINICAL DATA:  Upper abdominal pain EXAM: CT ABDOMEN AND PELVIS WITH CONTRAST TECHNIQUE: Multidetector CT imaging of the abdomen and pelvis was performed using the standard protocol following bolus administration of intravenous contrast. RADIATION DOSE REDUCTION: This exam was performed according to the departmental dose-optimization program which includes automated exposure control, adjustment of the mA and/or kV according to patient size and/or use of iterative reconstruction technique. CONTRAST:  OMNIPAQUE IOHEXOL 300 MG/ML  SOLN COMPARISON:  PET/CT 07/24/2022 and CT abdomen and pelvis 07/06/2022 FINDINGS: Lower chest: No acute abnormality. Hepatobiliary: Hemangioma in the right hepatic lobe. Cholecystectomy. Pancreas: Fatty atrophy without acute abnormality. Spleen: Unremarkable. Adrenals/Urinary Tract: Normal adrenal glands. No urinary calculi or hydronephrosis. Bladder is unremarkable. Stomach/Bowel: Normal caliber large and small bowel. Moderate colonic stool load. No bowel wall thickening. The appendix is not visualized.Stomach is within normal limits. Vascular/Lymphatic: No significant vascular findings are present. No enlarged abdominal or pelvic lymph nodes. Reproductive: Hysterectomy. Other: No free intraperitoneal fluid or air. Musculoskeletal: No acute fracture. IMPRESSION: 1. No acute abnormality in the abdomen or pelvis. 2. Moderate colonic stool load in the transverse colon. Electronically Signed   By: Minerva Fester M.D.   On: 12/12/2022 02:39    No results found.  No results found.    Assessment and Plan: Patient Active Problem List   Diagnosis Date Noted    Type 2 diabetes mellitus without complication (HCC) 01/16/2023   Chronic pain 10/02/2022   Viral pneumonia 04/11/2022   Obesity (BMI 30-39.9) 04/11/2022   CAP (community acquired pneumonia) 04/10/2022   Acute respiratory failure with hypoxia (HCC) 07/17/2020   Chest wall pain  07/17/2020   Abdominal pain 04/07/2020   Adaptation reaction 07/14/2019   Allergic rhinitis, seasonal 07/14/2019   Breast lump 07/14/2019   Cephalalgia 07/14/2019   Chronic pain associated with significant psychosocial dysfunction 07/14/2019   Encounter for screening for lipoid disorders 07/14/2019   Feeling bilious 07/14/2019   Feeling stressed out 07/14/2019   Immunization, tetanus toxoid 07/14/2019   Infection of the upper respiratory tract 07/14/2019   Urethra, 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   Uterine spasm 07/14/2019   Breath shortness 07/14/2019   Breathlessness on exertion 07/14/2019   SOB (shortness of breath)    Severe sepsis (HCC) 07/10/2019   Easy bruising 07/02/2019   Polycythemia 07/02/2019   Tremor 05/29/2019   Cognitive decline 09/29/2018   Acute hypoxic respiratory failure (HCC) 08/28/2018   Seizures (HCC) 01/15/2018   Leg pain 10/18/2017   Lower extremity weakness 10/18/2017   Weakness 09/16/2017   Lower extremity pain, bilateral 09/10/2017   Other symptoms and signs involving the musculoskeletal system 07/26/2017   HTN (hypertension) 12/29/2016   Substance induced mood disorder (HCC) 12/29/2016   Alcohol use disorder, severe, dependence (HCC) 12/27/2016   Major depressive disorder, 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   Vomiting of fecal matter 08/15/2016   Globus sensation 08/15/2016   Multiple somatic complaints 08/15/2016   Multiple duodenal ulcers 01/24/2016   Prediabetes 07/07/2015   Neurofibromatosis, 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   Acquired hypothyroidism 04/04/2015   Pre-diabetes 04/04/2015   Vitamin D deficiency 04/04/2015   Borderline diabetes mellitus  04/04/2015   Anxiety 10/12/2014   Anxiety, generalized 10/12/2014   H/O: hypothyroidism 10/12/2014   Insomnia, persistent 10/12/2014   Cannot sleep 10/12/2014   Depression, major, recurrent, moderate (HCC) 10/12/2014   Drug abuse, opioid type (HCC) 10/12/2014   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) 10/12/2014   Generalized anxiety disorder 10/12/2014   Moderate episode of recurrent major depressive disorder (HCC) 10/12/2014   Opioid abuse (HCC) 10/12/2014   History of hypothyroidism 10/12/2014   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   Severe protein-calorie malnutrition (HCC) 07/30/2014   Nausea & vomiting 07/07/2014   Hypothyroidism, unspecified 07/07/2014   Depression with anxiety 07/07/2014   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/2013   Lipoma of abdominal wall 09/15/2013   Pleurisy 08/17/2010   Vomiting and diarrhea 01/23/2010   Cough 11/24/2009   Postmenopausal atrophic vaginitis 11/21/2009   Lumbar back sprain 10/10/2009   Clinical depression 07/12/2009   Arthralgia of multiple joints 06/30/2009   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 OSA (obstructive sleep apnea) PLAN OSA:   Patient evaluation suggests high risk of sleep disordered breathing due to history of OSA (PSG in 2022 showed AHI of 10 as noted above) with ongoing hypoxia while in hospital, observed apnea, snoring, gasping, choking, daytime sleepiness.  Patient has comorbid cardiovascular risk factors including: hypertension which could be exacerbated by pathologic sleep-disordered breathing.  Suggest: Split  study to assess/treat the patient's sleep disordered breathing. The patient was also counselled on weight loss to optimize sleep health.  2. Tracheobronchomalacia Being evaluated for possible surgery. F/u with specialist as scheduled.  General Counseling: I have discussed the findings of the evaluation and examination with Jadelin.  I have also discussed any further diagnostic evaluation thatmay be needed or ordered today. Canela verbalizes understanding of the findings of todays visit. We also reviewed her medications today and discussed drug interactions and side effects including but not limited excessive drowsiness and altered mental states. We also discussed that there is always a risk not just to her but also people around her. she has been encouraged to call the office with any questions or concerns that should arise related to todays visit.  No orders of the defined types were placed in this encounter.       I have personally obtained a history, evaluated the patient, evaluated pertinent data, formulated the assessment and plan and placed orders.   This patient was seen today by Emmaline Kluver, PA-C in collaboration with Dr. Freda Munro.   Yevonne Pax, MD [facility] Diplomate ABMS Pulmonary and Critical Care Medicine Sleep medicine

## 2023-02-18 ENCOUNTER — Ambulatory Visit (INDEPENDENT_AMBULATORY_CARE_PROVIDER_SITE_OTHER): Payer: Medicaid Other | Admitting: Internal Medicine

## 2023-02-18 VITALS — BP 116/79 | HR 79 | Resp 18 | Ht 65.0 in | Wt 187.0 lb

## 2023-02-18 DIAGNOSIS — G4733 Obstructive sleep apnea (adult) (pediatric): Secondary | ICD-10-CM | POA: Diagnosis not present

## 2023-02-18 DIAGNOSIS — J398 Other specified diseases of upper respiratory tract: Secondary | ICD-10-CM | POA: Insufficient documentation

## 2023-02-26 ENCOUNTER — Emergency Department: Payer: Medicaid Other

## 2023-02-26 ENCOUNTER — Emergency Department
Admission: EM | Admit: 2023-02-26 | Discharge: 2023-02-26 | Disposition: A | Discharge status: Home / Self Care | Payer: Medicaid Other | Attending: Emergency Medicine | Admitting: Emergency Medicine

## 2023-02-26 ENCOUNTER — Other Ambulatory Visit: Payer: Self-pay

## 2023-02-26 ENCOUNTER — Encounter: Payer: Self-pay | Admitting: Emergency Medicine

## 2023-02-26 DIAGNOSIS — J449 Chronic obstructive pulmonary disease, unspecified: Secondary | ICD-10-CM | POA: Diagnosis not present

## 2023-02-26 DIAGNOSIS — I10 Essential (primary) hypertension: Secondary | ICD-10-CM | POA: Insufficient documentation

## 2023-02-26 DIAGNOSIS — R300 Dysuria: Secondary | ICD-10-CM | POA: Insufficient documentation

## 2023-02-26 DIAGNOSIS — E119 Type 2 diabetes mellitus without complications: Secondary | ICD-10-CM | POA: Insufficient documentation

## 2023-02-26 DIAGNOSIS — R1032 Left lower quadrant pain: Secondary | ICD-10-CM | POA: Diagnosis present

## 2023-02-26 DIAGNOSIS — R109 Unspecified abdominal pain: Secondary | ICD-10-CM

## 2023-02-26 LAB — COMPREHENSIVE METABOLIC PANEL
ALT: 18 U/L (ref 0–44)
AST: 18 U/L (ref 15–41)
Albumin: 4.5 g/dL (ref 3.5–5.0)
Alkaline Phosphatase: 98 U/L (ref 38–126)
Anion gap: 10 (ref 5–15)
BUN: 18 mg/dL (ref 6–20)
CO2: 21 mmol/L — ABNORMAL LOW (ref 22–32)
Calcium: 9.3 mg/dL (ref 8.9–10.3)
Chloride: 104 mmol/L (ref 98–111)
Creatinine, Ser: 0.83 mg/dL (ref 0.44–1.00)
GFR, Estimated: >60 mL/min (ref >60)
Glucose, Bld: 103 mg/dL — ABNORMAL HIGH (ref 70–99)
Potassium: 4.2 mmol/L (ref 3.5–5.1)
Sodium: 135 mmol/L (ref 135–145)
Total Bilirubin: 0.7 mg/dL (ref <1.2)
Total Protein: 7.6 g/dL (ref 6.5–8.1)

## 2023-02-26 LAB — CBC
HCT: 49.3 % — ABNORMAL HIGH (ref 36.0–46.0)
Hemoglobin: 16.2 g/dL — ABNORMAL HIGH (ref 12.0–15.0)
MCH: 27.7 pg (ref 26.0–34.0)
MCHC: 32.9 g/dL (ref 30.0–36.0)
MCV: 84.3 fL (ref 80.0–100.0)
Platelets: 284 10*3/uL (ref 150–400)
RBC: 5.85 MIL/uL — ABNORMAL HIGH (ref 3.87–5.11)
RDW: 13.6 % (ref 11.5–15.5)
WBC: 8.3 10*3/uL (ref 4.0–10.5)
nRBC: 0 % (ref 0.0–0.2)

## 2023-02-26 LAB — URINALYSIS, ROUTINE W REFLEX MICROSCOPIC
Bilirubin Urine: NEGATIVE
Glucose, UA: NEGATIVE mg/dL
Hgb urine dipstick: NEGATIVE
Ketones, ur: NEGATIVE mg/dL
Nitrite: NEGATIVE
Protein, ur: NEGATIVE mg/dL
Specific Gravity, Urine: 1.032 — ABNORMAL HIGH (ref 1.005–1.030)
pH: 5 (ref 5.0–8.0)

## 2023-02-26 LAB — WET PREP, GENITAL
Clue Cells Wet Prep HPF POC: NONE SEEN
Sperm: NONE SEEN
Trich, Wet Prep: NONE SEEN
WBC, Wet Prep HPF POC: <10 (ref <10)
Yeast Wet Prep HPF POC: NONE SEEN

## 2023-02-26 LAB — LIPASE, BLOOD: Lipase: 19 U/L (ref 11–51)

## 2023-02-26 MED ORDER — MORPHINE SULFATE (PF) 4 MG/ML IV SOLN
4.0000 mg | Freq: Once | INTRAVENOUS | Status: AC
  Administered 2023-02-26: 4 mg via INTRAVENOUS
  Filled 2023-02-26: qty 1

## 2023-02-26 MED ORDER — ONDANSETRON HCL 4 MG/2ML IJ SOLN
4.0000 mg | Freq: Once | INTRAMUSCULAR | Status: AC
  Administered 2023-02-26: 4 mg via INTRAVENOUS
  Filled 2023-02-26: qty 2

## 2023-02-26 MED ORDER — OXYCODONE-ACETAMINOPHEN 5-325 MG PO TABS: 1.0000 | ORAL_TABLET | Freq: Once | ORAL | Status: DC

## 2023-02-26 MED ORDER — IOHEXOL 300 MG/ML  SOLN
100.0000 mL | Freq: Once | INTRAMUSCULAR | Status: AC | PRN
Start: 2023-02-26 — End: 2023-02-26
  Administered 2023-02-26: 100 mL via INTRAVENOUS

## 2023-02-26 MED ORDER — HYDROCODONE-ACETAMINOPHEN 5-325 MG PO TABS: 1.0000 | ORAL_TABLET | ORAL | 0 refills | Status: DC | PRN

## 2023-02-26 NOTE — ED Triage Notes (Signed)
Pt sts that she has been having lowe abd pain. Pt sts that she was over at the walk in clinic and they took her urine and said it was fine, however they advised her to come to the ED.

## 2023-02-26 NOTE — ED Provider Notes (Signed)
-----------------------------------------   3:12 PM on 02/26/2023 -----------------------------------------  Blood pressure 139/89, pulse 98, temperature 98.1 F (36.7 C), temperature source Oral, resp. rate 17, height 5\' 5"  (1.651 m), weight 81.6 kg, SpO2 93%.  Assuming care from Dr. Lenard Lance.  In short, Jenna Tyler is a 57 y.o. female with a chief complaint of Abdominal Pain .  Refer to the original H&P for additional details.  The current plan of care is to follow-up CT abdomen results.  ----------------------------------------- 4:49 PM on 02/26/2023 ----------------------------------------- CT of abdomen/pelvis is unremarkable, given otherwise reassuring workup patient is appropriate for discharge home with PCP follow-up.  She was prescribed pain medication per Dr. Lenard Lance, was counseled to return to the ED for new or worsening symptoms.  Patient agrees with plan.    Chesley Noon, MD 02/26/23 [phone]

## 2023-02-26 NOTE — ED Provider Notes (Signed)
[facility] Provider Note    Event Date/Time   First MD Initiated Contact with Patient 02/26/23 1207     (approximate)  History   Chief Complaint: Abdominal Pain  HPI  Jenna Tyler is a 57 y.o. female with a past medical history of COPD, diabetes, fibromyalgia, hypertension, hyperlipidemia, presents to the emergency department for left lower quadrant abdominal pain.  According to the patient for the past 1 week she has been experiencing left lower quadrant abdominal pain as well as some mild dysuria/burning with urination.  Patient states he went to the urgent care thinking she could have a urinary tract infection however they checked a urinalysis and it was normal they told her to go to the emergency department for further workup.  Patient denies any fever.  Denies any nausea vomiting or diarrhea.  Denies any vaginal bleeding or discharge.  Physical Exam   Triage Vital Signs: ED Triage Vitals [02/26/23 1152]  Encounter Vitals Group     BP 139/89     Systolic BP Percentile      Diastolic BP Percentile      Pulse Rate 98     Resp 17     Temp 98.1 F (36.7 C)     Temp Source Oral     SpO2 93 %     Weight 180 lb (81.6 kg)     Height 5\' 5"  (1.651 m)     Head Circumference      Peak Flow      Pain Score 7     Pain Loc      Pain Education      Exclude from Growth Chart     Most recent vital signs: Vitals:   02/26/23 1152  BP: 139/89  Pulse: 98  Resp: 17  Temp: 98.1 F (36.7 C)  SpO2: 93%    General: Awake, no distress.  CV:  Good peripheral perfusion.  Regular rate and rhythm  Resp:  Normal effort.  Equal breath sounds bilaterally.  Abd:  No distention.  Soft, mild left lower quadrant tenderness to palpation and suprapubic tenderness.  No rebound or guarding.   ED Results / Procedures / Treatments   RADIOLOGY I have reviewed interpret the CT images.  No obvious obstruction or significant abnormality to my evaluation. CT scan read  is negative.   MEDICATIONS ORDERED IN ED: Medications  morphine (PF) 4 MG/ML injection 4 mg (has no administration in time range)  ondansetron (ZOFRAN) injection 4 mg (has no administration in time range)     IMPRESSION / MDM / ASSESSMENT AND PLAN / ED COURSE  I reviewed the triage vital signs and the nursing notes.  Patient's presentation is most consistent with acute presentation with potential threat to life or bodily function.  Patient presents emergency department for left lower quadrant/suprapubic abdominal pain over the past 1 week.  Overall the patient appears well, no distress.  Mild tenderness to palpation in this area.  Patient also states some slight dysuria.  We will check labs including blood work and a urinalysis.  Given the patient's dysuria with a negative urinalysis at urgent care we will also obtain a wet prep.  Given the patient's mild left lower quadrant tenderness we will obtain CT imaging of the abdomen/pelvis.  We will treat pain and nausea while awaiting CT and lab results.  Patient agreeable to plan of care.  Labs show reassuring CBC with a normal white blood cell count, reassuring chemistry, normal wet  prep, negative lipase, reassuring urinalysis.  CT scan is resulted negative.  We will discharge home with outpatient follow-up.  FINAL CLINICAL IMPRESSION(S) / ED DIAGNOSES   Left lower quadrant abdominal pain  Note:  This document was prepared using Dragon voice recognition software and may include unintentional dictation errors.   Minna Antis, MD 03/02/23 [phone]

## 2023-02-27 ENCOUNTER — Other Ambulatory Visit: Payer: Self-pay | Admitting: Family Medicine

## 2023-02-27 DIAGNOSIS — Z1231 Encounter for screening mammogram for malignant neoplasm of breast: Secondary | ICD-10-CM

## 2023-03-12 ENCOUNTER — Other Ambulatory Visit: Payer: Self-pay | Admitting: Family Medicine

## 2023-03-12 ENCOUNTER — Encounter: Payer: Self-pay | Admitting: Oncology

## 2023-03-12 DIAGNOSIS — N6311 Unspecified lump in the right breast, upper outer quadrant: Secondary | ICD-10-CM

## 2023-03-19 ENCOUNTER — Ambulatory Visit
Admission: RE | Admit: 2023-03-19 | Discharge: 2023-03-19 | Disposition: A | Discharge status: Home / Self Care | Payer: Medicaid Other | Source: Ambulatory Visit | Attending: Family Medicine | Admitting: Family Medicine

## 2023-03-19 DIAGNOSIS — N6311 Unspecified lump in the right breast, upper outer quadrant: Secondary | ICD-10-CM | POA: Diagnosis present

## 2023-04-01 ENCOUNTER — Other Ambulatory Visit: Payer: Self-pay

## 2023-04-01 ENCOUNTER — Encounter: Payer: Medicaid Other | Attending: Thoracic Surgery

## 2023-04-01 DIAGNOSIS — Z5189 Encounter for other specified aftercare: Secondary | ICD-10-CM | POA: Insufficient documentation

## 2023-04-01 DIAGNOSIS — Z87891 Personal history of nicotine dependence: Secondary | ICD-10-CM | POA: Insufficient documentation

## 2023-04-01 DIAGNOSIS — J449 Chronic obstructive pulmonary disease, unspecified: Secondary | ICD-10-CM | POA: Insufficient documentation

## 2023-04-01 NOTE — Progress Notes (Signed)
Virtual Visit completed. Patient informed on EP and RD appointment and 6 Minute walk test. Patient also informed of patient health questionnaires on My Chart. Patient Verbalizes understanding. Visit diagnosis can be found in [facility] 01/13/2023.

## 2023-04-02 VITALS — Ht 65.8 in | Wt 187.1 lb

## 2023-04-02 DIAGNOSIS — J449 Chronic obstructive pulmonary disease, unspecified: Secondary | ICD-10-CM

## 2023-04-02 DIAGNOSIS — Z5189 Encounter for other specified aftercare: Secondary | ICD-10-CM | POA: Diagnosis not present

## 2023-04-02 DIAGNOSIS — Z87891 Personal history of nicotine dependence: Secondary | ICD-10-CM | POA: Diagnosis not present

## 2023-04-02 NOTE — Progress Notes (Signed)
Pulmonary Individual Treatment Plan  Patient Details  Name: Jenna Tyler MRN: [id number] Date of Birth: [birth year] Referring Provider:   Flowsheet Row Pulmonary Rehab from 04/02/2023 in [facility] Cardiac and Pulmonary Rehab  Referring Provider Dr. Alphonzo Dublin       Initial Encounter Date:  Flowsheet Row Pulmonary Rehab from 04/02/2023 in [facility] Cardiac and Pulmonary Rehab  Date 04/02/23       Visit Diagnosis: Chronic obstructive pulmonary disease, unspecified COPD type (HCC)  Patient's Home Medications on Admission:  Current Outpatient Medications:    albuterol (PROVENTIL) (2.5 MG/3ML) 0.083% nebulizer solution, Inhale into the lungs., Disp: , Rfl:    albuterol (VENTOLIN HFA) 108 (90 Base) MCG/ACT inhaler, Inhale 1-2 puffs into the lungs every 4 (four) hours as needed. (Patient not taking: Reported on 04/01/2023), Disp: , Rfl:    Blood Glucose Monitoring Suppl (GLUCOCOM BLOOD GLUCOSE MONITOR) DEVI, 1 each as directed, Disp: , Rfl:    budesonide (PULMICORT) 0.25 MG/2ML nebulizer solution, Inhale into the lungs., Disp: , Rfl:    butalbital-acetaminophen-caffeine (FIORICET) 50-325-40 MG tablet, Take 1 tablet by mouth every 4 (four) hours as needed for headache., Disp: 14 tablet, Rfl: 0   chlorpheniramine-HYDROcodone (TUSSIONEX) 10-8 MG/5ML, Take 5 mLs by mouth every 12 (twelve) hours., Disp: 70 mL, Rfl: 0   Cholecalciferol 50 MCG (2000 UT) TABS, Take by mouth. (Patient not taking: Reported on 04/01/2023), Disp: , Rfl:    Cysteamine Bitartrate (PROCYSBI) 300 MG PACK, Use 1 each once daily Use as instructed., Disp: , Rfl:    dicyclomine (BENTYL) 20 MG tablet, Take 1 tablet (20 mg total) by mouth every 8 (eight) hours as needed., Disp: 15 tablet, Rfl: 0   doxycycline (VIBRAMYCIN) 100 MG capsule, SMARTSIG:1.0 Capsule(s) By Mouth Twice Daily, Disp: , Rfl:    gabapentin (NEURONTIN) 800 MG tablet, Take 800 mg by mouth 2 (two) times daily., Disp: , Rfl:    glucose blood (ACCU-CHEK GUIDE  TEST) test strip, 1 each (1 strip total) once daily Use as instructed., Disp: , Rfl:    HYDROcodone-acetaminophen (NORCO/VICODIN) 5-325 MG tablet, Take 1 tablet by mouth every 4 (four) hours as needed., Disp: 15 tablet, Rfl: 0   hydrOXYzine (ATARAX) 25 MG tablet, Take by mouth. (Patient not taking: Reported on 04/01/2023), Disp: , Rfl:    ibuprofen (ADVIL) 200 MG tablet, Take by mouth., Disp: , Rfl:    levothyroxine (SYNTHROID) 100 MCG tablet, Take 100 mcg by mouth daily., Disp: , Rfl:    meclizine (ANTIVERT) 25 MG tablet, Take 25mg  1-3 times daily as needed (Patient not taking: Reported on 04/01/2023), Disp: , Rfl:    metFORMIN (GLUCOPHAGE) 500 MG tablet, Take 500 mg by mouth 2 (two) times daily. (Patient not taking: Reported on 04/01/2023), Disp: , Rfl:    methocarbamol (ROBAXIN) 750 MG tablet, Take 750 mg by mouth 2 (two) times daily. (Patient not taking: Reported on 04/01/2023), Disp: , Rfl:    metoprolol tartrate (LOPRESSOR) 25 MG tablet, Take 25 mg by mouth every morning., Disp: , Rfl:    Multiple Vitamin (MULTIVITAMIN WITH MINERALS) TABS tablet, Take 1 tablet by mouth daily. (Patient not taking: Reported on 04/01/2023), Disp: , Rfl:    nortriptyline (PAMELOR) 10 MG capsule, , Disp: , Rfl:    omeprazole (PRILOSEC) 40 MG capsule, Take by mouth., Disp: , Rfl:    ondansetron (ZOFRAN) 4 MG tablet, Take 8 mg by mouth 2 (two) times daily., Disp: , Rfl:    ondansetron (ZOFRAN-ODT) 4 MG disintegrating tablet, Take 1 tablet (  4 mg total) by mouth every 6 (six) hours as needed for nausea or vomiting. (Patient not taking: Reported on 04/01/2023), Disp: 20 tablet, Rfl: 0   polyethylene glycol (MIRALAX) 17 g packet, Take 17 g by mouth daily. (Patient not taking: Reported on 04/01/2023), Disp: 30 each, Rfl: 0   rOPINIRole (REQUIP) 0.5 MG tablet, Take 1 mg by mouth 2 (two) times daily., Disp: , Rfl:    SYMBICORT 160-4.5 MCG/ACT inhaler, Inhale 2 puffs into the lungs 2 (two) times daily., Disp: , Rfl:   Past Medical  History: Past Medical History:  Diagnosis Date   AKI (acute kidney injury) (HCC) 07/12/2017   Asthma    COPD (chronic obstructive pulmonary disease) (HCC)    COPD exacerbation (HCC) 12/29/2016   COPD with acute exacerbation (HCC) 10/02/2022   DM (diabetes mellitus), type 2 (HCC)    Dyspnea    Fibromyalgia    Guillain Barr syndrome (HCC)    Headache    Hyperlipidemia    Hypertension    Hypothyroidism    Neurofibromatosis, peripheral, NF1 (HCC) 2017   Pancreatitis    PONV (postoperative nausea and vomiting)    Pulmonary nodule    RLS (restless legs syndrome)    Sleep apnea    never went to pick up her cpap   Tachycardia    Tracheomalacia     Tobacco Use: Social History   Tobacco Use  Smoking Status Former   Current packs/day: 0.00   Average packs/day: 1 pack/day for 15.0 years (15.0 ttl pk-yrs)   Types: Cigarettes   Start date: 12/29/1982   Quit date: 12/28/1997   Years since quitting: 25.2  Smokeless Tobacco Never    Labs: Review Flowsheet  More data may exist      Latest Ref Rng & Units 06/27/2015 12/29/2016 07/09/2019 07/10/2019 10/04/2021  Labs for ITP Cardiac and Pulmonary Rehab  Cholestrol 0 - 200 mg/dL - 846  - - -  LDL (calc) 0 - 99 mg/dL - 962  - - -  HDL-C >95 mg/dL - 47  - - -  Trlycerides <150 mg/dL - 284  - - -  Hemoglobin A1c 4.8 - 5.6 % 5.9  5.7  - 6.3  6.2   PH, Arterial 7.350 - 7.450 - - 7.39  - -  PCO2 arterial 32.0 - 48.0 mmHg - - 30  - -  Bicarbonate 20.0 - 28.0 mmol/L - - 18.2  - -  Acid-base deficit 0.0 - 2.0 mmol/L - - 5.6  - -  O2 Saturation % - - 97.7  - -    Details             Pulmonary Assessment Scores:   UCSD: Self-administered rating of dyspnea associated with activities of daily living (ADLs) 6-point scale (0 = "not at all" to 5 = "maximal or unable to do because of breathlessness")  Scoring Scores range from 0 to 120.  Minimally important difference is 5 units  CAT: CAT can identify the health impairment of COPD patients  and is better correlated with disease progression.  CAT has a scoring range of zero to 40. The CAT score is classified into four groups of low (less than 10), medium (10 - 20), high (21-30) and very high (31-40) based on the impact level of disease on health status. A CAT score over 10 suggests significant symptoms.  A worsening CAT score could be explained by an exacerbation, poor medication adherence, poor inhaler technique, or progression of COPD or comorbid  conditions.  CAT MCID is 2 points  mMRC: mMRC (Modified Medical Research Council) Dyspnea Scale is used to assess the degree of baseline functional disability in patients of respiratory disease due to dyspnea. No minimal important difference is established. A decrease in score of 1 point or greater is considered a positive change.   Pulmonary Function Assessment:  Pulmonary Function Assessment - 04/01/23 0946       Breath   Shortness of Breath Yes;Limiting activity             Exercise Target Goals: Exercise Program Goal: Individual exercise prescription set using results from initial 6 min walk test and THRR while considering  patient's activity barriers and safety.   Exercise Prescription Goal: Initial exercise prescription builds to 30-45 minutes a day of aerobic activity, 2-3 days per week.  Home exercise guidelines will be given to patient during program as part of exercise prescription that the participant will acknowledge.  Education: Aerobic Exercise: - Group verbal and visual presentation on the components of exercise prescription. Introduces F.I.T.T principle from ACSM for exercise prescriptions.  Reviews F.I.T.T. principles of aerobic exercise including progression. Written material given at graduation.   Education: Resistance Exercise: - Group verbal and visual presentation on the components of exercise prescription. Introduces F.I.T.T principle from ACSM for exercise prescriptions  Reviews F.I.T.T. principles of  resistance exercise including progression. Written material given at graduation.    Education: Exercise & Equipment Safety: - Individual verbal instruction and demonstration of equipment use and safety with use of the equipment. Flowsheet Row Pulmonary Rehab from 04/01/2023 in [facility] Cardiac and Pulmonary Rehab  Date 04/01/23  Educator jh  Instruction Review Code 1- Verbalizes Understanding       Education: Exercise Physiology & General Exercise Guidelines: - Group verbal and written instruction with models to review the exercise physiology of the cardiovascular system and associated critical values. Provides general exercise guidelines with specific guidelines to those with heart or lung disease.    Education: Flexibility, Balance, Mind/Body Relaxation: - Group verbal and visual presentation with interactive activity on the components of exercise prescription. Introduces F.I.T.T principle from ACSM for exercise prescriptions. Reviews F.I.T.T. principles of flexibility and balance exercise training including progression. Also discusses the mind body connection.  Reviews various relaxation techniques to help reduce and manage stress (i.e. Deep breathing, progressive muscle relaxation, and visualization). Balance handout provided to take home. Written material given at graduation.   Activity Barriers & Risk Stratification:  Activity Barriers & Cardiac Risk Stratification - 04/02/23 1431       Activity Barriers & Cardiac Risk Stratification   Activity Barriers Fibromyalgia;Muscular Weakness;Shortness of Breath;Assistive Device   Cane            6 Minute Walk:  6 Minute Walk     Row Name 04/02/23 1427         6 Minute Walk   Phase Initial     Distance 1180 feet     Walk Time 6 minutes     # of Rest Breaks 0     MPH 2.2     METS 3.23     RPE 17     Perceived Dyspnea  3     VO2 Peak 11.3     Symptoms No     Resting HR 110 bpm     Resting BP 92/64     Resting Oxygen  Saturation  95 %     Exercise Oxygen Saturation  during 6 min walk 89 %  Max Ex. HR 126 bpm     Max Ex. BP 106/64     2 Minute Post BP 90/64       Interval HR   1 Minute HR 116     2 Minute HR 126     3 Minute HR 125     4 Minute HR 121     5 Minute HR 125     6 Minute HR 126     2 Minute Post HR 118     Interval Heart Rate? Yes       Interval Oxygen   Interval Oxygen? Yes     Baseline Oxygen Saturation % 95 %     1 Minute Oxygen Saturation % 95 %     2 Minute Oxygen Saturation % 93 %     3 Minute Oxygen Saturation % 91 %     4 Minute Oxygen Saturation % 89 %     5 Minute Oxygen Saturation % 97 %     6 Minute Oxygen Saturation % 98 %     2 Minute Post Oxygen Saturation % 99 %             Oxygen Initial Assessment:  Oxygen Initial Assessment - 04/01/23 0946       Home Oxygen   Home Oxygen Device None    Sleep Oxygen Prescription None    Home Exercise Oxygen Prescription None    Home Resting Oxygen Prescription None      Initial 6 min Walk   Oxygen Used None      Program Oxygen Prescription   Program Oxygen Prescription None      Intervention   Short Term Goals To learn and exhibit compliance with exercise, home and travel O2 prescription;To learn and understand importance of monitoring SPO2 with pulse oximeter and demonstrate accurate use of the pulse oximeter.;To learn and understand importance of maintaining oxygen saturations>88%;To learn and demonstrate proper pursed lip breathing techniques or other breathing techniques. ;To learn and demonstrate proper use of respiratory medications    Long  Term Goals Exhibits compliance with exercise, home  and travel O2 prescription;Verbalizes importance of monitoring SPO2 with pulse oximeter and return demonstration;Maintenance of O2 saturations>88%;Exhibits proper breathing techniques, such as pursed lip breathing or other method taught during program session;Compliance with respiratory medication;Demonstrates proper use  of MDI's             Oxygen Re-Evaluation:   Oxygen Discharge (Final Oxygen Re-Evaluation):   Initial Exercise Prescription:  Initial Exercise Prescription - 04/02/23 1400       Date of Initial Exercise RX and Referring Provider   Date 04/02/23    Referring Provider Dr. Alphonzo Dublin      Oxygen   Maintain Oxygen Saturation 88% or higher      Treadmill   MPH 2.2    Grade 0    Minutes 15    METs 2.68      Recumbant Bike   Level 2    RPM 50    Watts 25    Minutes 15    METs 3.23      NuStep   Level 2    SPM 80    Minutes 15    METs 3.23      Arm Ergometer   Level 1    Watts 25    Minutes 15    METs 3.23      T5 Nustep   Level 2    SPM 80  Minutes 15    METs 3.23      Biostep-RELP   Level 2    SPM 50    Minutes 15    METs 3.23      Track   Laps 41    Minutes 15    METs 3.23      Prescription Details   Duration Progress to 30 minutes of continuous aerobic without signs/symptoms of physical distress      Intensity   THRR 40-80% of Max Heartrate 131-152    Ratings of Perceived Exertion 11-13    Perceived Dyspnea 0-4      Progression   Progression Continue progressive overload as per policy without signs/symptoms or physical distress.      Resistance Training   Training Prescription Yes    Weight 3 lb    Reps 10-15             Perform Capillary Blood Glucose checks as needed.  Exercise Prescription Changes:   Exercise Prescription Changes     Row Name 04/02/23 1400             Response to Exercise   Blood Pressure (Admit) 92/64       Blood Pressure (Exercise) 106/64       Blood Pressure (Exit) 90/64       Heart Rate (Admit) 110 bpm       Heart Rate (Exercise) 126 bpm       Heart Rate (Exit) 118 bpm       Oxygen Saturation (Admit) 95 %       Oxygen Saturation (Exercise) 89 %       Oxygen Saturation (Exit) 99 %       Rating of Perceived Exertion (Exercise) 17       Perceived Dyspnea (Exercise) 3       Symptoms  none       Comments results                Exercise Comments:   Exercise Goals and Review:   Exercise Goals     Row Name 04/02/23 1435             Exercise Goals   Increase Physical Activity Yes       Intervention Provide advice, education, support and counseling about physical activity/exercise needs.;Develop an individualized exercise prescription for aerobic and resistive training based on initial evaluation findings, risk stratification, comorbidities and participant's personal goals.       Expected Outcomes Long Term: Add in home exercise to make exercise part of routine and to increase amount of physical activity.;Long Term: Exercising regularly at least 3-5 days a week.;Short Term: Attend rehab on a regular basis to increase amount of physical activity.       Increase Strength and Stamina Yes       Intervention Develop an individualized exercise prescription for aerobic and resistive training based on initial evaluation findings, risk stratification, comorbidities and participant's personal goals.;Provide advice, education, support and counseling about physical activity/exercise needs.       Expected Outcomes Long Term: Improve cardiorespiratory fitness, muscular endurance and strength as measured by increased METs and functional capacity ( );Short Term: Perform resistance training exercises routinely during rehab and add in resistance training at home;Short Term: Increase workloads from initial exercise prescription for resistance, speed, and METs.       Able to understand and use rate of perceived exertion (RPE) scale Yes       Intervention Provide education and  explanation on how to use RPE scale       Expected Outcomes Long Term:  Able to use RPE to guide intensity level when exercising independently;Short Term: Able to use RPE daily in rehab to express subjective intensity level       Able to understand and use Dyspnea scale Yes       Intervention Provide  education and explanation on how to use Dyspnea scale       Expected Outcomes Long Term: Able to use Dyspnea scale to guide intensity level when exercising independently;Short Term: Able to use Dyspnea scale daily in rehab to express subjective sense of shortness of breath during exertion       Knowledge and understanding of Target Heart Rate Range (THRR) Yes       Intervention Provide education and explanation of THRR including how the numbers were predicted and where they are located for reference       Expected Outcomes Long Term: Able to use THRR to govern intensity when exercising independently;Short Term: Able to use daily as guideline for intensity in rehab;Short Term: Able to state/look up THRR       Able to check pulse independently Yes       Intervention Review the importance of being able to check your own pulse for safety during independent exercise;Provide education and demonstration on how to check pulse in carotid and radial arteries.       Expected Outcomes Long Term: Able to check pulse independently and accurately;Short Term: Able to explain why pulse checking is important during independent exercise       Understanding of Exercise Prescription Yes       Intervention Provide education, explanation, and written materials on patient's individual exercise prescription       Expected Outcomes Long Term: Able to explain home exercise prescription to exercise independently;Short Term: Able to explain program exercise prescription                Exercise Goals Re-Evaluation :   Discharge Exercise Prescription (Final Exercise Prescription Changes):  Exercise Prescription Changes - 04/02/23 1400       Response to Exercise   Blood Pressure (Admit) 92/64    Blood Pressure (Exercise) 106/64    Blood Pressure (Exit) 90/64    Heart Rate (Admit) 110 bpm    Heart Rate (Exercise) 126 bpm    Heart Rate (Exit) 118 bpm    Oxygen Saturation (Admit) 95 %    Oxygen Saturation (Exercise)  89 %    Oxygen Saturation (Exit) 99 %    Rating of Perceived Exertion (Exercise) 17    Perceived Dyspnea (Exercise) 3    Symptoms none    Comments results             Nutrition:  Target Goals: Understanding of nutrition guidelines, daily intake of sodium 1500mg , cholesterol 200mg , calories 30% from fat and 7% or less from saturated fats, daily to have 5 or more servings of fruits and vegetables.  Education: All About Nutrition: -Group instruction provided by verbal, written material, interactive activities, discussions, models, and posters to present general guidelines for heart healthy nutrition including fat, fiber, MyPlate, the role of sodium in heart healthy nutrition, utilization of the nutrition label, and utilization of this knowledge for meal planning. Follow up email sent as well. Written material given at graduation.   Biometrics:  Pre Biometrics - 04/02/23 1435       Pre Biometrics   Height 5' 5.8" (1.671  m)    Weight 187 lb 1.6 oz (84.9 kg)    Waist Circumference 41 inches    Hip Circumference 44.5 inches    Waist to Hip Ratio 0.92 %    BMI (Calculated) 30.39    Single Leg Stand 7.15 seconds              Nutrition Therapy Plan and Nutrition Goals:   Nutrition Assessments:  MEDIFICTS Score Key: >=70 Need to make dietary changes  40-70 Heart Healthy Diet <= 40 Therapeutic Level Cholesterol Diet  Flowsheet Row Pulmonary Rehab from 04/02/2023 in [facility] Cardiac and Pulmonary Rehab  Picture Your Plate Total Score on Admission 52      Picture Your Plate Scores: <16 Unhealthy dietary pattern with much room for improvement. 41-50 Dietary pattern unlikely to meet recommendations for good health and room for improvement. 51-60 More healthful dietary pattern, with some room for improvement.  >60 Healthy dietary pattern, although there may be some specific behaviors that could be improved.   Nutrition Goals Re-Evaluation:   Nutrition Goals Discharge  (Final Nutrition Goals Re-Evaluation):   Psychosocial: Target Goals: Acknowledge presence or absence of significant depression and/or stress, maximize coping skills, provide positive support system. Participant is able to verbalize types and ability to use techniques and skills needed for reducing stress and depression.   Education: Stress, Anxiety, and Depression - Group verbal and visual presentation to define topics covered.  Reviews how body is impacted by stress, anxiety, and depression.  Also discusses healthy ways to reduce stress and to treat/manage anxiety and depression.  Written material given at graduation.   Education: Sleep Hygiene -Provides group verbal and written instruction about how sleep can affect your health.  Define sleep hygiene, discuss sleep cycles and impact of sleep habits. Review good sleep hygiene tips.    Initial Review & Psychosocial Screening:  Initial Psych Review & Screening - 04/01/23 0948       Initial Review   Current issues with Current Sleep Concerns      Family Dynamics   Good Support System? Yes    Comments She can look to her room mate and best friends for support. Sometimes she does not sleep well do to possible sleep apnea. Jenna Tyler goes to a [place] group therapy for recovery.      Barriers   Psychosocial barriers to participate in program The patient should benefit from training in stress management and relaxation.      Screening Interventions   Interventions To provide support and resources with identified psychosocial needs;Encouraged to exercise;Provide feedback about the scores to participant    Expected Outcomes Short Term goal: Utilizing psychosocial counselor, staff and physician to assist with identification of specific Stressors or current issues interfering with healing process. Setting desired goal for each stressor or current issue identified.;Long Term Goal: Stressors or current issues are controlled or eliminated.;Short Term  goal: Identification and review with participant of any Quality of Life or Depression concerns found by scoring the questionnaire.;Long Term goal: The participant improves quality of Life and PHQ9 Scores as seen by post scores and/or verbalization of changes             Quality of Life Scores:  Scores of 19 and below usually indicate a poorer quality of life in these areas.  A difference of  2-3 points is a clinically meaningful difference.  A difference of 2-3 points in the total score of the Quality of Life Index has been associated with significant improvement in  overall quality of life, self-image, physical symptoms, and general health in studies assessing change in quality of life.  PHQ-9: Review Flowsheet       04/02/2023 07/07/2015 04/04/2015  Depression screen PHQ 2/9  Decreased Interest 0 0 0  Down, Depressed, Hopeless 0 0 0  PHQ - 2 Score 0 0 0  Altered sleeping 2 - -  Tired, decreased energy 1 - -  Change in appetite 0 - -  Feeling bad or failure about yourself  0 - -  Trouble concentrating 1 - -  Moving slowly or fidgety/restless 0 - -  Suicidal thoughts 0 - -  PHQ-9 Score 4 - -  Difficult doing work/chores Somewhat difficult - -    Details           Interpretation of Total Score  Total Score Depression Severity:  1-4 = Minimal depression, 5-9 = Mild depression, 10-14 = Moderate depression, 15-19 = Moderately severe depression, 20-27 = Severe depression   Psychosocial Evaluation and Intervention:  Psychosocial Evaluation - 04/01/23 0951       Psychosocial Evaluation & Interventions   Interventions Relaxation education;Stress management education;Encouraged to exercise with the program and follow exercise prescription    Comments She can look to her room mate and best friends for support. Sometimes she does not sleep well do to possible sleep apnea. Jenna Tyler goes to a [place] group therapy for recovery.    Expected Outcomes Short: Start [facility] to help with  mood. Long: Maintain a healthy mental state.    Continue Psychosocial Services  Follow up required by staff             Psychosocial Re-Evaluation:   Psychosocial Discharge (Final Psychosocial Re-Evaluation):   Education: Education Goals: Education classes will be provided on a weekly basis, covering required topics. Participant will state understanding/return demonstration of topics presented.  Learning Barriers/Preferences:  Learning Barriers/Preferences - 04/01/23 0946       Learning Barriers/Preferences   Learning Barriers None    Learning Preferences None             General Pulmonary Education Topics:  Infection Prevention: - Provides verbal and written material to individual with discussion of infection control including proper hand washing and proper equipment cleaning during exercise session. Flowsheet Row Pulmonary Rehab from 04/01/2023 in [facility] Cardiac and Pulmonary Rehab  Date 04/01/23  Educator [facility]  Instruction Review Code 1- Verbalizes Understanding       Falls Prevention: - Provides verbal and written material to individual with discussion of falls prevention and safety. Flowsheet Row Pulmonary Rehab from 04/01/2023 in [facility] Cardiac and Pulmonary Rehab  Date 04/01/23  Educator [facility]  Instruction Review Code 1- Verbalizes Understanding       Chronic Lung Disease Review: - Group verbal instruction with posters, models, PowerPoint presentations and videos,  to review new updates, new respiratory medications, new advancements in procedures and treatments. Providing information on websites and "800" numbers for continued self-education. Includes information about supplement oxygen, available portable oxygen systems, continuous and intermittent flow rates, oxygen safety, concentrators, and Medicare reimbursement for oxygen. Explanation of Pulmonary Drugs, including class, frequency, complications, importance of spacers, rinsing mouth after steroid MDI's, and  proper cleaning methods for nebulizers. Review of basic lung anatomy and physiology related to function, structure, and complications of lung disease. Review of risk factors. Discussion about methods for diagnosing sleep apnea and types of masks and machines for OSA. Includes a review of the use of types of environmental controls: home humidity,  furnaces, filters, dust mite/pet prevention, HEPA vacuums. Discussion about weather changes, air quality and the benefits of nasal washing. Instruction on Warning signs, infection symptoms, calling MD promptly, preventive modes, and value of vaccinations. Review of effective airway clearance, coughing and/or vibration techniques. Emphasizing that all should Create an Action Plan. Written material given at graduation.   AED/CPR: - Group verbal and written instruction with the use of models to demonstrate the basic use of the AED with the basic ABC's of resuscitation.    Anatomy and Cardiac Procedures: - Group verbal and visual presentation and models provide information about basic cardiac anatomy and function. Reviews the testing methods done to diagnose heart disease and the outcomes of the test results. Describes the treatment choices: Medical Management, Angioplasty, or Coronary Bypass Surgery for treating various heart conditions including Myocardial Infarction, Angina, Valve Disease, and Cardiac Arrhythmias.  Written material given at graduation.   Medication Safety: - Group verbal and visual instruction to review commonly prescribed medications for heart and lung disease. Reviews the medication, class of the drug, and side effects. Includes the steps to properly store meds and maintain the prescription regimen.  Written material given at graduation.   Other: -Provides group and verbal instruction on various topics (see comments)   Knowledge Questionnaire Score:    Core Components/Risk Factors/Patient Goals at Admission:  Personal Goals and Risk  Factors at Admission - 04/01/23 0946       Core Components/Risk Factors/Patient Goals on Admission    Weight Management Yes    Intervention Weight Management: Develop a combined nutrition and exercise program designed to reach desired caloric intake, while maintaining appropriate intake of nutrient and fiber, sodium and fats, and appropriate energy expenditure required for the weight goal.;Weight Management: Provide education and appropriate resources to help participant work on and attain dietary goals.;Weight Management/Obesity: Establish reasonable short term and long term weight goals.    Expected Outcomes Long Term: Adherence to nutrition and physical activity/exercise program aimed toward attainment of established weight goal;Short Term: Continue to assess and modify interventions until short term weight is achieved;Weight Loss: Understanding of general recommendations for a balanced deficit meal plan, which promotes 1-2 lb weight loss per week and includes a negative energy balance of [phone] kcal/d;Understanding recommendations for meals to include 15-35% energy as protein, 25-35% energy from fat, 35-60% energy from carbohydrates, less than 200mg  of dietary cholesterol, 20-35 gm of total fiber daily;Understanding of distribution of calorie intake throughout the day with the consumption of 4-5 meals/snacks    Improve shortness of breath with ADL's Yes    Intervention Provide education, individualized exercise plan and daily activity instruction to help decrease symptoms of SOB with activities of daily living.    Expected Outcomes Short Term: Improve cardiorespiratory fitness to achieve a reduction of symptoms when performing ADLs;Long Term: Be able to perform more ADLs without symptoms or delay the onset of symptoms    Diabetes Yes    Intervention Provide education about signs/symptoms and action to take for hypo/hyperglycemia.;Provide education about proper nutrition, including hydration, and  aerobic/resistive exercise prescription along with prescribed medications to achieve blood glucose in normal ranges: Fasting glucose 65-99 mg/dL    Expected Outcomes Short Term: Participant verbalizes understanding of the signs/symptoms and immediate care of hyper/hypoglycemia, proper foot care and importance of medication, aerobic/resistive exercise and nutrition plan for blood glucose control.;Long Term: Attainment of HbA1C < 7%.    Hypertension Yes    Intervention Provide education on lifestyle modifcations including regular physical activity/exercise, weight  management, moderate sodium restriction and increased consumption of fresh fruit, vegetables, and low fat dairy, alcohol moderation, and smoking cessation.;Monitor prescription use compliance.    Expected Outcomes Short Term: Continued assessment and intervention until BP is < 140/42mm HG in hypertensive participants. < 130/29mm HG in hypertensive participants with diabetes, heart failure or chronic kidney disease.;Long Term: Maintenance of blood pressure at goal levels.    Lipids Yes    Intervention Provide education and support for participant on nutrition & aerobic/resistive exercise along with prescribed medications to achieve LDL 70mg , HDL >40mg .    Expected Outcomes Short Term: Participant states understanding of desired cholesterol values and is compliant with medications prescribed. Participant is following exercise prescription and nutrition guidelines.;Long Term: Cholesterol controlled with medications as prescribed, with individualized exercise RX and with personalized nutrition plan. Value goals: LDL < 70mg , HDL > 40 mg.             Education:Diabetes - Individual verbal and written instruction to review signs/symptoms of diabetes, desired ranges of glucose level fasting, after meals and with exercise. Acknowledge that pre and post exercise glucose checks will be done for 3 sessions at entry of program. Flowsheet Row Pulmonary  Rehab from 04/01/2023 in [facility] Cardiac and Pulmonary Rehab  Date 04/01/23  Educator [facility]  Instruction Review Code 1- Verbalizes Understanding       Know Your Numbers and Heart Failure: - Group verbal and visual instruction to discuss disease risk factors for cardiac and pulmonary disease and treatment options.  Reviews associated critical values for Overweight/Obesity, Hypertension, Cholesterol, and Diabetes.  Discusses basics of heart failure: signs/symptoms and treatments.  Introduces Heart Failure Zone chart for action plan for heart failure.  Written material given at graduation.   Core Components/Risk Factors/Patient Goals Review:    Core Components/Risk Factors/Patient Goals at Discharge (Final Review):    ITP Comments:  ITP Comments     Row Name 04/01/23 0945 04/02/23 1426         ITP Comments Virtual Visit completed. Patient informed on EP and RD appointment and 6 Minute walk test. Patient also informed of patient health questionnaires on My Chart. Patient Verbalizes understanding. Visit diagnosis can be found in [facility] 01/13/[facility]. Completed and gym orientation. Initial ITP created and sent for review to Dr. Jinny Sanders, Medical Director.               Comments: Initial ITP

## 2023-04-02 NOTE — Patient Instructions (Signed)
Patient Instructions  Patient Details  Name: Jenna Tyler MRN: [id number] Date of Birth: 1965/11/12 Referring Provider:  Darnelle Maffucci, MD  Below are your personal goals for exercise, nutrition, and risk factors. Our goal is to help you stay on track towards obtaining and maintaining these goals. We will be discussing your progress on these goals with you throughout the program.  Initial Exercise Prescription:  Initial Exercise Prescription - 04/02/23 1400       Date of Initial Exercise RX and Referring Provider   Date 04/02/23    Referring Provider Dr. Alphonzo Dublin      Oxygen   Maintain Oxygen Saturation 88% or higher      Treadmill   MPH 2.2    Grade 0    Minutes 15    METs 2.68      Recumbant Bike   Level 2    RPM 50    Watts 25    Minutes 15    METs 3.23      NuStep   Level 2    SPM 80    Minutes 15    METs 3.23      Arm Ergometer   Level 1    Watts 25    Minutes 15    METs 3.23      T5 Nustep   Level 2    SPM 80    Minutes 15    METs 3.23      Biostep-RELP   Level 2    SPM 50    Minutes 15    METs 3.23      Track   Laps 41    Minutes 15    METs 3.23      Prescription Details   Duration Progress to 30 minutes of continuous aerobic without signs/symptoms of physical distress      Intensity   THRR 40-80% of Max Heartrate 131-152    Ratings of Perceived Exertion 11-13    Perceived Dyspnea 0-4      Progression   Progression Continue progressive overload as per policy without signs/symptoms or physical distress.      Resistance Training   Training Prescription Yes    Weight 3 lb    Reps 10-15             Exercise Goals: Frequency: Be able to perform aerobic exercise two to three times per week in program working toward 2-5 days per week of home exercise.  Intensity: Work with a perceived exertion of 11 (fairly light) - 15 (hard) while following your exercise prescription.  We will make changes to your prescription  with you as you progress through the program.   Duration: Be able to do 30 to 45 minutes of continuous aerobic exercise in addition to a 5 minute warm-up and a 5 minute cool-down routine.   Nutrition Goals: Your personal nutrition goals will be established when you do your nutrition analysis with the dietician.  The following are general nutrition guidelines to follow: Cholesterol < 200mg /day Sodium < 1500mg /day Fiber: Women over 50 yrs - 21 grams per day  Personal Goals:  Personal Goals and Risk Factors at Admission - 04/01/23 0946       Core Components/Risk Factors/Patient Goals on Admission    Weight Management Yes    Intervention Weight Management: Develop a combined nutrition and exercise program designed to reach desired caloric intake, while maintaining appropriate intake of nutrient and fiber, sodium and fats, and appropriate energy expenditure required  for the weight goal.;Weight Management: Provide education and appropriate resources to help participant work on and attain dietary goals.;Weight Management/Obesity: Establish reasonable short term and long term weight goals.    Expected Outcomes Long Term: Adherence to nutrition and physical activity/exercise program aimed toward attainment of established weight goal;Short Term: Continue to assess and modify interventions until short term weight is achieved;Weight Loss: Understanding of general recommendations for a balanced deficit meal plan, which promotes 1-2 lb weight loss per week and includes a negative energy balance of [phone] kcal/d;Understanding recommendations for meals to include 15-35% energy as protein, 25-35% energy from fat, 35-60% energy from carbohydrates, less than 200mg  of dietary cholesterol, 20-35 gm of total fiber daily;Understanding of distribution of calorie intake throughout the day with the consumption of 4-5 meals/snacks    Improve shortness of breath with ADL's Yes    Intervention Provide education,  individualized exercise plan and daily activity instruction to help decrease symptoms of SOB with activities of daily living.    Expected Outcomes Short Term: Improve cardiorespiratory fitness to achieve a reduction of symptoms when performing ADLs;Long Term: Be able to perform more ADLs without symptoms or delay the onset of symptoms    Diabetes Yes    Intervention Provide education about signs/symptoms and action to take for hypo/hyperglycemia.;Provide education about proper nutrition, including hydration, and aerobic/resistive exercise prescription along with prescribed medications to achieve blood glucose in normal ranges: Fasting glucose 65-99 mg/dL    Expected Outcomes Short Term: Participant verbalizes understanding of the signs/symptoms and immediate care of hyper/hypoglycemia, proper foot care and importance of medication, aerobic/resistive exercise and nutrition plan for blood glucose control.;Long Term: Attainment of HbA1C < 7%.    Hypertension Yes    Intervention Provide education on lifestyle modifcations including regular physical activity/exercise, weight management, moderate sodium restriction and increased consumption of fresh fruit, vegetables, and low fat dairy, alcohol moderation, and smoking cessation.;Monitor prescription use compliance.    Expected Outcomes Short Term: Continued assessment and intervention until BP is < 140/53mm HG in hypertensive participants. < 130/88mm HG in hypertensive participants with diabetes, heart failure or chronic kidney disease.;Long Term: Maintenance of blood pressure at goal levels.    Lipids Yes    Intervention Provide education and support for participant on nutrition & aerobic/resistive exercise along with prescribed medications to achieve LDL 70mg , HDL >40mg .    Expected Outcomes Short Term: Participant states understanding of desired cholesterol values and is compliant with medications prescribed. Participant is following exercise prescription  and nutrition guidelines.;Long Term: Cholesterol controlled with medications as prescribed, with individualized exercise RX and with personalized nutrition plan. Value goals: LDL < 70mg , HDL > 40 mg.             Tobacco Use Initial Evaluation: Social History   Tobacco Use  Smoking Status Former   Current packs/day: 0.00   Average packs/day: 1 pack/day for 15.0 years (15.0 ttl pk-yrs)   Types: Cigarettes   Start date: 12/29/1982   Quit date: 12/28/1997   Years since quitting: 25.2  Smokeless Tobacco Never    Exercise Goals and Review:  Exercise Goals     Row Name 04/02/23 1435             Exercise Goals   Increase Physical Activity Yes       Intervention Provide advice, education, support and counseling about physical activity/exercise needs.;Develop an individualized exercise prescription for aerobic and resistive training based on initial evaluation findings, risk stratification, comorbidities and participant's personal goals.  Expected Outcomes Long Term: Add in home exercise to make exercise part of routine and to increase amount of physical activity.;Long Term: Exercising regularly at least 3-5 days a week.;Short Term: Attend rehab on a regular basis to increase amount of physical activity.       Increase Strength and Stamina Yes       Intervention Develop an individualized exercise prescription for aerobic and resistive training based on initial evaluation findings, risk stratification, comorbidities and participant's personal goals.;Provide advice, education, support and counseling about physical activity/exercise needs.       Expected Outcomes Long Term: Improve cardiorespiratory fitness, muscular endurance and strength as measured by increased METs and functional capacity ( );Short Term: Perform resistance training exercises routinely during rehab and add in resistance training at home;Short Term: Increase workloads from initial exercise prescription for resistance,  speed, and METs.       Able to understand and use rate of perceived exertion (RPE) scale Yes       Intervention Provide education and explanation on how to use RPE scale       Expected Outcomes Long Term:  Able to use RPE to guide intensity level when exercising independently;Short Term: Able to use RPE daily in rehab to express subjective intensity level       Able to understand and use Dyspnea scale Yes       Intervention Provide education and explanation on how to use Dyspnea scale       Expected Outcomes Long Term: Able to use Dyspnea scale to guide intensity level when exercising independently;Short Term: Able to use Dyspnea scale daily in rehab to express subjective sense of shortness of breath during exertion       Knowledge and understanding of Target Heart Rate Range (THRR) Yes       Intervention Provide education and explanation of THRR including how the numbers were predicted and where they are located for reference       Expected Outcomes Long Term: Able to use THRR to govern intensity when exercising independently;Short Term: Able to use daily as guideline for intensity in rehab;Short Term: Able to state/look up THRR       Able to check pulse independently Yes       Intervention Review the importance of being able to check your own pulse for safety during independent exercise;Provide education and demonstration on how to check pulse in carotid and radial arteries.       Expected Outcomes Long Term: Able to check pulse independently and accurately;Short Term: Able to explain why pulse checking is important during independent exercise       Understanding of Exercise Prescription Yes       Intervention Provide education, explanation, and written materials on patient's individual exercise prescription       Expected Outcomes Long Term: Able to explain home exercise prescription to exercise independently;Short Term: Able to explain program exercise prescription                Copy of  goals given to participant.

## 2023-04-08 ENCOUNTER — Encounter: Payer: Medicaid Other | Admitting: *Deleted

## 2023-04-08 DIAGNOSIS — J449 Chronic obstructive pulmonary disease, unspecified: Secondary | ICD-10-CM | POA: Diagnosis not present

## 2023-04-08 NOTE — Progress Notes (Signed)
Daily Session Note  Patient Details  Name: Jenna Tyler MRN: [id number] Date of Birth: October 27, 1965 Referring Provider:   Flowsheet Row Pulmonary Rehab from 04/02/2023 in [facility] Cardiac and Pulmonary Rehab  Referring Provider Dr. Alphonzo Dublin       Encounter Date: 04/08/2023  Check In:  Session Check In - 04/08/23 0935       Check-In   Supervising physician immediately available to respond to emergencies See telemetry face sheet for immediately available ER MD    Location [facility]-Cardiac & Pulmonary Rehab    Staff Present Rory Percy, MS, Exercise Physiologist;Maxon Suzzette Righter, , Exercise Physiologist;Kelly Madilyn Fireman, BS, ACSM CEP, Exercise Physiologist;Maheen Cwikla Katrinka Blazing, RN, ADN    Virtual Visit No    Medication changes reported     No    Fall or balance concerns reported    No    Warm-up and Cool-down Performed on first and last piece of equipment    Resistance Training Performed Yes    VAD Patient? No    PAD/SET Patient? No      Pain Assessment   Currently in Pain? No/denies                Social History   Tobacco Use  Smoking Status Former   Current packs/day: 0.00   Average packs/day: 1 pack/day for 15.0 years (15.0 ttl pk-yrs)   Types: Cigarettes   Start date: 12/29/1982   Quit date: 12/28/1997   Years since quitting: 25.2  Smokeless Tobacco Never    Goals Met:  Independence with exercise equipment Exercise tolerated well No report of concerns or symptoms today Strength training completed today  Goals Unmet:  Not Applicable  Comments: First full day of exercise!  Patient was oriented to gym and equipment including functions, settings, policies, and procedures.  Patient's individual exercise prescription and treatment plan were reviewed.  All starting workloads were established based on the results of the 6 minute walk test done at initial orientation visit.  The plan for exercise progression was also introduced and progression will be customized based on  patient's performance and goals.    Dr. Bethann Punches is Medical Director for [facility] Cardiac Rehabilitation.  Dr. Vida Rigger is Medical Director for [facility] Pulmonary Rehabilitation.

## 2023-04-09 DIAGNOSIS — N3946 Mixed incontinence: Secondary | ICD-10-CM | POA: Insufficient documentation

## 2023-04-09 DIAGNOSIS — N816 Rectocele: Secondary | ICD-10-CM | POA: Insufficient documentation

## 2023-04-10 ENCOUNTER — Encounter: Payer: Self-pay | Admitting: *Deleted

## 2023-04-10 ENCOUNTER — Encounter: Payer: Medicaid Other | Admitting: *Deleted

## 2023-04-10 DIAGNOSIS — J449 Chronic obstructive pulmonary disease, unspecified: Secondary | ICD-10-CM

## 2023-04-10 NOTE — Progress Notes (Signed)
Pulmonary Individual Treatment Plan  Patient Details  Name: Jenna Tyler MRN: [id number] Date of Birth: July 11, 1965 Referring Provider:   Flowsheet Row Pulmonary Rehab from 04/02/2023 in [facility] Cardiac and Pulmonary Rehab  Referring Provider Dr. Alphonzo Dublin       Initial Encounter Date:  Flowsheet Row Pulmonary Rehab from 04/02/2023 in [facility] Cardiac and Pulmonary Rehab  Date 04/02/23       Visit Diagnosis: Chronic obstructive pulmonary disease, unspecified COPD type (HCC)  Patient's Home Medications on Admission:  Current Outpatient Medications:    albuterol (PROVENTIL) (2.5 MG/3ML) 0.083% nebulizer solution, Inhale into the lungs., Disp: , Rfl:    albuterol (VENTOLIN HFA) 108 (90 Base) MCG/ACT inhaler, Inhale 1-2 puffs into the lungs every 4 (four) hours as needed. (Patient not taking: Reported on 04/01/2023), Disp: , Rfl:    Blood Glucose Monitoring Suppl (GLUCOCOM BLOOD GLUCOSE MONITOR) DEVI, 1 each as directed, Disp: , Rfl:    budesonide (PULMICORT) 0.25 MG/2ML nebulizer solution, Inhale into the lungs., Disp: , Rfl:    butalbital-acetaminophen-caffeine (FIORICET) 50-325-40 MG tablet, Take 1 tablet by mouth every 4 (four) hours as needed for headache., Disp: 14 tablet, Rfl: 0   chlorpheniramine-HYDROcodone (TUSSIONEX) 10-8 MG/5ML, Take 5 mLs by mouth every 12 (twelve) hours., Disp: 70 mL, Rfl: 0   Cholecalciferol 50 MCG (2000 UT) TABS, Take by mouth. (Patient not taking: Reported on 04/01/2023), Disp: , Rfl:    Cysteamine Bitartrate (PROCYSBI) 300 MG PACK, Use 1 each once daily Use as instructed., Disp: , Rfl:    dicyclomine (BENTYL) 20 MG tablet, Take 1 tablet (20 mg total) by mouth every 8 (eight) hours as needed., Disp: 15 tablet, Rfl: 0   doxycycline (VIBRAMYCIN) 100 MG capsule, SMARTSIG:1.0 Capsule(s) By Mouth Twice Daily, Disp: , Rfl:    gabapentin (NEURONTIN) 800 MG tablet, Take 800 mg by mouth 2 (two) times daily., Disp: , Rfl:    glucose blood (ACCU-CHEK GUIDE  TEST) test strip, 1 each (1 strip total) once daily Use as instructed., Disp: , Rfl:    HYDROcodone-acetaminophen (NORCO/VICODIN) 5-325 MG tablet, Take 1 tablet by mouth every 4 (four) hours as needed., Disp: 15 tablet, Rfl: 0   hydrOXYzine (ATARAX) 25 MG tablet, Take by mouth. (Patient not taking: Reported on 04/01/2023), Disp: , Rfl:    ibuprofen (ADVIL) 200 MG tablet, Take by mouth., Disp: , Rfl:    levothyroxine (SYNTHROID) 100 MCG tablet, Take 100 mcg by mouth daily., Disp: , Rfl:    meclizine (ANTIVERT) 25 MG tablet, Take 25mg  1-3 times daily as needed (Patient not taking: Reported on 04/01/2023), Disp: , Rfl:    metFORMIN (GLUCOPHAGE) 500 MG tablet, Take 500 mg by mouth 2 (two) times daily. (Patient not taking: Reported on 04/01/2023), Disp: , Rfl:    methocarbamol (ROBAXIN) 750 MG tablet, Take 750 mg by mouth 2 (two) times daily. (Patient not taking: Reported on 04/01/2023), Disp: , Rfl:    metoprolol tartrate (LOPRESSOR) 25 MG tablet, Take 25 mg by mouth every morning., Disp: , Rfl:    Multiple Vitamin (MULTIVITAMIN WITH MINERALS) TABS tablet, Take 1 tablet by mouth daily. (Patient not taking: Reported on 04/01/2023), Disp: , Rfl:    nortriptyline (PAMELOR) 10 MG capsule, , Disp: , Rfl:    omeprazole (PRILOSEC) 40 MG capsule, Take by mouth., Disp: , Rfl:    ondansetron (ZOFRAN) 4 MG tablet, Take 8 mg by mouth 2 (two) times daily., Disp: , Rfl:    ondansetron (ZOFRAN-ODT) 4 MG disintegrating tablet, Take 1 tablet (  4 mg total) by mouth every 6 (six) hours as needed for nausea or vomiting. (Patient not taking: Reported on 04/01/2023), Disp: 20 tablet, Rfl: 0   polyethylene glycol (MIRALAX) 17 g packet, Take 17 g by mouth daily. (Patient not taking: Reported on 04/01/2023), Disp: 30 each, Rfl: 0   rOPINIRole (REQUIP) 0.5 MG tablet, Take 1 mg by mouth 2 (two) times daily., Disp: , Rfl:    SYMBICORT 160-4.5 MCG/ACT inhaler, Inhale 2 puffs into the lungs 2 (two) times daily., Disp: , Rfl:   Past Medical  History: Past Medical History:  Diagnosis Date   AKI (acute kidney injury) (HCC) 07/12/2017   Asthma    COPD (chronic obstructive pulmonary disease) (HCC)    COPD exacerbation (HCC) 12/29/2016   COPD with acute exacerbation (HCC) 10/02/2022   DM (diabetes mellitus), type 2 (HCC)    Dyspnea    Fibromyalgia    Guillain Barr syndrome (HCC)    Headache    Hyperlipidemia    Hypertension    Hypothyroidism    Neurofibromatosis, peripheral, NF1 (HCC) 2017   Pancreatitis    PONV (postoperative nausea and vomiting)    Pulmonary nodule    RLS (restless legs syndrome)    Sleep apnea    never went to pick up her cpap   Tachycardia    Tracheomalacia     Tobacco Use: Social History   Tobacco Use  Smoking Status Former   Current packs/day: 0.00   Average packs/day: 1 pack/day for 15.0 years (15.0 ttl pk-yrs)   Types: Cigarettes   Start date: 12/29/1982   Quit date: 12/28/1997   Years since quitting: 25.2  Smokeless Tobacco Never    Labs: Review Flowsheet  More data may exist      Latest Ref Rng & Units 06/27/2015 12/29/2016 07/09/2019 07/10/2019 10/04/2021  Labs for ITP Cardiac and Pulmonary Rehab  Cholestrol 0 - 200 mg/dL - 960  - - -  LDL (calc) 0 - 99 mg/dL - 454  - - -  HDL-C >09 mg/dL - 47  - - -  Trlycerides <150 mg/dL - 811  - - -  Hemoglobin A1c 4.8 - 5.6 % 5.9  5.7  - 6.3  6.2   PH, Arterial 7.350 - 7.450 - - 7.39  - -  PCO2 arterial 32.0 - 48.0 mmHg - - 30  - -  Bicarbonate 20.0 - 28.0 mmol/L - - 18.2  - -  Acid-base deficit 0.0 - 2.0 mmol/L - - 5.6  - -  O2 Saturation % - - 97.7  - -     Pulmonary Assessment Scores:   UCSD: Self-administered rating of dyspnea associated with activities of daily living (ADLs) 6-point scale (0 = "not at all" to 5 = "maximal or unable to do because of breathlessness")  Scoring Scores range from 0 to 120.  Minimally important difference is 5 units  CAT: CAT can identify the health impairment of COPD patients and is better correlated  with disease progression.  CAT has a scoring range of zero to 40. The CAT score is classified into four groups of low (less than 10), medium (10 - 20), high (21-30) and very high (31-40) based on the impact level of disease on health status. A CAT score over 10 suggests significant symptoms.  A worsening CAT score could be explained by an exacerbation, poor medication adherence, poor inhaler technique, or progression of COPD or comorbid conditions.  CAT MCID is 2 points  mMRC: mMRC (Modified Medical  Research Council) Dyspnea Scale is used to assess the degree of baseline functional disability in patients of respiratory disease due to dyspnea. No minimal important difference is established. A decrease in score of 1 point or greater is considered a positive change.   Pulmonary Function Assessment:  Pulmonary Function Assessment - 04/01/23 0946       Breath   Shortness of Breath Yes;Limiting activity             Exercise Target Goals: Exercise Program Goal: Individual exercise prescription set using results from initial 6 min walk test and THRR while considering  patient's activity barriers and safety.   Exercise Prescription Goal: Initial exercise prescription builds to 30-45 minutes a day of aerobic activity, 2-3 days per week.  Home exercise guidelines will be given to patient during program as part of exercise prescription that the participant will acknowledge.  Education: Aerobic Exercise: - Group verbal and visual presentation on the components of exercise prescription. Introduces F.I.T.T principle from ACSM for exercise prescriptions.  Reviews F.I.T.T. principles of aerobic exercise including progression. Written material given at graduation.   Education: Resistance Exercise: - Group verbal and visual presentation on the components of exercise prescription. Introduces F.I.T.T principle from ACSM for exercise prescriptions  Reviews F.I.T.T. principles of resistance exercise  including progression. Written material given at graduation.    Education: Exercise & Equipment Safety: - Individual verbal instruction and demonstration of equipment use and safety with use of the equipment. Flowsheet Row Pulmonary Rehab from 04/01/2023 in [facility] Cardiac and Pulmonary Rehab  Date 04/01/23  Educator jh  Instruction Review Code 1- Verbalizes Understanding       Education: Exercise Physiology & General Exercise Guidelines: - Group verbal and written instruction with models to review the exercise physiology of the cardiovascular system and associated critical values. Provides general exercise guidelines with specific guidelines to those with heart or lung disease.    Education: Flexibility, Balance, Mind/Body Relaxation: - Group verbal and visual presentation with interactive activity on the components of exercise prescription. Introduces F.I.T.T principle from ACSM for exercise prescriptions. Reviews F.I.T.T. principles of flexibility and balance exercise training including progression. Also discusses the mind body connection.  Reviews various relaxation techniques to help reduce and manage stress (i.e. Deep breathing, progressive muscle relaxation, and visualization). Balance handout provided to take home. Written material given at graduation.   Activity Barriers & Risk Stratification:  Activity Barriers & Cardiac Risk Stratification - 04/02/23 1431       Activity Barriers & Cardiac Risk Stratification   Activity Barriers Fibromyalgia;Muscular Weakness;Shortness of Breath;Assistive Device   Cane            6 Minute Walk:  6 Minute Walk     Row Name 04/02/23 1427         6 Minute Walk   Phase Initial     Distance 1180 feet     Walk Time 6 minutes     # of Rest Breaks 0     MPH 2.2     METS 3.23     RPE 17     Perceived Dyspnea  3     VO2 Peak 11.3     Symptoms No     Resting HR 110 bpm     Resting BP 92/64     Resting Oxygen Saturation  95 %      Exercise Oxygen Saturation  during 6 min walk 89 %     Max Ex. HR 126 bpm  Max Ex. BP 106/64     2 Minute Post BP 90/64       Interval HR   1 Minute HR 116     2 Minute HR 126     3 Minute HR 125     4 Minute HR 121     5 Minute HR 125     6 Minute HR 126     2 Minute Post HR 118     Interval Heart Rate? Yes       Interval Oxygen   Interval Oxygen? Yes     Baseline Oxygen Saturation % 95 %     1 Minute Oxygen Saturation % 95 %     2 Minute Oxygen Saturation % 93 %     3 Minute Oxygen Saturation % 91 %     4 Minute Oxygen Saturation % 89 %     5 Minute Oxygen Saturation % 97 %     6 Minute Oxygen Saturation % 98 %     2 Minute Post Oxygen Saturation % 99 %             Oxygen Initial Assessment:  Oxygen Initial Assessment - 04/01/23 0946       Home Oxygen   Home Oxygen Device None    Sleep Oxygen Prescription None    Home Exercise Oxygen Prescription None    Home Resting Oxygen Prescription None      Initial 6 min Walk   Oxygen Used None      Program Oxygen Prescription   Program Oxygen Prescription None      Intervention   Short Term Goals To learn and exhibit compliance with exercise, home and travel O2 prescription;To learn and understand importance of monitoring SPO2 with pulse oximeter and demonstrate accurate use of the pulse oximeter.;To learn and understand importance of maintaining oxygen saturations>88%;To learn and demonstrate proper pursed lip breathing techniques or other breathing techniques. ;To learn and demonstrate proper use of respiratory medications    Long  Term Goals Exhibits compliance with exercise, home  and travel O2 prescription;Verbalizes importance of monitoring SPO2 with pulse oximeter and return demonstration;Maintenance of O2 saturations>88%;Exhibits proper breathing techniques, such as pursed lip breathing or other method taught during program session;Compliance with respiratory medication;Demonstrates proper use of MDI's              Oxygen Re-Evaluation:  Oxygen Re-Evaluation     Row Name 04/08/23 0937             Goals/Expected Outcomes   Comments Reviewed PLB technique with pt.  Talked about how it works and it's importance in maintaining their exercise saturations.       Goals/Expected Outcomes Short: Become more profiecient at using PLB. Long: Become independent at using PLB.                Oxygen Discharge (Final Oxygen Re-Evaluation):  Oxygen Re-Evaluation - 04/08/23 0937       Goals/Expected Outcomes   Comments Reviewed PLB technique with pt.  Talked about how it works and it's importance in maintaining their exercise saturations.    Goals/Expected Outcomes Short: Become more profiecient at using PLB. Long: Become independent at using PLB.             Initial Exercise Prescription:  Initial Exercise Prescription - 04/02/23 1400       Date of Initial Exercise RX and Referring Provider   Date 04/02/23    Referring Provider Dr. Alphonzo Dublin  Oxygen   Maintain Oxygen Saturation 88% or higher      Treadmill   MPH 2.2    Grade 0    Minutes 15    METs 2.68      Recumbant Bike   Level 2    RPM 50    Watts 25    Minutes 15    METs 3.23      NuStep   Level 2    SPM 80    Minutes 15    METs 3.23      Arm Ergometer   Level 1    Watts 25    Minutes 15    METs 3.23      T5 Nustep   Level 2    SPM 80    Minutes 15    METs 3.23      Biostep-RELP   Level 2    SPM 50    Minutes 15    METs 3.23      Track   Laps 41    Minutes 15    METs 3.23      Prescription Details   Duration Progress to 30 minutes of continuous aerobic without signs/symptoms of physical distress      Intensity   THRR 40-80% of Max Heartrate 131-152    Ratings of Perceived Exertion 11-13    Perceived Dyspnea 0-4      Progression   Progression Continue progressive overload as per policy without signs/symptoms or physical distress.      Resistance Training   Training  Prescription Yes    Weight 3 lb    Reps 10-15             Perform Capillary Blood Glucose checks as needed.  Exercise Prescription Changes:   Exercise Prescription Changes     Row Name 04/02/23 1400             Response to Exercise   Blood Pressure (Admit) 92/64       Blood Pressure (Exercise) 106/64       Blood Pressure (Exit) 90/64       Heart Rate (Admit) 110 bpm       Heart Rate (Exercise) 126 bpm       Heart Rate (Exit) 118 bpm       Oxygen Saturation (Admit) 95 %       Oxygen Saturation (Exercise) 89 %       Oxygen Saturation (Exit) 99 %       Rating of Perceived Exertion (Exercise) 17       Perceived Dyspnea (Exercise) 3       Symptoms none       Comments results                Exercise Comments:   Exercise Comments     Row Name 04/08/23 0936           Exercise Comments First full day of exercise!  Patient was oriented to gym and equipment including functions, settings, policies, and procedures.  Patient's individual exercise prescription and treatment plan were reviewed.  All starting workloads were established based on the results of the 6 minute walk test done at initial orientation visit.  The plan for exercise progression was also introduced and progression will be customized based on patient's performance and goals.                Exercise Goals and Review:   Exercise Goals  Row Name 04/02/23 1435             Exercise Goals   Increase Physical Activity Yes       Intervention Provide advice, education, support and counseling about physical activity/exercise needs.;Develop an individualized exercise prescription for aerobic and resistive training based on initial evaluation findings, risk stratification, comorbidities and participant's personal goals.       Expected Outcomes Long Term: Add in home exercise to make exercise part of routine and to increase amount of physical activity.;Long Term: Exercising regularly at least  3-5 days a week.;Short Term: Attend rehab on a regular basis to increase amount of physical activity.       Increase Strength and Stamina Yes       Intervention Develop an individualized exercise prescription for aerobic and resistive training based on initial evaluation findings, risk stratification, comorbidities and participant's personal goals.;Provide advice, education, support and counseling about physical activity/exercise needs.       Expected Outcomes Long Term: Improve cardiorespiratory fitness, muscular endurance and strength as measured by increased METs and functional capacity ( );Short Term: Perform resistance training exercises routinely during rehab and add in resistance training at home;Short Term: Increase workloads from initial exercise prescription for resistance, speed, and METs.       Able to understand and use rate of perceived exertion (RPE) scale Yes       Intervention Provide education and explanation on how to use RPE scale       Expected Outcomes Long Term:  Able to use RPE to guide intensity level when exercising independently;Short Term: Able to use RPE daily in rehab to express subjective intensity level       Able to understand and use Dyspnea scale Yes       Intervention Provide education and explanation on how to use Dyspnea scale       Expected Outcomes Long Term: Able to use Dyspnea scale to guide intensity level when exercising independently;Short Term: Able to use Dyspnea scale daily in rehab to express subjective sense of shortness of breath during exertion       Knowledge and understanding of Target Heart Rate Range (THRR) Yes       Intervention Provide education and explanation of THRR including how the numbers were predicted and where they are located for reference       Expected Outcomes Long Term: Able to use THRR to govern intensity when exercising independently;Short Term: Able to use daily as guideline for intensity in rehab;Short Term: Able to state/look  up THRR       Able to check pulse independently Yes       Intervention Review the importance of being able to check your own pulse for safety during independent exercise;Provide education and demonstration on how to check pulse in carotid and radial arteries.       Expected Outcomes Long Term: Able to check pulse independently and accurately;Short Term: Able to explain why pulse checking is important during independent exercise       Understanding of Exercise Prescription Yes       Intervention Provide education, explanation, and written materials on patient's individual exercise prescription       Expected Outcomes Long Term: Able to explain home exercise prescription to exercise independently;Short Term: Able to explain program exercise prescription                Exercise Goals Re-Evaluation :  Exercise Goals Re-Evaluation     Row Name 04/08/23 [phone]  Exercise Goal Re-Evaluation   Exercise Goals Review Able to understand and use rate of perceived exertion (RPE) scale;Able to understand and use Dyspnea scale;Knowledge and understanding of Target Heart Rate Range (THRR);Understanding of Exercise Prescription       Comments Reviewed RPE and dyspnea scale, THR and program prescription with pt today.  Pt voiced understanding and was given a copy of goals to take home.       Expected Outcomes Short: Use RPE daily to regulate intensity. Long: Follow program prescription in THR.                Discharge Exercise Prescription (Final Exercise Prescription Changes):  Exercise Prescription Changes - 04/02/23 1400       Response to Exercise   Blood Pressure (Admit) 92/64    Blood Pressure (Exercise) 106/64    Blood Pressure (Exit) 90/64    Heart Rate (Admit) 110 bpm    Heart Rate (Exercise) 126 bpm    Heart Rate (Exit) 118 bpm    Oxygen Saturation (Admit) 95 %    Oxygen Saturation (Exercise) 89 %    Oxygen Saturation (Exit) 99 %    Rating of Perceived Exertion  (Exercise) 17    Perceived Dyspnea (Exercise) 3    Symptoms none    Comments results             Nutrition:  Target Goals: Understanding of nutrition guidelines, daily intake of sodium 1500mg , cholesterol 200mg , calories 30% from fat and 7% or less from saturated fats, daily to have 5 or more servings of fruits and vegetables.  Education: All About Nutrition: -Group instruction provided by verbal, written material, interactive activities, discussions, models, and posters to present general guidelines for heart healthy nutrition including fat, fiber, MyPlate, the role of sodium in heart healthy nutrition, utilization of the nutrition label, and utilization of this knowledge for meal planning. Follow up email sent as well. Written material given at graduation.   Biometrics:  Pre Biometrics - 04/02/23 1435       Pre Biometrics   Height 5' 5.8" (1.671 m)    Weight 187 lb 1.6 oz (84.9 kg)    Waist Circumference 41 inches    Hip Circumference 44.5 inches    Waist to Hip Ratio 0.92 %    BMI (Calculated) 30.39    Single Leg Stand 7.15 seconds              Nutrition Therapy Plan and Nutrition Goals:   Nutrition Assessments:  MEDIFICTS Score Key: >=70 Need to make dietary changes  40-70 Heart Healthy Diet <= 40 Therapeutic Level Cholesterol Diet  Flowsheet Row Pulmonary Rehab from 04/02/2023 in [facility] Cardiac and Pulmonary Rehab  Picture Your Plate Total Score on Admission 52      Picture Your Plate Scores: <16 Unhealthy dietary pattern with much room for improvement. 41-50 Dietary pattern unlikely to meet recommendations for good health and room for improvement. 51-60 More healthful dietary pattern, with some room for improvement.  >60 Healthy dietary pattern, although there may be some specific behaviors that could be improved.   Nutrition Goals Re-Evaluation:   Nutrition Goals Discharge (Final Nutrition Goals Re-Evaluation):   Psychosocial: Target  Goals: Acknowledge presence or absence of significant depression and/or stress, maximize coping skills, provide positive support system. Participant is able to verbalize types and ability to use techniques and skills needed for reducing stress and depression.   Education: Stress, Anxiety, and Depression - Group verbal and visual presentation to define  topics covered.  Reviews how body is impacted by stress, anxiety, and depression.  Also discusses healthy ways to reduce stress and to treat/manage anxiety and depression.  Written material given at graduation.   Education: Sleep Hygiene -Provides group verbal and written instruction about how sleep can affect your health.  Define sleep hygiene, discuss sleep cycles and impact of sleep habits. Review good sleep hygiene tips.    Initial Review & Psychosocial Screening:  Initial Psych Review & Screening - 04/01/23 0948       Initial Review   Current issues with Current Sleep Concerns      Family Dynamics   Good Support System? Yes    Comments She can look to her room mate and best friends for support. Sometimes she does not sleep well do to possible sleep apnea. Namiya goes to a [place] group therapy for recovery.      Barriers   Psychosocial barriers to participate in program The patient should benefit from training in stress management and relaxation.      Screening Interventions   Interventions To provide support and resources with identified psychosocial needs;Encouraged to exercise;Provide feedback about the scores to participant    Expected Outcomes Short Term goal: Utilizing psychosocial counselor, staff and physician to assist with identification of specific Stressors or current issues interfering with healing process. Setting desired goal for each stressor or current issue identified.;Long Term Goal: Stressors or current issues are controlled or eliminated.;Short Term goal: Identification and review with participant of any Quality of  Life or Depression concerns found by scoring the questionnaire.;Long Term goal: The participant improves quality of Life and PHQ9 Scores as seen by post scores and/or verbalization of changes             Quality of Life Scores:  Scores of 19 and below usually indicate a poorer quality of life in these areas.  A difference of  2-3 points is a clinically meaningful difference.  A difference of 2-3 points in the total score of the Quality of Life Index has been associated with significant improvement in overall quality of life, self-image, physical symptoms, and general health in studies assessing change in quality of life.  PHQ-9: Review Flowsheet       04/02/2023 07/07/2015 04/04/2015  Depression screen PHQ 2/9  Decreased Interest 0 0 0  Down, Depressed, Hopeless 0 0 0  PHQ - 2 Score 0 0 0  Altered sleeping 2 - -  Tired, decreased energy 1 - -  Change in appetite 0 - -  Feeling bad or failure about yourself  0 - -  Trouble concentrating 1 - -  Moving slowly or fidgety/restless 0 - -  Suicidal thoughts 0 - -  PHQ-9 Score 4 - -  Difficult doing work/chores Somewhat difficult - -   Interpretation of Total Score  Total Score Depression Severity:  1-4 = Minimal depression, 5-9 = Mild depression, 10-14 = Moderate depression, 15-19 = Moderately severe depression, 20-27 = Severe depression   Psychosocial Evaluation and Intervention:  Psychosocial Evaluation - 04/01/23 0951       Psychosocial Evaluation & Interventions   Interventions Relaxation education;Stress management education;Encouraged to exercise with the program and follow exercise prescription    Comments She can look to her room mate and best friends for support. Sometimes she does not sleep well do to possible sleep apnea. Milanya goes to a [place] group therapy for recovery.    Expected Outcomes Short: Start [facility] to help with mood. Long:  Maintain a healthy mental state.    Continue Psychosocial Services  Follow up  required by staff             Psychosocial Re-Evaluation:   Psychosocial Discharge (Final Psychosocial Re-Evaluation):   Education: Education Goals: Education classes will be provided on a weekly basis, covering required topics. Participant will state understanding/return demonstration of topics presented.  Learning Barriers/Preferences:  Learning Barriers/Preferences - 04/01/23 0946       Learning Barriers/Preferences   Learning Barriers None    Learning Preferences None             General Pulmonary Education Topics:  Infection Prevention: - Provides verbal and written material to individual with discussion of infection control including proper hand washing and proper equipment cleaning during exercise session. Flowsheet Row Pulmonary Rehab from 04/01/2023 in [facility] Cardiac and Pulmonary Rehab  Date 04/01/23  Educator [facility]  Instruction Review Code 1- Verbalizes Understanding       Falls Prevention: - Provides verbal and written material to individual with discussion of falls prevention and safety. Flowsheet Row Pulmonary Rehab from 04/01/2023 in [facility] Cardiac and Pulmonary Rehab  Date 04/01/23  Educator [facility]  Instruction Review Code 1- Verbalizes Understanding       Chronic Lung Disease Review: - Group verbal instruction with posters, models, PowerPoint presentations and videos,  to review new updates, new respiratory medications, new advancements in procedures and treatments. Providing information on websites and "800" numbers for continued self-education. Includes information about supplement oxygen, available portable oxygen systems, continuous and intermittent flow rates, oxygen safety, concentrators, and Medicare reimbursement for oxygen. Explanation of Pulmonary Drugs, including class, frequency, complications, importance of spacers, rinsing mouth after steroid MDI's, and proper cleaning methods for nebulizers. Review of basic lung anatomy and physiology related  to function, structure, and complications of lung disease. Review of risk factors. Discussion about methods for diagnosing sleep apnea and types of masks and machines for OSA. Includes a review of the use of types of environmental controls: home humidity, furnaces, filters, dust mite/pet prevention, HEPA vacuums. Discussion about weather changes, air quality and the benefits of nasal washing. Instruction on Warning signs, infection symptoms, calling MD promptly, preventive modes, and value of vaccinations. Review of effective airway clearance, coughing and/or vibration techniques. Emphasizing that all should Create an Action Plan. Written material given at graduation.   AED/CPR: - Group verbal and written instruction with the use of models to demonstrate the basic use of the AED with the basic ABC's of resuscitation.    Anatomy and Cardiac Procedures: - Group verbal and visual presentation and models provide information about basic cardiac anatomy and function. Reviews the testing methods done to diagnose heart disease and the outcomes of the test results. Describes the treatment choices: Medical Management, Angioplasty, or Coronary Bypass Surgery for treating various heart conditions including Myocardial Infarction, Angina, Valve Disease, and Cardiac Arrhythmias.  Written material given at graduation.   Medication Safety: - Group verbal and visual instruction to review commonly prescribed medications for heart and lung disease. Reviews the medication, class of the drug, and side effects. Includes the steps to properly store meds and maintain the prescription regimen.  Written material given at graduation.   Other: -Provides group and verbal instruction on various topics (see comments)   Knowledge Questionnaire Score:    Core Components/Risk Factors/Patient Goals at Admission:  Personal Goals and Risk Factors at Admission - 04/01/23 0946       Core Components/Risk Factors/Patient Goals on  Admission  Weight Management Yes    Intervention Weight Management: Develop a combined nutrition and exercise program designed to reach desired caloric intake, while maintaining appropriate intake of nutrient and fiber, sodium and fats, and appropriate energy expenditure required for the weight goal.;Weight Management: Provide education and appropriate resources to help participant work on and attain dietary goals.;Weight Management/Obesity: Establish reasonable short term and long term weight goals.    Expected Outcomes Long Term: Adherence to nutrition and physical activity/exercise program aimed toward attainment of established weight goal;Short Term: Continue to assess and modify interventions until short term weight is achieved;Weight Loss: Understanding of general recommendations for a balanced deficit meal plan, which promotes 1-2 lb weight loss per week and includes a negative energy balance of [phone] kcal/d;Understanding recommendations for meals to include 15-35% energy as protein, 25-35% energy from fat, 35-60% energy from carbohydrates, less than 200mg  of dietary cholesterol, 20-35 gm of total fiber daily;Understanding of distribution of calorie intake throughout the day with the consumption of 4-5 meals/snacks    Improve shortness of breath with ADL's Yes    Intervention Provide education, individualized exercise plan and daily activity instruction to help decrease symptoms of SOB with activities of daily living.    Expected Outcomes Short Term: Improve cardiorespiratory fitness to achieve a reduction of symptoms when performing ADLs;Long Term: Be able to perform more ADLs without symptoms or delay the onset of symptoms    Diabetes Yes    Intervention Provide education about signs/symptoms and action to take for hypo/hyperglycemia.;Provide education about proper nutrition, including hydration, and aerobic/resistive exercise prescription along with prescribed medications to achieve blood  glucose in normal ranges: Fasting glucose 65-99 mg/dL    Expected Outcomes Short Term: Participant verbalizes understanding of the signs/symptoms and immediate care of hyper/hypoglycemia, proper foot care and importance of medication, aerobic/resistive exercise and nutrition plan for blood glucose control.;Long Term: Attainment of HbA1C < 7%.    Hypertension Yes    Intervention Provide education on lifestyle modifcations including regular physical activity/exercise, weight management, moderate sodium restriction and increased consumption of fresh fruit, vegetables, and low fat dairy, alcohol moderation, and smoking cessation.;Monitor prescription use compliance.    Expected Outcomes Short Term: Continued assessment and intervention until BP is < 140/32mm HG in hypertensive participants. < 130/31mm HG in hypertensive participants with diabetes, heart failure or chronic kidney disease.;Long Term: Maintenance of blood pressure at goal levels.    Lipids Yes    Intervention Provide education and support for participant on nutrition & aerobic/resistive exercise along with prescribed medications to achieve LDL 70mg , HDL >40mg .    Expected Outcomes Short Term: Participant states understanding of desired cholesterol values and is compliant with medications prescribed. Participant is following exercise prescription and nutrition guidelines.;Long Term: Cholesterol controlled with medications as prescribed, with individualized exercise RX and with personalized nutrition plan. Value goals: LDL < 70mg , HDL > 40 mg.             Education:Diabetes - Individual verbal and written instruction to review signs/symptoms of diabetes, desired ranges of glucose level fasting, after meals and with exercise. Acknowledge that pre and post exercise glucose checks will be done for 3 sessions at entry of program. Flowsheet Row Pulmonary Rehab from 04/01/2023 in [facility] Cardiac and Pulmonary Rehab  Date 04/01/23  Educator [facility]   Instruction Review Code 1- Verbalizes Understanding       Know Your Numbers and Heart Failure: - Group verbal and visual instruction to discuss disease risk factors for cardiac and pulmonary disease and  treatment options.  Reviews associated critical values for Overweight/Obesity, Hypertension, Cholesterol, and Diabetes.  Discusses basics of heart failure: signs/symptoms and treatments.  Introduces Heart Failure Zone chart for action plan for heart failure.  Written material given at graduation.   Core Components/Risk Factors/Patient Goals Review:    Core Components/Risk Factors/Patient Goals at Discharge (Final Review):    ITP Comments:  ITP Comments     Row Name 04/01/23 0945 04/02/23 1426 04/08/23 0936 04/10/23 0937     ITP Comments Virtual Visit completed. Patient informed on EP and RD appointment and 6 Minute walk test. Patient also informed of patient health questionnaires on My Chart. Patient Verbalizes understanding. Visit diagnosis can be found in [facility] 01/13/2023. Completed and gym orientation. Initial ITP created and sent for review to Dr. Jinny Sanders, Medical Director. First full day of exercise!  Patient was oriented to gym and equipment including functions, settings, policies, and procedures.  Patient's individual exercise prescription and treatment plan were reviewed.  All starting workloads were established based on the results of the 6 minute walk test done at initial orientation visit.  The plan for exercise progression was also introduced and progression will be customized based on patient's performance and goals. 30 Day review completed. Medical Director ITP review done, changes made as directed, and signed approval by Medical Director.    new to program             Comments:

## 2023-04-22 ENCOUNTER — Encounter: Payer: Medicaid Other | Admitting: *Deleted

## 2023-04-26 ENCOUNTER — Encounter: Payer: Medicaid Other | Attending: Thoracic Surgery | Admitting: *Deleted

## 2023-04-26 DIAGNOSIS — J449 Chronic obstructive pulmonary disease, unspecified: Secondary | ICD-10-CM | POA: Diagnosis present

## 2023-04-26 NOTE — Progress Notes (Signed)
 Daily Session Note  Patient Details  Name: Jenna Tyler MRN: [id number] Date of Birth: 1966-04-16 Referring Provider:   Flowsheet Row Pulmonary Rehab from 04/02/2023 in [facility] Cardiac and Pulmonary Rehab  Referring Provider Dr. Donnice Moris       Encounter Date: 04/26/2023  Check In:  Session Check In - 04/26/23 1055       Check-In   Supervising physician immediately available to respond to emergencies See telemetry face sheet for immediately available ER MD    Location [facility]-Cardiac & Pulmonary Rehab    Staff Present Othel Durand, RN, BSN, CCRP;Noah Tickle, BS, Exercise Physiologist;Maxon Conetta BS, , Exercise Physiologist    Virtual Visit No    Medication changes reported     No    Fall or balance concerns reported    No    Warm-up and Cool-down Performed on first and last piece of equipment    Resistance Training Performed Yes    VAD Patient? No    PAD/SET Patient? No      Pain Assessment   Currently in Pain? No/denies                Social History   Tobacco Use  Smoking Status Former   Current packs/day: 0.00   Average packs/day: 1 pack/day for 15.0 years (15.0 ttl pk-yrs)   Types: Cigarettes   Start date: 12/29/1982   Quit date: 12/28/1997   Years since quitting: 25.3  Smokeless Tobacco Never    Goals Met:  Proper associated with RPD/PD & O2 Sat Independence with exercise equipment Exercise tolerated well No report of concerns or symptoms today  Goals Unmet:  Not Applicable  Comments: Pt able to follow exercise prescription today without complaint.  Will continue to monitor for progression.    Dr. Oneil Pinal is Medical Director for [facility] Cardiac Rehabilitation.  Dr. Fuad Aleskerov is Medical Director for [facility] Pulmonary Rehabilitation.

## 2023-04-29 ENCOUNTER — Encounter: Payer: Medicaid Other | Admitting: *Deleted

## 2023-04-29 DIAGNOSIS — J449 Chronic obstructive pulmonary disease, unspecified: Secondary | ICD-10-CM

## 2023-04-29 NOTE — Progress Notes (Signed)
 Daily Session Note  Patient Details  Name: Korey Arroyo MRN: [id number] Date of Birth: 01-Dec-1965 Referring Provider:   Flowsheet Row Pulmonary Rehab from 04/02/2023 in [facility] Cardiac and Pulmonary Rehab  Referring Provider Dr. Donnice Moris       Encounter Date: 04/29/2023  Check In:  Session Check In - 04/29/23 0944       Check-In   Supervising physician immediately available to respond to emergencies See telemetry face sheet for immediately available ER MD    Location [facility]-Cardiac & Pulmonary Rehab    Staff Present Rollene Paterson, MS, Exercise Physiologist;Maxon Burnell HECKLE, , Exercise Physiologist;Kelly Dyane, BS, ACSM CEP, Exercise Physiologist;Genesis Novosad Claudene, RN, ADN    Virtual Visit No    Medication changes reported     No    Fall or balance concerns reported    No    Warm-up and Cool-down Performed on first and last piece of equipment    Resistance Training Performed Yes    VAD Patient? No    PAD/SET Patient? No      Pain Assessment   Currently in Pain? No/denies                Social History   Tobacco Use  Smoking Status Former   Current packs/day: 0.00   Average packs/day: 1 pack/day for 15.0 years (15.0 ttl pk-yrs)   Types: Cigarettes   Start date: 12/29/1982   Quit date: 12/28/1997   Years since quitting: 25.3  Smokeless Tobacco Never    Goals Met:  Independence with exercise equipment Exercise tolerated well No report of concerns or symptoms today Strength training completed today  Goals Unmet:  Not Applicable  Comments: Pt able to follow exercise prescription today without complaint.  Will continue to monitor for progression.    Dr. Oneil Pinal is Medical Director for [facility] Cardiac Rehabilitation.  Dr. Fuad Aleskerov is Medical Director for [facility] Pulmonary Rehabilitation.

## 2023-05-01 ENCOUNTER — Encounter: Payer: Medicaid Other | Admitting: *Deleted

## 2023-05-01 DIAGNOSIS — J449 Chronic obstructive pulmonary disease, unspecified: Secondary | ICD-10-CM | POA: Diagnosis not present

## 2023-05-01 NOTE — Progress Notes (Signed)
 Daily Session Note  Patient Details  Name: Leonette Tischer MRN: [id number] Date of Birth: 12/17/1965 Referring Provider:   Flowsheet Row Pulmonary Rehab from 04/02/2023 in [facility] Cardiac and Pulmonary Rehab  Referring Provider Dr. Donnice Moris       Encounter Date: 05/01/2023  Check In:  Session Check In - 05/01/23 0916       Check-In   Supervising physician immediately available to respond to emergencies See telemetry face sheet for immediately available ER MD    Location [facility]-Cardiac & Pulmonary Rehab    Staff Present Devaughn Jaeger, BS, Exercise Physiologist;Susanne Bice, RN, BSN, CCRP;Joseph Worthington, NORWOOD HARMAN Arzella Claudene, RN, [place]    Virtual Visit No    Medication changes reported     No    Fall or balance concerns reported    No    Warm-up and Cool-down Performed on first and last piece of equipment    Resistance Training Performed Yes    VAD Patient? No    PAD/SET Patient? No      Pain Assessment   Currently in Pain? No/denies                Social History   Tobacco Use  Smoking Status Former   Current packs/day: 0.00   Average packs/day: 1 pack/day for 15.0 years (15.0 ttl pk-yrs)   Types: Cigarettes   Start date: 12/29/1982   Quit date: 12/28/1997   Years since quitting: 25.3  Smokeless Tobacco Never    Goals Met:  Independence with exercise equipment Exercise tolerated well No report of concerns or symptoms today Strength training completed today  Goals Unmet:  Not Applicable  Comments: Pt able to follow exercise prescription today without complaint.  Will continue to monitor for progression.    Dr. Oneil Pinal is Medical Director for [facility] Cardiac Rehabilitation.  Dr. Fuad Aleskerov is Medical Director for [facility] Pulmonary Rehabilitation.

## 2023-05-06 ENCOUNTER — Encounter: Payer: Medicaid Other | Admitting: *Deleted

## 2023-05-06 DIAGNOSIS — J449 Chronic obstructive pulmonary disease, unspecified: Secondary | ICD-10-CM

## 2023-05-06 NOTE — Progress Notes (Signed)
 Daily Session Note  Patient Details  Name: Jenna Tyler MRN: [id number] Date of Birth: 1965/08/30 Referring Provider:   Flowsheet Row Pulmonary Rehab from 04/02/2023 in [facility] Cardiac and Pulmonary Rehab  Referring Provider Dr. Donnice Moris       Encounter Date: 05/06/2023  Check In:  Session Check In - 05/06/23 0933       Check-In   Supervising physician immediately available to respond to emergencies See telemetry face sheet for immediately available ER MD    Location [facility]-Cardiac & Pulmonary Rehab    Staff Present Rollene Paterson, MS, Exercise Physiologist;Joseph Rolinda, RCP,RRT,BSRT;Maxon Edmonson BS, , Exercise Physiologist;Cylus [place] Dyane, BS, ACSM CEP, Exercise Physiologist;Other   Burnard Davenport, RN   Virtual Visit No    Medication changes reported     No    Fall or balance concerns reported    No    Tobacco Cessation No Change    Warm-up and Cool-down Performed on first and last piece of equipment    Resistance Training Performed Yes    VAD Patient? No    PAD/SET Patient? No      Pain Assessment   Currently in Pain? No/denies                Social History   Tobacco Use  Smoking Status Former   Current packs/day: 0.00   Average packs/day: 1 pack/day for 15.0 years (15.0 ttl pk-yrs)   Types: Cigarettes   Start date: 12/29/1982   Quit date: 12/28/1997   Years since quitting: 25.3  Smokeless Tobacco Never    Goals Met:  Independence with exercise equipment Exercise tolerated well No report of concerns or symptoms today Strength training completed today  Goals Unmet:  Not Applicable  Comments: Pt able to follow exercise prescription today without complaint. Will continue to monitor for progression.    Dr. Oneil Pinal is Medical Director for [facility] Cardiac Rehabilitation.  Dr. Fuad Aleskerov is Medical Director for [facility] Pulmonary Rehabilitation.

## 2023-05-08 ENCOUNTER — Encounter: Payer: Medicaid Other | Admitting: *Deleted

## 2023-05-08 ENCOUNTER — Encounter: Payer: Self-pay | Admitting: *Deleted

## 2023-05-08 DIAGNOSIS — J449 Chronic obstructive pulmonary disease, unspecified: Secondary | ICD-10-CM | POA: Diagnosis not present

## 2023-05-08 NOTE — Progress Notes (Signed)
 Pulmonary Individual Treatment Plan  Patient Details  Name: Jenna Tyler MRN: [id number] Date of Birth: [birth year] Referring Provider:   Flowsheet Row Pulmonary Rehab from 04/02/2023 in [facility] Pulmonary Rehab  Referring Provider Dr. Elgin Grit       Initial Encounter Date:  Flowsheet Row Pulmonary Rehab from 04/02/2023 in [facility] Card[facility] and Pulmonary Rehab  Date 04/02/23       Visit Diagnosis: Chronic obstructive pulmonary disease, unspecified COPD type (HCC)  Patient's Home Medications on Admission:  Current Outpatient Medications:    albuterol  (PROVENTIL ) (2.5 MG/3ML) 0.083% nebulizer solution, Inhale into the lungs., Disp: , Rfl:    albuterol  (VENTOLIN  HFA) 108 (90 Base) MCG/ACT inhaler, Inhale 1-2 puffs into the lungs every 4 (four) hours as needed. (Patient not taking: Reported on 04/01/2023), Disp: , Rfl:    Blood Glucose Monitoring Suppl (GLUCOCOM BLOOD GLUCOSE MONITOR) DEVI, 1 each as directed, Disp: , Rfl:    budesonide  (PULMICORT ) 0.25 MG/2ML nebulizer solution, Inhale into the lungs., Disp: , Rfl:    butalbital -acetaminophen -caffeine  (FIORICET ) 50-325-40 MG tablet, Take 1 tablet by mouth every 4 (four) hours as needed for headache., Disp: 14 tablet, Rfl: 0   chlorpheniramine-HYDROcodone  (TUSSIONEX) 10-8 MG/5ML, Take 5 mLs by mouth every 12 (twelve) hours., Disp: 70 mL, Rfl: 0   Cholecalciferol 50 MCG (2000 UT) TABS, Take by mouth. (Patient not taking: Reported on 04/01/2023), Disp: , Rfl:    Cysteamine Bitartrate (PROCYSBI) 300 MG PACK, Use 1 each once daily Use as instructed., Disp: , Rfl:    dicyclomine  (BENTYL ) 20 MG tablet, Take 1 tablet (20 mg total) by mouth every 8 (eight) hours as needed., Disp: 15 tablet, Rfl: 0   doxycycline  (VIBRAMYCIN ) 100 MG capsule, SMARTSIG:1.0 Capsule(s) By Mouth Twice Daily, Disp: , Rfl:    gabapentin  (NEURONTIN ) 800 MG tablet, Take 800 mg by mouth 2 (two) times daily., Disp: , Rfl:    glucose blood (ACCU-CHEK GUIDE  TEST) test strip, 1 each (1 strip total) once daily Use as instructed., Disp: , Rfl:    HYDROcodone -acetaminophen  (NORCO/VICODIN) 5-325 MG tablet, Take 1 tablet by mouth every 4 (four) hours as needed., Disp: 15 tablet, Rfl: 0   hydrOXYzine  (ATARAX ) 25 MG tablet, Take by mouth. (Patient not taking: Reported on 04/01/2023), Disp: , Rfl:    ibuprofen (ADVIL) 200 MG tablet, Take by mouth., Disp: , Rfl:    levothyroxine  (SYNTHROID ) 100 MCG tablet, Take 100 mcg by mouth daily., Disp: , Rfl:    meclizine  (ANTIVERT ) 25 MG tablet, Take 25mg  1-3 times daily as needed (Patient not taking: Reported on 04/01/2023), Disp: , Rfl:    metFORMIN (GLUCOPHAGE) 500 MG tablet, Take 500 mg by mouth 2 (two) times daily. (Patient not taking: Reported on 04/01/2023), Disp: , Rfl:    methocarbamol  (ROBAXIN ) 750 MG tablet, Take 750 mg by mouth 2 (two) times daily. (Patient not taking: Reported on 04/01/2023), Disp: , Rfl:    metoprolol  tartrate (LOPRESSOR ) 25 MG tablet, Take 25 mg by mouth every morning., Disp: , Rfl:    Multiple Vitamin (MULTIVITAMIN WITH MINERALS) TABS tablet, Take 1 tablet by mouth daily. (Patient not taking: Reported on 04/01/2023), Disp: , Rfl:    nortriptyline (PAMELOR) 10 MG capsule, , Disp: , Rfl:    omeprazole  (PRILOSEC ) 40 MG capsule, Take by mouth., Disp: , Rfl:    ondansetron  (ZOFRAN ) 4 MG tablet, Take 8 mg by mouth 2 (two) times daily., Disp: , Rfl:    ondansetron  (ZOFRAN -ODT) 4 MG disintegrating tablet, Take 1 tablet (  4 mg total) by mouth every 6 (six) hours as needed for nausea or vomiting. (Patient not taking: Reported on 04/01/2023), Disp: 20 tablet, Rfl: 0   polyethylene glycol (MIRALAX ) 17 g packet, Take 17 g by mouth daily. (Patient not taking: Reported on 04/01/2023), Disp: 30 each, Rfl: 0   rOPINIRole  (REQUIP ) 0.5 MG tablet, Take 1 mg by mouth 2 (two) times daily., Disp: , Rfl:    SYMBICORT  160-4.5 MCG/ACT inhaler, Inhale 2 puffs into the lungs 2 (two) times daily., Disp: , Rfl:   Past Medical  History: Past Medical History:  Diagnosis Date   AKI (acute kidney injury) (HCC) 07/12/2017   Asthma    COPD (chronic obstructive pulmonary disease) (HCC)    COPD exacerbation (HCC) 12/29/2016   COPD with acute exacerbation (HCC) 10/02/2022   DM (diabetes mellitus), type 2 (HCC)    Dyspnea    Fibromyalgia    Guillain Barr syndrome (HCC)    Headache    Hyperlipidemia    Hypertension    Hypothyroidism    Neurofibromatosis, peripheral, NF1 (HCC) 2017   Pancreatitis    PONV (postoperative nausea and vomiting)    Pulmonary nodule    RLS (restless legs syndrome)    Sleep apnea    never went to pick up her cpap   Tachycardia    Tracheomalacia     Tobacco Use: Social History   Tobacco Use  Smoking Status Former   Current packs/day: 0.00   Average packs/day: 1 pack/day for 15.0 years (15.0 ttl pk-yrs)   Types: Cigarettes   Start date: 12/29/1982   Quit date: 12/28/1997   Years since quitting: 25.3  Smokeless Tobacco Never    Labs: Review Flowsheet  More data may exist      Latest Ref Rng & Units 06/27/2015 12/29/2016 07/09/2019 07/10/2019 10/04/2021  Labs for ITP Cardiac and Pulmonary Rehab  Cholestrol 0 - 200 mg/dL - 409  - - -  LDL (calc) 0 - 99 mg/dL - 811  - - -  HDL-C >91 mg/dL - 47  - - -  Trlycerides <150 mg/dL - 478  - - -  Hemoglobin A1c 4.8 - 5.6 % 5.9  5.7  - 6.3  6.2   PH, Arterial 7.350 - 7.450 - - 7.39  - -  PCO2 arterial 32.0 - 48.0 mmHg - - 30  - -  Bicarbonate 20.0 - 28.0 mmol/L - - 18.2  - -  Acid-base deficit 0.0 - 2.0 mmol/L - - 5.6  - -  O2 Saturation % - - 97.7  - -     Pulmonary Assessment Scores:   UCSD: Self-administered rating of dyspnea associated with activities of daily living (ADLs) 6-point scale (0 = "not at all" to 5 = "maximal or unable to do because of breathlessness")  Scoring Scores range from 0 to 120.  Minimally important difference is 5 units  CAT: CAT can identify the health impairment of COPD patients and is better correlated  with disease progression.  CAT has a scoring range of zero to 40. The CAT score is classified into four groups of low (less than 10), medium (10 - 20), high (21-30) and very high (31-40) based on the impact level of disease on health status. A CAT score over 10 suggests significant symptoms.  A worsening CAT score could be explained by an exacerbation, poor medication adherence, poor inhaler technique, or progression of COPD or comorbid conditions.  CAT MCID is 2 points  mMRC: mMRC (Modified Medical  Research Council) Dyspnea Scale is used to assess the degree of baseline functional disability in patients of respiratory disease due to dyspnea. No minimal important difference is established. A decrease in score of 1 point or greater is considered a positive change.   Pulmonary Function Assessment:  Pulmonary Function Assessment - 04/01/23 0946       Breath   Shortness of Breath Yes;Limiting activity             Exercise Target Goals: Exercise Program Goal: Individual exercise prescription set using results from initial 6 min walk test and THRR while considering  patient's activity barriers and safety.   Exercise Prescription Goal: Initial exercise prescription builds to 30-45 minutes a day of aerobic activity, 2-3 days per week.  Home exercise guidelines will be given to patient during program as part of exercise prescription that the participant will acknowledge.  Education: Aerobic Exercise: - Group verbal and visual presentation on the components of exercise prescription. Introduces F.I.T.T principle from ACSM for exercise prescriptions.  Reviews F.I.T.T. principles of aerobic exercise including progression. Written material given at graduation.   Education: Resistance Exercise: - Group verbal and visual presentation on the components of exercise prescription. Introduces F.I.T.T principle from ACSM for exercise prescriptions  Reviews F.I.T.T. principles of resistance exercise  including progression. Written material given at graduation.    Education: Exercise & Equipment Safety: - Individual verbal instruction and demonstration of equipment use and safety with use of the equipment. Flowsheet Row Pulmonary Rehab from 05/08/2023 in [facility] Card[facility] Pulmonary Rehab  Date 04/01/23  Educator jh  Instruction Review Code 1- Verbalizes Understanding       Education: Exercise Physiology & General Exercise Guidelines: - Group verbal and written instruction with models to review the exercise physiology of the cardiovascular system and associated critical values. Provides general exercise guidelines with specific guidelines to those with heart or lung disease.    Education: Flexibility, Balance, Mind/Body Relaxation: - Group verbal and visual presentation with interactive activity on the components of exercise prescription. Introduces F.I.T.T principle from ACSM for exercise prescriptions. Reviews F.I.T.T. principles of flexibility and balance exercise training including progression. Also discusses the mind body connection.  Reviews various relaxation techniques to help reduce and manage stress (i.e. Deep breathing, progressive muscle relaxation, and visualization). Balance handout provided to take home. Written material given at graduation.   Activity Barriers & Risk Stratification:  Activity Barriers & Cardiac Risk Stratification - 04/02/23 1431       Activity Barriers & Cardiac Risk Stratification   Activity Barriers Fibromyalgia;Muscular Weakness;Shortness of Breath;Assistive Device   Cane            6 Minute Walk:  6 Minute Walk     Row Name 04/02/23 1427         6 Minute Walk   Phase Initial     Distance 1180 feet     Walk Time 6 minutes     # of Rest Breaks 0     MPH 2.2     METS 3.23     RPE 17     Perceived Dyspnea  3     VO2 Peak 11.3     Symptoms No     Resting HR 110 bpm     Resting BP 92/64     Resting Oxygen Saturation  95 %      Exercise Oxygen Saturation  during 6 min walk 89 %     Max Ex. HR 126 bpm  Max Ex. BP 106/64     2 Minute Post BP 90/64       Interval HR   1 Minute HR 116     2 Minute HR 126     3 Minute HR 125     4 Minute HR 121     5 Minute HR 125     6 Minute HR 126     2 Minute Post HR 118     Interval Heart Rate? Yes       Interval Oxygen   Interval Oxygen? Yes     Baseline Oxygen Saturation % 95 %     1 Minute Oxygen Saturation % 95 %     2 Minute Oxygen Saturation % 93 %     3 Minute Oxygen Saturation % 91 %     4 Minute Oxygen Saturation % 89 %     5 Minute Oxygen Saturation % 97 %     6 Minute Oxygen Saturation % 98 %     2 Minute Post Oxygen Saturation % 99 %             Oxygen Initial Assessment:  Oxygen Initial Assessment - 04/01/23 0946       Home Oxygen   Home Oxygen Device None    Sleep Oxygen Prescription None    Home Exercise Oxygen Prescription None    Home Resting Oxygen Prescription None      Initial 6 min Walk   Oxygen Used None      Program Oxygen Prescription   Program Oxygen Prescription None      Intervention   Short Term Goals To learn and exhibit compliance with exercise, home and travel O2 prescription;To learn and understand importance of monitoring SPO2 with pulse oximeter and demonstrate accurate use of the pulse oximeter.;To learn and understand importance of maintaining oxygen saturations>88%;To learn and demonstrate proper pursed lip breathing techniques or other breathing techniques. ;To learn and demonstrate proper use of respiratory medications    Long  Term Goals Exhibits compliance with exercise, home  and travel O2 prescription;Verbalizes importance of monitoring SPO2 with pulse oximeter and return demonstration;Maintenance of O2 saturations>88%;Exhibits proper breathing techniques, such as pursed lip breathing or other method taught during program session;Compliance with respiratory medication;Demonstrates proper use of MDI's              Oxygen Re-Evaluation:  Oxygen Re-Evaluation     Row Name 04/08/23 8657 04/29/23 0959           Program Oxygen Prescription   Program Oxygen Prescription -- None        Home Oxygen   Home Oxygen Device -- None      Sleep Oxygen Prescription -- CPAP  Patient has recently been diagnosed with sleep apnea and is waiting for her CPAP machine to arrive.      Home Exercise Oxygen Prescription -- None      Home Resting Oxygen Prescription -- None        Goals/Expected Outcomes   Short Term Goals -- To learn and exhibit compliance with exercise, home and travel O2 prescription;To learn and understand importance of monitoring SPO2 with pulse oximeter and demonstrate accurate use of the pulse oximeter.;To learn and understand importance of maintaining oxygen saturations>88%;To learn and demonstrate proper pursed lip breathing techniques or other breathing techniques. ;To learn and demonstrate proper use of respiratory medications      Long  Term Goals -- Exhibits compliance with exercise, home  and travel  O2 prescription;Verbalizes importance of monitoring SPO2 with pulse oximeter and return demonstration;Maintenance of O2 saturations>88%;Exhibits proper breathing techniques, such as pursed lip breathing or other method taught during program session;Compliance with respiratory medication;Demonstrates proper use of MDI's      Comments Reviewed PLB technique with pt.  Talked about how it works and it's importance in maintaining their exercise saturations. Patient has recently completed a sleep study and has been diagnosed with sleep apnea. She is waiting for her CPAP machine to arrive. She will start using that as soon as she gets it. She does continue to say that her SOB is her main limiting factor with exercise and activity. She does have a pulse ox and moniotrs oxygen saturations at home.      Goals/Expected Outcomes Short: Become more profiecient at using PLB. Long: Become independent at using  PLB. Short: obtain CPAP machine and start using it during sleep. Long: become independent with using PLB.               Oxygen Discharge (Final Oxygen Re-Evaluation):  Oxygen Re-Evaluation - 04/29/23 0959       Program Oxygen Prescription   Program Oxygen Prescription None      Home Oxygen   Home Oxygen Device None    Sleep Oxygen Prescription CPAP   Patient has recently been diagnosed with sleep apnea and is waiting for her CPAP machine to arrive.   Home Exercise Oxygen Prescription None    Home Resting Oxygen Prescription None      Goals/Expected Outcomes   Short Term Goals To learn and exhibit compliance with exercise, home and travel O2 prescription;To learn and understand importance of monitoring SPO2 with pulse oximeter and demonstrate accurate use of the pulse oximeter.;To learn and understand importance of maintaining oxygen saturations>88%;To learn and demonstrate proper pursed lip breathing techniques or other breathing techniques. ;To learn and demonstrate proper use of respiratory medications    Long  Term Goals Exhibits compliance with exercise, home  and travel O2 prescription;Verbalizes importance of monitoring SPO2 with pulse oximeter and return demonstration;Maintenance of O2 saturations>88%;Exhibits proper breathing techniques, such as pursed lip breathing or other method taught during program session;Compliance with respiratory medication;Demonstrates proper use of MDI's    Comments Patient has recently completed a sleep study and has been diagnosed with sleep apnea. She is waiting for her CPAP machine to arrive. She will start using that as soon as she gets it. She does continue to say that her SOB is her main limiting factor with exercise and activity. She does have a pulse ox and moniotrs oxygen saturations at home.    Goals/Expected Outcomes Short: obtain CPAP machine and start using it during sleep. Long: become independent with using PLB.             Initial  Exercise Prescription:  Initial Exercise Prescription - 04/02/23 1400       Date of Initial Exercise RX and Referring Provider   Date 04/02/23    Referring Provider Dr. Elgin Grit      Oxygen   Maintain Oxygen Saturation 88% or higher      Treadmill   MPH 2.2    Grade 0    Minutes 15    METs 2.68      Recumbant Bike   Level 2    RPM 50    Watts 25    Minutes 15    METs 3.23      NuStep   Level 2    SPM  80    Minutes 15    METs 3.23      Arm Ergometer   Level 1    Watts 25    Minutes 15    METs 3.23      T5 Nustep   Level 2    SPM 80    Minutes 15    METs 3.23      Biostep-RELP   Level 2    SPM 50    Minutes 15    METs 3.23      Track   Laps 41    Minutes 15    METs 3.23      Prescription Details   Duration Progress to 30 minutes of continuous aerobic without signs/symptoms of physical distress      Intensity   THRR 40-80% of Max Heartrate 131-152    Ratings of Perceived Exertion 11-13    Perceived Dyspnea 0-4      Progression   Progression Continue progressive overload as per policy without signs/symptoms or physical distress.      Resistance Training   Training Prescription Yes    Weight 3 lb    Reps 10-15             Perform Capillary Blood Glucose checks as needed.  Exercise Prescription Changes:   Exercise Prescription Changes     Row Name 04/02/23 1400 04/16/23 0800 05/01/23 1100         Response to Exercise   Blood Pressure (Admit) 92/64 106/70 108/82     Blood Pressure (Exercise) 106/64 118/72 132/62     Blood Pressure (Exit) 90/64 120/70 108/60     Heart Rate (Admit) 110 bpm 87 bpm 98 bpm     Heart Rate (Exercise) 126 bpm 102 bpm 110 bpm     Heart Rate (Exit) 118 bpm 103 bpm 95 bpm     Oxygen Saturation (Admit) 95 % 96 % 94 %     Oxygen Saturation (Exercise) 89 % 98 % 92 %     Oxygen Saturation (Exit) 99 % 96 % 92 %     Rating of Perceived Exertion (Exercise) 17 15 15      Perceived Dyspnea (Exercise) 3 3 3       Symptoms none none none     Comments results First full day of exercise First full day of exercise     Duration -- Progress to 30 minutes of  aerobic without signs/symptoms of physical distress Progress to 30 minutes of  aerobic without signs/symptoms of physical distress     Intensity -- THRR unchanged THRR unchanged       Progression   Progression -- Continue to progress workloads to maintain intensity without signs/symptoms of physical distress. Continue to progress workloads to maintain intensity without signs/symptoms of physical distress.     Average METs -- 2.55 2.55       Resistance Training   Training Prescription -- Yes Yes     Weight -- 3 lb 3 lb     Reps -- 10-15 10-15       Interval Training   Interval Training -- No No       Oxygen   Oxygen -- -- Continuous       Treadmill   MPH -- -- 2.2     Grade -- -- 0     Minutes -- -- 15     METs -- -- 2.69       Recumbant Bike   Level --  2 --     Watts -- 19 --     Minutes -- 15 --     METs -- 2.7 --       NuStep   Level -- 3 --     Minutes -- 15 --     METs -- 2.4 --       T5 Nustep   Level -- -- 3     Minutes -- -- 15     METs -- -- 2       Oxygen   Maintain Oxygen Saturation -- 88% or higher 88% or higher              Exercise Comments:   Exercise Comments     Row Name 04/08/23 0936           Exercise Comments First full day of exercise!  Patient was oriented to gym and equipment including functions, settings, policies, and procedures.  Patient's individual exercise prescription and treatment plan were reviewed.  All starting workloads were established based on the results of the 6 minute walk test done at initial orientation visit.  The plan for exercise progression was also introduced and progression will be customized based on patient's performance and goals.                Exercise Goals and Review:   Exercise Goals     Row Name 04/02/23 1435             Exercise Goals    Increase Physical Activity Yes       Intervention Provide advice, education, support and counseling about physical activity/exercise needs.;Develop an individualized exercise prescription for aerobic and resistive training based on initial evaluation findings, risk stratification, comorbidities and participant's personal goals.       Expected Outcomes Long Term: Add in home exercise to make exercise part of routine and to increase amount of physical activity.;Long Term: Exercising regularly at least 3-5 days a week.;Short Term: Attend rehab on a regular basis to increase amount of physical activity.       Increase Strength and Stamina Yes       Intervention Develop an individualized exercise prescription for aerobic and resistive training based on initial evaluation findings, risk stratification, comorbidities and participant's personal goals.;Provide advice, education, support and counseling about physical activity/exercise needs.       Expected Outcomes Long Term: Improve cardiorespiratory fitness, muscular endurance and strength as measured by increased METs and functional capacity ( );Short Term: Perform resistance training exercises routinely during rehab and add in resistance training at home;Short Term: Increase workloads from initial exercise prescription for resistance, speed, and METs.       Able to understand and use rate of perceived exertion (RPE) scale Yes       Intervention Provide education and explanation on how to use RPE scale       Expected Outcomes Long Term:  Able to use RPE to guide intensity level when exercising independently;Short Term: Able to use RPE daily in rehab to express subjective intensity level       Able to understand and use Dyspnea scale Yes       Intervention Provide education and explanation on how to use Dyspnea scale       Expected Outcomes Long Term: Able to use Dyspnea scale to guide intensity level when exercising independently;Short Term: Able to use  Dyspnea scale daily in rehab to express subjective sense of shortness of breath during exertion  Knowledge and understanding of Target Heart Rate Range (THRR) Yes       Intervention Provide education and explanation of THRR including how the numbers were predicted and where they are located for reference       Expected Outcomes Long Term: Able to use THRR to govern intensity when exercising independently;Short Term: Able to use daily as guideline for intensity in rehab;Short Term: Able to state/look up THRR       Able to check pulse independently Yes       Intervention Review the importance of being able to check your own pulse for safety during independent exercise;Provide education and demonstration on how to check pulse in carotid and radial arteries.       Expected Outcomes Long Term: Able to check pulse independently and accurately;Short Term: Able to explain why pulse checking is important during independent exercise       Understanding of Exercise Prescription Yes       Intervention Provide education, explanation, and written materials on patient's individual exercise prescription       Expected Outcomes Long Term: Able to explain home exercise prescription to exercise independently;Short Term: Able to explain program exercise prescription                Exercise Goals Re-Evaluation :  Exercise Goals Re-Evaluation     Row Name 04/08/23 0936 04/16/23 0817 05/01/23 1205         Exercise Goal Re-Evaluation   Exercise Goals Review Able to understand and use rate of perceived exertion (RPE) scale;Able to understand and use Dyspnea scale;Knowledge and understanding of Target Heart Rate Range (THRR);Understanding of Exercise Prescription Increase Physical Activity;Increase Strength and Stamina;Understanding of Exercise Prescription Increase Physical Activity;Increase Strength and Stamina;Understanding of Exercise Prescription     Comments Reviewed RPE and dyspnea scale, THR and program  prescription with pt today.  Pt voiced understanding and was given a copy of goals to take home. Lileigh is off to a good start in the program. She was able to work at level 3 on the T4 nustep and level 2 on the recumbent bike during her first day of exercise. She also did well with 3 lb hand weights for resistance training. We will continue to monitor her progress in the program. Asmara is doing well in rehab. She was able to use the treadmill at a speed of 2.75mph. She was also able to use the T4 nustep at level 3. We will continue to monitor her progress in the program.     Expected Outcomes Short: Use RPE daily to regulate intensity. Long: Follow program prescription in THR. Short: Continue to follow current exercise prescription. Long: Continue exercise to improve strength and stamina. Short: Continue to follow current exercise prescription. Long: Continue exercise to improve strength and stamina.              Discharge Exercise Prescription (Final Exercise Prescription Changes):  Exercise Prescription Changes - 05/01/23 1100       Response to Exercise   Blood Pressure (Admit) 108/82    Blood Pressure (Exercise) 132/62    Blood Pressure (Exit) 108/60    Heart Rate (Admit) 98 bpm    Heart Rate (Exercise) 110 bpm    Heart Rate (Exit) 95 bpm    Oxygen Saturation (Admit) 94 %    Oxygen Saturation (Exercise) 92 %    Oxygen Saturation (Exit) 92 %    Rating of Perceived Exertion (Exercise) 15    Perceived Dyspnea (Exercise) 3  Symptoms none    Comments First full day of exercise    Duration Progress to 30 minutes of  aerobic without signs/symptoms of physical distress    Intensity THRR unchanged      Progression   Progression Continue to progress workloads to maintain intensity without signs/symptoms of physical distress.    Average METs 2.55      Resistance Training   Training Prescription Yes    Weight 3 lb    Reps 10-15      Interval Training   Interval Training No       Oxygen   Oxygen Continuous      Treadmill   MPH 2.2    Grade 0    Minutes 15    METs 2.69      T5 Nustep   Level 3    Minutes 15    METs 2      Oxygen   Maintain Oxygen Saturation 88% or higher             Nutrition:  Target Goals: Understanding of nutrition guidelines, daily intake of sodium 1500mg , cholesterol 200mg , calories 30% from fat and 7% or less from saturated fats, daily to have 5 or more servings of fruits and vegetables.  Education: All About Nutrition: -Group instruction provided by verbal, written material, interactive activities, discussions, models, and posters to present general guidelines for heart healthy nutrition including fat, fiber, MyPlate, the role of sodium in heart healthy nutrition, utilization of the nutrition label, and utilization of this knowledge for meal planning. Follow up email sent as well. Written material given at graduation. Flowsheet Row Pulmonary Rehab from 05/08/2023 in [facility] Cardiac and Pulmonary Rehab  Date 05/08/23  Educator JG  Instruction Review Code 1- Verbalizes Understanding       Biometrics:  Pre Biometrics - 04/02/23 1435       Pre Biometrics   Height 5' 5.8" (1.671 m)    Weight 187 lb 1.6 oz (84.9 kg)    Waist Circumference 41 inches    Hip Circumference 44.5 inches    Waist to Hip Ratio 0.92 %    BMI (Calculated) 30.39    Single Leg Stand 7.15 seconds              Nutrition Therapy Plan and Nutrition Goals:   Nutrition Assessments:  MEDIFICTS Score Key: >=70 Need to make dietary changes  40-70 Heart Healthy Diet <= 40 Therapeutic Level Cholesterol Diet  Flowsheet Row Pulmonary Rehab from 04/02/2023 in [facility] Cardiac and Pulmonary Rehab  Picture Your Plate Total Score on Admission 52      Picture Your Plate Scores: <92 Unhealthy dietary pattern with much room for improvement. 41-50 Dietary pattern unlikely to meet recommendations for good health and room for improvement. 51-60 More healthful  dietary pattern, with some room for improvement.  >60 Healthy dietary pattern, although there may be some specific behaviors that could be improved.   Nutrition Goals Re-Evaluation:  Nutrition Goals Re-Evaluation     Row Name 04/29/23 1004             Goals   Comment Patient has not met with RD yet. She is interested in meeting for a nutrition appointment. Appointment was scheduled for one on one RD apt on 05/06/2023       Expected Outcome Short: meet with RD and set specific nutrition goals. Long: maintain heart healthy eating habits.  Nutrition Goals Discharge (Final Nutrition Goals Re-Evaluation):  Nutrition Goals Re-Evaluation - 04/29/23 1004       Goals   Comment Patient has not met with RD yet. She is interested in meeting for a nutrition appointment. Appointment was scheduled for one on one RD apt on 05/06/2023    Expected Outcome Short: meet with RD and set specific nutrition goals. Long: maintain heart healthy eating habits.             Psychosocial: Target Goals: Acknowledge presence or absence of significant depression and/or stress, maximize coping skills, provide positive support system. Participant is able to verbalize types and ability to use techniques and skills needed for reducing stress and depression.   Education: Stress, Anxiety, and Depression - Group verbal and visual presentation to define topics covered.  Reviews how body is impacted by stress, anxiety, and depression.  Also discusses healthy ways to reduce stress and to treat/manage anxiety and depression.  Written material given at graduation.   Education: Sleep Hygiene -Provides group verbal and written instruction about how sleep can affect your health.  Define sleep hygiene, discuss sleep cycles and impact of sleep habits. Review good sleep hygiene tips.    Initial Review & Psychosocial Screening:  Initial Psych Review & Screening - 04/01/23 0948       Initial Review    Current issues with Current Sleep Concerns      Family Dynamics   Good Support System? Yes    Comments She can look to her room mate and best friends for support. Sometimes she does not sleep well do to possible sleep apnea. Kinsly goes to a [place] group therapy for recovery.      Barriers   Psychosocial barriers to participate in program The patient should benefit from training in stress management and relaxation.      Screening Interventions   Interventions To provide support and resources with identified psychosocial needs;Encouraged to exercise;Provide feedback about the scores to participant    Expected Outcomes Short Term goal: Utilizing psychosocial counselor, staff and physician to assist with identification of specific Stressors or current issues interfering with healing process. Setting desired goal for each stressor or current issue identified.;Long Term Goal: Stressors or current issues are controlled or eliminated.;Short Term goal: Identification and review with participant of any Quality of Life or Depression concerns found by scoring the questionnaire.;Long Term goal: The participant improves quality of Life and PHQ9 Scores as seen by post scores and/or verbalization of changes             Quality of Life Scores:  Scores of 19 and below usually indicate a poorer quality of life in these areas.  A difference of  2-3 points is a clinically meaningful difference.  A difference of 2-3 points in the total score of the Quality of Life Index has been associated with significant improvement in overall quality of life, self-image, physical symptoms, and general health in studies assessing change in quality of life.  PHQ-9: Review Flowsheet       04/02/2023 07/07/2015 04/04/2015  Depression screen PHQ 2/9  Decreased Interest 0 0 0  Down, Depressed, Hopeless 0 0 0  PHQ - 2 Score 0 0 0  Altered sleeping 2 - -  Tired, decreased energy 1 - -  Change in appetite 0 - -  Feeling bad  or failure about yourself  0 - -  Trouble concentrating 1 - -  Moving slowly or fidgety/restless 0 - -  Suicidal thoughts  0 - -  PHQ-9 Score 4 - -  Difficult doing work/chores Somewhat difficult - -   Interpretation of Total Score  Total Score Depression Severity:  1-4 = Minimal depression, 5-9 = Mild depression, 10-14 = Moderate depression, 15-19 = Moderately severe depression, 20-27 = Severe depression   Psychosocial Evaluation and Intervention:  Psychosocial Evaluation - 04/01/23 0951       Psychosocial Evaluation & Interventions   Interventions Relaxation education;Stress management education;Encouraged to exercise with the program and follow exercise prescription    Comments She can look to her room mate and best friends for support. Sometimes she does not sleep well do to possible sleep apnea. Rayshawn goes to a [place] group therapy for recovery.    Expected Outcomes Short: Start [facility] to help with mood. Long: Maintain a healthy mental state.    Continue Psychosocial Services  Follow up required by staff             Psychosocial Re-Evaluation:  Psychosocial Re-Evaluation     Row Name 04/29/23 1003             Psychosocial Re-Evaluation   Current issues with Current Sleep Concerns       Comments Patient has recently completed a sleep study and has been diagnosed with sleep apnea. She is waiting for her CPAP machine to arrive. She will start using that as soon as she gets it. She does continue to say that her SOB is her main limiting factor with exercise and activity.       Expected Outcomes Short: obtain CPAP machine and start using it to aid in sleep quality. Long: maintain good sleep patterns long term.       Interventions Encouraged to attend Pulmonary Rehabilitation for the exercise       Continue Psychosocial Services  Follow up required by staff                Psychosocial Discharge (Final Psychosocial Re-Evaluation):  Psychosocial Re-Evaluation -  04/29/23 1003       Psychosocial Re-Evaluation   Current issues with Current Sleep Concerns    Comments Patient has recently completed a sleep study and has been diagnosed with sleep apnea. She is waiting for her CPAP machine to arrive. She will start using that as soon as she gets it. She does continue to say that her SOB is her main limiting factor with exercise and activity.    Expected Outcomes Short: obtain CPAP machine and start using it to aid in sleep quality. Long: maintain good sleep patterns long term.    Interventions Encouraged to attend Pulmonary Rehabilitation for the exercise    Continue Psychosocial Services  Follow up required by staff             Education: Education Goals: Education classes will be provided on a weekly basis, covering required topics. Participant will state understanding/return demonstration of topics presented.  Learning Barriers/Preferences:  Learning Barriers/Preferences - 04/01/23 0946       Learning Barriers/Preferences   Learning Barriers None    Learning Preferences None             General Pulmonary Education Topics:  Infection Prevention: - Provides verbal and written material to individual with discussion of infection control including proper hand washing and proper equipment cleaning during exercise session. Flowsheet Row Pulmonary Rehab from 05/08/2023 in [facility] Cardiac and Pulmonary Rehab  Date 04/01/23  Educator [facility]  Instruction Review Code 1- Verbalizes Understanding  Falls Prevention: - Provides verbal and written material to individual with discussion of falls prevention and safety. Flowsheet Row Pulmonary Rehab from 05/08/2023 in [facility] Cardiac and Pulmonary Rehab  Date 04/01/23  Educator [facility]  Instruction Review Code 1- Verbalizes Understanding       Chronic Lung Disease Review: - Group verbal instruction with posters, models, PowerPoint presentations and videos,  to review new updates, new respiratory  medications, new advancements in procedures and treatments. Providing information on websites and "800" numbers for continued self-education. Includes information about supplement oxygen, available portable oxygen systems, continuous and intermittent flow rates, oxygen safety, concentrators, and Medicare reimbursement for oxygen. Explanation of Pulmonary Drugs, including class, frequency, complications, importance of spacers, rinsing mouth after steroid MDI's, and proper cleaning methods for nebulizers. Review of basic lung anatomy and physiology related to function, structure, and complications of lung disease. Review of risk factors. Discussion about methods for diagnosing sleep apnea and types of masks and machines for OSA. Includes a review of the use of types of environmental controls: home humidity, furnaces, filters, dust mite/pet prevention, HEPA vacuums. Discussion about weather changes, air quality and the benefits of nasal washing. Instruction on Warning signs, infection symptoms, calling MD promptly, preventive modes, and value of vaccinations. Review of effective airway clearance, coughing and/or vibration techniques. Emphasizing that all should Create an Action Plan. Written material given at graduation.   AED/CPR: - Group verbal and written instruction with the use of models to demonstrate the basic use of the AED with the basic ABC's of resuscitation.    Anatomy and Cardiac Procedures: - Group verbal and visual presentation and models provide information about basic cardiac anatomy and function. Reviews the testing methods done to diagnose heart disease and the outcomes of the test results. Describes the treatment choices: Medical Management, Angioplasty, or Coronary Bypass Surgery for treating various heart conditions including Myocardial Infarction, Angina, Valve Disease, and Cardiac Arrhythmias.  Written material given at graduation.   Medication Safety: - Group verbal and visual  instruction to review commonly prescribed medications for heart and lung disease. Reviews the medication, class of the drug, and side effects. Includes the steps to properly store meds and maintain the prescription regimen.  Written material given at graduation.   Other: -Provides group and verbal instruction on various topics (see comments)   Knowledge Questionnaire Score:    Core Components/Risk Factors/Patient Goals at Admission:  Personal Goals and Risk Factors at Admission - 04/01/23 0946       Core Components/Risk Factors/Patient Goals on Admission    Weight Management Yes    Intervention Weight Management: Develop a combined nutrition and exercise program designed to reach desired caloric intake, while maintaining appropriate intake of nutrient and fiber, sodium and fats, and appropriate energy expenditure required for the weight goal.;Weight Management: Provide education and appropriate resources to help participant work on and attain dietary goals.;Weight Management/Obesity: Establish reasonable short term and long term weight goals.    Expected Outcomes Long Term: Adherence to nutrition and physical activity/exercise program aimed toward attainment of established weight goal;Short Term: Continue to assess and modify interventions until short term weight is achieved;Weight Loss: Understanding of general recommendations for a balanced deficit meal plan, which promotes 1-2 lb weight loss per week and includes a negative energy balance of [phone] kcal/d;Understanding recommendations for meals to include 15-35% energy as protein, 25-35% energy from fat, 35-60% energy from carbohydrates, less than 200mg  of dietary cholesterol, 20-35 gm of total fiber daily;Understanding of distribution of calorie intake throughout  the day with the consumption of 4-5 meals/snacks    Improve shortness of breath with ADL's Yes    Intervention Provide education, individualized exercise plan and daily activity  instruction to help decrease symptoms of SOB with activities of daily living.    Expected Outcomes Short Term: Improve cardiorespiratory fitness to achieve a reduction of symptoms when performing ADLs;Long Term: Be able to perform more ADLs without symptoms or delay the onset of symptoms    Diabetes Yes    Intervention Provide education about signs/symptoms and action to take for hypo/hyperglycemia.;Provide education about proper nutrition, including hydration, and aerobic/resistive exercise prescription along with prescribed medications to achieve blood glucose in normal ranges: Fasting glucose 65-99 mg/dL    Expected Outcomes Short Term: Participant verbalizes understanding of the signs/symptoms and immediate care of hyper/hypoglycemia, proper foot care and importance of medication, aerobic/resistive exercise and nutrition plan for blood glucose control.;Long Term: Attainment of HbA1C < 7%.    Hypertension Yes    Intervention Provide education on lifestyle modifcations including regular physical activity/exercise, weight management, moderate sodium restriction and increased consumption of fresh fruit, vegetables, and low fat dairy, alcohol moderation, and smoking cessation.;Monitor prescription use compliance.    Expected Outcomes Short Term: Continued assessment and intervention until BP is < 140/63mm HG in hypertensive participants. < 130/6mm HG in hypertensive participants with diabetes, heart failure or chronic kidney disease.;Long Term: Maintenance of blood pressure at goal levels.    Lipids Yes    Intervention Provide education and support for participant on nutrition & aerobic/resistive exercise along with prescribed medications to achieve LDL 70mg , HDL >40mg .    Expected Outcomes Short Term: Participant states understanding of desired cholesterol values and is compliant with medications prescribed. Participant is following exercise prescription and nutrition guidelines.;Long Term: Cholesterol  controlled with medications as prescribed, with individualized exercise RX and with personalized nutrition plan. Value goals: LDL < 70mg , HDL > 40 mg.             Education:Diabetes - Individual verbal and written instruction to review signs/symptoms of diabetes, desired ranges of glucose level fasting, after meals and with exercise. Acknowledge that pre and post exercise glucose checks will be done for 3 sessions at entry of program. Flowsheet Row Pulmonary Rehab from 05/08/2023 in [facility] Cardiac and Pulmonary Rehab  Date 04/01/23  Educator [facility]  Instruction Review Code 1- Verbalizes Understanding       Know Your Numbers and Heart Failure: - Group verbal and visual instruction to discuss disease risk factors for cardiac and pulmonary disease and treatment options.  Reviews associated critical values for Overweight/Obesity, Hypertension, Cholesterol, and Diabetes.  Discusses basics of heart failure: signs/symptoms and treatments.  Introduces Heart Failure Zone chart for action plan for heart failure.  Written material given at graduation.   Core Components/Risk Factors/Patient Goals Review:   Goals and Risk Factor Review     Row Name 04/29/23 [phone]             Core Components/Risk Factors/Patient Goals Review   Personal Goals Review Diabetes;Hypertension       Review Patient reported that her doctor had prescribed medication to help control blood sugars but she has not been taking it. She has been trying to manage her blood sugars with nutrion. She has not told her doctor this and she was encouraged to call her doctor inform them that she has not been taking her DM meds and to discuss options with her care plan. She does have a blood pressure cuff  at home but does not consistently use it. She was encouraged to start checking her blood pressure at home. She does take her BP meds.       Expected Outcomes Short: call doctor to discuss diabetes medicaiton concerns. Start checking BP at home.  Long: work with medical team to control cardiac risk factors                Core Components/Risk Factors/Patient Goals at Discharge (Final Review):   Goals and Risk Factor Review - 04/29/23 0942       Core Components/Risk Factors/Patient Goals Review   Personal Goals Review Diabetes;Hypertension    Review Patient reported that her doctor had prescribed medication to help control blood sugars but she has not been taking it. She has been trying to manage her blood sugars with nutrion. She has not told her doctor this and she was encouraged to call her doctor inform them that she has not been taking her DM meds and to discuss options with her care plan. She does have a blood pressure cuff at home but does not consistently use it. She was encouraged to start checking her blood pressure at home. She does take her BP meds.    Expected Outcomes Short: call doctor to discuss diabetes medicaiton concerns. Start checking BP at home. Long: work with medical team to control cardiac risk factors             ITP Comments:  ITP Comments     Row Name 04/01/23 0945 04/02/23 1426 04/08/23 0936 04/10/23 0937 05/08/23 1245   ITP Comments Virtual Visit completed. Patient informed on EP and RD appointment and 6 Minute walk test. Patient also informed of patient health questionnaires on My Chart. Patient Verbalizes understanding. Visit diagnosis can be found in [facility] 01/13/2023. Completed and gym orientation. Initial ITP created and sent for review to Dr. Faud Aleskerov, Medical Director. First full day of exercise!  Patient was oriented to gym and equipment including functions, settings, policies, and procedures.  Patient's individual exercise prescription and treatment plan were reviewed.  All starting workloads were established based on the results of the 6 minute walk test done at initial orientation visit.  The plan for exercise progression was also introduced and progression will be customized based on  patient's performance and goals. 30 Day review completed. Medical Director ITP review done, changes made as directed, and signed approval by Medical Director.    new to program 30 Day review completed. Medical Director ITP review done, changes made as directed, and signed approval by Medical Director.            Comments:

## 2023-05-08 NOTE — Progress Notes (Signed)
 Daily Session Note  Patient Details  Name: Jenna Tyler MRN: [id number] Date of Birth: June 12, 1965 Referring Provider:   Flowsheet Row Pulmonary Rehab from 04/02/2023 in [facility] Cardiac and Pulmonary Rehab  Referring Provider Dr. Elgin Grit       Encounter Date: 05/08/2023  Check In:  Session Check In - 05/08/23 0942       Check-In   Supervising physician immediately available to respond to emergencies See telemetry face sheet for immediately available ER MD    Location [facility]-Cardiac & Pulmonary Rehab    Staff Present Maud Sorenson, RN, BSN, CCRP;Meredith Manson Seitz RN,BSN;Joseph Hood RCP,RRT,BSRT;Maxon [place] BS, Exercise Physiologist    Virtual Visit No    Medication changes reported     No    Fall or balance concerns reported    No    Warm-up and Cool-down Performed on first and last piece of equipment    Resistance Training Performed Yes    VAD Patient? No    PAD/SET Patient? No      Pain Assessment   Currently in Pain? No/denies                Social History   Tobacco Use  Smoking Status Former   Current packs/day: 0.00   Average packs/day: 1 pack/day for 15.0 years (15.0 ttl pk-yrs)   Types: Cigarettes   Start date: 12/29/1982   Quit date: 12/28/1997   Years since quitting: 25.3  Smokeless Tobacco Never    Goals Met:  Proper associated with RPD/PD & O2 Sat Independence with exercise equipment Exercise tolerated well No report of concerns or symptoms today  Goals Unmet:  Not Applicable  Comments: Pt able to follow exercise prescription today without complaint.  Will continue to monitor for progression.    Dr. Firman Hughes is Medical Director for [facility] Cardiac Rehabilitation.  Dr. Fuad Aleskerov is Medical Director for [facility] Pulmonary Rehabilitation.

## 2023-05-13 ENCOUNTER — Encounter: Payer: Medicaid Other | Admitting: *Deleted

## 2023-05-13 DIAGNOSIS — J449 Chronic obstructive pulmonary disease, unspecified: Secondary | ICD-10-CM

## 2023-05-13 NOTE — Progress Notes (Signed)
Assessment start time: 10:20 AM  Digestive issues/concerns: no known food allergies   24-hours Recall: B: nothing L: double cheeseburger and fries, mt dew D: chili beans, mt dew Snack: mini blizzard from [employer]  Beverages mt dew, no water Caffeine mt dew  Education r/t nutrition plan She doesn't drink any water, instead drinks mt dew. 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 Commended her on this accomplishment. Spoke with her about her A1C of 6.2%. Educated on carbs counting, target of 30-60g per meal. Went over different carb choices and how to improve the quality of carb choices. Encouraged more fruits and whole grains. Went over several small meal and snack ideas to help her be more consistent with her complex carb intake. Set goal to work on eating smaller breakfast with protein and complex carbs.     Goal 1: Drink less Mt dew and at least 1 bottle of water per day Goal 2: Eat a smaller breakfast with 15-30gProtein and 30-60gCarbs at each meal. Goal 3: Include more colorful produce, aim for 5-8 servings of fruits and veggies per day  End time 10:46 AM

## 2023-05-13 NOTE — Progress Notes (Signed)
Daily Session Note  Patient Details  Name: Versia Delaguila MRN: [id number] Date of Birth: 06-07-1965 Referring Provider:   Flowsheet Row Pulmonary Rehab from 04/02/2023 in [facility] Cardiac and Pulmonary Rehab  Referring Provider Dr. Alphonzo Dublin       Encounter Date: 05/13/2023  Check In:  Session Check In - 05/13/23 0920       Check-In   Supervising physician immediately available to respond to emergencies See telemetry face sheet for immediately available ER MD    Location [facility]-Cardiac & Pulmonary Rehab    Staff Present Maxon Conetta BS, Exercise Physiologist;Kelly Cloretta Ned, ACSM CEP, Exercise Physiologist;Margaret Best, MS, Exercise Physiologist;Gerlene Glassburn Jewel Baize RN,BSN    Virtual Visit No    Medication changes reported     No    Fall or balance concerns reported    No    Warm-up and Cool-down Performed on first and last piece of equipment    Resistance Training Performed Yes    VAD Patient? No    PAD/SET Patient? No      Pain Assessment   Currently in Pain? No/denies                Social History   Tobacco Use  Smoking Status Former   Current packs/day: 0.00   Average packs/day: 1 pack/day for 15.0 years (15.0 ttl pk-yrs)   Types: Cigarettes   Start date: 12/29/1982   Quit date: 12/28/1997   Years since quitting: 25.3  Smokeless Tobacco Never    Goals Met:  Independence with exercise equipment Exercise tolerated well No report of concerns or symptoms today Strength training completed today  Goals Unmet:  Not Applicable  Comments: Pt able to follow exercise prescription today without complaint.  Will continue to monitor for progression.    Dr. Bethann Punches is Medical Director for [facility] Cardiac Rehabilitation.  Dr. Vida Rigger is Medical Director for [facility] Pulmonary Rehabilitation.

## 2023-05-17 ENCOUNTER — Encounter: Payer: Medicaid Other | Admitting: *Deleted

## 2023-05-17 DIAGNOSIS — J449 Chronic obstructive pulmonary disease, unspecified: Secondary | ICD-10-CM | POA: Diagnosis not present

## 2023-05-17 NOTE — Progress Notes (Signed)
Daily Session Note  Patient Details  Name: Latina Frank MRN: [id number] Date of Birth: [birth year] Referring Provider:   Flowsheet Row Pulmonary Rehab from 04/02/2023 in [facility] Cardiac and Pulmonary Rehab  Referring Provider Dr. Alphonzo Dublin       Encounter Date: 05/17/2023  Check In:  Session Check In - 05/17/23 0928       Check-In   Supervising physician immediately available to respond to emergencies See telemetry face sheet for immediately available ER MD    Location [facility]-Cardiac & Pulmonary Rehab    Staff Present Cora Collum, RN, BSN, CCRP;Joseph Hood RCP,RRT,BSRT;Noah Tickle, [place], Exercise Physiologist    Virtual Visit No    Medication changes reported     No    Fall or balance concerns reported    No    Warm-up and Cool-down Performed on first and last piece of equipment    Resistance Training Performed Yes    VAD Patient? No    PAD/SET Patient? No      Pain Assessment   Currently in Pain? No/denies                Social History   Tobacco Use  Smoking Status Former   Current packs/day: 0.00   Average packs/day: 1 pack/day for 15.0 years (15.0 ttl pk-yrs)   Types: Cigarettes   Start date: 12/29/1982   Quit date: 12/28/1997   Years since quitting: 25.4  Smokeless Tobacco Never    Goals Met:  Proper associated with RPD/PD & O2 Sat Independence with exercise equipment Exercise tolerated well No report of concerns or symptoms today  Goals Unmet:  Not Applicable  Comments: Pt able to follow exercise prescription today without complaint.  Will continue to monitor for progression.    Dr. Bethann Punches is Medical Director for [facility] Cardiac Rehabilitation.  Dr. Vida Rigger is Medical Director for [facility] Pulmonary Rehabilitation.

## 2023-05-20 ENCOUNTER — Encounter: Payer: Medicaid Other | Admitting: *Deleted

## 2023-05-20 DIAGNOSIS — J449 Chronic obstructive pulmonary disease, unspecified: Secondary | ICD-10-CM

## 2023-05-20 NOTE — Progress Notes (Signed)
Daily Session Note  Patient Details  Name: Zuleyka Kloc MRN: [id number] Date of Birth: 03-Apr-1966 Referring Provider:   Flowsheet Row Pulmonary Rehab from 04/02/2023 in [facility] Cardiac and Pulmonary Rehab  Referring Provider Dr. Alphonzo Dublin       Encounter Date: 05/20/2023  Check In:  Session Check In - 05/20/23 0937       Check-In   Supervising physician immediately available to respond to emergencies See telemetry face sheet for immediately available ER MD    Location [facility]-Cardiac & Pulmonary Rehab    Staff Present Rory Percy, MS, Exercise Physiologist;Corderro Koloski, RN, BSN, CCRP;Maxon Conetta BS, Exercise Physiologist;Kelly [employer], ACSM CEP, Exercise Physiologist    Virtual Visit No    Medication changes reported     No    Fall or balance concerns reported    No    Warm-up and Cool-down Performed on first and last piece of equipment    Resistance Training Performed Yes    VAD Patient? No    PAD/SET Patient? No      Pain Assessment   Currently in Pain? No/denies                Social History   Tobacco Use  Smoking Status Former   Current packs/day: 0.00   Average packs/day: 1 pack/day for 15.0 years (15.0 ttl pk-yrs)   Types: Cigarettes   Start date: 12/29/1982   Quit date: 12/28/1997   Years since quitting: 25.4  Smokeless Tobacco Never    Goals Met:  Proper associated with RPD/PD & O2 Sat Independence with exercise equipment Exercise tolerated well No report of concerns or symptoms today  Goals Unmet:  Not Applicable  Comments: Pt able to follow exercise prescription today without complaint.  Will continue to monitor for progression.    Dr. Bethann Punches is Medical Director for [facility] Cardiac Rehabilitation.  Dr. Vida Rigger is Medical Director for [facility] Pulmonary Rehabilitation.

## 2023-05-22 ENCOUNTER — Emergency Department: Payer: Medicaid Other

## 2023-05-22 ENCOUNTER — Emergency Department
Admission: EM | Admit: 2023-05-22 | Discharge: 2023-05-22 | Disposition: A | Discharge status: Home / Self Care | Payer: Medicaid Other | Attending: Emergency Medicine | Admitting: Emergency Medicine

## 2023-05-22 ENCOUNTER — Other Ambulatory Visit: Payer: Self-pay

## 2023-05-22 DIAGNOSIS — R1012 Left upper quadrant pain: Secondary | ICD-10-CM | POA: Insufficient documentation

## 2023-05-22 DIAGNOSIS — J449 Chronic obstructive pulmonary disease, unspecified: Secondary | ICD-10-CM | POA: Insufficient documentation

## 2023-05-22 DIAGNOSIS — K769 Liver disease, unspecified: Secondary | ICD-10-CM | POA: Insufficient documentation

## 2023-05-22 DIAGNOSIS — I10 Essential (primary) hypertension: Secondary | ICD-10-CM | POA: Diagnosis not present

## 2023-05-22 DIAGNOSIS — R1013 Epigastric pain: Secondary | ICD-10-CM

## 2023-05-22 DIAGNOSIS — E119 Type 2 diabetes mellitus without complications: Secondary | ICD-10-CM | POA: Diagnosis not present

## 2023-05-22 LAB — COMPREHENSIVE METABOLIC PANEL
ALT: 22 U/L (ref 0–44)
AST: 22 U/L (ref 15–41)
Albumin: 4.4 g/dL (ref 3.5–5.0)
Alkaline Phosphatase: 98 U/L (ref 38–126)
Anion gap: 11 (ref 5–15)
BUN: 19 mg/dL (ref 6–20)
CO2: 23 mmol/L (ref 22–32)
Calcium: 9.4 mg/dL (ref 8.9–10.3)
Chloride: 105 mmol/L (ref 98–111)
Creatinine, Ser: 0.82 mg/dL (ref 0.44–1.00)
GFR, Estimated: >60 mL/min (ref >60)
Glucose, Bld: 115 mg/dL — ABNORMAL HIGH (ref 70–99)
Potassium: 4.4 mmol/L (ref 3.5–5.1)
Sodium: 139 mmol/L (ref 135–145)
Total Bilirubin: 0.8 mg/dL (ref 0.0–1.2)
Total Protein: 7.7 g/dL (ref 6.5–8.1)

## 2023-05-22 LAB — URINALYSIS, ROUTINE W REFLEX MICROSCOPIC
Bacteria, UA: NONE SEEN
Glucose, UA: NEGATIVE mg/dL
Hgb urine dipstick: NEGATIVE
Ketones, ur: NEGATIVE mg/dL
Nitrite: NEGATIVE
Protein, ur: NEGATIVE mg/dL
Specific Gravity, Urine: 1.029 (ref 1.005–1.030)
pH: 5 (ref 5.0–8.0)

## 2023-05-22 LAB — CBC
HCT: 49.6 % — ABNORMAL HIGH (ref 36.0–46.0)
Hemoglobin: 16.4 g/dL — ABNORMAL HIGH (ref 12.0–15.0)
MCH: 28.3 pg (ref 26.0–34.0)
MCHC: 33.1 g/dL (ref 30.0–36.0)
MCV: 85.7 fL (ref 80.0–100.0)
Platelets: 350 10*3/uL (ref 150–400)
RBC: 5.79 MIL/uL — ABNORMAL HIGH (ref 3.87–5.11)
RDW: 13.7 % (ref 11.5–15.5)
WBC: 15.5 10*3/uL — ABNORMAL HIGH (ref 4.0–10.5)
nRBC: 0 % (ref 0.0–0.2)

## 2023-05-22 LAB — TROPONIN I (HIGH SENSITIVITY): Troponin I (High Sensitivity): <2 ng/L (ref <18)

## 2023-05-22 LAB — LIPASE, BLOOD: Lipase: 24 U/L (ref 11–51)

## 2023-05-22 MED ORDER — ALUM & MAG HYDROXIDE-SIMETH 200-200-20 MG/5ML PO SUSP
30.0000 mL | Freq: Once | ORAL | Status: AC
  Administered 2023-05-22: 30 mL via ORAL
  Filled 2023-05-22: qty 30

## 2023-05-22 MED ORDER — IOHEXOL 300 MG/ML  SOLN
100.0000 mL | Freq: Once | INTRAMUSCULAR | Status: AC | PRN
Start: 2023-05-22 — End: 2023-05-22
  Administered 2023-05-22: 100 mL via INTRAVENOUS

## 2023-05-22 MED ORDER — MAALOX 600 MG PO CHEW: 600.0000 mg | CHEWABLE_TABLET | Freq: Three times a day (TID) | ORAL | 0 refills | Status: AC | PRN

## 2023-05-22 MED ORDER — MORPHINE SULFATE (PF) 2 MG/ML IV SOLN
2.0000 mg | Freq: Once | INTRAVENOUS | Status: AC
  Administered 2023-05-22: 2 mg via INTRAVENOUS
  Filled 2023-05-22: qty 1

## 2023-05-22 MED ORDER — LIDOCAINE VISCOUS HCL 2 % MT SOLN
15.0000 mL | Freq: Once | OROMUCOSAL | Status: AC
  Administered 2023-05-22: 15 mL via ORAL
  Filled 2023-05-22: qty 15

## 2023-05-22 NOTE — ED Notes (Signed)
Pt gone to CT

## 2023-05-22 NOTE — ED Notes (Signed)
MD at bedside.

## 2023-05-22 NOTE — ED Notes (Addendum)
Pt verbalizes understanding of discharge instructions. Opportunity for questioning and answers were provided. Pt discharged from ED to home in cab through medicare that this RN arranged.

## 2023-05-22 NOTE — ED Provider Notes (Signed)
[facility] Provider Note    Event Date/Time   First MD Initiated Contact with Patient 05/22/23 [phone]     (approximate)   History   Abdominal Pain   HPI  Jenna Tyler is a 58 y.o. female with history of COPD, diabetes, neurofibromatosis, fibromyalgia, hypertension, hyperlipidemia presenting with left upper quadrant and epigastric abdominal pain.  States this has been ongoing for a long time but has gotten worse in the last couple days.  States that it is not burning, feels sharp, nonradiating.  Also has some nausea.  She denies any diarrhea, vomiting, chest pain, shortness of breath, fever, cough.  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  No urinary symptoms.  On independent chart review, she was seen by primary care doctor in October of last year, has history of tracheobronchomalacia, patient states no changes to her breathing at this time.  Had a CT scan done in November that showed history of cholecystectomy and prior hysterectomy without acute process.     Physical Exam   Triage Vital Signs: ED Triage Vitals  Encounter Vitals Group     BP 05/22/23 1000 135/88     Systolic BP Percentile --      Diastolic BP Percentile --      Pulse Rate 05/22/23 1000 98     Resp 05/22/23 1000 16     Temp 05/22/23 1004 97.8 F (36.6 C)     Temp Source 05/22/23 1000 Oral     SpO2 05/22/23 1000 100 %     Weight 05/22/23 1001 187 lb 2.7 oz (84.9 kg)     Height --      Head Circumference --      Peak Flow --      Pain Score 05/22/23 1001 5     Pain Loc --      Pain Education --      Exclude from Growth Chart --     Most recent vital signs: Vitals:   05/22/23 1004 05/22/23 1551  BP:  134/83  Pulse:  (!) 101  Resp:  18  Temp: 97.8 F (36.6 C) 97.7 F (36.5 C)  SpO2:  97%     General: Awake, no distress.  CV:  Good peripheral perfusion.  Resp:  Normal effort.  Abd:  No distention.  Soft, tender in the  left upper quadrant and epigastric region, no right upper quadrant or right lower quadrant abdominal pain. Other:  No CVA tenderness.   ED Results / Procedures / Treatments   Labs (all labs ordered are listed, but only abnormal results are displayed) Labs Reviewed  COMPREHENSIVE METABOLIC PANEL - Abnormal; Notable for the following components:      Result Value   Glucose, Bld 115 (*)    All other components within normal limits  CBC - Abnormal; Notable for the following components:   WBC 15.5 (*)    RBC 5.79 (*)    Hemoglobin 16.4 (*)    HCT 49.6 (*)    All other components within normal limits  URINALYSIS, ROUTINE W REFLEX MICROSCOPIC - Abnormal; Notable for the following components:   Color, Urine YELLOW (*)    APPearance HAZY (*)    Bilirubin Urine MODERATE (*)    Leukocytes,Ua MODERATE (*)    All other components within normal limits  LIPASE, BLOOD  TROPONIN I (HIGH SENSITIVITY)  TROPONIN I (HIGH SENSITIVITY)     EKG  EKG showed normal sinus rhythm, rate 78, normal QS, normal QTc, T wave flattening in 1, aVL, V3, no ischemic ST elevation or depressions, not significant change compared to prior   RADIOLOGY Chest x-ray on my interpretation without focal consolidation   PROCEDURES:  Critical Care performed: No  Procedures   MEDICATIONS ORDERED IN ED: Medications  alum & mag hydroxide-simeth (MAALOX/MYLANTA) 200-200-20 MG/5ML suspension 30 mL (30 mLs Oral Given 05/22/23 1701)    And  lidocaine (XYLOCAINE) 2 % viscous mouth solution 15 mL (15 mLs Oral Given 05/22/23 1702)  iohexol (OMNIPAQUE) 300 MG/ML solution 100 mL (100 mLs Intravenous Contrast Given 05/22/23 1642)  morphine (PF) 2 MG/ML injection 2 mg (2 mg Intravenous Given 05/22/23 1754)     IMPRESSION / MDM / ASSESSMENT AND PLAN / ED COURSE  I reviewed the triage vital signs and the nursing notes.                              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  Will give her a GI cocktail. labs and chest x-ray were obtained in the waiting room, will add on a troponin as well as a EKG and a CT.  Reassess  Patient's presentation is most consistent with acute presentation with potential threat to life or bodily function.  On reassessment patient still having some pain, will give her small dose of IV morphine after the GI cocktail.  CT is still pending.  Independent review of labs and imaging, she has a mild leukocytosis, UA not consistent with UTI, no electrolyte derangements, LFTs are normal, creatinine is normal, troponin is less than 2, EKG is nonspecific.  CT imaging without acute pathology, noted liver lesion that could be a hemangioma.  Discussed incidental findings with patient.  Suspect this might be gastritis or GERD or peptic ulcer disease given negative CT findings. patient is already on omeprazole.  Considered but no indication for additional workup inpatient mission at this time, she is safe for outpatient management.  Instructed her to follow-up with a primary care doctor, might need for referral to GI for an EGD.  Will give her prescription for Maalox.  Otherwise strict return precautions given.  Will discharge.      FINAL CLINICAL IMPRESSION(S) / ED DIAGNOSES   Final diagnoses:  Left upper quadrant abdominal pain  Epigastric pain  Liver lesion     Rx / DC Orders   ED Discharge Orders          Ordered    Calcium Carbonate Antacid (MAALOX) 600 MG chewable tablet  3 times daily PRN        05/22/23 1829             Note:  This document was prepared using Dragon voice recognition software and may include unintentional dictation errors.    Claybon Jabs, MD 05/22/23 [phone]

## 2023-05-22 NOTE — ED Notes (Signed)
Called for ride for pt. ETA 1900.

## 2023-05-22 NOTE — ED Provider Triage Note (Signed)
Emergency Medicine Provider Triage Evaluation Note  Jenna Tyler , a 58 y.o. female  was evaluated in triage.  Pt complains of bulging under the left rib, worse when she lays down.  Review of Systems  Positive:  Negative:   Physical Exam  BP 135/88 (BP Location: Left Arm)   Pulse 98   Resp 16   Wt 84.9 kg   SpO2 100%   BMI 30.39 kg/m  Gen:   Awake, no distress   Resp:  Normal effort  MSK:   Moves extremities without difficulty  Other:    Medical Decision Making  Medically screening exam initiated at 10:02 AM.  Appropriate orders placed.  Garlan Fair was informed that the remainder of the evaluation will be completed by another provider, this initial triage assessment does not replace that evaluation, and the importance of remaining in the ED until their evaluation is complete.  Questionable hernia versus mass   Faythe Ghee, PA-C 05/22/23 1003

## 2023-05-22 NOTE — ED Triage Notes (Signed)
Upper left quadrant pain. States pain has been ongoing for several years, worse over past several days.  States usually she can push are back in and it relieves pain.  Also c/o nausea.

## 2023-05-24 ENCOUNTER — Encounter: Payer: Medicaid Other | Admitting: *Deleted

## 2023-05-24 DIAGNOSIS — J449 Chronic obstructive pulmonary disease, unspecified: Secondary | ICD-10-CM | POA: Diagnosis not present

## 2023-05-24 NOTE — Progress Notes (Signed)
Daily Session Note  Patient Details  Name: Jenna Tyler MRN: [id number] Date of Birth: Sep 11, 1965 Referring Provider:   Flowsheet Row Pulmonary Rehab from 04/02/2023 in [facility] Cardiac and Pulmonary Rehab  Referring Provider Dr. Alphonzo Dublin       Encounter Date: 05/24/2023  Check In:  Session Check In - 05/24/23 0931       Check-In   Supervising physician immediately available to respond to emergencies See telemetry face sheet for immediately available ER MD    Location [facility]-Cardiac & Pulmonary Rehab    Staff Present Cora Collum, RN, BSN, CCRP;Noah Tickle, BS, Exercise Physiologist;Joseph Hood RCP,RRT,BSRT    Virtual Visit No    Medication changes reported     No    Fall or balance concerns reported    No    Warm-up and Cool-down Performed on first and last piece of equipment    Resistance Training Performed Yes    VAD Patient? No    PAD/SET Patient? No      Pain Assessment   Currently in Pain? No/denies                Social History   Tobacco Use  Smoking Status Former   Current packs/day: 0.00   Average packs/day: 1 pack/day for 15.0 years (15.0 ttl pk-yrs)   Types: Cigarettes   Start date: 12/29/1982   Quit date: 12/28/1997   Years since quitting: 25.4  Smokeless Tobacco Never    Goals Met:  Independence with exercise equipment Exercise tolerated well No report of concerns or symptoms today  Goals Unmet:  Not Applicable  Comments: Pt able to follow exercise prescription today without complaint.  Will continue to monitor for progression.    Dr. Bethann Punches is Medical Director for [place] Cardiac Rehabilitation.  Dr. Vida Rigger is Medical Director for [facility] Pulmonary Rehabilitation.

## 2023-05-27 ENCOUNTER — Encounter: Payer: Medicaid Other | Attending: Thoracic Surgery

## 2023-05-27 DIAGNOSIS — J449 Chronic obstructive pulmonary disease, unspecified: Secondary | ICD-10-CM | POA: Insufficient documentation

## 2023-05-27 NOTE — Progress Notes (Signed)
Daily Session Note  Patient Details  Name: Jenna Tyler MRN: [id number] Date of Birth: [birth year] Referring Provider:   Flowsheet Row Pulmonary Rehab from 04/02/2023 in [facility] Cardiac and Pulmonary Rehab  Referring Provider Dr. Alphonzo Dublin       Encounter Date: 05/27/2023  Check In:  Session Check In - 05/27/23 0911       Check-In   Supervising physician immediately available to respond to emergencies See telemetry face sheet for immediately available ER MD    Location [facility]-Cardiac & Pulmonary Rehab    Staff Present Kelton Pillar RN,BSN,MPA;Maxon Conetta BS, Exercise Physiologist;Margaret Best, MS, Exercise Physiologist;Eliott Amparan Cloretta Ned, ACSM CEP, Exercise Physiologist    Virtual Visit No    Medication changes reported     No    Fall or balance concerns reported    No    Tobacco Cessation No Change    Warm-up and Cool-down Performed on first and last piece of equipment    Resistance Training Performed Yes    VAD Patient? No    PAD/SET Patient? No      Pain Assessment   Currently in Pain? No/denies                Social History   Tobacco Use  Smoking Status Former   Current packs/day: 0.00   Average packs/day: 1 pack/day for 15.0 years (15.0 ttl pk-yrs)   Types: Cigarettes   Start date: 12/29/1982   Quit date: 12/28/1997   Years since quitting: 25.4  Smokeless Tobacco Never    Goals Met:  Independence with exercise equipment Exercise tolerated well No report of concerns or symptoms today Strength training completed today  Goals Unmet:  Not Applicable  Comments: Pt able to follow exercise prescription today without complaint.  Will continue to monitor for progression.   Reviewed home exercise with pt today from 9:38am to 9:50am.  Pt plans to walk and look into possibly join a gym for exercise.  Reviewed THR, pulse, RPE, sign and symptoms, pulse oximetery and when to call 911 or MD.  Also discussed weather considerations and indoor options.  Pt  voiced understanding.     Dr. Bethann Punches is Medical Director for [facility] Cardiac Rehabilitation.  Dr. Vida Rigger is Medical Director for [facility] Pulmonary Rehabilitation.

## 2023-05-28 ENCOUNTER — Other Ambulatory Visit: Payer: Self-pay | Admitting: Family Medicine

## 2023-05-28 DIAGNOSIS — R1013 Epigastric pain: Secondary | ICD-10-CM

## 2023-05-28 DIAGNOSIS — K769 Liver disease, unspecified: Secondary | ICD-10-CM

## 2023-05-29 ENCOUNTER — Encounter: Payer: Medicaid Other | Admitting: *Deleted

## 2023-05-29 DIAGNOSIS — J449 Chronic obstructive pulmonary disease, unspecified: Secondary | ICD-10-CM

## 2023-05-29 NOTE — Progress Notes (Signed)
 Daily Session Note  Patient Details  Name: Jenna Tyler MRN: [id number] Date of Birth: [birth year] Referring Provider:   Flowsheet Row Pulmonary Rehab from 04/02/2023 in [facility] Cardiac and Pulmonary Rehab  Referring Provider Dr. Donnice Moris       Encounter Date: 05/29/2023  Check In:  Session Check In - 05/29/23 0943       Check-In   Supervising physician immediately available to respond to emergencies See telemetry face sheet for immediately available ER MD    Location [facility]-Cardiac & Pulmonary Rehab    Staff Present Othel Durand, RN, BSN, CCRP;Maxon Conetta BS, Exercise Physiologist;Joseph Hood RCP,RRT,BSRT    Virtual Visit No    Medication changes reported     No    Fall or balance concerns reported    No    Warm-up and Cool-down Performed on first and last piece of equipment    Resistance Training Performed Yes    VAD Patient? No    PAD/SET Patient? No      Pain Assessment   Currently in Pain? No/denies                Social History   Tobacco Use  Smoking Status Former   Current packs/day: 0.00   Average packs/day: 1 pack/day for 15.0 years (15.0 ttl pk-yrs)   Types: Cigarettes   Start date: 12/29/1982   Quit date: 12/28/1997   Years since quitting: 25.4  Smokeless Tobacco Never    Goals Met:  Proper associated with RPD/PD & O2 Sat Independence with exercise equipment Exercise tolerated well No report of concerns or symptoms today  Goals Unmet:  Not Applicable  Comments: Pt able to follow exercise prescription today without complaint.  Will continue to monitor for progression.    Dr. Oneil Pinal is Medical Director for [facility] Cardiac Rehabilitation.  Dr. Fuad Aleskerov is Medical Director for [facility] Pulmonary Rehabilitation.

## 2023-06-03 ENCOUNTER — Encounter: Payer: Medicaid Other | Admitting: *Deleted

## 2023-06-03 ENCOUNTER — Ambulatory Visit
Admission: RE | Admit: 2023-06-03 | Discharge: 2023-06-03 | Disposition: A | Discharge status: Home / Self Care | Payer: Medicaid Other | Source: Ambulatory Visit | Attending: Family Medicine | Admitting: Family Medicine

## 2023-06-03 DIAGNOSIS — K769 Liver disease, unspecified: Secondary | ICD-10-CM | POA: Diagnosis present

## 2023-06-03 DIAGNOSIS — R1013 Epigastric pain: Secondary | ICD-10-CM | POA: Diagnosis present

## 2023-06-03 MED ORDER — GADOBUTROL 1 MMOL/ML IV SOLN
7.5000 mL | Freq: Once | INTRAVENOUS | Status: AC | PRN
  Administered 2023-06-03: 7.5 mL via INTRAVENOUS

## 2023-06-03 NOTE — Progress Notes (Signed)
 Incomplete Session Note  Patient Details  Name: Jenna Tyler MRN: [id number] Date of Birth: 1966/03/02 Referring Provider:   Flowsheet Row Pulmonary Rehab from 04/02/2023 in [facility] Cardiac and Pulmonary Rehab  Referring Provider Dr. Elgin Grit       Jenna Tyler did not complete her rehab session.  She had an MRI with sedating meds. Sent her home to rest.

## 2023-06-05 ENCOUNTER — Encounter: Payer: Medicaid Other | Admitting: *Deleted

## 2023-06-05 ENCOUNTER — Encounter: Payer: Self-pay | Admitting: *Deleted

## 2023-06-05 DIAGNOSIS — J449 Chronic obstructive pulmonary disease, unspecified: Secondary | ICD-10-CM

## 2023-06-05 NOTE — Progress Notes (Signed)
Pulmonary Individual Treatment Plan  Patient Details  Name: Jenna Tyler MRN: [id number] Date of Birth: 06/18/1965 Referring Provider:   Flowsheet Row Pulmonary Rehab from 04/02/2023 in [facility] Cardiac and Pulmonary Rehab  Referring Provider Dr. Alphonzo Dublin       Initial Encounter Date:  Flowsheet Row Pulmonary Rehab from 04/02/2023 in [facility] Cardiac and Pulmonary Rehab  Date 04/02/23       Visit Diagnosis: Chronic obstructive pulmonary disease, unspecified COPD type (HCC)  Patient's Home Medications on Admission:  Current Outpatient Medications:    albuterol (PROVENTIL) (2.5 MG/3ML) 0.083% nebulizer solution, Inhale into the lungs., Disp: , Rfl:    albuterol (VENTOLIN HFA) 108 (90 Base) MCG/ACT inhaler, Inhale 1-2 puffs into the lungs every 4 (four) hours as needed. (Patient not taking: Reported on 04/01/2023), Disp: , Rfl:    Blood Glucose Monitoring Suppl (GLUCOCOM BLOOD GLUCOSE MONITOR) DEVI, 1 each as directed, Disp: , Rfl:    budesonide (PULMICORT) 0.25 MG/2ML nebulizer solution, Inhale into the lungs., Disp: , Rfl:    butalbital-acetaminophen-caffeine (FIORICET) 50-325-40 MG tablet, Take 1 tablet by mouth every 4 (four) hours as needed for headache., Disp: 14 tablet, Rfl: 0   Calcium Carbonate Antacid (MAALOX) 600 MG chewable tablet, Chew 1 tablet (600 mg total) by mouth 3 (three) times daily as needed for up to 14 days for heartburn., Disp: 42 tablet, Rfl: 0   chlorpheniramine-HYDROcodone (TUSSIONEX) 10-8 MG/5ML, Take 5 mLs by mouth every 12 (twelve) hours., Disp: 70 mL, Rfl: 0   Cholecalciferol 50 MCG (2000 UT) TABS, Take by mouth. (Patient not taking: Reported on 04/01/2023), Disp: , Rfl:    Cysteamine Bitartrate (PROCYSBI) 300 MG PACK, Use 1 each once daily Use as instructed., Disp: , Rfl:    dicyclomine (BENTYL) 20 MG tablet, Take 1 tablet (20 mg total) by mouth every 8 (eight) hours as needed., Disp: 15 tablet, Rfl: 0   doxycycline (VIBRAMYCIN) 100 MG capsule,  SMARTSIG:1.0 Capsule(s) By Mouth Twice Daily, Disp: , Rfl:    gabapentin (NEURONTIN) 800 MG tablet, Take 800 mg by mouth 2 (two) times daily., Disp: , Rfl:    glucose blood (ACCU-CHEK GUIDE TEST) test strip, 1 each (1 strip total) once daily Use as instructed., Disp: , Rfl:    HYDROcodone-acetaminophen (NORCO/VICODIN) 5-325 MG tablet, Take 1 tablet by mouth every 4 (four) hours as needed., Disp: 15 tablet, Rfl: 0   hydrOXYzine (ATARAX) 25 MG tablet, Take by mouth. (Patient not taking: Reported on 04/01/2023), Disp: , Rfl:    ibuprofen (ADVIL) 200 MG tablet, Take by mouth., Disp: , Rfl:    levothyroxine (SYNTHROID) 100 MCG tablet, Take 100 mcg by mouth daily., Disp: , Rfl:    meclizine (ANTIVERT) 25 MG tablet, Take 25mg  1-3 times daily as needed (Patient not taking: Reported on 04/01/2023), Disp: , Rfl:    metFORMIN (GLUCOPHAGE) 500 MG tablet, Take 500 mg by mouth 2 (two) times daily. (Patient not taking: Reported on 04/01/2023), Disp: , Rfl:    methocarbamol (ROBAXIN) 750 MG tablet, Take 750 mg by mouth 2 (two) times daily. (Patient not taking: Reported on 04/01/2023), Disp: , Rfl:    metoprolol tartrate (LOPRESSOR) 25 MG tablet, Take 25 mg by mouth every morning., Disp: , Rfl:    Multiple Vitamin (MULTIVITAMIN WITH MINERALS) TABS tablet, Take 1 tablet by mouth daily. (Patient not taking: Reported on 04/01/2023), Disp: , Rfl:    nortriptyline (PAMELOR) 10 MG capsule, , Disp: , Rfl:    omeprazole (PRILOSEC) 40 MG capsule, Take by  mouth., Disp: , Rfl:    ondansetron (ZOFRAN) 4 MG tablet, Take 8 mg by mouth 2 (two) times daily., Disp: , Rfl:    ondansetron (ZOFRAN-ODT) 4 MG disintegrating tablet, Take 1 tablet (4 mg total) by mouth every 6 (six) hours as needed for nausea or vomiting. (Patient not taking: Reported on 04/01/2023), Disp: 20 tablet, Rfl: 0   polyethylene glycol (MIRALAX) 17 g packet, Take 17 g by mouth daily. (Patient not taking: Reported on 04/01/2023), Disp: 30 each, Rfl: 0   rOPINIRole  (REQUIP) 0.5 MG tablet, Take 1 mg by mouth 2 (two) times daily., Disp: , Rfl:    SYMBICORT 160-4.5 MCG/ACT inhaler, Inhale 2 puffs into the lungs 2 (two) times daily., Disp: , Rfl:   Past Medical History: Past Medical History:  Diagnosis Date   AKI (acute kidney injury) (HCC) 07/12/2017   Asthma    COPD (chronic obstructive pulmonary disease) (HCC)    COPD exacerbation (HCC) 12/29/2016   COPD with acute exacerbation (HCC) 10/02/2022   DM (diabetes mellitus), type 2 (HCC)    Dyspnea    Fibromyalgia    Guillain Barr syndrome (HCC)    Headache    Hyperlipidemia    Hypertension    Hypothyroidism    Neurofibromatosis, peripheral, NF1 (HCC) 2017   Pancreatitis    PONV (postoperative nausea and vomiting)    Pulmonary nodule    RLS (restless legs syndrome)    Sleep apnea    never went to pick up her cpap   Tachycardia    Tracheomalacia     Tobacco Use: Social History   Tobacco Use  Smoking Status Former   Current packs/day: 0.00   Average packs/day: 1 pack/day for 15.0 years (15.0 ttl pk-yrs)   Types: Cigarettes   Start date: 12/29/1982   Quit date: 12/28/1997   Years since quitting: 25.4  Smokeless Tobacco Never    Labs: Review Flowsheet  More data may exist      Latest Ref Rng & Units 06/27/2015 12/29/2016 07/09/2019 07/10/2019 10/04/2021  Labs for ITP Cardiac and Pulmonary Rehab  Cholestrol 0 - 200 mg/dL - 213  - - -  LDL (calc) 0 - 99 mg/dL - 086  - - -  HDL-C >57 mg/dL - 47  - - -  Trlycerides <150 mg/dL - 846  - - -  Hemoglobin A1c 4.8 - 5.6 % 5.9  5.7  - 6.3  6.2   PH, Arterial 7.350 - 7.450 - - 7.39  - -  PCO2 arterial 32.0 - 48.0 mmHg - - 30  - -  Bicarbonate 20.0 - 28.0 mmol/L - - 18.2  - -  Acid-base deficit 0.0 - 2.0 mmol/L - - 5.6  - -  O2 Saturation % - - 97.7  - -     Pulmonary Assessment Scores:   UCSD: Self-administered rating of dyspnea associated with activities of daily living (ADLs) 6-point scale (0 = "not at all" to 5 = "maximal or unable to do  because of breathlessness")  Scoring Scores range from 0 to 120.  Minimally important difference is 5 units  CAT: CAT can identify the health impairment of COPD patients and is better correlated with disease progression.  CAT has a scoring range of zero to 40. The CAT score is classified into four groups of low (less than 10), medium (10 - 20), high (21-30) and very high (31-40) based on the impact level of disease on health status. A CAT score over 10 suggests significant  symptoms.  A worsening CAT score could be explained by an exacerbation, poor medication adherence, poor inhaler technique, or progression of COPD or comorbid conditions.  CAT MCID is 2 points  mMRC: mMRC (Modified Medical Research Council) Dyspnea Scale is used to assess the degree of baseline functional disability in patients of respiratory disease due to dyspnea. No minimal important difference is established. A decrease in score of 1 point or greater is considered a positive change.   Pulmonary Function Assessment:  Pulmonary Function Assessment - 04/01/23 0946       Breath   Shortness of Breath Yes;Limiting activity             Exercise Target Goals: Exercise Program Goal: Individual exercise prescription set using results from initial 6 min walk test and THRR while considering  patient's activity barriers and safety.   Exercise Prescription Goal: Initial exercise prescription builds to 30-45 minutes a day of aerobic activity, 2-3 days per week.  Home exercise guidelines will be given to patient during program as part of exercise prescription that the participant will acknowledge.  Education: Aerobic Exercise: - Group verbal and visual presentation on the components of exercise prescription. Introduces F.I.T.T principle from ACSM for exercise prescriptions.  Reviews F.I.T.T. principles of aerobic exercise including progression. Written material given at graduation.   Education: Resistance Exercise: - Group  verbal and visual presentation on the components of exercise prescription. Introduces F.I.T.T principle from ACSM for exercise prescriptions  Reviews F.I.T.T. principles of resistance exercise including progression. Written material given at graduation.    Education: Exercise & Equipment Safety: - Individual verbal instruction and demonstration of equipment use and safety with use of the equipment. Flowsheet Row Pulmonary Rehab from 05/29/2023 in [facility] Cardiac and Pulmonary Rehab  Date 04/01/23  Educator jh  Instruction Review Code 1- Verbalizes Understanding       Education: Exercise Physiology & General Exercise Guidelines: - Group verbal and written instruction with models to review the exercise physiology of the cardiovascular system and associated critical values. Provides general exercise guidelines with specific guidelines to those with heart or lung disease.    Education: Flexibility, Balance, Mind/Body Relaxation: - Group verbal and visual presentation with interactive activity on the components of exercise prescription. Introduces F.I.T.T principle from ACSM for exercise prescriptions. Reviews F.I.T.T. principles of flexibility and balance exercise training including progression. Also discusses the mind body connection.  Reviews various relaxation techniques to help reduce and manage stress (i.e. Deep breathing, progressive muscle relaxation, and visualization). Balance handout provided to take home. Written material given at graduation.   Activity Barriers & Risk Stratification:  Activity Barriers & Cardiac Risk Stratification - 04/02/23 1431       Activity Barriers & Cardiac Risk Stratification   Activity Barriers Fibromyalgia;Muscular Weakness;Shortness of Breath;Assistive Device   Cane            6 Minute Walk:  6 Minute Walk     Row Name 04/02/23 1427         6 Minute Walk   Phase Initial     Distance 1180 feet     Walk Time 6 minutes     # of Rest Breaks 0      MPH 2.2     METS 3.23     RPE 17     Perceived Dyspnea  3     VO2 Peak 11.3     Symptoms No     Resting HR 110 bpm     Resting BP 92/64  Resting Oxygen Saturation  95 %     Exercise Oxygen Saturation  during 6 min walk 89 %     Max Ex. HR 126 bpm     Max Ex. BP 106/64     2 Minute Post BP 90/64       Interval HR   1 Minute HR 116     2 Minute HR 126     3 Minute HR 125     4 Minute HR 121     5 Minute HR 125     6 Minute HR 126     2 Minute Post HR 118     Interval Heart Rate? Yes       Interval Oxygen   Interval Oxygen? Yes     Baseline Oxygen Saturation % 95 %     1 Minute Oxygen Saturation % 95 %     2 Minute Oxygen Saturation % 93 %     3 Minute Oxygen Saturation % 91 %     4 Minute Oxygen Saturation % 89 %     5 Minute Oxygen Saturation % 97 %     6 Minute Oxygen Saturation % 98 %     2 Minute Post Oxygen Saturation % 99 %             Oxygen Initial Assessment:  Oxygen Initial Assessment - 04/01/23 0946       Home Oxygen   Home Oxygen Device None    Sleep Oxygen Prescription None    Home Exercise Oxygen Prescription None    Home Resting Oxygen Prescription None      Initial 6 min Walk   Oxygen Used None      Program Oxygen Prescription   Program Oxygen Prescription None      Intervention   Short Term Goals To learn and exhibit compliance with exercise, home and travel O2 prescription;To learn and understand importance of monitoring SPO2 with pulse oximeter and demonstrate accurate use of the pulse oximeter.;To learn and understand importance of maintaining oxygen saturations>88%;To learn and demonstrate proper pursed lip breathing techniques or other breathing techniques. ;To learn and demonstrate proper use of respiratory medications    Long  Term Goals Exhibits compliance with exercise, home  and travel O2 prescription;Verbalizes importance of monitoring SPO2 with pulse oximeter and return demonstration;Maintenance of O2 saturations>88%;Exhibits  proper breathing techniques, such as pursed lip breathing or other method taught during program session;Compliance with respiratory medication;Demonstrates proper use of MDI's             Oxygen Re-Evaluation:  Oxygen Re-Evaluation     Row Name 04/08/23 2130 04/29/23 0959 05/29/23 0929         Program Oxygen Prescription   Program Oxygen Prescription -- None None       Home Oxygen   Home Oxygen Device -- None None     Sleep Oxygen Prescription -- CPAP  Patient has recently been diagnosed with sleep apnea and is waiting for her CPAP machine to arrive. CPAP     Home Exercise Oxygen Prescription -- None None     Home Resting Oxygen Prescription -- None None     Compliance with Home Oxygen Use -- -- Yes       Goals/Expected Outcomes   Short Term Goals -- To learn and exhibit compliance with exercise, home and travel O2 prescription;To learn and understand importance of monitoring SPO2 with pulse oximeter and demonstrate accurate use of the pulse oximeter.;To learn and  understand importance of maintaining oxygen saturations>88%;To learn and demonstrate proper pursed lip breathing techniques or other breathing techniques. ;To learn and demonstrate proper use of respiratory medications Other     Long  Term Goals -- Exhibits compliance with exercise, home  and travel O2 prescription;Verbalizes importance of monitoring SPO2 with pulse oximeter and return demonstration;Maintenance of O2 saturations>88%;Exhibits proper breathing techniques, such as pursed lip breathing or other method taught during program session;Compliance with respiratory medication;Demonstrates proper use of MDI's Other     Comments Reviewed PLB technique with pt.  Talked about how it works and it's importance in maintaining their exercise saturations. Patient has recently completed a sleep study and has been diagnosed with sleep apnea. She is waiting for her CPAP machine to arrive. She will start using that as soon as she gets  it. She does continue to say that her SOB is her main limiting factor with exercise and activity. She does have a pulse ox and moniotrs oxygen saturations at home. Jenna Tyler is doing a sleep study next week. She is supposed to get a CPAP but only slept a few hours when she did the study. Informed her why it is important to use the machine for naps and sleep. Patient verbalizes understanding.     Goals/Expected Outcomes Short: Become more profiecient at using PLB. Long: Become independent at using PLB. Short: obtain CPAP machine and start using it during sleep. Long: become independent with using PLB. Short: Do Sleep study. Long: Use CPAP machine routinely .              Oxygen Discharge (Final Oxygen Re-Evaluation):  Oxygen Re-Evaluation - 05/29/23 0929       Program Oxygen Prescription   Program Oxygen Prescription None      Home Oxygen   Home Oxygen Device None    Sleep Oxygen Prescription CPAP    Home Exercise Oxygen Prescription None    Home Resting Oxygen Prescription None    Compliance with Home Oxygen Use Yes      Goals/Expected Outcomes   Short Term Goals Other    Long  Term Goals Other    Comments Jenna Tyler is doing a sleep study next week. She is supposed to get a CPAP but only slept a few hours when she did the study. Informed her why it is important to use the machine for naps and sleep. Patient verbalizes understanding.    Goals/Expected Outcomes Short: Do Sleep study. Long: Use CPAP machine routinely .             Initial Exercise Prescription:  Initial Exercise Prescription - 04/02/23 1400       Date of Initial Exercise RX and Referring Provider   Date 04/02/23    Referring Provider Dr. Alphonzo Dublin      Oxygen   Maintain Oxygen Saturation 88% or higher      Treadmill   MPH 2.2    Grade 0    Minutes 15    METs 2.68      Recumbant Bike   Level 2    RPM 50    Watts 25    Minutes 15    METs 3.23      NuStep   Level 2    SPM 80    Minutes 15     METs 3.23      Arm Ergometer   Level 1    Watts 25    Minutes 15    METs 3.23  T5 Nustep   Level 2    SPM 80    Minutes 15    METs 3.23      Biostep-RELP   Level 2    SPM 50    Minutes 15    METs 3.23      Track   Laps 41    Minutes 15    METs 3.23      Prescription Details   Duration Progress to 30 minutes of continuous aerobic without signs/symptoms of physical distress      Intensity   THRR 40-80% of Max Heartrate 131-152    Ratings of Perceived Exertion 11-13    Perceived Dyspnea 0-4      Progression   Progression Continue progressive overload as per policy without signs/symptoms or physical distress.      Resistance Training   Training Prescription Yes    Weight 3 lb    Reps 10-15             Perform Capillary Blood Glucose checks as needed.  Exercise Prescription Changes:   Exercise Prescription Changes     Row Name 04/02/23 1400 04/16/23 0800 05/01/23 1100 05/15/23 0900 05/27/23 1000     Response to Exercise   Blood Pressure (Admit) 92/64 106/70 108/82 106/62 --   Blood Pressure (Exercise) 106/64 118/72 132/62 140/64 --   Blood Pressure (Exit) 90/64 120/70 108/60 102/64 --   Heart Rate (Admit) 110 bpm 87 bpm 98 bpm 96 bpm --   Heart Rate (Exercise) 126 bpm 102 bpm 110 bpm 125 bpm --   Heart Rate (Exit) 118 bpm 103 bpm 95 bpm 85 bpm --   Oxygen Saturation (Admit) 95 % 96 % 94 % 97 % --   Oxygen Saturation (Exercise) 89 % 98 % 92 % 91 % --   Oxygen Saturation (Exit) 99 % 96 % 92 % 97 % --   Rating of Perceived Exertion (Exercise) 17 15 15 14  --   Perceived Dyspnea (Exercise) 3 3 3 1  --   Symptoms none none none none --   Comments results First full day of exercise First full day of exercise -- --   Duration -- Progress to 30 minutes of  aerobic without signs/symptoms of physical distress Progress to 30 minutes of  aerobic without signs/symptoms of physical distress Continue with 30 min of aerobic exercise without signs/symptoms of  physical distress. Continue with 30 min of aerobic exercise without signs/symptoms of physical distress.   Intensity -- THRR unchanged THRR unchanged THRR unchanged THRR unchanged     Progression   Progression -- Continue to progress workloads to maintain intensity without signs/symptoms of physical distress. Continue to progress workloads to maintain intensity without signs/symptoms of physical distress. Continue to progress workloads to maintain intensity without signs/symptoms of physical distress. Continue to progress workloads to maintain intensity without signs/symptoms of physical distress.   Average METs -- 2.55 2.55 2 2     Resistance Training   Training Prescription -- Yes Yes Yes Yes   Weight -- 3 lb 3 lb 1 lb 1 lb   Reps -- 10-15 10-15 10-15 10-15     Interval Training   Interval Training -- No No No No     Oxygen   Oxygen -- -- Continuous -- --     Treadmill   MPH -- -- 2.2 1 1    Grade -- -- 0 0 0   Minutes -- -- 15 15 15    METs -- -- 2.69  1.77 1.77     Recumbant Bike   Level -- 2 -- 3 3   Watts -- 19 -- 19 19   Minutes -- 15 -- 15 15   METs -- 2.7 -- 2.71 2.71     NuStep   Level -- 3 -- -- --   Minutes -- 15 -- -- --   METs -- 2.4 -- -- --     T5 Nustep   Level -- -- 3 4 4    Minutes -- -- 15 15 15    METs -- -- 2 1.9 1.9     Biostep-RELP   Level -- -- -- 4 4   Minutes -- -- -- 15 15   METs -- -- -- 2 2     Home Exercise Plan   Plans to continue exercise at -- -- -- -- Home (comment)  walking and looking into a gym membership   Frequency -- -- -- -- Add 2 additional days to program exercise sessions.   Initial Home Exercises Provided -- -- -- -- 05/27/23     Oxygen   Maintain Oxygen Saturation -- 88% or higher 88% or higher 88% or higher 88% or higher    Row Name 05/28/23 0800             Response to Exercise   Blood Pressure (Admit) 126/84       Blood Pressure (Exercise) 130/64       Blood Pressure (Exit) 114/62       Heart Rate (Admit) 70  bpm       Heart Rate (Exercise) 116 bpm       Heart Rate (Exit) 74 bpm       Oxygen Saturation (Admit) 96 %       Oxygen Saturation (Exercise) 92 %       Oxygen Saturation (Exit) 96 %       Rating of Perceived Exertion (Exercise) 15       Perceived Dyspnea (Exercise) 2       Symptoms none       Duration Continue with 30 min of aerobic exercise without signs/symptoms of physical distress.       Intensity THRR unchanged         Progression   Progression Continue to progress workloads to maintain intensity without signs/symptoms of physical distress.       Average METs 2.6         Resistance Training   Training Prescription Yes       Weight 1 lb       Reps 10-15         Interval Training   Interval Training No         Recumbant Bike   Level 4       Watts 37       Minutes 15       METs 3.37         NuStep   Level 4       Minutes 15       METs 2.8         T5 Nustep   Level 3       Minutes 15       METs 2         Home Exercise Plan   Plans to continue exercise at Home (comment)  walking and looking into a gym membership       Frequency Add 2 additional days to program exercise sessions.  Initial Home Exercises Provided 05/27/23         Oxygen   Maintain Oxygen Saturation 88% or higher                Exercise Comments:   Exercise Comments     Row Name 04/08/23 0936           Exercise Comments First full day of exercise!  Patient was oriented to gym and equipment including functions, settings, policies, and procedures.  Patient's individual exercise prescription and treatment plan were reviewed.  All starting workloads were established based on the results of the 6 minute walk test done at initial orientation visit.  The plan for exercise progression was also introduced and progression will be customized based on patient's performance and goals.                Exercise Goals and Review:   Exercise Goals     Row Name 04/02/23 1435              Exercise Goals   Increase Physical Activity Yes       Intervention Provide advice, education, support and counseling about physical activity/exercise needs.;Develop an individualized exercise prescription for aerobic and resistive training based on initial evaluation findings, risk stratification, comorbidities and participant's personal goals.       Expected Outcomes Long Term: Add in home exercise to make exercise part of routine and to increase amount of physical activity.;Long Term: Exercising regularly at least 3-5 days a week.;Short Term: Attend rehab on a regular basis to increase amount of physical activity.       Increase Strength and Stamina Yes       Intervention Develop an individualized exercise prescription for aerobic and resistive training based on initial evaluation findings, risk stratification, comorbidities and participant's personal goals.;Provide advice, education, support and counseling about physical activity/exercise needs.       Expected Outcomes Long Term: Improve cardiorespiratory fitness, muscular endurance and strength as measured by increased METs and functional capacity ( );Short Term: Perform resistance training exercises routinely during rehab and add in resistance training at home;Short Term: Increase workloads from initial exercise prescription for resistance, speed, and METs.       Able to understand and use rate of perceived exertion (RPE) scale Yes       Intervention Provide education and explanation on how to use RPE scale       Expected Outcomes Long Term:  Able to use RPE to guide intensity level when exercising independently;Short Term: Able to use RPE daily in rehab to express subjective intensity level       Able to understand and use Dyspnea scale Yes       Intervention Provide education and explanation on how to use Dyspnea scale       Expected Outcomes Long Term: Able to use Dyspnea scale to guide intensity level when exercising independently;Short  Term: Able to use Dyspnea scale daily in rehab to express subjective sense of shortness of breath during exertion       Knowledge and understanding of Target Heart Rate Range (THRR) Yes       Intervention Provide education and explanation of THRR including how the numbers were predicted and where they are located for reference       Expected Outcomes Long Term: Able to use THRR to govern intensity when exercising independently;Short Term: Able to use daily as guideline for intensity in rehab;Short Term: Able to state/look up THRR  Able to check pulse independently Yes       Intervention Review the importance of being able to check your own pulse for safety during independent exercise;Provide education and demonstration on how to check pulse in carotid and radial arteries.       Expected Outcomes Long Term: Able to check pulse independently and accurately;Short Term: Able to explain why pulse checking is important during independent exercise       Understanding of Exercise Prescription Yes       Intervention Provide education, explanation, and written materials on patient's individual exercise prescription       Expected Outcomes Long Term: Able to explain home exercise prescription to exercise independently;Short Term: Able to explain program exercise prescription                Exercise Goals Re-Evaluation :  Exercise Goals Re-Evaluation     Row Name 04/08/23 0936 04/16/23 0817 05/01/23 1205 05/15/23 0933 05/27/23 1003     Exercise Goal Re-Evaluation   Exercise Goals Review Able to understand and use rate of perceived exertion (RPE) scale;Able to understand and use Dyspnea scale;Knowledge and understanding of Target Heart Rate Range (THRR);Understanding of Exercise Prescription Increase Physical Activity;Increase Strength and Stamina;Understanding of Exercise Prescription Increase Physical Activity;Increase Strength and Stamina;Understanding of Exercise Prescription Increase Physical  Activity;Increase Strength and Stamina;Understanding of Exercise Prescription Increase Physical Activity;Able to understand and use rate of perceived exertion (RPE) scale;Knowledge and understanding of Target Heart Rate Range (THRR);Understanding of Exercise Prescription;Increase Strength and Stamina;Able to understand and use Dyspnea scale;Able to check pulse independently   Comments Reviewed RPE and dyspnea scale, THR and program prescription with pt today.  Pt voiced understanding and was given a copy of goals to take home. Jenna Tyler is off to a good start in the program. She was able to work at level 3 on the T4 nustep and level 2 on the recumbent bike during her first day of exercise. She also did well with 3 lb hand weights for resistance training. We will continue to monitor her progress in the program. Jenna Tyler is doing well in rehab. She was able to use the treadmill at a speed of 2.53mph. She was also able to use the T4 nustep at level 3. We will continue to monitor her progress in the program. Jenna Tyler is doing well in rehab. She increased her level on the recumbent bike to 3 and increased her level to 4 on the T5 nustep and biostep. We will continue to monitor her progress in the program. Reviewed home exercise with pt today from 9:38am to 9:50am.  Pt plans to walk and look into possibly join a gym for exercise.  Reviewed THR, pulse, RPE, sign and symptoms, pulse oximetery and when to call 911 or MD.  Also discussed weather considerations and indoor options.  Pt voiced understanding.   Expected Outcomes Short: Use RPE daily to regulate intensity. Long: Follow program prescription in THR. Short: Continue to follow current exercise prescription. Long: Continue exercise to improve strength and stamina. Short: Continue to follow current exercise prescription. Long: Continue exercise to improve strength and stamina. Short: Continue to progressively increase workloads on the recumbent bike, T5 nustep, and biostep.  Long: Continue exercise to improve strength and stamina. Short: add 1-2 days a week of exercies at home on off days of rehab. Long: maintain independent exercise routine upon graduation from pulmonary rehab.    Row Name 05/28/23 [phone]  Exercise Goal Re-Evaluation   Exercise Goals Review Increase Physical Activity;Increase Strength and Stamina;Understanding of Exercise Prescription       Comments Jenna Tyler is doing well in rehab. She has been able to increase her level on the recumbent bike from level 3 to 4. She was also able to increase to level 4 on the T4 nustep. We will continue to monitor her progress in the program.       Expected Outcomes Short: Continue to increase levels on the Recumbent bike, and T4 nustep. Long: Continue exercise to improve strength and stamina.                Discharge Exercise Prescription (Final Exercise Prescription Changes):  Exercise Prescription Changes - 05/28/23 0800       Response to Exercise   Blood Pressure (Admit) 126/84    Blood Pressure (Exercise) 130/64    Blood Pressure (Exit) 114/62    Heart Rate (Admit) 70 bpm    Heart Rate (Exercise) 116 bpm    Heart Rate (Exit) 74 bpm    Oxygen Saturation (Admit) 96 %    Oxygen Saturation (Exercise) 92 %    Oxygen Saturation (Exit) 96 %    Rating of Perceived Exertion (Exercise) 15    Perceived Dyspnea (Exercise) 2    Symptoms none    Duration Continue with 30 min of aerobic exercise without signs/symptoms of physical distress.    Intensity THRR unchanged      Progression   Progression Continue to progress workloads to maintain intensity without signs/symptoms of physical distress.    Average METs 2.6      Resistance Training   Training Prescription Yes    Weight 1 lb    Reps 10-15      Interval Training   Interval Training No      Recumbant Bike   Level 4    Watts 37    Minutes 15    METs 3.37      NuStep   Level 4    Minutes 15    METs 2.8      T5 Nustep   Level 3     Minutes 15    METs 2      Home Exercise Plan   Plans to continue exercise at Home (comment)   walking and looking into a gym membership   Frequency Add 2 additional days to program exercise sessions.    Initial Home Exercises Provided 05/27/23      Oxygen   Maintain Oxygen Saturation 88% or higher             Nutrition:  Target Goals: Understanding of nutrition guidelines, daily intake of sodium 1500mg , cholesterol 200mg , calories 30% from fat and 7% or less from saturated fats, daily to have 5 or more servings of fruits and vegetables.  Education: All About Nutrition: -Group instruction provided by verbal, written material, interactive activities, discussions, models, and posters to present general guidelines for heart healthy nutrition including fat, fiber, MyPlate, the role of sodium in heart healthy nutrition, utilization of the nutrition label, and utilization of this knowledge for meal planning. Follow up email sent as well. Written material given at graduation. Flowsheet Row Pulmonary Rehab from 05/29/2023 in [facility] Cardiac and Pulmonary Rehab  Date 05/08/23  Educator Ottis Stain  Instruction Review Code 1- Verbalizes Understanding       Biometrics:  Pre Biometrics - 04/02/23 1435       Pre Biometrics   Height 5' 5.8" (1.671 m)  Weight 187 lb 1.6 oz (84.9 kg)    Waist Circumference 41 inches    Hip Circumference 44.5 inches    Waist to Hip Ratio 0.92 %    BMI (Calculated) 30.39    Single Leg Stand 7.15 seconds              Nutrition Therapy Plan and Nutrition Goals:  Nutrition Therapy & Goals - 05/13/23 1401       Nutrition Therapy   Diet carb controlled, mediterranean    Protein (specify units) 90    Fiber 25 grams    Whole Grain Foods 3 servings    Saturated Fats 15 max. grams    Fruits and Vegetables 5 servings/day    Sodium 2 grams      Personal Nutrition Goals   Nutrition Goal Drink less Mt dew and at least 1 bottle of water per day    Personal  Goal #2 Eat a smaller breakfast with 15-30gProtein and 30-60gCarbs at each meal.    Personal Goal #3 Include more colorful produce, aim for 5-8 servings of fruits and veggies per day    Comments She doesn't drink any water, instead drinks mt dew. 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 Commended her on this accomplishment. Spoke with her about her A1C of 6.2%. Educated on carbs counting, target of 30-60g per meal. Went over different carb choices and how to improve the quality of carb choices. Encouraged more fruits and whole grains. Went over several small meal and snack ideas to help her be more consistent with her complex carb intake. Set goal to work on eating smaller breakfast with protein and complex carbs.      Intervention Plan   Intervention Prescribe, educate and counsel regarding individualized specific dietary modifications aiming towards targeted core components such as weight, hypertension, lipid management, diabetes, heart failure and other comorbidities.;Nutrition handout(s) given to patient.    Expected Outcomes Short Term Goal: Understand basic principles of dietary content, such as calories, fat, sodium, cholesterol and nutrients.;Short Term Goal: A plan has been developed with personal nutrition goals set during dietitian appointment.;Long Term Goal: Adherence to prescribed nutrition plan.             Nutrition Assessments:  MEDIFICTS Score Key: >=70 Need to make dietary changes  40-70 Heart Healthy Diet <= 40 Therapeutic Level Cholesterol Diet  Flowsheet Row Pulmonary Rehab from 04/02/2023 in [facility] Cardiac and Pulmonary Rehab  Picture Your Plate Total Score on Admission 52      Picture Your Plate Scores: <16 Unhealthy dietary pattern with much room for improvement. 41-50 Dietary pattern unlikely to meet recommendations for good health and room for improvement. 51-60 More healthful dietary pattern, with some room for improvement.  >60 Healthy dietary pattern, although there may  be some specific behaviors that could be improved.   Nutrition Goals Re-Evaluation:  Nutrition Goals Re-Evaluation     Row Name 04/29/23 1004             Goals   Comment Patient has not met with RD yet. She is interested in meeting for a nutrition appointment. Appointment was scheduled for one on one RD apt on 05/06/2023       Expected Outcome Short: meet with RD and set specific nutrition goals. Long: maintain heart healthy eating habits.                Nutrition Goals Discharge (Final Nutrition Goals Re-Evaluation):  Nutrition Goals Re-Evaluation - 04/29/23 1004  Goals   Comment Patient has not met with RD yet. She is interested in meeting for a nutrition appointment. Appointment was scheduled for one on one RD apt on 05/06/2023    Expected Outcome Short: meet with RD and set specific nutrition goals. Long: maintain heart healthy eating habits.             Psychosocial: Target Goals: Acknowledge presence or absence of significant depression and/or stress, maximize coping skills, provide positive support system. Participant is able to verbalize types and ability to use techniques and skills needed for reducing stress and depression.   Education: Stress, Anxiety, and Depression - Group verbal and visual presentation to define topics covered.  Reviews how body is impacted by stress, anxiety, and depression.  Also discusses healthy ways to reduce stress and to treat/manage anxiety and depression.  Written material given at graduation.   Education: Sleep Hygiene -Provides group verbal and written instruction about how sleep can affect your health.  Define sleep hygiene, discuss sleep cycles and impact of sleep habits. Review good sleep hygiene tips.    Initial Review & Psychosocial Screening:  Initial Psych Review & Screening - 04/01/23 0948       Initial Review   Current issues with Current Sleep Concerns      Family Dynamics   Good Support System? Yes     Comments She can look to her room mate and best friends for support. Sometimes she does not sleep well do to possible sleep apnea. Jenna Tyler goes to a [place] group therapy for recovery.      Barriers   Psychosocial barriers to participate in program The patient should benefit from training in stress management and relaxation.      Screening Interventions   Interventions To provide support and resources with identified psychosocial needs;Encouraged to exercise;Provide feedback about the scores to participant    Expected Outcomes Short Term goal: Utilizing psychosocial counselor, staff and physician to assist with identification of specific Stressors or current issues interfering with healing process. Setting desired goal for each stressor or current issue identified.;Long Term Goal: Stressors or current issues are controlled or eliminated.;Short Term goal: Identification and review with participant of any Quality of Life or Depression concerns found by scoring the questionnaire.;Long Term goal: The participant improves quality of Life and PHQ9 Scores as seen by post scores and/or verbalization of changes             Quality of Life Scores:  Scores of 19 and below usually indicate a poorer quality of life in these areas.  A difference of  2-3 points is a clinically meaningful difference.  A difference of 2-3 points in the total score of the Quality of Life Index has been associated with significant improvement in overall quality of life, self-image, physical symptoms, and general health in studies assessing change in quality of life.  PHQ-9: Review Flowsheet       04/02/2023 07/07/2015 04/04/2015  Depression screen PHQ 2/9  Decreased Interest 0 0 0  Down, Depressed, Hopeless 0 0 0  PHQ - 2 Score 0 0 0  Altered sleeping 2 - -  Tired, decreased energy 1 - -  Change in appetite 0 - -  Feeling bad or failure about yourself  0 - -  Trouble concentrating 1 - -  Moving slowly or  fidgety/restless 0 - -  Suicidal thoughts 0 - -  PHQ-9 Score 4 - -  Difficult doing work/chores Somewhat difficult - -   Interpretation  of Total Score  Total Score Depression Severity:  1-4 = Minimal depression, 5-9 = Mild depression, 10-14 = Moderate depression, 15-19 = Moderately severe depression, 20-27 = Severe depression   Psychosocial Evaluation and Intervention:  Psychosocial Evaluation - 04/01/23 0951       Psychosocial Evaluation & Interventions   Interventions Relaxation education;Stress management education;Encouraged to exercise with the program and follow exercise prescription    Comments She can look to her room mate and best friends for support. Sometimes she does not sleep well do to possible sleep apnea. Jenna Tyler goes to a [place] group therapy for recovery.    Expected Outcomes Short: Start [facility] to help with mood. Long: Maintain a healthy mental state.    Continue Psychosocial Services  Follow up required by staff             Psychosocial Re-Evaluation:  Psychosocial Re-Evaluation     Row Name 04/29/23 1003 05/29/23 0933           Psychosocial Re-Evaluation   Current issues with Current Sleep Concerns Current Sleep Concerns      Comments Patient has recently completed a sleep study and has been diagnosed with sleep apnea. She is waiting for her CPAP machine to arrive. She will start using that as soon as she gets it. She does continue to say that her SOB is her main limiting factor with exercise and activity. Patient reports no issues with their current mental states, sleep, stress, depression or anxiety. Will follow up with patient in a few weeks for any changes.      Expected Outcomes Short: obtain CPAP machine and start using it to aid in sleep quality. Long: maintain good sleep patterns long term. Short: Continue to exercise regularly to support mental health and notify staff of any changes. Long: maintain mental health and well being through teaching of  rehab or prescribed medications independently.      Interventions Encouraged to attend Pulmonary Rehabilitation for the exercise Encouraged to attend Pulmonary Rehabilitation for the exercise      Continue Psychosocial Services  Follow up required by staff Follow up required by staff               Psychosocial Discharge (Final Psychosocial Re-Evaluation):  Psychosocial Re-Evaluation - 05/29/23 0933       Psychosocial Re-Evaluation   Current issues with Current Sleep Concerns    Comments Patient reports no issues with their current mental states, sleep, stress, depression or anxiety. Will follow up with patient in a few weeks for any changes.    Expected Outcomes Short: Continue to exercise regularly to support mental health and notify staff of any changes. Long: maintain mental health and well being through teaching of rehab or prescribed medications independently.    Interventions Encouraged to attend Pulmonary Rehabilitation for the exercise    Continue Psychosocial Services  Follow up required by staff             Education: Education Goals: Education classes will be provided on a weekly basis, covering required topics. Participant will state understanding/return demonstration of topics presented.  Learning Barriers/Preferences:  Learning Barriers/Preferences - 04/01/23 0946       Learning Barriers/Preferences   Learning Barriers None    Learning Preferences None             General Pulmonary Education Topics:  Infection Prevention: - Provides verbal and written material to individual with discussion of infection control including proper hand washing and proper equipment cleaning during  exercise session. Flowsheet Row Pulmonary Rehab from 05/29/2023 in [facility] Cardiac and Pulmonary Rehab  Date 04/01/23  Educator [facility]  Instruction Review Code 1- Verbalizes Understanding       Falls Prevention: - Provides verbal and written material to individual with discussion of  falls prevention and safety. Flowsheet Row Pulmonary Rehab from 05/29/2023 in [facility] Cardiac and Pulmonary Rehab  Date 04/01/23  Educator [facility]  Instruction Review Code 1- Verbalizes Understanding       Chronic Lung Disease Review: - Group verbal instruction with posters, models, PowerPoint presentations and videos,  to review new updates, new respiratory medications, new advancements in procedures and treatments. Providing information on websites and "800" numbers for continued self-education. Includes information about supplement oxygen, available portable oxygen systems, continuous and intermittent flow rates, oxygen safety, concentrators, and Medicare reimbursement for oxygen. Explanation of Pulmonary Drugs, including class, frequency, complications, importance of spacers, rinsing mouth after steroid MDI's, and proper cleaning methods for nebulizers. Review of basic lung anatomy and physiology related to function, structure, and complications of lung disease. Review of risk factors. Discussion about methods for diagnosing sleep apnea and types of masks and machines for OSA. Includes a review of the use of types of environmental controls: home humidity, furnaces, filters, dust mite/pet prevention, HEPA vacuums. Discussion about weather changes, air quality and the benefits of nasal washing. Instruction on Warning signs, infection symptoms, calling MD promptly, preventive modes, and value of vaccinations. Review of effective airway clearance, coughing and/or vibration techniques. Emphasizing that all should Create an Action Plan. Written material given at graduation.   AED/CPR: - Group verbal and written instruction with the use of models to demonstrate the basic use of the AED with the basic ABC's of resuscitation.    Anatomy and Cardiac Procedures: - Group verbal and visual presentation and models provide information about basic cardiac anatomy and function. Reviews the testing methods done to  diagnose heart disease and the outcomes of the test results. Describes the treatment choices: Medical Management, Angioplasty, or Coronary Bypass Surgery for treating various heart conditions including Myocardial Infarction, Angina, Valve Disease, and Cardiac Arrhythmias.  Written material given at graduation.   Medication Safety: - Group verbal and visual instruction to review commonly prescribed medications for heart and lung disease. Reviews the medication, class of the drug, and side effects. Includes the steps to properly store meds and maintain the prescription regimen.  Written material given at graduation. Flowsheet Row Pulmonary Rehab from 05/29/2023 in [facility] Cardiac and Pulmonary Rehab  Date 05/29/23  Educator SB  Instruction Review Code 1- Verbalizes Understanding       Other: -Provides group and verbal instruction on various topics (see comments)   Knowledge Questionnaire Score:    Core Components/Risk Factors/Patient Goals at Admission:  Personal Goals and Risk Factors at Admission - 04/01/23 0946       Core Components/Risk Factors/Patient Goals on Admission    Weight Management Yes    Intervention Weight Management: Develop a combined nutrition and exercise program designed to reach desired caloric intake, while maintaining appropriate intake of nutrient and fiber, sodium and fats, and appropriate energy expenditure required for the weight goal.;Weight Management: Provide education and appropriate resources to help participant work on and attain dietary goals.;Weight Management/Obesity: Establish reasonable short term and long term weight goals.    Expected Outcomes Long Term: Adherence to nutrition and physical activity/exercise program aimed toward attainment of established weight goal;Short Term: Continue to assess and modify interventions until short term weight is achieved;Weight Loss:  Understanding of general recommendations for a balanced deficit meal plan, which promotes  1-2 lb weight loss per week and includes a negative energy balance of [phone] kcal/d;Understanding recommendations for meals to include 15-35% energy as protein, 25-35% energy from fat, 35-60% energy from carbohydrates, less than 200mg  of dietary cholesterol, 20-35 gm of total fiber daily;Understanding of distribution of calorie intake throughout the day with the consumption of 4-5 meals/snacks    Improve shortness of breath with ADL's Yes    Intervention Provide education, individualized exercise plan and daily activity instruction to help decrease symptoms of SOB with activities of daily living.    Expected Outcomes Short Term: Improve cardiorespiratory fitness to achieve a reduction of symptoms when performing ADLs;Long Term: Be able to perform more ADLs without symptoms or delay the onset of symptoms    Diabetes Yes    Intervention Provide education about signs/symptoms and action to take for hypo/hyperglycemia.;Provide education about proper nutrition, including hydration, and aerobic/resistive exercise prescription along with prescribed medications to achieve blood glucose in normal ranges: Fasting glucose 65-99 mg/dL    Expected Outcomes Short Term: Participant verbalizes understanding of the signs/symptoms and immediate care of hyper/hypoglycemia, proper foot care and importance of medication, aerobic/resistive exercise and nutrition plan for blood glucose control.;Long Term: Attainment of HbA1C < 7%.    Hypertension Yes    Intervention Provide education on lifestyle modifcations including regular physical activity/exercise, weight management, moderate sodium restriction and increased consumption of fresh fruit, vegetables, and low fat dairy, alcohol moderation, and smoking cessation.;Monitor prescription use compliance.    Expected Outcomes Short Term: Continued assessment and intervention until BP is < 140/44mm HG in hypertensive participants. < 130/52mm HG in hypertensive participants with  diabetes, heart failure or chronic kidney disease.;Long Term: Maintenance of blood pressure at goal levels.    Lipids Yes    Intervention Provide education and support for participant on nutrition & aerobic/resistive exercise along with prescribed medications to achieve LDL 70mg , HDL >40mg .    Expected Outcomes Short Term: Participant states understanding of desired cholesterol values and is compliant with medications prescribed. Participant is following exercise prescription and nutrition guidelines.;Long Term: Cholesterol controlled with medications as prescribed, with individualized exercise RX and with personalized nutrition plan. Value goals: LDL < 70mg , HDL > 40 mg.             Education:Diabetes - Individual verbal and written instruction to review signs/symptoms of diabetes, desired ranges of glucose level fasting, after meals and with exercise. Acknowledge that pre and post exercise glucose checks will be done for 3 sessions at entry of program. Flowsheet Row Pulmonary Rehab from 05/29/2023 in [facility] Cardiac and Pulmonary Rehab  Date 04/01/23  Educator [facility]  Instruction Review Code 1- Verbalizes Understanding       Know Your Numbers and Heart Failure: - Group verbal and visual instruction to discuss disease risk factors for cardiac and pulmonary disease and treatment options.  Reviews associated critical values for Overweight/Obesity, Hypertension, Cholesterol, and Diabetes.  Discusses basics of heart failure: signs/symptoms and treatments.  Introduces Heart Failure Zone chart for action plan for heart failure.  Written material given at graduation.   Core Components/Risk Factors/Patient Goals Review:   Goals and Risk Factor Review     Row Name 04/29/23 0942 05/29/23 0934           Core Components/Risk Factors/Patient Goals Review   Personal Goals Review Diabetes;Hypertension Improve shortness of breath with ADL's      Review Patient reported that her doctor  had prescribed  medication to help control blood sugars but she has not been taking it. She has been trying to manage her blood sugars with nutrion. She has not told her doctor this and she was encouraged to call her doctor inform them that she has not been taking her DM meds and to discuss options with her care plan. She does have a blood pressure cuff at home but does not consistently use it. She was encouraged to start checking her blood pressure at home. She does take her BP meds. Spoke to patient about their shortness of breath and what they can do to improve. Patient has been informed of breathing techniques when starting the program. Patient is informed to tell staff if they have had any med changes and that certain meds they are taking or not taking can be causing shortness of breath.      Expected Outcomes Short: call doctor to discuss diabetes medicaiton concerns. Start checking BP at home. Long: work with medical team to control cardiac risk factors Short: Attend [facility] regularly to improve shortness of breath with ADL's. Long: maintain independence with ADL's               Core Components/Risk Factors/Patient Goals at Discharge (Final Review):   Goals and Risk Factor Review - 05/29/23 0934       Core Components/Risk Factors/Patient Goals Review   Personal Goals Review Improve shortness of breath with ADL's    Review Spoke to patient about their shortness of breath and what they can do to improve. Patient has been informed of breathing techniques when starting the program. Patient is informed to tell staff if they have had any med changes and that certain meds they are taking or not taking can be causing shortness of breath.    Expected Outcomes Short: Attend [facility] regularly to improve shortness of breath with ADL's. Long: maintain independence with ADL's             ITP Comments:  ITP Comments     Row Name 04/01/23 0945 04/02/23 1426 04/08/23 0936 04/10/23 0937 05/08/23 1245   ITP  Comments Virtual Visit completed. Patient informed on EP and RD appointment and 6 Minute walk test. Patient also informed of patient health questionnaires on My Chart. Patient Verbalizes understanding. Visit diagnosis can be found in [facility] 01/13/[facility]23. Completed and gym orientation. Initial ITP created and sent for review to Dr. Jinny Sanders, Medical Director. First full day of exercise!  Patient was oriented to gym and equipment including functions, settings, policies, and procedures.  Patient's individual exercise prescription and treatment plan were reviewed.  All starting workloads were established based on the results of the 6 minute walk test done at initial orientation visit.  The plan for exercise progression was also introduced and progression will be customized based on patient's performance and goals. 30 Day review completed. Medical Director ITP review done, changes made as directed, and signed approval by Medical Director.    new to program 30 Day review completed. Medical Director ITP review done, changes made as directed, and signed approval by Medical Director.    Row Name 06/05/23 0725           ITP Comments 30 Day review completed. Medical Director ITP review done, changes made as directed, and signed approval by Medical Director.                Comments:

## 2023-06-05 NOTE — Progress Notes (Signed)
Daily Session Note  Patient Details  Name: Jenna Tyler MRN: [id number] Date of Birth: May 10, 1965 Referring Provider:   Flowsheet Row Pulmonary Rehab from 04/02/2023 in [facility] Cardiac and Pulmonary Rehab  Referring Provider Dr. Alphonzo Dublin       Encounter Date: 06/05/2023  Check In:  Session Check In - 06/05/23 1319       Check-In   Supervising physician immediately available to respond to emergencies See telemetry face sheet for immediately available ER MD    Location [facility]-Cardiac & Pulmonary Rehab    Staff Present Rory Percy, MS, Exercise Physiologist;Maxon Conetta BS, Exercise Physiologist;[employer];Cora Collum, RN, BSN, CCRP;Meredith Jewel Baize RN,BSN    Virtual Visit No    Medication changes reported     No    Fall or balance concerns reported    No    Warm-up and Cool-down Performed on first and last piece of equipment    Resistance Training Performed Yes    VAD Patient? No    PAD/SET Patient? No      Pain Assessment   Currently in Pain? No/denies                Social History   Tobacco Use  Smoking Status Former   Current packs/day: 0.00   Average packs/day: 1 pack/day for 15.0 years (15.0 ttl pk-yrs)   Types: Cigarettes   Start date: 12/29/1982   Quit date: 12/28/1997   Years since quitting: 25.4  Smokeless Tobacco Never    Goals Met:  Proper associated with RPD/PD & O2 Sat Independence with exercise equipment Exercise tolerated well No report of concerns or symptoms today  Goals Unmet:  Not Applicable  Comments: Pt able to follow exercise prescription today without complaint.  Will continue to monitor for progression.    Dr. Bethann Punches is Medical Director for [facility] Cardiac Rehabilitation.  Dr. Vida Rigger is Medical Director for [facility] Pulmonary Rehabilitation.

## 2023-06-12 ENCOUNTER — Encounter: Payer: Medicaid Other | Admitting: *Deleted

## 2023-06-12 DIAGNOSIS — J449 Chronic obstructive pulmonary disease, unspecified: Secondary | ICD-10-CM | POA: Diagnosis not present

## 2023-06-12 NOTE — Progress Notes (Signed)
Daily Session Note  Patient Details  Name: Jenna Tyler MRN: [id number] Date of Birth: [birth year] Referring Provider:   Flowsheet Row Pulmonary Rehab from 04/02/2023 in [facility] Cardiac and Pulmonary Rehab  Referring Provider Dr. Alphonzo Dublin       Encounter Date: 06/12/2023  Check In:  Session Check In - 06/12/23 0946       Check-In   Supervising physician immediately available to respond to emergencies See telemetry face sheet for immediately available ER MD    Location [facility]-Cardiac & Pulmonary Rehab    Staff Present Cora Collum, RN, BSN, CCRP;Margaret Best, MS, Exercise Physiologist;Maxon Conetta BS, Exercise Physiologist;Joseph Reino Kent RCP,RRT,BSRT    Virtual Visit No    Medication changes reported     No    Fall or balance concerns reported    No    Warm-up and Cool-down Performed on first and last piece of equipment    Resistance Training Performed Yes    VAD Patient? No    PAD/SET Patient? No      Pain Assessment   Currently in Pain? No/denies                Social History   Tobacco Use  Smoking Status Former   Current packs/day: 0.00   Average packs/day: 1 pack/day for 15.0 years (15.0 ttl pk-yrs)   Types: Cigarettes   Start date: 12/29/1982   Quit date: 12/28/1997   Years since quitting: 25.4  Smokeless Tobacco Never    Goals Met:  Proper associated with RPD/PD & O2 Sat Independence with exercise equipment Exercise tolerated well No report of concerns or symptoms today  Goals Unmet:  Not Applicable  Comments: Pt able to follow exercise prescription today without complaint.  Will continue to monitor for progression.    Dr. Bethann Punches is Medical Director for [facility] Cardiac Rehabilitation.  Dr. Vida Rigger is Medical Director for [facility] Pulmonary Rehabilitation.

## 2023-06-17 ENCOUNTER — Encounter: Payer: Medicaid Other | Admitting: *Deleted

## 2023-06-17 DIAGNOSIS — J449 Chronic obstructive pulmonary disease, unspecified: Secondary | ICD-10-CM | POA: Diagnosis not present

## 2023-06-17 NOTE — Progress Notes (Signed)
 Daily Session Note  Patient Details  Name: Jenna Tyler MRN: [id number] Date of Birth: 06-27-1965 Referring Provider:   Flowsheet Row Pulmonary Rehab from 04/02/2023 in [facility] Cardiac and Pulmonary Rehab  Referring Provider Dr. Alphonzo Dublin       Encounter Date: 06/17/2023  Check In:  Session Check In - 06/17/23 0933       Check-In   Supervising physician immediately available to respond to emergencies See telemetry face sheet for immediately available ER MD    Location [facility]-Cardiac & Pulmonary Rehab    Staff Present Cora Collum, RN, BSN, CCRP;Kelly Hayes BS, ACSM CEP, Exercise Physiologist;Maxon Conetta BS, Exercise Physiologist;Margaret Best, MS, Exercise Physiologist    Virtual Visit No    Medication changes reported     No    Fall or balance concerns reported    No    Warm-up and Cool-down Performed on first and last piece of equipment    Resistance Training Performed Yes    VAD Patient? No    PAD/SET Patient? No      Pain Assessment   Currently in Pain? No/denies                Social History   Tobacco Use  Smoking Status Former   Current packs/day: 0.00   Average packs/day: 1 pack/day for 15.0 years (15.0 ttl pk-yrs)   Types: Cigarettes   Start date: 12/29/1982   Quit date: 12/28/1997   Years since quitting: 25.4  Smokeless Tobacco Never    Goals Met:  Proper associated with RPD/PD & O2 Sat Independence with exercise equipment Exercise tolerated well No report of concerns or symptoms today  Goals Unmet:  Not Applicable  Comments: Pt able to follow exercise prescription today without complaint.  Will continue to monitor for progression.    Dr. Bethann Punches is Medical Director for [facility] Cardiac Rehabilitation.  Dr. Vida Rigger is Medical Director for [facility] Pulmonary Rehabilitation.

## 2023-06-19 ENCOUNTER — Encounter: Payer: Medicaid Other | Admitting: *Deleted

## 2023-06-19 DIAGNOSIS — J449 Chronic obstructive pulmonary disease, unspecified: Secondary | ICD-10-CM | POA: Diagnosis not present

## 2023-06-19 NOTE — Progress Notes (Signed)
 Daily Session Note  Patient Details  Name: Jenna Tyler MRN: [id number] Date of Birth: 1965-12-09 Referring Provider:   Flowsheet Row Pulmonary Rehab from 04/02/2023 in [facility] Cardiac and Pulmonary Rehab  Referring Provider Dr. Alphonzo Dublin       Encounter Date: 06/19/2023  Check In:  Session Check In - 06/19/23 8469       Check-In   Supervising physician immediately available to respond to emergencies See telemetry face sheet for immediately available ER MD    Location [facility]-Cardiac & Pulmonary Rehab    Staff Present Cora Collum, RN, BSN, CCRP;Margaret Best, MS, Exercise Physiologist;Maxon Conetta BS, Exercise Physiologist;Joseph Reino Kent RCP,RRT,BSRT    Virtual Visit No    Medication changes reported     No    Fall or balance concerns reported    No    Warm-up and Cool-down Performed on first and last piece of equipment    Resistance Training Performed Yes    VAD Patient? No    PAD/SET Patient? No      Pain Assessment   Currently in Pain? No/denies                Social History   Tobacco Use  Smoking Status Former   Current packs/day: 0.00   Average packs/day: 1 pack/day for 15.0 years (15.0 ttl pk-yrs)   Types: Cigarettes   Start date: 12/29/1982   Quit date: 12/28/1997   Years since quitting: 25.4  Smokeless Tobacco Never    Goals Met:  Proper associated with RPD/PD & O2 Sat Independence with exercise equipment Exercise tolerated well No report of concerns or symptoms today  Goals Unmet:  Not Applicable  Comments: Pt able to follow exercise prescription today without complaint.  Will continue to monitor for progression.    Dr. Bethann Punches is Medical Director for [facility] Cardiac Rehabilitation.  Dr. Vida Rigger is Medical Director for [facility] Pulmonary Rehabilitation.

## 2023-06-21 ENCOUNTER — Encounter: Payer: Medicaid Other | Admitting: *Deleted

## 2023-06-21 DIAGNOSIS — J449 Chronic obstructive pulmonary disease, unspecified: Secondary | ICD-10-CM | POA: Diagnosis not present

## 2023-06-21 NOTE — Progress Notes (Signed)
 Daily Session Note  Patient Details  Name: Jenna Tyler MRN: [id number] Date of Birth: 1965-05-12 Referring Provider:   Flowsheet Row Pulmonary Rehab from 04/02/2023 in [facility] Cardiac and Pulmonary Rehab  Referring Provider Dr. Alphonzo Dublin       Encounter Date: 06/21/2023  Check In:  Session Check In - 06/21/23 0920       Check-In   Supervising physician immediately available to respond to emergencies See telemetry face sheet for immediately available ER MD    Location [facility]-Cardiac & Pulmonary Rehab    Staff Present Susann Givens RN,BSN;Joseph [facility] [place], [place], Exercise Physiologist    Virtual Visit No    Medication changes reported     No    Fall or balance concerns reported    No    Warm-up and Cool-down Performed on first and last piece of equipment    Resistance Training Performed Yes    VAD Patient? No    PAD/SET Patient? No      Pain Assessment   Currently in Pain? No/denies                Social History   Tobacco Use  Smoking Status Former   Current packs/day: 0.00   Average packs/day: 1 pack/day for 15.0 years (15.0 ttl pk-yrs)   Types: Cigarettes   Start date: 12/29/1982   Quit date: 12/28/1997   Years since quitting: 25.4  Smokeless Tobacco Never    Goals Met:  Independence with exercise equipment Exercise tolerated well No report of concerns or symptoms today Strength training completed today  Goals Unmet:  Not Applicable  Comments: Pt able to follow exercise prescription today without complaint.  Will continue to monitor for progression.    Dr. Bethann Punches is Medical Director for [facility] Cardiac Rehabilitation.  Dr. Vida Rigger is Medical Director for [facility] Pulmonary Rehabilitation.

## 2023-06-24 ENCOUNTER — Encounter: Payer: Medicaid Other | Attending: Thoracic Surgery | Admitting: *Deleted

## 2023-06-24 DIAGNOSIS — J449 Chronic obstructive pulmonary disease, unspecified: Secondary | ICD-10-CM | POA: Diagnosis present

## 2023-06-24 NOTE — Progress Notes (Signed)
 Daily Session Note  Patient Details  Name: Jenna Tyler MRN: [id number] Date of Birth: 1965/06/19 Referring Provider:   Flowsheet Row Pulmonary Rehab from 04/02/2023 in [facility] Cardiac and Pulmonary Rehab  Referring Provider Dr. Alphonzo Dublin       Encounter Date: 06/24/2023  Check In:  Session Check In - 06/24/23 0929       Check-In   Supervising physician immediately available to respond to emergencies See telemetry face sheet for immediately available ER MD    Location [facility]-Cardiac & Pulmonary Rehab    Staff Present Susann Givens RN,BSN;Kelly Madilyn Fireman BS, ACSM CEP, Exercise Physiologist;Jason Wallace Cullens RDN,LDN;Margaret Best, MS, Exercise Physiologist    Virtual Visit No    Medication changes reported     No    Fall or balance concerns reported    No    Warm-up and Cool-down Performed on first and last piece of equipment    Resistance Training Performed Yes    VAD Patient? No    PAD/SET Patient? No      Pain Assessment   Currently in Pain? No/denies                Social History   Tobacco Use  Smoking Status Former   Current packs/day: 0.00   Average packs/day: 1 pack/day for 15.0 years (15.0 ttl pk-yrs)   Types: Cigarettes   Start date: 12/29/1982   Quit date: 12/28/1997   Years since quitting: 25.5  Smokeless Tobacco Never    Goals Met:  Independence with exercise equipment Exercise tolerated well No report of concerns or symptoms today Strength training completed today  Goals Unmet:  Not Applicable  Comments: Pt able to follow exercise prescription today without complaint.  Will continue to monitor for progression.    Dr. Bethann Punches is Medical Director for [facility] Cardiac Rehabilitation.  Dr. Vida Rigger is Medical Director for [facility] Pulmonary Rehabilitation.

## 2023-06-26 ENCOUNTER — Encounter: Payer: Medicaid Other | Admitting: *Deleted

## 2023-06-26 DIAGNOSIS — J449 Chronic obstructive pulmonary disease, unspecified: Secondary | ICD-10-CM

## 2023-06-26 NOTE — Progress Notes (Signed)
 Daily Session Note  Patient Details  Name: Jenna Tyler MRN: [id number] Date of Birth: 1965/07/07 Referring Provider:   Flowsheet Row Pulmonary Rehab from 04/02/2023 in [facility] Cardiac and Pulmonary Rehab  Referring Provider Dr. Alphonzo Dublin       Encounter Date: 06/26/2023  Check In:  Session Check In - 06/26/23 0916       Check-In   Supervising physician immediately available to respond to emergencies See telemetry face sheet for immediately available ER MD    Location [facility]-Cardiac & Pulmonary Rehab    Staff Present Cora Collum, RN, BSN, CCRP;Noah Tickle, BS, Exercise Physiologist;Joseph Hood RCP,RRT,BSRT    Virtual Visit No    Medication changes reported     No    Fall or balance concerns reported    No    Warm-up and Cool-down Performed on first and last piece of equipment    Resistance Training Performed Yes    VAD Patient? No    PAD/SET Patient? No      Pain Assessment   Currently in Pain? No/denies                Social History   Tobacco Use  Smoking Status Former   Current packs/day: 0.00   Average packs/day: 1 pack/day for 15.0 years (15.0 ttl pk-yrs)   Types: Cigarettes   Start date: 12/29/1982   Quit date: 12/28/1997   Years since quitting: 25.5  Smokeless Tobacco Never    Goals Met:  Proper associated with RPD/PD & O2 Sat Independence with exercise equipment Exercise tolerated well No report of concerns or symptoms today  Goals Unmet:  Not Applicable  Comments: Pt able to follow exercise prescription today without complaint.  Will continue to monitor for progression.    Dr. Bethann Punches is Medical Director for [facility] Cardiac Rehabilitation.  Dr. Vida Rigger is Medical Director for [facility] Pulmonary Rehabilitation.

## 2023-07-01 ENCOUNTER — Encounter: Payer: Medicaid Other | Admitting: *Deleted

## 2023-07-01 DIAGNOSIS — J449 Chronic obstructive pulmonary disease, unspecified: Secondary | ICD-10-CM

## 2023-07-01 NOTE — Progress Notes (Signed)
 Daily Session Note  Patient Details  Name: Jenna Tyler MRN: [id number] Date of Birth: Dec 22, 1965 Referring Provider:   Flowsheet Row Pulmonary Rehab from 04/02/2023 in [facility] Cardiac and Pulmonary Rehab  Referring Provider Dr. Alphonzo Dublin       Encounter Date: 07/01/2023  Check In:  Session Check In - 07/01/23 0942       Check-In   Supervising physician immediately available to respond to emergencies See telemetry face sheet for immediately available ER MD    Location [facility]-Cardiac & Pulmonary Rehab    Staff Present Cora Collum, RN, BSN, CCRP;Margaret Best, MS, Exercise Physiologist;Kelly Cloretta Ned, ACSM CEP, Exercise Physiologist    Virtual Visit No    Medication changes reported     No    Fall or balance concerns reported    No    Warm-up and Cool-down Performed on first and last piece of equipment    Resistance Training Performed Yes    VAD Patient? No    PAD/SET Patient? No      Pain Assessment   Currently in Pain? No/denies                Social History   Tobacco Use  Smoking Status Former   Current packs/day: 0.00   Average packs/day: 1 pack/day for 15.0 years (15.0 ttl pk-yrs)   Types: Cigarettes   Start date: 12/29/1982   Quit date: 12/28/1997   Years since quitting: 25.5  Smokeless Tobacco Never    Goals Met:  Proper associated with RPD/PD & O2 Sat Independence with exercise equipment Exercise tolerated well No report of concerns or symptoms today  Goals Unmet:  Not Applicable  Comments: Pt able to follow exercise prescription today without complaint.  Will continue to monitor for progression.    Dr. Bethann Punches is Medical Director for [facility] Cardiac Rehabilitation.  Dr. Vida Rigger is Medical Director for [facility] Pulmonary Rehabilitation.

## 2023-07-03 ENCOUNTER — Encounter

## 2023-07-03 ENCOUNTER — Encounter: Payer: Self-pay | Admitting: *Deleted

## 2023-07-03 DIAGNOSIS — J449 Chronic obstructive pulmonary disease, unspecified: Secondary | ICD-10-CM

## 2023-07-03 NOTE — Progress Notes (Signed)
 Pulmonary Individual Treatment Plan  Patient Details  Name: Jenna Tyler MRN: [id number] Date of Birth: 08-03-1965 Referring Provider:   Flowsheet Row Pulmonary Rehab from 04/02/2023 in [facility] Cardiac and Pulmonary Rehab  Referring Provider Dr. Alphonzo Dublin       Initial Encounter Date:  Flowsheet Row Pulmonary Rehab from 04/02/2023 in [facility] Cardiac and Pulmonary Rehab  Date 04/02/23       Visit Diagnosis: Chronic obstructive pulmonary disease, unspecified COPD type (HCC)  Patient's Home Medications on Admission:  Current Outpatient Medications:    albuterol (PROVENTIL) (2.5 MG/3ML) 0.083% nebulizer solution, Inhale into the lungs., Disp: , Rfl:    albuterol (VENTOLIN HFA) 108 (90 Base) MCG/ACT inhaler, Inhale 1-2 puffs into the lungs every 4 (four) hours as needed. (Patient not taking: Reported on 04/01/2023), Disp: , Rfl:    Blood Glucose Monitoring Suppl (GLUCOCOM BLOOD GLUCOSE MONITOR) DEVI, 1 each as directed, Disp: , Rfl:    budesonide (PULMICORT) 0.25 MG/2ML nebulizer solution, Inhale into the lungs., Disp: , Rfl:    butalbital-acetaminophen-caffeine (FIORICET) 50-325-40 MG tablet, Take 1 tablet by mouth every 4 (four) hours as needed for headache., Disp: 14 tablet, Rfl: 0   chlorpheniramine-HYDROcodone (TUSSIONEX) 10-8 MG/5ML, Take 5 mLs by mouth every 12 (twelve) hours., Disp: 70 mL, Rfl: 0   Cholecalciferol 50 MCG (2000 UT) TABS, Take by mouth. (Patient not taking: Reported on 04/01/2023), Disp: , Rfl:    Cysteamine Bitartrate (PROCYSBI) 300 MG PACK, Use 1 each once daily Use as instructed., Disp: , Rfl:    dicyclomine (BENTYL) 20 MG tablet, Take 1 tablet (20 mg total) by mouth every 8 (eight) hours as needed., Disp: 15 tablet, Rfl: 0   doxycycline (VIBRAMYCIN) 100 MG capsule, SMARTSIG:1.0 Capsule(s) By Mouth Twice Daily, Disp: , Rfl:    gabapentin (NEURONTIN) 800 MG tablet, Take 800 mg by mouth 2 (two) times daily., Disp: , Rfl:    glucose blood (ACCU-CHEK GUIDE  TEST) test strip, 1 each (1 strip total) once daily Use as instructed., Disp: , Rfl:    HYDROcodone-acetaminophen (NORCO/VICODIN) 5-325 MG tablet, Take 1 tablet by mouth every 4 (four) hours as needed., Disp: 15 tablet, Rfl: 0   hydrOXYzine (ATARAX) 25 MG tablet, Take by mouth. (Patient not taking: Reported on 04/01/2023), Disp: , Rfl:    ibuprofen (ADVIL) 200 MG tablet, Take by mouth., Disp: , Rfl:    levothyroxine (SYNTHROID) 100 MCG tablet, Take 100 mcg by mouth daily., Disp: , Rfl:    meclizine (ANTIVERT) 25 MG tablet, Take 25mg  1-3 times daily as needed (Patient not taking: Reported on 04/01/2023), Disp: , Rfl:    metFORMIN (GLUCOPHAGE) 500 MG tablet, Take 500 mg by mouth 2 (two) times daily. (Patient not taking: Reported on 04/01/2023), Disp: , Rfl:    methocarbamol (ROBAXIN) 750 MG tablet, Take 750 mg by mouth 2 (two) times daily. (Patient not taking: Reported on 04/01/2023), Disp: , Rfl:    metoprolol tartrate (LOPRESSOR) 25 MG tablet, Take 25 mg by mouth every morning., Disp: , Rfl:    Multiple Vitamin (MULTIVITAMIN WITH MINERALS) TABS tablet, Take 1 tablet by mouth daily. (Patient not taking: Reported on 04/01/2023), Disp: , Rfl:    nortriptyline (PAMELOR) 10 MG capsule, , Disp: , Rfl:    omeprazole (PRILOSEC) 40 MG capsule, Take by mouth., Disp: , Rfl:    ondansetron (ZOFRAN) 4 MG tablet, Take 8 mg by mouth 2 (two) times daily., Disp: , Rfl:    ondansetron (ZOFRAN-ODT) 4 MG disintegrating tablet, Take 1 tablet (  4 mg total) by mouth every 6 (six) hours as needed for nausea or vomiting. (Patient not taking: Reported on 04/01/2023), Disp: 20 tablet, Rfl: 0   polyethylene glycol (MIRALAX) 17 g packet, Take 17 g by mouth daily. (Patient not taking: Reported on 04/01/2023), Disp: 30 each, Rfl: 0   rOPINIRole (REQUIP) 0.5 MG tablet, Take 1 mg by mouth 2 (two) times daily., Disp: , Rfl:    SYMBICORT 160-4.5 MCG/ACT inhaler, Inhale 2 puffs into the lungs 2 (two) times daily., Disp: , Rfl:   Past Medical  History: Past Medical History:  Diagnosis Date   AKI (acute kidney injury) (HCC) 07/12/2017   Asthma    COPD (chronic obstructive pulmonary disease) (HCC)    COPD exacerbation (HCC) 12/29/2016   COPD with acute exacerbation (HCC) 10/02/2022   DM (diabetes mellitus), type 2 (HCC)    Dyspnea    Fibromyalgia    Guillain Barr syndrome (HCC)    Headache    Hyperlipidemia    Hypertension    Hypothyroidism    Neurofibromatosis, peripheral, NF1 (HCC) 2017   Pancreatitis    PONV (postoperative nausea and vomiting)    Pulmonary nodule    RLS (restless legs syndrome)    Sleep apnea    never went to pick up her cpap   Tachycardia    Tracheomalacia     Tobacco Use: Social History   Tobacco Use  Smoking Status Former   Current packs/day: 0.00   Average packs/day: 1 pack/day for 15.0 years (15.0 ttl pk-yrs)   Types: Cigarettes   Start date: 12/29/1982   Quit date: 12/28/1997   Years since quitting: 25.5  Smokeless Tobacco Never    Labs: Review Flowsheet  More data may exist      Latest Ref Rng & Units 06/27/2015 12/29/2016 07/09/2019 07/10/2019 10/04/2021  Labs for ITP Cardiac and Pulmonary Rehab  Cholestrol 0 - 200 mg/dL - 409  - - -  LDL (calc) 0 - 99 mg/dL - 811  - - -  HDL-C >91 mg/dL - 47  - - -  Trlycerides <150 mg/dL - 478  - - -  Hemoglobin A1c 4.8 - 5.6 % 5.9  5.7  - 6.3  6.2   PH, Arterial 7.350 - 7.450 - - 7.39  - -  PCO2 arterial 32.0 - 48.0 mmHg - - 30  - -  Bicarbonate 20.0 - 28.0 mmol/L - - 18.2  - -  Acid-base deficit 0.0 - 2.0 mmol/L - - 5.6  - -  O2 Saturation % - - 97.7  - -     Pulmonary Assessment Scores:   UCSD: Self-administered rating of dyspnea associated with activities of daily living (ADLs) 6-point scale (0 = "not at all" to 5 = "maximal or unable to do because of breathlessness")  Scoring Scores range from 0 to 120.  Minimally important difference is 5 units  CAT: CAT can identify the health impairment of COPD patients and is better correlated  with disease progression.  CAT has a scoring range of zero to 40. The CAT score is classified into four groups of low (less than 10), medium (10 - 20), high (21-30) and very high (31-40) based on the impact level of disease on health status. A CAT score over 10 suggests significant symptoms.  A worsening CAT score could be explained by an exacerbation, poor medication adherence, poor inhaler technique, or progression of COPD or comorbid conditions.  CAT MCID is 2 points  mMRC: mMRC (Modified Medical  Research Council) Dyspnea Scale is used to assess the degree of baseline functional disability in patients of respiratory disease due to dyspnea. No minimal important difference is established. A decrease in score of 1 point or greater is considered a positive change.   Pulmonary Function Assessment:  Pulmonary Function Assessment - 04/01/23 0946       Breath   Shortness of Breath Yes;Limiting activity             Exercise Target Goals: Exercise Program Goal: Individual exercise prescription set using results from initial 6 min walk test and THRR while considering  patient's activity barriers and safety.   Exercise Prescription Goal: Initial exercise prescription builds to 30-45 minutes a day of aerobic activity, 2-3 days per week.  Home exercise guidelines will be given to patient during program as part of exercise prescription that the participant will acknowledge.  Education: Aerobic Exercise: - Group verbal and visual presentation on the components of exercise prescription. Introduces F.I.T.T principle from ACSM for exercise prescriptions.  Reviews F.I.T.T. principles of aerobic exercise including progression. Written material given at graduation.   Education: Resistance Exercise: - Group verbal and visual presentation on the components of exercise prescription. Introduces F.I.T.T principle from ACSM for exercise prescriptions  Reviews F.I.T.T. principles of resistance exercise  including progression. Written material given at graduation.    Education: Exercise & Equipment Safety: - Individual verbal instruction and demonstration of equipment use and safety with use of the equipment. Flowsheet Row Pulmonary Rehab from 06/26/2023 in [facility] Cardiac and Pulmonary Rehab  Date 04/01/23  Educator jh  Instruction Review Code 1- Verbalizes Understanding       Education: Exercise Physiology & General Exercise Guidelines: - Group verbal and written instruction with models to review the exercise physiology of the cardiovascular system and associated critical values. Provides general exercise guidelines with specific guidelines to those with heart or lung disease.  Flowsheet Row Pulmonary Rehab from 06/26/2023 in [facility] Cardiac and Pulmonary Rehab  Date 06/26/23  Educator NT  Instruction Review Code 1- [employer] Understanding       Education: Flexibility, Balance, Mind/Body Relaxation: - Group verbal and visual presentation with interactive activity on the components of exercise prescription. Introduces F.I.T.T principle from ACSM for exercise prescriptions. Reviews F.I.T.T. principles of flexibility and balance exercise training including progression. Also discusses the mind body connection.  Reviews various relaxation techniques to help reduce and manage stress (i.e. Deep breathing, progressive muscle relaxation, and visualization). Balance handout provided to take home. Written material given at graduation.   Activity Barriers & Risk Stratification:  Activity Barriers & Cardiac Risk Stratification - 04/02/23 1431       Activity Barriers & Cardiac Risk Stratification   Activity Barriers Fibromyalgia;Muscular Weakness;Shortness of Breath;Assistive Device   Cane            6 Minute Walk:  6 Minute Walk     Row Name 04/02/23 1427         6 Minute Walk   Phase Initial     Distance 1180 feet     Walk Time 6 minutes     # of Rest Breaks 0     MPH 2.2     METS  3.23     RPE 17     Perceived Dyspnea  3     VO2 Peak 11.3     Symptoms No     Resting HR 110 bpm     Resting BP 92/64     Resting Oxygen Saturation  95 %     Exercise Oxygen Saturation  during 6 min walk 89 %     Max Ex. HR 126 bpm     Max Ex. BP 106/64     2 Minute Post BP 90/64       Interval HR   1 Minute HR 116     2 Minute HR 126     3 Minute HR 125     4 Minute HR 121     5 Minute HR 125     6 Minute HR 126     2 Minute Post HR 118     Interval Heart Rate? Yes       Interval Oxygen   Interval Oxygen? Yes     Baseline Oxygen Saturation % 95 %     1 Minute Oxygen Saturation % 95 %     2 Minute Oxygen Saturation % 93 %     3 Minute Oxygen Saturation % 91 %     4 Minute Oxygen Saturation % 89 %     5 Minute Oxygen Saturation % 97 %     6 Minute Oxygen Saturation % 98 %     2 Minute Post Oxygen Saturation % 99 %             Oxygen Initial Assessment:  Oxygen Initial Assessment - 04/01/23 0946       Home Oxygen   Home Oxygen Device None    Sleep Oxygen Prescription None    Home Exercise Oxygen Prescription None    Home Resting Oxygen Prescription None      Initial 6 min Walk   Oxygen Used None      Program Oxygen Prescription   Program Oxygen Prescription None      Intervention   Short Term Goals To learn and exhibit compliance with exercise, home and travel O2 prescription;To learn and understand importance of monitoring SPO2 with pulse oximeter and demonstrate accurate use of the pulse oximeter.;To learn and understand importance of maintaining oxygen saturations>88%;To learn and demonstrate proper pursed lip breathing techniques or other breathing techniques. ;To learn and demonstrate proper use of respiratory medications    Long  Term Goals Exhibits compliance with exercise, home  and travel O2 prescription;Verbalizes importance of monitoring SPO2 with pulse oximeter and return demonstration;Maintenance of O2 saturations>88%;Exhibits proper breathing  techniques, such as pursed lip breathing or other method taught during program session;Compliance with respiratory medication;Demonstrates proper use of MDI's             Oxygen Re-Evaluation:  Oxygen Re-Evaluation     Row Name 04/08/23 1610 04/29/23 0959 05/29/23 0929 06/12/23 0945       Program Oxygen Prescription   Program Oxygen Prescription -- None None None      Home Oxygen   Home Oxygen Device -- None None None    Sleep Oxygen Prescription -- CPAP  Patient has recently been diagnosed with sleep apnea and is waiting for her CPAP machine to arrive. CPAP CPAP    Home Exercise Oxygen Prescription -- None None None    Home Resting Oxygen Prescription -- None None None    Compliance with Home Oxygen Use -- -- Yes Yes      Goals/Expected Outcomes   Short Term Goals -- To learn and exhibit compliance with exercise, home and travel O2 prescription;To learn and understand importance of monitoring SPO2 with pulse oximeter and demonstrate accurate use of the pulse oximeter.;To learn and understand importance of maintaining  oxygen saturations>88%;To learn and demonstrate proper pursed lip breathing techniques or other breathing techniques. ;To learn and demonstrate proper use of respiratory medications Other Other    Long  Term Goals -- Exhibits compliance with exercise, home  and travel O2 prescription;Verbalizes importance of monitoring SPO2 with pulse oximeter and return demonstration;Maintenance of O2 saturations>88%;Exhibits proper breathing techniques, such as pursed lip breathing or other method taught during program session;Compliance with respiratory medication;Demonstrates proper use of MDI's Other Other    Comments Reviewed PLB technique with pt.  Talked about how it works and it's importance in maintaining their exercise saturations. Patient has recently completed a sleep study and has been diagnosed with sleep apnea. She is waiting for her CPAP machine to arrive. She will start  using that as soon as she gets it. She does continue to say that her SOB is her main limiting factor with exercise and activity. She does have a pulse ox and moniotrs oxygen saturations at home. Esperanza is doing a sleep study next week. She is supposed to get a CPAP but only slept a few hours when she did the study. Informed her why it is important to use the machine for naps and sleep. Patient verbalizes understanding. Spoke about the importance of wearing her CPAP once it is delivered. She reports she understands and is optimistic it will help her get better sleeep    Goals/Expected Outcomes Short: Become more profiecient at using PLB. Long: Become independent at using PLB. Short: obtain CPAP machine and start using it during sleep. Long: become independent with using PLB. Short: Do Sleep study. Long: Use CPAP machine routinely . STG: get CPAP and build habit of using it. LTG: Use CPAP routinely             Oxygen Discharge (Final Oxygen Re-Evaluation):  Oxygen Re-Evaluation - 06/12/23 0945       Program Oxygen Prescription   Program Oxygen Prescription None      Home Oxygen   Home Oxygen Device None    Sleep Oxygen Prescription CPAP    Home Exercise Oxygen Prescription None    Home Resting Oxygen Prescription None    Compliance with Home Oxygen Use Yes      Goals/Expected Outcomes   Short Term Goals Other    Long  Term Goals Other    Comments Spoke about the importance of wearing her CPAP once it is delivered. She reports she understands and is optimistic it will help her get better sleeep    Goals/Expected Outcomes STG: get CPAP and build habit of using it. LTG: Use CPAP routinely             Initial Exercise Prescription:  Initial Exercise Prescription - 04/02/23 1400       Date of Initial Exercise RX and Referring Provider   Date 04/02/23    Referring Provider Dr. Alphonzo Dublin      Oxygen   Maintain Oxygen Saturation 88% or higher      Treadmill   MPH 2.2     Grade 0    Minutes 15    METs 2.68      Recumbant Bike   Level 2    RPM 50    Watts 25    Minutes 15    METs 3.23      NuStep   Level 2    SPM 80    Minutes 15    METs 3.23      Arm Ergometer   Level 1  Watts 25    Minutes 15    METs 3.23      T5 Nustep   Level 2    SPM 80    Minutes 15    METs 3.23      Biostep-RELP   Level 2    SPM 50    Minutes 15    METs 3.23      Track   Laps 41    Minutes 15    METs 3.23      Prescription Details   Duration Progress to 30 minutes of continuous aerobic without signs/symptoms of physical distress      Intensity   THRR 40-80% of Max Heartrate 131-152    Ratings of Perceived Exertion 11-13    Perceived Dyspnea 0-4      Progression   Progression Continue progressive overload as per policy without signs/symptoms or physical distress.      Resistance Training   Training Prescription Yes    Weight 3 lb    Reps 10-15             Perform Capillary Blood Glucose checks as needed.  Exercise Prescription Changes:   Exercise Prescription Changes     Row Name 04/02/23 1400 04/16/23 0800 05/01/23 1100 05/15/23 0900 05/27/23 1000     Response to Exercise   Blood Pressure (Admit) 92/64 106/70 108/82 106/62 --   Blood Pressure (Exercise) 106/64 118/72 132/62 140/64 --   Blood Pressure (Exit) 90/64 120/70 108/60 102/64 --   Heart Rate (Admit) 110 bpm 87 bpm 98 bpm 96 bpm --   Heart Rate (Exercise) 126 bpm 102 bpm 110 bpm 125 bpm --   Heart Rate (Exit) 118 bpm 103 bpm 95 bpm 85 bpm --   Oxygen Saturation (Admit) 95 % 96 % 94 % 97 % --   Oxygen Saturation (Exercise) 89 % 98 % 92 % 91 % --   Oxygen Saturation (Exit) 99 % 96 % 92 % 97 % --   Rating of Perceived Exertion (Exercise) 17 15 15 14  --   Perceived Dyspnea (Exercise) 3 3 3 1  --   Symptoms none none none none --   Comments results First full day of exercise First full day of exercise -- --   Duration -- Progress to 30 minutes of  aerobic without  signs/symptoms of physical distress Progress to 30 minutes of  aerobic without signs/symptoms of physical distress Continue with 30 min of aerobic exercise without signs/symptoms of physical distress. Continue with 30 min of aerobic exercise without signs/symptoms of physical distress.   Intensity -- THRR unchanged THRR unchanged THRR unchanged THRR unchanged     Progression   Progression -- Continue to progress workloads to maintain intensity without signs/symptoms of physical distress. Continue to progress workloads to maintain intensity without signs/symptoms of physical distress. Continue to progress workloads to maintain intensity without signs/symptoms of physical distress. Continue to progress workloads to maintain intensity without signs/symptoms of physical distress.   Average METs -- 2.55 2.55 2 2     Resistance Training   Training Prescription -- Yes Yes Yes Yes   Weight -- 3 lb 3 lb 1 lb 1 lb   Reps -- 10-15 10-15 10-15 10-15     Interval Training   Interval Training -- No No No No     Oxygen   Oxygen -- -- Continuous -- --     Treadmill   MPH -- -- 2.2 1 1    Grade -- --  0 0 0   Minutes -- -- 15 15 15    METs -- -- 2.69 1.77 1.77     Recumbant Bike   Level -- 2 -- 3 3   Watts -- 19 -- 19 19   Minutes -- 15 -- 15 15   METs -- 2.7 -- 2.71 2.71     NuStep   Level -- 3 -- -- --   Minutes -- 15 -- -- --   METs -- 2.4 -- -- --     T5 Nustep   Level -- -- 3 4 4    Minutes -- -- 15 15 15    METs -- -- 2 1.9 1.9     Biostep-RELP   Level -- -- -- 4 4   Minutes -- -- -- 15 15   METs -- -- -- 2 2     Home Exercise Plan   Plans to continue exercise at -- -- -- -- Home (comment)  walking and looking into a gym membership   Frequency -- -- -- -- Add 2 additional days to program exercise sessions.   Initial Home Exercises Provided -- -- -- -- 05/27/23     Oxygen   Maintain Oxygen Saturation -- 88% or higher 88% or higher 88% or higher 88% or higher    Row Name 05/28/23  0800 06/13/23 1700 06/25/23 1500         Response to Exercise   Blood Pressure (Admit) 126/84 110/60 130/76     Blood Pressure (Exercise) 130/64 -- --     Blood Pressure (Exit) 114/62 122/70 110/64     Heart Rate (Admit) 70 bpm 76 bpm 83 bpm     Heart Rate (Exercise) 116 bpm 125 bpm 126 bpm     Heart Rate (Exit) 74 bpm 89 bpm 101 bpm     Oxygen Saturation (Admit) 96 % -- 96 %     Oxygen Saturation (Exercise) 92 % -- 93 %     Oxygen Saturation (Exit) 96 % -- 92 %     Rating of Perceived Exertion (Exercise) 15 16 13      Perceived Dyspnea (Exercise) 2 1 2      Symptoms none none none     Duration Continue with 30 min of aerobic exercise without signs/symptoms of physical distress. Continue with 30 min of aerobic exercise without signs/symptoms of physical distress. Continue with 30 min of aerobic exercise without signs/symptoms of physical distress.     Intensity THRR unchanged THRR unchanged THRR unchanged       Progression   Progression Continue to progress workloads to maintain intensity without signs/symptoms of physical distress. Continue to progress workloads to maintain intensity without signs/symptoms of physical distress. Continue to progress workloads to maintain intensity without signs/symptoms of physical distress.     Average METs 2.6 2.2 2.31       Resistance Training   Training Prescription Yes Yes Yes     Weight 1 lb 1 lb 1 lb     Reps 10-15 10-15 10-15       Interval Training   Interval Training No No No       Treadmill   MPH -- 1 1.3     Grade -- 0 0     Minutes -- 15 15     METs -- 1.77 1.99       Recumbant Bike   Level 4 3 4      Watts 37 22 36     Minutes 15 15  15     METs 3.37 2.83 --       NuStep   Level 4 4 3      Minutes 15 15 15      METs 2.8 1.8 2.3       T5 Nustep   Level 3 3 --     Minutes 15 15 --     METs 2 1.8 --       Biostep-RELP   Level -- 4 5     Minutes -- 15 15     METs -- 2 3       Home Exercise Plan   Plans to continue  exercise at Home (comment)  walking and looking into a gym membership Home (comment)  walking and looking into a gym membership Home (comment)  walking and looking into a gym membership     Frequency Add 2 additional days to program exercise sessions. Add 2 additional days to program exercise sessions. Add 2 additional days to program exercise sessions.     Initial Home Exercises Provided 05/27/23 05/27/23 05/27/23       Oxygen   Maintain Oxygen Saturation 88% or higher 88% or higher 88% or higher              Exercise Comments:   Exercise Comments     Row Name 04/08/23 0936           Exercise Comments First full day of exercise!  Patient was oriented to gym and equipment including functions, settings, policies, and procedures.  Patient's individual exercise prescription and treatment plan were reviewed.  All starting workloads were established based on the results of the 6 minute walk test done at initial orientation visit.  The plan for exercise progression was also introduced and progression will be customized based on patient's performance and goals.                Exercise Goals and Review:   Exercise Goals     Row Name 04/02/23 1435             Exercise Goals   Increase Physical Activity Yes       Intervention Provide advice, education, support and counseling about physical activity/exercise needs.;Develop an individualized exercise prescription for aerobic and resistive training based on initial evaluation findings, risk stratification, comorbidities and participant's personal goals.       Expected Outcomes Long Term: Add in home exercise to make exercise part of routine and to increase amount of physical activity.;Long Term: Exercising regularly at least 3-5 days a week.;Short Term: Attend rehab on a regular basis to increase amount of physical activity.       Increase Strength and Stamina Yes       Intervention Develop an individualized exercise prescription for  aerobic and resistive training based on initial evaluation findings, risk stratification, comorbidities and participant's personal goals.;Provide advice, education, support and counseling about physical activity/exercise needs.       Expected Outcomes Long Term: Improve cardiorespiratory fitness, muscular endurance and strength as measured by increased METs and functional capacity ( );Short Term: Perform resistance training exercises routinely during rehab and add in resistance training at home;Short Term: Increase workloads from initial exercise prescription for resistance, speed, and METs.       Able to understand and use rate of perceived exertion (RPE) scale Yes       Intervention Provide education and explanation on how to use RPE scale       Expected Outcomes Long Term:  Able to use RPE to guide intensity level when exercising independently;Short Term: Able to use RPE daily in rehab to express subjective intensity level       Able to understand and use Dyspnea scale Yes       Intervention Provide education and explanation on how to use Dyspnea scale       Expected Outcomes Long Term: Able to use Dyspnea scale to guide intensity level when exercising independently;Short Term: Able to use Dyspnea scale daily in rehab to express subjective sense of shortness of breath during exertion       Knowledge and understanding of Target Heart Rate Range (THRR) Yes       Intervention Provide education and explanation of THRR including how the numbers were predicted and where they are located for reference       Expected Outcomes Long Term: Able to use THRR to govern intensity when exercising independently;Short Term: Able to use daily as guideline for intensity in rehab;Short Term: Able to state/look up THRR       Able to check pulse independently Yes       Intervention Review the importance of being able to check your own pulse for safety during independent exercise;Provide education and demonstration on how  to check pulse in carotid and radial arteries.       Expected Outcomes Long Term: Able to check pulse independently and accurately;Short Term: Able to explain why pulse checking is important during independent exercise       Understanding of Exercise Prescription Yes       Intervention Provide education, explanation, and written materials on patient's individual exercise prescription       Expected Outcomes Long Term: Able to explain home exercise prescription to exercise independently;Short Term: Able to explain program exercise prescription                Exercise Goals Re-Evaluation :  Exercise Goals Re-Evaluation     Row Name 04/08/23 0936 04/16/23 0817 05/01/23 1205 05/15/23 0933 05/27/23 1003     Exercise Goal Re-Evaluation   Exercise Goals Review Able to understand and use rate of perceived exertion (RPE) scale;Able to understand and use Dyspnea scale;Knowledge and understanding of Target Heart Rate Range (THRR);Understanding of Exercise Prescription Increase Physical Activity;Increase Strength and Stamina;Understanding of Exercise Prescription Increase Physical Activity;Increase Strength and Stamina;Understanding of Exercise Prescription Increase Physical Activity;Increase Strength and Stamina;Understanding of Exercise Prescription Increase Physical Activity;Able to understand and use rate of perceived exertion (RPE) scale;Knowledge and understanding of Target Heart Rate Range (THRR);Understanding of Exercise Prescription;Increase Strength and Stamina;Able to understand and use Dyspnea scale;Able to check pulse independently   Comments Reviewed RPE and dyspnea scale, THR and program prescription with pt today.  Pt voiced understanding and was given a copy of goals to take home. Shirely is off to a good start in the program. She was able to work at level 3 on the T4 nustep and level 2 on the recumbent bike during her first day of exercise. She also did well with 3 lb hand weights for  resistance training. We will continue to monitor her progress in the program. Vicy is doing well in rehab. She was able to use the treadmill at a speed of 2.56mph. She was also able to use the T4 nustep at level 3. We will continue to monitor her progress in the program. Marya is doing well in rehab. She increased her level on the recumbent bike to 3 and increased her level to 4  on the T5 nustep and biostep. We will continue to monitor her progress in the program. Reviewed home exercise with pt today from 9:38am to 9:50am.  Pt plans to walk and look into possibly join a gym for exercise.  Reviewed THR, pulse, RPE, sign and symptoms, pulse oximetery and when to call 911 or MD.  Also discussed weather considerations and indoor options.  Pt voiced understanding.   Expected Outcomes Short: Use RPE daily to regulate intensity. Long: Follow program prescription in THR. Short: Continue to follow current exercise prescription. Long: Continue exercise to improve strength and stamina. Short: Continue to follow current exercise prescription. Long: Continue exercise to improve strength and stamina. Short: Continue to progressively increase workloads on the recumbent bike, T5 nustep, and biostep. Long: Continue exercise to improve strength and stamina. Short: add 1-2 days a week of exercies at home on off days of rehab. Long: maintain independent exercise routine upon graduation from pulmonary rehab.    Row Name 05/28/23 1610 06/12/23 0947 06/13/23 1730 06/25/23 1517       Exercise Goal Re-Evaluation   Exercise Goals Review Increase Physical Activity;Increase Strength and Stamina;Understanding of Exercise Prescription Increase Physical Activity;Increase Strength and Stamina;Understanding of Exercise Prescription Increase Physical Activity;Increase Strength and Stamina;Understanding of Exercise Prescription Increase Physical Activity;Increase Strength and Stamina;Understanding of Exercise Prescription    Comments Tariana is  doing well in rehab. She has been able to increase her level on the recumbent bike from level 3 to 4. She was also able to increase to level 4 on the T4 nustep. We will continue to monitor her progress in the program. Kortlynn is on T4 currently on level 4. She reports she is doing well, feeling that she can push her self more than when she started. Enocuraged her to work towards exercising at home more, drinking water when she exercises will help with her water goal too. Thereasa is doing well in rehab. She was not able to increase her workloads during this review, but was able to maintain them. She was able to use the treadmill at a speed of and no incline, and maintained level 3 on the T5 nustep and recumbent bike. We will continue to monitor her progress in the program. Shakema is doing well in rehab. She increased her workload on the treadmill to a speed of 1.3 mph with no incline. She also increased to level 5 on the biostep and level 4 on the recumbent bike. We will continue to monitor her progress in the program.    Expected Outcomes Short: Continue to increase levels on the Recumbent bike, and T4 nustep. Long: Continue exercise to improve strength and stamina. STG: work towards increasing workload at rehab and adding more home exercise. LTG: Continue to exercise to improve strength and stamina Short: Increase treadmill workloads. Long: Continue exercise to improve strength and stamina. Short: Continue to progressively increase treadmill, biostep, and recumbent bike workloads. Long: Continue exercise to improve strength and stamina.             Discharge Exercise Prescription (Final Exercise Prescription Changes):  Exercise Prescription Changes - 06/25/23 1500       Response to Exercise   Blood Pressure (Admit) 130/76    Blood Pressure (Exit) 110/64    Heart Rate (Admit) 83 bpm    Heart Rate (Exercise) 126 bpm    Heart Rate (Exit) 101 bpm    Oxygen Saturation (Admit) 96 %    Oxygen  Saturation (Exercise) 93 %  Oxygen Saturation (Exit) 92 %    Rating of Perceived Exertion (Exercise) 13    Perceived Dyspnea (Exercise) 2    Symptoms none    Duration Continue with 30 min of aerobic exercise without signs/symptoms of physical distress.    Intensity THRR unchanged      Progression   Progression Continue to progress workloads to maintain intensity without signs/symptoms of physical distress.    Average METs 2.31      Resistance Training   Training Prescription Yes    Weight 1 lb    Reps 10-15      Interval Training   Interval Training No      Treadmill   MPH 1.3    Grade 0    Minutes 15    METs 1.99      Recumbant Bike   Level 4    Watts 36    Minutes 15      NuStep   Level 3    Minutes 15    METs 2.3      Biostep-RELP   Level 5    Minutes 15    METs 3      Home Exercise Plan   Plans to continue exercise at Home (comment)   walking and looking into a gym membership   Frequency Add 2 additional days to program exercise sessions.    Initial Home Exercises Provided 05/27/23      Oxygen   Maintain Oxygen Saturation 88% or higher             Nutrition:  Target Goals: Understanding of nutrition guidelines, daily intake of sodium 1500mg , cholesterol 200mg , calories 30% from fat and 7% or less from saturated fats, daily to have 5 or more servings of fruits and vegetables.  Education: All About Nutrition: -Group instruction provided by verbal, written material, interactive activities, discussions, models, and posters to present general guidelines for heart healthy nutrition including fat, fiber, MyPlate, the role of sodium in heart healthy nutrition, utilization of the nutrition label, and utilization of this knowledge for meal planning. Follow up email sent as well. Written material given at graduation. Flowsheet Row Pulmonary Rehab from 06/26/2023 in [facility] Cardiac and Pulmonary Rehab  Date 05/08/23  Educator JG  Instruction Review Code 1-  Verbalizes Understanding       Biometrics:  Pre Biometrics - 04/02/23 1435       Pre Biometrics   Height 5' 5.8" (1.671 m)    Weight 187 lb 1.6 oz (84.9 kg)    Waist Circumference 41 inches    Hip Circumference 44.5 inches    Waist to Hip Ratio 0.92 %    BMI (Calculated) 30.39    Single Leg Stand 7.15 seconds              Nutrition Therapy Plan and Nutrition Goals:  Nutrition Therapy & Goals - 05/13/23 1401       Nutrition Therapy   Diet carb controlled, mediterranean    Protein (specify units) 90    Fiber 25 grams    Whole Grain Foods 3 servings    Saturated Fats 15 max. grams    Fruits and Vegetables 5 servings/day    Sodium 2 grams      Personal Nutrition Goals   Nutrition Goal Drink less Mt dew and at least 1 bottle of water per day    Personal Goal #2 Eat a smaller breakfast with 15-30gProtein and 30-60gCarbs at each meal.    Personal Goal #3 Include  more colorful produce, aim for 5-8 servings of fruits and veggies per day    Comments She doesn't drink any water, instead drinks mt dew. 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 Commended her on this accomplishment. Spoke with her about her A1C of 6.2%. Educated on carbs counting, target of 30-60g per meal. Went over different carb choices and how to improve the quality of carb choices. Encouraged more fruits and whole grains. Went over several small meal and snack ideas to help her be more consistent with her complex carb intake. Set goal to work on eating smaller breakfast with protein and complex carbs.      Intervention Plan   Intervention Prescribe, educate and counsel regarding individualized specific dietary modifications aiming towards targeted core components such as weight, hypertension, lipid management, diabetes, heart failure and other comorbidities.;Nutrition handout(s) given to patient.    Expected Outcomes Short Term Goal: Understand basic principles of dietary content, such as calories, fat, sodium, cholesterol and  nutrients.;Short Term Goal: A plan has been developed with personal nutrition goals set during dietitian appointment.;Long Term Goal: Adherence to prescribed nutrition plan.             Nutrition Assessments:  MEDIFICTS Score Key: >=70 Need to make dietary changes  40-70 Heart Healthy Diet <= 40 Therapeutic Level Cholesterol Diet  Flowsheet Row Pulmonary Rehab from 04/02/2023 in [facility] Cardiac and Pulmonary Rehab  Picture Your Plate Total Score on Admission 52      Picture Your Plate Scores: <41 Unhealthy dietary pattern with much room for improvement. 41-50 Dietary pattern unlikely to meet recommendations for good health and room for improvement. 51-60 More healthful dietary pattern, with some room for improvement.  >60 Healthy dietary pattern, although there may be some specific behaviors that could be improved.   Nutrition Goals Re-Evaluation:  Nutrition Goals Re-Evaluation     Row Name 04/29/23 1004 06/12/23 0940           Goals   Comment Patient has not met with RD yet. She is interested in meeting for a nutrition appointment. Appointment was scheduled for one on one RD apt on 05/06/2023 She is still working on reducing mt dew and replacing it with water. Refocused goal towards increasing water to at least 1 bottle per day. She reports she has been working on the goal of eating smaller breakfast.      Expected Outcome Short: meet with RD and set specific nutrition goals. Long: maintain heart healthy eating habits. STG: drink 1 bottle of water per day. LTG: maintain a hearth healthy lifestyle               Nutrition Goals Discharge (Final Nutrition Goals Re-Evaluation):  Nutrition Goals Re-Evaluation - 06/12/23 0940       Goals   Comment She is still working on reducing mt dew and replacing it with water. Refocused goal towards increasing water to at least 1 bottle per day. She reports she has been working on the goal of eating smaller breakfast.    Expected Outcome  STG: drink 1 bottle of water per day. LTG: maintain a hearth healthy lifestyle             Psychosocial: Target Goals: Acknowledge presence or absence of significant depression and/or stress, maximize coping skills, provide positive support system. Participant is able to verbalize types and ability to use techniques and skills needed for reducing stress and depression.   Education: Stress, Anxiety, and Depression - Group verbal and visual presentation to define  topics covered.  Reviews how body is impacted by stress, anxiety, and depression.  Also discusses healthy ways to reduce stress and to treat/manage anxiety and depression.  Written material given at graduation. Flowsheet Row Pulmonary Rehab from 06/26/2023 in [facility] Cardiac and Pulmonary Rehab  Date 06/19/23  Educator SB  Instruction Review Code 1- [employer] Understanding       Education: Sleep Hygiene -Provides group verbal and written instruction about how sleep can affect your health.  Define sleep hygiene, discuss sleep cycles and impact of sleep habits. Review good sleep hygiene tips.    Initial Review & Psychosocial Screening:  Initial Psych Review & Screening - 04/01/23 0948       Initial Review   Current issues with Current Sleep Concerns      Family Dynamics   Good Support System? Yes    Comments She can look to her room mate and best friends for support. Sometimes she does not sleep well do to possible sleep apnea. Genni goes to a [place] group therapy for recovery.      Barriers   Psychosocial barriers to participate in program The patient should benefit from training in stress management and relaxation.      Screening Interventions   Interventions To provide support and resources with identified psychosocial needs;Encouraged to exercise;Provide feedback about the scores to participant    Expected Outcomes Short Term goal: Utilizing psychosocial counselor, staff and physician to assist with identification of  specific Stressors or current issues interfering with healing process. Setting desired goal for each stressor or current issue identified.;Long Term Goal: Stressors or current issues are controlled or eliminated.;Short Term goal: Identification and review with participant of any Quality of Life or Depression concerns found by scoring the questionnaire.;Long Term goal: The participant improves quality of Life and PHQ9 Scores as seen by post scores and/or verbalization of changes             Quality of Life Scores:  Scores of 19 and below usually indicate a poorer quality of life in these areas.  A difference of  2-3 points is a clinically meaningful difference.  A difference of 2-3 points in the total score of the Quality of Life Index has been associated with significant improvement in overall quality of life, self-image, physical symptoms, and general health in studies assessing change in quality of life.  PHQ-9: Review Flowsheet       04/02/2023 07/07/2015 04/04/2015  Depression screen PHQ 2/9  Decreased Interest 0 0 0  Down, Depressed, Hopeless 0 0 0  PHQ - 2 Score 0 0 0  Altered sleeping 2 - -  Tired, decreased energy 1 - -  Change in appetite 0 - -  Feeling bad or failure about yourself  0 - -  Trouble concentrating 1 - -  Moving slowly or fidgety/restless 0 - -  Suicidal thoughts 0 - -  PHQ-9 Score 4 - -  Difficult doing work/chores Somewhat difficult - -   Interpretation of Total Score  Total Score Depression Severity:  1-4 = Minimal depression, 5-9 = Mild depression, 10-14 = Moderate depression, 15-19 = Moderately severe depression, 20-27 = Severe depression   Psychosocial Evaluation and Intervention:  Psychosocial Evaluation - 04/01/23 0951       Psychosocial Evaluation & Interventions   Interventions Relaxation education;Stress management education;Encouraged to exercise with the program and follow exercise prescription    Comments She can look to her room mate and  best friends for support. Sometimes she does  not sleep well do to possible sleep apnea. Tekeisha goes to a [place] group therapy for recovery.    Expected Outcomes Short: Start [facility] to help with mood. Long: Maintain a healthy mental state.    Continue Psychosocial Services  Follow up required by staff             Psychosocial Re-Evaluation:  Psychosocial Re-Evaluation     Row Name 04/29/23 1003 05/29/23 0933 06/12/23 0937         Psychosocial Re-Evaluation   Current issues with Current Sleep Concerns Current Sleep Concerns Current Stress Concerns     Comments Patient has recently completed a sleep study and has been diagnosed with sleep apnea. She is waiting for her CPAP machine to arrive. She will start using that as soon as she gets it. She does continue to say that her SOB is her main limiting factor with exercise and activity. Patient reports no issues with their current mental states, sleep, stress, depression or anxiety. Will follow up with patient in a few weeks for any changes. She reports no concerns with stress, anxiety, or depression at this time. Says she has a good support system. She is working with her medical team to imporve her sleep. says some medications have helped her sleep better the past week. She is also expecting a CPAP to be delivered in the next week or two. Overall, her sleep is improving.     Expected Outcomes Short: obtain CPAP machine and start using it to aid in sleep quality. Long: maintain good sleep patterns long term. Short: Continue to exercise regularly to support mental health and notify staff of any changes. Long: maintain mental health and well being through teaching of rehab or prescribed medications independently. STG: Continue to focus on sleeping well, and use CPAP once delivered. LTG: maintain positive outlook on health and daily life     Interventions Encouraged to attend Pulmonary Rehabilitation for the exercise Encouraged to attend Pulmonary  Rehabilitation for the exercise Encouraged to attend Pulmonary Rehabilitation for the exercise     Continue Psychosocial Services  Follow up required by staff Follow up required by staff Follow up required by staff              Psychosocial Discharge (Final Psychosocial Re-Evaluation):  Psychosocial Re-Evaluation - 06/12/23 0937       Psychosocial Re-Evaluation   Current issues with Current Stress Concerns    Comments She reports no concerns with stress, anxiety, or depression at this time. Says she has a good support system. She is working with her medical team to imporve her sleep. says some medications have helped her sleep better the past week. She is also expecting a CPAP to be delivered in the next week or two. Overall, her sleep is improving.    Expected Outcomes STG: Continue to focus on sleeping well, and use CPAP once delivered. LTG: maintain positive outlook on health and daily life    Interventions Encouraged to attend Pulmonary Rehabilitation for the exercise    Continue Psychosocial Services  Follow up required by staff             Education: Education Goals: Education classes will be provided on a weekly basis, covering required topics. Participant will state understanding/return demonstration of topics presented.  Learning Barriers/Preferences:  Learning Barriers/Preferences - 04/01/23 0946       Learning Barriers/Preferences   Learning Barriers None    Learning Preferences None  General Pulmonary Education Topics:  Infection Prevention: - Provides verbal and written material to individual with discussion of infection control including proper hand washing and proper equipment cleaning during exercise session. Flowsheet Row Pulmonary Rehab from 06/26/2023 in [facility] Cardiac and Pulmonary Rehab  Date 04/01/23  Educator [facility]  Instruction Review Code 1- Verbalizes Understanding       Falls Prevention: - Provides verbal and written material to  individual with discussion of falls prevention and safety. Flowsheet Row Pulmonary Rehab from 06/26/2023 in [facility] Cardiac and Pulmonary Rehab  Date 04/01/23  Educator [facility]  Instruction Review Code 1- Verbalizes Understanding       Chronic Lung Disease Review: - Group verbal instruction with posters, models, PowerPoint presentations and videos,  to review new updates, new respiratory medications, new advancements in procedures and treatments. Providing information on websites and "800" numbers for continued self-education. Includes information about supplement oxygen, available portable oxygen systems, continuous and intermittent flow rates, oxygen safety, concentrators, and Medicare reimbursement for oxygen. Explanation of Pulmonary Drugs, including class, frequency, complications, importance of spacers, rinsing mouth after steroid MDI's, and proper cleaning methods for nebulizers. Review of basic lung anatomy and physiology related to function, structure, and complications of lung disease. Review of risk factors. Discussion about methods for diagnosing sleep apnea and types of masks and machines for OSA. Includes a review of the use of types of environmental controls: home humidity, furnaces, filters, dust mite/pet prevention, HEPA vacuums. Discussion about weather changes, air quality and the benefits of nasal washing. Instruction on Warning signs, infection symptoms, calling MD promptly, preventive modes, and value of vaccinations. Review of effective airway clearance, coughing and/or vibration techniques. Emphasizing that all should Create an Action Plan. Written material given at graduation. Flowsheet Row Pulmonary Rehab from 06/26/2023 in [facility] Cardiac and Pulmonary Rehab  Date 06/12/23  Educator [facility]  Instruction Review Code 1- Verbalizes Understanding       AED/CPR: - Group verbal and written instruction with the use of models to demonstrate the basic use of the AED with the basic ABC's of  resuscitation.    Anatomy and Cardiac Procedures: - Group verbal and visual presentation and models provide information about basic cardiac anatomy and function. Reviews the testing methods done to diagnose heart disease and the outcomes of the test results. Describes the treatment choices: Medical Management, Angioplasty, or Coronary Bypass Surgery for treating various heart conditions including Myocardial Infarction, Angina, Valve Disease, and Cardiac Arrhythmias.  Written material given at graduation.   Medication Safety: - Group verbal and visual instruction to review commonly prescribed medications for heart and lung disease. Reviews the medication, class of the drug, and side effects. Includes the steps to properly store meds and maintain the prescription regimen.  Written material given at graduation. Flowsheet Row Pulmonary Rehab from 06/26/2023 in [facility] Cardiac and Pulmonary Rehab  Date 05/29/23  Educator SB  Instruction Review Code 1- Verbalizes Understanding       Other: -Provides group and verbal instruction on various topics (see comments)   Knowledge Questionnaire Score:    Core Components/Risk Factors/Patient Goals at Admission:  Personal Goals and Risk Factors at Admission - 04/01/23 0946       Core Components/Risk Factors/Patient Goals on Admission    Weight Management Yes    Intervention Weight Management: Develop a combined nutrition and exercise program designed to reach desired caloric intake, while maintaining appropriate intake of nutrient and fiber, sodium and fats, and appropriate energy expenditure required for the weight goal.;Weight Management: Provide  education and appropriate resources to help participant work on and attain dietary goals.;Weight Management/Obesity: Establish reasonable short term and long term weight goals.    Expected Outcomes Long Term: Adherence to nutrition and physical activity/exercise program aimed toward attainment of established  weight goal;Short Term: Continue to assess and modify interventions until short term weight is achieved;Weight Loss: Understanding of general recommendations for a balanced deficit meal plan, which promotes 1-2 lb weight loss per week and includes a negative energy balance of [phone] kcal/d;Understanding recommendations for meals to include 15-35% energy as protein, 25-35% energy from fat, 35-60% energy from carbohydrates, less than 200mg  of dietary cholesterol, 20-35 gm of total fiber daily;Understanding of distribution of calorie intake throughout the day with the consumption of 4-5 meals/snacks    Improve shortness of breath with ADL's Yes    Intervention Provide education, individualized exercise plan and daily activity instruction to help decrease symptoms of SOB with activities of daily living.    Expected Outcomes Short Term: Improve cardiorespiratory fitness to achieve a reduction of symptoms when performing ADLs;Long Term: Be able to perform more ADLs without symptoms or delay the onset of symptoms    Diabetes Yes    Intervention Provide education about signs/symptoms and action to take for hypo/hyperglycemia.;Provide education about proper nutrition, including hydration, and aerobic/resistive exercise prescription along with prescribed medications to achieve blood glucose in normal ranges: Fasting glucose 65-99 mg/dL    Expected Outcomes Short Term: Participant verbalizes understanding of the signs/symptoms and immediate care of hyper/hypoglycemia, proper foot care and importance of medication, aerobic/resistive exercise and nutrition plan for blood glucose control.;Long Term: Attainment of HbA1C < 7%.    Hypertension Yes    Intervention Provide education on lifestyle modifcations including regular physical activity/exercise, weight management, moderate sodium restriction and increased consumption of fresh fruit, vegetables, and low fat dairy, alcohol moderation, and smoking cessation.;Monitor  prescription use compliance.    Expected Outcomes Short Term: Continued assessment and intervention until BP is < 140/34mm HG in hypertensive participants. < 130/33mm HG in hypertensive participants with diabetes, heart failure or chronic kidney disease.;Long Term: Maintenance of blood pressure at goal levels.    Lipids Yes    Intervention Provide education and support for participant on nutrition & aerobic/resistive exercise along with prescribed medications to achieve LDL 70mg , HDL >40mg .    Expected Outcomes Short Term: Participant states understanding of desired cholesterol values and is compliant with medications prescribed. Participant is following exercise prescription and nutrition guidelines.;Long Term: Cholesterol controlled with medications as prescribed, with individualized exercise RX and with personalized nutrition plan. Value goals: LDL < 70mg , HDL > 40 mg.             Education:Diabetes - Individual verbal and written instruction to review signs/symptoms of diabetes, desired ranges of glucose level fasting, after meals and with exercise. Acknowledge that pre and post exercise glucose checks will be done for 3 sessions at entry of program. Flowsheet Row Pulmonary Rehab from 06/26/2023 in [facility] Cardiac and Pulmonary Rehab  Date 04/01/23  Educator [facility]  Instruction Review Code 1- Verbalizes Understanding       Know Your Numbers and Heart Failure: - Group verbal and visual instruction to discuss disease risk factors for cardiac and pulmonary disease and treatment options.  Reviews associated critical values for Overweight/Obesity, Hypertension, Cholesterol, and Diabetes.  Discusses basics of heart failure: signs/symptoms and treatments.  Introduces Heart Failure Zone chart for action plan for heart failure.  Written material given at graduation. Flowsheet Row Pulmonary Rehab  from 06/26/2023 in [facility] Cardiac and Pulmonary Rehab  Date 06/05/23  Educator [facility]  Instruction Review Code 1-  Verbalizes Understanding       Core Components/Risk Factors/Patient Goals Review:   Goals and Risk Factor Review     Row Name 04/29/23 [phone] 05/29/23 0934 06/12/23 0942         Core Components/Risk Factors/Patient Goals Review   Personal Goals Review Diabetes;Hypertension Improve shortness of breath with ADL's Improve shortness of breath with ADL's     Review Patient reported that her doctor had prescribed medication to help control blood sugars but she has not been taking it. She has been trying to manage her blood sugars with nutrion. She has not told her doctor this and she was encouraged to call her doctor inform them that she has not been taking her DM meds and to discuss options with her care plan. She does have a blood pressure cuff at home but does not consistently use it. She was encouraged to start checking her blood pressure at home. She does take her BP meds. Spoke to patient about their shortness of breath and what they can do to improve. Patient has been informed of breathing techniques when starting the program. Patient is informed to tell staff if they have had any med changes and that certain meds they are taking or not taking can be causing shortness of breath. Patient reports she is doing much better with ADLs and says her shortness of breath is much improved. Reviewed breathing techniques and encouraged her to continue to attend [facility] rehab.     Expected Outcomes Short: call doctor to discuss diabetes medicaiton concerns. Start checking BP at home. Long: work with medical team to control cardiac risk factors Short: Attend [facility] regularly to improve shortness of breath with ADL's. Long: maintain independence with ADL's STG: attend [facility], increase workload as able. LTG: maintain independence with ADLs              Core Components/Risk Factors/Patient Goals at Discharge (Final Review):   Goals and Risk Factor Review - 06/12/23 0942       Core Components/Risk  Factors/Patient Goals Review   Personal Goals Review Improve shortness of breath with ADL's    Review Patient reports she is doing much better with ADLs and says her shortness of breath is much improved. Reviewed breathing techniques and encouraged her to continue to attend [facility] rehab.    Expected Outcomes STG: attend [facility], increase workload as able. LTG: maintain independence with ADLs             ITP Comments:  ITP Comments     Row Name 04/01/23 0945 04/02/23 1426 04/08/23 0936 04/10/23 0937 05/08/23 1245   ITP Comments Virtual Visit completed. Patient informed on EP and RD appointment and 6 Minute walk test. Patient also informed of patient health questionnaires on My Chart. Patient Verbalizes understanding. Visit diagnosis can be found in [facility] 01/13/[facility]23. Completed and gym orientation. Initial ITP created and sent for review to Dr. Jinny Sanders, Medical Director. First full day of exercise!  Patient was oriented to gym and equipment including functions, settings, policies, and procedures.  Patient's individual exercise prescription and treatment plan were reviewed.  All starting workloads were established based on the results of the 6 minute walk test done at initial orientation visit.  The plan for exercise progression was also introduced and progression will be customized based on patient's performance and goals. 30 Day review completed. Medical Director ITP  review done, changes made as directed, and signed approval by Medical Director.    new to program 30 Day review completed. Medical Director ITP review done, changes made as directed, and signed approval by Medical Director.    Row Name 06/05/23 0725 07/03/23 0748         ITP Comments 30 Day review completed. Medical Director ITP review done, changes made as directed, and signed approval by Medical Director. 30 Day review completed. Medical Director ITP review done, changes made as directed, and signed approval by Medical  Director.               Comments:

## 2023-07-05 ENCOUNTER — Encounter: Admitting: *Deleted

## 2023-07-05 DIAGNOSIS — J449 Chronic obstructive pulmonary disease, unspecified: Secondary | ICD-10-CM

## 2023-07-05 NOTE — Progress Notes (Signed)
 Daily Session Note  Patient Details  Name: Jenna Tyler MRN: [id number] Date of Birth: Sep 21, 1965 Referring Provider:   Flowsheet Row Pulmonary Rehab from 04/02/2023 in [facility] Cardiac and Pulmonary Rehab  Referring Provider Dr. Alphonzo Dublin       Encounter Date: 07/05/2023  Check In:  Session Check In - 07/05/23 0927       Check-In   Supervising physician immediately available to respond to emergencies See telemetry face sheet for immediately available ER MD    Location [facility]-Cardiac & Pulmonary Rehab    Staff Present Cora Collum, RN, BSN, CCRP;Noah Tickle, BS, Exercise Physiologist;Joseph Hood RCP,RRT,BSRT    Virtual Visit No    Medication changes reported     No    Fall or balance concerns reported    No    Warm-up and Cool-down Performed on first and last piece of equipment    Resistance Training Performed Yes    VAD Patient? No    PAD/SET Patient? No      Pain Assessment   Currently in Pain? No/denies                Social History   Tobacco Use  Smoking Status Former   Current packs/day: 0.00   Average packs/day: 1 pack/day for 15.0 years (15.0 ttl pk-yrs)   Types: Cigarettes   Start date: 12/29/1982   Quit date: 12/28/1997   Years since quitting: 25.5  Smokeless Tobacco Never    Goals Met:  Proper associated with RPD/PD & O2 Sat Independence with exercise equipment Exercise tolerated well No report of concerns or symptoms today  Goals Unmet:  Not Applicable  Comments: Pt able to follow exercise prescription today without complaint.  Will continue to monitor for progression.    Dr. Bethann Punches is Medical Director for [facility] Cardiac Rehabilitation.  Dr. Vida Rigger is Medical Director for [facility] Pulmonary Rehabilitation.

## 2023-07-08 ENCOUNTER — Encounter

## 2023-07-10 ENCOUNTER — Encounter: Admitting: *Deleted

## 2023-07-10 DIAGNOSIS — J449 Chronic obstructive pulmonary disease, unspecified: Secondary | ICD-10-CM | POA: Diagnosis not present

## 2023-07-10 NOTE — Progress Notes (Signed)
 Daily Session Note  Patient Details  Name: Jenna Tyler MRN: [id number] Date of Birth: Dec 07, 1965 Referring Provider:   Flowsheet Row Pulmonary Rehab from 04/02/2023 in [facility] Cardiac and Pulmonary Rehab  Referring Provider Dr. Alphonzo Dublin       Encounter Date: 07/10/2023  Check In:  Session Check In - 07/10/23 0938       Check-In   Supervising physician immediately available to respond to emergencies See telemetry face sheet for immediately available ER MD    Location [facility]-Cardiac & Pulmonary Rehab    Staff Present Cora Collum, RN, BSN, CCRP;Joseph Hood RCP,RRT,BSRT;Maxon [place] BS, Exercise Physiologist;Noah Tickle, BS, Exercise Physiologist    Virtual Visit No    Medication changes reported     No    Fall or balance concerns reported    No    Warm-up and Cool-down Performed on first and last piece of equipment    Resistance Training Performed Yes    VAD Patient? No    PAD/SET Patient? No      Pain Assessment   Currently in Pain? No/denies                Social History   Tobacco Use  Smoking Status Former   Current packs/day: 0.00   Average packs/day: 1 pack/day for 15.0 years (15.0 ttl pk-yrs)   Types: Cigarettes   Start date: 12/29/1982   Quit date: 12/28/1997   Years since quitting: 25.5  Smokeless Tobacco Never    Goals Met:  Proper associated with RPD/PD & O2 Sat Independence with exercise equipment Exercise tolerated well No report of concerns or symptoms today  Goals Unmet:  Not Applicable  Comments: Pt able to follow exercise prescription today without complaint.  Will continue to monitor for progression.    Dr. Bethann Punches is Medical Director for [facility] Cardiac Rehabilitation.  Dr. Vida Rigger is Medical Director for [facility] Pulmonary Rehabilitation.

## 2023-07-12 ENCOUNTER — Encounter: Admitting: *Deleted

## 2023-07-12 DIAGNOSIS — J449 Chronic obstructive pulmonary disease, unspecified: Secondary | ICD-10-CM | POA: Diagnosis not present

## 2023-07-12 NOTE — Progress Notes (Signed)
 Daily Session Note  Patient Details  Name: Jenna Tyler MRN: [id number] Date of Birth: [birth year] Referring Provider:   Flowsheet Row Pulmonary Rehab from 04/02/2023 in [facility] Cardiac and Pulmonary Rehab  Referring Provider Dr. Alphonzo Dublin       Encounter Date: 07/12/2023  Check In:  Session Check In - 07/12/23 0936       Check-In   Supervising physician immediately available to respond to emergencies See telemetry face sheet for immediately available ER MD    Location [facility]-Cardiac & Pulmonary Rehab    Staff Present Rory Percy, MS, Exercise Physiologist;Levie Owensby, RN, BSN, CCRP;Joseph Hood RCP,RRT,BSRT    Virtual Visit No    Medication changes reported     No    Fall or balance concerns reported    No    Warm-up and Cool-down Performed on first and last piece of equipment    Resistance Training Performed Yes    VAD Patient? No    PAD/SET Patient? No      Pain Assessment   Currently in Pain? No/denies                Social History   Tobacco Use  Smoking Status Former   Current packs/day: 0.00   Average packs/day: 1 pack/day for 15.0 years (15.0 ttl pk-yrs)   Types: Cigarettes   Start date: 12/29/1982   Quit date: 12/28/1997   Years since quitting: 25.5  Smokeless Tobacco Never    Goals Met:  Proper associated with RPD/PD & O2 Sat Independence with exercise equipment Exercise tolerated well No report of concerns or symptoms today  Goals Unmet:  Not Applicable  Comments: Pt able to follow exercise prescription today without complaint.  Will continue to monitor for progression.    Dr. Bethann Punches is Medical Director for [facility] Cardiac Rehabilitation.  Dr. Vida Rigger is Medical Director for [facility] Pulmonary Rehabilitation.

## 2023-07-15 ENCOUNTER — Encounter: Admitting: *Deleted

## 2023-07-15 VITALS — Ht 65.8 in | Wt 190.8 lb

## 2023-07-15 DIAGNOSIS — J449 Chronic obstructive pulmonary disease, unspecified: Secondary | ICD-10-CM | POA: Diagnosis not present

## 2023-07-15 NOTE — Progress Notes (Signed)
 Discharge Note for  Jenna Tyler     18-Apr-1966         Jenna Tyler graduated today from  rehab with 28 sessions completed.  Details of the patient's exercise prescription and what She needs to do in order to continue the prescription and progress were discussed with patient.  Patient was given a copy of prescription and goals.  Patient verbalized understanding. [name] plans to continue to exercise by going to [facility] with her daughter.   6 Minute Walk     Row Name 04/02/23 1427 07/15/23 0944       6 Minute Walk   Phase Initial Discharge    Distance 1180 feet 1290 feet    Distance % Change -- 9.32 %    Distance Feet Change -- 110 ft    Walk Time 6 minutes 6 minutes    # of Rest Breaks 0 0    MPH 2.2 2.44    METS 3.23 3.18    RPE 17 9    Perceived Dyspnea  3 2    VO2 Peak 11.3 11.13    Symptoms No No    Resting HR 110 bpm 73 bpm    Resting BP 92/64 110/70    Resting Oxygen Saturation  95 % 98 %    Exercise Oxygen Saturation  during 6 min walk 89 % 94 %    Max Ex. HR 126 bpm 86 bpm    Max Ex. BP 106/64 122/80    2 Minute Post BP 90/64 110/80      Interval HR   1 Minute HR 116 77    2 Minute HR 126 84    3 Minute HR 125 86    4 Minute HR 121 84    5 Minute HR 125 85    6 Minute HR 126 86    2 Minute Post HR 118 71    Interval Heart Rate? Yes Yes      Interval Oxygen   Interval Oxygen? Yes Yes    Baseline Oxygen Saturation % 95 % 98 %    1 Minute Oxygen Saturation % 95 % 97 %    1 Minute Liters of Oxygen -- 0 L    2 Minute Oxygen Saturation % 93 % 95 %    2 Minute Liters of Oxygen -- 0 L    3 Minute Oxygen Saturation % 91 % 95 %    3 Minute Liters of Oxygen -- 0 L    4 Minute Oxygen Saturation % 89 % 96 %    4 Minute Liters of Oxygen -- 0 L    5 Minute Oxygen Saturation % 97 % 94 %    5 Minute Liters of Oxygen -- 0 L    6 Minute Oxygen Saturation % 98 % 96 %    6 Minute Liters of Oxygen -- 0 L    2 Minute Post Oxygen Saturation % 99 % 98 %

## 2023-07-15 NOTE — Patient Instructions (Addendum)
 Discharge Patient Instructions  Patient Details  Name: Jenna Tyler MRN: [id number] Date of Birth: 09/13/1965 Referring Provider:  Alm Bustard, NP   Number of Visits: 25  Reason for Discharge:  Early Exit:  Surgery  Smoking History:  Social History   Tobacco Use  Smoking Status Former   Current packs/day: 0.00   Average packs/day: 1 pack/day for 15.0 years (15.0 ttl pk-yrs)   Types: Cigarettes   Start date: 12/29/1982   Quit date: 12/28/1997   Years since quitting: 25.5  Smokeless Tobacco Never    Diagnosis:  Chronic obstructive pulmonary disease, unspecified COPD type (HCC)  Initial Exercise Prescription:  Initial Exercise Prescription - 04/02/23 1400       Date of Initial Exercise RX and Referring Provider   Date 04/02/23    Referring Provider Dr. Alphonzo Dublin      Oxygen   Maintain Oxygen Saturation 88% or higher      Treadmill   MPH 2.2    Grade 0    Minutes 15    METs 2.68      Recumbant Bike   Level 2    RPM 50    Watts 25    Minutes 15    METs 3.23      NuStep   Level 2    SPM 80    Minutes 15    METs 3.23      Arm Ergometer   Level 1    Watts 25    Minutes 15    METs 3.23      T5 Nustep   Level 2    SPM 80    Minutes 15    METs 3.23      Biostep-RELP   Level 2    SPM 50    Minutes 15    METs 3.23      Track   Laps 41    Minutes 15    METs 3.23      Prescription Details   Duration Progress to 30 minutes of continuous aerobic without signs/symptoms of physical distress      Intensity   THRR 40-80% of Max Heartrate 131-152    Ratings of Perceived Exertion 11-13    Perceived Dyspnea 0-4      Progression   Progression Continue progressive overload as per policy without signs/symptoms or physical distress.      Resistance Training   Training Prescription Yes    Weight 3 lb    Reps 10-15             Discharge Exercise Prescription (Final Exercise Prescription Changes):  Exercise Prescription Changes  - 07/08/23 1400       Response to Exercise   Blood Pressure (Admit) 118/62    Blood Pressure (Exit) 106/60    Heart Rate (Admit) 76 bpm    Heart Rate (Exercise) 112 bpm    Heart Rate (Exit) 70 bpm    Oxygen Saturation (Admit) 92 %    Oxygen Saturation (Exercise) 93 %    Oxygen Saturation (Exit) 93 %    Rating of Perceived Exertion (Exercise) 12    Perceived Dyspnea (Exercise) 2    Symptoms none    Duration Continue with 30 min of aerobic exercise without signs/symptoms of physical distress.    Intensity THRR unchanged      Progression   Progression Continue to progress workloads to maintain intensity without signs/symptoms of physical distress.    Average METs 2.88  Resistance Training   Training Prescription Yes    Weight 1 lb    Reps 10-15      Interval Training   Interval Training No      Treadmill   MPH 1.6    Grade 0    Minutes 15    METs 2.23      Recumbant Bike   Level 4    Watts 30    Minutes 15    METs 3.11      NuStep   Level 5    Minutes 15      T5 Nustep   Level 4   T5 and T6   Minutes 15    METs 2      Biostep-RELP   Level 5    Minutes 15      Home Exercise Plan   Plans to continue exercise at Home (comment)   walking and looking into a gym membership   Frequency Add 2 additional days to program exercise sessions.    Initial Home Exercises Provided 05/27/23      Oxygen   Maintain Oxygen Saturation 88% or higher             Functional Capacity:  6 Minute Walk     Row Name 04/02/23 1427 07/15/23 0944       6 Minute Walk   Phase Initial Discharge    Distance 1180 feet 1290 feet    Distance % Change -- 9.32 %    Distance Feet Change -- 110 ft    Walk Time 6 minutes 6 minutes    # of Rest Breaks 0 0    MPH 2.2 2.44    METS 3.23 3.18    RPE 17 9    Perceived Dyspnea  3 2    VO2 Peak 11.3 11.13    Symptoms No No    Resting HR 110 bpm 73 bpm    Resting BP 92/64 110/70    Resting Oxygen Saturation  95 % 98 %     Exercise Oxygen Saturation  during 6 min walk 89 % 94 %    Max Ex. HR 126 bpm 86 bpm    Max Ex. BP 106/64 122/80    2 Minute Post BP 90/64 110/80      Interval HR   1 Minute HR 116 77    2 Minute HR 126 84    3 Minute HR 125 86    4 Minute HR 121 84    5 Minute HR 125 85    6 Minute HR 126 86    2 Minute Post HR 118 71    Interval Heart Rate? Yes Yes      Interval Oxygen   Interval Oxygen? Yes Yes    Baseline Oxygen Saturation % 95 % 98 %    1 Minute Oxygen Saturation % 95 % 97 %    1 Minute Liters of Oxygen -- 0 L    2 Minute Oxygen Saturation % 93 % 95 %    2 Minute Liters of Oxygen -- 0 L    3 Minute Oxygen Saturation % 91 % 95 %    3 Minute Liters of Oxygen -- 0 L    4 Minute Oxygen Saturation % 89 % 96 %    4 Minute Liters of Oxygen -- 0 L    5 Minute Oxygen Saturation % 97 % 94 %    5 Minute Liters of Oxygen -- 0 L  6 Minute Oxygen Saturation % 98 % 96 %    6 Minute Liters of Oxygen -- 0 L    2 Minute Post Oxygen Saturation % 99 % 98 %            Nutrition & Weight - Outcomes:  Pre Biometrics - 04/02/23 1435       Pre Biometrics   Height 5' 5.8" (1.671 m)    Weight 187 lb 1.6 oz (84.9 kg)    Waist Circumference 41 inches    Hip Circumference 44.5 inches    Waist to Hip Ratio 0.92 %    BMI (Calculated) 30.39    Single Leg Stand 7.15 seconds             Post Biometrics - 07/15/23 0947        Post  Biometrics   Height 5' 5.8" (1.671 m)    Weight 190 lb 12.8 oz (86.5 kg)    Waist Circumference 42 inches    Hip Circumference 44 inches    Waist to Hip Ratio 0.95 %    BMI (Calculated) 31    Single Leg Stand 7.8 seconds             Nutrition:  Nutrition Therapy & Goals - 05/13/23 1401       Nutrition Therapy   Diet carb controlled, mediterranean    Protein (specify units) 90    Fiber 25 grams    Whole Grain Foods 3 servings    Saturated Fats 15 max. grams    Fruits and Vegetables 5 servings/day    Sodium 2 grams      Personal  Nutrition Goals   Nutrition Goal Drink less Mt dew and at least 1 bottle of water per day    Personal Goal #2 Eat a smaller breakfast with 15-30gProtein and 30-60gCarbs at each meal.    Personal Goal #3 Include more colorful produce, aim for 5-8 servings of fruits and veggies per day    Comments She doesn't drink any water, instead drinks mt dew. 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 Commended her on this accomplishment. Spoke with her about her A1C of 6.2%. Educated on carbs counting, target of 30-60g per meal. Went over different carb choices and how to improve the quality of carb choices. Encouraged more fruits and whole grains. Went over several small meal and snack ideas to help her be more consistent with her complex carb intake. Set goal to work on eating smaller breakfast with protein and complex carbs.      Intervention Plan   Intervention Prescribe, educate and counsel regarding individualized specific dietary modifications aiming towards targeted core components such as weight, hypertension, lipid management, diabetes, heart failure and other comorbidities.;Nutrition handout(s) given to patient.    Expected Outcomes Short Term Goal: Understand basic principles of dietary content, such as calories, fat, sodium, cholesterol and nutrients.;Short Term Goal: A plan has been developed with personal nutrition goals set during dietitian appointment.;Long Term Goal: Adherence to prescribed nutrition plan.

## 2023-07-15 NOTE — Progress Notes (Signed)
 Pulmonary Individual Treatment Plan  Patient Details  Name: Jenna Tyler MRN: [id number] Date of Birth: 05/05/1965 Referring Provider:   Flowsheet Row Pulmonary Rehab from 04/02/2023 in [facility] Cardiac and Pulmonary Rehab  Referring Provider Dr. Alphonzo Dublin       Initial Encounter Date:  Flowsheet Row Pulmonary Rehab from 04/02/2023 in [facility] Cardiac and Pulmonary Rehab  Date 04/02/23       Visit Diagnosis: Chronic obstructive pulmonary disease, unspecified COPD type (HCC)  Patient's Home Medications on Admission:  Current Outpatient Medications:    albuterol (PROVENTIL) (2.5 MG/3ML) 0.083% nebulizer solution, Inhale into the lungs., Disp: , Rfl:    albuterol (VENTOLIN HFA) 108 (90 Base) MCG/ACT inhaler, Inhale 1-2 puffs into the lungs every 4 (four) hours as needed. (Patient not taking: Reported on 04/01/2023), Disp: , Rfl:    Blood Glucose Monitoring Suppl (GLUCOCOM BLOOD GLUCOSE MONITOR) DEVI, 1 each as directed, Disp: , Rfl:    budesonide (PULMICORT) 0.25 MG/2ML nebulizer solution, Inhale into the lungs., Disp: , Rfl:    butalbital-acetaminophen-caffeine (FIORICET) 50-325-40 MG tablet, Take 1 tablet by mouth every 4 (four) hours as needed for headache., Disp: 14 tablet, Rfl: 0   chlorpheniramine-HYDROcodone (TUSSIONEX) 10-8 MG/5ML, Take 5 mLs by mouth every 12 (twelve) hours., Disp: 70 mL, Rfl: 0   Cholecalciferol 50 MCG (2000 UT) TABS, Take by mouth. (Patient not taking: Reported on 04/01/2023), Disp: , Rfl:    Cysteamine Bitartrate (PROCYSBI) 300 MG PACK, Use 1 each once daily Use as instructed., Disp: , Rfl:    dicyclomine (BENTYL) 20 MG tablet, Take 1 tablet (20 mg total) by mouth every 8 (eight) hours as needed., Disp: 15 tablet, Rfl: 0   doxycycline (VIBRAMYCIN) 100 MG capsule, SMARTSIG:1.0 Capsule(s) By Mouth Twice Daily, Disp: , Rfl:    gabapentin (NEURONTIN) 800 MG tablet, Take 800 mg by mouth 2 (two) times daily., Disp: , Rfl:    glucose blood (ACCU-CHEK GUIDE  TEST) test strip, 1 each (1 strip total) once daily Use as instructed., Disp: , Rfl:    HYDROcodone-acetaminophen (NORCO/VICODIN) 5-325 MG tablet, Take 1 tablet by mouth every 4 (four) hours as needed., Disp: 15 tablet, Rfl: 0   hydrOXYzine (ATARAX) 25 MG tablet, Take by mouth. (Patient not taking: Reported on 04/01/2023), Disp: , Rfl:    ibuprofen (ADVIL) 200 MG tablet, Take by mouth., Disp: , Rfl:    levothyroxine (SYNTHROID) 100 MCG tablet, Take 100 mcg by mouth daily., Disp: , Rfl:    meclizine (ANTIVERT) 25 MG tablet, Take 25mg  1-3 times daily as needed (Patient not taking: Reported on 04/01/2023), Disp: , Rfl:    metFORMIN (GLUCOPHAGE) 500 MG tablet, Take 500 mg by mouth 2 (two) times daily. (Patient not taking: Reported on 04/01/2023), Disp: , Rfl:    methocarbamol (ROBAXIN) 750 MG tablet, Take 750 mg by mouth 2 (two) times daily. (Patient not taking: Reported on 04/01/2023), Disp: , Rfl:    metoprolol tartrate (LOPRESSOR) 25 MG tablet, Take 25 mg by mouth every morning., Disp: , Rfl:    Multiple Vitamin (MULTIVITAMIN WITH MINERALS) TABS tablet, Take 1 tablet by mouth daily. (Patient not taking: Reported on 04/01/2023), Disp: , Rfl:    nortriptyline (PAMELOR) 10 MG capsule, , Disp: , Rfl:    omeprazole (PRILOSEC) 40 MG capsule, Take by mouth., Disp: , Rfl:    ondansetron (ZOFRAN) 4 MG tablet, Take 8 mg by mouth 2 (two) times daily., Disp: , Rfl:    ondansetron (ZOFRAN-ODT) 4 MG disintegrating tablet, Take 1 tablet (  4 mg total) by mouth every 6 (six) hours as needed for nausea or vomiting. (Patient not taking: Reported on 04/01/2023), Disp: 20 tablet, Rfl: 0   polyethylene glycol (MIRALAX) 17 g packet, Take 17 g by mouth daily. (Patient not taking: Reported on 04/01/2023), Disp: 30 each, Rfl: 0   rOPINIRole (REQUIP) 0.5 MG tablet, Take 1 mg by mouth 2 (two) times daily., Disp: , Rfl:    SYMBICORT 160-4.5 MCG/ACT inhaler, Inhale 2 puffs into the lungs 2 (two) times daily., Disp: , Rfl:   Past Medical  History: Past Medical History:  Diagnosis Date   AKI (acute kidney injury) (HCC) 07/12/2017   Asthma    COPD (chronic obstructive pulmonary disease) (HCC)    COPD exacerbation (HCC) 12/29/2016   COPD with acute exacerbation (HCC) 10/02/2022   DM (diabetes mellitus), type 2 (HCC)    Dyspnea    Fibromyalgia    Guillain Barr syndrome (HCC)    Headache    Hyperlipidemia    Hypertension    Hypothyroidism    Neurofibromatosis, peripheral, NF1 (HCC) 2017   Pancreatitis    PONV (postoperative nausea and vomiting)    Pulmonary nodule    RLS (restless legs syndrome)    Sleep apnea    never went to pick up her cpap   Tachycardia    Tracheomalacia     Tobacco Use: Social History   Tobacco Use  Smoking Status Former   Current packs/day: 0.00   Average packs/day: 1 pack/day for 15.0 years (15.0 ttl pk-yrs)   Types: Cigarettes   Start date: 12/29/1982   Quit date: 12/28/1997   Years since quitting: 25.5  Smokeless Tobacco Never    Labs: Review Flowsheet  More data may exist      Latest Ref Rng & Units 06/27/2015 12/29/2016 07/09/2019 07/10/2019 10/04/2021  Labs for ITP Cardiac and Pulmonary Rehab  Cholestrol 0 - 200 mg/dL - 161  - - -  LDL (calc) 0 - 99 mg/dL - 096  - - -  HDL-C >04 mg/dL - 47  - - -  Trlycerides <150 mg/dL - 540  - - -  Hemoglobin A1c 4.8 - 5.6 % 5.9  5.7  - 6.3  6.2   PH, Arterial 7.350 - 7.450 - - 7.39  - -  PCO2 arterial 32.0 - 48.0 mmHg - - 30  - -  Bicarbonate 20.0 - 28.0 mmol/L - - 18.2  - -  Acid-base deficit 0.0 - 2.0 mmol/L - - 5.6  - -  O2 Saturation % - - 97.7  - -     Pulmonary Assessment Scores:  Pulmonary Assessment Scores     Row Name 07/15/23 1631         ADL UCSD   ADL Phase Exit     SOB Score total 47     Rest 0     Walk 3     Stairs 4     Bath 1     Dress 3     Shop 0       CAT Score   CAT Score 21       mMRC Score   mMRC Score 1              UCSD: Self-administered rating of dyspnea associated with activities of  daily living (ADLs) 6-point scale (0 = "not at all" to 5 = "maximal or unable to do because of breathlessness")  Scoring Scores range from 0 to 120.  Minimally important  difference is 5 units  CAT: CAT can identify the health impairment of COPD patients and is better correlated with disease progression.  CAT has a scoring range of zero to 40. The CAT score is classified into four groups of low (less than 10), medium (10 - 20), high (21-30) and very high (31-40) based on the impact level of disease on health status. A CAT score over 10 suggests significant symptoms.  A worsening CAT score could be explained by an exacerbation, poor medication adherence, poor inhaler technique, or progression of COPD or comorbid conditions.  CAT MCID is 2 points  mMRC: mMRC (Modified Medical Research Council) Dyspnea Scale is used to assess the degree of baseline functional disability in patients of respiratory disease due to dyspnea. No minimal important difference is established. A decrease in score of 1 point or greater is considered a positive change.   Pulmonary Function Assessment:  Pulmonary Function Assessment - 04/01/23 0946       Breath   Shortness of Breath Yes;Limiting activity             Exercise Target Goals: Exercise Program Goal: Individual exercise prescription set using results from initial 6 min walk test and THRR while considering  patient's activity barriers and safety.   Exercise Prescription Goal: Initial exercise prescription builds to 30-45 minutes a day of aerobic activity, 2-3 days per week.  Home exercise guidelines will be given to patient during program as part of exercise prescription that the participant will acknowledge.  Education: Aerobic Exercise: - Group verbal and visual presentation on the components of exercise prescription. Introduces F.I.T.T principle from ACSM for exercise prescriptions.  Reviews F.I.T.T. principles of aerobic exercise including progression.  Written material given at graduation. Flowsheet Row Pulmonary Rehab from 07/10/2023 in [facility] Cardiac and Pulmonary Rehab  Date 07/10/23  Educator NT  Instruction Review Code 1- Verbalizes Understanding       Education: Resistance Exercise: - Group verbal and visual presentation on the components of exercise prescription. Introduces F.I.T.T principle from ACSM for exercise prescriptions  Reviews F.I.T.T. principles of resistance exercise including progression. Written material given at graduation.    Education: Exercise & Equipment Safety: - Individual verbal instruction and demonstration of equipment use and safety with use of the equipment. Flowsheet Row Pulmonary Rehab from 07/10/2023 in [facility] Cardiac and Pulmonary Rehab  Date 04/01/23  Educator jh  Instruction Review Code 1- Verbalizes Understanding       Education: Exercise Physiology & General Exercise Guidelines: - Group verbal and written instruction with models to review the exercise physiology of the cardiovascular system and associated critical values. Provides general exercise guidelines with specific guidelines to those with heart or lung disease.  Flowsheet Row Pulmonary Rehab from 07/10/2023 in [facility] Cardiac and Pulmonary Rehab  Date 06/26/23  Educator NT  Instruction Review Code 1- [employer] Understanding       Education: Flexibility, Balance, Mind/Body Relaxation: - Group verbal and visual presentation with interactive activity on the components of exercise prescription. Introduces F.I.T.T principle from ACSM for exercise prescriptions. Reviews F.I.T.T. principles of flexibility and balance exercise training including progression. Also discusses the mind body connection.  Reviews various relaxation techniques to help reduce and manage stress (i.e. Deep breathing, progressive muscle relaxation, and visualization). Balance handout provided to take home. Written material given at graduation.   Activity Barriers & Risk  Stratification:  Activity Barriers & Cardiac Risk Stratification - 04/02/23 1431       Activity Barriers & Cardiac Risk Stratification  Activity Barriers Fibromyalgia;Muscular Weakness;Shortness of Breath;Assistive Device   Cane            6 Minute Walk:  6 Minute Walk     Row Name 04/02/23 1427 07/15/23 0944       6 Minute Walk   Phase Initial Discharge    Distance 1180 feet 1290 feet    Distance % Change -- 9.32 %    Distance Feet Change -- 110 ft    Walk Time 6 minutes 6 minutes    # of Rest Breaks 0 0    MPH 2.2 2.44    METS 3.23 3.18    RPE 17 9    Perceived Dyspnea  3 2    VO2 Peak 11.3 11.13    Symptoms No No    Resting HR 110 bpm 73 bpm    Resting BP 92/64 110/70    Resting Oxygen Saturation  95 % 98 %    Exercise Oxygen Saturation  during 6 min walk 89 % 94 %    Max Ex. HR 126 bpm 86 bpm    Max Ex. BP 106/64 122/80    2 Minute Post BP 90/64 110/80      Interval HR   1 Minute HR 116 77    2 Minute HR 126 84    3 Minute HR 125 86    4 Minute HR 121 84    5 Minute HR 125 85    6 Minute HR 126 86    2 Minute Post HR 118 71    Interval Heart Rate? Yes Yes      Interval Oxygen   Interval Oxygen? Yes Yes    Baseline Oxygen Saturation % 95 % 98 %    1 Minute Oxygen Saturation % 95 % 97 %    1 Minute Liters of Oxygen -- 0 L    2 Minute Oxygen Saturation % 93 % 95 %    2 Minute Liters of Oxygen -- 0 L    3 Minute Oxygen Saturation % 91 % 95 %    3 Minute Liters of Oxygen -- 0 L    4 Minute Oxygen Saturation % 89 % 96 %    4 Minute Liters of Oxygen -- 0 L    5 Minute Oxygen Saturation % 97 % 94 %    5 Minute Liters of Oxygen -- 0 L    6 Minute Oxygen Saturation % 98 % 96 %    6 Minute Liters of Oxygen -- 0 L    2 Minute Post Oxygen Saturation % 99 % 98 %            Oxygen Initial Assessment:  Oxygen Initial Assessment - 04/01/23 0946       Home Oxygen   Home Oxygen Device None    Sleep Oxygen Prescription None    Home Exercise Oxygen  Prescription None    Home Resting Oxygen Prescription None      Initial 6 min Walk   Oxygen Used None      Program Oxygen Prescription   Program Oxygen Prescription None      Intervention   Short Term Goals To learn and exhibit compliance with exercise, home and travel O2 prescription;To learn and understand importance of monitoring SPO2 with pulse oximeter and demonstrate accurate use of the pulse oximeter.;To learn and understand importance of maintaining oxygen saturations>88%;To learn and demonstrate proper pursed lip breathing techniques or other breathing techniques. ;To learn  and demonstrate proper use of respiratory medications    Long  Term Goals Exhibits compliance with exercise, home  and travel O2 prescription;Verbalizes importance of monitoring SPO2 with pulse oximeter and return demonstration;Maintenance of O2 saturations>88%;Exhibits proper breathing techniques, such as pursed lip breathing or other method taught during program session;Compliance with respiratory medication;Demonstrates proper use of MDI's             Oxygen Re-Evaluation:  Oxygen Re-Evaluation     Row Name 04/08/23 4098 04/29/23 0959 05/29/23 0929 06/12/23 0945 07/10/23 0941     Program Oxygen Prescription   Program Oxygen Prescription -- None None None None     Home Oxygen   Home Oxygen Device -- None None None None   Sleep Oxygen Prescription -- CPAP  Patient has recently been diagnosed with sleep apnea and is waiting for her CPAP machine to arrive. CPAP CPAP CPAP   Home Exercise Oxygen Prescription -- None None None None   Home Resting Oxygen Prescription -- None None None None   Compliance with Home Oxygen Use -- -- Yes Yes Yes     Goals/Expected Outcomes   Short Term Goals -- To learn and exhibit compliance with exercise, home and travel O2 prescription;To learn and understand importance of monitoring SPO2 with pulse oximeter and demonstrate accurate use of the pulse oximeter.;To learn and  understand importance of maintaining oxygen saturations>88%;To learn and demonstrate proper pursed lip breathing techniques or other breathing techniques. ;To learn and demonstrate proper use of respiratory medications Other Other To learn and exhibit compliance with exercise, home and travel O2 prescription;Other   Long  Term Goals -- Exhibits compliance with exercise, home  and travel O2 prescription;Verbalizes importance of monitoring SPO2 with pulse oximeter and return demonstration;Maintenance of O2 saturations>88%;Exhibits proper breathing techniques, such as pursed lip breathing or other method taught during program session;Compliance with respiratory medication;Demonstrates proper use of MDI's Other Other Exhibits compliance with exercise, home  and travel O2 prescription;Other   Comments Reviewed PLB technique with pt.  Talked about how it works and it's importance in maintaining their exercise saturations. Patient has recently completed a sleep study and has been diagnosed with sleep apnea. She is waiting for her CPAP machine to arrive. She will start using that as soon as she gets it. She does continue to say that her SOB is her main limiting factor with exercise and activity. She does have a pulse ox and moniotrs oxygen saturations at home. Jenna Tyler is doing a sleep study next week. She is supposed to get a CPAP but only slept a few hours when she did the study. Informed her why it is important to use the machine for naps and sleep. Patient verbalizes understanding. Spoke about the importance of wearing her CPAP once it is delivered. She reports she understands and is optimistic it will help her get better sleeep Jenna Tyler had her sleep study and it showed apnea. She has a new machine with a full face mask and is able to use it. Informed her to use it at naps and for sleep. She has felt like it has made a difference since she started.   Goals/Expected Outcomes Short: Become more profiecient at using PLB.  Long: Become independent at using PLB. Short: obtain CPAP machine and start using it during sleep. Long: become independent with using PLB. Short: Do Sleep study. Long: Use CPAP machine routinely . STG: get CPAP and build habit of using it. LTG: Use CPAP routinely Short: use CPAP daily. Long:  maintain use of CPAP independently.            Oxygen Discharge (Final Oxygen Re-Evaluation):  Oxygen Re-Evaluation - 07/10/23 0941       Program Oxygen Prescription   Program Oxygen Prescription None      Home Oxygen   Home Oxygen Device None    Sleep Oxygen Prescription CPAP    Home Exercise Oxygen Prescription None    Home Resting Oxygen Prescription None    Compliance with Home Oxygen Use Yes      Goals/Expected Outcomes   Short Term Goals To learn and exhibit compliance with exercise, home and travel O2 prescription;Other    Long  Term Goals Exhibits compliance with exercise, home  and travel O2 prescription;Other    Comments Jenna Tyler had her sleep study and it showed apnea. She has a new machine with a full face mask and is able to use it. Informed her to use it at naps and for sleep. She has felt like it has made a difference since she started.    Goals/Expected Outcomes Short: use CPAP daily. Long: maintain use of CPAP independently.             Initial Exercise Prescription:  Initial Exercise Prescription - 04/02/23 1400       Date of Initial Exercise RX and Referring Provider   Date 04/02/23    Referring Provider Dr. Alphonzo Dublin      Oxygen   Maintain Oxygen Saturation 88% or higher      Treadmill   MPH 2.2    Grade 0    Minutes 15    METs 2.68      Recumbant Bike   Level 2    RPM 50    Watts 25    Minutes 15    METs 3.23      NuStep   Level 2    SPM 80    Minutes 15    METs 3.23      Arm Ergometer   Level 1    Watts 25    Minutes 15    METs 3.23      T5 Nustep   Level 2    SPM 80    Minutes 15    METs 3.23      Biostep-RELP   Level 2     SPM 50    Minutes 15    METs 3.23      Track   Laps 41    Minutes 15    METs 3.23      Prescription Details   Duration Progress to 30 minutes of continuous aerobic without signs/symptoms of physical distress      Intensity   THRR 40-80% of Max Heartrate 131-152    Ratings of Perceived Exertion 11-13    Perceived Dyspnea 0-4      Progression   Progression Continue progressive overload as per policy without signs/symptoms or physical distress.      Resistance Training   Training Prescription Yes    Weight 3 lb    Reps 10-15             Perform Capillary Blood Glucose checks as needed.  Exercise Prescription Changes:   Exercise Prescription Changes     Row Name 04/02/23 1400 04/16/23 0800 05/01/23 1100 05/15/23 0900 05/27/23 1000     Response to Exercise   Blood Pressure (Admit) 92/64 106/70 108/82 106/62 --   Blood Pressure (Exercise) 106/64 118/72 132/62 140/64 --  Blood Pressure (Exit) 90/64 120/70 108/60 102/64 --   Heart Rate (Admit) 110 bpm 87 bpm 98 bpm 96 bpm --   Heart Rate (Exercise) 126 bpm 102 bpm 110 bpm 125 bpm --   Heart Rate (Exit) 118 bpm 103 bpm 95 bpm 85 bpm --   Oxygen Saturation (Admit) 95 % 96 % 94 % 97 % --   Oxygen Saturation (Exercise) 89 % 98 % 92 % 91 % --   Oxygen Saturation (Exit) 99 % 96 % 92 % 97 % --   Rating of Perceived Exertion (Exercise) 17 15 15 14  --   Perceived Dyspnea (Exercise) 3 3 3 1  --   Symptoms none none none none --   Comments results First full day of exercise First full day of exercise -- --   Duration -- Progress to 30 minutes of  aerobic without signs/symptoms of physical distress Progress to 30 minutes of  aerobic without signs/symptoms of physical distress Continue with 30 min of aerobic exercise without signs/symptoms of physical distress. Continue with 30 min of aerobic exercise without signs/symptoms of physical distress.   Intensity -- THRR unchanged THRR unchanged THRR unchanged THRR unchanged      Progression   Progression -- Continue to progress workloads to maintain intensity without signs/symptoms of physical distress. Continue to progress workloads to maintain intensity without signs/symptoms of physical distress. Continue to progress workloads to maintain intensity without signs/symptoms of physical distress. Continue to progress workloads to maintain intensity without signs/symptoms of physical distress.   Average METs -- 2.55 2.55 2 2     Resistance Training   Training Prescription -- Yes Yes Yes Yes   Weight -- 3 lb 3 lb 1 lb 1 lb   Reps -- 10-15 10-15 10-15 10-15     Interval Training   Interval Training -- No No No No     Oxygen   Oxygen -- -- Continuous -- --     Treadmill   MPH -- -- 2.2 1 1    Grade -- -- 0 0 0   Minutes -- -- 15 15 15    METs -- -- 2.69 1.77 1.77     Recumbant Bike   Level -- 2 -- 3 3   Watts -- 19 -- 19 19   Minutes -- 15 -- 15 15   METs -- 2.7 -- 2.71 2.71     NuStep   Level -- 3 -- -- --   Minutes -- 15 -- -- --   METs -- 2.4 -- -- --     T5 Nustep   Level -- -- 3 4 4    Minutes -- -- 15 15 15    METs -- -- 2 1.9 1.9     Biostep-RELP   Level -- -- -- 4 4   Minutes -- -- -- 15 15   METs -- -- -- 2 2     Home Exercise Plan   Plans to continue exercise at -- -- -- -- Home (comment)  walking and looking into a gym membership   Frequency -- -- -- -- Add 2 additional days to program exercise sessions.   Initial Home Exercises Provided -- -- -- -- 05/27/23     Oxygen   Maintain Oxygen Saturation -- 88% or higher 88% or higher 88% or higher 88% or higher    Row Name 05/28/23 0800 06/13/23 1700 06/25/23 1500 07/08/23 1400       Response to Exercise   Blood Pressure (Admit)  126/84 110/60 130/76 118/62    Blood Pressure (Exercise) 130/64 -- -- --    Blood Pressure (Exit) 114/62 122/70 110/64 106/60    Heart Rate (Admit) 70 bpm 76 bpm 83 bpm 76 bpm    Heart Rate (Exercise) 116 bpm 125 bpm 126 bpm 112 bpm    Heart Rate (Exit) 74 bpm  89 bpm 101 bpm 70 bpm    Oxygen Saturation (Admit) 96 % -- 96 % 92 %    Oxygen Saturation (Exercise) 92 % -- 93 % 93 %    Oxygen Saturation (Exit) 96 % -- 92 % 93 %    Rating of Perceived Exertion (Exercise) 15 16 13 12     Perceived Dyspnea (Exercise) 2 1 2 2     Symptoms none none none none    Duration Continue with 30 min of aerobic exercise without signs/symptoms of physical distress. Continue with 30 min of aerobic exercise without signs/symptoms of physical distress. Continue with 30 min of aerobic exercise without signs/symptoms of physical distress. Continue with 30 min of aerobic exercise without signs/symptoms of physical distress.    Intensity THRR unchanged THRR unchanged THRR unchanged THRR unchanged      Progression   Progression Continue to progress workloads to maintain intensity without signs/symptoms of physical distress. Continue to progress workloads to maintain intensity without signs/symptoms of physical distress. Continue to progress workloads to maintain intensity without signs/symptoms of physical distress. Continue to progress workloads to maintain intensity without signs/symptoms of physical distress.    Average METs 2.6 2.2 2.31 2.88      Resistance Training   Training Prescription Yes Yes Yes Yes    Weight 1 lb 1 lb 1 lb 1 lb    Reps 10-15 10-15 10-15 10-15      Interval Training   Interval Training No No No No      Treadmill   MPH -- 1 1.3 1.6    Grade -- 0 0 0    Minutes -- 15 15 15     METs -- 1.77 1.99 2.23      Recumbant Bike   Level 4 3 4 4     Watts 37 22 36 30    Minutes 15 15 15 15     METs 3.37 2.83 -- 3.11      NuStep   Level 4 4 3 5     Minutes 15 15 15 15     METs 2.8 1.8 2.3 --      T5 Nustep   Level 3 3 -- 4  T5 and T6    Minutes 15 15 -- 15    METs 2 1.8 -- 2      Biostep-RELP   Level -- 4 5 5     Minutes -- 15 15 15     METs -- 2 3 --      Home Exercise Plan   Plans to continue exercise at Home (comment)  walking and looking into  a gym membership Home (comment)  walking and looking into a gym membership Home (comment)  walking and looking into a gym membership Home (comment)  walking and looking into a gym membership    Frequency Add 2 additional days to program exercise sessions. Add 2 additional days to program exercise sessions. Add 2 additional days to program exercise sessions. Add 2 additional days to program exercise sessions.    Initial Home Exercises Provided 05/27/23 05/27/23 05/27/23 05/27/23      Oxygen   Maintain Oxygen Saturation 88% or higher  88% or higher 88% or higher 88% or higher             Exercise Comments:   Exercise Comments     Row Name 04/08/23 [id number]           Exercise Comments First full day of exercise!  Patient was oriented to gym and equipment including functions, settings, policies, and procedures.  Patient's individual exercise prescription and treatment plan were reviewed.  All starting workloads were established based on the results of the 6 minute walk test done at initial orientation visit.  The plan for exercise progression was also introduced and progression will be customized based on patient's performance and goals.                Exercise Goals and Review:   Exercise Goals     Row Name 04/02/23 1435             Exercise Goals   Increase Physical Activity Yes       Intervention Provide advice, education, support and counseling about physical activity/exercise needs.;Develop an individualized exercise prescription for aerobic and resistive training based on initial evaluation findings, risk stratification, comorbidities and participant's personal goals.       Expected Outcomes Long Term: Add in home exercise to make exercise part of routine and to increase amount of physical activity.;Long Term: Exercising regularly at least 3-5 days a week.;Short Term: Attend rehab on a regular basis to increase amount of physical activity.       Increase Strength and Stamina  Yes       Intervention Develop an individualized exercise prescription for aerobic and resistive training based on initial evaluation findings, risk stratification, comorbidities and participant's personal goals.;Provide advice, education, support and counseling about physical activity/exercise needs.       Expected Outcomes Long Term: Improve cardiorespiratory fitness, muscular endurance and strength as measured by increased METs and functional capacity ( );Short Term: Perform resistance training exercises routinely during rehab and add in resistance training at home;Short Term: Increase workloads from initial exercise prescription for resistance, speed, and METs.       Able to understand and use rate of perceived exertion (RPE) scale Yes       Intervention Provide education and explanation on how to use RPE scale       Expected Outcomes Long Term:  Able to use RPE to guide intensity level when exercising independently;Short Term: Able to use RPE daily in rehab to express subjective intensity level       Able to understand and use Dyspnea scale Yes       Intervention Provide education and explanation on how to use Dyspnea scale       Expected Outcomes Long Term: Able to use Dyspnea scale to guide intensity level when exercising independently;Short Term: Able to use Dyspnea scale daily in rehab to express subjective sense of shortness of breath during exertion       Knowledge and understanding of Target Heart Rate Range (THRR) Yes       Intervention Provide education and explanation of THRR including how the numbers were predicted and where they are located for reference       Expected Outcomes Long Term: Able to use THRR to govern intensity when exercising independently;Short Term: Able to use daily as guideline for intensity in rehab;Short Term: Able to state/look up THRR       Able to check pulse independently Yes       Intervention Review the  importance of being able to check your own pulse for  safety during independent exercise;Provide education and demonstration on how to check pulse in carotid and radial arteries.       Expected Outcomes Long Term: Able to check pulse independently and accurately;Short Term: Able to explain why pulse checking is important during independent exercise       Understanding of Exercise Prescription Yes       Intervention Provide education, explanation, and written materials on patient's individual exercise prescription       Expected Outcomes Long Term: Able to explain home exercise prescription to exercise independently;Short Term: Able to explain program exercise prescription                Exercise Goals Re-Evaluation :  Exercise Goals Re-Evaluation     Row Name 04/08/23 0936 04/16/23 0817 05/01/23 1205 05/15/23 0933 05/27/23 1003     Exercise Goal Re-Evaluation   Exercise Goals Review Able to understand and use rate of perceived exertion (RPE) scale;Able to understand and use Dyspnea scale;Knowledge and understanding of Target Heart Rate Range (THRR);Understanding of Exercise Prescription Increase Physical Activity;Increase Strength and Stamina;Understanding of Exercise Prescription Increase Physical Activity;Increase Strength and Stamina;Understanding of Exercise Prescription Increase Physical Activity;Increase Strength and Stamina;Understanding of Exercise Prescription Increase Physical Activity;Able to understand and use rate of perceived exertion (RPE) scale;Knowledge and understanding of Target Heart Rate Range (THRR);Understanding of Exercise Prescription;Increase Strength and Stamina;Able to understand and use Dyspnea scale;Able to check pulse independently   Comments Reviewed RPE and dyspnea scale, THR and program prescription with pt today.  Pt voiced understanding and was given a copy of goals to take home. Jenna Tyler is off to a good start in the program. She was able to work at level 3 on the T4 nustep and level 2 on the recumbent bike during  her first day of exercise. She also did well with 3 lb hand weights for resistance training. We will continue to monitor her progress in the program. Jenna Tyler is doing well in rehab. She was able to use the treadmill at a speed of 2.73mph. She was also able to use the T4 nustep at level 3. We will continue to monitor her progress in the program. Jenna Tyler is doing well in rehab. She increased her level on the recumbent bike to 3 and increased her level to 4 on the T5 nustep and biostep. We will continue to monitor her progress in the program. Reviewed home exercise with pt today from 9:38am to 9:50am.  Pt plans to walk and look into possibly join a gym for exercise.  Reviewed THR, pulse, RPE, sign and symptoms, pulse oximetery and when to call 911 or MD.  Also discussed weather considerations and indoor options.  Pt voiced understanding.   Expected Outcomes Short: Use RPE daily to regulate intensity. Long: Follow program prescription in THR. Short: Continue to follow current exercise prescription. Long: Continue exercise to improve strength and stamina. Short: Continue to follow current exercise prescription. Long: Continue exercise to improve strength and stamina. Short: Continue to progressively increase workloads on the recumbent bike, T5 nustep, and biostep. Long: Continue exercise to improve strength and stamina. Short: add 1-2 days a week of exercies at home on off days of rehab. Long: maintain independent exercise routine upon graduation from pulmonary rehab.    Row Name 05/28/23 1610 06/12/23 0947 06/13/23 1730 06/25/23 1517 07/08/23 1439     Exercise Goal Re-Evaluation   Exercise Goals Review Increase Physical Activity;Increase Strength and Stamina;Understanding  of Exercise Prescription Increase Physical Activity;Increase Strength and Stamina;Understanding of Exercise Prescription Increase Physical Activity;Increase Strength and Stamina;Understanding of Exercise Prescription Increase Physical  Activity;Increase Strength and Stamina;Understanding of Exercise Prescription Increase Physical Activity;Increase Strength and Stamina;Understanding of Exercise Prescription   Comments Jenna Tyler is doing well in rehab. She has been able to increase her level on the recumbent bike from level 3 to 4. She was also able to increase to level 4 on the T4 nustep. We will continue to monitor her progress in the program. Jenna Tyler is on T4 currently on level 4. She reports she is doing well, feeling that she can push her self more than when she started. Jenna Tyler her to work towards exercising at home more, drinking water when she exercises will help with her water goal too. Jenna Tyler is doing well in rehab. She was not able to increase her workloads during this review, but was able to maintain them. She was able to use the treadmill at a speed of and no incline, and maintained level 3 on the T5 nustep and recumbent bike. We will continue to monitor her progress in the program. Jenna Tyler is doing well in rehab. She increased her workload on the treadmill to a speed of 1.3 mph with no incline. She also increased to level 5 on the biostep and level 4 on the recumbent bike. We will continue to monitor her progress in the program. Jenna Tyler is doing well in rehab. She increased her speed on the treadmill to 1.6 mph with no incline. She also increased her level on the T4 nustep to level 5 and to level 4 on the T5 nustep. We will continue to monitor her progress in the program.   Expected Outcomes Short: Continue to increase levels on the Recumbent bike, and T4 nustep. Long: Continue exercise to improve strength and stamina. STG: work towards increasing workload at rehab and adding more home exercise. LTG: Continue to exercise to improve strength and stamina Short: Increase treadmill workloads. Long: Continue exercise to improve strength and stamina. Short: Continue to progressively increase treadmill, biostep, and recumbent bike workloads.  Long: Continue exercise to improve strength and stamina. Short: Continue to progressively increase treadmill workload. Long: Continue exercise to improve strength and stamina.    Row Name 07/15/23 0950             Exercise Goal Re-Evaluation   Exercise Goals Review Increase Physical Activity;Increase Strength and Stamina;Understanding of Exercise Prescription       Comments Jenna Tyler is graduating today as she is having surgery. She joined the [facility] for her exercise plan.       Expected Outcomes Short: Graduate. Long: Continue with exercise independently.                Discharge Exercise Prescription (Final Exercise Prescription Changes):  Exercise Prescription Changes - 07/08/23 1400       Response to Exercise   Blood Pressure (Admit) 118/62    Blood Pressure (Exit) 106/60    Heart Rate (Admit) 76 bpm    Heart Rate (Exercise) 112 bpm    Heart Rate (Exit) 70 bpm    Oxygen Saturation (Admit) 92 %    Oxygen Saturation (Exercise) 93 %    Oxygen Saturation (Exit) 93 %    Rating of Perceived Exertion (Exercise) 12    Perceived Dyspnea (Exercise) 2    Symptoms none    Duration Continue with 30 min of aerobic exercise without signs/symptoms of physical distress.    Intensity THRR  unchanged      Progression   Progression Continue to progress workloads to maintain intensity without signs/symptoms of physical distress.    Average METs 2.88      Resistance Training   Training Prescription Yes    Weight 1 lb    Reps 10-15      Interval Training   Interval Training No      Treadmill   MPH 1.6    Grade 0    Minutes 15    METs 2.23      Recumbant Bike   Level 4    Watts 30    Minutes 15    METs 3.11      NuStep   Level 5    Minutes 15      T5 Nustep   Level 4   T5 and T6   Minutes 15    METs 2      Biostep-RELP   Level 5    Minutes 15      Home Exercise Plan   Plans to continue exercise at Home (comment)   walking and looking into a gym membership   Frequency  Add 2 additional days to program exercise sessions.    Initial Home Exercises Provided 05/27/23      Oxygen   Maintain Oxygen Saturation 88% or higher             Nutrition:  Target Goals: Understanding of nutrition guidelines, daily intake of sodium 1500mg , cholesterol 200mg , calories 30% from fat and 7% or less from saturated fats, daily to have 5 or more servings of fruits and vegetables.  Education: All About Nutrition: -Group instruction provided by verbal, written material, interactive activities, discussions, models, and posters to present general guidelines for heart healthy nutrition including fat, fiber, MyPlate, the role of sodium in heart healthy nutrition, utilization of the nutrition label, and utilization of this knowledge for meal planning. Follow up email sent as well. Written material given at graduation. Flowsheet Row Pulmonary Rehab from 07/10/2023 in [facility] Re[facility]  Date 05/08/23  Educator JG  Instruction Review Code 1- Verbalizes Understanding       Biometrics:  Pre Biometrics - 04/02/23 1435       Pre Biometrics   Height 5' 5.8" (1.671 m)    Weight 187 lb 1.6 oz (84.9 kg)    Waist Circumference 41 inches    Hip Circumference 44.5 inches    Waist to Hip Ratio 0.92 %    BMI (Calculated) 30.39    Single Leg Stand 7.15 seconds             Post Biometrics - 07/15/23 0947        Post  Biometrics   Height 5' 5.8" (1.671 m)    Weight 190 lb 12.8 oz (86.5 kg)    Waist Circumference 42 inches    Hip Circumference 44 inches    Waist to Hip Ratio 0.95 %    BMI (Calculated) 31    Single Leg Stand 7.8 seconds             Nutrition Therapy Plan and Nutrition Goals:  Nutrition Therapy & Goals - 05/13/23 1401       Nutrition Therapy   Diet carb controlled, mediterranean    Protein (specify units) 90    Fiber 25 grams    Whole Grain Foods 3 servings    Saturated Fats 15 max. grams    Fruits and Vegetables 5 servings/day  Sodium 2 grams      Personal Nutrition Goals   Nutrition Goal Drink less Mt dew and at least 1 bottle of water per day    Personal Goal #2 Eat a smaller breakfast with 15-30gProtein and 30-60gCarbs at each meal.    Personal Goal #3 Include more colorful produce, aim for 5-8 servings of fruits and veggies per day    Comments She doesn't drink any water, instead drinks mt dew. 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 Commended her on this accomplishment. Spoke with her about her A1C of 6.2%. Educated on carbs counting, target of 30-60g per meal. Went over different carb choices and how to improve the quality of carb choices. Encouraged more fruits and whole grains. Went over several small meal and snack ideas to help her be more consistent with her complex carb intake. Set goal to work on eating smaller breakfast with protein and complex carbs.      Intervention Plan   Intervention Prescribe, educate and counsel regarding individualized specific dietary modifications aiming towards targeted core components such as weight, hypertension, lipid management, diabetes, heart failure and other comorbidities.;Nutrition handout(s) given to patient.    Expected Outcomes Short Term Goal: Understand basic principles of dietary content, such as calories, fat, sodium, cholesterol and nutrients.;Short Term Goal: A plan has been developed with personal nutrition goals set during dietitian appointment.;Long Term Goal: Adherence to prescribed nutrition plan.             Nutrition Assessments:  MEDIFICTS Score Key: >=70 Need to make dietary changes  40-70 Heart Healthy Diet <= 40 Therapeutic Level Cholesterol Diet  Flowsheet Row Pulmonary Rehab from 07/15/2023 in [facility] Cardiac and Pulmonary Rehab  Picture Your Plate Total Score on Admission 52  Picture Your Plate Total Score on Discharge 50      Picture Your Plate Scores: <16 Unhealthy dietary pattern with much room for improvement. 41-50 Dietary pattern unlikely to  meet recommendations for good health and room for improvement. 51-60 More healthful dietary pattern, with some room for improvement.  >60 Healthy dietary pattern, although there may be some specific behaviors that could be improved.   Nutrition Goals Re-Evaluation:  Nutrition Goals Re-Evaluation     Row Name 04/29/23 1004 06/12/23 0940           Goals   Comment Patient has not met with RD yet. She is interested in meeting for a nutrition appointment. Appointment was scheduled for one on one RD apt on 05/06/2023 She is still working on reducing mt dew and replacing it with water. Refocused goal towards increasing water to at least 1 bottle per day. She reports she has been working on the goal of eating smaller breakfast.      Expected Outcome Short: meet with RD and set specific nutrition goals. Long: maintain heart healthy eating habits. STG: drink 1 bottle of water per day. LTG: maintain a hearth healthy lifestyle               Nutrition Goals Discharge (Final Nutrition Goals Re-Evaluation):  Nutrition Goals Re-Evaluation - 06/12/23 0940       Goals   Comment She is still working on reducing mt dew and replacing it with water. Refocused goal towards increasing water to at least 1 bottle per day. She reports she has been working on the goal of eating smaller breakfast.    Expected Outcome STG: drink 1 bottle of water per day. LTG: maintain a hearth healthy lifestyle  Psychosocial: Target Goals: Acknowledge presence or absence of significant depression and/or stress, maximize coping skills, provide positive support system. Participant is able to verbalize types and ability to use techniques and skills needed for reducing stress and depression.   Education: Stress, Anxiety, and Depression - Group verbal and visual presentation to define topics covered.  Reviews how body is impacted by stress, anxiety, and depression.  Also discusses healthy ways to reduce stress and to  treat/manage anxiety and depression.  Written material given at graduation. Flowsheet Row Pulmonary Rehab from 07/10/2023 in [facility] Cardiac and Pulmonary Rehab  Date 06/19/23  Educator SB  Instruction Review Code 1- [employer] Understanding       Education: Sleep Hygiene -Provides group verbal and written instruction about how sleep can affect your health.  Define sleep hygiene, discuss sleep cycles and impact of sleep habits. Review good sleep hygiene tips.    Initial Review & Psychosocial Screening:  Initial Psych Review & Screening - 04/01/23 0948       Initial Review   Current issues with Current Sleep Concerns      Family Dynamics   Good Support System? Yes    Comments She can look to her room mate and best friends for support. Sometimes she does not sleep well do to possible sleep apnea. Jenna Tyler goes to a [place] group therapy for recovery.      Barriers   Psychosocial barriers to participate in program The patient should benefit from training in stress management and relaxation.      Screening Interventions   Interventions To provide support and resources with identified psychosocial needs;Encouraged to exercise;Provide feedback about the scores to participant    Expected Outcomes Short Term goal: Utilizing psychosocial counselor, staff and physician to assist with identification of specific Stressors or current issues interfering with healing process. Setting desired goal for each stressor or current issue identified.;Long Term Goal: Stressors or current issues are controlled or eliminated.;Short Term goal: Identification and review with participant of any Quality of Life or Depression concerns found by scoring the questionnaire.;Long Term goal: The participant improves quality of Life and PHQ9 Scores as seen by post scores and/or verbalization of changes             Quality of Life Scores:  Scores of 19 and below usually indicate a poorer quality of life in these areas.   A difference of  2-3 points is a clinically meaningful difference.  A difference of 2-3 points in the total score of the Quality of Life Index has been associated with significant improvement in overall quality of life, self-image, physical symptoms, and general health in studies assessing change in quality of life.  PHQ-9: Review Flowsheet       07/15/2023 04/02/2023 07/07/2015 04/04/2015  Depression screen PHQ 2/9  Decreased Interest 0 0 0 0  Down, Depressed, Hopeless 0 0 0 0  PHQ - 2 Score 0 0 0 0  Altered sleeping 0 2 - -  Tired, decreased energy 1 1 - -  Change in appetite 0 0 - -  Feeling bad or failure about yourself  0 0 - -  Trouble concentrating 0 1 - -  Moving slowly or fidgety/restless 0 0 - -  Suicidal thoughts 0 0 - -  PHQ-9 Score 1 4 - -  Difficult doing work/chores Not difficult at all Somewhat difficult - -   Interpretation of Total Score  Total Score Depression Severity:  1-4 = Minimal depression, 5-9 = Mild depression, 10-14 =  Moderate depression, 15-19 = Moderately severe depression, 20-27 = Severe depression   Psychosocial Evaluation and Intervention:  Psychosocial Evaluation - 04/01/23 0951       Psychosocial Evaluation & Interventions   Interventions Relaxation education;Stress management education;Encouraged to exercise with the program and follow exercise prescription    Comments She can look to her room mate and best friends for support. Sometimes she does not sleep well do to possible sleep apnea. Jenna Tyler goes to a [place] group therapy for recovery.    Expected Outcomes Short: Start [facility] to help with mood. Long: Maintain a healthy mental state.    Continue Psychosocial Services  Follow up required by staff             Psychosocial Re-Evaluation:  Psychosocial Re-Evaluation     Row Name 04/29/23 1003 05/29/23 0933 06/12/23 0937         Psychosocial Re-Evaluation   Current issues with Current Sleep Concerns Current Sleep Concerns Current  Stress Concerns     Comments Patient has recently completed a sleep study and has been diagnosed with sleep apnea. She is waiting for her CPAP machine to arrive. She will start using that as soon as she gets it. She does continue to say that her SOB is her main limiting factor with exercise and activity. Patient reports no issues with their current mental states, sleep, stress, depression or anxiety. Will follow up with patient in a few weeks for any changes. She reports no concerns with stress, anxiety, or depression at this time. Says she has a good support system. She is working with her medical team to imporve her sleep. says some medications have helped her sleep better the past week. She is also expecting a CPAP to be delivered in the next week or two. Overall, her sleep is improving.     Expected Outcomes Short: obtain CPAP machine and start using it to aid in sleep quality. Long: maintain good sleep patterns long term. Short: Continue to exercise regularly to support mental health and notify staff of any changes. Long: maintain mental health and well being through teaching of rehab or prescribed medications independently. STG: Continue to focus on sleeping well, and use CPAP once delivered. LTG: maintain positive outlook on health and daily life     Interventions Encouraged to attend Pulmonary Rehabilitation for the exercise Encouraged to attend Pulmonary Rehabilitation for the exercise Encouraged to attend Pulmonary Rehabilitation for the exercise     Continue Psychosocial Services  Follow up required by staff Follow up required by staff Follow up required by staff              Psychosocial Discharge (Final Psychosocial Re-Evaluation):  Psychosocial Re-Evaluation - 06/12/23 0937       Psychosocial Re-Evaluation   Current issues with Current Stress Concerns    Comments She reports no concerns with stress, anxiety, or depression at this time. Says she has a good support system. She is  working with her medical team to imporve her sleep. says some medications have helped her sleep better the past week. She is also expecting a CPAP to be delivered in the next week or two. Overall, her sleep is improving.    Expected Outcomes STG: Continue to focus on sleeping well, and use CPAP once delivered. LTG: maintain positive outlook on health and daily life    Interventions Encouraged to attend Pulmonary Rehabilitation for the exercise    Continue Psychosocial Services  Follow up required by staff  Education: Education Goals: Education classes will be provided on a weekly basis, covering required topics. Participant will state understanding/return demonstration of topics presented.  Learning Barriers/Preferences:  Learning Barriers/Preferences - 04/01/23 0946       Learning Barriers/Preferences   Learning Barriers None    Learning Preferences None             General Pulmonary Education Topics:  Infection Prevention: - Provides verbal and written material to individual with discussion of infection control including proper hand washing and proper equipment cleaning during exercise session. Flowsheet Row Pulmonary Rehab from 07/10/2023 in [facility] Cardiac and Pulmonary Rehab  Date 04/01/23  Educator [facility]  Instruction Review Code 1- Verbalizes Understanding       Falls Prevention: - Provides verbal and written material to individual with discussion of falls prevention and safety. Flowsheet Row Pulmonary Rehab from 07/10/2023 in [facility] Cardiac and Pulmonary Rehab  Date 04/01/23  Educator [facility]  Instruction Review Code 1- Verbalizes Understanding       Chronic Lung Disease Review: - Group verbal instruction with posters, models, PowerPoint presentations and videos,  to review new updates, new respiratory medications, new advancements in procedures and treatments. Providing information on websites and "800" numbers for continued self-education. Includes information  about supplement oxygen, available portable oxygen systems, continuous and intermittent flow rates, oxygen safety, concentrators, and Medicare reimbursement for oxygen. Explanation of Pulmonary Drugs, including class, frequency, complications, importance of spacers, rinsing mouth after steroid MDI's, and proper cleaning methods for nebulizers. Review of basic lung anatomy and physiology related to function, structure, and complications of lung disease. Review of risk factors. Discussion about methods for diagnosing sleep apnea and types of masks and machines for OSA. Includes a review of the use of types of environmental controls: home humidity, furnaces, filters, dust mite/pet prevention, HEPA vacuums. Discussion about weather changes, air quality and the benefits of nasal washing. Instruction on Warning signs, infection symptoms, calling MD promptly, preventive modes, and value of vaccinations. Review of effective airway clearance, coughing and/or vibration techniques. Emphasizing that all should Create an Action Plan. Written material given at graduation. Flowsheet Row Pulmonary Rehab from 07/10/2023 in [facility] Pulmonary Re[facility]  Date 06/12/23  Educator [facility]  Instruction Review Code 1- Verbalizes Understanding       AED/CPR: - Group verbal and written instruction with the use of models to demonstrate the basic use of the AED with the basic ABC's of resuscitation.    Anatomy and Cardiac Procedures: - Group verbal and visual presentation and models provide information about basic cardiac anatomy and function. Reviews the testing methods done to diagnose heart disease and the outcomes of the test results. Describes the treatment choices: Medical Management, Angioplasty, or Coronary Bypass Surgery for treating various heart conditions including Myocardial Infarction, Angina, Valve Disease, and Cardiac Arrhythmias.  Written material given at graduation.   Medication Safety: - Group verbal and  visual instruction to review commonly prescribed medications for heart and lung disease. Reviews the medication, class of the drug, and side effects. Includes the steps to properly store meds and maintain the prescription regimen.  Written material given at graduation. Flowsheet Row Pulmonary Rehab from 07/10/2023 in [facility] Cardiac and Pulmonary Rehab  Date 05/29/23  Educator [facility]  Instruction Review Code 1- Verbalizes Understanding       Other: -Provides group and verbal instruction on various topics (see comments)   Knowledge Questionnaire Score:  Knowledge Questionnaire Score - 07/15/23 1628       Knowledge Questionnaire Score  Post Score 11/18              Core Components/Risk Factors/Patient Goals at Admission:  Personal Goals and Risk Factors at Admission - 04/01/23 0946       Core Components/Risk Factors/Patient Goals on Admission    Weight Management Yes    Intervention Weight Management: Develop a combined nutrition and exercise program designed to reach desired caloric intake, while maintaining appropriate intake of nutrient and fiber, sodium and fats, and appropriate energy expenditure required for the weight goal.;Weight Management: Provide education and appropriate resources to help participant work on and attain dietary goals.;Weight Management/Obesity: Establish reasonable short term and long term weight goals.    Expected Outcomes Long Term: Adherence to nutrition and physical activity/exercise program aimed toward attainment of established weight goal;Short Term: Continue to assess and modify interventions until short term weight is achieved;Weight Loss: Understanding of general recommendations for a balanced deficit meal plan, which promotes 1-2 lb weight loss per week and includes a negative energy balance of [phone] kcal/d;Understanding recommendations for meals to include 15-35% energy as protein, 25-35% energy from fat, 35-60% energy from carbohydrates, less than  200mg  of dietary cholesterol, 20-35 gm of total fiber daily;Understanding of distribution of calorie intake throughout the day with the consumption of 4-5 meals/snacks    Improve shortness of breath with ADL's Yes    Intervention Provide education, individualized exercise plan and daily activity instruction to help decrease symptoms of SOB with activities of daily living.    Expected Outcomes Short Term: Improve cardiorespiratory fitness to achieve a reduction of symptoms when performing ADLs;Long Term: Be able to perform more ADLs without symptoms or delay the onset of symptoms    Diabetes Yes    Intervention Provide education about signs/symptoms and action to take for hypo/hyperglycemia.;Provide education about proper nutrition, including hydration, and aerobic/resistive exercise prescription along with prescribed medications to achieve blood glucose in normal ranges: Fasting glucose 65-99 mg/dL    Expected Outcomes Short Term: Participant verbalizes understanding of the signs/symptoms and immediate care of hyper/hypoglycemia, proper foot care and importance of medication, aerobic/resistive exercise and nutrition plan for blood glucose control.;Long Term: Attainment of HbA1C < 7%.    Hypertension Yes    Intervention Provide education on lifestyle modifcations including regular physical activity/exercise, weight management, moderate sodium restriction and increased consumption of fresh fruit, vegetables, and low fat dairy, alcohol moderation, and smoking cessation.;Monitor prescription use compliance.    Expected Outcomes Short Term: Continued assessment and intervention until BP is < 140/22mm HG in hypertensive participants. < 130/28mm HG in hypertensive participants with diabetes, heart failure or chronic kidney disease.;Long Term: Maintenance of blood pressure at goal levels.    Lipids Yes    Intervention Provide education and support for participant on nutrition & aerobic/resistive exercise along  with prescribed medications to achieve LDL 70mg , HDL >40mg .    Expected Outcomes Short Term: Participant states understanding of desired cholesterol values and is compliant with medications prescribed. Participant is following exercise prescription and nutrition guidelines.;Long Term: Cholesterol controlled with medications as prescribed, with individualized exercise RX and with personalized nutrition plan. Value goals: LDL < 70mg , HDL > 40 mg.             Education:Diabetes - Individual verbal and written instruction to review signs/symptoms of diabetes, desired ranges of glucose level fasting, after meals and with exercise. Acknowledge that pre and post exercise glucose checks will be done for 3 sessions at entry of program. Flowsheet Row Pulmonary Rehab from 07/10/2023  in [facility] Cardiac and Pulmonary Rehab  Date 04/01/23  Educator [facility]  Instruction Review Code 1- Verbalizes Understanding       Know Your Numbers and Heart Failure: - Group verbal and visual instruction to discuss disease risk factors for cardiac and pulmonary disease and treatment options.  Reviews associated critical values for Overweight/Obesity, Hypertension, Cholesterol, and Diabetes.  Discusses basics of heart failure: signs/symptoms and treatments.  Introduces Heart Failure Zone chart for action plan for heart failure.  Written material given at graduation. Flowsheet Row Pulmonary Rehab from 07/10/2023 in [facility] Cardiac and Pulmonary Rehab  Date 06/05/23  Educator [facility]  Instruction Review Code 1- Verbalizes Understanding       Core Components/Risk Factors/Patient Goals Review:   Goals and Risk Factor Review     Row Name 04/29/23 [phone] 05/29/23 0934 06/12/23 0942 07/10/23 0945       Core Components/Risk Factors/Patient Goals Review   Personal Goals Review Diabetes;Hypertension Improve shortness of breath with ADL's Improve shortness of breath with ADL's Other    Review Patient reported that her doctor had prescribed  medication to help control blood sugars but she has not been taking it. She has been trying to manage her blood sugars with nutrion. She has not told her doctor this and she was encouraged to call her doctor inform them that she has not been taking her DM meds and to discuss options with her care plan. She does have a blood pressure cuff at home but does not consistently use it. She was encouraged to start checking her blood pressure at home. She does take her BP meds. Spoke to patient about their shortness of breath and what they can do to improve. Patient has been informed of breathing techniques when starting the program. Patient is informed to tell staff if they have had any med changes and that certain meds they are taking or not taking can be causing shortness of breath. Patient reports she is doing much better with ADLs and says her shortness of breath is much improved. Reviewed breathing techniques and encouraged her to continue to attend [facility] rehab. Jenna Tyler is going to be done with the program on Monday. She is going to have Bronchialthermalplasty. She states her doctor wants her to return to rehab after she heals from surgery.    Expected Outcomes Short: call doctor to discuss diabetes medicaiton concerns. Start checking BP at home. Long: work with medical team to control cardiac risk factors Short: Attend [facility] regularly to improve shortness of breath with ADL's. Long: maintain independence with ADL's STG: attend [facility], increase workload as able. LTG: maintain independence with ADLs Short: Graduate [facility]. Long: retrun to [employer] post surgery.             Core Components/Risk Factors/Patient Goals at Discharge (Final Review):   Goals and Risk Factor Review - 07/10/23 0945       Core Components/Risk Factors/Patient Goals Review   Personal Goals Review Other    Review Jenna Tyler is going to be done with the program on Monday. She is going to have Bronchialthermalplasty. She states  her doctor wants her to return to rehab after she heals from surgery.    Expected Outcomes Short: Graduate [facility]. Long: retrun to [employer] post surgery.             ITP Comments:  ITP Comments     Row Name 04/01/23 0945 04/02/23 1426 04/08/23 0936 04/10/23 0937 05/08/23 1245   ITP Comments Virtual Visit completed. Patient informed on  EP and RD appointment and 6 Minute walk test. Patient also informed of patient health questionnaires on My Chart. Patient Verbalizes understanding. Visit diagnosis can be found in [facility] 01/13/[facility]. Completed and gym orientation. Initial ITP created and sent for review to Dr. Jinny Sanders, Medical Director. First full day of exercise!  Patient was oriented to gym and equipment including functions, settings, policies, and procedures.  Patient's individual exercise prescription and treatment plan were reviewed.  All starting workloads were established based on the results of the 6 minute walk test done at initial orientation visit.  The plan for exercise progression was also introduced and progression will be customized based on patient's performance and goals. 30 Day review completed. Medical Director ITP review done, changes made as directed, and signed approval by Medical Director.    new to program 30 Day review completed. Medical Director ITP review done, changes made as directed, and signed approval by Medical Director.    Row Name 06/05/23 0725 07/03/23 0748         ITP Comments 30 Day review completed. Medical Director ITP review done, changes made as directed, and signed approval by Medical Director. 30 Day review completed. Medical Director ITP review done, changes made as directed, and signed approval by Medical Director.               Comments: Discharge ITP

## 2023-07-15 NOTE — Progress Notes (Signed)
 Pulmonary Individual Treatment Plan  Patient Details  Name: Jenna Tyler MRN: [id number] Date of Birth: [birth year] Referring Provider:   Flowsheet Row Pulmonary Rehab from 04/02/2023 in [facility] Cardiac and Pulmonary Rehab  Referring Provider Dr. Alphonzo Dublin       Initial Encounter Date:  Flowsheet Row Pulmonary Rehab from 04/02/2023 in [facility] and Pulmonary Rehab  Date 04/02/23       Visit Diagnosis: Chronic obstructive pulmonary disease, unspecified COPD type (HCC)  Patient's Home Medications on Admission:  Current Outpatient Medications:    albuterol (PROVENTIL) (2.5 MG/3ML) 0.083% nebulizer solution, Inhale into the lungs., Disp: , Rfl:    albuterol (VENTOLIN HFA) 108 (90 Base) MCG/ACT inhaler, Inhale 1-2 puffs into the lungs every 4 (four) hours as needed. (Patient not taking: Reported on 04/01/2023), Disp: , Rfl:    Blood Glucose Monitoring Suppl (GLUCOCOM BLOOD GLUCOSE MONITOR) DEVI, 1 each as directed, Disp: , Rfl:    budesonide (PULMICORT) 0.25 MG/2ML nebulizer solution, Inhale into the lungs., Disp: , Rfl:    butalbital-acetaminophen-caffeine (FIORICET) 50-325-40 MG tablet, Take 1 tablet by mouth every 4 (four) hours as needed for headache., Disp: 14 tablet, Rfl: 0   chlorpheniramine-HYDROcodone (TUSSIONEX) 10-8 MG/5ML, Take 5 mLs by mouth every 12 (twelve) hours., Disp: 70 mL, Rfl: 0   Cholecalciferol 50 MCG (2000 UT) TABS, Take by mouth. (Patient not taking: Reported on 04/01/2023), Disp: , Rfl:    Cysteamine Bitartrate (PROCYSBI) 300 MG PACK, Use 1 each once daily Use as instructed., Disp: , Rfl:    dicyclomine (BENTYL) 20 MG tablet, Take 1 tablet (20 mg total) by mouth every 8 (eight) hours as needed., Disp: 15 tablet, Rfl: 0   doxycycline (VIBRAMYCIN) 100 MG capsule, SMARTSIG:1.0 Capsule(s) By Mouth Twice Daily, Disp: , Rfl:    gabapentin (NEURONTIN) 800 MG tablet, Take 800 mg by mouth 2 (two) times daily., Disp: , Rfl:    glucose blood (ACCU-CHEK GUIDE  TEST) test strip, 1 each (1 strip total) once daily Use as instructed., Disp: , Rfl:    HYDROcodone-acetaminophen (NORCO/VICODIN) 5-325 MG tablet, Take 1 tablet by mouth every 4 (four) hours as needed., Disp: 15 tablet, Rfl: 0   hydrOXYzine (ATARAX) 25 MG tablet, Take by mouth. (Patient not taking: Reported on 04/01/2023), Disp: , Rfl:    ibuprofen (ADVIL) 200 MG tablet, Take by mouth., Disp: , Rfl:    levothyroxine (SYNTHROID) 100 MCG tablet, Take 100 mcg by mouth daily., Disp: , Rfl:    meclizine (ANTIVERT) 25 MG tablet, Take 25mg  1-3 times daily as needed (Patient not taking: Reported on 04/01/2023), Disp: , Rfl:    metFORMIN (GLUCOPHAGE) 500 MG tablet, Take 500 mg by mouth 2 (two) times daily. (Patient not taking: Reported on 04/01/2023), Disp: , Rfl:    methocarbamol (ROBAXIN) 750 MG tablet, Take 750 mg by mouth 2 (two) times daily. (Patient not taking: Reported on 04/01/2023), Disp: , Rfl:    metoprolol tartrate (LOPRESSOR) 25 MG tablet, Take 25 mg by mouth every morning., Disp: , Rfl:    Multiple Vitamin (MULTIVITAMIN WITH MINERALS) TABS tablet, Take 1 tablet by mouth daily. (Patient not taking: Reported on 04/01/2023), Disp: , Rfl:    nortriptyline (PAMELOR) 10 MG capsule, , Disp: , Rfl:    omeprazole (PRILOSEC) 40 MG capsule, Take by mouth., Disp: , Rfl:    ondansetron (ZOFRAN) 4 MG tablet, Take 8 mg by mouth 2 (two) times daily., Disp: , Rfl:    ondansetron (ZOFRAN-ODT) 4 MG disintegrating tablet, Take 1 tablet (  4 mg total) by mouth every 6 (six) hours as needed for nausea or vomiting. (Patient not taking: Reported on 04/01/2023), Disp: 20 tablet, Rfl: 0   polyethylene glycol (MIRALAX) 17 g packet, Take 17 g by mouth daily. (Patient not taking: Reported on 04/01/2023), Disp: 30 each, Rfl: 0   rOPINIRole (REQUIP) 0.5 MG tablet, Take 1 mg by mouth 2 (two) times daily., Disp: , Rfl:    SYMBICORT 160-4.5 MCG/ACT inhaler, Inhale 2 puffs into the lungs 2 (two) times daily., Disp: , Rfl:   Past Medical  History: Past Medical History:  Diagnosis Date   AKI (acute kidney injury) (HCC) 07/12/2017   Asthma    COPD (chronic obstructive pulmonary disease) (HCC)    COPD exacerbation (HCC) 12/29/2016   COPD with acute exacerbation (HCC) 10/02/2022   DM (diabetes mellitus), type 2 (HCC)    Dyspnea    Fibromyalgia    Guillain Barr syndrome (HCC)    Headache    Hyperlipidemia    Hypertension    Hypothyroidism    Neurofibromatosis, peripheral, NF1 (HCC) 2017   Pancreatitis    PONV (postoperative nausea and vomiting)    Pulmonary nodule    RLS (restless legs syndrome)    Sleep apnea    never went to pick up her cpap   Tachycardia    Tracheomalacia     Tobacco Use: Social History   Tobacco Use  Smoking Status Former   Current packs/day: 0.00   Average packs/day: 1 pack/day for 15.0 years (15.0 ttl pk-yrs)   Types: Cigarettes   Start date: 12/29/1982   Quit date: 12/28/1997   Years since quitting: 25.5  Smokeless Tobacco Never    Labs: Review Flowsheet  More data may exist      Latest Ref Rng & Units 06/27/2015 12/29/2016 07/09/2019 07/10/2019 10/04/2021  Labs for ITP Cardiac and Pulmonary Rehab  Cholestrol 0 - 200 mg/dL - 161  - - -  LDL (calc) 0 - 99 mg/dL - 096  - - -  HDL-C >04 mg/dL - 47  - - -  Trlycerides <150 mg/dL - 540  - - -  Hemoglobin A1c 4.8 - 5.6 % 5.9  5.7  - 6.3  6.2   PH, Arterial 7.350 - 7.450 - - 7.39  - -  PCO2 arterial 32.0 - 48.0 mmHg - - 30  - -  Bicarbonate 20.0 - 28.0 mmol/L - - 18.2  - -  Acid-base deficit 0.0 - 2.0 mmol/L - - 5.6  - -  O2 Saturation % - - 97.7  - -     Pulmonary Assessment Scores:  Pulmonary Assessment Scores     Row Name 07/15/23 1631         ADL UCSD   ADL Phase Exit     SOB Score total 47     Rest 0     Walk 3     Stairs 4     Bath 1     Dress 3     Shop 0       CAT Score   CAT Score 21       mMRC Score   mMRC Score 1              UCSD: Self-administered rating of dyspnea associated with activities of  daily living (ADLs) 6-point scale (0 = "not at all" to 5 = "maximal or unable to do because of breathlessness")  Scoring Scores range from 0 to 120.  Minimally important  difference is 5 units  CAT: CAT can identify the health impairment of COPD patients and is better correlated with disease progression.  CAT has a scoring range of zero to 40. The CAT score is classified into four groups of low (less than 10), medium (10 - 20), high (21-30) and very high (31-40) based on the impact level of disease on health status. A CAT score over 10 suggests significant symptoms.  A worsening CAT score could be explained by an exacerbation, poor medication adherence, poor inhaler technique, or progression of COPD or comorbid conditions.  CAT MCID is 2 points  mMRC: mMRC (Modified Medical Research Council) Dyspnea Scale is used to assess the degree of baseline functional disability in patients of respiratory disease due to dyspnea. No minimal important difference is established. A decrease in score of 1 point or greater is considered a positive change.   Pulmonary Function Assessment:  Pulmonary Function Assessment - 04/01/23 0946       Breath   Shortness of Breath Yes;Limiting activity             Exercise Target Goals: Exercise Program Goal: Individual exercise prescription set using results from initial 6 min walk test and THRR while considering  patient's activity barriers and safety.   Exercise Prescription Goal: Initial exercise prescription builds to 30-45 minutes a day of aerobic activity, 2-3 days per week.  Home exercise guidelines will be given to patient during program as part of exercise prescription that the participant will acknowledge.  Education: Aerobic Exercise: - Group verbal and visual presentation on the components of exercise prescription. Introduces F.I.T.T principle from ACSM for exercise prescriptions.  Reviews F.I.T.T. principles of aerobic exercise including progression.  Written material given at graduation. Flowsheet Row Pulmonary Rehab from 07/10/2023 in [facility] Cardiac and Pulmonary Rehab  Date 07/10/23  Educator NT  Instruction Review Code 1- Verbalizes Understanding       Education: Resistance Exercise: - Group verbal and visual presentation on the components of exercise prescription. Introduces F.I.T.T principle from ACSM for exercise prescriptions  Reviews F.I.T.T. principles of resistance exercise including progression. Written material given at graduation.    Education: Exercise & Equipment Safety: - Individual verbal instruction and demonstration of equipment use and safety with use of the equipment. Flowsheet Row Pulmonary Rehab from 07/10/2023 in [facility] Cardiac and Pulmonary Rehab  Date 04/01/23  Educator jh  Instruction Review Code 1- Verbalizes Understanding       Education: Exercise Physiology & General Exercise Guidelines: - Group verbal and written instruction with models to review the exercise physiology of the cardiovascular system and associated critical values. Provides general exercise guidelines with specific guidelines to those with heart or lung disease.  Flowsheet Row Pulmonary Rehab from 07/10/2023 in [facility] Cardiac and Pulmonary Rehab  Date 06/26/23  Educator NT  Instruction Review Code 1- [employer] Understanding       Education: Flexibility, Balance, Mind/Body Relaxation: - Group verbal and visual presentation with interactive activity on the components of exercise prescription. Introduces F.I.T.T principle from ACSM for exercise prescriptions. Reviews F.I.T.T. principles of flexibility and balance exercise training including progression. Also discusses the mind body connection.  Reviews various relaxation techniques to help reduce and manage stress (i.e. Deep breathing, progressive muscle relaxation, and visualization). Balance handout provided to take home. Written material given at graduation.   Activity Barriers & Risk  Stratification:  Activity Barriers & Cardiac Risk Stratification - 04/02/23 1431       Activity Barriers & Cardiac Risk Stratification  Activity Barriers Fibromyalgia;Muscular Weakness;Shortness of Breath;Assistive Device   Cane            6 Minute Walk:  6 Minute Walk     Row Name 04/02/23 1427 07/15/23 0944       6 Minute Walk   Phase Initial Discharge    Distance 1180 feet 1290 feet    Distance % Change -- 9.32 %    Distance Feet Change -- 110 ft    Walk Time 6 minutes 6 minutes    # of Rest Breaks 0 0    MPH 2.2 2.44    METS 3.23 3.18    RPE 17 9    Perceived Dyspnea  3 2    VO2 Peak 11.3 11.13    Symptoms No No    Resting HR 110 bpm 73 bpm    Resting BP 92/64 110/70    Resting Oxygen Saturation  95 % 98 %    Exercise Oxygen Saturation  during 6 min walk 89 % 94 %    Max Ex. HR 126 bpm 86 bpm    Max Ex. BP 106/64 122/80    2 Minute Post BP 90/64 110/80      Interval HR   1 Minute HR 116 77    2 Minute HR 126 84    3 Minute HR 125 86    4 Minute HR 121 84    5 Minute HR 125 85    6 Minute HR 126 86    2 Minute Post HR 118 71    Interval Heart Rate? Yes Yes      Interval Oxygen   Interval Oxygen? Yes Yes    Baseline Oxygen Saturation % 95 % 98 %    1 Minute Oxygen Saturation % 95 % 97 %    1 Minute Liters of Oxygen -- 0 L    2 Minute Oxygen Saturation % 93 % 95 %    2 Minute Liters of Oxygen -- 0 L    3 Minute Oxygen Saturation % 91 % 95 %    3 Minute Liters of Oxygen -- 0 L    4 Minute Oxygen Saturation % 89 % 96 %    4 Minute Liters of Oxygen -- 0 L    5 Minute Oxygen Saturation % 97 % 94 %    5 Minute Liters of Oxygen -- 0 L    6 Minute Oxygen Saturation % 98 % 96 %    6 Minute Liters of Oxygen -- 0 L    2 Minute Post Oxygen Saturation % 99 % 98 %            Oxygen Initial Assessment:  Oxygen Initial Assessment - 04/01/23 0946       Home Oxygen   Home Oxygen Device None    Sleep Oxygen Prescription None    Home Exercise Oxygen  Prescription None    Home Resting Oxygen Prescription None      Initial 6 min Walk   Oxygen Used None      Program Oxygen Prescription   Program Oxygen Prescription None      Intervention   Short Term Goals To learn and exhibit compliance with exercise, home and travel O2 prescription;To learn and understand importance of monitoring SPO2 with pulse oximeter and demonstrate accurate use of the pulse oximeter.;To learn and understand importance of maintaining oxygen saturations>88%;To learn and demonstrate proper pursed lip breathing techniques or other breathing techniques. ;To learn  and demonstrate proper use of respiratory medications    Long  Term Goals Exhibits compliance with exercise, home  and travel O2 prescription;Verbalizes importance of monitoring SPO2 with pulse oximeter and return demonstration;Maintenance of O2 saturations>88%;Exhibits proper breathing techniques, such as pursed lip breathing or other method taught during program session;Compliance with respiratory medication;Demonstrates proper use of MDI's             Oxygen Re-Evaluation:  Oxygen Re-Evaluation     Row Name 04/08/23 9147 04/29/23 0959 05/29/23 0929 06/12/23 0945 07/10/23 0941     Program Oxygen Prescription   Program Oxygen Prescription -- None None None None     Home Oxygen   Home Oxygen Device -- None None None None   Sleep Oxygen Prescription -- CPAP  Patient has recently been diagnosed with sleep apnea and is waiting for her CPAP machine to arrive. CPAP CPAP CPAP   Home Exercise Oxygen Prescription -- None None None None   Home Resting Oxygen Prescription -- None None None None   Compliance with Home Oxygen Use -- -- Yes Yes Yes     Goals/Expected Outcomes   Short Term Goals -- To learn and exhibit compliance with exercise, home and travel O2 prescription;To learn and understand importance of monitoring SPO2 with pulse oximeter and demonstrate accurate use of the pulse oximeter.;To learn and  understand importance of maintaining oxygen saturations>88%;To learn and demonstrate proper pursed lip breathing techniques or other breathing techniques. ;To learn and demonstrate proper use of respiratory medications Other Other To learn and exhibit compliance with exercise, home and travel O2 prescription;Other   Long  Term Goals -- Exhibits compliance with exercise, home  and travel O2 prescription;Verbalizes importance of monitoring SPO2 with pulse oximeter and return demonstration;Maintenance of O2 saturations>88%;Exhibits proper breathing techniques, such as pursed lip breathing or other method taught during program session;Compliance with respiratory medication;Demonstrates proper use of MDI's Other Other Exhibits compliance with exercise, home  and travel O2 prescription;Other   Comments Reviewed PLB technique with pt.  Talked about how it works and it's importance in maintaining their exercise saturations. Patient has recently completed a sleep study and has been diagnosed with sleep apnea. She is waiting for her CPAP machine to arrive. She will start using that as soon as she gets it. She does continue to say that her SOB is her main limiting factor with exercise and activity. She does have a pulse ox and moniotrs oxygen saturations at home. Analys is doing a sleep study next week. She is supposed to get a CPAP but only slept a few hours when she did the study. Informed her why it is important to use the machine for naps and sleep. Patient verbalizes understanding. Spoke about the importance of wearing her CPAP once it is delivered. She reports she understands and is optimistic it will help her get better sleeep Zamiyah had her sleep study and it showed apnea. She has a new machine with a full face mask and is able to use it. Informed her to use it at naps and for sleep. She has felt like it has made a difference since she started.   Goals/Expected Outcomes Short: Become more profiecient at using PLB.  Long: Become independent at using PLB. Short: obtain CPAP machine and start using it during sleep. Long: become independent with using PLB. Short: Do Sleep study. Long: Use CPAP machine routinely . STG: get CPAP and build habit of using it. LTG: Use CPAP routinely Short: use CPAP daily. Long:  maintain use of CPAP independently.            Oxygen Discharge (Final Oxygen Re-Evaluation):  Oxygen Re-Evaluation - 07/10/23 0941       Program Oxygen Prescription   Program Oxygen Prescription None      Home Oxygen   Home Oxygen Device None    Sleep Oxygen Prescription CPAP    Home Exercise Oxygen Prescription None    Home Resting Oxygen Prescription None    Compliance with Home Oxygen Use Yes      Goals/Expected Outcomes   Short Term Goals To learn and exhibit compliance with exercise, home and travel O2 prescription;Other    Long  Term Goals Exhibits compliance with exercise, home  and travel O2 prescription;Other    Comments Mckenley had her sleep study and it showed apnea. She has a new machine with a full face mask and is able to use it. Informed her to use it at naps and for sleep. She has felt like it has made a difference since she started.    Goals/Expected Outcomes Short: use CPAP daily. Long: maintain use of CPAP independently.             Initial Exercise Prescription:  Initial Exercise Prescription - 04/02/23 1400       Date of Initial Exercise RX and Referring Provider   Date 04/02/23    Referring Provider Dr. Alphonzo Dublin      Oxygen   Maintain Oxygen Saturation 88% or higher      Treadmill   MPH 2.2    Grade 0    Minutes 15    METs 2.68      Recumbant Bike   Level 2    RPM 50    Watts 25    Minutes 15    METs 3.23      NuStep   Level 2    SPM 80    Minutes 15    METs 3.23      Arm Ergometer   Level 1    Watts 25    Minutes 15    METs 3.23      T5 Nustep   Level 2    SPM 80    Minutes 15    METs 3.23      Biostep-RELP   Level 2     SPM 50    Minutes 15    METs 3.23      Track   Laps 41    Minutes 15    METs 3.23      Prescription Details   Duration Progress to 30 minutes of continuous aerobic without signs/symptoms of physical distress      Intensity   THRR 40-80% of Max Heartrate 131-152    Ratings of Perceived Exertion 11-13    Perceived Dyspnea 0-4      Progression   Progression Continue progressive overload as per policy without signs/symptoms or physical distress.      Resistance Training   Training Prescription Yes    Weight 3 lb    Reps 10-15             Perform Capillary Blood Glucose checks as needed.  Exercise Prescription Changes:   Exercise Prescription Changes     Row Name 04/02/23 1400 04/16/23 0800 05/01/23 1100 05/15/23 0900 05/27/23 1000     Response to Exercise   Blood Pressure (Admit) 92/64 106/70 108/82 106/62 --   Blood Pressure (Exercise) 106/64 118/72 132/62 140/64 --  Blood Pressure (Exit) 90/64 120/70 108/60 102/64 --   Heart Rate (Admit) 110 bpm 87 bpm 98 bpm 96 bpm --   Heart Rate (Exercise) 126 bpm 102 bpm 110 bpm 125 bpm --   Heart Rate (Exit) 118 bpm 103 bpm 95 bpm 85 bpm --   Oxygen Saturation (Admit) 95 % 96 % 94 % 97 % --   Oxygen Saturation (Exercise) 89 % 98 % 92 % 91 % --   Oxygen Saturation (Exit) 99 % 96 % 92 % 97 % --   Rating of Perceived Exertion (Exercise) 17 15 15 14  --   Perceived Dyspnea (Exercise) 3 3 3 1  --   Symptoms none none none none --   Comments results First full day of exercise First full day of exercise -- --   Duration -- Progress to 30 minutes of  aerobic without signs/symptoms of physical distress Progress to 30 minutes of  aerobic without signs/symptoms of physical distress Continue with 30 min of aerobic exercise without signs/symptoms of physical distress. Continue with 30 min of aerobic exercise without signs/symptoms of physical distress.   Intensity -- THRR unchanged THRR unchanged THRR unchanged THRR unchanged      Progression   Progression -- Continue to progress workloads to maintain intensity without signs/symptoms of physical distress. Continue to progress workloads to maintain intensity without signs/symptoms of physical distress. Continue to progress workloads to maintain intensity without signs/symptoms of physical distress. Continue to progress workloads to maintain intensity without signs/symptoms of physical distress.   Average METs -- 2.55 2.55 2 2     Resistance Training   Training Prescription -- Yes Yes Yes Yes   Weight -- 3 lb 3 lb 1 lb 1 lb   Reps -- 10-15 10-15 10-15 10-15     Interval Training   Interval Training -- No No No No     Oxygen   Oxygen -- -- Continuous -- --     Treadmill   MPH -- -- 2.2 1 1    Grade -- -- 0 0 0   Minutes -- -- 15 15 15    METs -- -- 2.69 1.77 1.77     Recumbant Bike   Level -- 2 -- 3 3   Watts -- 19 -- 19 19   Minutes -- 15 -- 15 15   METs -- 2.7 -- 2.71 2.71     NuStep   Level -- 3 -- -- --   Minutes -- 15 -- -- --   METs -- 2.4 -- -- --     T5 Nustep   Level -- -- 3 4 4    Minutes -- -- 15 15 15    METs -- -- 2 1.9 1.9     Biostep-RELP   Level -- -- -- 4 4   Minutes -- -- -- 15 15   METs -- -- -- 2 2     Home Exercise Plan   Plans to continue exercise at -- -- -- -- Home (comment)  walking and looking into a gym membership   Frequency -- -- -- -- Add 2 additional days to program exercise sessions.   Initial Home Exercises Provided -- -- -- -- 05/27/23     Oxygen   Maintain Oxygen Saturation -- 88% or higher 88% or higher 88% or higher 88% or higher    Row Name 05/28/23 0800 06/13/23 1700 06/25/23 1500 07/08/23 1400       Response to Exercise   Blood Pressure (Admit)  126/84 110/60 130/76 118/62    Blood Pressure (Exercise) 130/64 -- -- --    Blood Pressure (Exit) 114/62 122/70 110/64 106/60    Heart Rate (Admit) 70 bpm 76 bpm 83 bpm 76 bpm    Heart Rate (Exercise) 116 bpm 125 bpm 126 bpm 112 bpm    Heart Rate (Exit) 74 bpm  89 bpm 101 bpm 70 bpm    Oxygen Saturation (Admit) 96 % -- 96 % 92 %    Oxygen Saturation (Exercise) 92 % -- 93 % 93 %    Oxygen Saturation (Exit) 96 % -- 92 % 93 %    Rating of Perceived Exertion (Exercise) 15 16 13 12     Perceived Dyspnea (Exercise) 2 1 2 2     Symptoms none none none none    Duration Continue with 30 min of aerobic exercise without signs/symptoms of physical distress. Continue with 30 min of aerobic exercise without signs/symptoms of physical distress. Continue with 30 min of aerobic exercise without signs/symptoms of physical distress. Continue with 30 min of aerobic exercise without signs/symptoms of physical distress.    Intensity THRR unchanged THRR unchanged THRR unchanged THRR unchanged      Progression   Progression Continue to progress workloads to maintain intensity without signs/symptoms of physical distress. Continue to progress workloads to maintain intensity without signs/symptoms of physical distress. Continue to progress workloads to maintain intensity without signs/symptoms of physical distress. Continue to progress workloads to maintain intensity without signs/symptoms of physical distress.    Average METs 2.6 2.2 2.31 2.88      Resistance Training   Training Prescription Yes Yes Yes Yes    Weight 1 lb 1 lb 1 lb 1 lb    Reps 10-15 10-15 10-15 10-15      Interval Training   Interval Training No No No No      Treadmill   MPH -- 1 1.3 1.6    Grade -- 0 0 0    Minutes -- 15 15 15     METs -- 1.77 1.99 2.23      Recumbant Bike   Level 4 3 4 4     Watts 37 22 36 30    Minutes 15 15 15 15     METs 3.37 2.83 -- 3.11      NuStep   Level 4 4 3 5     Minutes 15 15 15 15     METs 2.8 1.8 2.3 --      T5 Nustep   Level 3 3 -- 4  T5 and T6    Minutes 15 15 -- 15    METs 2 1.8 -- 2      Biostep-RELP   Level -- 4 5 5     Minutes -- 15 15 15     METs -- 2 3 --      Home Exercise Plan   Plans to continue exercise at Home (comment)  walking and looking into  a gym membership Home (comment)  walking and looking into a gym membership Home (comment)  walking and looking into a gym membership Home (comment)  walking and looking into a gym membership    Frequency Add 2 additional days to program exercise sessions. Add 2 additional days to program exercise sessions. Add 2 additional days to program exercise sessions. Add 2 additional days to program exercise sessions.    Initial Home Exercises Provided 05/27/23 05/27/23 05/27/23 05/27/23      Oxygen   Maintain Oxygen Saturation 88% or higher  88% or higher 88% or higher 88% or higher             Exercise Comments:   Exercise Comments     Row Name 04/08/23 0981 07/15/23 1635         Exercise Comments First full day of exercise!  Patient was oriented to gym and equipment including functions, settings, policies, and procedures.  Patient's individual exercise prescription and treatment plan were reviewed.  All starting workloads were established based on the results of the 6 minute walk test done at initial orientation visit.  The plan for exercise progression was also introduced and progression will be customized based on patient's performance and goals. Jull graduated today from  rehab with 28 sessions completed.  Details of the patient's exercise prescription and what She needs to do in order to continue the prescription and progress were discussed with patient.  Patient was given a copy of prescription and goals.  Patient verbalized understanding. Valerye plans to continue to exercise by going to [facility] with her daughter.               Exercise Goals and Review:   Exercise Goals     Row Name 04/02/23 1435             Exercise Goals   Increase Physical Activity Yes       Intervention Provide advice, education, support and counseling about physical activity/exercise needs.;Develop an individualized exercise prescription for aerobic and resistive training based on initial evaluation findings,  risk stratification, comorbidities and participant's personal goals.       Expected Outcomes Long Term: Add in home exercise to make exercise part of routine and to increase amount of physical activity.;Long Term: Exercising regularly at least 3-5 days a week.;Short Term: Attend rehab on a regular basis to increase amount of physical activity.       Increase Strength and Stamina Yes       Intervention Develop an individualized exercise prescription for aerobic and resistive training based on initial evaluation findings, risk stratification, comorbidities and participant's personal goals.;Provide advice, education, support and counseling about physical activity/exercise needs.       Expected Outcomes Long Term: Improve cardiorespiratory fitness, muscular endurance and strength as measured by increased METs and functional capacity ( );Short Term: Perform resistance training exercises routinely during rehab and add in resistance training at home;Short Term: Increase workloads from initial exercise prescription for resistance, speed, and METs.       Able to understand and use rate of perceived exertion (RPE) scale Yes       Intervention Provide education and explanation on how to use RPE scale       Expected Outcomes Long Term:  Able to use RPE to guide intensity level when exercising independently;Short Term: Able to use RPE daily in rehab to express subjective intensity level       Able to understand and use Dyspnea scale Yes       Intervention Provide education and explanation on how to use Dyspnea scale       Expected Outcomes Long Term: Able to use Dyspnea scale to guide intensity level when exercising independently;Short Term: Able to use Dyspnea scale daily in rehab to express subjective sense of shortness of breath during exertion       Knowledge and understanding of Target Heart Rate Range (THRR) Yes       Intervention Provide education and explanation of THRR including how the numbers were  predicted and where they  are located for reference       Expected Outcomes Long Term: Able to use THRR to govern intensity when exercising independently;Short Term: Able to use daily as guideline for intensity in rehab;Short Term: Able to state/look up THRR       Able to check pulse independently Yes       Intervention Review the importance of being able to check your own pulse for safety during independent exercise;Provide education and demonstration on how to check pulse in carotid and radial arteries.       Expected Outcomes Long Term: Able to check pulse independently and accurately;Short Term: Able to explain why pulse checking is important during independent exercise       Understanding of Exercise Prescription Yes       Intervention Provide education, explanation, and written materials on patient's individual exercise prescription       Expected Outcomes Long Term: Able to explain home exercise prescription to exercise independently;Short Term: Able to explain program exercise prescription                Exercise Goals Re-Evaluation :  Exercise Goals Re-Evaluation     Row Name 04/08/23 0936 04/16/23 0817 05/01/23 1205 05/15/23 0933 05/27/23 1003     Exercise Goal Re-Evaluation   Exercise Goals Review Able to understand and use rate of perceived exertion (RPE) scale;Able to understand and use Dyspnea scale;Knowledge and understanding of Target Heart Rate Range (THRR);Understanding of Exercise Prescription Increase Physical Activity;Increase Strength and Stamina;Understanding of Exercise Prescription Increase Physical Activity;Increase Strength and Stamina;Understanding of Exercise Prescription Increase Physical Activity;Increase Strength and Stamina;Understanding of Exercise Prescription Increase Physical Activity;Able to understand and use rate of perceived exertion (RPE) scale;Knowledge and understanding of Target Heart Rate Range (THRR);Understanding of Exercise Prescription;Increase  Strength and Stamina;Able to understand and use Dyspnea scale;Able to check pulse independently   Comments Reviewed RPE and dyspnea scale, THR and program prescription with pt today.  Pt voiced understanding and was given a copy of goals to take home. Kristine is off to a good start in the program. She was able to work at level 3 on the T4 nustep and level 2 on the recumbent bike during her first day of exercise. She also did well with 3 lb hand weights for resistance training. We will continue to monitor her progress in the program. Violett is doing well in rehab. She was able to use the treadmill at a speed of 2.83mph. She was also able to use the T4 nustep at level 3. We will continue to monitor her progress in the program. Keaunna is doing well in rehab. She increased her level on the recumbent bike to 3 and increased her level to 4 on the T5 nustep and biostep. We will continue to monitor her progress in the program. Reviewed home exercise with pt today from 9:38am to 9:50am.  Pt plans to walk and look into possibly join a gym for exercise.  Reviewed THR, pulse, RPE, sign and symptoms, pulse oximetery and when to call 911 or MD.  Also discussed weather considerations and indoor options.  Pt voiced understanding.   Expected Outcomes Short: Use RPE daily to regulate intensity. Long: Follow program prescription in THR. Short: Continue to follow current exercise prescription. Long: Continue exercise to improve strength and stamina. Short: Continue to follow current exercise prescription. Long: Continue exercise to improve strength and stamina. Short: Continue to progressively increase workloads on the recumbent bike, T5 nustep, and biostep. Long: Continue exercise to improve  strength and stamina. Short: add 1-2 days a week of exercies at home on off days of rehab. Long: maintain independent exercise routine upon graduation from pulmonary rehab.    Row Name 05/28/23 7846 06/12/23 0947 06/13/23 1730 06/25/23 1517  07/08/23 1439     Exercise Goal Re-Evaluation   Exercise Goals Review Increase Physical Activity;Increase Strength and Stamina;Understanding of Exercise Prescription Increase Physical Activity;Increase Strength and Stamina;Understanding of Exercise Prescription Increase Physical Activity;Increase Strength and Stamina;Understanding of Exercise Prescription Increase Physical Activity;Increase Strength and Stamina;Understanding of Exercise Prescription Increase Physical Activity;Increase Strength and Stamina;Understanding of Exercise Prescription   Comments Joud is doing well in rehab. She has been able to increase her level on the recumbent bike from level 3 to 4. She was also able to increase to level 4 on the T4 nustep. We will continue to monitor her progress in the program. Merdith is on T4 currently on level 4. She reports she is doing well, feeling that she can push her self more than when she started. Enocuraged her to work towards exercising at home more, drinking water when she exercises will help with her water goal too. Sameka is doing well in rehab. She was not able to increase her workloads during this review, but was able to maintain them. She was able to use the treadmill at a speed of and no incline, and maintained level 3 on the T5 nustep and recumbent bike. We will continue to monitor her progress in the program. Nikkie is doing well in rehab. She increased her workload on the treadmill to a speed of 1.3 mph with no incline. She also increased to level 5 on the biostep and level 4 on the recumbent bike. We will continue to monitor her progress in the program. Shaunna is doing well in rehab. She increased her speed on the treadmill to 1.6 mph with no incline. She also increased her level on the T4 nustep to level 5 and to level 4 on the T5 nustep. We will continue to monitor her progress in the program.   Expected Outcomes Short: Continue to increase levels on the Recumbent bike, and T4 nustep.  Long: Continue exercise to improve strength and stamina. STG: work towards increasing workload at rehab and adding more home exercise. LTG: Continue to exercise to improve strength and stamina Short: Increase treadmill workloads. Long: Continue exercise to improve strength and stamina. Short: Continue to progressively increase treadmill, biostep, and recumbent bike workloads. Long: Continue exercise to improve strength and stamina. Short: Continue to progressively increase treadmill workload. Long: Continue exercise to improve strength and stamina.    Row Name 07/15/23 0950             Exercise Goal Re-Evaluation   Exercise Goals Review Increase Physical Activity;Increase Strength and Stamina;Understanding of Exercise Prescription       Comments Orlanda is graduating today as she is having surgery. She joined the [facility] for her exercise plan.       Expected Outcomes Short: Graduate. Long: Continue with exercise independently.                Discharge Exercise Prescription (Final Exercise Prescription Changes):  Exercise Prescription Changes - 07/08/23 1400       Response to Exercise   Blood Pressure (Admit) 118/62    Blood Pressure (Exit) 106/60    Heart Rate (Admit) 76 bpm    Heart Rate (Exercise) 112 bpm    Heart Rate (Exit) 70 bpm    Oxygen Saturation (  Admit) 92 %    Oxygen Saturation (Exercise) 93 %    Oxygen Saturation (Exit) 93 %    Rating of Perceived Exertion (Exercise) 12    Perceived Dyspnea (Exercise) 2    Symptoms none    Duration Continue with 30 min of aerobic exercise without signs/symptoms of physical distress.    Intensity THRR unchanged      Progression   Progression Continue to progress workloads to maintain intensity without signs/symptoms of physical distress.    Average METs 2.88      Resistance Training   Training Prescription Yes    Weight 1 lb    Reps 10-15      Interval Training   Interval Training No      Treadmill   MPH 1.6    Grade 0     Minutes 15    METs 2.23      Recumbant Bike   Level 4    Watts 30    Minutes 15    METs 3.11      NuStep   Level 5    Minutes 15      T5 Nustep   Level 4   T5 and T6   Minutes 15    METs 2      Biostep-RELP   Level 5    Minutes 15      Home Exercise Plan   Plans to continue exercise at Home (comment)   walking and looking into a gym membership   Frequency Add 2 additional days to program exercise sessions.    Initial Home Exercises Provided 05/27/23      Oxygen   Maintain Oxygen Saturation 88% or higher             Nutrition:  Target Goals: Understanding of nutrition guidelines, daily intake of sodium 1500mg , cholesterol 200mg , calories 30% from fat and 7% or less from saturated fats, daily to have 5 or more servings of fruits and vegetables.  Education: All About Nutrition: -Group instruction provided by verbal, written material, interactive activities, discussions, models, and posters to present general guidelines for heart healthy nutrition including fat, fiber, MyPlate, the role of sodium in heart healthy nutrition, utilization of the nutrition label, and utilization of this knowledge for meal planning. Follow up email sent as well. Written material given at graduation. Flowsheet Row Pulmonary Rehab from 07/10/2023 in [facility] Cardiac and Pulmonary Rehab  Date 05/08/23  Educator JG  Instruction Review Code 1- Verbalizes Understanding       Biometrics:  Pre Biometrics - 04/02/23 1435       Pre Biometrics   Height 5' 5.8" (1.671 m)    Weight 187 lb 1.6 oz (84.9 kg)    Waist Circumference 41 inches    Hip Circumference 44.5 inches    Waist to Hip Ratio 0.92 %    BMI (Calculated) 30.39    Single Leg Stand 7.15 seconds             Post Biometrics - 07/15/23 0947        Post  Biometrics   Height 5' 5.8" (1.671 m)    Weight 190 lb 12.8 oz (86.5 kg)    Waist Circumference 42 inches    Hip Circumference 44 inches    Waist to Hip Ratio 0.95 %    BMI  (Calculated) 31    Single Leg Stand 7.8 seconds             Nutrition Therapy Plan and Nutrition  Goals:  Nutrition Therapy & Goals - 05/13/23 1401       Nutrition Therapy   Diet carb controlled, mediterranean    Protein (specify units) 90    Fiber 25 grams    Whole Grain Foods 3 servings    Saturated Fats 15 max. grams    Fruits and Vegetables 5 servings/day    Sodium 2 grams      Personal Nutrition Goals   Nutrition Goal Drink less Mt dew and at least 1 bottle of water per day    Personal Goal #2 Eat a smaller breakfast with 15-30gProtein and 30-60gCarbs at each meal.    Personal Goal #3 Include more colorful produce, aim for 5-8 servings of fruits and veggies per day    Comments She doesn't drink any water, instead drinks mt dew. 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 Commended her on this accomplishment. Spoke with her about her A1C of 6.2%. Educated on carbs counting, target of 30-60g per meal. Went over different carb choices and how to improve the quality of carb choices. Encouraged more fruits and whole grains. Went over several small meal and snack ideas to help her be more consistent with her complex carb intake. Set goal to work on eating smaller breakfast with protein and complex carbs.      Intervention Plan   Intervention Prescribe, educate and counsel regarding individualized specific dietary modifications aiming towards targeted core components such as weight, hypertension, lipid management, diabetes, heart failure and other comorbidities.;Nutrition handout(s) given to patient.    Expected Outcomes Short Term Goal: Understand basic principles of dietary content, such as calories, fat, sodium, cholesterol and nutrients.;Short Term Goal: A plan has been developed with personal nutrition goals set during dietitian appointment.;Long Term Goal: Adherence to prescribed nutrition plan.             Nutrition Assessments:  MEDIFICTS Score Key: >=70 Need to make dietary changes   40-70 Heart Healthy Diet <= 40 Therapeutic Level Cholesterol Diet  Flowsheet Row Pulmonary Rehab from 07/15/2023 in [facility] Cardiac and Pulmonary Rehab  Picture Your Plate Total Score on Admission 52  Picture Your Plate Total Score on Discharge 50      Picture Your Plate Scores: <16 Unhealthy dietary pattern with much room for improvement. 41-50 Dietary pattern unlikely to meet recommendations for good health and room for improvement. 51-60 More healthful dietary pattern, with some room for improvement.  >60 Healthy dietary pattern, although there may be some specific behaviors that could be improved.   Nutrition Goals Re-Evaluation:  Nutrition Goals Re-Evaluation     Row Name 04/29/23 1004 06/12/23 0940           Goals   Comment Patient has not met with RD yet. She is interested in meeting for a nutrition appointment. Appointment was scheduled for one on one RD apt on 05/06/2023 She is still working on reducing mt dew and replacing it with water. Refocused goal towards increasing water to at least 1 bottle per day. She reports she has been working on the goal of eating smaller breakfast.      Expected Outcome Short: meet with RD and set specific nutrition goals. Long: maintain heart healthy eating habits. STG: drink 1 bottle of water per day. LTG: maintain a hearth healthy lifestyle               Nutrition Goals Discharge (Final Nutrition Goals Re-Evaluation):  Nutrition Goals Re-Evaluation - 06/12/23 0940       Goals  Comment She is still working on reducing mt dew and replacing it with water. Refocused goal towards increasing water to at least 1 bottle per day. She reports she has been working on the goal of eating smaller breakfast.    Expected Outcome STG: drink 1 bottle of water per day. LTG: maintain a hearth healthy lifestyle             Psychosocial: Target Goals: Acknowledge presence or absence of significant depression and/or stress, maximize coping skills,  provide positive support system. Participant is able to verbalize types and ability to use techniques and skills needed for reducing stress and depression.   Education: Stress, Anxiety, and Depression - Group verbal and visual presentation to define topics covered.  Reviews how body is impacted by stress, anxiety, and depression.  Also discusses healthy ways to reduce stress and to treat/manage anxiety and depression.  Written material given at graduation. Flowsheet Row Pulmonary Rehab from 07/10/2023 in [facility] Cardiac and Pulmonary Rehab  Date 06/19/23  Educator SB  Instruction Review Code 1- [employer] Understanding       Education: Sleep Hygiene -Provides group verbal and written instruction about how sleep can affect your health.  Define sleep hygiene, discuss sleep cycles and impact of sleep habits. Review good sleep hygiene tips.    Initial Review & Psychosocial Screening:  Initial Psych Review & Screening - 04/01/23 0948       Initial Review   Current issues with Current Sleep Concerns      Family Dynamics   Good Support System? Yes    Comments She can look to her room mate and best friends for support. Sometimes she does not sleep well do to possible sleep apnea. Samiya goes to a [place] group therapy for recovery.      Barriers   Psychosocial barriers to participate in program The patient should benefit from training in stress management and relaxation.      Screening Interventions   Interventions To provide support and resources with identified psychosocial needs;Encouraged to exercise;Provide feedback about the scores to participant    Expected Outcomes Short Term goal: Utilizing psychosocial counselor, staff and physician to assist with identification of specific Stressors or current issues interfering with healing process. Setting desired goal for each stressor or current issue identified.;Long Term Goal: Stressors or current issues are controlled or eliminated.;Short Term  goal: Identification and review with participant of any Quality of Life or Depression concerns found by scoring the questionnaire.;Long Term goal: The participant improves quality of Life and PHQ9 Scores as seen by post scores and/or verbalization of changes             Quality of Life Scores:  Scores of 19 and below usually indicate a poorer quality of life in these areas.  A difference of  2-3 points is a clinically meaningful difference.  A difference of 2-3 points in the total score of the Quality of Life Index has been associated with significant improvement in overall quality of life, self-image, physical symptoms, and general health in studies assessing change in quality of life.  PHQ-9: Review Flowsheet       07/15/2023 04/02/2023 07/07/2015 04/04/2015  Depression screen PHQ 2/9  Decreased Interest 0 0 0 0  Down, Depressed, Hopeless 0 0 0 0  PHQ - 2 Score 0 0 0 0  Altered sleeping 0 2 - -  Tired, decreased energy 1 1 - -  Change in appetite 0 0 - -  Feeling bad or failure  about yourself  0 0 - -  Trouble concentrating 0 1 - -  Moving slowly or fidgety/restless 0 0 - -  Suicidal thoughts 0 0 - -  PHQ-9 Score 1 4 - -  Difficult doing work/chores Not difficult at all Somewhat difficult - -   Interpretation of Total Score  Total Score Depression Severity:  1-4 = Minimal depression, 5-9 = Mild depression, 10-14 = Moderate depression, 15-19 = Moderately severe depression, 20-27 = Severe depression   Psychosocial Evaluation and Intervention:  Psychosocial Evaluation - 04/01/23 0951       Psychosocial Evaluation & Interventions   Interventions Relaxation education;Stress management education;Encouraged to exercise with the program and follow exercise prescription    Comments She can look to her room mate and best friends for support. Sometimes she does not sleep well do to possible sleep apnea. Adileny goes to a [place] group therapy for recovery.    Expected Outcomes Short:  Start [facility] to help with mood. Long: Maintain a healthy mental state.    Continue Psychosocial Services  Follow up required by staff             Psychosocial Re-Evaluation:  Psychosocial Re-Evaluation     Row Name 04/29/23 1003 05/29/23 0933 06/12/23 0937         Psychosocial Re-Evaluation   Current issues with Current Sleep Concerns Current Sleep Concerns Current Stress Concerns     Comments Patient has recently completed a sleep study and has been diagnosed with sleep apnea. She is waiting for her CPAP machine to arrive. She will start using that as soon as she gets it. She does continue to say that her SOB is her main limiting factor with exercise and activity. Patient reports no issues with their current mental states, sleep, stress, depression or anxiety. Will follow up with patient in a few weeks for any changes. She reports no concerns with stress, anxiety, or depression at this time. Says she has a good support system. She is working with her medical team to imporve her sleep. says some medications have helped her sleep better the past week. She is also expecting a CPAP to be delivered in the next week or two. Overall, her sleep is improving.     Expected Outcomes Short: obtain CPAP machine and start using it to aid in sleep quality. Long: maintain good sleep patterns long term. Short: Continue to exercise regularly to support mental health and notify staff of any changes. Long: maintain mental health and well being through teaching of rehab or prescribed medications independently. STG: Continue to focus on sleeping well, and use CPAP once delivered. LTG: maintain positive outlook on health and daily life     Interventions Encouraged to attend Pulmonary Rehabilitation for the exercise Encouraged to attend Pulmonary Rehabilitation for the exercise Encouraged to attend Pulmonary Rehabilitation for the exercise     Continue Psychosocial Services  Follow up required by staff Follow up  required by staff Follow up required by staff              Psychosocial Discharge (Final Psychosocial Re-Evaluation):  Psychosocial Re-Evaluation - 06/12/23 0937       Psychosocial Re-Evaluation   Current issues with Current Stress Concerns    Comments She reports no concerns with stress, anxiety, or depression at this time. Says she has a good support system. She is working with her medical team to imporve her sleep. says some medications have helped her sleep better the past week. She is  also expecting a CPAP to be delivered in the next week or two. Overall, her sleep is improving.    Expected Outcomes STG: Continue to focus on sleeping well, and use CPAP once delivered. LTG: maintain positive outlook on health and daily life    Interventions Encouraged to attend Pulmonary Rehabilitation for the exercise    Continue Psychosocial Services  Follow up required by staff             Education: Education Goals: Education classes will be provided on a weekly basis, covering required topics. Participant will state understanding/return demonstration of topics presented.  Learning Barriers/Preferences:  Learning Barriers/Preferences - 04/01/23 0946       Learning Barriers/Preferences   Learning Barriers None    Learning Preferences None             General Pulmonary Education Topics:  Infection Prevention: - Provides verbal and written material to individual with discussion of infection control including proper hand washing and proper equipment cleaning during exercise session. Flowsheet Row Pulmonary Rehab from 07/10/2023 in [facility] Cardiac and Pulmonary Rehab  Date 04/01/23  Educator [facility]  Instruction Review Code 1- Verbalizes Understanding       Falls Prevention: - Provides verbal and written material to individual with discussion of falls prevention and safety. Flowsheet Row Pulmonary Rehab from 07/10/2023 in [facility] Cardiac and Pulmonary Rehab  Date 04/01/23  Educator [facility]   Instruction Review Code 1- Verbalizes Understanding       Chronic Lung Disease Review: - Group verbal instruction with posters, models, PowerPoint presentations and videos,  to review new updates, new respiratory medications, new advancements in procedures and treatments. Providing information on websites and "800" numbers for continued self-education. Includes information about supplement oxygen, available portable oxygen systems, continuous and intermittent flow rates, oxygen safety, concentrators, and Medicare reimbursement for oxygen. Explanation of Pulmonary Drugs, including class, frequency, complications, importance of spacers, rinsing mouth after steroid MDI's, and proper cleaning methods for nebulizers. Review of basic lung anatomy and physiology related to function, structure, and complications of lung disease. Review of risk factors. Discussion about methods for diagnosing sleep apnea and types of masks and machines for OSA. Includes a review of the use of types of environmental controls: home humidity, furnaces, filters, dust mite/pet prevention, HEPA vacuums. Discussion about weather changes, air quality and the benefits of nasal washing. Instruction on Warning signs, infection symptoms, calling MD promptly, preventive modes, and value of vaccinations. Review of effective airway clearance, coughing and/or vibration techniques. Emphasizing that all should Create an Action Plan. Written material given at graduation. Flowsheet Row Pulmonary Rehab from 07/10/2023 in [facility] Cardiac and Pulmonary Rehab  Date 06/12/23  Educator [facility]  Instruction Review Code 1- Verbalizes Understanding       AED/CPR: - Group verbal and written instruction with the use of models to demonstrate the basic use of the AED with the basic ABC's of resuscitation.    Anatomy and Cardiac Procedures: - Group verbal and visual presentation and models provide information about basic cardiac anatomy and function. Reviews  the testing methods done to diagnose heart disease and the outcomes of the test results. Describes the treatment choices: Medical Management, Angioplasty, or Coronary Bypass Surgery for treating various heart conditions including Myocardial Infarction, Angina, Valve Disease, and Cardiac Arrhythmias.  Written material given at graduation.   Medication Safety: - Group verbal and visual instruction to review commonly prescribed medications for heart and lung disease. Reviews the medication, class of the drug, and side effects. Includes  the steps to properly store meds and maintain the prescription regimen.  Written material given at graduation. Flowsheet Row Pulmonary Rehab from 07/10/2023 in [facility] Cardiac and Pulmonary Rehab  Date 05/29/23  Educator [facility]  Instruction Review Code 1- Verbalizes Understanding       Other: -Provides group and verbal instruction on various topics (see comments)   Knowledge Questionnaire Score:  Knowledge Questionnaire Score - 07/15/23 1628       Knowledge Questionnaire Score   Post Score 11/18              Core Components/Risk Factors/Patient Goals at Admission:  Personal Goals and Risk Factors at Admission - 04/01/23 0946       Core Components/Risk Factors/Patient Goals on Admission    Weight Management Yes    Intervention Weight Management: Develop a combined nutrition and exercise program designed to reach desired caloric intake, while maintaining appropriate intake of nutrient and fiber, sodium and fats, and appropriate energy expenditure required for the weight goal.;Weight Management: Provide education and appropriate resources to help participant work on and attain dietary goals.;Weight Management/Obesity: Establish reasonable short term and long term weight goals.    Expected Outcomes Long Term: Adherence to nutrition and physical activity/exercise program aimed toward attainment of established weight goal;Short Term: Continue to assess and modify  interventions until short term weight is achieved;Weight Loss: Understanding of general recommendations for a balanced deficit meal plan, which promotes 1-2 lb weight loss per week and includes a negative energy balance of [phone] kcal/d;Understanding recommendations for meals to include 15-35% energy as protein, 25-35% energy from fat, 35-60% energy from carbohydrates, less than 200mg  of dietary cholesterol, 20-35 gm of total fiber daily;Understanding of distribution of calorie intake throughout the day with the consumption of 4-5 meals/snacks    Improve shortness of breath with ADL's Yes    Intervention Provide education, individualized exercise plan and daily activity instruction to help decrease symptoms of SOB with activities of daily living.    Expected Outcomes Short Term: Improve cardiorespiratory fitness to achieve a reduction of symptoms when performing ADLs;Long Term: Be able to perform more ADLs without symptoms or delay the onset of symptoms    Diabetes Yes    Intervention Provide education about signs/symptoms and action to take for hypo/hyperglycemia.;Provide education about proper nutrition, including hydration, and aerobic/resistive exercise prescription along with prescribed medications to achieve blood glucose in normal ranges: Fasting glucose 65-99 mg/dL    Expected Outcomes Short Term: Participant verbalizes understanding of the signs/symptoms and immediate care of hyper/hypoglycemia, proper foot care and importance of medication, aerobic/resistive exercise and nutrition plan for blood glucose control.;Long Term: Attainment of HbA1C < 7%.    Hypertension Yes    Intervention Provide education on lifestyle modifcations including regular physical activity/exercise, weight management, moderate sodium restriction and increased consumption of fresh fruit, vegetables, and low fat dairy, alcohol moderation, and smoking cessation.;Monitor prescription use compliance.    Expected Outcomes Short  Term: Continued assessment and intervention until BP is < 140/65mm HG in hypertensive participants. < 130/47mm HG in hypertensive participants with diabetes, heart failure or chronic kidney disease.;Long Term: Maintenance of blood pressure at goal levels.    Lipids Yes    Intervention Provide education and support for participant on nutrition & aerobic/resistive exercise along with prescribed medications to achieve LDL 70mg , HDL >40mg .    Expected Outcomes Short Term: Participant states understanding of desired cholesterol values and is compliant with medications prescribed. Participant is following exercise prescription and nutrition guidelines.;Long Term: Cholesterol  controlled with medications as prescribed, with individualized exercise RX and with personalized nutrition plan. Value goals: LDL < 70mg , HDL > 40 mg.             Education:Diabetes - Individual verbal and written instruction to review signs/symptoms of diabetes, desired ranges of glucose level fasting, after meals and with exercise. Acknowledge that pre and post exercise glucose checks will be done for 3 sessions at entry of program. Flowsheet Row Pulmonary Rehab from 07/10/2023 in [facility] Cardiac and Pulmonary Rehab  Date 04/01/23  Educator [facility]  Instruction Review Code 1- Verbalizes Understanding       Know Your Numbers and Heart Failure: - Group verbal and visual instruction to discuss disease risk factors for cardiac and pulmonary disease and treatment options.  Reviews associated critical values for Overweight/Obesity, Hypertension, Cholesterol, and Diabetes.  Discusses basics of heart failure: signs/symptoms and treatments.  Introduces Heart Failure Zone chart for action plan for heart failure.  Written material given at graduation. Flowsheet Row Pulmonary Rehab from 07/10/2023 in [facility] Cardiac and Pulmonary Rehab  Date 06/05/23  Educator [facility]  Instruction Review Code 1- Verbalizes Understanding       Core Components/Risk  Factors/Patient Goals Review:   Goals and Risk Factor Review     Row Name 04/29/23 [phone] 05/29/23 0934 06/12/23 0942 07/10/23 0945       Core Components/Risk Factors/Patient Goals Review   Personal Goals Review Diabetes;Hypertension Improve shortness of breath with ADL's Improve shortness of breath with ADL's Other    Review Patient reported that her doctor had prescribed medication to help control blood sugars but she has not been taking it. She has been trying to manage her blood sugars with nutrion. She has not told her doctor this and she was encouraged to call her doctor inform them that she has not been taking her DM meds and to discuss options with her care plan. She does have a blood pressure cuff at home but does not consistently use it. She was encouraged to start checking her blood pressure at home. She does take her BP meds. Spoke to patient about their shortness of breath and what they can do to improve. Patient has been informed of breathing techniques when starting the program. Patient is informed to tell staff if they have had any med changes and that certain meds they are taking or not taking can be causing shortness of breath. Patient reports she is doing much better with ADLs and says her shortness of breath is much improved. Reviewed breathing techniques and encouraged her to continue to attend [facility] rehab. Jayliani is going to be done with the program on Monday. She is going to have Bronchialthermalplasty. She states her doctor wants her to return to rehab after she heals from surgery.    Expected Outcomes Short: call doctor to discuss diabetes medicaiton concerns. Start checking BP at home. Long: work with medical team to control cardiac risk factors Short: Attend [facility] regularly to improve shortness of breath with ADL's. Long: maintain independence with ADL's STG: attend [facility], increase workload as able. LTG: maintain independence with ADLs Short: Graduate [facility]. Long:  retrun to [employer] post surgery.             Core Components/Risk Factors/Patient Goals at Discharge (Final Review):   Goals and Risk Factor Review - 07/10/23 0945       Core Components/Risk Factors/Patient Goals Review   Personal Goals Review Other    Review Melena is going to be done with  the program on Monday. She is going to have Bronchialthermalplasty. She states her doctor wants her to return to rehab after she heals from surgery.    Expected Outcomes Short: Graduate [facility]. Long: retrun to [employer] post surgery.             ITP Comments:  ITP Comments     Row Name 04/01/23 0945 04/02/23 1426 04/08/23 0936 04/10/23 0937 05/08/23 1245   ITP Comments Virtual Visit completed. Patient informed on EP and RD appointment and 6 Minute walk test. Patient also informed of patient health questionnaires on My Chart. Patient Verbalizes understanding. Visit diagnosis can be found in [facility] 01/13/2023. Completed and gym orientation. Initial ITP created and sent for review to Dr. Jinny Sanders, Medical Director. First full day of exercise!  Patient was oriented to gym and equipment including functions, settings, policies, and procedures.  Patient's individual exercise prescription and treatment plan were reviewed.  All starting workloads were established based on the results of the 6 minute walk test done at initial orientation visit.  The plan for exercise progression was also introduced and progression will be customized based on patient's performance and goals. 30 Day review completed. Medical Director ITP review done, changes made as directed, and signed approval by Medical Director.    new to program 30 Day review completed. Medical Director ITP review done, changes made as directed, and signed approval by Medical Director.    Row Name 06/05/23 0725 07/03/23 0748 07/15/23 1635       ITP Comments 30 Day review completed. Medical Director ITP review done, changes made as directed, and  signed approval by Medical Director. 30 Day review completed. Medical Director ITP review done, changes made as directed, and signed approval by Medical Director. Taci graduated today from  rehab with 28 sessions completed.  Details of the patient's exercise prescription and what She needs to do in order to continue the prescription and progress were discussed with patient.  Patient was given a copy of prescription and goals.  Patient verbalized understanding. Nazariah plans to continue to exercise by going to [facility] with her daughter.              Comments: Discharge ITP

## 2023-07-15 NOTE — Progress Notes (Deleted)
 Daily Session Note  Patient Details  Name: Jenna Tyler MRN: [id number] Date of Birth: Nov 28, 1965 Referring Provider:   Flowsheet Row Pulmonary Rehab from 04/02/2023 in [facility] Cardiac and Pulmonary Rehab  Referring Provider Dr. Alphonzo Dublin       Encounter Date: 07/15/2023  Check In:  Session Check In - 07/15/23 0950       Check-In   Supervising physician immediately available to respond to emergencies See telemetry face sheet for immediately available ER MD    Location [facility]-Cardiac & Pulmonary Rehab    Staff Present Rory Percy, MS, Exercise Physiologist;Kelly Madilyn Fireman BS, ACSM CEP, Exercise Physiologist;Maxon Conetta BS, Exercise Physiologist;Holman Bonsignore, RN, BSN, CCRP    Virtual Visit No    Medication changes reported     No    Fall or balance concerns reported    No    Warm-up and Cool-down Performed on first and last piece of equipment    Resistance Training Performed Yes    VAD Patient? No    PAD/SET Patient? No      Pain Assessment   Currently in Pain? No/denies                Social History   Tobacco Use  Smoking Status Former   Current packs/day: 0.00   Average packs/day: 1 pack/day for 15.0 years (15.0 ttl pk-yrs)   Types: Cigarettes   Start date: 12/29/1982   Quit date: 12/28/1997   Years since quitting: 25.5  Smokeless Tobacco Never    Goals Met:  Proper associated with RPD/PD & O2 Sat Independence with exercise equipment Exercise tolerated well No report of concerns or symptoms today  Goals Unmet:  Not Applicable  Comments: Pt able to follow exercise prescription today without complaint.  Will continue to monitor for progression.    Dr. Bethann Punches is Medical Director for [facility] Cardiac Rehabilitation.  Dr. Vida Rigger is Medical Director for [facility] Pulmonary Rehabilitation.

## 2023-07-15 NOTE — Progress Notes (Signed)
 Discharge Note for  Jenna Tyler [facility]     12/02/1965                    6 Minute Walk     Row Name 04/02/23 1427 07/15/23 0944       6 Minute Walk   Phase Initial Discharge    Distance 1180 feet 1290 feet    Distance % Change -- 9.32 %    Distance Feet Change -- 110 ft    Walk Time 6 minutes 6 minutes    # of Rest Breaks 0 0    MPH 2.2 2.44    METS 3.23 3.18    RPE 17 9    Perceived Dyspnea  3 2    VO2 Peak 11.3 11.13    Symptoms No No    Resting HR 110 bpm 73 bpm    Resting BP 92/64 110/70    Resting Oxygen Saturation  95 % 98 %    Exercise Oxygen Saturation  during 6 min walk 89 % 94 %    Max Ex. HR 126 bpm 86 bpm    Max Ex. BP 106/64 122/80    2 Minute Post BP 90/64 110/80      Interval HR   1 Minute HR 116 77    2 Minute HR 126 84    3 Minute HR 125 86    4 Minute HR 121 84    5 Minute HR 125 85    6 Minute HR 126 86    2 Minute Post HR 118 71    Interval Heart Rate? Yes Yes      Interval Oxygen   Interval Oxygen? Yes Yes    Baseline Oxygen Saturation % 95 % 98 %    1 Minute Oxygen Saturation % 95 % 97 %    1 Minute Liters of Oxygen -- 0 L    2 Minute Oxygen Saturation % 93 % 95 %    2 Minute Liters of Oxygen -- 0 L    3 Minute Oxygen Saturation % 91 % 95 %    3 Minute Liters of Oxygen -- 0 L    4 Minute Oxygen Saturation % 89 % 96 %    4 Minute Liters of Oxygen -- 0 L    5 Minute Oxygen Saturation % 97 % 94 %    5 Minute Liters of Oxygen -- 0 L    6 Minute Oxygen Saturation % 98 % 96 %    6 Minute Liters of Oxygen -- 0 L    2 Minute Post Oxygen Saturation % 99 % 98 %

## 2023-07-15 NOTE — Progress Notes (Signed)
 Daily Session Note  Patient Details  Name: Merleen Picazo MRN: [id number] Date of Birth: May 29, 1965 Referring Provider:   Flowsheet Row Pulmonary Rehab from 04/02/2023 in [facility] Cardiac and Pulmonary Rehab  Referring Provider Dr. Alphonzo Dublin       Encounter Date: 07/15/2023  Check In:  Session Check In - 07/15/23 0950       Check-In   Supervising physician immediately available to respond to emergencies See telemetry face sheet for immediately available ER MD    Location [facility]-Cardiac & Pulmonary Rehab    Staff Present Rory Percy, MS, Exercise Physiologist;Kelly Madilyn Fireman BS, ACSM CEP, Exercise Physiologist;Maxon Conetta BS, Exercise Physiologist;Diago Haik, RN, BSN, CCRP    Virtual Visit No    Medication changes reported     No    Fall or balance concerns reported    No    Warm-up and Cool-down Performed on first and last piece of equipment    Resistance Training Performed Yes    VAD Patient? No    PAD/SET Patient? No      Pain Assessment   Currently in Pain? No/denies                Social History   Tobacco Use  Smoking Status Former   Current packs/day: 0.00   Average packs/day: 1 pack/day for 15.0 years (15.0 ttl pk-yrs)   Types: Cigarettes   Start date: 12/29/1982   Quit date: 12/28/1997   Years since quitting: 25.5  Smokeless Tobacco Never    Goals Met:  Proper associated with RPD/PD & O2 Sat Independence with exercise equipment Exercise tolerated well Personal goals reviewed No report of concerns or symptoms today  Goals Unmet:  Not Applicable  Comments: Pt able to follow exercise prescription today without complaint.   Yvanna graduated today from  rehab with 28 sessions completed.  Details of the patient's exercise prescription and what She needs to do in order to continue the prescription and progress were discussed with patient.  Patient was given a copy of prescription and goals.  Patient verbalized understanding. Eniola plans to continue to  exercise by going to [facility] with her daughter.   Dr. Bethann Punches is Medical Director for [facility] Cardiac Rehabilitation.  Dr. Vida Rigger is Medical Director for [facility] Pulmonary Rehabilitation.

## 2023-07-17 ENCOUNTER — Encounter

## 2023-07-17 HISTORY — PX: BRONCHIAL DILITATION: SHX5107

## 2023-07-19 ENCOUNTER — Encounter

## 2023-07-22 ENCOUNTER — Encounter

## 2023-07-24 ENCOUNTER — Encounter

## 2023-08-13 ENCOUNTER — Encounter: Payer: Self-pay | Admitting: Emergency Medicine

## 2023-08-13 ENCOUNTER — Observation Stay
Admission: EM | Admit: 2023-08-13 | Discharge: 2023-08-14 | Disposition: A | Discharge status: Home / Self Care | Attending: Internal Medicine | Admitting: Internal Medicine

## 2023-08-13 ENCOUNTER — Emergency Department

## 2023-08-13 ENCOUNTER — Other Ambulatory Visit: Payer: Self-pay

## 2023-08-13 DIAGNOSIS — Z79899 Other long term (current) drug therapy: Secondary | ICD-10-CM | POA: Diagnosis not present

## 2023-08-13 DIAGNOSIS — J690 Pneumonitis due to inhalation of food and vomit: Secondary | ICD-10-CM | POA: Diagnosis present

## 2023-08-13 DIAGNOSIS — D751 Secondary polycythemia: Secondary | ICD-10-CM | POA: Insufficient documentation

## 2023-08-13 DIAGNOSIS — I10 Essential (primary) hypertension: Secondary | ICD-10-CM | POA: Insufficient documentation

## 2023-08-13 DIAGNOSIS — E669 Obesity, unspecified: Secondary | ICD-10-CM | POA: Diagnosis not present

## 2023-08-13 DIAGNOSIS — Z87891 Personal history of nicotine dependence: Secondary | ICD-10-CM | POA: Diagnosis not present

## 2023-08-13 DIAGNOSIS — Z6832 Body mass index (BMI) 32.0-32.9, adult: Secondary | ICD-10-CM | POA: Insufficient documentation

## 2023-08-13 DIAGNOSIS — E119 Type 2 diabetes mellitus without complications: Secondary | ICD-10-CM | POA: Insufficient documentation

## 2023-08-13 DIAGNOSIS — E039 Hypothyroidism, unspecified: Secondary | ICD-10-CM | POA: Insufficient documentation

## 2023-08-13 DIAGNOSIS — J189 Pneumonia, unspecified organism: Principal | ICD-10-CM | POA: Diagnosis present

## 2023-08-13 LAB — BASIC METABOLIC PANEL WITH GFR
Anion gap: 10 (ref 5–15)
BUN: 12 mg/dL (ref 6–20)
CO2: 23 mmol/L (ref 22–32)
Calcium: 9.5 mg/dL (ref 8.9–10.3)
Chloride: 107 mmol/L (ref 98–111)
Creatinine, Ser: 1.04 mg/dL — ABNORMAL HIGH (ref 0.44–1.00)
GFR, Estimated: >60 mL/min (ref >60)
Glucose, Bld: 126 mg/dL — ABNORMAL HIGH (ref 70–99)
Potassium: 3.9 mmol/L (ref 3.5–5.1)
Sodium: 140 mmol/L (ref 135–145)

## 2023-08-13 LAB — CBC
HCT: 47.2 % — ABNORMAL HIGH (ref 36.0–46.0)
Hemoglobin: 16.2 g/dL — ABNORMAL HIGH (ref 12.0–15.0)
MCH: 29.2 pg (ref 26.0–34.0)
MCHC: 34.3 g/dL (ref 30.0–36.0)
MCV: 85 fL (ref 80.0–100.0)
Platelets: 348 10*3/uL (ref 150–400)
RBC: 5.55 MIL/uL — ABNORMAL HIGH (ref 3.87–5.11)
RDW: 13.5 % (ref 11.5–15.5)
WBC: 8.9 10*3/uL (ref 4.0–10.5)
nRBC: 0 % (ref 0.0–0.2)

## 2023-08-13 LAB — HEMOGLOBIN A1C
Hgb A1c MFr Bld: 5.7 % — ABNORMAL HIGH (ref 4.8–5.6)
Mean Plasma Glucose: 116.89 mg/dL

## 2023-08-13 LAB — LACTIC ACID, PLASMA
Lactic Acid, Venous: 1.2 mmol/L (ref 0.5–1.9)
Lactic Acid, Venous: 2.1 mmol/L (ref 0.5–1.9)

## 2023-08-13 LAB — PROCALCITONIN: Procalcitonin: <0.1 ng/mL

## 2023-08-13 LAB — GLUCOSE, CAPILLARY
Glucose-Capillary: 100 mg/dL — ABNORMAL HIGH (ref 70–99)
Glucose-Capillary: 126 mg/dL — ABNORMAL HIGH (ref 70–99)
Glucose-Capillary: 189 mg/dL — ABNORMAL HIGH (ref 70–99)

## 2023-08-13 MED ORDER — PANTOPRAZOLE SODIUM 40 MG PO TBEC
40.0000 mg | DELAYED_RELEASE_TABLET | Freq: Every day | ORAL | Status: DC
  Administered 2023-08-13 – 2023-08-14 (×2): 40 mg via ORAL
  Filled 2023-08-13 (×2): qty 1

## 2023-08-13 MED ORDER — VANCOMYCIN HCL IN DEXTROSE 1-5 GM/200ML-% IV SOLN
1000.0000 mg | Freq: Once | INTRAVENOUS | Status: AC
  Administered 2023-08-13: 1000 mg via INTRAVENOUS
  Filled 2023-08-13: qty 200

## 2023-08-13 MED ORDER — ONDANSETRON HCL 4 MG PO TABS
8.0000 mg | ORAL_TABLET | Freq: Two times a day (BID) | ORAL | Status: DC
  Administered 2023-08-13 – 2023-08-14 (×3): 8 mg via ORAL
  Filled 2023-08-13 (×4): qty 2

## 2023-08-13 MED ORDER — METOPROLOL TARTRATE 25 MG PO TABS
25.0000 mg | ORAL_TABLET | ORAL | Status: DC
  Administered 2023-08-13 – 2023-08-14 (×2): 25 mg via ORAL
  Filled 2023-08-13 (×2): qty 1

## 2023-08-13 MED ORDER — PSEUDOEPHEDRINE HCL 15 MG/5ML PO LIQD
60.0000 mg | Freq: Two times a day (BID) | ORAL | Status: DC | PRN
  Filled 2023-08-13: qty 20

## 2023-08-13 MED ORDER — LEVOFLOXACIN IN D5W 750 MG/150ML IV SOLN
750.0000 mg | INTRAVENOUS | Status: DC
  Administered 2023-08-13 – 2023-08-14 (×2): 750 mg via INTRAVENOUS
  Filled 2023-08-13 (×2): qty 150

## 2023-08-13 MED ORDER — INSULIN ASPART 100 UNIT/ML IJ SOLN
0.0000 [IU] | Freq: Three times a day (TID) | INTRAMUSCULAR | Status: DC
Start: 2023-08-13 — End: 2023-08-14
  Administered 2023-08-13 – 2023-08-14 (×3): 2 [IU] via SUBCUTANEOUS
  Filled 2023-08-13 (×3): qty 1

## 2023-08-13 MED ORDER — FLUTICASONE FUROATE-VILANTEROL 200-25 MCG/ACT IN AEPB
1.0000 | INHALATION_SPRAY | Freq: Every day | RESPIRATORY_TRACT | Status: DC
  Administered 2023-08-13 – 2023-08-14 (×2): 1 via RESPIRATORY_TRACT
  Filled 2023-08-13 (×2): qty 28

## 2023-08-13 MED ORDER — LORATADINE 5 MG/5ML PO SOLN
10.0000 mg | Freq: Two times a day (BID) | ORAL | Status: DC | PRN
  Filled 2023-08-13: qty 10

## 2023-08-13 MED ORDER — BUDESONIDE 0.25 MG/2ML IN SUSP: 0.2500 mg | Freq: Two times a day (BID) | RESPIRATORY_TRACT | Status: DC

## 2023-08-13 MED ORDER — DICYCLOMINE HCL 20 MG PO TABS: 20.0000 mg | ORAL_TABLET | Freq: Three times a day (TID) | ORAL | Status: DC | PRN

## 2023-08-13 MED ORDER — LORATADINE 5 MG/5ML PO SOLN: 10.0000 mg | Freq: Every day | ORAL | Status: DC | PRN

## 2023-08-13 MED ORDER — HYDROCOD POLI-CHLORPHE POLI ER 10-8 MG/5ML PO SUER
5.0000 mL | Freq: Two times a day (BID) | ORAL | Status: DC | PRN
  Administered 2023-08-13 – 2023-08-14 (×2): 5 mL via ORAL
  Filled 2023-08-13 (×2): qty 5

## 2023-08-13 MED ORDER — BROMPHENIRAMINE-PHENYLEPHRINE 2-5 MG/10ML PO LIQD
5.0000 mL | ORAL | Status: DC | PRN
  Filled 2023-08-13: qty 5

## 2023-08-13 MED ORDER — ENOXAPARIN SODIUM 40 MG/0.4ML IJ SOSY
40.0000 mg | PREFILLED_SYRINGE | INTRAMUSCULAR | Status: DC
  Administered 2023-08-13: 40 mg via SUBCUTANEOUS
  Filled 2023-08-13: qty 0.4

## 2023-08-13 MED ORDER — BUTALBITAL-APAP-CAFFEINE 50-325-40 MG PO TABS
1.0000 | ORAL_TABLET | ORAL | Status: DC | PRN
  Administered 2023-08-13 – 2023-08-14 (×3): 1 via ORAL
  Filled 2023-08-13 (×3): qty 1

## 2023-08-13 MED ORDER — BENZONATATE 100 MG PO CAPS
200.0000 mg | ORAL_CAPSULE | Freq: Three times a day (TID) | ORAL | Status: DC | PRN
  Administered 2023-08-13 (×2): 200 mg via ORAL
  Filled 2023-08-13 (×2): qty 2

## 2023-08-13 MED ORDER — SODIUM CHLORIDE 0.9 % IV SOLN
2.0000 g | Freq: Once | INTRAVENOUS | Status: AC
  Administered 2023-08-13: 2 g via INTRAVENOUS
  Filled 2023-08-13: qty 12.5

## 2023-08-13 MED ORDER — LEVOTHYROXINE SODIUM 100 MCG PO TABS
100.0000 ug | ORAL_TABLET | Freq: Every day | ORAL | Status: DC
  Administered 2023-08-14: 100 ug via ORAL
  Filled 2023-08-13 (×2): qty 1

## 2023-08-13 MED ORDER — IPRATROPIUM-ALBUTEROL 0.5-2.5 (3) MG/3ML IN SOLN
3.0000 mL | Freq: Once | RESPIRATORY_TRACT | Status: AC
  Administered 2023-08-13: 3 mL via RESPIRATORY_TRACT
  Filled 2023-08-13: qty 3

## 2023-08-13 MED ORDER — ALBUTEROL SULFATE (2.5 MG/3ML) 0.083% IN NEBU
2.5000 mg | INHALATION_SOLUTION | Freq: Four times a day (QID) | RESPIRATORY_TRACT | Status: DC
  Administered 2023-08-13 – 2023-08-14 (×4): 2.5 mg via RESPIRATORY_TRACT
  Filled 2023-08-13 (×4): qty 3

## 2023-08-13 MED ORDER — PREDNISONE 20 MG PO TABS
40.0000 mg | ORAL_TABLET | Freq: Every day | ORAL | Status: DC
  Administered 2023-08-13 – 2023-08-14 (×2): 40 mg via ORAL
  Filled 2023-08-13 (×2): qty 2

## 2023-08-13 MED ORDER — HYDROCODONE-ACETAMINOPHEN 5-325 MG PO TABS
1.0000 | ORAL_TABLET | ORAL | Status: DC | PRN
  Administered 2023-08-13 – 2023-08-14 (×4): 1 via ORAL
  Filled 2023-08-13 (×4): qty 1

## 2023-08-13 MED ORDER — GABAPENTIN 400 MG PO CAPS
800.0000 mg | ORAL_CAPSULE | Freq: Two times a day (BID) | ORAL | Status: DC
  Administered 2023-08-13 – 2023-08-14 (×2): 800 mg via ORAL
  Filled 2023-08-13 (×3): qty 2

## 2023-08-13 MED ORDER — ROPINIROLE HCL 1 MG PO TABS
1.0000 mg | ORAL_TABLET | Freq: Two times a day (BID) | ORAL | Status: DC
  Administered 2023-08-13 – 2023-08-14 (×3): 1 mg via ORAL
  Filled 2023-08-13 (×3): qty 1

## 2023-08-13 NOTE — ED Provider Notes (Signed)
 [facility] Provider Note    Event Date/Time   First MD Initiated Contact with Patient 08/13/23 [phone]     (approximate)   History   Pneumonia   HPI  Jenna Tyler is a 58 y.o. female with history of COPD, diabetes, tracheomalacia status post stenting procedure approximately 1 month ago, followed by possible aspiration pneumonia thereafter.  Had CT scan less than 10 days ago which demonstrated bilateral lower pneumonia, she has had 2 separate antibiotic trials with little improvement in cough and shortness of breath     Physical Exam   Triage Vital Signs: ED Triage Vitals  Encounter Vitals Group     BP 08/13/23 0856 (!) 140/121     Systolic BP Percentile --      Diastolic BP Percentile --      Pulse Rate 08/13/23 0854 (!) 112     Resp 08/13/23 0854 17     Temp 08/13/23 0854 98.3 F (36.8 C)     Temp Source 08/13/23 0854 Oral     SpO2 08/13/23 0854 99 %     Weight 08/13/23 0855 86.2 kg (190 lb)     Height 08/13/23 0855 1.626 m (5\' 4" )     Head Circumference --      Peak Flow --      Pain Score 08/13/23 0855 0     Pain Loc --      Pain Education --      Exclude from Growth Chart --     Most recent vital signs: Vitals:   08/13/23 0854 08/13/23 0856  BP:  (!) 140/121  Pulse: (!) 112   Resp: 17   Temp: 98.3 F (36.8 C)   SpO2: 99%      General: Awake, no distress.  CV:  Good peripheral perfusion.  Tachycardia Resp:  Normal effort.  Scattered wheezes, bibasilar Rales Abd:  No distention.  Other:     ED Results / Procedures / Treatments   Labs (all labs ordered are listed, but only abnormal results are displayed) Labs Reviewed  BASIC METABOLIC PANEL WITH GFR - Abnormal; Notable for the following components:      Result Value   Glucose, Bld 126 (*)    Creatinine, Ser 1.04 (*)    All other components within normal limits  CBC - Abnormal; Notable for the following components:   RBC 5.55 (*)    Hemoglobin 16.2 (*)    HCT  47.2 (*)    All other components within normal limits  CULTURE, BLOOD (ROUTINE X 2)  CULTURE, BLOOD (ROUTINE X 2)  LACTIC ACID, PLASMA  LACTIC ACID, PLASMA     EKG  ED ECG REPORT I, Bryson Carbine, the attending physician, personally viewed and interpreted this ECG.  Date: 08/13/2023  Rhythm: normal sinus rhythm QRS Axis: normal Intervals: normal ST/T Wave abnormalities: normal Narrative Interpretation: no evidence of acute ischemia    RADIOLOGY Chest x-ray viewed interpret by me, no acute abnormality however reviewed CT scan from 9 days ago which demonstrated bilateral lower lobe pneumonia    PROCEDURES:  Critical Care performed:   Procedures   MEDICATIONS ORDERED IN ED: Medications  vancomycin  (VANCOCIN ) IVPB 1000 mg/200 mL premix (has no administration in time range)  ceFEPIme  (MAXIPIME ) 2 g in sodium chloride  0.9 % 100 mL IVPB (2 g Intravenous New Bag/Given 08/13/23 1049)  ipratropium-albuterol  (DUONEB) 0.5-2.5 (3) MG/3ML nebulizer solution 3 mL (3 mLs Nebulization Given 08/13/23 0935)  ipratropium-albuterol  (DUONEB) 0.5-2.5 (3) MG/3ML  nebulizer solution 3 mL (3 mLs Nebulization Given 08/13/23 0934)     IMPRESSION / MDM / ASSESSMENT AND PLAN / ED COURSE  I reviewed the triage vital signs and the nursing notes. Patient's presentation is most consistent with acute presentation with potential threat to life or bodily function.  Patient with a history of COPD presents with cough, shortness of breath.  Differential includes continuation of pneumonia, COPD exacerbation  She is tachycardic here but in no acute distress at this time.  Lab work reviewed and is generally reassuring.  X-ray without clear evidence of pneumonia but pneumonia seen only on CT scan in the past.  She has frequent coughing spells  Will treat cough with DuoNebs to see if this improves, anticipate admission will draw blood cultures and lactic acid  Broad-spectrum antibiotics initiated, have  discussed with the hospitalist for admission      FINAL CLINICAL IMPRESSION(S) / ED DIAGNOSES   Final diagnoses:  Pneumonia of both lower lobes due to infectious organism     Rx / DC Orders   ED Discharge Orders     None        Note:  This document was prepared using Dragon voice recognition software and may include unintentional dictation errors.   Bryson Carbine, MD 08/13/23 1053

## 2023-08-13 NOTE — H&P (Signed)
 History and Physical    Jenna Tyler ZOX:[id number] DOB: [birth year] DOA: 08/13/2023  PCP: Will Hare, NP (Confirm with patient/family/NH records and if not entered, this has to be entered at [facility] point of entry) Patient coming from: Home  I have personally briefly reviewed patient's old medical records in [facility]  Chief Complaint: Worsening of cough  HPI: Jenna Tyler is a 58 y.o. female with medical history significant of bronchial tracheomalacia status post recent repair, asthma/COPD, HTN, HLD, hypothyroidism, IIDM, presented with worsening cough and shortness of breath.  4 weeks ago, patient underwent robotic repairment bronchial tracheomalacia, soon after the procedure, patient started develop dry cough and shortness of breath.  She went to see her doctor and underwent to CT scan, which showed evidence of  "pneumonia "and she was treated with 2 rounds of antibiotics including azithromycin  and cephalosporin, however the cough has been persistent and she has had increasing of shortness of breath.  Last night she could not sleep overnight because of the worsening of the cough and she tried several cough medications without significant improvement to come to ED.  Denied any chest pain no fever or chills.  Denied any nausea vomiting abdominal pain.  ED Course: Afebrile, borderline tachycardia blood pressure 140/100 assertion 99% on room air.  Blood work showed sodium 140 potassium 3.5, BUN 12 cornering 1.0, WBC 8.9, hemoglobin 16.2.  X-ray showed no acute findings.  Patient was given cefepime  and vancomycin  in the ED.  Review of Systems: As per HPI otherwise 14 point review of systems negative.    Past Medical History:  Diagnosis Date   AKI (acute kidney injury) (HCC) 07/12/2017   Asthma    COPD (chronic obstructive pulmonary disease) (HCC)    COPD exacerbation (HCC) 12/29/2016   COPD with acute exacerbation (HCC) 10/02/2022   DM (diabetes mellitus), type 2  (HCC)    Dyspnea    Fibromyalgia    Guillain Barr syndrome (HCC)    Headache    Hyperlipidemia    Hypertension    Hypothyroidism    Neurofibromatosis, peripheral, NF1 (HCC) 2017   Pancreatitis    PONV (postoperative nausea and vomiting)    Pulmonary nodule    RLS (restless legs syndrome)    Sleep apnea    never went to pick up her cpap   Tachycardia    Tracheomalacia     Past Surgical History:  Procedure Laterality Date   ABDOMINAL HYSTERECTOMY     BREAST BIOPSY Right 2012   negative   BRONCHIAL WASHINGS N/A 09/28/2022   Procedure: BRONCHIAL WASHINGS;  Surgeon: Erskin Hearing, MD;  Location: [facility] ORS;  Service: Thoracic;  Laterality: N/A;   CHOLECYSTECTOMY     FLEXIBLE BRONCHOSCOPY N/A 09/28/2022   Procedure: FLEXIBLE BRONCHOSCOPY;  Surgeon: Erskin Hearing, MD;  Location: [facility] ORS;  Service: Thoracic;  Laterality: N/A;   FOOT SURGERY     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 Her smoking use included cigarettes. She started smoking about 40 years ago. She has a 15 pack-year smoking history. She has  never used smokeless tobacco. She reports that she does not currently use alcohol. She reports that she does not use drugs.  Allergies  Allergen Reactions   Nsaids Other (See Comments)    Internal bleeding  Intestinal bleeding   Paxlovid [Nirmatrelvir-Ritonavir]    Tizanidine  Other (See Comments)    Visual hallucinations   Adhesive [Tape] Rash   Aspirin Other (See Comments)    Cannot take due to ulcers.   Penicillins Rash    Has patient had a PCN reaction causing immediate rash,  facial/tongue/throat swelling, SOB or lightheadedness with hypotension: Yes Has patient had a PCN reaction causing severe rash involving mucus membranes or skin necrosis: No Has patient had a PCN reaction that required hospitalization: No Has patient had a PCN reaction occurring within the last 10 years: Yes If all of the above answers are "NO", then may proceed with Cephalosporin use.  Patient tolerated ANCEF  administration    Wound Dressing Adhesive Rash    Paper Tape is tolerated    Family History  Problem Relation Age of Onset   Breast cancer Sister 96   Breast cancer Maternal Aunt 40   Breast cancer Maternal Grandmother 60   Breast cancer Maternal Aunt 40   Breast cancer Cousin 40       maternal side     Prior to Admission medications   Medication Sig Start Date End Date Taking? Authorizing Provider  albuterol  (PROVENTIL ) (2.5 MG/3ML) 0.083% nebulizer solution Take 2.5 mg by nebulization every 4 (four) hours as needed for wheezing or shortness of breath. 01/10/23  Yes [provider]  budesonide  (PULMICORT ) 0.25 MG/2ML nebulizer solution Take 0.25 mg by nebulization daily as needed. 01/10/23 01/10/24 Yes [provider]  butalbital -acetaminophen -caffeine  (FIORICET ) 50-325-40 MG tablet Take 1 tablet by mouth every 4 (four) hours as needed for headache. 03/26/22  Yes Heather Litter, MD  doxepin (SINEQUAN) 10 MG capsule Take 10 mg by mouth at bedtime. 07/09/23 07/08/24 Yes [provider]  gabapentin  (NEURONTIN ) 800 MG tablet Take 800 mg by mouth 2 (two) times daily. 11/08/20  Yes [provider]  ibuprofen (ADVIL) 200 MG tablet Take 200 mg by mouth every 6 (six) hours as needed for headache, mild pain (pain score 1-3) or fever.   Yes [provider]  levothyroxine  (SYNTHROID ) 100 MCG tablet Take 100 mcg by mouth daily.   Yes [provider]  methocarbamol  (ROBAXIN ) 750 MG tablet Take 750 mg by mouth 2 (two) times daily. 09/22/21  Yes  [provider]  metoprolol  tartrate (LOPRESSOR ) 25 MG tablet Take 25 mg by mouth every morning.   Yes [provider]  ondansetron  (ZOFRAN -ODT) 4 MG disintegrating tablet Take 1 tablet (4 mg total) by mouth every 6 (six) hours as needed for nausea or vomiting. 12/12/22  Yes Ward, Clover Dao, DO  oxyCODONE  (OXY IR/ROXICODONE ) 5 MG immediate release tablet Take 5 mg by mouth 2 (two) times daily as needed for moderate pain (pain score 4-6) or severe pain (pain score 7-10).   Yes [provider]  pantoprazole  (PROTONIX ) 40 MG tablet Take 40 mg by mouth 2 (two) times daily. 07/27/23 08/26/23 Yes [provider]  predniSONE  (DELTASONE ) 10 MG tablet Take 10 mg by mouth as directed. 40mg  x 3 days, 30mg  x 3 days, 20mg  x 3 days, 10mg  x 3 days 08/07/23  Yes [provider]  promethazine -dextromethorphan  (PROMETHAZINE -DM) 6.25-15 MG/5ML syrup Take 5 mLs by mouth 4 (four) times daily as needed for cough. 08/12/23 08/19/23  Yes [provider]  rOPINIRole  (REQUIP ) 0.5 MG tablet Take 1 mg by mouth at bedtime.   Yes [provider]  Blood Glucose Monitoring Suppl (GLUCOCOM BLOOD GLUCOSE MONITOR) DEVI 1 each as directed 08/15/22   [provider]  Cysteamine Bitartrate (PROCYSBI) 300 MG PACK Use 1 each once daily Use as instructed. Patient not taking: Reported on 08/13/2023 08/15/22 08/15/23  [provider]  glucose blood (ACCU-CHEK GUIDE TEST) test strip 1 each (1 strip total) once daily Use as instructed. 08/15/22 08/15/23  [provider]    Physical Exam: Vitals:   08/13/23 0854 08/13/23 0855 08/13/23 0856  BP:   (!) 140/121  Pulse: (!) 112    Resp: 17    Temp: 98.3 F (36.8 C)    TempSrc: Oral    SpO2: 99%    Weight:  86.2 kg   Height:  5\' 4"  (1.626 m)     Constitutional: NAD, calm, comfortable Vitals:   08/13/23 0854 08/13/23 0855 08/13/23 0856  BP:   (!) 140/121  Pulse: (!) 112    Resp: 17    Temp: 98.3 F (36.8 C)     TempSrc: Oral    SpO2: 99%    Weight:  86.2 kg   Height:  5\' 4"  (1.626 m)    Eyes: PERRL, lids and conjunctivae normal ENMT: Mucous membranes are moist. Posterior pharynx clear of any exudate or lesions.Normal dentition.  Neck: normal, supple, no masses, no thyromegaly Respiratory: clear to auscultation bilaterally, scattered wheezing bilaterally, no crackles.  Increasing respiratory effort. No accessory muscle use.  Cardiovascular: Regular rate and rhythm, no murmurs / rubs / gallops. No extremity edema. 2+ pedal pulses. No carotid bruits.  Abdomen: no tenderness, no masses palpated. No hepatosplenomegaly. Bowel sounds positive.  Musculoskeletal: no clubbing / cyanosis. No joint deformity upper and lower extremities. Good ROM, no contractures. Normal muscle tone.  Skin: no rashes, lesions, ulcers. No induration Neurologic: CN 2-12 grossly intact. Sensation intact, DTR normal. Strength 5/5 in all 4.  Psychiatric: Normal judgment and insight. Alert and oriented x 3. Normal mood.     Labs on Admission: I have personally reviewed following labs and imaging studies  CBC: Recent Labs  Lab 08/13/23 0857  WBC 8.9  HGB 16.2*  HCT 47.2*  MCV 85.0  PLT 348   Basic Metabolic Panel: Recent Labs  Lab 08/13/23 0857  NA 140  K 3.9  CL 107  CO2 23  GLUCOSE 126*  BUN 12  CREATININE 1.04*  CALCIUM 9.5   GFR: Estimated Creatinine Clearance: 63.4 mL/min (A) (by C-G formula based on SCr of 1.04 mg/dL (H)). Liver Function Tests: No results for input(s): "AST", "ALT", "ALKPHOS", "BILITOT", "PROT", "ALBUMIN" in the last 168 hours. No results for input(s): "LIPASE", "AMYLASE" in the last 168 hours. No results for input(s): "AMMONIA" in the last 168 hours. Coagulation Profile: No results for input(s): "INR", "PROTIME" in the last 168 hours. Cardiac Enzymes: No results for input(s): "CKTOTAL", "CKMB", "CKMBINDEX", "TROPONINI" in the last 168 hours. BNP (last 3 results) No results for  input(s): "PROBNP" in the last 8760 hours. HbA1C: No results for input(s): "HGBA1C" in the last 72 hours. CBG: No results for input(s): "GLUCAP" in the last 168 hours. Lipid Profile: No results for input(s): "CHOL", "HDL", "LDLCALC", "TRIG", "CHOLHDL", "LDLDIRECT" in the last 72 hours. Thyroid  Function Tests: No results for input(s): "TSH", "T4TOTAL", "FREET4", "T3FREE", "THYROIDAB" in the last 72 hours. Anemia Panel: No results for input(s): "VITAMINB12", "FOLATE", "FERRITIN", "TIBC", "  IRON", "RETICCTPCT" in the last 72 hours. Urine analysis:    Component Value Date/Time   COLORURINE YELLOW (A) 05/22/2023 1004   APPEARANCEUR HAZY (A) 05/22/2023 1004   APPEARANCEUR Cloudy (A) 09/27/2020 1438   LABSPEC 1.029 05/22/2023 1004   LABSPEC 1.021 03/28/2014 1540   PHURINE 5.0 05/22/2023 1004   GLUCOSEU NEGATIVE 05/22/2023 1004   GLUCOSEU Negative 03/28/2014 1540   HGBUR NEGATIVE 05/22/2023 1004   BILIRUBINUR MODERATE (A) 05/22/2023 1004   BILIRUBINUR Negative 09/27/2020 1438   BILIRUBINUR Negative 03/28/2014 1540   KETONESUR NEGATIVE 05/22/2023 1004   PROTEINUR NEGATIVE 05/22/2023 1004   NITRITE NEGATIVE 05/22/2023 1004   LEUKOCYTESUR MODERATE (A) 05/22/2023 1004   LEUKOCYTESUR 1+ 03/28/2014 1540    Radiological Exams on Admission: DG Chest 2 View Result Date: 08/13/2023 CLINICAL DATA:  Dyspnea, COPD. EXAM: CHEST - 2 VIEW COMPARISON:  May 22, 2023. FINDINGS: The heart size and mediastinal contours are within normal limits. Both lungs are clear. The visualized skeletal structures are unremarkable. IMPRESSION: No active cardiopulmonary disease. Electronically Signed   By: Rosalene Colon M.D.   On: 08/13/2023 11:04    EKG: Independently reviewed.  Sinus tachycardia, no acute ST changes.  Assessment/Plan Principal Problem:   PNA (pneumonia)  (please populate well all problems here in Problem List. (For example, if patient is on BP meds at home and you resume or decide to hold  them, it is a problem that needs to be her. Same for CAD, COPD, HLD and so on)  Acute bronchitis Acute asthma/COPD exacerbation - P.o. steroid - Somewhat low suspicion for HCAP, given there is no acute infiltrates or fever.  De-escalate antibiotics to Levaquin  and check atypical pneumonia study Legionella and mycoplasma and respiratory panel - Supportive care, DuoNebs, continue ICS and LABA - Incentive spirometry  IIDM -SSI as patient will be on steroid  Hypothyroidism - Continue Synthroid   Obesity - BMI= 32 - Consider calorie control  Polycythemia - Given her history of COPD and obesity, somewhat suspect OSA and nocturnal hypoxia, will check nocturnal pulse ox.  History of tracheobronchial malacia - Status post robotic repair recently - No acute concern, patient contact surgeon about her symptoms and was reassured.  Recommend if her symptoms remain stable, likely secondary discharge home tomorrow and follow-up with her surgeon and pulmonary.   DVT prophylaxis: Lovenox  Code Status: Full Family Communication: Good friend at bedside Disposition Plan: Expect less than 2 midnight hospital stay Consults called: None Admission status: MedSurg observation   [place] MD [facility] Pager [phone]  08/13/2023, 12:25 PM

## 2023-08-13 NOTE — Plan of Care (Signed)
  Problem: Education: Goal: Knowledge of General Education information will improve Description: Including pain rating scale, medication(s)/side effects and non-pharmacologic comfort measures Outcome: Progressing   Problem: Health Behavior/Discharge Planning: Goal: Ability to manage health-related needs will improve 08/13/2023 1606 by Ines Mane, LPN Outcome: Progressing 08/13/2023 1606 by Ines Mane, LPN Outcome: Progressing   Problem: Activity: Goal: Risk for activity intolerance will decrease 08/13/2023 1606 by Ines Mane, LPN Outcome: Progressing 08/13/2023 1606 by Ines Mane, LPN Outcome: Progressing

## 2023-08-13 NOTE — ED Triage Notes (Signed)
 Patient to ED via POV for pneumonia. Dx at [id number] [facility]- states she is not getting better. Has cough and SOB.

## 2023-08-14 DIAGNOSIS — J690 Pneumonitis due to inhalation of food and vomit: Secondary | ICD-10-CM | POA: Diagnosis not present

## 2023-08-14 LAB — RESPIRATORY PANEL BY PCR

## 2023-08-14 LAB — BASIC METABOLIC PANEL WITH GFR
Anion gap: 7 (ref 5–15)
BUN: 15 mg/dL (ref 6–20)
CO2: 22 mmol/L (ref 22–32)
Calcium: 9.1 mg/dL (ref 8.9–10.3)
Chloride: 106 mmol/L (ref 98–111)
Creatinine, Ser: 0.95 mg/dL (ref 0.44–1.00)
GFR, Estimated: >60 mL/min (ref >60)
Glucose, Bld: 143 mg/dL — ABNORMAL HIGH (ref 70–99)
Potassium: 3.8 mmol/L (ref 3.5–5.1)
Sodium: 135 mmol/L (ref 135–145)

## 2023-08-14 LAB — GLUCOSE, CAPILLARY
Glucose-Capillary: 136 mg/dL — ABNORMAL HIGH (ref 70–99)
Glucose-Capillary: 142 mg/dL — ABNORMAL HIGH (ref 70–99)

## 2023-08-14 MED ORDER — PREDNISONE 20 MG PO TABS: 40.0000 mg | ORAL_TABLET | Freq: Every day | ORAL | 0 refills | Status: AC

## 2023-08-14 MED ORDER — SYMBICORT 160-4.5 MCG/ACT IN AERO: 2.0000 | INHALATION_SPRAY | Freq: Two times a day (BID) | RESPIRATORY_TRACT | Status: DC

## 2023-08-14 MED ORDER — ALBUTEROL SULFATE (2.5 MG/3ML) 0.083% IN NEBU
2.5000 mg | INHALATION_SOLUTION | Freq: Three times a day (TID) | RESPIRATORY_TRACT | Status: DC
Start: 2023-08-14 — End: 2023-08-14
  Administered 2023-08-14: 2.5 mg via RESPIRATORY_TRACT
  Filled 2023-08-14: qty 3

## 2023-08-14 MED ORDER — HYDROCOD POLI-CHLORPHE POLI ER 10-8 MG/5ML PO SUER
5.0000 mL | Freq: Two times a day (BID) | ORAL | 0 refills | Status: DC | PRN
Start: 2023-08-14 — End: 2023-09-24

## 2023-08-14 NOTE — Evaluation (Signed)
 Clinical/Bedside Swallow Evaluation Patient Details  Name: Jenna Tyler MRN: [id number] Date of Birth: Dec 19, 1965  Today's Date: 08/14/2023 Time: SLP Start Time (ACUTE ONLY): 1450 SLP Stop Time (ACUTE ONLY): 1535 SLP Time Calculation (min) (ACUTE ONLY): 45 min  Past Medical History:  Past Medical History:  Diagnosis Date   AKI (acute kidney injury) (HCC) 07/12/2017   Asthma    COPD (chronic obstructive pulmonary disease) (HCC)    COPD exacerbation (HCC) 12/29/2016   COPD with acute exacerbation (HCC) 10/02/2022   DM (diabetes mellitus), type 2 (HCC)    Dyspnea    Fibromyalgia    Guillain Barr syndrome (HCC)    Headache    Hyperlipidemia    Hypertension    Hypothyroidism    Neurofibromatosis, peripheral, NF1 (HCC) 2017   Pancreatitis    PONV (postoperative nausea and vomiting)    Pulmonary nodule    RLS (restless legs syndrome)    Sleep apnea    never went to pick up her cpap   Tachycardia    Tracheomalacia    Past Surgical History:  Past Surgical History:  Procedure Laterality Date   ABDOMINAL HYSTERECTOMY     BREAST BIOPSY Right 2012   negative   BRONCHIAL WASHINGS N/A 09/28/2022   Procedure: BRONCHIAL WASHINGS;  Surgeon: Erskin Hearing, MD;  Location: [facility]S;  Service: Thoracic;  Laterality: N/A;   CHOLECYSTECTOMY     FLEXIBLE BRONCHOSCOPY N/A 09/28/2022   Procedure: FLEXIBLE BRONCHOSCOPY;  Surgeon: Erskin Hearing, MD;  Location: [facility];  Service: Thoracic;  Laterality: N/A;   FOOT SURGERY     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 female with medical history significant for  tracheobronchomalacia status post recent tracheoplasty in 06/2023 per pt(~4 weeks ago) -- which required oral intubation for the procedure, asthma/COPD, HTN, HLD, Obesity, hypothyroidism, IIDM, recent visit to PCP service on 08/07/2023 when she was prescribed prednisone  taper and 7 days of Levaquin .  She presented with worsening cough and shortness of breath.  Per chart notes, persistent cough (which pt stated she "always takes Tussinex for at home b/c other medicines don't help"), probable acute bronchitis, probable exacerbation of asthma and COPD.    Chest x-ray did Not show any acute abnormality.  Respiratory viral panel was negative.  She requested Tussionex as needed for cough.  Pulse oximetry with ambulation was normal.  97% on room air.  Of note, recently prescribed Prednisone  taper to continue prednisone  for 3 more days at discharge, and 7 days of Levaquin  on 08/07/2023 -- Levaquin  was d/c'd per note.   MD noted: Recent tracheoplasty for tracheobronchomalacia and Esophageal phase dysmotility for which Outpatient follow-up with GI has been recommended.  MD has recommended continuing Protonix .   CXR this admit: Both lungs are clear. The visualized skeletal structures are  unremarkable.     IMPRESSION:  No active cardiopulmonary disease.   CT of Abd in 04/2023 stated same of Lungs then too ("No acute abnormality").    Assessment / Plan / Recommendation  Clinical Impression   Pt seen for BSE today. Pt is prepping for Discharge home.  Pt awake, A/O x4 and pleasant. Resting in bed. Noted an intermittent dry, hacking cough, nonproductive. Pt stated she "coughed all night last night until she got Tussinex" medication. Pt Denied any swallowing difficulty at lunch and breakfast meals today; pt stated she enjoyed the brownie at lunch.  On RA, afebrile. WBC WNL.   OF NOTE: Pt endorses s/s of REFLUX at home; self-medicates. She reports episodes of GLOBUS and discomfort when eating certain foods stating a "stuck"  feeling pointing to her upper sternum area. She endorses hacking, dry coughing, "feels uncontrolled sometimes".    Pt appears to present w/ functional oropharyngeal phase swallowing at her meals and trials of thin liquids (straw use) w/ No immediate, overt coughing noted; No neuromuscular swallowing deficits appreciated. Pt appears at reduced risk for aspiration/aspiration pneumonia from an oropharyngeal phase standpoint when following general aspiration precautions. 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 ANY Esophageal Dysmotility or Regurgitation of Reflux material can increase risk for aspiration of the Reflux material during Retrograde flow thus impact Voicing and Pulmonary status. Pt also has a recent procedure for tracheomalacia requiring oral intubation(at that time of illness, it was stated she had aspiration pneumonia). Chest Imaging before in 04/2023 and currently, 07/2023, do NOT indicate any cardiopulmonary disease. Pt also has Baseline COPD and other pulmonary issues impacting her airway health(chronic).     Pt sat more upright in bed and consumed several trials of thin liquids Via Straw w/ No immediate, overt clinical s/s of aspiration noted; clear vocal quality b/t trials, no decline in pulmonary status, no immediate cough. Oral phase appeared [facility] for bolus management and timely A-P transfer/clearing of material. OM exam was [facility] for oral clearing; lingual/labial movements. No unilateral weakness. Speech clear.    Recommend continue a Regular diet (moistened foods) w/ thin liquids. General aspiration precautions. Rest Breaks during meals/oral intake to allow for Esophageal clearing. REFLUX precautions strongly recommended to lessen chance for Regurgitation -- HOB elevated post meals and at night when sleeping. Pills in Puree if needed for ease of clearing.    Recommend pt f/u w/ GI for  assessment/management of Esophageal phase Dysmotility as she was told by MD as well as Reflux s/s; tx as indicated. Discussion on REFLUX and dry mouth w/ pt; strategies for both. Encouraged pt f/u w/ her PCP and potential outpatient objective swallow study if any negative sequelae of aspiration suspected. MD to reconsult ST services if any new needs while admitted. NSG updated. Pt appreciative of Education and information. SLP Visit Diagnosis: Dysphagia, unspecified (R13.10) (potential Esophageal phase Dysmotility)    Aspiration Risk   (reduced following general precautions)    Diet Recommendation   Thin;Age appropriate regular (w/ cut/chopped meats and moistened foods - small bites/sips) = a Regular diet (moistened foods) w/ thin liquids. General aspiration precautions. Rest Breaks during meals/oral intake to allow for Esophageal clearing. REFLUX precautions strongly recommended to lessen chance for Regurgitation -- HOB elevated post meals and at night when sleeping.   Medication Administration: Whole meds with liquid (or in puree if needed for ease of swallowing and clearing)    Other  Recommendations Recommended Consults: Consider GI evaluation;Consider esophageal assessment (Dietician) Oral Care Recommendations: Oral care BID;Patient independent with oral care    Recommendations for follow up therapy are one component of a multi-disciplinary discharge planning process, led by the attending physician.  Recommendations may be updated based on patient status,  additional functional criteria and insurance authorization.  Follow up Recommendations No SLP follow up at this time     Assistance Recommended at Discharge  PRN  Functional Status Assessment Patient has had a recent decline in their functional status and demonstrates the ability to make significant improvements in function in a reasonable and predictable amount of time.  Frequency and Duration  (n/a)   (n/a)       Prognosis Prognosis  for improved oropharyngeal function: Good Barriers to Reach Goals:  (Medical Comorbidities; Esophageal phase Dysmotility issues) Barriers/Prognosis Comment: Esophageal phase Dysmotility issues      Swallow Study   General Date of Onset: 08/13/23 HPI: Pt is a 58 y.o. female with medical history significant for tracheobronchomalacia status post recent tracheoplasty in 06/2023 per pt(~4 weeks ago) -- which required oral intubation for the procedure, asthma/COPD, HTN, HLD, Obesity, hypothyroidism, IIDM, recent visit to PCP service on 08/07/2023 when she was prescribed prednisone  taper and 7 days of Levaquin .  She presented with worsening cough and shortness of breath.  Per chart notes, persistent cough (which pt stated she "always takes Tussinex for at home b/c other medicines don't help"), probable acute bronchitis, probable exacerbation of asthma and COPD.    Chest x-ray did Not show any acute abnormality.  Respiratory viral panel was negative.  She requested Tussionex as needed for cough.  Pulse oximetry with ambulation was normal.  97% on room air.  Of note, recently prescribed Prednisone  taper to continue prednisone  for 3 more days at discharge, and 7 days of Levaquin  on 08/07/2023 -- Levaquin  was d/c'd per note.   MD noted: Recent tracheoplasty for tracheobronchomalacia and Esophageal phase dysmotility for which Outpatient follow-up with GI has been recommended.  MD has recommended continuing Protonix .   CXR this admit: Both lungs are clear. The visualized skeletal structures are  unremarkable.     IMPRESSION:  No active cardiopulmonary disease.   CT of Abd in 04/2023 stated same of Lungs then too ("No acute abnormality"). Type of Study: Bedside Swallow Evaluation Previous Swallow Assessment: none Diet Prior to this Study: Regular;Thin liquids (Level 0) Temperature Spikes Noted: No (wbc 8.9) Respiratory Status: Room air History of Recent Intubation: No Behavior/Cognition: Alert;Cooperative;Pleasant  mood Oral Cavity Assessment: Within Functional Limits Oral Care Completed by SLP: Recent completion by staff Oral Cavity - Dentition: Adequate natural dentition Vision: Functional for self-feeding Self-Feeding Abilities: Able to feed self Patient Positioning: Upright in bed (educated on more upright sitting for eating/drinking) Baseline Vocal Quality: Normal (min gravely?) Volitional Cough: Strong Volitional Swallow: Able to elicit    Oral/Motor/Sensory Function Overall Oral Motor/Sensory Function: Within functional limits   Ice Chips Ice chips: Not tested   Thin Liquid Thin Liquid: Within functional limits Presentation: Self Fed;Straw (8-9 trials total) Other Comments: no immediate cough nor decline in vocal quality    Nectar Thick Nectar Thick Liquid: Not tested   Honey Thick Honey Thick Liquid: Not tested   Puree Puree: Not tested   Solid     Solid: Not tested Other Comments: pt had recently eaten her lunch meal        Darla Edward, MS, CCC-SLP Speech Language Pathologist Rehab Services; [facility] [phone] (ascom) Dee Maday 08/14/2023,5:08 PM

## 2023-08-14 NOTE — Discharge Summary (Addendum)
 Physician Discharge Summary   Patient: Jenna Tyler MRN: [id number] DOB: 1966/01/18  Admit date:     08/13/2023  Discharge date: 08/14/23  Discharge Physician: Sheril Dines   PCP: Will Hare, NP   Recommendations at discharge:   Follow-up with PCP in 1 week  Discharge Diagnoses: Principal Problem:   Recurrent aspiration pneumonitis (HCC)  Resolved Problems:   * No resolved hospital problems. *  Hospital Course:  Jenna Tyler is a 58 y.o. female with medical history significant for tracheobronchomalacia status post recent tracheoplasty, asthma/COPD, HTN, HLD, hypothyroidism, IIDM, recent visit to PCP service on 08/07/2023 when she was prescribed prednisone  taper and 7 days of Levaquin .  She presented with worsening cough and shortness of breath.  Assessment and Plan:  Persistent cough, probable acute bronchitis versus recurrent aspiration pneumonitis, probable exacerbation of asthma and COPD: Continue prednisone  for 3 more days at discharge.  Discontinue Levaquin .  Chest x-ray did not show any acute abnormality.  Respiratory viral panel was negative. She requested Tussionex as needed for cough. Pulse oximetry with ambulation was normal.  97% on room air. Of note, recently prescribed prednisone  taper and 7 days of Levaquin  on 08/07/2023.  Recent tracheoplasty for tracheobronchomalacia   Esophageal dysmotility: Outpatient follow-up with GI as recommended.  Continue Protonix . She was seen by the speech therapist.  No acute issues reported.   Comorbidities include type 2 diabetes mellitus, hypothyroidism, obesity,, RLS, sleep apnea, chronic headaches      Pain control - [place]  Controlled Substance Reporting System database was reviewed. and patient was instructed, not to drive, operate heavy machinery, perform activities at heights, swimming or participation in water activities or provide baby-sitting services while on Pain, Sleep and Anxiety  Medications; until their outpatient Physician has advised to do so again. Also recommended to not to take more than prescribed Pain, Sleep and Anxiety Medications.  Consultants: None Procedures performed: None Disposition: Home Diet recommendation:  Discharge Diet Orders (From admission, onward)     Start     Ordered   08/14/23 0000  Diet - low sodium heart healthy        08/14/23 1615           Cardiac and Carb modified diet DISCHARGE MEDICATION: Allergies as of 08/14/2023       Reactions   Nsaids Other (See Comments)   Internal bleeding  Intestinal bleeding   Paxlovid [nirmatrelvir-ritonavir]    Tizanidine  Other (See Comments)   Visual hallucinations   Adhesive [tape] Rash   Aspirin Other (See Comments)   Cannot take due to ulcers.   Penicillins Rash   Has patient had a PCN reaction causing immediate rash, facial/tongue/throat swelling, SOB or lightheadedness with hypotension: Yes Has patient had a PCN reaction causing severe rash involving mucus membranes or skin necrosis: No Has patient had a PCN reaction that required hospitalization: No Has patient had a PCN reaction occurring within the last 10 years: Yes If all of the above answers are "NO", then may proceed with Cephalosporin use. Patient tolerated ANCEF  administration    Wound Dressing Adhesive Rash   Paper Tape is tolerated        Medication List     STOP taking these medications    Procysbi 300 MG Pack Generic drug: Cysteamine Bitartrate       TAKE these medications    Accu-Chek Guide Test test strip Generic drug: glucose blood 1 each (1 strip total) once daily Use as instructed.   albuterol  (2.5 MG/3ML)  0.083% nebulizer solution Commonly known as: PROVENTIL  Take 2.5 mg by nebulization every 4 (four) hours as needed for wheezing or shortness of breath.   budesonide  0.25 MG/2ML nebulizer solution Commonly known as: PULMICORT  Take 0.25 mg by nebulization daily as needed.    butalbital -acetaminophen -caffeine  50-325-40 MG tablet Commonly known as: FIORICET  Take 1 tablet by mouth every 4 (four) hours as needed for headache.   chlorpheniramine-HYDROcodone  10-8 MG/5ML Commonly known as: TUSSIONEX Take 5 mLs by mouth every 12 (twelve) hours as needed for cough.   doxepin 10 MG capsule Commonly known as: SINEQUAN Take 10 mg by mouth at bedtime.   gabapentin  800 MG tablet Commonly known as: NEURONTIN  Take 800 mg by mouth 2 (two) times daily.   GlucoCom Blood Glucose Monitor Devi 1 each as directed   ibuprofen 200 MG tablet Commonly known as: ADVIL Take 200 mg by mouth every 6 (six) hours as needed for headache, mild pain (pain score 1-3) or fever.   levothyroxine  100 MCG tablet Commonly known as: SYNTHROID  Take 100 mcg by mouth daily.   methocarbamol  750 MG tablet Commonly known as: ROBAXIN  Take 750 mg by mouth 2 (two) times daily.   metoprolol  tartrate 25 MG tablet Commonly known as: LOPRESSOR  Take 25 mg by mouth every morning.   ondansetron  4 MG disintegrating tablet Commonly known as: ZOFRAN -ODT Take 1 tablet (4 mg total) by mouth every 6 (six) hours as needed for nausea or vomiting.   oxyCODONE  5 MG immediate release tablet Commonly known as: Oxy IR/ROXICODONE  Take 5 mg by mouth 2 (two) times daily as needed for moderate pain (pain score 4-6) or severe pain (pain score 7-10).   pantoprazole  40 MG tablet Commonly known as: PROTONIX  Take 40 mg by mouth 2 (two) times daily.   predniSONE  20 MG tablet Commonly known as: DELTASONE  Take 2 tablets (40 mg total) by mouth daily with breakfast for 3 days. Start taking on: August 15, 2023 What changed:  medication strength how much to take when to take this additional instructions   promethazine -dextromethorphan  6.25-15 MG/5ML syrup Commonly known as: PROMETHAZINE -DM Take 5 mLs by mouth 4 (four) times daily as needed for cough.   rOPINIRole  0.5 MG tablet Commonly known as: REQUIP  Take 1  mg by mouth at bedtime.   Symbicort  160-4.5 MCG/ACT inhaler Generic drug: budesonide -formoterol  Inhale 2 puffs into the lungs 2 (two) times daily.        Discharge Exam: Filed Weights   08/13/23 0855  Weight: 86.2 kg   GEN: NAD, dry cough SKIN: Warm and dry EYES: No pallor or icterus ENT: MMM CV: RRR PULM: CTA B ABD: soft, ND, NT, +BS CNS: AAO x 3, non focal EXT: No edema or tenderness   Condition at discharge: good  The results of significant diagnostics from this hospitalization (including imaging, microbiology, ancillary and laboratory) are listed below for reference.   Imaging Studies: DG Chest 2 View Result Date: 08/13/2023 CLINICAL DATA:  Dyspnea, COPD. EXAM: CHEST - 2 VIEW COMPARISON:  May 22, 2023. FINDINGS: The heart size and mediastinal contours are within normal limits. Both lungs are clear. The visualized skeletal structures are unremarkable. IMPRESSION: No active cardiopulmonary disease. Electronically Signed   By: Rosalene Colon M.D.   On: 08/13/2023 11:04    Microbiology: Results for orders placed or performed during the hospital encounter of 08/13/23  Blood culture (routine x 2)     Status: None (Preliminary result)   Collection Time: 08/13/23  9:35 AM   Specimen: BLOOD  Result Value Ref Range Status   Specimen Description   Final    BLOOD Blood Culture results may not be optimal due to an inadequate volume of blood received in culture bottles   Special Requests   Final    BOTTLES DRAWN AEROBIC AND ANAEROBIC RIGHT ANTECUBITAL   Culture   Final    NO GROWTH < 24 HOURS Performed at [facility], [address] [place], [place] [address]    Report Status PENDING  Incomplete  Blood culture (routine x 2)     Status: None (Preliminary result)   Collection Time: 08/13/23  9:56 AM   Specimen: BLOOD  Result Value Ref Range Status   Specimen Description   Final    BLOOD Blood Culture results may not be optimal due to an inadequate volume of  blood received in culture bottles   Special Requests   Final    BOTTLES DRAWN AEROBIC AND ANAEROBIC LEFT ANTECUBITAL   Culture   Final    NO GROWTH < 24 HOURS Performed at [facility], [address] [place], [place] [address]    Report Status PENDING  Incomplete  Respiratory (~20 pathogens) panel by PCR     Status: None   Collection Time: 08/13/23  2:51 PM   Specimen: Nasopharyngeal Swab; Respiratory  Result Value Ref Range Status   Adenovirus NOT DETECTED NOT DETECTED Final   Coronavirus 229E NOT DETECTED NOT DETECTED Final    Comment: (NOTE) The Coronavirus on the Respiratory Panel, DOES NOT test for the novel  Coronavirus (2019 nCoV)    Coronavirus HKU1 NOT DETECTED NOT DETECTED Final   Coronavirus NL63 NOT DETECTED NOT DETECTED Final   Coronavirus OC43 NOT DETECTED NOT DETECTED Final   Metapneumovirus NOT DETECTED NOT DETECTED Final   Rhinovirus / Enterovirus NOT DETECTED NOT DETECTED Final   Influenza A NOT DETECTED NOT DETECTED Final   Influenza B NOT DETECTED NOT DETECTED Final   Parainfluenza Virus 1 NOT DETECTED NOT DETECTED Final   Parainfluenza Virus 2 NOT DETECTED NOT DETECTED Final   Parainfluenza Virus 3 NOT DETECTED NOT DETECTED Final   Parainfluenza Virus 4 NOT DETECTED NOT DETECTED Final   Respiratory Syncytial Virus NOT DETECTED NOT DETECTED Final   Bordetella pertussis NOT DETECTED NOT DETECTED Final   Bordetella Parapertussis NOT DETECTED NOT DETECTED Final   Chlamydophila pneumoniae NOT DETECTED NOT DETECTED Final   Mycoplasma pneumoniae NOT DETECTED NOT DETECTED Final    Comment: Performed at [facility], [address] [place], [place] [address]    Labs: CBC: Recent Labs  Lab 08/13/23 0857  WBC 8.9  HGB 16.2*  HCT 47.2*  MCV 85.0  PLT 348   Basic Metabolic Panel: Recent Labs  Lab 08/13/23 0857 08/14/23 0619  NA 140 135  K 3.9 3.8  CL 107 106  CO2 23 22  GLUCOSE 126* 143*  BUN 12 15  CREATININE 1.04* 0.95  CALCIUM  9.5 9.1   Liver Function Tests: No results for input(s): "AST", "ALT", "ALKPHOS", "BILITOT", "PROT", "ALBUMIN" in the last 168 hours. CBG: Recent Labs  Lab 08/13/23 1333 08/13/23 1816 08/13/23 2207 08/14/23 0739 08/14/23 1136  GLUCAP 100* 126* 189* 136* 142*    Discharge time spent: greater than 30 minutes.  Signed: Sheril Dines, MD [facility] 08/14/2023

## 2023-08-14 NOTE — Progress Notes (Signed)
 Pt refused wheelchair at discharge, ambulating to exit without difficulty

## 2023-08-15 LAB — LEGIONELLA PNEUMOPHILA SEROGP 1 UR AG: L. pneumophila Serogp 1 Ur Ag: NEGATIVE

## 2023-08-16 LAB — MYCOPLASMA PNEUMONIAE ANTIBODY, IGM: Mycoplasma pneumo IgM: <770 U/mL (ref 0–769)

## 2023-08-18 LAB — CULTURE, BLOOD (ROUTINE X 2)
Culture: NO GROWTH
Culture: NO GROWTH

## 2023-08-22 ENCOUNTER — Ambulatory Visit
Admission: RE | Admit: 2023-08-22 | Discharge: 2023-08-22 | Disposition: A | Discharge status: Home / Self Care | Source: Ambulatory Visit | Attending: Family Medicine | Admitting: Family Medicine

## 2023-08-22 ENCOUNTER — Other Ambulatory Visit: Payer: Self-pay | Admitting: Family Medicine

## 2023-08-22 DIAGNOSIS — R053 Chronic cough: Secondary | ICD-10-CM

## 2023-08-22 DIAGNOSIS — J690 Pneumonitis due to inhalation of food and vomit: Secondary | ICD-10-CM | POA: Insufficient documentation

## 2023-08-22 DIAGNOSIS — R0602 Shortness of breath: Secondary | ICD-10-CM | POA: Insufficient documentation

## 2023-09-24 ENCOUNTER — Ambulatory Visit: Admitting: Family Medicine

## 2023-09-24 ENCOUNTER — Telehealth: Payer: Self-pay

## 2023-09-24 VITALS — BP 122/80 | HR 96 | Resp 16 | Ht 65.0 in | Wt 184.0 lb

## 2023-09-24 DIAGNOSIS — R053 Chronic cough: Secondary | ICD-10-CM

## 2023-09-24 DIAGNOSIS — J398 Other specified diseases of upper respiratory tract: Secondary | ICD-10-CM | POA: Diagnosis not present

## 2023-09-24 DIAGNOSIS — R7303 Prediabetes: Secondary | ICD-10-CM

## 2023-09-24 DIAGNOSIS — R1310 Dysphagia, unspecified: Secondary | ICD-10-CM

## 2023-09-24 DIAGNOSIS — E039 Hypothyroidism, unspecified: Secondary | ICD-10-CM

## 2023-09-24 DIAGNOSIS — J449 Chronic obstructive pulmonary disease, unspecified: Secondary | ICD-10-CM

## 2023-09-24 DIAGNOSIS — Z8639 Personal history of other endocrine, nutritional and metabolic disease: Secondary | ICD-10-CM

## 2023-09-24 DIAGNOSIS — E119 Type 2 diabetes mellitus without complications: Secondary | ICD-10-CM

## 2023-09-24 DIAGNOSIS — G4733 Obstructive sleep apnea (adult) (pediatric): Secondary | ICD-10-CM

## 2023-09-24 DIAGNOSIS — J690 Pneumonitis due to inhalation of food and vomit: Secondary | ICD-10-CM

## 2023-09-24 DIAGNOSIS — J45909 Unspecified asthma, uncomplicated: Secondary | ICD-10-CM

## 2023-09-24 DIAGNOSIS — F1020 Alcohol dependence, uncomplicated: Secondary | ICD-10-CM

## 2023-09-24 DIAGNOSIS — F1091 Alcohol use, unspecified, in remission: Secondary | ICD-10-CM

## 2023-09-24 DIAGNOSIS — K219 Gastro-esophageal reflux disease without esophagitis: Secondary | ICD-10-CM

## 2023-09-24 DIAGNOSIS — Z0000 Encounter for general adult medical examination without abnormal findings: Secondary | ICD-10-CM

## 2023-09-24 DIAGNOSIS — G4700 Insomnia, unspecified: Secondary | ICD-10-CM

## 2023-09-24 DIAGNOSIS — K224 Dyskinesia of esophagus: Secondary | ICD-10-CM

## 2023-09-24 MED ORDER — PREDNISONE 20 MG PO TABS
ORAL_TABLET | ORAL | 0 refills | Status: DC
Start: 2023-09-24 — End: 2023-10-23

## 2023-09-24 MED ORDER — HYDROCOD POLI-CHLORPHE POLI ER 10-8 MG/5ML PO SUER: 5.0000 mL | Freq: Four times a day (QID) | ORAL | 0 refills | Status: DC | PRN

## 2023-09-24 MED ORDER — PANTOPRAZOLE SODIUM 40 MG PO TBEC
40.0000 mg | DELAYED_RELEASE_TABLET | Freq: Two times a day (BID) | ORAL | 0 refills | Status: AC
Start: 2023-09-24 — End: ?

## 2023-09-24 MED ORDER — PROMETHAZINE-DM 6.25-15 MG/5ML PO SYRP: 2.5000 mL | ORAL_SOLUTION | Freq: Four times a day (QID) | ORAL | 0 refills | Status: DC | PRN

## 2023-09-24 MED ORDER — HYDROCOD POLI-CHLORPHE POLI ER 10-8 MG/5ML PO SUER: 5.0000 mL | Freq: Two times a day (BID) | ORAL | 0 refills | Status: DC | PRN

## 2023-09-24 MED ORDER — NEBULIZER/TUBING/MOUTHPIECE KIT: PACK | 0 refills | Status: AC

## 2023-09-24 MED ORDER — LEVOTHYROXINE SODIUM 100 MCG PO TABS: 100.0000 ug | ORAL_TABLET | Freq: Every day | ORAL | 1 refills | Status: AC

## 2023-09-24 NOTE — Addendum Note (Signed)
 Addended by: Dany Harten on: 09/24/2023 04:55 PM   Modules accepted: Orders

## 2023-09-24 NOTE — Progress Notes (Unsigned)
 Name: Jenna Tyler   MRN: [id number]    DOB: [birth year]   Date:09/24/2023       Progress Note  Chief Complaint  Patient presents with   Establish Care   Cough    Non-productive. Recently dx w/esophageal dysmotility     Subjective:   Jenna Tyler is a 58 y.o. female, presents to clinic to est care She was being seen at [facility] by Dr. 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  Inhalers don't help much,steroids help a little She's tried various other cough meds OTC and Rx Brophedm, tessalon , promethazine  Tussionex works the best, she most recently tried codeine  cough syrup which also did help just not as well as tussionex - she would like to get some sleep and be able to go to church without coughing so much it disrupts services. She is pushing for closer f/up with GI and CT surgery, pulm to reeavl the coughing which is worse than prior to procedure.  Since March she's had mulitple rounds of abx, steroids, cough meds, admissions and she has been controlling GERD, compliant with inhalers, and URI/allergy sx which are minor.  She has new CPAP and coughing is so bad she can't use it  With multiple admissions and hospitalizations she did not do any swallow studies or imaging after SLP came by and saw simply "that she could swallow"  She states she does not know if she's choking on solids or liquids just has aspiration pneumonia multiple times and even respiratory failure in the past 2-3 months Phonation has not  changed She does not currently feel post nasal drip or GERD sx With past hospitalizations she just coughed a lot and had sweats, no CP no fever - she currently has those sx, she can't tell if she's worse than last week or times she was admitted  Upcoming appt with [facility], esophageal dysmotility, on protonix  40 mg BID from ED then prior PCP refilled but not taking BID consistently  Pulm f/up at [facility] in July  She reports past narcotics and alcohol abuse/use/dependence Recently able to get off pain meds from surgery and 6-7 years sober  Celebrate recover - support group christian leader AA and narcotics Discussed and reviewed controlled substance laws, risks, PDMP, considerations with past addictions - she verbalized understanding about close f/up for refills r/t controlled substance cough syrups with new worse sx, delay with getting specialists to recheck her, and in light of past dx.     DM 5.7%, not on meds, has been dx with DM previously  A1c 07/2023   Prediabetes, RLS requip , gabapentin , GI/PPI, thyroid , doxepin for insomnia   Most meds are from PCP and a few from neurology    Current Outpatient Medications:    albuterol  (PROVENTIL ) (2.5 MG/3ML) 0.083% nebulizer solution, Take 2.5 mg by nebulization every 4 (four) hours as needed for wheezing or shortness of breath., Disp: , Rfl:    benzonatate  (TESSALON ) 200 MG capsule, Take 200 mg by mouth., Disp: , Rfl:  Blood Glucose Monitoring Suppl (GLUCOCOM BLOOD GLUCOSE MONITOR) DEVI, 1 each as directed, Disp: , Rfl:    budesonide  (PULMICORT ) 0.25 MG/2ML nebulizer solution, Take 0.25 mg by nebulization daily as needed., Disp: , Rfl:    butalbital -acetaminophen -caffeine  (FIORICET ) 50-325-40 MG tablet, Take 1 tablet by mouth every 4 (four) hours as needed for headache., Disp: 14 tablet, Rfl: 0   doxepin (SINEQUAN) 10 MG capsule, Take 10 mg by mouth at bedtime., Disp: , Rfl:    gabapentin  (NEURONTIN ) 800 MG tablet, Take 800 mg by mouth  2 (two) times daily., Disp: , Rfl:    ibuprofen (ADVIL) 200 MG tablet, Take 200 mg by mouth every 6 (six) hours as needed for headache, mild pain (pain score 1-3) or fever., Disp: , Rfl:    levothyroxine  (SYNTHROID ) 100 MCG tablet, Take 100 mcg by mouth daily., Disp: , Rfl:    methocarbamol  (ROBAXIN ) 750 MG tablet, Take 750 mg by mouth 2 (two) times daily., Disp: , Rfl:    metoprolol  tartrate (LOPRESSOR ) 25 MG tablet, Take 25 mg by mouth every morning., Disp: , Rfl:    ondansetron  (ZOFRAN -ODT) 4 MG disintegrating tablet, Take 1 tablet (4 mg total) by mouth every 6 (six) hours as needed for nausea or vomiting., Disp: 20 tablet, Rfl: 0   pantoprazole  (PROTONIX ) 40 MG tablet, Take 40 mg by mouth 2 (two) times daily., Disp: , Rfl:    SYMBICORT  160-4.5 MCG/ACT inhaler, Inhale 2 puffs into the lungs 2 (two) times daily., Disp: , Rfl:    chlorpheniramine-HYDROcodone  (TUSSIONEX) 10-8 MG/5ML, Take 5 mLs by mouth every 12 (twelve) hours as needed for cough. (Patient not taking: Reported on 09/24/2023), Disp: 40 mL, Rfl: 0   oxyCODONE  (OXY IR/ROXICODONE ) 5 MG immediate release tablet, Take 5 mg by mouth 2 (two) times daily as needed for moderate pain (pain score 4-6) or severe pain (pain score 7-10). (Patient not taking: Reported on 09/24/2023), Disp: , Rfl:    rOPINIRole  (REQUIP ) 0.5 MG tablet, Take 1 mg by mouth at bedtime. 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   Type 2 diabetes mellitus without complication (HCC) 01/16/2023   Chronic pain 10/02/2022   Viral pneumonia 04/11/2022   Obesity (BMI 30-39.9) 04/11/2022   CAP (community acquired pneumonia) 04/10/2022   Acute respiratory failure with hypoxia (HCC) 07/17/2020   Chest wall pain 07/17/2020   Abdominal pain 04/07/2020   Adaptation reaction 07/14/2019    Allergic rhinitis, seasonal 07/14/2019   Breast lump 07/14/2019   Cephalalgia 07/14/2019   Chronic pain associated with significant psychosocial dysfunction 07/14/2019   Encounter for screening for lipoid disorders 07/14/2019   Feeling bilious 07/14/2019   Feeling stressed out 07/14/2019   Immunization, tetanus toxoid 07/14/2019   Infection of the upper respiratory tract 07/14/2019   Urethra, 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   Uterine spasm 07/14/2019   Breath shortness 07/14/2019   Breathlessness on exertion 07/14/2019   SOB (shortness of breath)    Severe sepsis (HCC) 07/10/2019   Easy bruising 07/02/2019   Polycythemia 07/02/2019   Tremor 05/29/2019   Cognitive decline 09/29/2018   Acute hypoxic respiratory failure (HCC) 08/28/2018   Seizures (HCC) 01/15/2018   Leg pain 10/18/2017   Lower extremity weakness 10/18/2017   Weakness 09/16/2017   Lower extremity pain, bilateral 09/10/2017   Other symptoms and signs involving the musculoskeletal system  07/26/2017   HTN (hypertension) 12/29/2016   Substance induced mood disorder (HCC) 12/29/2016   Alcohol use disorder, severe, dependence (HCC) 12/27/2016   Major depressive disorder, 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   Vomiting of fecal matter 08/15/2016   Globus sensation 08/15/2016   Multiple somatic complaints 08/15/2016   Multiple duodenal ulcers 01/24/2016   Prediabetes 07/07/2015   Neurofibromatosis, 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   Acquired hypothyroidism 04/04/2015   Pre-diabetes 04/04/2015   Vitamin D  deficiency 04/04/2015   Borderline diabetes mellitus 04/04/2015   Anxiety 10/12/2014   Anxiety, generalized 10/12/2014   H/O: hypothyroidism 10/12/2014   Insomnia, persistent 10/12/2014   Cannot sleep 10/12/2014   Depression, major, recurrent, moderate (HCC) 10/12/2014   Drug abuse, opioid type (HCC) 10/12/2014   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) 10/12/2014   Generalized anxiety disorder  10/12/2014   Moderate episode of recurrent major depressive disorder (HCC) 10/12/2014   Opioid abuse (HCC) 10/12/2014   History of hypothyroidism 10/12/2014   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   Severe protein-calorie malnutrition (HCC) 07/30/2014   Nausea & vomiting 07/07/2014   Hypothyroidism, unspecified 07/07/2014   Depression with anxiety 07/07/2014   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/2013   Lipoma of abdominal wall 09/15/2013   Pleurisy 08/17/2010   Vomiting and diarrhea 01/23/2010   Cough 11/24/2009   Postmenopausal atrophic vaginitis 11/21/2009   Lumbar back sprain 10/10/2009   Clinical depression 07/12/2009   Arthralgia of multiple joints 06/30/2009   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   Fast heart beat 05/10/2009   Persistent insomnia 02/07/2009   Mixed disorder as reaction to stress 10/09/2008   Boil of eyelid 08/01/2007   Chest pain on breathing 06/03/2007   Low back pain 03/17/2007   Epigastric pain 11/27/2006   Hypoglycemia 11/06/2005   Addiction, opium  (HCC) 04/23/2002   Headache, migraine 04/23/1998   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    Past Surgical History:  Procedure Laterality Date   ABDOMINAL HYSTERECTOMY     APPENDECTOMY  2023   BREAST BIOPSY Right 2012   negative   BRONCHIAL DILITATION  07/17/2023   BRONCHIAL WASHINGS N/A 09/28/2022   Procedure: BRONCHIAL WASHINGS;  Surgeon: Erskin Hearing, MD;  Location: [facility] ORS;  Service: Thoracic;  Laterality: N/A;   CESAREAN SECTION  2020   CHOLECYSTECTOMY     COLON SURGERY     FLEXIBLE BRONCHOSCOPY N/A 09/28/2022   Procedure: FLEXIBLE BRONCHOSCOPY;  Surgeon: Erskin Hearing, MD;  Location: [facility] OR[facility];  Service: Thoracic;  Laterality: N/A;   FOOT SURGERY     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   OTHER SURGICAL HISTORY     excision of lipoma   OTHER SURGICAL HISTORY     Abdominal surgery   SMALL INTESTINE SURGERY  2026   THYROID  SURGERY     thyroidectomy    TUBAL LIGATION     XI ROBOTIC LAPAROSCOPIC ASSISTED APPENDECTOMY N/A 04/08/2020   Procedure: XI ROBOTIC LAPAROSCOPIC ASSISTED APPENDECTOMY;  Surgeon: Conrado Delay, DO;  Location: [facility] ORS;  Service: General;  Laterality: N/A;    Family History  Problem Relation Age of Onset   Breast cancer Sister 18   Breast cancer Maternal Aunt 40   Breast cancer Maternal Grandmother 60   Breast cancer Maternal Aunt 40   Breast cancer Cousin 40       maternal side   Heart disease Father     Social History   Tobacco Use   Smoking status: Former    Current  packs/day: 0.00    Average packs/day: 1 pack/day for 15.0 years (15.0 ttl pk-yrs)    Types: Cigarettes    Start date: 12/29/1982    Quit date: 12/28/1997    Years since quitting: 25.7   Smokeless tobacco: Never  Vaping Use   Vaping status: Never Used  Substance Use Topics   Alcohol use: Not Currently   Drug use: No     Allergies  Allergen Reactions   Nsaids Other (See Comments)    Internal bleeding  Intestinal bleeding   Paxlovid [Nirmatrelvir-Ritonavir]    Tizanidine  Other (See Comments)    Visual hallucinations   Adhesive [Tape] Rash   Aspirin Other (See Comments)    Cannot take due to ulcers.   Penicillins Rash    Has patient had a PCN reaction causing immediate rash, facial/tongue/throat swelling, SOB or lightheadedness with hypotension: Yes Has patient had a PCN reaction causing severe rash involving mucus membranes or skin necrosis: No Has patient had a PCN reaction that required hospitalization: No Has patient had a PCN reaction occurring within the last 10 years: Yes If all of the above answers are "NO", then may proceed with Cephalosporin use.  Patient tolerated ANCEF  administration    Wound Dressing Adhesive Rash    Paper Tape is tolerated    Health Maintenance  Topic Date Due   FOOT EXAM  Never done   Diabetic kidney evaluation - Urine ACR  Never done   Hepatitis C Screening  Never done   Cervical Cancer Screening  (HPV/Pap Cotest)  Never done   OPHTHALMOLOGY EXAM  10/01/2023 (Originally [birth year]17/1975)   COVID-19 Vaccine (3 - Pfizer risk series) 10/09/2023 (Originally 09/22/2019)   Zoster Vaccines- Shingrix (1 of 2) 12/24/2023 (Originally 11/16/1984)   DTaP/Tdap/Td (3 - Td or Tdap) 09/23/2024 (Originally 10/17/2022)   Pneumococcal Vaccine 80-49 Years old (3 of 3 - PCV20 or PCV21) 09/23/2024 (Originally 07/14/2022)   INFLUENZA VACCINE  11/22/2023   HEMOGLOBIN A1C  02/12/2024   Diabetic kidney evaluation - eGFR measurement  08/13/2024   MAMMOGRAM  03/18/2025   Colonoscopy  02/02/2029   HIV Screening  Completed   HPV VACCINES  Aged Out   Meningococcal B Vaccine  Aged Out    Chart Review Today: I personally reviewed active problem list, medication list, allergies, family history, social history, health maintenance, notes from last encounter, lab results, imaging with the patient/caregiver today.   Review of Systems  Constitutional: Negative.   HENT: Negative.    Eyes: Negative.   Respiratory: Negative.    Cardiovascular: Negative.   Gastrointestinal: Negative.   Endocrine: Negative.   Genitourinary: Negative.   Musculoskeletal: Negative.   Skin: Negative.   Allergic/Immunologic: Negative.   Neurological: Negative.   Hematological: Negative.   Psychiatric/Behavioral: Negative.    All other systems reviewed and are negative.    Objective:   Vitals:   09/24/23 1320  BP: 122/80  Pulse: 96  Resp: 16  SpO2: 97%  Weight: 184 lb (83.5 kg)  Height: 5\' 5"  (1.651 m)    Body mass index is 30.62 kg/m.  Physical Exam Vitals and nursing note reviewed.  Constitutional:      General: She is not in acute distress.    Appearance: Normal appearance. She is well-developed. She is diaphoretic. She is not ill-appearing or toxic-appearing.  HENT:     Head: Normocephalic and atraumatic.     Right Ear: External ear normal.     Left Ear: External ear normal.     Nose:  Nose normal.  Eyes:     General: No  scleral icterus.       Right eye: No discharge.        Left eye: No discharge.     Conjunctiva/sclera: Conjunctivae normal.  Neck:     Trachea: No tracheal deviation.  Cardiovascular:     Rate and Rhythm: Normal rate and regular rhythm.     Pulses: Normal pulses.     Heart sounds: Normal heart sounds.  Pulmonary:     Effort: Pulmonary effort is normal. No respiratory distress.     Breath sounds: Normal breath sounds. No stridor. No wheezing, rhonchi or rales.     Comments: Frequent coughing Abdominal:     General: Bowel sounds are normal.     Palpations: Abdomen is soft.  Skin:    General: Skin is warm.     Findings: No rash.  Neurological:     Mental Status: She is alert.     Motor: No abnormal muscle tone.     Coordination: Coordination normal.     Gait: Gait normal.  Psychiatric:        Mood and Affect: Mood normal.        Behavior: Behavior normal.      Functional Status Survey: Is the patient deaf or have difficulty hearing?: No Does the patient have difficulty seeing, even when wearing glasses/contacts?: No Does the patient have difficulty concentrating, remembering, or making decisions?: No Does the patient have difficulty walking or climbing stairs?: No Does the patient have difficulty dressing or bathing?: No Does the patient have difficulty doing errands alone such as visiting a doctor's office or shopping?: No Results for orders placed or performed during the hospital encounter of 08/13/23  Basic metabolic panel   Collection Time: 08/13/23  8:57 AM  Result Value Ref Range   Sodium 140 135 - 145 mmol/L   Potassium 3.9 3.5 - 5.1 mmol/L   Chloride 107 98 - 111 mmol/L   CO2 23 22 - 32 mmol/L   Glucose, Bld 126 (H) 70 - 99 mg/dL   BUN 12 6 - 20 mg/dL   Creatinine, Ser 2.95 (H) 0.44 - 1.00 mg/dL   Calcium 9.5 8.9 - 62.1 mg/dL   GFR, Estimated >30 >86 mL/min   Anion gap 10 5 - 15  CBC   Collection Time: 08/13/23  8:57 AM  Result Value Ref Range   WBC 8.9 4.0  - 10.5 K/uL   RBC 5.55 (H) 3.87 - 5.11 MIL/uL   Hemoglobin 16.2 (H) 12.0 - 15.0 g/dL   HCT 57.8 (H) 46.9 - 62.9 %   MCV 85.0 80.0 - 100.0 fL   MCH 29.2 26.0 - 34.0 pg   MCHC 34.3 30.0 - 36.0 g/dL   RDW 52.8 41.3 - 24.4 %   Platelets 348 150 - 400 K/uL   nRBC 0.0 0.0 - 0.2 %  Blood culture (routine x 2)   Collection Time: 08/13/23  9:35 AM   Specimen: BLOOD  Result Value Ref Range   Specimen Description      BLOOD Blood Culture results may not be optimal due to an inadequate volume of blood received in culture bottles   Special Requests      BOTTLES DRAWN AEROBIC AND ANAEROBIC RIGHT ANTECUBITAL   Culture      NO GROWTH 5 DAYS Performed at [facility], [address], [place], [place] [address]    Report Status 08/18/2023 FINAL   Lactic acid, plasma  Collection Time: 08/13/23  9:35 AM  Result Value Ref Range   Lactic Acid, Venous 1.2 0.5 - 1.9 mmol/L  Blood culture (routine x 2)   Collection Time: 08/13/23  9:56 AM   Specimen: BLOOD  Result Value Ref Range   Specimen Description      BLOOD Blood Culture results may not be optimal due to an inadequate volume of blood received in culture bottles   Special Requests      BOTTLES DRAWN AEROBIC AND ANAEROBIC LEFT ANTECUBITAL   Culture      NO GROWTH 5 DAYS Performed at [facility], [address] [place], [place] [address]    Report Status 08/18/2023 FINAL   Lactic acid, plasma   Collection Time: 08/13/23  1:03 PM  Result Value Ref Range   Lactic Acid, Venous 2.1 (HH) 0.5 - 1.9 mmol/L  Mycoplasma pneumoniae antibody, IgM   Collection Time: 08/13/23  1:03 PM  Result Value Ref Range   Mycoplasma pneumo IgM <770 0 - 769 U/mL  Procalcitonin   Collection Time: 08/13/23  1:03 PM  Result Value Ref Range   Procalcitonin <0.10 ng/mL  Hemoglobin A1c   Collection Time: 08/13/23  1:03 PM  Result Value Ref Range   Hgb A1c MFr Bld 5.7 (H) 4.8 - 5.6 %   Mean Plasma Glucose 116.89 mg/dL  Glucose, capillary    Collection Time: 08/13/23  1:33 PM  Result Value Ref Range   Glucose-Capillary 100 (H) 70 - 99 mg/dL  Respiratory (~96 pathogens) panel by PCR   Collection Time: 08/13/23  2:51 PM   Specimen: Nasopharyngeal Swab; Respiratory  Result Value Ref Range   Adenovirus NOT DETECTED NOT DETECTED   Coronavirus 229E NOT DETECTED NOT DETECTED   Coronavirus HKU1 NOT DETECTED NOT DETECTED   Coronavirus NL63 NOT DETECTED NOT DETECTED   Coronavirus OC43 NOT DETECTED NOT DETECTED   Metapneumovirus NOT DETECTED NOT DETECTED   Rhinovirus / Enterovirus NOT DETECTED NOT DETECTED   Influenza A NOT DETECTED NOT DETECTED   Influenza B NOT DETECTED NOT DETECTED   Parainfluenza Virus 1 NOT DETECTED NOT DETECTED   Parainfluenza Virus 2 NOT DETECTED NOT DETECTED   Parainfluenza Virus 3 NOT DETECTED NOT DETECTED   Parainfluenza Virus 4 NOT DETECTED NOT DETECTED   Respiratory Syncytial Virus NOT DETECTED NOT DETECTED   Bordetella pertussis NOT DETECTED NOT DETECTED   Bordetella Parapertussis NOT DETECTED NOT DETECTED   Chlamydophila pneumoniae NOT DETECTED NOT DETECTED   Mycoplasma pneumoniae NOT DETECTED NOT DETECTED  Glucose, capillary   Collection Time: 08/13/23  6:16 PM  Result Value Ref Range   Glucose-Capillary 126 (H) 70 - 99 mg/dL  Glucose, capillary   Collection Time: 08/13/23 10:07 PM  Result Value Ref Range   Glucose-Capillary 189 (H) 70 - 99 mg/dL  Legionella Pneumophila Serogp 1 Ur Ag   Collection Time: 08/14/23  4:04 AM  Result Value Ref Range   L. pneumophila Serogp 1 Ur Ag Negative Negative   Source of Sample URINE, RANDOM   Basic metabolic panel   Collection Time: 08/14/23  6:19 AM  Result Value Ref Range   Sodium 135 135 - 145 mmol/L   Potassium 3.8 3.5 - 5.1 mmol/L   Chloride 106 98 - 111 mmol/L   CO2 22 22 - 32 mmol/L   Glucose, Bld 143 (H) 70 - 99 mg/dL   BUN 15 6 - 20 mg/dL   Creatinine, Ser 2.95 0.44 - 1.00 mg/dL   Calcium 9.1 8.9 -  10.3 mg/dL   GFR, Estimated >16 >10 mL/min    Anion gap 7 5 - 15  Glucose, capillary   Collection Time: 08/14/23  7:39 AM  Result Value Ref Range   Glucose-Capillary 136 (H) 70 - 99 mg/dL  Glucose, capillary   Collection Time: 08/14/23 11:36 AM  Result Value Ref Range   Glucose-Capillary 142 (H) 70 - 99 mg/dL      Assessment & Plan:   Problem List Items Addressed This Visit     Insomnia, persistent   On doxepin per prior PCP      Alcohol use disorder in remission   Sober for 6+ years      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   Pending pulm f/up with [facility] pulm in [place] currently has nebs albuterol  and pulmicort , daily symbicort             Cough   Chronic recurrent - more severe after procedure in march seeing CT surgery, GI and pulm Recurrent aspiration pneumonia, controlled substance cough syrups are all that works for cough, and sometimes steroids help a little Longer steroid taper  Tussionex - will need to do close f/up and consider controlled substance contract for if she needs syrups chronically      Relevant Medications   predniSONE  (DELTASONE ) 20 MG tablet  benzonatate  (TESSALON ) 200 MG capsule   promethazine -dextromethorphan  (PROMETHAZINE -DM) 6.25-15 MG/5ML syrup     Other Relevant Orders   DG UGI W DOUBLE CM (HD BA)   Hypothyroidism, unspecified   Euthyroid sx on current dose - levothyroxine  100 mcg daily [facility] chart shows low TSH, [facility] chart shows Sept 2024 TSH high  Will need to obtain labs at next appt and adjust meds if out of normal range - for now same dose Lab Results  Component Value Date   TSH 0.061 (L) 10/04/2021        Relevant Medications   levothyroxine  (SYNTHROID ) 100 MCG tablet   Type 2 diabetes mellitus without complication (HCC)   Not on meds, last A1c in prediabtes range  08/13/2023 A1c 5.7% in care everywhere      OSA (obstructive sleep apnea)   Unable to use CPAP due to coughing      Tracheobronchomalacia   Worse coughing sx after procedure in March - coughs  with speaking and really bad after eating CT surgery f/up isn't for several months - may need to consult with GI/pulm and get rechecked due to worse coughing and multiple pneumonias since procedure         Recurrent aspiration pneumonia (HCC)   Needs swallow eval? SLP She does have GI consult appt coming up and pulm consult as well       Other Relevant Orders   DG UGI W DOUBLE CM (HD BA)   COPD (chronic obstructive pulmonary disease) (HCC)   Needs a new nebulizer machine Est with pulm at [facility], but would like to est locally after her next appt - once her f/up visit is done we will do referral to [facility] [place]      Relevant Medications   benzonatate  (TESSALON ) 200 MG capsule   promethazine -dextromethorphan  (PROMETHAZINE -DM) 6.25-15 MG/5ML syrup   predniSONE  (DELTASONE ) 20 MG tablet   Other Visit Diagnoses       Encounter for medical examination to establish care    -  Primary   changing PCP from [facility] - new to est care visit - reviewed hx extensively today through care everywhere      Gastroesophageal  reflux disease, unspecified whether esophagitis present       she was started on PPI BID but not compliant, encouraged her to do BID while coughing and getting sx controlled pending GI f/up   Relevant Medications   pantoprazole  (PROTONIX ) 40 MG tablet     Esophageal dysmotility       pending GI f/up, ordered swallow study, would need to get SLP consult as well?   Relevant Medications   pantoprazole  (PROTONIX ) 40 MG tablet   Other Relevant Orders   DG UGI W DOUBLE CM (HD BA)     Dysphagia, unspecified type       encouraged ppi compliance and GI f/up   Relevant Orders   DG UGI W DOUBLE CM (HD BA)        [facility] for regular meds, [facility] cough syrups Longer steroid taper Protonix  40 BID     Return for 2 week cough and cough syrup f/up .   Adeline Hone, PA-C 09/24/23 1:36 PM

## 2023-09-24 NOTE — Telephone Encounter (Signed)
 Copied from CRM [id number]. Topic: Clinical - Medication Question >> Sep 24, 2023  2:26 PM Tiffany S wrote: Reason for CRM: chlorpheniramine-HYDROcodone  (TUSSIONEX) 10-8 MG/5ML [[id number]]  Pharmacy needs clarification on medication please follow up   [phone]

## 2023-09-24 NOTE — Telephone Encounter (Signed)
 Copied from CRM [id number]. Topic: Clinical - Prescription Issue >> Sep 24, 2023  3:07 PM DeAngela L wrote: Reason for CRM: Pt called about prescription needs to be rewritten and called to the pharm quickly and reduced the dosgae from 4 times daily to 2 times daily  chlorpheniramine-HYDROcodone  (TUSSIONEX) 10-8 MG/5ML [id number] [place] [place] [address] [place] [address] Phone: [phone] Fax: [phone]  Patient num [id number] (M)

## 2023-09-25 ENCOUNTER — Encounter: Payer: Self-pay | Admitting: Family Medicine

## 2023-09-25 NOTE — Assessment & Plan Note (Signed)
 Sober for 6 years

## 2023-09-25 NOTE — Assessment & Plan Note (Signed)
 On doxepin per prior PCP

## 2023-09-25 NOTE — Telephone Encounter (Signed)
 Sherrie from [facility] called in ,needs dosage changed from 4 times daily to 2 times daily. She states med is not to be take 4 times a day.

## 2023-09-25 NOTE — Assessment & Plan Note (Signed)
 Unable to use CPAP due to coughing

## 2023-09-25 NOTE — Assessment & Plan Note (Signed)
 Chronic recurrent - more severe after procedure in march seeing CT surgery, GI and pulm Recurrent aspiration pneumonia, controlled substance cough syrups are all that works for cough, and sometimes steroids help a little Longer steroid taper  Tussionex - will need to do close f/up and consider controlled substance contract for if she needs syrups chronically

## 2023-09-25 NOTE — Assessment & Plan Note (Signed)
 Needs swallow eval? SLP She does have GI consult appt coming up and pulm consult as well

## 2023-09-25 NOTE — Assessment & Plan Note (Signed)
 Worse coughing sx after procedure in March - coughs with speaking and really bad after eating CT surgery f/up isn't for several months - may need to consult with GI/pulm and get rechecked due to worse coughing and multiple pneumonias since procedure

## 2023-09-25 NOTE — Assessment & Plan Note (Signed)
 Needs a new nebulizer machine Est with pulm at [facility], but would like to est locally after her next appt

## 2023-09-25 NOTE — Assessment & Plan Note (Signed)
 Not on meds, last A1c in prediabtes range

## 2023-09-25 NOTE — Assessment & Plan Note (Signed)
 Euthyroid sx on current dose - levothyroxine  100 mcg daily [facility] chart shows low TSH, [facility] chart shows Sept 2024 TSH high  Will need to obtain labs at next appt and adjust meds if out of normal range - for now same dose Lab Results  Component Value Date   TSH 0.061 (L) 10/04/2021

## 2023-09-25 NOTE — Assessment & Plan Note (Deleted)
 Euthyroid sx on current dose - levothyroxine  100 mcg daily [facility] chart shows low TSH, [facility] chart shows Sept 2024 TSH high  Will need to obtain labs at next appt and adjust meds if out of normal range - for now same dose Lab Results  Component Value Date   TSH 0.061 (L) 10/04/2021

## 2023-09-25 NOTE — Telephone Encounter (Signed)
 Fax is [phone]

## 2023-09-25 NOTE — Assessment & Plan Note (Signed)
>>  ASSESSMENT AND PLAN FOR HYPOTHYROIDISM, UNSPECIFIED WRITTEN ON 09/25/2023  4:29 PM BY Blanche Gallien, PA-C  Euthyroid sx on current dose - levothyroxine  100 mcg daily [facility] chart shows low TSH, [facility] chart shows Sept 2024 TSH high  Will need to obtain labs at next appt and adjust meds if out of normal range - for now same dose Lab Results  Component Value Date   TSH 0.061 (L) 10/04/2021

## 2023-09-25 NOTE — Telephone Encounter (Signed)
 Dose corrected yesterday and sent in

## 2023-09-25 NOTE — Assessment & Plan Note (Signed)
 Pending pulm f/up with [facility] in [place] currently has nebs albuterol  and pulmicort , daily symbicort 

## 2023-09-27 ENCOUNTER — Telehealth: Payer: Self-pay

## 2023-09-27 NOTE — Telephone Encounter (Signed)
 Copied from CRM [id number]. Topic: Clinical - Prescription Issue >> Sep 27, 2023  8:23 AM Bambi Bonine D wrote: Reason for CRM: [place] with [facility] stated that a prescription for  chlorpheniramine-HYDROcodone  (TUSSIONEX) 10-8 MG/5ML was sent in for the pt. [place] stated that instructions state to take the medication 4x a day and the medication should only be used 2x a day or every 12 hours. [place] would like for that to be corrected and then sent back to pharmacy so they can provide the pt with the medication.

## 2023-10-02 ENCOUNTER — Encounter: Payer: Self-pay | Admitting: Family Medicine

## 2023-10-03 ENCOUNTER — Ambulatory Visit
Admission: RE | Admit: 2023-10-03 | Discharge: 2023-10-03 | Disposition: A | Discharge status: Home / Self Care | Source: Ambulatory Visit | Attending: Family Medicine | Admitting: Family Medicine

## 2023-10-03 DIAGNOSIS — R053 Chronic cough: Secondary | ICD-10-CM | POA: Diagnosis present

## 2023-10-03 DIAGNOSIS — J398 Other specified diseases of upper respiratory tract: Secondary | ICD-10-CM | POA: Diagnosis present

## 2023-10-03 DIAGNOSIS — R1310 Dysphagia, unspecified: Secondary | ICD-10-CM | POA: Insufficient documentation

## 2023-10-03 DIAGNOSIS — K224 Dyskinesia of esophagus: Secondary | ICD-10-CM | POA: Diagnosis present

## 2023-10-03 DIAGNOSIS — J690 Pneumonitis due to inhalation of food and vomit: Secondary | ICD-10-CM | POA: Insufficient documentation

## 2023-10-04 ENCOUNTER — Ambulatory Visit: Payer: Self-pay | Admitting: Family Medicine

## 2023-10-04 ENCOUNTER — Ambulatory Visit: Payer: Self-pay

## 2023-10-04 ENCOUNTER — Other Ambulatory Visit: Payer: Self-pay | Admitting: Family Medicine

## 2023-10-04 DIAGNOSIS — J398 Other specified diseases of upper respiratory tract: Secondary | ICD-10-CM

## 2023-10-04 DIAGNOSIS — R053 Chronic cough: Secondary | ICD-10-CM

## 2023-10-04 DIAGNOSIS — J690 Pneumonitis due to inhalation of food and vomit: Secondary | ICD-10-CM

## 2023-10-04 MED ORDER — HYDROCOD POLI-CHLORPHE POLI ER 10-8 MG/5ML PO SUER: 5.0000 mL | Freq: Two times a day (BID) | ORAL | 0 refills | Status: DC | PRN

## 2023-10-04 NOTE — Telephone Encounter (Signed)
 FYI Only or Action Required?: FYI only for provider  Patient was last seen in primary care on 09/24/23. Called Nurse Triage reporting Cough. Symptoms began chronic cough. Interventions attempted: Prescription medications: tussinex. Symptoms are: rapidly worsening.  Triage Disposition: See HCP Within 4 Hours (Or PCP Triage) Patient to ED  Patient/caregiver understands and will follow disposition?: Yes   Copied from CRM [id number]. Topic: Clinical - Red Word Triage >> Oct 04, 2023 10:35 AM Phil Braun wrote: Red Word that prompted transfer to Nurse Triage:   Last week pt went to the dr and she is still having a consistent cough and she is afraid of pnuemonia. She is coughing so much that she is chocking. Reason for Disposition  [1] MILD difficulty breathing (e.g., minimal/no SOB at rest, SOB with walking, pulse <100) AND [2] still present when not coughing  (Exception: No change from usual, chronic shortness of breath.)  Answer Assessment - Initial Assessment Questions 1. ONSET: When did the cough begin?      Chronic cough, but since yesterday 2. SEVERITY: How bad is the cough today?      severe 3. SPUTUM: Describe the color of your sputum (none, dry cough; clear, white, yellow, green)     Non productive, but clear if there is any sputum 4. HEMOPTYSIS: Are you coughing up any blood? If so ask: How much? (flecks, streaks, tablespoons, etc.)     no 5. DIFFICULTY BREATHING: Are you having difficulty breathing? If Yes, ask: How bad is it? (e.g., mild, moderate, severe)    - MILD: No SOB at rest, mild SOB with walking, speaks normally in sentences, can lie down, no retractions, pulse < 100.    - MODERATE: SOB at rest, SOB with minimal exertion and prefers to sit, cannot lie down flat, speaks in phrases, mild retractions, audible wheezing, pulse 100-120.    - SEVERE: Very SOB at rest, speaks in single words, struggling to breathe, sitting hunched forward, retractions, pulse > 120       Moderate, with activity or during coughing 6. FEVER: Do you have a fever? If Yes, ask: What is your temperature, how was it measured, and when did it start?     denies 7. CARDIAC HISTORY: Do you have any history of heart disease? (e.g., heart attack, congestive heart failure)      denies 8. LUNG HISTORY: Do you have any history of lung disease?  (e.g., pulmonary embolus, asthma, emphysema)     History of aspiration pneumonia, esophageal dismotility 10. OTHER SYMPTOMS: Do you have any other symptoms? (e.g., runny nose, wheezing, chest pain)       Vomiting after strong or lengthy bouts of cough  Patient is now out of tussinex, but cough was worsening while taking  Protocols used: Cough - Chronic-A-AH

## 2023-10-04 NOTE — Progress Notes (Signed)
 Ordered refill on cough syrup per our prior conversation about use Worsening coughing sx pt was advised to go to UC or ED for eval with her hx of recurrent aspiration pneumonia since her procedure in March.    ICD-10-CM   1. 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 Recurrent aspiration pneumonitis [facility])  J69.0 DG Chest 2 View     Adeline Hone, PA-C

## 2023-10-07 NOTE — Progress Notes (Unsigned)
 [facility] [address] [place], [place] [address]  Pulmonary Sleep Medicine   Office Visit Note  Patient Name: Jenna Tyler DOB: June 29, 1965 MRN [id number]    Chief Complaint: Obstructive Sleep Apnea visit  Brief History:  Jenna Tyler is seen today for a follow up visit for APAP@ 10-17 cmH2O The patient has a 2 year 8 month history of sleep apnea. Patient is using PAP nightly.  The patient feels rested after sleeping with PAP.  The patient reports benefiting from PAP use. Reported sleepiness is  improved and the Epworth Sleepiness Score is 9 out of 24. The patient does take naps. The patient complains of the following: none. Pt did recently have PNA which limited use some.  The compliance download shows 73% compliance with an average use time of 5 hours 47 minutes. The AHI is 1.7.  The patient does complain of limb movements disrupting sleep. The patient continues to require PAP therapy in order to eliminate sleep apnea.   ROS  General: (-) fever, (-) chills, (-) night sweat Nose and Sinuses: (-) nasal stuffiness or itchiness, (-) postnasal drip, (-) nosebleeds, (-) sinus trouble. Mouth and Throat: (-) sore throat, (-) hoarseness. Neck: (-) swollen glands, (-) enlarged thyroid , (-) neck pain. Respiratory: + cough, - shortness of breath, - wheezing. Neurologic: - numbness, - tingling. Psychiatric: - anxiety, - depression   Current Medication: Outpatient Encounter Medications as of 10/08/2023  Medication Sig   albuterol  (PROVENTIL ) (2.5 MG/3ML) 0.083% nebulizer solution Take 2.5 mg by nebulization every 4 (four) hours as needed for wheezing or shortness of breath.   Blood Glucose Monitoring Suppl (GLUCOCOM BLOOD GLUCOSE MONITOR) DEVI 1 each as directed   budesonide  (PULMICORT ) 0.25 MG/2ML nebulizer solution Take 0.25 mg by nebulization daily as needed.   butalbital -acetaminophen -caffeine  (FIORICET ) 50-325-40 MG tablet Take 1 tablet by mouth every 4 (four) hours as  needed for headache.   chlorpheniramine-HYDROcodone  (TUSSIONEX) 10-8 MG/5ML Take 5 mLs by mouth every 12 (twelve) hours as needed for cough.   doxepin (SINEQUAN) 10 MG capsule Take 10 mg by mouth at bedtime.   gabapentin  (NEURONTIN ) 800 MG tablet Take 800 mg by mouth 2 (two) times daily.   ibuprofen (ADVIL) 200 MG tablet Take 200 mg by mouth every 6 (six) hours as needed for headache, mild pain (pain score 1-3) or fever.   levothyroxine  (SYNTHROID ) 100 MCG tablet Take 1 tablet (100 mcg total) by mouth daily before breakfast.   methocarbamol  (ROBAXIN ) 750 MG tablet Take 750 mg by mouth 2 (two) times daily.   metoprolol  tartrate (LOPRESSOR ) 25 MG tablet Take 25 mg by mouth every morning.   ondansetron  (ZOFRAN -ODT) 4 MG disintegrating tablet Take 1 tablet (4 mg total) by mouth every 6 (six) hours as needed for nausea or vomiting.   pantoprazole  (PROTONIX ) 40 MG tablet Take 1 tablet (40 mg total) by mouth 2 (two) times daily.   predniSONE  (DELTASONE ) 20 MG tablet Take 40 mg (2 tabs) POQAM x 3 d, take 30 mg (1.5 tabs) POQAM x 3d, 20 mg (1 tab) poqam x 3d, 10 mg (0.5 tab) po q 3d   promethazine -dextromethorphan  (PROMETHAZINE -DM) 6.25-15 MG/5ML syrup Take 2.5-5 mLs by mouth 4 (four) times daily as needed.   Respiratory Therapy Supplies (NEBULIZER/TUBING/MOUTHPIECE) KIT Disp one nebulizer machine, tubing set and mouthpiece kit   rOPINIRole  (REQUIP ) 0.5 MG tablet Take 1 mg by mouth at bedtime. (Patient not taking: Reported on 09/24/2023)   SYMBICORT  160-4.5 MCG/ACT inhaler Inhale 2 puffs into the lungs 2 (  two) times daily.   No facility-administered encounter medications on file as of 10/08/2023.    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   OTHER SURGICAL HISTORY     excision of lipoma   OTHER SURGICAL HISTORY     Abdominal surgery   SMALL INTESTINE SURGERY  2026   THYROID  SURGERY     thyroidectomy   TUBAL LIGATION     XI ROBOTIC LAPAROSCOPIC ASSISTED APPENDECTOMY N/A 04/08/2020   Procedure: XI ROBOTIC LAPAROSCOPIC ASSISTED APPENDECTOMY;  Surgeon: Tye Millet, DO;  Location: [facility] ORS;  Service: General;  Laterality: N/A;    Medical History: Past Medical History:  Diagnosis Date   AKI (acute kidney injury) (HCC) 07/12/2017   Allergy    Asthma    COPD (chronic obstructive pulmonary disease) (HCC)    COPD exacerbation (HCC) 12/29/2016   COPD with acute exacerbation (HCC) 10/02/2022   DM (diabetes mellitus), type 2 (HCC)    Dyspnea    Esophageal dysmotility    Fibromyalgia    GERD (gastroesophageal reflux disease)    Guillain Barr syndrome (HCC)    Headache    Hyperlipidemia    Hypertension    Hypothyroidism    Neurofibromatosis, peripheral, NF1 (HCC) 2017   Pancreatitis    PONV (postoperative nausea and vomiting)    Pulmonary nodule    RLS (restless legs syndrome)    Sleep apnea    never went to pick up her cpap   Tachycardia    Tracheomalacia     Family History: Non contributory to the present illness  Social History: Social History   Socioeconomic History   Marital status: Divorced    Spouse name: Not on file   Number of children: Not on file   Years of education: Not on file   Highest education level: 12th grade  Occupational History   Not on file  Tobacco Use   Smoking status: Former    Current packs/day: 0.00    Average packs/day: 1 pack/day for 15.0 years  (15.0 ttl pk-yrs)    Types: Cigarettes    Start date: 12/29/1982    Quit date: 12/28/1997    Years since quitting: 25.7   Smokeless tobacco: Never  Vaping Use   Vaping status: Never Used  Substance and Sexual Activity   Alcohol use: Not Currently   Drug use: No   Sexual activity: Not Currently    Birth control/protection: Surgical  Other Topics Concern   Not on file  Social History Narrative   Not on file   Social Drivers of Health   Financial Resource Strain: Low Risk  (09/24/2023)   Overall Financial Resource Strain (CARDIA)    Difficulty of Paying Living Expenses: Not hard at all  Food Insecurity: No Food Insecurity (09/24/2023)   Hunger Vital Sign    Worried About Running Out of Food in the  Last Year: Never true    Ran Out of Food in the Last Year: Never true  Transportation Needs: No Transportation Needs (09/24/2023)   PRAPARE - Administrator, Civil Service (Medical): No    Lack of Transportation (Non-Medical): No  Physical Activity: Inactive (09/24/2023)   Exercise Vital Sign    Days of Exercise per Week: 0 days    Minutes of Exercise per Session: 0 min  Stress: No Stress Concern Present (09/24/2023)   [employer] of Occupational Health - Occupational Stress Questionnaire    Feeling of Stress : Not at all  Social Connections: Moderately Isolated (09/24/2023)   Social Connection and Isolation Panel    Frequency of Communication with Friends and Family: More than three times a week    Frequency of Social Gatherings with Friends and Family: Twice a week    Attends Religious Services: More than 4 times per year    Active Member of [employer] or Organizations: No    Attends Banker Meetings: Never    Marital Status: Divorced  Catering manager Violence: Not At Risk (08/13/2023)   Humiliation, Afraid, Rape, and Kick questionnaire    Fear of Current or Ex-Partner: No    Emotionally Abused: No    Physically Abused: No    Sexually Abused: No    Vital  Signs: Blood pressure (!) 143/95, pulse 66, resp. rate 16, height 5' 5 (1.651 m), weight 187 lb (84.8 kg), SpO2 98%. Body mass index is 31.12 kg/m.    Examination: General Appearance: The patient is well-developed, well-nourished, and in no distress. Neck Circumference: 43 cm Skin: Gross inspection of skin unremarkable. Head: normocephalic, no gross deformities. Eyes: no gross deformities noted. ENT: ears appear grossly normal Neurologic: Alert and oriented. No involuntary movements.  STOP BANG RISK ASSESSMENT S (snore) Have you been told that you snore?     NO   T (tired) Are you often tired, fatigued, or sleepy during the day?   NO  O (obstruction) Do you stop breathing, choke, or gasp during sleep? NO   P (pressure) Do you have or are you being treated for high blood pressure? YES   B (BMI) Is your body index greater than 35 kg/m? NO   A (age) Are you 69 years old or older? YES   N (neck) Do you have a neck circumference greater than 16 inches?   YES   G (gender) Are you a female? NO   TOTAL STOP/BANG "YES" ANSWERS 3       A STOP-Bang score of 2 or less is considered low risk, and a score of 5 or more is high risk for having either moderate or severe OSA. For people who score 3 or 4, doctors may need to perform further assessment to determine how likely they are to have OSA.         EPWORTH SLEEPINESS SCALE:  Scale:  (0)= no chance of dozing; (1)= slight chance of dozing; (2)= moderate chance of dozing; (3)= high chance of dozing  Chance  Situtation    Sitting and reading: 2    Watching TV: 2    Sitting Inactive in public: 0    As a passenger in car: 1      Lying down to rest: 2    Sitting and talking: 0    Sitting quielty after lunch: 2    In a car, stopped in traffic: 0   TOTAL SCORE:   9 out of 24  SLEEP STUDIES:  PSG - 12/27/20 - AHI 10.4/hr, REM AHI 26.3/hr, min Sp-2 84% Titration - 01/11/21 - CPAP @ 8cmH20 Titraiton - 04/25/2023 - Recommend  repeat titration wiht sleep aid Titraiton - 06/06/23 - APAP 10-17cmH20   CPAP COMPLIANCE DATA:  Date Range: 06/27/2023-04/300/2025  Average Daily Use: 5 hours 47 minutes  Median Use: 5 hours 53 minutes  Compliance for > 4 Hours: 73%  AHI: 1.7 respiratory events per hour  Days Used: 48/56 days  Mask Leak: 2.9  95th Percentile Pressure: 14.6         LABS: Recent Results (from the past 2160 hours)  Basic metabolic panel     Status: Abnormal   Collection Time: 08/13/23  8:57 AM  Result Value Ref Range   Sodium 140 135 - 145 mmol/L   Potassium 3.9 3.5 - 5.1 mmol/L   Chloride 107 98 - 111 mmol/L   CO2 23 22 - 32 mmol/L   Glucose, Bld 126 (H) 70 - 99 mg/dL    Comment: Glucose reference range applies only to samples taken after fasting for at least 8 hours.   BUN 12 6 - 20 mg/dL   Creatinine, Ser 8.95 (H) 0.44 - 1.00 mg/dL   Calcium 9.5 8.9 - 89.6 mg/dL   GFR, Estimated >39 >39 mL/min    Comment: (NOTE) Calculated using the CKD-EPI Creatinine Equation (2021)    Anion gap 10 5 - 15    Comment: Performed at [facility], [address] [place], [place] [address]  CBC     Status: Abnormal   Collection Time: 08/13/23  8:57 AM  Result Value Ref Range   WBC 8.9 4.0 - 10.5 K/uL   RBC 5.55 (H) 3.87 - 5.11 MIL/uL   Hemoglobin 16.2 (H) 12.0 - 15.0 g/dL   HCT 52.7 (H) 63.9 - 53.9 %   MCV 85.0 80.0 - 100.0 fL   MCH 29.2 26.0 - 34.0 pg   MCHC 34.3 30.0 - 36.0 g/dL   RDW 86.4 88.4 - 84.4 %   Platelets 348 150 - 400 K/uL   nRBC 0.0 0.0 - 0.2 %    Comment: Performed at [facility], [address] [place], [place] [address]  Blood culture (routine x 2)     Status: None   Collection Time: 08/13/23  9:35 AM   Specimen: BLOOD  Result Value Ref Range   Specimen Description      BLOOD Blood Culture results may not be optimal due to an inadequate volume of blood received in culture bottles   Special Requests      BOTTLES DRAWN AEROBIC AND ANAEROBIC RIGHT  ANTECUBITAL   Culture      NO GROWTH 5 DAYS Performed at [facility], [address] [place], [place] [address]    Report Status 08/18/2023 FINAL   Lactic acid, plasma     Status: None   Collection Time: 08/13/23  9:35 AM  Result Value Ref Range   Lactic Acid, Venous 1.2 0.5 - 1.9 mmol/L    Comment: Performed at [facility], [address] [place], [place] [address]  Blood culture (routine x 2)     Status: None   Collection Time: 08/13/23  9:56 AM   Specimen: BLOOD  Result Value Ref Range   Specimen Description      BLOOD Blood Culture results may not be optimal due to an inadequate volume of blood received in culture bottles   Special Requests  BOTTLES DRAWN AEROBIC AND ANAEROBIC LEFT ANTECUBITAL   Culture      NO GROWTH 5 DAYS Performed at [facility], [address] [place], [place] [address]    Report Status 08/18/2023 FINAL   Lactic acid, plasma     Status: Abnormal   Collection Time: 08/13/23  1:03 PM  Result Value Ref Range   Lactic Acid, Venous 2.1 (HH) 0.5 - 1.9 mmol/L    Comment: CRITICAL RESULT CALLED TO, READ BACK BY AND VERIFIED WITH APRIL MITCHELL 08/13/23 1337 MW Performed at [facility], [address] [place], [place] [address]   Mycoplasma pneumoniae antibody, IgM     Status: None   Collection Time: 08/13/23  1:03 PM  Result Value Ref Range   Mycoplasma pneumo IgM <770 0 - 769 U/mL    Comment: (NOTE)                             Negative            <770 Clinically significant amount of M. pneumoniae antibody not detected.                             Low Positive   770 - 950 M. pneumoniae specific IgM presumptively detected.  It is recommended that another sample be collected 1-2 weeks later to assure reactivity.                             Positive            >950 Highly significant amount of M. pneumoniae specific IgM antibody detected. Performed At: [facility] [address] [place],  [place] [id number] Jennette Shorter MD Ey:[id number]   Procalcitonin     Status: None   Collection Time: 08/13/23  1:03 PM  Result Value Ref Range   Procalcitonin <0.10 ng/mL    Comment:        Interpretation: PCT (Procalcitonin) <= 0.5 ng/mL: Systemic infection (sepsis) is not likely. Local bacterial infection is possible. (NOTE)       Sepsis PCT Algorithm           Lower Respiratory Tract                                      Infection PCT Algorithm    ----------------------------     ----------------------------         PCT < 0.25 ng/mL                PCT < 0.10 ng/mL          Strongly encourage             Strongly discourage   discontinuation of antibiotics    initiation of antibiotics    ----------------------------     -----------------------------       PCT 0.25 - 0.50 ng/mL            PCT 0.10 - 0.25 ng/mL               OR       >80% decrease in PCT            Discourage initiation of  antibiotics      Encourage discontinuation           of antibiotics    ----------------------------     -----------------------------         PCT >= 0.50 ng/mL              PCT 0.26 - 0.50 ng/mL               AND        <80% decrease in PCT             Encourage initiation of                                             antibiotics       Encourage continuation           of antibiotics    ----------------------------     -----------------------------        PCT >= 0.50 ng/mL                  PCT > 0.50 ng/mL               AND         increase in PCT                  Strongly encourage                                      initiation of antibiotics    Strongly encourage escalation           of antibiotics                                     -----------------------------                                           PCT <= 0.25 ng/mL                                                 OR                                        > 80% decrease in PCT                                       Discontinue / Do not initiate                                             antibiotics  Performed at [facility], [address] [place], [place] [address]   Hemoglobin A1c     Status: Abnormal   Collection Time:  08/13/23  1:03 PM  Result Value Ref Range   Hgb A1c MFr Bld 5.7 (H) 4.8 - 5.6 %    Comment: (NOTE) Pre diabetes:          5.7%-6.4%  Diabetes:              >6.4%  Glycemic control for   <7.0% adults with diabetes    Mean Plasma Glucose 116.89 mg/dL    Comment: Performed at [facility], [address], [place], [place] [address]  Glucose, capillary     Status: Abnormal   Collection Time: 08/13/23  1:33 PM  Result Value Ref Range   Glucose-Capillary 100 (H) 70 - 99 mg/dL    Comment: Glucose reference range applies only to samples taken after fasting for at least 8 hours.  Respiratory (~20 pathogens) panel by PCR     Status: None   Collection Time: 08/13/23  2:51 PM   Specimen: Nasopharyngeal Swab; Respiratory  Result Value Ref Range   Adenovirus NOT DETECTED NOT DETECTED   Coronavirus 229E NOT DETECTED NOT DETECTED    Comment: (NOTE) The Coronavirus on the Respiratory Panel, DOES NOT test for the novel  Coronavirus (2019 nCoV)    Coronavirus HKU1 NOT DETECTED NOT DETECTED   Coronavirus NL63 NOT DETECTED NOT DETECTED   Coronavirus OC43 NOT DETECTED NOT DETECTED   Metapneumovirus NOT DETECTED NOT DETECTED   Rhinovirus / Enterovirus NOT DETECTED NOT DETECTED   Influenza A NOT DETECTED NOT DETECTED   Influenza B NOT DETECTED NOT DETECTED   Parainfluenza Virus 1 NOT DETECTED NOT DETECTED   Parainfluenza Virus 2 NOT DETECTED NOT DETECTED   Parainfluenza Virus 3 NOT DETECTED NOT DETECTED   Parainfluenza Virus 4 NOT DETECTED NOT DETECTED   Respiratory Syncytial Virus NOT DETECTED NOT DETECTED   Bordetella pertussis NOT DETECTED NOT DETECTED   Bordetella Parapertussis NOT DETECTED NOT DETECTED   Chlamydophila pneumoniae NOT  DETECTED NOT DETECTED   Mycoplasma pneumoniae NOT DETECTED NOT DETECTED    Comment: Performed at [facility], [address], [place], [place] [address]  Glucose, capillary     Status: Abnormal   Collection Time: 08/13/23  6:16 PM  Result Value Ref Range   Glucose-Capillary 126 (H) 70 - 99 mg/dL    Comment: Glucose reference range applies only to samples taken after fasting for at least 8 hours.  Glucose, capillary     Status: Abnormal   Collection Time: 08/13/23 10:07 PM  Result Value Ref Range   Glucose-Capillary 189 (H) 70 - 99 mg/dL    Comment: Glucose reference range applies only to samples taken after fasting for at least 8 hours.  Legionella Pneumophila Serogp 1 Ur Ag     Status: None   Collection Time: 08/14/23  4:04 AM  Result Value Ref Range   L. pneumophila Serogp 1 Ur Ag Negative Negative    Comment: (NOTE) Presumptive negative for L. pneumophila serogroup 1 antigen in urine, suggesting no recent or current infection. Legionnaires' disease cannot be ruled out since other serogroups and species may also cause disease. Performed At: [facility] [address] [place], [place] [id number] Jennette Shorter MD Ey:[id number]    Source of Sample URINE, RANDOM     Comment: Performed at [facility], [address] [place] [place], [place] [address]  Basic metabolic panel     Status: Abnormal   Collection Time: 08/14/23  6:19 AM  Result Value Ref Range   Sodium 135 135 - 145  mmol/L   Potassium 3.8 3.5 - 5.1 mmol/L   Chloride 106 98 - 111 mmol/L   CO2 22 22 - 32 mmol/L   Glucose, Bld 143 (H) 70 - 99 mg/dL    Comment: Glucose reference range applies only to samples taken after fasting for at least 8 hours.   BUN 15 6 - 20 mg/dL   Creatinine, Ser 9.04 0.44 - 1.00 mg/dL   Calcium 9.1 8.9 - 89.6 mg/dL   GFR, Estimated >39 >39 mL/min    Comment: (NOTE) Calculated using the CKD-EPI Creatinine Equation (2021)    Anion gap 7 5 - 15    Comment: Performed at  [facility], [address] [place], [place] [address]  Glucose, capillary     Status: Abnormal   Collection Time: 08/14/23  7:39 AM  Result Value Ref Range   Glucose-Capillary 136 (H) 70 - 99 mg/dL    Comment: Glucose reference range applies only to samples taken after fasting for at least 8 hours.  Glucose, capillary     Status: Abnormal   Collection Time: 08/14/23 11:36 AM  Result Value Ref Range   Glucose-Capillary 142 (H) 70 - 99 mg/dL    Comment: Glucose reference range applies only to samples taken after fasting for at least 8 hours.    Radiology: DG UGI W DOUBLE CM (HD BA) Result Date: 10/03/2023 CLINICAL DATA:  58 year old female with a history of tracheobronchomalacia dilation on 07/17/2023 at [facility]. Since her surgery she has had a chronic cough that is worse with eating or drinking and multiple hospitalizations due to aspiration pneumonia. She feels that solid foods get stuck in her chest frequently when eating. EXAM: UPPER GI SERIES WITH HIGH DENSITY WITHOUT KUB TECHNIQUE: Combined double and single contrast examination was performed using effervescent crystals, high-density barium and thin liquid barium. This exam was performed by [facility] PA-C, and was supervised and interpreted by Dr. Tanda Lyons. FLUOROSCOPY: Radiation Exposure Index (as provided by the fluoroscopic device): 58.1 mGy Kerma COMPARISON:  CT CHEST WO CONTRAST - 08/22/23. FINDINGS: Esophagus: Normal appearance.  No stricture or ulceration seen. Esophageal motility: When the patient was drinking and right anterior oblique positioning, following primary and secondary waves there was persistent esophageal stasis with markedly delayed passage of contrast from the esophagus into the stomach. Contrast remained throughout the mid to superior esophagus. After approximately 5 minutes table was tilted to around 20 degrees which minimally aided the passage of contrast. Patient ultimately had to return to a full  upright position after 12 minutes for all the contrast to pass into the stomach. Gastroesophageal reflux: None visualized with coughing or Valsalva. Ingested 13mm barium tablet: Passed without difficulty into the stomach. Stomach: Normal appearance. No hiatal hernia. Gastric emptying: Normal. Duodenum: Normal appearance. Other:  Patient with frequent coughing throughout exam. IMPRESSION: Esophageal dysmotility with marked esophageal stasis. Contrast remained within the esophagus with the patient lying prone until the patient stood upright. Electronically Signed   By: Tanda Lyons M.D.   On: 10/03/2023 12:12    No results found.  DG UGI W DOUBLE CM (HD BA) Result Date: 10/03/2023 CLINICAL DATA:  58 year old female with a history of tracheobronchomalacia dilation on 07/17/2023 at [facility]. Since her surgery she has had a chronic cough that is worse with eating or drinking and multiple hospitalizations due to aspiration pneumonia. She feels that solid foods get stuck in her chest frequently when eating. EXAM: UPPER GI SERIES WITH HIGH DENSITY WITHOUT KUB TECHNIQUE: Combined double  and single contrast examination was performed using effervescent crystals, high-density barium and thin liquid barium. This exam was performed by [facility] PA-C, and was supervised and interpreted by Dr. Tanda Lyons. FLUOROSCOPY: Radiation Exposure Index (as provided by the fluoroscopic device): 58.1 mGy Kerma COMPARISON:  CT CHEST WO CONTRAST - 08/22/23. FINDINGS: Esophagus: Normal appearance.  No stricture or ulceration seen. Esophageal motility: When the patient was drinking and right anterior oblique positioning, following primary and secondary waves there was persistent esophageal stasis with markedly delayed passage of contrast from the esophagus into the stomach. Contrast remained throughout the mid to superior esophagus. After approximately 5 minutes table was tilted to around 20 degrees which minimally aided the passage of  contrast. Patient ultimately had to return to a full upright position after 12 minutes for all the contrast to pass into the stomach. Gastroesophageal reflux: None visualized with coughing or Valsalva. Ingested 13mm barium tablet: Passed without difficulty into the stomach. Stomach: Normal appearance. No hiatal hernia. Gastric emptying: Normal. Duodenum: Normal appearance. Other:  Patient with frequent coughing throughout exam. IMPRESSION: Esophageal dysmotility with marked esophageal stasis. Contrast remained within the esophagus with the patient lying prone until the patient stood upright. Electronically Signed   By: Tanda Lyons M.D.   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   Type 2 diabetes mellitus without complication (HCC) 01/16/2023   OSA (obstructive sleep apnea) 01/16/2023   Chronic pain 10/02/2022   Viral pneumonia 04/11/2022   Obesity (BMI 30-39.9) 04/11/2022   CAP (community acquired pneumonia) 04/10/2022   Fibromyalgia 03/31/2021   Acute respiratory failure with hypoxia (HCC) 07/17/2020   Chest wall pain 07/17/2020   Abdominal pain 04/07/2020   Headache disorder 12/09/2019   Adaptation reaction 07/14/2019   Allergic rhinitis, seasonal 07/14/2019   Breast lump 07/14/2019   Cephalalgia 07/14/2019   Chronic pain associated with significant psychosocial dysfunction 07/14/2019   Encounter for screening for lipoid disorders 07/14/2019   Feeling bilious 07/14/2019   Feeling stressed out 07/14/2019   Immunization, tetanus toxoid 07/14/2019   Infection of the upper respiratory tract 07/14/2019   Urethra, 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   Uterine spasm 07/14/2019   Breath shortness 07/14/2019   Breathlessness on exertion 07/14/2019   SOB (shortness of breath)     Severe sepsis (HCC) 07/10/2019   Easy bruising 07/02/2019   Polycythemia 07/02/2019   Tremor 05/29/2019   Cognitive decline 09/29/2018   Acute hypoxic respiratory failure (HCC) 08/28/2018   Seizures (HCC) 01/15/2018   Leg pain 10/18/2017   Lower extremity weakness 10/18/2017   Weakness 09/16/2017   Lower extremity pain, bilateral 09/10/2017   Other symptoms and signs involving the musculoskeletal system 07/26/2017   HTN (hypertension) 12/29/2016   Substance induced mood disorder (HCC) 12/29/2016   COPD (chronic obstructive pulmonary disease) (HCC) 12/29/2016   Alcohol use disorder in remission 12/27/2016   Major depressive disorder, 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   Vomiting of fecal matter 08/15/2016   Globus sensation 08/15/2016   Multiple somatic complaints 08/15/2016   Multiple duodenal ulcers 01/24/2016   Prediabetes 07/07/2015   Neurofibromatosis, 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   Pre-diabetes 04/04/2015   Vitamin D  deficiency 04/04/2015   Borderline diabetes mellitus 04/04/2015   Anxiety 10/12/2014   Anxiety, generalized 10/12/2014   H/O: hypothyroidism  10/12/2014   Insomnia, persistent 10/12/2014   Cannot sleep 10/12/2014   Depression, major, recurrent, moderate (HCC) 10/12/2014   Drug abuse, opioid type (HCC) 10/12/2014   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) 10/12/2014   Generalized anxiety disorder 10/12/2014   Moderate episode of recurrent major depressive disorder (HCC) 10/12/2014   Opioid abuse (HCC) 10/12/2014   History of hypothyroidism 10/12/2014   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   Severe protein-calorie malnutrition (HCC) 07/30/2014   Acquired hypothyroidism 07/07/2014   Nausea & vomiting 07/07/2014   Hypothyroidism, unspecified 07/07/2014   Depression with anxiety 07/07/2014   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/2013   Lipoma of abdominal wall 09/15/2013   Pleurisy 08/17/2010   Vomiting and diarrhea 01/23/2010    Cough 11/24/2009   Postmenopausal atrophic vaginitis 11/21/2009   Lumbar back sprain 10/10/2009   Clinical depression 07/12/2009   Arthralgia of multiple joints 06/30/2009   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 The patient was reminded how to adjust mask fit and advised to change supplies regularly. The patient was also counselled on nightly use. The compliance is fair. The AHI is 1.7. Patient continues to require PAP to treat their apnea and is medically necessary.   1. OSA (obstructive sleep apnea) (Primary) Continue nightly use  2. CPAP use counseling CPAP couseling-Discussed importance of adequate CPAP use as well as proper care and cleaning techniques of machine and all supplies.  3. Tracheobronchomalacia S/p surgery, followed by thoracic surgery  4. Mild intermittent asthma without complication Continue current medication and f/u with PCP.  5. Obesity (BMI 30.0-34.9) Obesity Counseling: Had a lengthy discussion regarding patients BMI and weight issues. Patient was instructed on portion control as well as increased activity. Also discussed caloric restrictions with trying to maintain intake less than 2000 Kcal. Discussions were made in accordance with the 5As of weight management. Simple actions such as not eating late and if able to, taking a walk is suggested.    General Counseling: I have discussed the findings of the evaluation and examination with Jenna Tyler.  I have also discussed any further diagnostic evaluation thatmay be  needed or ordered today. Jenna Tyler verbalizes understanding of the findings of todays visit. We also reviewed her medications today and discussed drug interactions and side effects including but not limited excessive drowsiness and altered mental states. We also discussed that there is always a risk not just to her but also people around her. she has been encouraged to call the office with any questions or concerns that should arise related to todays visit.  No orders of the defined types were placed in this encounter.       I have personally obtained a history, examined the patient, evaluated laboratory and imaging results, formulated the assessment and plan and placed orders.  This patient was seen by Tinnie Pro, PA-C in collaboration with Dr. Elfreda Bathe as a part of collaborative care agreement.  Elfreda DELENA Bathe, MD [facility] Diplomate ABMS Pulmonary Critical Care Medicine and Sleep Medicine

## 2023-10-08 ENCOUNTER — Ambulatory Visit (INDEPENDENT_AMBULATORY_CARE_PROVIDER_SITE_OTHER): Admitting: Internal Medicine

## 2023-10-08 VITALS — BP 143/95 | HR 66 | Resp 16 | Ht 65.0 in | Wt 187.0 lb

## 2023-10-08 DIAGNOSIS — J398 Other specified diseases of upper respiratory tract: Secondary | ICD-10-CM | POA: Diagnosis not present

## 2023-10-08 DIAGNOSIS — J4520 Mild intermittent asthma, uncomplicated: Secondary | ICD-10-CM

## 2023-10-08 DIAGNOSIS — Z7189 Other specified counseling: Secondary | ICD-10-CM

## 2023-10-08 DIAGNOSIS — G4733 Obstructive sleep apnea (adult) (pediatric): Secondary | ICD-10-CM

## 2023-10-08 DIAGNOSIS — E66811 Obesity, class 1: Secondary | ICD-10-CM

## 2023-10-08 NOTE — Patient Instructions (Signed)

## 2023-10-17 ENCOUNTER — Encounter: Payer: Self-pay | Admitting: Family Medicine

## 2023-10-17 ENCOUNTER — Other Ambulatory Visit: Payer: Self-pay

## 2023-10-18 ENCOUNTER — Ambulatory Visit: Payer: Self-pay

## 2023-10-18 DIAGNOSIS — J398 Other specified diseases of upper respiratory tract: Secondary | ICD-10-CM

## 2023-10-18 DIAGNOSIS — R053 Chronic cough: Secondary | ICD-10-CM

## 2023-10-18 NOTE — Telephone Encounter (Signed)
 FYI Only or Action Required?: FYI only for provider.  Patient was last seen in primary care on . Called Nurse Triage reporting Cough. Symptoms began awhile. Interventions attempted: Other: not sure. Symptoms are: no sure.  Triage Disposition: See PCP When Office is Open (Within 3 Days)  Patient/caregiver understands and will follow disposition?: NoCopied from CRM [id number]. Topic: Clinical - Medication Question >> Oct 18, 2023  1:28 PM Deajah BRAVO wrote: Reason for CRM: patient is requesting refill [facility]  please call patient it can be filled or not be filled Patient phone [phone] Reason for Disposition  Cough has been present for > 3 weeks  Answer Assessment - Initial Assessment Questions 1. ONSET: When did the cough begin?      'awhile'  3. SPUTUM: Describe the color of your sputum (none, dry cough; clear, white, yellow, green)     Na  4. HEMOPTYSIS: Are you coughing up any blood? If so ask: How much? (flecks, streaks, tablespoons, etc.)     na 5. DIFFICULTY BREATHING: Are you having difficulty breathing? If Yes, ask: How bad is it? (e.g., mild, moderate, severe)    - MILD: No SOB at rest, mild SOB with walking, speaks normally in sentences, can lie down, no retractions, pulse < 100.    - MODERATE: SOB at rest, SOB with minimal exertion and prefers to sit, cannot lie down flat, speaks in phrases, mild retractions, audible wheezing, pulse 100-120.    - SEVERE: Very SOB at rest, speaks in single words, struggling to breathe, sitting hunched forward, retractions, pulse > 120      na 6. FEVER: Do you have a fever? If Yes, ask: What is your temperature, how was it measured, and when did it start?     na 7. CARDIAC HISTORY: Do you have any history of heart disease? (e.g., heart attack, congestive heart failure)      na 8. LUNG HISTORY: Do you have any history of lung disease?  (e.g., pulmonary embolus, asthma, emphysema)     na 9. PE RISK FACTORS: Do you have  a history of blood clots? (or: recent major surgery, recent prolonged travel, bedridden)     na 10. OTHER SYMPTOMS: Do you have any other symptoms? (e.g., runny nose, wheezing, chest pain)       na    During triage, pt asked can my dr just call cherry tussin in? I don't need an appt.RN explained process and pt decided she no longer wanted to be triaged.  Protocols used: Cough - Acute Non-Productive-A-AH

## 2023-10-21 NOTE — Telephone Encounter (Signed)
 Appt scheduled

## 2023-10-23 ENCOUNTER — Encounter: Payer: Self-pay | Admitting: Family Medicine

## 2023-10-23 ENCOUNTER — Ambulatory Visit (INDEPENDENT_AMBULATORY_CARE_PROVIDER_SITE_OTHER): Admitting: Family Medicine

## 2023-10-23 VITALS — BP 122/84 | HR 115 | Resp 16 | Ht 65.0 in | Wt 185.0 lb

## 2023-10-23 DIAGNOSIS — E119 Type 2 diabetes mellitus without complications: Secondary | ICD-10-CM

## 2023-10-23 DIAGNOSIS — J690 Pneumonitis due to inhalation of food and vomit: Secondary | ICD-10-CM | POA: Diagnosis not present

## 2023-10-23 DIAGNOSIS — J449 Chronic obstructive pulmonary disease, unspecified: Secondary | ICD-10-CM | POA: Diagnosis not present

## 2023-10-23 DIAGNOSIS — J398 Other specified diseases of upper respiratory tract: Secondary | ICD-10-CM

## 2023-10-23 DIAGNOSIS — R053 Chronic cough: Secondary | ICD-10-CM

## 2023-10-23 DIAGNOSIS — G4733 Obstructive sleep apnea (adult) (pediatric): Secondary | ICD-10-CM

## 2023-10-23 DIAGNOSIS — R1310 Dysphagia, unspecified: Secondary | ICD-10-CM

## 2023-10-23 DIAGNOSIS — Z79899 Other long term (current) drug therapy: Secondary | ICD-10-CM | POA: Insufficient documentation

## 2023-10-23 MED ORDER — PROMETHAZINE-CODEINE 6.25-10 MG/5ML PO SYRP: 5.0000 mL | ORAL_SOLUTION | Freq: Three times a day (TID) | ORAL | 0 refills | Status: DC | PRN

## 2023-10-23 NOTE — Progress Notes (Signed)
 Patient ID: Jenna Tyler, female    DOB: Jan 23, 1966, 58 y.o.   MRN: [id number]  PCP: Leavy Mole, PA-C  Chief Complaint  Patient presents with   Medication Refill   Sleep Apnea   COPD   Cough    Sometimes productive    Subjective:   Jenna Tyler is a 58 y.o. female, presents to clinic with CC of the following:  HPI   Here for f/up and refill on cough syrups - controlled for severe persistent cough She did steroids and did not notice an improvement in her cough, she continues to cough with speaking or after eating more difficulty with solid foods then with thin liquids She completed the upper GI series, she is still pending GI follow-up scheduled next month and on a wait list she has her pulmonology follow-up in the next week Symptoms have been constant since her procedure/surgery in March No worsening or improvement of her cough. 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   Type 2 diabetes mellitus without complication (HCC) 01/16/2023   OSA (obstructive sleep apnea) 01/16/2023   Chronic pain 10/02/2022   Viral pneumonia 04/11/2022   Obesity (BMI 30-39.9) 04/11/2022   CAP (community acquired pneumonia) 04/10/2022   Fibromyalgia 03/31/2021   Acute respiratory failure with hypoxia (HCC) 07/17/2020   Chest wall pain 07/17/2020   Abdominal pain 04/07/2020   Headache disorder 12/09/2019   Adaptation reaction 07/14/2019   Allergic rhinitis, seasonal 07/14/2019   Breast lump 07/14/2019   Cephalalgia 07/14/2019   Chronic pain associated with significant psychosocial dysfunction 07/14/2019   Encounter for screening for lipoid disorders  07/14/2019   Feeling bilious 07/14/2019   Feeling stressed out 07/14/2019   Immunization, tetanus toxoid 07/14/2019   Infection of the upper respiratory tract 07/14/2019   Urethra, 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   Uterine spasm 07/14/2019   Breath shortness 07/14/2019   Breathlessness on exertion 07/14/2019   SOB (shortness of breath)    Severe sepsis (HCC) 07/10/2019   Easy bruising 07/02/2019   Polycythemia 07/02/2019   Tremor 05/29/2019   Cognitive decline 09/29/2018   Acute hypoxic respiratory failure (HCC) 08/28/2018   Seizures (HCC) 01/15/2018   Leg pain 10/18/2017   Lower extremity weakness 10/18/2017   Weakness 09/16/2017   Lower extremity pain, bilateral 09/10/2017   Other symptoms and signs involving the musculoskeletal system 07/26/2017   HTN (hypertension) 12/29/2016   Substance induced mood disorder (HCC) 12/29/2016   COPD (chronic obstructive pulmonary disease) (HCC) 12/29/2016   Alcohol use disorder in remission 12/27/2016   Major depressive disorder, 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   Vomiting of fecal matter 08/15/2016   Globus sensation 08/15/2016   Multiple somatic complaints 08/15/2016   Multiple duodenal ulcers 01/24/2016   Prediabetes 07/07/2015   Neurofibromatosis, 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   Pre-diabetes 04/04/2015   Vitamin D  deficiency 04/04/2015   Borderline diabetes mellitus 04/04/2015   Anxiety 10/12/2014   Anxiety, generalized 10/12/2014   H/O: hypothyroidism 10/12/2014   Insomnia, persistent 10/12/2014   Cannot sleep 10/12/2014   Depression, major, recurrent, moderate (HCC) 10/12/2014   Drug abuse, opioid type (HCC) 10/12/2014   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) 10/12/2014  Generalized anxiety disorder 10/12/2014   Moderate episode of recurrent major depressive disorder (HCC) 10/12/2014   Opioid abuse (HCC) 10/12/2014   History of hypothyroidism 10/12/2014   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   Severe protein-calorie malnutrition (HCC) 07/30/2014   Acquired hypothyroidism 07/07/2014   Nausea & vomiting 07/07/2014   Hypothyroidism, unspecified 07/07/2014   Depression with anxiety 07/07/2014   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/2013   Lipoma of abdominal wall 09/15/2013   Pleurisy 08/17/2010   Vomiting and diarrhea 01/23/2010   Cough 11/24/2009   Postmenopausal atrophic vaginitis 11/21/2009   Lumbar back sprain 10/10/2009   Clinical depression 07/12/2009   Arthralgia of multiple joints 06/30/2009   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   Fast heart beat 05/10/2009   Insomnia 02/07/2009   Mixed disorder as reaction to stress 10/09/2008   Boil of eyelid 08/01/2007   Chest pain on breathing 06/03/2007   Low back pain 03/17/2007   Epigastric pain 11/27/2006   Hypoglycemia 11/06/2005   Addiction, opium  (HCC) 04/23/2002   Headache, migraine 04/23/1998   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      Current Outpatient Medications:    albuterol  (PROVENTIL ) (2.5 MG/3ML) 0.083% nebulizer solution, Take 2.5 mg by nebulization every 4 (four) hours as needed for wheezing or shortness of breath., Disp: , Rfl:    Blood Glucose Monitoring Suppl (GLUCOCOM BLOOD GLUCOSE MONITOR) DEVI, 1 each as directed, Disp: , Rfl:    budesonide  (PULMICORT ) 0.25 MG/2ML nebulizer solution, Take 0.25 mg by nebulization daily as needed., Disp: , Rfl:    doxepin (SINEQUAN) 25 MG capsule, Take 25 mg by mouth at bedtime., Disp: , Rfl:    gabapentin  (NEURONTIN ) 800 MG tablet, Take 800 mg by mouth 2 (two) times daily., Disp: , Rfl:    ibuprofen (ADVIL) 200 MG tablet, Take 200 mg by mouth every 6 (six) hours as needed for headache, mild pain (pain score 1-3) or fever., Disp: , Rfl:    levothyroxine  (SYNTHROID ) 100 MCG tablet, Take 1 tablet (100 mcg total) by mouth daily before breakfast., Disp: 90 tablet, Rfl: 1   methocarbamol  (ROBAXIN ) 750 MG tablet, Take 750 mg by mouth 2 (two) times  daily., Disp: , Rfl:    metoprolol  tartrate (LOPRESSOR ) 25 MG tablet, Take 25 mg by mouth every morning., Disp: , Rfl:    ondansetron  (ZOFRAN -ODT) 4 MG disintegrating tablet, Take 1 tablet (4 mg total) by mouth every 6 (six) hours as needed for nausea or vomiting., Disp: 20 tablet, Rfl: 0   pantoprazole  (PROTONIX ) 40 MG tablet, Take 1 tablet (40 mg total) by mouth 2 (two) times daily., Disp: 180 tablet, Rfl: 0   promethazine -codeine  (PHENERGAN  WITH CODEINE ) 6.25-10 MG/5ML syrup, Take 5 mLs by mouth every 8 (eight) hours as needed for cough (chronic cough with)., Disp: 240 mL, Rfl: 0   Respiratory Therapy Supplies (NEBULIZER/TUBING/MOUTHPIECE) KIT, Disp one nebulizer machine, tubing set and mouthpiece kit, Disp: 1 kit, Rfl: 0   SYMBICORT  160-4.5 MCG/ACT inhaler, Inhale 2 puffs into the lungs 2 (two) times daily., Disp: , Rfl:    rOPINIRole  (REQUIP ) 0.5 MG tablet, Take 1 mg by mouth at bedtime. (Patient not taking: Reported on 10/23/2023), Disp: , Rfl:    Allergies  Allergen Reactions   Nsaids Other (See Comments)    Internal bleeding  Intestinal bleeding   Paxlovid [Nirmatrelvir-Ritonavir]    Tizanidine  Other (See Comments)    Visual hallucinations   Adhesive [Tape] Rash   Aspirin Other (See Comments)  Cannot take due to ulcers.   Penicillins Rash    Has patient had a PCN reaction causing immediate rash, facial/tongue/throat swelling, SOB or lightheadedness with hypotension: Yes Has patient had a PCN reaction causing severe rash involving mucus membranes or skin necrosis: No Has patient had a PCN reaction that required hospitalization: No Has patient had a PCN reaction occurring within the last 10 years: Yes If all of the above answers are NO, then may proceed with Cephalosporin use.  Patient tolerated ANCEF  administration    Wound Dressing Adhesive Rash    Paper Tape is tolerated     Social History   Tobacco Use   Smoking status: Former    Current packs/day: 0.00    Average  packs/day: 1 pack/day for 15.0 years (15.0 ttl pk-yrs)    Types: Cigarettes    Start date: 12/29/1982    Quit date: 12/28/1997    Years since quitting: 25.8   Smokeless tobacco: Never  Vaping Use   Vaping status: Never Used  Substance Use Topics   Alcohol use: Not Currently   Drug use: No      Chart Review Today: I personally reviewed active problem list, medication list, allergies, family history, social history, health maintenance, notes from last encounter, lab results, imaging with the patient/caregiver today.   Review of Systems  Constitutional: Negative.   HENT: Negative.    Eyes: Negative.   Respiratory: Negative.    Cardiovascular: Negative.   Gastrointestinal: Negative.   Endocrine: Negative.   Genitourinary: Negative.   Musculoskeletal: Negative.   Skin: Negative.   Allergic/Immunologic: Negative.   Neurological: Negative.   Hematological: Negative.   Psychiatric/Behavioral: Negative.    All other systems reviewed and are negative.      Objective:   Vitals:   10/23/23 1130  BP: 122/84  Pulse: (!) 115  Resp: 16  SpO2: 96%  Weight: 185 lb (83.9 kg)  Height: 5' 5 (1.651 m)    Body mass index is 30.79 kg/m.  Physical Exam Vitals and nursing note reviewed.  Constitutional:      General: She is not in acute distress.    Appearance: She is not ill-appearing or toxic-appearing.  HENT:     Head: Normocephalic and atraumatic.     Right Ear: External ear normal.     Left Ear: External ear normal.  Cardiovascular:     Rate and Rhythm: Normal rate and regular rhythm.  Pulmonary:     Breath sounds: No stridor. No wheezing, rhonchi or rales.     Comments: Frequent coughing, every few seconds to several times a minute No audible wheeze, no stridor, no retractions or accessory muscle use she is able to speak in short sentences and longer sentences but continues persistent repeated frequent coughing Chest:     Chest wall: No tenderness.  Skin:    Coloration:  Skin is not jaundiced or pale.  Neurological:     Mental Status: She is alert. Mental status is at baseline.  Psychiatric:        Mood and Affect: Mood normal.        Behavior: Behavior normal.      Results for orders placed or performed during the hospital encounter of 08/13/23  Basic metabolic panel   Collection Time: 08/13/23  8:57 AM  Result Value Ref Range   Sodium 140 135 - 145 mmol/L   Potassium 3.9 3.5 - 5.1 mmol/L   Chloride 107 98 - 111 mmol/L   CO2 23 22 -  32 mmol/L   Glucose, Bld 126 (H) 70 - 99 mg/dL   BUN 12 6 - 20 mg/dL   Creatinine, Ser 8.95 (H) 0.44 - 1.00 mg/dL   Calcium 9.5 8.9 - 89.6 mg/dL   GFR, Estimated >39 >39 mL/min   Anion gap 10 5 - 15  CBC   Collection Time: 08/13/23  8:57 AM  Result Value Ref Range   WBC 8.9 4.0 - 10.5 K/uL   RBC 5.55 (H) 3.87 - 5.11 MIL/uL   Hemoglobin 16.2 (H) 12.0 - 15.0 g/dL   HCT 52.7 (H) 63.9 - 53.9 %   MCV 85.0 80.0 - 100.0 fL   MCH 29.2 26.0 - 34.0 pg   MCHC 34.3 30.0 - 36.0 g/dL   RDW 86.4 88.4 - 84.4 %   Platelets 348 150 - 400 K/uL   nRBC 0.0 0.0 - 0.2 %  Blood culture (routine x 2)   Collection Time: 08/13/23  9:35 AM   Specimen: BLOOD  Result Value Ref Range   Specimen Description      BLOOD Blood Culture results may not be optimal due to an inadequate volume of blood received in culture bottles   Special Requests      BOTTLES DRAWN AEROBIC AND ANAEROBIC RIGHT ANTECUBITAL   Culture      NO GROWTH 5 DAYS Performed at [facility], [address] [place], [place] [address]    Report Status 08/18/2023 FINAL   Lactic acid, plasma   Collection Time: 08/13/23  9:35 AM  Result Value Ref Range   Lactic Acid, Venous 1.2 0.5 - 1.9 mmol/L  Blood culture (routine x 2)   Collection Time: 08/13/23  9:56 AM   Specimen: BLOOD  Result Value Ref Range   Specimen Description      BLOOD Blood Culture results may not be optimal due to an inadequate volume of blood received in culture bottles   Special Requests       BOTTLES DRAWN AEROBIC AND ANAEROBIC LEFT ANTECUBITAL   Culture      NO GROWTH 5 DAYS Performed at [facility], [address] [place], [place] [address]    Report Status 08/18/2023 FINAL   Lactic acid, plasma   Collection Time: 08/13/23  1:03 PM  Result Value Ref Range   Lactic Acid, Venous 2.1 (HH) 0.5 - 1.9 mmol/L  Mycoplasma pneumoniae antibody, IgM   Collection Time: 08/13/23  1:03 PM  Result Value Ref Range   Mycoplasma pneumo IgM <770 0 - 769 U/mL  Procalcitonin   Collection Time: 08/13/23  1:03 PM  Result Value Ref Range   Procalcitonin <0.10 ng/mL  Hemoglobin A1c   Collection Time: 08/13/23  1:03 PM  Result Value Ref Range   Hgb A1c MFr Bld 5.7 (H) 4.8 - 5.6 %   Mean Plasma Glucose 116.89 mg/dL  Glucose, capillary   Collection Time: 08/13/23  1:33 PM  Result Value Ref Range   Glucose-Capillary 100 (H) 70 - 99 mg/dL  Respiratory (~79 pathogens) panel by PCR   Collection Time: 08/13/23  2:51 PM   Specimen: Nasopharyngeal Swab; Respiratory  Result Value Ref Range   Adenovirus NOT DETECTED NOT DETECTED   Coronavirus 229E NOT DETECTED NOT DETECTED   Coronavirus HKU1 NOT DETECTED NOT DETECTED   Coronavirus NL63 NOT DETECTED NOT DETECTED   Coronavirus OC43 NOT DETECTED NOT DETECTED   Metapneumovirus NOT DETECTED NOT DETECTED   Rhinovirus / Enterovirus NOT DETECTED NOT DETECTED   Influenza A NOT DETECTED  NOT DETECTED   Influenza B NOT DETECTED NOT DETECTED   Parainfluenza Virus 1 NOT DETECTED NOT DETECTED   Parainfluenza Virus 2 NOT DETECTED NOT DETECTED   Parainfluenza Virus 3 NOT DETECTED NOT DETECTED   Parainfluenza Virus 4 NOT DETECTED NOT DETECTED   Respiratory Syncytial Virus NOT DETECTED NOT DETECTED   Bordetella pertussis NOT DETECTED NOT DETECTED   Bordetella Parapertussis NOT DETECTED NOT DETECTED   Chlamydophila pneumoniae NOT DETECTED NOT DETECTED   Mycoplasma pneumoniae NOT DETECTED NOT DETECTED  Glucose, capillary   Collection Time:  08/13/23  6:16 PM  Result Value Ref Range   Glucose-Capillary 126 (H) 70 - 99 mg/dL  Glucose, capillary   Collection Time: 08/13/23 10:07 PM  Result Value Ref Range   Glucose-Capillary 189 (H) 70 - 99 mg/dL  Legionella Pneumophila Serogp 1 Ur Ag   Collection Time: 08/14/23  4:04 AM  Result Value Ref Range   L. pneumophila Serogp 1 Ur Ag Negative Negative   Source of Sample URINE, RANDOM   Basic metabolic panel   Collection Time: 08/14/23  6:19 AM  Result Value Ref Range   Sodium 135 135 - 145 mmol/L   Potassium 3.8 3.5 - 5.1 mmol/L   Chloride 106 98 - 111 mmol/L   CO2 22 22 - 32 mmol/L   Glucose, Bld 143 (H) 70 - 99 mg/dL   BUN 15 6 - 20 mg/dL   Creatinine, Ser 9.04 0.44 - 1.00 mg/dL   Calcium 9.1 8.9 - 89.6 mg/dL   GFR, Estimated >39 >39 mL/min   Anion gap 7 5 - 15  Glucose, capillary   Collection Time: 08/14/23  7:39 AM  Result Value Ref Range   Glucose-Capillary 136 (H) 70 - 99 mg/dL  Glucose, capillary   Collection Time: 08/14/23 11:36 AM  Result Value Ref Range   Glucose-Capillary 142 (H) 70 - 99 mg/dL       Assessment & Plan:   1. OSA (obstructive sleep apnea) (Primary) Per pulmonology, currently unable to tolerate CPAP due to new chronic persistent coughing  2. Chronic obstructive pulmonary disease, unspecified COPD type (HCC) She is pending pulmonology follow-up next week  3. Recurrent aspiration pneumonitis (HCC)  - Ambulatory referral to Speech Therapy  4. Chronic cough She is seeing both GI and pulmonology She has tried steroids w/o much improvement, tried Promethazine  DM, over-the-counter cough medications, Tessalon  Perles, ever since her procedure her cough has been severe chronic persistent and very frequent and only the hydrocodone  or codeine  cough syrup helps small amount, coughing is much worse with speaking or eating, we have done an upper GI series and referred her to SLP today in addition to her current CT surgeon, pulmonologist and upcoming GI  consult - Ambulatory referral to Speech Therapy  5. Dysphagia, unspecified type She is waiting for her GI appointment and will refer to SLP in the meantime in light of her symptoms, choking on pills and solid foods She is not currently having difficulty with thin liquids however upper GI series did show liquids did not pass from her esophagus to her stomach unless she was in full an upright position she was encouraged not to eat or drink leaning back or laying down - Ambulatory referral to Speech Therapy  6. Tracheobronchomalacia She is seeing pulmonology, CT surgery and GI - Ambulatory referral to Speech Therapy   7. Controlled substance agreement signed Will Rx cough meds with codeine  or hydrocodone  for her cough for more chronic management until seen by pulm/GI  Reviewed and pt signed controlled substance agreement today. (See letter and communication in chart) - promethazine -codeine  (PHENERGAN  WITH CODEINE ) 6.25-10 MG/5ML syrup; Take 5 mLs by mouth every 8 (eight) hours as needed for cough (chronic cough with).  Dispense: 240 mL; Refill: 0   Return in about 3 months (around 01/23/2024) for Routine follow-up labs.    Michelene Cower, PA-C 10/23/23 12:03 PM

## 2023-10-23 NOTE — Patient Instructions (Signed)
  You'll need a physician's referral for speech therapy at [facility]. For more information, talk to your doctor or call the location closest to you:  [facility]) [phone]

## 2023-10-24 ENCOUNTER — Telehealth: Payer: Self-pay

## 2023-10-24 ENCOUNTER — Other Ambulatory Visit: Payer: Self-pay

## 2023-10-24 DIAGNOSIS — R053 Chronic cough: Secondary | ICD-10-CM

## 2023-10-24 DIAGNOSIS — J398 Other specified diseases of upper respiratory tract: Secondary | ICD-10-CM

## 2023-10-24 MED ORDER — HYDROCOD POLI-CHLORPHE POLI ER 10-8 MG/5ML PO SUER: 5.0000 mL | Freq: Two times a day (BID) | ORAL | 0 refills | Status: DC | PRN

## 2023-10-24 NOTE — Telephone Encounter (Signed)
 Copied from CRM [id number]. Topic: Clinical - Prescription Issue >> Oct 24, 2023  8:26 AM Elle L wrote: Reason for CRM: Marcey with [facility] called to advise that neither his Pharmacy nor surrounding Pharmacies have the promethazine -codeine  (PHENERGAN  WITH CODEINE ) 6.25-10 MG/5ML syrup in stock so he is requesting a replacement medication. He does have promethazine  with DM in stock.

## 2023-10-29 ENCOUNTER — Ambulatory Visit: Attending: Family Medicine | Admitting: Speech Pathology

## 2023-10-29 DIAGNOSIS — J398 Other specified diseases of upper respiratory tract: Secondary | ICD-10-CM | POA: Insufficient documentation

## 2023-10-29 DIAGNOSIS — R1310 Dysphagia, unspecified: Secondary | ICD-10-CM | POA: Insufficient documentation

## 2023-10-29 DIAGNOSIS — R053 Chronic cough: Secondary | ICD-10-CM | POA: Insufficient documentation

## 2023-10-29 DIAGNOSIS — J690 Pneumonitis due to inhalation of food and vomit: Secondary | ICD-10-CM | POA: Insufficient documentation

## 2023-10-29 NOTE — Therapy (Signed)
 OUTPATIENT SPEECH LANGUAGE PATHOLOGY  SWALLOW EVALUATION ONLY   Patient Name: Jenna Tyler MRN: [id number] DOB:05/02/1965, 58 y.o., female Today's Date: 10/29/2023  PCP: Michelene Cower, PA-C REFERRING PROVIDER: Michelene Cower, PA-C   End of Session - 10/29/23 0954     Visit Number 1    Number of Visits 1    Authorization Type [place] Medicaid    SLP Start Time 0930    SLP Stop Time  0955    SLP Time Calculation (min) 25 min    Activity Tolerance Patient tolerated treatment well          Past Medical History:  Diagnosis Date   AKI (acute kidney injury) (HCC) 07/12/2017   Allergy    Asthma    COPD (chronic obstructive pulmonary disease) (HCC)    COPD exacerbation (HCC) 12/29/2016   COPD with acute exacerbation (HCC) 10/02/2022   DM (diabetes mellitus), type 2 (HCC)    Dyspnea    Esophageal dysmotility    Fibromyalgia    GERD (gastroesophageal reflux disease)    Guillain Barr syndrome (HCC)    Headache    Hyperlipidemia    Hypertension    Hypothyroidism    Neurofibromatosis, peripheral, NF1 (HCC) 2017   Pancreatitis    PONV (postoperative nausea and vomiting)    Pulmonary nodule    RLS (restless legs syndrome)    Sleep apnea    never went to pick up her cpap   Tachycardia    Tracheomalacia    Past Surgical History:  Procedure Laterality Date   ABDOMINAL HYSTERECTOMY     APPENDECTOMY  2023   BREAST BIOPSY Right 2012   negative   BRONCHIAL DILITATION  07/17/2023   BRONCHIAL WASHINGS N/A 09/28/2022   Procedure: BRONCHIAL WASHINGS;  Surgeon: Parris Manna, MD;  Location: [facility];  Service: Thoracic;  Laterality: N/A;   CESAREAN SECTION  2020   CHOLECYSTECTOMY     COLON SURGERY     FLEXIBLE BRONCHOSCOPY N/A 09/28/2022   Procedure: FLEXIBLE BRONCHOSCOPY;  Surgeon: Parris Manna, MD;  Location: [facility];  Service: Thoracic;  Laterality: N/A;   FOOT SURGERY     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   OTHER SURGICAL HISTORY     excision of lipoma   OTHER  SURGICAL HISTORY     Abdominal surgery   SMALL INTESTINE SURGERY  2026   THYROID  SURGERY     thyroidectomy   TUBAL LIGATION     XI ROBOTIC LAPAROSCOPIC ASSISTED APPENDECTOMY N/A 04/08/2020   Procedure: XI ROBOTIC LAPAROSCOPIC ASSISTED APPENDECTOMY;  Surgeon: Tye Millet, DO;  Location: [facility];  Service: General;  Laterality: N/A;   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   Type 2 diabetes mellitus without complication (HCC) 01/16/2023   OSA (obstructive sleep apnea) 01/16/2023   Chronic pain 10/02/2022   Viral pneumonia 04/11/2022   Obesity (BMI 30-39.9) 04/11/2022   CAP (community acquired pneumonia) 04/10/2022   Fibromyalgia 03/31/2021   Acute respiratory failure with hypoxia (HCC) 07/17/2020   Chest wall pain 07/17/2020   Abdominal pain 04/07/2020   Headache disorder 12/09/2019   Adaptation reaction 07/14/2019   Allergic rhinitis, seasonal 07/14/2019   Breast lump 07/14/2019   Cephalalgia 07/14/2019   Chronic pain associated with significant psychosocial dysfunction 07/14/2019   Encounter for screening for lipoid disorders 07/14/2019  Feeling bilious 07/14/2019   Feeling stressed out 07/14/2019   Immunization, tetanus toxoid 07/14/2019   Infection of the upper respiratory tract 07/14/2019   Urethra, 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   Uterine spasm 07/14/2019   Breath shortness 07/14/2019   Breathlessness on exertion 07/14/2019   SOB (shortness of breath)    Severe sepsis (HCC) 07/10/2019   Easy bruising 07/02/2019   Polycythemia 07/02/2019   Tremor 05/29/2019   Cognitive decline 09/29/2018   Acute hypoxic respiratory failure (HCC) 08/28/2018   Seizures (HCC) 01/15/2018   Leg pain 10/18/2017   Lower extremity weakness 10/18/2017   Weakness  09/16/2017   Lower extremity pain, bilateral 09/10/2017   Other symptoms and signs involving the musculoskeletal system 07/26/2017   HTN (hypertension) 12/29/2016   Substance induced mood disorder (HCC) 12/29/2016   COPD (chronic obstructive pulmonary disease) (HCC) 12/29/2016   Alcohol use disorder in remission 12/27/2016   Major depressive disorder, 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   Vomiting of fecal matter 08/15/2016   Globus sensation 08/15/2016   Multiple somatic complaints 08/15/2016   Multiple duodenal ulcers 01/24/2016   Prediabetes 07/07/2015   Neurofibromatosis, 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   Pre-diabetes 04/04/2015   Vitamin D  deficiency 04/04/2015   Borderline diabetes mellitus 04/04/2015   Anxiety 10/12/2014   Anxiety, generalized 10/12/2014   H/O: hypothyroidism 10/12/2014   Insomnia, persistent 10/12/2014   Cannot sleep 10/12/2014   Depression, major, recurrent, moderate (HCC) 10/12/2014   Drug abuse, opioid type (HCC) 10/12/2014   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) 10/12/2014   Generalized anxiety disorder 10/12/2014   Moderate episode of recurrent major depressive disorder (HCC) 10/12/2014   Opioid abuse (HCC) 10/12/2014   History of hypothyroidism 10/12/2014   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   Severe protein-calorie malnutrition (HCC) 07/30/2014   Acquired hypothyroidism 07/07/2014   Nausea & vomiting 07/07/2014   Hypothyroidism, unspecified 07/07/2014   Depression with anxiety 07/07/2014   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/2013   Lipoma of abdominal wall 09/15/2013   Pleurisy 08/17/2010   Vomiting and diarrhea 01/23/2010   Cough 11/24/2009   Postmenopausal atrophic vaginitis 11/21/2009   Lumbar back sprain 10/10/2009   Clinical depression 07/12/2009   Arthralgia of multiple joints 06/30/2009   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   Fast heart beat 05/10/2009   Insomnia 02/07/2009   Mixed disorder  as reaction to stress 10/09/2008   Boil of eyelid 08/01/2007   Chest pain on breathing 06/03/2007   Low back pain 03/17/2007   Epigastric pain 11/27/2006   Hypoglycemia 11/06/2005   Addiction, opium  (HCC) 04/23/2002   Headache, migraine 04/23/1998   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    ONSET DATE: 10/27/2015 (initial mention of dysphagia in pt's chart); date of referral Initial Clinical Swallow Evaluation in chart was 01/16/2018   REFERRING DIAG: J69.0 (ICD-10-CM) - Recurrent aspiration pneumonitis (HCC) R05.3 (ICD-10-CM) - Chronic cough R13.10 (ICD-10-CM) - Dysphagia, unspecified type J39.8 (ICD-10-CM) - Tracheobronchomalacia   THERAPY DIAG:  Dysphagia, unspecified type  Rationale for Evaluation and Treatment Rehabilitation  SUBJECTIVE:   SUBJECTIVE STATEMENT: Pt pleasant, fair historian Pt accompanied by: self  PERTINENT HISTORY and DIAGNOSTIC FINDINGS:   Pt is a 58 year old female with past medical history of asthma, COPD, Type 2 DM, fibromyalgia, recurrent aspiration pneumonia with tracheobronchoplasty on 07/17/2023 at [facility] pt discharged home on 07/19/2023.  Per chart review, pt has long standing report of globus sensation dating back to 07/06/20217 with multiple Oropharyngeal Swallow Evaluations by  Speech Therapy - all revealing adequate oropharyngeal abilities (see below)  Noted on EGD 01/2016--[facility]--multiple biopsies performed. Rest of EGD was normal; no esophageal disease--despite dysphagia and globus sensation.  01/16/2018 Clinical Swallow Evaluation at [facility]-  pt reports I have to strain to get it down - results: adequate oropharyngeal abilities - regular diet with thin liquids  07/10/2019 Inpatient at [facility] for pneumonia - MBSS recommended and scheduled for later in the day: per chart pt cancelled  10/07/19 MBSS - Inpatient at [facility] for pneumonia Clinical Impression Pt presents with functional oropharyngeal  abilities when consuming puree, solids, whole barium tablet, thin liquids (via cup and straw) and nectar thick liquids. Pt's oral phase is swift with good bolus management and timely anterior-posterior propulsion. Pt's pharyngeal phase is swift with excellent airway protection. Penetration and aspiration were not observed. Of note, there appeared to be stasis of barium in esophagus. While backflow into the pharynx was not visualized, pt reports history of GERD, currently not taking any reflux medicines d/t hasn't taken time to fill the prescription and her recurrent pneumonia, I would recommend a barium swallow to fully assess function of esophagus.     08/14/2023 Clinical Swallow Evaluation - Inpatient at [facility] Pt endorses s/s of REFLUX at home; self-medicates. She reports episodes of GLOBUS and discomfort when eating certain foods stating a stuck feeling pointing to her upper sternum area. She endorses hacking, dry coughing, feels uncontrolled sometimes. Pt appears to present w/ functional oropharyngeal phase swallowing at her meals and trials of thin liquids (straw use) w/ No immediate, overt coughing noted  10/03/2023 - DG UGI Esophageal motility: When the patient was drinking and right anterior oblique positioning, following primary and secondary waves there was persistent esophageal stasis with markedly delayed passage of contrast from the esophagus into the stomach. Contrast remained throughout the mid to superior esophagus. After approximately 5 minutes table was tilted to around 20 degrees which minimally aided the passage of contrast. Patient ultimately had to return to a full upright position after 12 minutes for all the contrast to pass into the stomach.   PAIN:  Are you having pain? No  FALLS: Has patient fallen in last 6 months?  No  LIVING ENVIRONMENT: Lives with: lives alone Lives in: Mobile home  PLOF:  Level of assistance: Independent with ADLs, Independent  with IADLs Employment: On disability   PATIENT GOALS  to assess for potential aspiration with food/liquids  OBJECTIVE:   COGNITION: Overall cognitive status: No family/caregiver present to determine baseline cognitive functioning Functional deficits: Pt benefited from skilled education on dysphagia, potential causes of aspiration (oropharyngeal vs esophageal)   ORAL MOTOR EXAMINATION Facial : WFL Lingual: WFL Velum: WFL Mandible: WFL Cough: WFL Voice: WFL   CLINICAL SWALLOW ASSESSMENT:   Current diet: regular and thin liquids Feeding: able to feed self Consistencies tested: Thin Liquid: Presentation: Cup and Self-fed Oral Phase: WFL Pharyngeal Phase: WFL Regular: Presentation: By hand Oral Phase: WFL Pharyngeal Phase: WFL Other: mixed consistency (fruit cocktail) Presentation: Self-fed Oral Phase: WFL Pharyngeal Phase: WFL   Evaluation findings: Patient presents with oropharyngeal swallow which appears clinically to be within functional limits with adequate airway protection. Oral stage is characterized by appearance of adequate oral containment, mastication, bolus formation, oral transfer and oral clearance. Swallow initiation appears timely. No overt signs of aspiration.   Aspiration risk factors:History of GERD and History of esophageal-related issues Overall aspiration risk:Mild Diet Recommendations: regular and thin liquids Precautions:Minimize environmental distractions, Slow rate, Small sips/bites, Seated upright  90 degrees, and Remain upright for at least 30 minutes after meals Supervision: Patient able to feed self Oral care recommendations:Oral care BID Follow-up recommendations: No treatment recommended, Recommend GI referral, and Consider esophageal assessment     PATIENT REPORTED OUTCOME MEASURES (PROM): Reflux Symptom Index These are statements many people have used to describe their voices and the effects of their voices on their lives. In the last  month,  how did the following problems affect you?   Hoarseness or a problem with your voice 0=No problem  Clearing your throat 0=No problem  Excess throat mucous or postnasal drip 0=No problem  Difficulty swallowing food, liquids, or pills 3+Moderate problem  Coughing after you eat or after lying down 5=Problem as bad as it can be Breathing difficulties or choking episodes 5=Problem as bad as it can be Troublesome or annoying cough 5=Problem as bad as it can be Sensation of something sticking in your throat or a lump in your throat 5=Problem as bad as it can be Heartburn, chest pain, indigestion, or stomach acid coming up 5=Problem as bad as it can be  Total: 28    Normative data suggests that an RSI of greater than or equal to 13 is clinically significant  Therefore, an RSI > 13 may be indicative of significant reflux disease.  TODAY'S TREATMENT:  Education provided on aspiration from oropharyngeal dysphagia vs post prandial aspiration from esophageal dysphagia. General aspiration and reflux precautions.    PATIENT EDUCATION: Education details: different causes of aspiration pneumonia, aspiration with oropharyngeal dysphagia vs post prandial aspiration, oropharyngeal dysphagia vs esophageal dysphagia Person educated: Patient Education method: Explanation Education comprehension: verbalized understanding    ASSESSMENT:  CLINICAL IMPRESSION: Patient is a 58 y.o. old who was seen today for a clinical swallow evaluation d/t recurrent aspiration pneumonia. Pt presents with adequate oropharyngeal abilities when consuming puree, solids, mixed consistencies and thin liquids via cup. She was free of any overt s/s of aspiration during consumption.   Prior to consumption, pt had an intermittent dry cough that subsided greatly with use of pectin based throat lozenge (provided by this writer) as well as when sipping on water. At no time during PO consumption was coughing related to PO intake. While  silent aspiration cannot be ruled out without an instrumental swallow study, pt has not exhibited any aspiration on multiple Modified Barium Swallow Studies nor the recent DG Esophagus.   She reports globus sensation, early satiety, and substantial increase in coughing following PO intake (it will start about 5 minutes after I eat or drink). During this evaluation, pt was observed with an almost continuous dry hacking involuntary cough that prevented her from completing a verbal sentence d/t frequency of cough.      At this time, pt's risk of aspiration from oropharyngeal dysphagia appears reduced when following general aspiration precautions.    PLAN: Pt to follow-up with GI for esophageal evaluation. Can provide re-evaluation if still warranted following that.     Kaleena Corrow B. Rubbie, M.S., CCC-SLP, Tree surgeon Certified Brain Injury Specialist [facility]  [facility] Rehabilitation Services Office [phone] Ascom [phone] Fax [phone]

## 2023-10-31 ENCOUNTER — Encounter: Payer: Self-pay | Admitting: Family Medicine

## 2023-10-31 ENCOUNTER — Other Ambulatory Visit: Payer: Self-pay | Admitting: Family Medicine

## 2023-11-01 ENCOUNTER — Telehealth: Payer: Self-pay | Admitting: Family Medicine

## 2023-11-01 ENCOUNTER — Encounter: Payer: Self-pay | Admitting: Family Medicine

## 2023-11-01 ENCOUNTER — Ambulatory Visit: Admitting: Family Medicine

## 2023-11-01 ENCOUNTER — Ambulatory Visit: Payer: Self-pay

## 2023-11-01 VITALS — BP 126/74 | HR 103 | Resp 16 | Ht 65.0 in | Wt 185.0 lb

## 2023-11-01 DIAGNOSIS — R053 Chronic cough: Secondary | ICD-10-CM | POA: Diagnosis not present

## 2023-11-01 DIAGNOSIS — L03114 Cellulitis of left upper limb: Secondary | ICD-10-CM

## 2023-11-01 DIAGNOSIS — J398 Other specified diseases of upper respiratory tract: Secondary | ICD-10-CM | POA: Diagnosis not present

## 2023-11-01 DIAGNOSIS — T22012A Burn of unspecified degree of left forearm, initial encounter: Secondary | ICD-10-CM | POA: Diagnosis not present

## 2023-11-01 DIAGNOSIS — Z79899 Other long term (current) drug therapy: Secondary | ICD-10-CM

## 2023-11-01 DIAGNOSIS — R000 Tachycardia, unspecified: Secondary | ICD-10-CM

## 2023-11-01 MED ORDER — METOPROLOL TARTRATE 25 MG PO TABS: 25.0000 mg | ORAL_TABLET | ORAL | 3 refills | Status: DC

## 2023-11-01 MED ORDER — SILVER SULFADIAZINE 1 % EX CREA: 1.0000 | TOPICAL_CREAM | Freq: Every day | CUTANEOUS | 0 refills | Status: DC

## 2023-11-01 MED ORDER — CEPHALEXIN 500 MG PO CAPS: 500.0000 mg | ORAL_CAPSULE | Freq: Two times a day (BID) | ORAL | 0 refills | Status: AC

## 2023-11-01 MED ORDER — METOPROLOL TARTRATE 25 MG PO TABS: 25.0000 mg | ORAL_TABLET | ORAL | 3 refills | Status: AC
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
## 2023-11-01 NOTE — Telephone Encounter (Unsigned)
 Copied from CRM [id number]. Topic: Clinical - Prescription Issue >> Nov 01, 2023  9:16 AM Larissa RAMAN wrote: Reason for CRM: Lonell with [facility] states that prescription for chlorpheniramine-HYDROcodone  (TUSSIONEX) 10-8 MG/62M will not be filled as patient is going to multiple providers to receive this medication. She states that if provider is wanting patient to have this medication she will have to send it to a different pharmacy due to patient abusing this medication.   Callback # [phone]
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
## 2023-11-04 ENCOUNTER — Encounter: Payer: Self-pay | Admitting: Family Medicine

## 2023-11-11 ENCOUNTER — Encounter: Payer: Self-pay | Admitting: Family Medicine

## 2023-11-25 ENCOUNTER — Encounter: Payer: Self-pay | Admitting: Family Medicine

## 2023-11-25 ENCOUNTER — Other Ambulatory Visit: Payer: Self-pay

## 2023-11-25 DIAGNOSIS — R053 Chronic cough: Secondary | ICD-10-CM

## 2023-11-25 DIAGNOSIS — J398 Other specified diseases of upper respiratory tract: Secondary | ICD-10-CM

## 2023-11-26 ENCOUNTER — Other Ambulatory Visit: Payer: Self-pay | Admitting: Family Medicine

## 2023-11-26 DIAGNOSIS — J398 Other specified diseases of upper respiratory tract: Secondary | ICD-10-CM

## 2023-11-26 DIAGNOSIS — R053 Chronic cough: Secondary | ICD-10-CM

## 2023-11-26 NOTE — Telephone Encounter (Unsigned)
 Copied from CRM [id number]. Topic: Clinical - Medication Refill >> Nov 26, 2023 11:03 AM Myrick T wrote: Medication: chlorpheniramine-HYDROcodone  (TUSSIONEX) 10-8 MG/5ML  Has the patient contacted their pharmacy? No  This is the patient's preferred pharmacy:  [id number] - [address] [place] [address] Phone: [phone] Fax: [phone]  Is this the correct pharmacy for this prescription? Yes  Has the prescription been filled recently? No  Is the patient out of the medication? Yes  Has the patient been seen for an appointment in the last year OR does the patient have an upcoming appointment? Yes  Can we respond through MyChart? Yes  Agent: Please be advised that Rx refills may take up to 3 business days. We ask that you follow-up with your pharmacy.
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
## 2023-11-28 ENCOUNTER — Ambulatory Visit: Admitting: Family Medicine

## 2023-11-28 ENCOUNTER — Other Ambulatory Visit: Payer: Self-pay

## 2023-11-28 ENCOUNTER — Encounter: Payer: Self-pay | Admitting: Family Medicine

## 2023-11-28 DIAGNOSIS — R053 Chronic cough: Secondary | ICD-10-CM

## 2023-11-28 DIAGNOSIS — J398 Other specified diseases of upper respiratory tract: Secondary | ICD-10-CM

## 2023-11-28 MED ORDER — HYDROCOD POLI-CHLORPHE POLI ER 10-8 MG/5ML PO SUER: 5.0000 mL | Freq: Two times a day (BID) | ORAL | 0 refills | Status: DC | PRN

## 2023-12-02 ENCOUNTER — Encounter: Payer: Self-pay | Admitting: Gastroenterology

## 2023-12-02 ENCOUNTER — Ambulatory Visit

## 2023-12-02 ENCOUNTER — Ambulatory Visit
Admission: RE | Admit: 2023-12-02 | Discharge: 2023-12-02 | Disposition: A | Discharge status: Home / Self Care | Attending: Gastroenterology | Admitting: Gastroenterology

## 2023-12-02 ENCOUNTER — Encounter
Admission: RE | Disposition: A | Discharge status: Home / Self Care | Payer: Self-pay | Source: Home / Self Care | Attending: Gastroenterology

## 2023-12-02 DIAGNOSIS — Q8501 Neurofibromatosis, type 1: Secondary | ICD-10-CM | POA: Insufficient documentation

## 2023-12-02 DIAGNOSIS — K219 Gastro-esophageal reflux disease without esophagitis: Secondary | ICD-10-CM | POA: Insufficient documentation

## 2023-12-02 DIAGNOSIS — E785 Hyperlipidemia, unspecified: Secondary | ICD-10-CM | POA: Insufficient documentation

## 2023-12-02 DIAGNOSIS — K3189 Other diseases of stomach and duodenum: Secondary | ICD-10-CM | POA: Insufficient documentation

## 2023-12-02 DIAGNOSIS — M797 Fibromyalgia: Secondary | ICD-10-CM | POA: Diagnosis not present

## 2023-12-02 DIAGNOSIS — I10 Essential (primary) hypertension: Secondary | ICD-10-CM | POA: Insufficient documentation

## 2023-12-02 DIAGNOSIS — G4730 Sleep apnea, unspecified: Secondary | ICD-10-CM | POA: Diagnosis not present

## 2023-12-02 DIAGNOSIS — R1310 Dysphagia, unspecified: Secondary | ICD-10-CM | POA: Insufficient documentation

## 2023-12-02 DIAGNOSIS — K224 Dyskinesia of esophagus: Secondary | ICD-10-CM | POA: Diagnosis not present

## 2023-12-02 DIAGNOSIS — K2970 Gastritis, unspecified, without bleeding: Secondary | ICD-10-CM | POA: Diagnosis not present

## 2023-12-02 DIAGNOSIS — E039 Hypothyroidism, unspecified: Secondary | ICD-10-CM | POA: Diagnosis not present

## 2023-12-02 DIAGNOSIS — Z87891 Personal history of nicotine dependence: Secondary | ICD-10-CM | POA: Diagnosis not present

## 2023-12-02 DIAGNOSIS — E119 Type 2 diabetes mellitus without complications: Secondary | ICD-10-CM | POA: Insufficient documentation

## 2023-12-02 DIAGNOSIS — J449 Chronic obstructive pulmonary disease, unspecified: Secondary | ICD-10-CM | POA: Diagnosis not present

## 2023-12-02 HISTORY — PX: ESOPHAGOGASTRODUODENOSCOPY: SHX5428

## 2023-12-02 LAB — GLUCOSE, CAPILLARY: Glucose-Capillary: 104 mg/dL — ABNORMAL HIGH (ref 70–99)

## 2023-12-02 SURGERY — EGD (ESOPHAGOGASTRODUODENOSCOPY)
Anesthesia: General

## 2023-12-02 MED ORDER — SODIUM CHLORIDE 0.9 % IV SOLN
INTRAVENOUS | Status: DC
  Administered 2023-12-02 (×2): 20 mL/h via INTRAVENOUS

## 2023-12-02 MED ORDER — PHENYLEPHRINE 80 MCG/ML (10ML) SYRINGE FOR IV PUSH (FOR BLOOD PRESSURE SUPPORT)
PREFILLED_SYRINGE | INTRAVENOUS | Status: DC | PRN
Start: 2023-12-02 — End: 2023-12-02
  Administered 2023-12-02 (×2): 160 ug via INTRAVENOUS

## 2023-12-02 MED ORDER — PHENYLEPHRINE 80 MCG/ML (10ML) SYRINGE FOR IV PUSH (FOR BLOOD PRESSURE SUPPORT)
PREFILLED_SYRINGE | INTRAVENOUS | Status: AC
  Filled 2023-12-02: qty 10

## 2023-12-02 MED ORDER — PROPOFOL 500 MG/50ML IV EMUL
INTRAVENOUS | Status: DC | PRN
  Administered 2023-12-02: 100 mg via INTRAVENOUS
  Administered 2023-12-02 (×2): 200 ug/kg/min via INTRAVENOUS
  Administered 2023-12-02: 100 mg via INTRAVENOUS

## 2023-12-02 MED ORDER — LIDOCAINE HCL (PF) 2 % IJ SOLN
INTRAMUSCULAR | Status: DC | PRN
  Administered 2023-12-02 (×2): 100 mg via INTRADERMAL

## 2023-12-02 NOTE — H&P (Signed)
 Pre-Procedure H&P   Patient ID: Jenna Tyler is a 58 y.o. female.  Gastroenterology Provider: Elspeth Ozell Jungling, DO  PCP: Leavy Mole, PA-C  Date: 12/02/2023  HPI [name] is a 58 y.o. female who presents today for Esophagogastroduodenoscopy for dysphagia, abnormal barium swallow study .  Patient originally scheduled for colonoscopy today as well, but unable to complete prep due to nausea.  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  She underwent a swallow study that was abnormal and demonstrated esophageal dysmotility and stasis.  She currently has difficulty with solids and liquids and has had recurrent aspiration pneumonia.  No nausea vomiting weight loss or appetite changes.  Reflux is well-controlled on twice a day PPI.  She takes 800 mg of ibuprofen a couple times a week. Last EGD and colonoscopy in 2017 only remarkable for duodenitis.  Biopsies negative for H. Pylori  Status post hysterectomy cholecystectomy and appendectomy  Hemoglobin 16.1 MCV 82 Cr 0.6 platelets 274,000  Personal history of duodenal GIST status post resection in 2016.  Neurofibromatosis 1   Past Medical History:  Diagnosis Date   AKI (acute kidney injury) (HCC) 07/12/2017   Allergy    Asthma    COPD (chronic obstructive pulmonary disease) (HCC)    COPD exacerbation (HCC) 12/29/2016   COPD with acute exacerbation (HCC) 10/02/2022   DM (diabetes mellitus), type 2 (HCC)    Dyspnea    Esophageal dysmotility    Fibromyalgia    GERD (gastroesophageal reflux disease)    Guillain Barr syndrome (HCC)    Headache    Hyperlipidemia    Hypertension    Hypothyroidism    Neurofibromatosis, peripheral, NF1 (HCC) 2017   Pancreatitis    PONV (postoperative nausea and vomiting)    Pulmonary nodule    RLS (restless legs syndrome)    Sleep apnea    never went to pick up her cpap   Tachycardia    Tracheomalacia     Past Surgical  History:  Procedure Laterality Date   ABDOMINAL HYSTERECTOMY     APPENDECTOMY  2023   BREAST BIOPSY Right 2012   negative   BRONCHIAL DILITATION  07/17/2023   BRONCHIAL WASHINGS N/A 09/28/2022   Procedure: BRONCHIAL WASHINGS;  Surgeon: Parris Manna, MD;  Location: [facility] ORS;  Service: Thoracic;  Laterality: N/A;   CESAREAN SECTION  2020   CHOLECYSTECTOMY     COLON SURGERY     FLEXIBLE BRONCHOSCOPY N/A 09/28/2022   Procedure: FLEXIBLE BRONCHOSCOPY;  Surgeon: Parris Manna, MD;  Location: [facility] ORS;  Service: Thoracic;  Laterality: N/A;   FOOT SURGERY     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   OTHER SURGICAL HISTORY     excision of lipoma   OTHER SURGICAL HISTORY     Abdominal surgery   SMALL INTESTINE SURGERY  2026   THYROID  SURGERY     thyroidectomy   TUBAL LIGATION     XI ROBOTIC LAPAROSCOPIC ASSISTED APPENDECTOMY N/A 04/08/2020   Procedure: XI ROBOTIC LAPAROSCOPIC ASSISTED APPENDECTOMY;  Surgeon: Tye Millet, DO;  Location: [facility] ORS;  Service: General;  Laterality: N/A;    Family History Sister-liver cancer No other h/o GI disease or malignancy  Review of Systems  Constitutional:  Negative for activity change, appetite change, chills, diaphoresis, fatigue, fever and unexpected weight change.  HENT:  Positive for trouble swallowing. Negative for voice change.   Respiratory:  Negative for shortness of breath and wheezing.   Cardiovascular:  Negative for chest  pain, palpitations and leg swelling.  Gastrointestinal:  Negative for abdominal distention, abdominal pain, anal bleeding, blood in stool, constipation, diarrhea, nausea, rectal pain and vomiting.  Musculoskeletal:  Negative for arthralgias and myalgias.  Skin:  Negative for color change and pallor.  Neurological:  Negative for dizziness, syncope and weakness.  Psychiatric/Behavioral:  Negative for confusion.   All other systems reviewed and are negative.    Medications No current facility-administered  medications on file prior to encounter.   Current Outpatient Medications on File Prior to Encounter  Medication Sig Dispense Refill   albuterol  (PROVENTIL ) (2.5 MG/3ML) 0.083% nebulizer solution Take 2.5 mg by nebulization every 4 (four) hours as needed for wheezing or shortness of breath.     budesonide  (PULMICORT ) 0.25 MG/2ML nebulizer solution Take 0.25 mg by nebulization daily as needed.     chlorpheniramine-HYDROcodone  (TUSSIONEX) 10-8 MG/5ML Take 5 mLs by mouth every 12 (twelve) hours as needed for cough. 240 mL 0   doxepin (SINEQUAN) 25 MG capsule Take 25 mg by mouth at bedtime.     gabapentin  (NEURONTIN ) 800 MG tablet Take 800 mg by mouth 2 (two) times daily.     ibuprofen (ADVIL) 200 MG tablet Take 200 mg by mouth every 6 (six) hours as needed for headache, mild pain (pain score 1-3) or fever.     levothyroxine  (SYNTHROID ) 100 MCG tablet Take 1 tablet (100 mcg total) by mouth daily before breakfast. 90 tablet 1   methocarbamol  (ROBAXIN ) 750 MG tablet Take 750 mg by mouth 2 (two) times daily.     metoprolol  tartrate (LOPRESSOR ) 25 MG tablet Take 1 tablet (25 mg total) by mouth every morning. 90 tablet 3   ondansetron  (ZOFRAN -ODT) 4 MG disintegrating tablet Take 1 tablet (4 mg total) by mouth every 6 (six) hours as needed for nausea or vomiting. 20 tablet 0   pantoprazole  (PROTONIX ) 40 MG tablet Take 1 tablet (40 mg total) by mouth 2 (two) times daily. 180 tablet 0   Respiratory Therapy Supplies (NEBULIZER/TUBING/MOUTHPIECE) KIT Disp one nebulizer machine, tubing set and mouthpiece kit 1 kit 0   silver  sulfADIAZINE  (SILVADENE ) 1 % cream Apply 1 Application topically daily. 50 g 0   SYMBICORT  160-4.5 MCG/ACT inhaler Inhale 2 puffs into the lungs 2 (two) times daily.     Blood Glucose Monitoring Suppl (GLUCOCOM BLOOD GLUCOSE MONITOR) DEVI 1 each as directed     rOPINIRole  (REQUIP ) 0.5 MG tablet Take 1 mg by mouth at bedtime. (Patient not taking: Reported on 11/01/2023)      Pertinent  medications related to GI and procedure were reviewed by me with the patient prior to the procedure   Current Facility-Administered Medications:    0.9 %  sodium chloride  infusion, , Intravenous, Continuous, Onita Elspeth Sharper, DO, Last Rate: 20 mL/hr at 12/02/23 1339, 20 mL/hr at 12/02/23 1339  sodium chloride  20 mL/hr (12/02/23 1339)       Allergies  Allergen Reactions   Nsaids Other (See Comments)    Internal bleeding  Intestinal bleeding   Paxlovid [Nirmatrelvir-Ritonavir]    Tizanidine  Other (See Comments)    Visual hallucinations   Adhesive [Tape] Rash   Aspirin Other (See Comments)    Cannot take due to ulcers.   Penicillins Rash    Has patient had a PCN reaction causing immediate rash, facial/tongue/throat swelling, SOB or lightheadedness with hypotension: Yes Has patient had a PCN reaction causing severe rash involving mucus membranes or skin necrosis: No Has patient had a PCN reaction that required hospitalization:  No Has patient had a PCN reaction occurring within the last 10 years: Yes If all of the above answers are NO, then may proceed with Cephalosporin use.  Patient tolerated ANCEF  administration    Wound Dressing Adhesive Rash    Paper Tape is tolerated   Allergies were reviewed by me prior to the procedure  Objective   Body mass index is 30.55 kg/m. Vitals:   12/02/23 1320  BP: 136/85  Pulse: 87  Resp: 20  Temp: (!) 96.1 F (35.6 C)  TempSrc: Temporal  SpO2: 96%  Weight: 83.3 kg     Physical Exam Vitals and nursing note reviewed.  Constitutional:      General: She is not in acute distress.    Appearance: Normal appearance. She is not ill-appearing, toxic-appearing or diaphoretic.  HENT:     Head: Normocephalic and atraumatic.     Nose: Nose normal.     Mouth/Throat:     Mouth: Mucous membranes are moist.     Pharynx: Oropharynx is clear.  Eyes:     General: No scleral icterus.    Extraocular Movements: Extraocular movements intact.   Cardiovascular:     Rate and Rhythm: Normal rate and regular rhythm.     Heart sounds: Normal heart sounds. No murmur heard.    No friction rub. No gallop.  Pulmonary:     Effort: Pulmonary effort is normal. No respiratory distress.     Breath sounds: Normal breath sounds. No wheezing, rhonchi or rales.  Abdominal:     General: Bowel sounds are normal. There is no distension.     Palpations: Abdomen is soft.     Tenderness: There is no abdominal tenderness. There is no guarding or rebound.  Musculoskeletal:     Cervical back: Neck supple.     Right lower leg: No edema.     Left lower leg: No edema.  Skin:    General: Skin is warm and dry.     Coloration: Skin is not jaundiced or pale.  Neurological:     General: No focal deficit present.     Mental Status: She is alert and oriented to person, place, and time. Mental status is at baseline.  Psychiatric:        Mood and Affect: Mood normal.        Behavior: Behavior normal.        Thought Content: Thought content normal.        Judgment: Judgment normal.      Assessment:  [name] is a 58 y.o. female  who presents today for Esophagogastroduodenoscopy for dysphagia, abnormal barium swallow study .  Plan:  Esophagogastroduodenoscopy with possible intervention today  Esophagogastroduodenoscopy with possible biopsy, control of bleeding, polypectomy, and interventions as necessary has been discussed with the patient/patient representative. Informed consent was obtained from the patient/patient representative after explaining the indication, nature, and risks of the procedure including but not limited to death, bleeding, perforation, missed neoplasm/lesions, cardiorespiratory compromise, and reaction to medications. Opportunity for questions was given and appropriate answers were provided. Patient/patient representative has verbalized understanding is amenable to undergoing the procedure.   Elspeth Ozell Jungling, DO   [facility] - Inpatient Gastroenterology  Portions of the record may have been created with voice recognition software. Occasional wrong-word or 'sound-a-like' substitutions may have occurred due to the inherent limitations of voice recognition software.  Read the chart carefully and recognize, using context, where substitutions may have occurred.

## 2023-12-02 NOTE — Interval H&P Note (Signed)
 History and Physical Interval Note: Preprocedure H&P from 12/02/23  was reviewed and there was no interval change after seeing and examining the patient.  Written consent was obtained from the patient after discussion of risks, benefits, and alternatives. 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  The various methods of treatment have been discussed with the patient and family. After consideration of risks, benefits and other options for treatment, the patient has consented to  Procedure(s): EGD (ESOPHAGOGASTRODUODENOSCOPY) (N/A) as a surgical intervention.  The patient's history has been reviewed, patient examined, no change in status, stable for surgery.  I have reviewed the patient's chart and labs.  Questions were answered to the patient's satisfaction.     Elspeth Ozell Jungling

## 2023-12-02 NOTE — Op Note (Signed)
 [facility] Gastroenterology Patient Name: Jenna Tyler Procedure Date: 12/02/2023 2:23 PM MRN: [id number] Account #: [id number] Date of Birth: [birth year] Admit Type: Outpatient Age: 58 Room: [facility] ENDO ROOM 2 Gender: Female Note Status: Supervisor Override Instrument Name: Barnie GI Scope [id number] Procedure:             Upper GI endoscopy Indications:           Dysphagia, Suspected malignant gastrointestinal                         stromal tumor of the stomach, Abnormal UGI series,                         Neurofibromatosis, Type 1 Providers:             Elspeth Ozell Jungling DO, DO Referring MD:          Michelene Cower (Referring MD) Medicines:             Monitored Anesthesia Care Complications:         No immediate complications. Estimated blood loss:                         Minimal. Procedure:             Pre-Anesthesia Assessment:                        - Prior to the procedure, a History and Physical was                         performed, and patient medications and allergies were                         reviewed. The patient is competent. The risks and                         benefits of the procedure and the sedation options and                         risks were discussed with the patient. All questions                         were answered and informed consent was obtained.                         Patient identification and proposed procedure were                         verified by the physician, the nurse, the anesthetist                         and the technician in the endoscopy suite. Mental                         Status Examination: alert and oriented. Airway                         Examination: normal oropharyngeal airway and neck  mobility. Respiratory Examination: clear to                         auscultation. CV Examination: RRR, no murmurs, no S3                         or S4. Prophylactic Antibiotics: The patient does not                          require prophylactic antibiotics. Prior                         Anticoagulants: The patient has taken no anticoagulant                         or antiplatelet agents. ASA Grade Assessment: III - A                         patient with severe systemic disease. After reviewing                         the risks and benefits, the patient was deemed in                         satisfactory condition to undergo the procedure. The                         anesthesia plan was to use monitored anesthesia care                         (MAC). Immediately prior to administration of                         medications, the patient was re-assessed for adequacy                         to receive sedatives. The heart rate, respiratory                         rate, oxygen saturations, blood pressure, adequacy of                         pulmonary ventilation, and response to care were                         monitored throughout the procedure. The physical                         status of the patient was re-assessed after the                         procedure.                        After obtaining informed consent, the endoscope was                         passed under direct vision. Throughout the procedure,  the patient's blood pressure, pulse, and oxygen                         saturations were monitored continuously. The Endoscope                         was introduced through the mouth, and advanced to the                         second part of duodenum. The upper GI endoscopy was                         accomplished without difficulty. The patient tolerated                         the procedure well. Findings:      A moderate near ampulla deformity was found in the second portion of the       duodenum. Biopsies were taken with a cold forceps for histology.       Estimated blood loss was minimal.      The exam of the duodenum was otherwise normal.       Localized mild inflammation characterized by erythema was found in the       gastric antrum. Biopsies were taken with a cold forceps for Helicobacter       pylori testing. Estimated blood loss was minimal.      The exam of the stomach was otherwise normal.      The Z-line was regular. Estimated blood loss: none.      Esophagogastric landmarks were identified: the gastroesophageal junction       was found at 40 cm from the incisors.      Normal mucosa was found in the entire esophagus. Biopsies were obtained       from the proximal and distal esophagus with cold forceps for histology       of suspected eosinophilic esophagitis. Estimated blood loss was minimal.      Abnormal motility was noted in the esophagus. The cricopharyngeus was       normal. There is spasticity of the esophageal body. The distal       esophagus/lower esophageal sphincter is open. lumen collapsed ~25cm from       incisors The scope was withdrawn. Dilation was performed with a Maloney       dilator with no resistance at 52 Fr. The dilation site was examined       following endoscope reinsertion and showed no change. Estimated blood       loss: none.      The exam of the esophagus was otherwise normal. Impression:            - Duodenal deformity. Biopsied.                        - Gastritis. Biopsied.                        - Z-line regular.                        - Esophagogastric landmarks identified.                        -  Normal mucosa was found in the entire esophagus.                        - Abnormal esophageal motility. Dilated.                        - Biopsies were taken with a cold forceps for                         evaluation of eosinophilic esophagitis. Recommendation:        - Patient has a contact number available for                         emergencies. The signs and symptoms of potential                         delayed complications were discussed with the patient.                         Return to  normal activities tomorrow. Written                         discharge instructions were provided to the patient.                        - Discharge patient to home.                        - Resume previous diet.                        - Continue present medications.                        - Await pathology results.                        - Return to GI clinic as previously scheduled.                        - Plan for motility and manometry study                        - The findings and recommendations were discussed with                         the patient. Procedure Code(s):     --- Professional ---                        [phone], Esophagogastroduodenoscopy, flexible,                         transoral; with biopsy, single or multiple                        [id number], Dilation of esophagus, by unguided sound or                         bougie, single or multiple passes Diagnosis Code(s):     --- Professional ---  K31.89, Other diseases of stomach and duodenum                        K29.70, Gastritis, unspecified, without bleeding                        K22.4, Dyskinesia of esophagus                        R13.10, Dysphagia, unspecified                        R93.3, Abnormal findings on diagnostic imaging of                         other parts of digestive tract CPT copyright 2022 American Medical Association. All rights reserved. The codes documented in this report are preliminary and upon coder review may  be revised to meet current compliance requirements. Attending Participation:      I personally performed the entire procedure. Elspeth Jungling, DO Elspeth Ozell Jungling DO, DO 12/02/2023 3:03:17 PM This report has been signed electronically. Number of Addenda: 0 Note Initiated On: 12/02/2023 2:23 PM Estimated Blood Loss:  Estimated blood loss was minimal.      [facility]

## 2023-12-02 NOTE — Anesthesia Preprocedure Evaluation (Signed)
 Anesthesia Evaluation  Patient identified by MRN, date of birth, ID band Patient awake    Reviewed: Allergy & Precautions, H&P , NPO status , Patient's Chart, lab work & pertinent test results, reviewed documented beta blocker date and time   History of Anesthesia Complications (+) PONV and history of anesthetic complications  Airway Mallampati: II   Neck ROM: full    Dental  (+) Poor Dentition   Pulmonary neg shortness of breath, asthma , sleep apnea , pneumonia, resolved, COPD,  COPD inhaler, former smoker   Pulmonary exam normal        Cardiovascular Exercise Tolerance: Poor hypertension, On Medications negative cardio ROS Normal cardiovascular exam Rhythm:regular Rate:Normal     Neuro/Psych  Headaches, Seizures -, Well Controlled,  PSYCHIATRIC DISORDERS Anxiety Depression     Neuromuscular disease    GI/Hepatic Neg liver ROS, PUD,GERD  Medicated,,  Endo/Other  diabetesHypothyroidism    Renal/GU Renal disease  negative genitourinary   Musculoskeletal   Abdominal   Peds  Hematology negative hematology ROS (+)   Anesthesia Other Findings Past Medical History: 07/12/2017: AKI (acute kidney injury) (HCC) No date: Allergy No date: Asthma No date: COPD (chronic obstructive pulmonary disease) (HCC) 12/29/2016: COPD exacerbation (HCC) 10/02/2022: COPD with acute exacerbation (HCC) No date: DM (diabetes mellitus), type 2 (HCC) No date: Dyspnea No date: Esophageal dysmotility No date: Fibromyalgia No date: GERD (gastroesophageal reflux disease) No date: Guillain Barr syndrome (HCC) No date: Headache No date: Hyperlipidemia No date: Hypertension No date: Hypothyroidism 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, peripheral, NF1 (HCC) No date: Pancreatitis No date: PONV (postoperative nausea and vomiting) No date: Pulmonary nodule No date: RLS (restless legs syndrome) No date: Sleep apnea     Comment:  never went to pick up  her cpap No date: Tachycardia No date: Tracheomalacia Past Surgical History: No date: ABDOMINAL HYSTERECTOMY 2023: APPENDECTOMY 2012: BREAST BIOPSY; Right     Comment:  negative 07/17/2023: BRONCHIAL DILITATION 09/28/2022: BRONCHIAL WASHINGS; N/A     Comment:  Procedure: BRONCHIAL WASHINGS;  Surgeon: Parris Manna, MD;  Location: [facility] ORS;  Service: Thoracic;                Laterality: N/A; 2020: CESAREAN SECTION No date: CHOLECYSTECTOMY No date: COLON SURGERY 09/28/2022: FLEXIBLE BRONCHOSCOPY; N/A     Comment:  Procedure: FLEXIBLE BRONCHOSCOPY;  Surgeon: Parris Manna, MD;  Location: [facility] ORS;  Service: Thoracic;                Laterality: N/A; No date: FOOT SURGERY 06/2014: Gastrintestinal Stoma tumor No date: OTHER SURGICAL HISTORY     Comment:  excision of lipoma No date: OTHER SURGICAL HISTORY     Comment:  Abdominal surgery 2026: SMALL INTESTINE SURGERY No date: THYROID  SURGERY     Comment:  thyroidectomy No date: TUBAL LIGATION 04/08/2020: XI ROBOTIC LAPAROSCOPIC ASSISTED APPENDECTOMY; N/A     Comment:  Procedure: XI ROBOTIC LAPAROSCOPIC ASSISTED               APPENDECTOMY;  Surgeon: Tye Millet, DO;  Location: [facility]              ORS;  Service: General;  Laterality: N/A; BMI    Body Mass Index: 30.55 kg/m     Reproductive/Obstetrics negative OB ROS  Anesthesia Physical Anesthesia Plan  ASA: 3  Anesthesia Plan: General   Post-op Pain Management:    Induction:   PONV Risk Score and Plan:   Airway Management Planned:   Additional Equipment:   Intra-op Plan:   Post-operative Plan:   Informed Consent: I have reviewed the patients History and Physical, chart, labs and discussed the procedure including the risks, benefits and alternatives for the proposed anesthesia with the patient or authorized representative who has indicated his/her understanding and acceptance.      Dental Advisory Given  Plan Discussed with: CRNA  Anesthesia Plan Comments:         Anesthesia Quick Evaluation

## 2023-12-02 NOTE — Transfer of Care (Signed)
 Immediate Anesthesia Transfer of Care Note  Patient: Jenna Tyler  Procedure(s) Performed: EGD (ESOPHAGOGASTRODUODENOSCOPY)  Patient Location: PACU and Endoscopy Unit  Anesthesia Type:General  Level of Consciousness: drowsy  Airway & Oxygen Therapy: Patient Spontanous Breathing and Patient connected to nasal cannula oxygen  Post-op Assessment: Report given to RN and Post -op Vital signs reviewed and stable  Post vital signs: Reviewed and stable  Last Vitals:  Vitals Value Taken Time  BP 123/60 12/02/23 14:54  Temp 36.1 C 12/02/23 14:54  Pulse 58 12/02/23 14:55  Resp 13 12/02/23 14:55  SpO2 93 % 12/02/23 14:55  Vitals shown include unfiled device data.  Last Pain:  Vitals:   12/02/23 1454  TempSrc: Temporal  PainSc: Asleep         Complications: There were no known notable events for this encounter.

## 2023-12-03 ENCOUNTER — Encounter: Payer: Self-pay | Admitting: Gastroenterology

## 2023-12-03 LAB — SURGICAL PATHOLOGY

## 2023-12-04 ENCOUNTER — Encounter: Payer: Self-pay | Admitting: Family Medicine

## 2023-12-05 NOTE — Anesthesia Postprocedure Evaluation (Signed)
 Anesthesia Post Note  Patient: Courtlyn Aki  Procedure(s) Performed: EGD (ESOPHAGOGASTRODUODENOSCOPY)  Patient location during evaluation: PACU Anesthesia Type: General Level of consciousness: awake and alert Pain management: pain level controlled Vital Signs Assessment: post-procedure vital signs reviewed and stable Respiratory status: spontaneous breathing, nonlabored ventilation, respiratory function stable and patient connected to nasal cannula oxygen Cardiovascular status: blood pressure returned to baseline and stable Postop Assessment: no apparent nausea or vomiting Anesthetic complications: no   There were no known notable events for this encounter.   Last Vitals:  Vitals:   12/02/23 1510 12/02/23 1520  BP: 115/72   Pulse: 67   Resp: 17 19  Temp:    SpO2: 95%     Last Pain:  Vitals:   12/02/23 1510  TempSrc:   PainSc: 0-No pain                 Lynwood KANDICE Clause

## 2023-12-12 ENCOUNTER — Ambulatory Visit: Payer: Self-pay

## 2023-12-12 NOTE — Telephone Encounter (Signed)
 FYI Only or Action Required?: Action required by provider: request for appointment.  Patient was last seen in primary care on .  Called Nurse Triage reporting Back Pain.  Symptoms began yesterday.  Interventions attempted: OTC medications: ibprofen.  Symptoms are: unchanged.  Triage Disposition: See PCP When Office is Open (Within 3 Days)  Patient/caregiver understands and will follow disposition?: YesCopied from CRM #[id number]. Topic: Clinical - Red Word Triage >> Dec 12, 2023  1:53 PM Kevelyn M wrote: Red Word that prompted transfer to Nurse Triage: Back and rib pain. Rib pain started the day before and the back pain started yesterday. Frequent urination but when she urination it's very little. Reason for Disposition  [1] MODERATE back pain (e.g., interferes with normal activities) AND [2] present > 3 days  Answer Assessment - Initial Assessment Questions Trouble with urination/increase in frequency. Pt feels the back pain is from possible UTI.     1. ONSET: When did the pain begin? (e.g., minutes, hours, days)     yesterday 2. LOCATION: Where does it hurt? (upper, mid or lower back)     Flank-right 3. SEVERITY: How bad is the pain?  (e.g., Scale 1-10; mild, moderate, or severe)     3-5 4. PATTERN: Is the pain constant? (e.g., yes, no; constant, intermittent)      Comes and goes 5. RADIATION: Does the pain shoot into your legs or somewhere else?     denies 6. CAUSE:  What do you think is causing the back pain?      Possible UTI 7. BACK OVERUSE:  Any recent lifting of heavy objects, strenuous work or exercise?     na 8. MEDICINES: What have you taken so far for the pain? (e.g., nothing, acetaminophen , NSAIDS)    Ibuprofen  9. NEUROLOGIC SYMPTOMS: Do you have any weakness, numbness, or problems with bowel/bladder control?    denies 10. OTHER SYMPTOMS: Do you have any other symptoms? (e.g., fever, abdomen pain, burning with urination, blood in urine)        Increase in frequency  Protocols used: Back Pain-A-AH

## 2023-12-12 NOTE — Telephone Encounter (Signed)
 Appointment schedule for 8/22

## 2023-12-13 ENCOUNTER — Ambulatory Visit: Admitting: Family Medicine

## 2023-12-13 ENCOUNTER — Encounter: Payer: Self-pay | Admitting: Family Medicine

## 2023-12-13 VITALS — BP 120/70 | HR 86 | Resp 16 | Ht 65.0 in | Wt 177.0 lb

## 2023-12-13 DIAGNOSIS — N361 Urethral diverticulum: Secondary | ICD-10-CM

## 2023-12-13 DIAGNOSIS — R350 Frequency of micturition: Secondary | ICD-10-CM | POA: Diagnosis not present

## 2023-12-13 DIAGNOSIS — N952 Postmenopausal atrophic vaginitis: Secondary | ICD-10-CM

## 2023-12-13 DIAGNOSIS — N3946 Mixed incontinence: Secondary | ICD-10-CM

## 2023-12-13 LAB — POCT URINALYSIS DIPSTICK
Appearance: NORMAL
Bilirubin, UA: NEGATIVE
Blood, UA: NEGATIVE
Glucose, UA: NEGATIVE
Ketones, UA: NEGATIVE
Leukocytes, UA: NEGATIVE
Nitrite, UA: NEGATIVE
Protein, UA: NEGATIVE
Spec Grav, UA: 1.02 (ref 1.010–1.025)
Urobilinogen, UA: 0.2 U/dL
pH, UA: 6 (ref 5.0–8.0)

## 2023-12-13 MED ORDER — ROPINIROLE HCL 0.5 MG PO TABS: ORAL_TABLET | ORAL | 0 refills | Status: DC

## 2023-12-13 MED ORDER — CEPHALEXIN 500 MG PO CAPS: 500.0000 mg | ORAL_CAPSULE | Freq: Two times a day (BID) | ORAL | 0 refills | Status: AC

## 2023-12-13 NOTE — Progress Notes (Signed)
 Patient ID: Jenna Tyler, female    DOB: 1966-04-07, 58 y.o.   MRN: [id number]  PCP: Leavy Mole, PA-C  Chief Complaint  Patient presents with   Urinary Frequency    X1 week   Back Pain    Took OTC Ibuprofen/Tylenol     Subjective:   Jenna Tyler is a 58 y.o. female, presents to clinic with CC of the following:  Urinary Tract Infection    Discussed the use of AI scribe software for clinical note transcription with the patient, who gave verbal consent to proceed.  History of Present Illness Jenna Tyler is a 58 year old female who presents with increased urinary frequency and concerns about a possible urinary tract infection.  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  Musculoskeletal pain and fibromyalgia - Significant musculoskeletal pain attributed to fibromyalgia, recently worsened - Believes musculoskeletal pain may contribute to urinary symptoms - asks for tramadol    Restless leg syndrome and medication use - Gabapentin  800 mg twice daily for leg symptoms and tracheal relation - Previously used Requip  for restless leg syndrome but discontinued due to adverse effects - Considering restarting Requip  due to recurrence of restless leg symptoms  S/p EGD with esophageal dilation, coughing with speaking has improved, still coughing with eating    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   OSA (obstructive sleep apnea) 01/16/2023   Fibromyalgia 03/31/2021   Headache disorder  12/09/2019   Allergic rhinitis, seasonal 07/14/2019   Urethra, 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   Polycythemia 07/02/2019   Seizures (HCC) 01/15/2018   HTN (hypertension) 12/29/2016   COPD (chronic obstructive pulmonary disease) (HCC) 12/29/2016   Alcohol use disorder in remission 12/27/2016   Multiple duodenal ulcers 01/24/2016   Neurofibromatosis, 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   Vitamin D  deficiency 04/04/2015   Anxiety, generalized 10/12/2014   Insomnia, persistent 10/12/2014   Depression, major, recurrent, moderate (HCC) 10/12/2014   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) 10/12/2014   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   Acquired hypothyroidism 07/07/2014   Postmenopausal atrophic vaginitis 11/21/2009   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   Headache, migraine 04/23/1998   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      Current Outpatient Medications:    albuterol  (PROVENTIL ) (2.5 MG/3ML) 0.083% nebulizer solution, Take 2.5 mg by nebulization every 4 (four) hours as needed for wheezing or shortness of breath., Disp: , Rfl:    Blood Glucose Monitoring Suppl (GLUCOCOM BLOOD GLUCOSE MONITOR) DEVI, 1 each as directed, Disp: , Rfl:    budesonide  (PULMICORT ) 0.25 MG/2ML nebulizer solution, Take 0.25 mg by nebulization daily as needed., Disp: , Rfl:    chlorpheniramine-HYDROcodone  (TUSSIONEX) 10-8 MG/5ML, Take 5 mLs by mouth every 12 (twelve) hours as needed for cough., Disp: 240 mL, Rfl: 0   doxepin (SINEQUAN) 25 MG capsule, Take 25 mg by mouth at bedtime., Disp: , Rfl:    gabapentin  (NEURONTIN ) 800 MG tablet, Take 800 mg by mouth 2 (two) times daily., Disp: , Rfl:    ibuprofen (ADVIL) 200 MG tablet, Take 200 mg by mouth every  6 (six) hours as needed for headache, mild pain (pain score 1-3) or fever., Disp: , Rfl:    levothyroxine  (SYNTHROID ) 100 MCG tablet, Take 1 tablet (100 mcg total) by mouth daily before breakfast., Disp: 90 tablet, Rfl: 1    methocarbamol  (ROBAXIN ) 750 MG tablet, Take 750 mg by mouth 2 (two) times daily., Disp: , Rfl:    metoprolol  tartrate (LOPRESSOR ) 25 MG tablet, Take 1 tablet (25 mg total) by mouth every morning., Disp: 90 tablet, Rfl: 3   ondansetron  (ZOFRAN -ODT) 4 MG disintegrating tablet, Take 1 tablet (4 mg total) by mouth every 6 (six) hours as needed for nausea or vomiting., Disp: 20 tablet, Rfl: 0   pantoprazole  (PROTONIX ) 40 MG tablet, Take 1 tablet (40 mg total) by mouth 2 (two) times daily., Disp: 180 tablet, Rfl: 0   Respiratory Therapy Supplies (NEBULIZER/TUBING/MOUTHPIECE) KIT, Disp one nebulizer machine, tubing set and mouthpiece kit, Disp: 1 kit, Rfl: 0   rOPINIRole  (REQUIP ) 0.5 MG tablet, Take 1 mg by mouth at bedtime. (Patient not taking: Reported on 11/01/2023), Disp: , Rfl:    silver  sulfADIAZINE  (SILVADENE ) 1 % cream, Apply 1 Application topically daily., Disp: 50 g, Rfl: 0   SYMBICORT  160-4.5 MCG/ACT inhaler, Inhale 2 puffs into the lungs 2 (two) times daily., Disp: , Rfl:    Allergies  Allergen Reactions   Nsaids Other (See Comments)    Internal bleeding  Intestinal bleeding   Paxlovid [Nirmatrelvir-Ritonavir]    Tizanidine  Other (See Comments)    Visual hallucinations   Adhesive [Tape] Rash   Aspirin Other (See Comments)    Cannot take due to ulcers.   Penicillins Rash    Has patient had a PCN reaction causing immediate rash, facial/tongue/throat swelling, SOB or lightheadedness with hypotension: Yes Has patient had a PCN reaction causing severe rash involving mucus membranes or skin necrosis: No Has patient had a PCN reaction that required hospitalization: No Has patient had a PCN reaction occurring within the last 10 years: Yes If all of the above answers are NO, then may proceed with Cephalosporin use.  Patient tolerated ANCEF  administration    Wound Dressing Adhesive Rash    Paper Tape is tolerated     Social History   Tobacco Use   Smoking status: Former    Current  packs/day: 0.00    Average packs/day: 1 pack/day for 15.0 years (15.0 ttl pk-yrs)    Types: Cigarettes    Start date: 12/29/1982    Quit date: 12/28/1997    Years since quitting: 25.9   Smokeless tobacco: Never  Vaping Use   Vaping status: Never Used  Substance Use Topics   Alcohol use: Not Currently   Drug use: No      Chart Review Today: I personally reviewed active problem list, medication list, allergies, family history, social history, health maintenance, notes from last encounter, lab results, imaging with the patient/caregiver today.   Review of Systems  Constitutional: Negative.   HENT: Negative.    Eyes: Negative.   Respiratory: Negative.    Cardiovascular: Negative.   Gastrointestinal: Negative.   Endocrine: Negative.   Genitourinary: Negative.   Musculoskeletal: Negative.   Skin: Negative.   Allergic/Immunologic: Negative.   Neurological: Negative.   Hematological: Negative.   Psychiatric/Behavioral: Negative.    All other systems reviewed and are negative.      Objective:   There were no vitals filed for this visit.  There is no height or weight on file to calculate BMI.  Physical Exam Vitals  and nursing note reviewed.  Constitutional:      General: She is not in acute distress.    Appearance: Normal appearance. She is well-developed. She is not ill-appearing, toxic-appearing or diaphoretic.  HENT:     Head: Normocephalic and atraumatic.     Right Ear: External ear normal.     Left Ear: External ear normal.     Nose: Nose normal.  Eyes:     General: No scleral icterus.       Right eye: No discharge.        Left eye: No discharge.     Conjunctiva/sclera: Conjunctivae normal.  Neck:     Trachea: No tracheal deviation.  Cardiovascular:     Rate and Rhythm: Normal rate.  Pulmonary:     Effort: Pulmonary effort is normal. No respiratory distress.     Breath sounds: No stridor.  Abdominal:     General: Bowel sounds are normal. There is no  distension.     Palpations: Abdomen is soft.     Tenderness: There is no abdominal tenderness. There is no right CVA tenderness, left CVA tenderness, guarding or rebound.  Skin:    General: Skin is warm and dry.     Findings: No rash.  Neurological:     Mental Status: She is alert.     Motor: No abnormal muscle tone.     Coordination: Coordination normal.     Gait: Gait normal.  Psychiatric:        Mood and Affect: Mood normal.        Behavior: Behavior normal.      Results for orders placed or performed during the hospital encounter of 12/02/23  Surgical pathology   Collection Time: 12/02/23 12:00 AM  Result Value Ref Range   SURGICAL PATHOLOGY      SURGICAL PATHOLOGY [facility] [address] [place], [place] [address] Telephone [phone] or [phone] Fax [phone]  REPORT OF SURGICAL PATHOLOGY   Accession #: [id number] Patient Name: Jenna Tyler, Jenna Tyler Visit # : [id number]  MRN: [id number] Physician: Onita Standing DOB/Age [birth year] (Age: 11) Gender: F Collected Date: 12/02/2023 Received Date: 12/02/2023  FINAL DIAGNOSIS       1. Duodenum, Biopsy, lesion, cbx :       - SCANT DUODENAL MUCOSA WITH REACTIVE CHANGES      - NEGATIVE FOR DYSPLASIA OR MALIGNANCY IN THE SUBMITTED FRAGMENTS      - SEE NOTE       2. Stomach, biopsy, random, cbx :       - GASTRIC ANTRAL AND OXYNTIC MUCOSA WITH NO SPECIFIC HISTOPATHOLOGIC CHANGES      - HELICOBACTER PYLORI-LIKE ORGANISMS ARE NOT IDENTIFIED ON ROUTINE H&E STAIN       3. Esophagus, biopsy, proximal and distal, cbx :       - ESOPHAGEAL SQUAMOUS MUCOSA WITH NO SPECIFIC HISTOPATHOLOGIC CHANGES      - NEGATIVE FOR INCREASED INTRA EPITHELIAL EOSINOPHILS       Diagnosis Note : Additional sampling is suggested if clinically indicated.      ELECTRONIC SIGNATURE : Rebbecca Md, Insurance account manager, International aid/development worker  MICROSCOPIC DESCRIPTION  CASE COMMENTS STAINS USED IN  DIAGNOSIS: H&E H&E H&E    CLINICAL HISTORY  SPECIMEN(S) OBTAINED 1. Duodenum, Biopsy, Lesion, Cbx 2. Stomach, biopsy, Random, Cbx 3. Esophagus, biopsy, Proximal And Distal, Cbx  SPECIMEN COMMENTS: 2. For gastritis 3. For EOE SPECIMEN CLINICAL INFORMATION: 1. 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 Received in formalin are tan, soft tissue fragments that are submitted in toto.Number:  three,  Size:  0.2 cm,  1 block 2. Received in formalin are tan, soft tissue fragments that are submitted in toto.Number:  three,  Size:  0.5 cm,  1 block 3. Received in formali n are tan, soft tissue fragments that are submitted in toto.Number:  4,   Size:  0.3 cm,  1 block.mb 12-02-23        Report signed out from the following location(s) [place] [address] [place], [place] [address] CLIA #: [id number]  [facility] [address] [place], [place] [address] CLIA #: [id number]   Glucose, capillary   Collection Time: 12/02/23  1:25 PM  Result Value Ref Range   Glucose-Capillary 104 (H) 70 - 99 mg/dL   Comment 1 IN EPIC        Assessment & Plan:   Assessment & Plan  1. Urinary frequency (Primary) Results for orders placed or performed in visit on 12/13/23  POCT urinalysis dipstick   Collection Time: 12/13/23  1:06 PM  Result Value Ref Range   Color, UA Yellow    Clarity, UA Clear    Glucose, UA Negative Negative   Bilirubin, UA Negative    Ketones, UA Negative    Spec Grav, UA 1.020 1.010 - 1.025   Blood, UA Negative    pH, UA 6.0 5.0 - 8.0   Protein, UA Negative Negative   Urobilinogen, UA 0.2 0.2 or 1.0 E.U./dL   Nitrite, UA Negative    Leukocytes, UA Negative Negative   Appearance Normal    Odor None   Urinary symptoms, rule out urinary tract infection, dip  unremarkable Increased urinary frequency without dysuria or significant pain. Urinalysis negative for infection indicators. Differential includes UTI or fibromyalgia-related symptoms. Urine culture pending. - Send urine culture for analysis. - Prescribe Keflex  to have on hand if symptoms worsen over the weekend. - Refer to [facility] urogynecology if symptoms persist and are not infection-related.  - Urine Culture - POCT urinalysis dipstick  2. Mixed stress and urge urinary incontinence 3. Postmenopausal atrophic vaginitis 4. Urethra, diverticulum With GU hx pt will f/up with [facility] urogyn if needed   Fibromyalgia syndrome Intermittent musculoskeletal pain may contribute to urinary symptoms. Gabapentin  used for esophageal issues and FMS. Discussed non-narcotic management options including Cymbalta, anti-inflammatories, and muscle relaxers. Pt asked for tramadol  but I explained I dont rx that for FMS management, other med options - Continue gabapentin  for esophageal issues. - Consider starting Cymbalta for chronic musculoskeletal pain. - Use anti-inflammatories and muscle relaxers as needed. - Consider referral to pain management or physical medicine rehab if pain becomes severe.  Restless legs syndrome Symptoms returned after stopping Requip . Considering restarting medication. - Prescribe Requip , starting at a lower dose and titrate as needed.  Esophageal dysmotility with dysphagia and gastroesophageal reflux disease Improvement in speech without coughing post-procedure. Dysphagia persists, likely due to esophageal dysmotility and reflux. Gabapentin  used for tracheal relaxation. She wants refill on cough syrup, but got less than 2 weeks ago - per PDMP cannot get filled yet  Recording duration: 15 minutes        Requip  she would like to restart for RLS -  0.25 mg once daily 1 to 3 hours before bedtime. Dose may be increased after 2 days to 0.5 mg once daily before  bedtime, and after 7  days to 1 mg once daily before bedtime    Michelene Cower, PA-C 12/13/23 12:50 PM

## 2023-12-14 LAB — URINE CULTURE
MICRO NUMBER:: 16871619
SPECIMEN QUALITY:: ADEQUATE

## 2023-12-16 ENCOUNTER — Ambulatory Visit: Payer: Self-pay | Admitting: Family Medicine

## 2023-12-19 ENCOUNTER — Encounter: Payer: Self-pay | Admitting: Family Medicine

## 2023-12-19 ENCOUNTER — Other Ambulatory Visit: Payer: Self-pay

## 2023-12-19 DIAGNOSIS — J398 Other specified diseases of upper respiratory tract: Secondary | ICD-10-CM

## 2023-12-19 DIAGNOSIS — R053 Chronic cough: Secondary | ICD-10-CM

## 2023-12-19 MED ORDER — HYDROCOD POLI-CHLORPHE POLI ER 10-8 MG/5ML PO SUER: 5.0000 mL | Freq: Two times a day (BID) | ORAL | 0 refills | Status: DC | PRN

## 2023-12-20 ENCOUNTER — Encounter: Payer: Self-pay | Admitting: Family Medicine

## 2023-12-20 ENCOUNTER — Other Ambulatory Visit: Payer: Self-pay | Admitting: Family Medicine

## 2023-12-20 NOTE — Telephone Encounter (Signed)
 Tried Landscape architect, but line kept ringing w/no answer. Will try again.
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
## 2023-12-24 ENCOUNTER — Ambulatory Visit: Payer: Self-pay

## 2023-12-24 ENCOUNTER — Other Ambulatory Visit: Payer: Self-pay | Admitting: Family Medicine

## 2023-12-24 ENCOUNTER — Other Ambulatory Visit: Payer: Self-pay

## 2023-12-24 ENCOUNTER — Emergency Department

## 2023-12-24 ENCOUNTER — Emergency Department
Admission: EM | Admit: 2023-12-24 | Discharge: 2023-12-24 | Disposition: A | Discharge status: Home / Self Care | Attending: Emergency Medicine | Admitting: Emergency Medicine

## 2023-12-24 ENCOUNTER — Encounter: Payer: Self-pay | Admitting: Family Medicine

## 2023-12-24 ENCOUNTER — Ambulatory Visit: Payer: Self-pay | Admitting: *Deleted

## 2023-12-24 DIAGNOSIS — I10 Essential (primary) hypertension: Secondary | ICD-10-CM | POA: Insufficient documentation

## 2023-12-24 DIAGNOSIS — R1012 Left upper quadrant pain: Secondary | ICD-10-CM | POA: Insufficient documentation

## 2023-12-24 DIAGNOSIS — R1010 Upper abdominal pain, unspecified: Secondary | ICD-10-CM

## 2023-12-24 DIAGNOSIS — J449 Chronic obstructive pulmonary disease, unspecified: Secondary | ICD-10-CM | POA: Diagnosis not present

## 2023-12-24 LAB — CBC
HCT: 44.4 % (ref 36.0–46.0)
Hemoglobin: 14.4 g/dL (ref 12.0–15.0)
MCH: 27.6 pg (ref 26.0–34.0)
MCHC: 32.4 g/dL (ref 30.0–36.0)
MCV: 85.1 fL (ref 80.0–100.0)
Platelets: 271 K/uL (ref 150–400)
RBC: 5.22 MIL/uL — ABNORMAL HIGH (ref 3.87–5.11)
RDW: 13.8 % (ref 11.5–15.5)
WBC: 8.6 K/uL (ref 4.0–10.5)
nRBC: 0 % (ref 0.0–0.2)

## 2023-12-24 LAB — LIPASE, BLOOD: Lipase: 51 U/L (ref 11–51)

## 2023-12-24 LAB — URINALYSIS, ROUTINE W REFLEX MICROSCOPIC
Bacteria, UA: NONE SEEN
Bilirubin Urine: NEGATIVE
Glucose, UA: NEGATIVE mg/dL
Hgb urine dipstick: NEGATIVE
Ketones, ur: NEGATIVE mg/dL
Nitrite: NEGATIVE
Protein, ur: NEGATIVE mg/dL
Specific Gravity, Urine: 1.033 — ABNORMAL HIGH (ref 1.005–1.030)
pH: 5 (ref 5.0–8.0)

## 2023-12-24 LAB — COMPREHENSIVE METABOLIC PANEL WITH GFR
ALT: 12 U/L (ref 0–44)
AST: 13 U/L — ABNORMAL LOW (ref 15–41)
Albumin: 3.9 g/dL (ref 3.5–5.0)
Alkaline Phosphatase: 93 U/L (ref 38–126)
Anion gap: 8 (ref 5–15)
BUN: 24 mg/dL — ABNORMAL HIGH (ref 6–20)
CO2: 25 mmol/L (ref 22–32)
Calcium: 8.9 mg/dL (ref 8.9–10.3)
Chloride: 106 mmol/L (ref 98–111)
Creatinine, Ser: 0.63 mg/dL (ref 0.44–1.00)
GFR, Estimated: >60 mL/min (ref >60)
Glucose, Bld: 110 mg/dL — ABNORMAL HIGH (ref 70–99)
Potassium: 4.2 mmol/L (ref 3.5–5.1)
Sodium: 139 mmol/L (ref 135–145)
Total Bilirubin: 0.6 mg/dL (ref 0.0–1.2)
Total Protein: 6.9 g/dL (ref 6.5–8.1)

## 2023-12-24 MED ORDER — DICYCLOMINE HCL 10 MG PO CAPS: 10.0000 mg | ORAL_CAPSULE | Freq: Three times a day (TID) | ORAL | 0 refills | Status: AC | PRN

## 2023-12-24 MED ORDER — METOCLOPRAMIDE HCL 10 MG PO TABS: 10.0000 mg | ORAL_TABLET | Freq: Three times a day (TID) | ORAL | 0 refills | Status: AC | PRN

## 2023-12-24 MED ORDER — FAMOTIDINE 20 MG PO TABS: 20.0000 mg | ORAL_TABLET | Freq: Two times a day (BID) | ORAL | 0 refills | Status: DC

## 2023-12-24 MED ORDER — DICYCLOMINE HCL 10 MG PO CAPS
10.0000 mg | ORAL_CAPSULE | Freq: Once | ORAL | Status: AC
  Administered 2023-12-24: 10 mg via ORAL
  Filled 2023-12-24: qty 1

## 2023-12-24 MED ORDER — FAMOTIDINE 20 MG PO TABS
20.0000 mg | ORAL_TABLET | Freq: Once | ORAL | Status: AC
  Administered 2023-12-24: 20 mg via ORAL
  Filled 2023-12-24: qty 1

## 2023-12-24 MED ORDER — METOCLOPRAMIDE HCL 10 MG PO TABS
10.0000 mg | ORAL_TABLET | Freq: Once | ORAL | Status: AC
  Administered 2023-12-24: 10 mg via ORAL
  Filled 2023-12-24: qty 1

## 2023-12-24 MED ORDER — IOHEXOL 300 MG/ML  SOLN
100.0000 mL | Freq: Once | INTRAMUSCULAR | Status: AC | PRN
  Administered 2023-12-24: 100 mL via INTRAVENOUS

## 2023-12-24 MED ORDER — MORPHINE SULFATE (PF) 4 MG/ML IV SOLN
4.0000 mg | Freq: Once | INTRAVENOUS | Status: AC
  Administered 2023-12-24: 4 mg via INTRAVENOUS
  Filled 2023-12-24: qty 1

## 2023-12-24 NOTE — Telephone Encounter (Signed)
 Update--Patient previously triaged today--Patient going to the Emergency Room & wants PCP advice on her pain contract with PCP if she receives pain medication while in the ER or if they prescribe it  FYI Only or Action Required?: Action required by provider: clinical question for provider and update on patient condition.  Patient was last seen in primary care on 12/13/2023 with Michelene Cower PA-C.  Called Nurse Triage reporting Abdominal Pain and Low Oxygen Saturation Reading.  Symptoms began Saturday.  Interventions attempted: Rest, hydration, or home remedies.  Symptoms are: rapidly worsening.  Triage Disposition: Go to ED Now (Notify PCP)  Patient/caregiver understands and will follow disposition?: Yes   Copied from CRM [id number]. Topic: Clinical - Red Word Triage >> Dec 24, 2023  9:42 AM Maisie BROCKS wrote: Red Word that prompted transfer to Nurse Triage: oxygen saturation has been dropping to 89. It has been staying at 89 for about 3 minutes and then back up to 92. Reason for Disposition  Oxygen level (e.g., pulse oximetry) 90% or lower  Answer Assessment - Initial Assessment Questions Patient called back and states that she forgot to tell the triage nurse earlier that her oxygen saturation has been lower 89%  Highest it would get is at 93% Patient denies any difficulty breathing however  On Tussinex for chronic cough Pain fluctuates and goes to a pain level goes to a 10. She states that the bouts of pain Patient is advised that it is recommended that she goes to the Emergency Room if she is having severe abdominal pain and also with her Oxygen  saturation at 89% Patient denies any shortness of breath at this time Patient is advised that it is recommended to have someone else drive her to the Emergency Room and she states that she doesn't live that far from the Emergency Room but she has someone she will try to call to take her She is advised that if anything gets worse to call  911 She verbalized understanding    Patient states that she has a pain contract with her PCP and her biggest concern is that she doesn't want to break that pain contract if she goes to the Emergency Room and receives pain medication Patient states that she is going to the Emergency Room but she wants to know from her PCP what to do if they prescribe pain medication and would it break her contract with her PCP if they gave her pain medication while in the Emergency Room.  Protocols used: Oxygen Monitoring and Hypoxia-A-AH

## 2023-12-24 NOTE — ED Provider Notes (Signed)
 [facility] Provider Note    Event Date/Time   First MD Initiated Contact with Patient 12/24/23 1123     (approximate)   History   Abdominal Pain   HPI  Jenna Tyler is a 58 y.o. female with a history of hypertension, COPD, OSA, seizures, recurrent aspiration pneumonia, fibromyalgia, and opioid and alcohol abuse in remission who presents with left upper quadrant abdominal pain, gradual onset since yesterday, initially more between her shoulder blades and in her back, but now in the left upper abdomen.  She reports associated nausea but no vomiting.  The pain is varying in intensity, somewhat similar to labor pains.  However, she does not have any lower abdominal pain.  She denies any diarrhea or urinary symptoms.  She has no fever.  She states that she had similar pain once before and was told that she had a fatty pancreas or possible chronic pancreatitis.  I reviewed the past medical records.  The patient's most recent prior outpatient visit was on 8/22 with internal medicine for evaluation of urinary frequency and back pain.  UA was negative.   Physical Exam   Triage Vital Signs: ED Triage Vitals  Encounter Vitals Group     BP 12/24/23 1034 135/89     Girls Systolic BP Percentile --      Girls Diastolic BP Percentile --      Boys Systolic BP Percentile --      Boys Diastolic BP Percentile --      Pulse Rate 12/24/23 1034 69     Resp 12/24/23 1034 18     Temp 12/24/23 1034 98.6 F (37 C)     Temp src --      SpO2 12/24/23 1034 100 %     Weight 12/24/23 1035 175 lb (79.4 kg)     Height 12/24/23 1035 5' 4 (1.626 m)     Head Circumference --      Peak Flow --      Pain Score 12/24/23 1035 8     Pain Loc --      Pain Education --      Exclude from Growth Chart --     Most recent vital signs: Vitals:   12/24/23 1034  BP: 135/89  Pulse: 69  Resp: 18  Temp: 98.6 F (37 C)  SpO2: 100%     General: Awake, no distress.  CV:  Good  peripheral perfusion.  Resp:  Normal effort.  Abd:  No distention.  Left upper quadrant tenderness. Other:  No jaundice or scleral icterus.   ED Results / Procedures / Treatments   Labs (all labs ordered are listed, but only abnormal results are displayed) Labs Reviewed  COMPREHENSIVE METABOLIC PANEL WITH GFR - Abnormal; Notable for the following components:      Result Value   Glucose, Bld 110 (*)    BUN 24 (*)    AST 13 (*)    All other components within normal limits  CBC - Abnormal; Notable for the following components:   RBC 5.22 (*)    All other components within normal limits  URINALYSIS, ROUTINE W REFLEX MICROSCOPIC - Abnormal; Notable for the following components:   Color, Urine YELLOW (*)    APPearance CLEAR (*)    Specific Gravity, Urine 1.033 (*)    Leukocytes,Ua SMALL (*)    All other components within normal limits  LIPASE, BLOOD     EKG  ED ECG REPORT I, Waylon Cassis, the attending  physician, personally viewed and interpreted this ECG.  Date: 12/24/2023 EKG Time: 1227 Rate: 58 Rhythm: normal sinus rhythm QRS Axis: normal Intervals: normal ST/T Wave abnormalities: Nonspecific T wave abnormality Narrative Interpretation: no evidence of acute ischemia    RADIOLOGY  CT abdomen/pelvis: I independently viewed and interpreted the images; there are no dilated bowel loops or any free air or free fluid.  Radiology report indicates no acute abnormality.  PROCEDURES:   Critical Care performed: No  Procedures   MEDICATIONS ORDERED IN ED: Medications  famotidine  (PEPCID ) tablet 20 mg (20 mg Oral Given 12/24/23 1216)  metoCLOPramide  (REGLAN ) tablet 10 mg (10 mg Oral Given 12/24/23 1216)  dicyclomine  (BENTYL ) capsule 10 mg (10 mg Oral Given 12/24/23 1216)  iohexol  (OMNIPAQUE ) 300 MG/ML solution 100 mL (100 mLs Intravenous Contrast Given 12/24/23 1300)  morphine  (PF) 4 MG/ML injection 4 mg (4 mg Intravenous Given 12/24/23 1325)     IMPRESSION / MDM /  ASSESSMENT AND PLAN / ED COURSE  I reviewed the triage vital signs and the nursing notes.  58 year old female with PMH as noted above presents with left upper quadrant/epigastric abdominal pain with nausea since yesterday.  On exam the patient is overall relatively well-appearing.  Vital signs are normal.  She has some tenderness in the left upper quadrant.  Differential diagnosis includes, but is not limited to, gastritis, gastroparesis, PUD, gastroenteritis, colitis, diverticulitis, pancreatitis, less likely other hepatobiliary cause.  We will give Reglan , Pepcid , and Bentyl  for symptomatic treatment, obtain lab workup, a CT, and reassess.  Patient's presentation is most consistent with acute complicated illness / injury requiring diagnostic workup.  ----------------------------------------- 2:38 PM on 12/24/2023 -----------------------------------------  Lab workup is reassuring.  CMP and CBC show no acute findings.  Lipase is normal.  Urinalysis is negative.  CT also shows no acute abnormality.  Overall I suspect gastritis or possibly mild gastroenteritis.  The patient's pain is resolved.  She feels much better and would like to go home.  She is tolerating p.o.  I gave strict return precautions, and she expressed understanding.   FINAL CLINICAL IMPRESSION(S) / ED DIAGNOSES   Final diagnoses:  Upper abdominal pain     Rx / DC Orders   ED Discharge Orders          Ordered    famotidine  (PEPCID ) 20 MG tablet  2 times daily        12/24/23 1435    metoCLOPramide  (REGLAN ) 10 MG tablet  Every 8 hours PRN        12/24/23 1435    dicyclomine  (BENTYL ) 10 MG capsule  Every 8 hours PRN        12/24/23 1435             Note:  This document was prepared using Dragon voice recognition software and may include unintentional dictation errors.    Jacolyn Pae, MD 12/24/23 [phone]

## 2023-12-24 NOTE — Telephone Encounter (Signed)
 Copied from CRM #[id number]. Topic: Clinical - Red Word Triage >> Dec 24, 2023  8:20 AM Jenna Tyler wrote: Red Word that prompted transfer to Nurse Triage: pain  Pt stated that she is experiencing upper left quadrant pain and would like to schedule an appt with provider.    ----------------------------------------------------------------------- From previous Reason for Contact - Scheduling: Patient/patient representative is calling to schedule an appointment. Refer to attachments for appointment information. Reason for Disposition  [1] MODERATE pain (e.g., interferes with normal activities) AND [2] pain comes and goes (cramps) AND [3] present > 24 hours  (Exception: Pain with Vomiting or Diarrhea - see that Guideline.)  Answer Assessment - Initial Assessment Questions 1. LOCATION: Where does it hurt?      I'm having pain in upper left abd.  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    I have chronic pancreatitis.    It started Sat. At lunch time.   I was having pain between my shoulder blades and yesterday started in upper abd on left side.   It stays at 4/10 and sometimes 10/10 on pain scale.     I'm under a pain contract with [employer] with Leisa Tapia, PA-C.     2. RADIATION: Does the pain shoot anywhere else? (e.g., chest, back)     No radiation now 3. ONSET: When did the pain begin? (e.g., minutes, hours or days ago)      Sat. 4. SUDDEN: Gradual or sudden onset?     Suddenly started 5. PATTERN Does the pain come and go, or is it constant?     Constant but worse than others 6. SEVERITY: How bad is the pain?  (e.g., Scale 1-10; mild, moderate, or severe)     4/10-10/10 7. RECURRENT SYMPTOM: Have you ever had this type of stomach pain before? If Yes, ask: When was the last time? and What happened that time?      I was told I have fatty pancreatitis.    8. CAUSE: What do you think is causing the stomach pain? (e.g., gallstones, recent abdominal surgery)     May be pancreatitis 9.  RELIEVING/AGGRAVATING FACTORS: What makes it better or worse? (e.g., antacids, bending or twisting motion, bowel movement)     Nothing 10. OTHER SYMPTOMS: Do you have any other symptoms? (e.g., back pain, diarrhea, fever, urination pain, vomiting)       Some nausea 11. PREGNANCY: Is there any chance you are pregnant? When was your last menstrual period?       N/A due to age  Protocols used: Abdominal Pain - Female-A-AH Pt has a contract with Michelene Cower, PA_C for [employer].   Would really rather see Leisa.   Is it possible she can be worked in today with any provider?       FYI Only or Action Required?: Action required by provider: request for appointment.  Patient was last seen in primary care on 12/13/2023 by Michelene Cower, PA-C  Called Nurse Triage reporting Abdominal Pain. Left upper abd pain started Sat.   History of fatty pancreas/pancreatitis.   Under pain contract with Leisa so does not want to go to ED or urgent care.   Willing to see any provider that can possibly work her in today. Symptoms began several days ago.  Interventions attempted: Prescription medications: [employer].  Symptoms are: gradually worsening.  Triage Disposition: See Physician Within 24 Hours  Is it possible she can be worked in today with any provider preferably Leisa?  Patient/caregiver understands and will follow disposition?:  Yes

## 2023-12-24 NOTE — Telephone Encounter (Signed)
 Called w/no answer. Patient currently in ED

## 2023-12-24 NOTE — ED Triage Notes (Addendum)
 Pt comes with left upper side pain. Pt states this started yesterday. Pt states hx of pancreatitis. Pt states nausea but no vomiting. Pt states has just intensified. Pt denies any urinary symptoms.

## 2023-12-24 NOTE — Discharge Instructions (Addendum)
 We suspect that you likely have some gastritis (inflammation in your stomach lining), versus a possible mild intestinal infection.  Take the prescribed medications as needed.  Follow-up with your primary care provider.  Return to the ER for new, worsening, or persistent severe pain, vomiting, inability hold anything down, weakness or lightheadedness, high fever, or any other new or worsening symptoms that concern you.

## 2023-12-24 NOTE — ED Notes (Signed)
 See triage note  Presents wit upper abd pain with nausea  States pain radiates up into shoulder

## 2023-12-25 NOTE — Telephone Encounter (Signed)
 Pt went to ER
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
## 2023-12-29 ENCOUNTER — Other Ambulatory Visit: Payer: Self-pay | Admitting: Family Medicine

## 2023-12-30 ENCOUNTER — Encounter: Payer: Self-pay | Admitting: Family Medicine

## 2023-12-30 ENCOUNTER — Other Ambulatory Visit: Payer: Self-pay

## 2023-12-30 MED ORDER — ROPINIROLE HCL 1 MG PO TABS: 1.0000 mg | ORAL_TABLET | Freq: Every evening | ORAL | 2 refills | Status: DC | PRN
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
## 2024-01-01 ENCOUNTER — Encounter: Payer: Self-pay | Admitting: Family Medicine

## 2024-01-03 ENCOUNTER — Ambulatory Visit: Payer: Self-pay | Admitting: *Deleted

## 2024-01-03 NOTE — Telephone Encounter (Signed)
 FYI Only or Action Required?: Action required by provider: clinical question for provider, update on patient condition, and requesting call back today due to appt already scheduled for hosp f/u on 01/06/24.  Patient was last seen in primary care on 12/13/2023 by Tapia, Leisa, PA-C.  Called Nurse Triage reporting Covid Exposure.  Symptoms began none, exposure to covid Wednesday and today .  Interventions attempted: Nothing.  Symptoms are: no sx does have hx lung issues.  Triage Disposition: Call PCP Within 24 Hours please call today if possible regarding appt Monday   Patient/caregiver understands and will follow disposition?: Yes              Copied from CRM #[id number]. Topic: Clinical - Medical Advice >> Jan 03, 2024 12:05 PM Jenna Tyler wrote: Reason for CRM: patient calling daughter tested positive for Covid on 01/03/24 and granddaughter showing symptoms who spent the night with patient, patient is asking should she be concernd about getting Covid. Patinet has a lot of lung issues, and would like to speak with a nurse. Reason for Disposition  [1] COVID-19 EXPOSURE within last 14 days AND [2] weak immune system (e.g., HIV positive, cancer chemo, splenectomy, organ transplant, chronic steroids) AND [3] NO symptoms  Answer Assessment - Initial Assessment Questions Please advise when patient should be tested for covid. Recommended at home test Saturday if sx do not occur before. Please advise. Patient already scheduled for appt with PCP on 01/06/24. Please advise if patient should come in for appt. Instructed patient if sx occur or test positive for covid call and cancel appt . Patient requesting call back.      1. COVID-19 EXPOSURE: Please describe how you were exposed to someone with a COVID-19 infection.     Daughter tested positive today and granddaughter stayed  2. PLACE of CONTACT: Where were you when you were exposed to COVID-19? (e.g., home, school, medical waiting room;  which city?)     Home  3. TYPE of CONTACT: How much contact was there? (e.g., sitting next to, live in same house, work in same office, same building)     Same house 4. DURATION of CONTACT: How long were you in contact with the COVID-19 patient? (e.g., a few seconds, passed by person, a few minutes, 15 minutes or longer, live with the patient)     15 minutes or longer  5. MASK: Were you wearing a mask? Was the other person wearing a mask? Note: wearing a mask reduces the risk of an otherwise close contact.     na 6. DATE of CONTACT: When did you have contact with a COVID-19 patient? (e.g., how many days ago)     Wednesday night, and today  7. COMMUNITY SPREAD: Do you live in or have you traveled to an area where there are lots of COVID-19 cases (community spread)? (See public health department website, if unsure)       No patient daughter works at nursing home and patient was around her and granddaughter 36. SYMPTOMS: Do you have any symptoms? (e.g., fever, cough, breathing difficulty, loss of taste or smell)     Chronic cough  9. VACCINE: Have you gotten the COVID-19 vaccine? If Yes, ask: Which one, how many shots, when did you get it?     Yes years ago  10. PREGNANCY OR POSTPARTUM: Is there any chance you are pregnant? When was your last menstrual period? Did you deliver in the last 2 weeks?       na  11. HIGH RISK: Do you have any heart or lung problems? (e.g., asthma, COPD, heart failure) Do you have a weak immune system or other risk factors? (e.g., HIV positive, chemotherapy, renal failure, diabetes mellitus, sickle cell anemia, obesity)       Hx lung issues  Protocols used: Coronavirus (COVID-19) Exposure-A-AH

## 2024-01-06 ENCOUNTER — Encounter: Payer: Self-pay | Admitting: Family Medicine

## 2024-01-06 ENCOUNTER — Ambulatory Visit (INDEPENDENT_AMBULATORY_CARE_PROVIDER_SITE_OTHER): Admitting: Family Medicine

## 2024-01-06 VITALS — BP 128/78 | HR 89 | Resp 16 | Ht 64.0 in | Wt 175.0 lb

## 2024-01-06 DIAGNOSIS — N3946 Mixed incontinence: Secondary | ICD-10-CM

## 2024-01-06 DIAGNOSIS — K219 Gastro-esophageal reflux disease without esophagitis: Secondary | ICD-10-CM

## 2024-01-06 DIAGNOSIS — R109 Unspecified abdominal pain: Secondary | ICD-10-CM | POA: Diagnosis not present

## 2024-01-06 DIAGNOSIS — D214 Benign neoplasm of connective and other soft tissue of abdomen: Secondary | ICD-10-CM

## 2024-01-06 DIAGNOSIS — N816 Rectocele: Secondary | ICD-10-CM

## 2024-01-06 DIAGNOSIS — K269 Duodenal ulcer, unspecified as acute or chronic, without hemorrhage or perforation: Secondary | ICD-10-CM

## 2024-01-06 DIAGNOSIS — Z09 Encounter for follow-up examination after completed treatment for conditions other than malignant neoplasm: Secondary | ICD-10-CM | POA: Diagnosis not present

## 2024-01-06 DIAGNOSIS — K224 Dyskinesia of esophagus: Secondary | ICD-10-CM

## 2024-01-06 NOTE — Assessment & Plan Note (Signed)
Following with GI. °

## 2024-01-06 NOTE — Assessment & Plan Note (Signed)
 Going to urology/urogyn tomorrow, worse sx

## 2024-01-06 NOTE — Progress Notes (Signed)
 Patient ID: Jenna Tyler, female    DOB: 03-05-1966, 58 y.o.   MRN: [id number]  PCP: Leavy Mole, PA-C  Chief Complaint  Patient presents with   Hospitalization Follow-up    Subjective:   Jenna Tyler is a 58 y.o. female, presents to clinic with CC of the following:  HPI  Hospital/ ER follow up Pt went to ED on 9/2 for abd pain  IMPRESSION / MDM / ASSESSMENT AND PLAN / ED COURSE  I reviewed the triage vital signs and the nursing notes.   58 year old female with PMH as noted above presents with left upper quadrant/epigastric abdominal pain with nausea since yesterday.  On exam the patient is overall relatively well-appearing.  Vital signs are normal.  She has some tenderness in the left upper quadrant.   Differential diagnosis includes, but is not limited to, gastritis, gastroparesis, PUD, gastroenteritis, colitis, diverticulitis, pancreatitis, less likely other hepatobiliary cause.  We will give Reglan , Pepcid , and Bentyl  for symptomatic treatment, obtain lab workup, a CT, and reassess.   Patient's presentation is most consistent with acute complicated illness / injury requiring diagnostic workup.   ----------------------------------------- 2:38 PM on 12/24/2023 -----------------------------------------   Lab workup is reassuring.  CMP and CBC show no acute findings.  Lipase is normal.  Urinalysis is negative.  CT also shows no acute abnormality.  Overall I suspect gastritis or possibly mild gastroenteritis.  The patient's pain is resolved.  She feels much better and would like to go home.  She is tolerating p.o.  I gave strict return precautions, and she expressed understanding.  CT ABDOMEN PELVIS W CONTRAST CLINICAL DATA:  Acute left upper quadrant abdominal pain.  EXAM: CT ABDOMEN AND PELVIS WITH CONTRAST  TECHNIQUE: Multidetector CT imaging of the abdomen and pelvis was performed using the standard protocol following bolus administration of intravenous  contrast.  RADIATION DOSE REDUCTION: This exam was performed according to the departmental dose-optimization program which includes automated exposure control, adjustment of the mA and/or kV according to patient size and/or use of iterative reconstruction technique.  CONTRAST:  OMNIPAQUE  IOHEXOL  300 MG/ML  SOLN  COMPARISON:  May 22, 2023.  June 03, 2023.  FINDINGS: Lower chest: Minimal bilateral posterior basilar subsegmental atelectasis or scarring is noted.  Hepatobiliary: Stable right hepatic hemangioma. Status post cholecystectomy. No biliary dilatation.  Pancreas: Unremarkable. No pancreatic ductal dilatation or surrounding inflammatory changes.  Spleen: Normal in size without focal abnormality.  Adrenals/Urinary Tract: Adrenal glands are unremarkable. Kidneys are normal, without renal calculi, focal lesion, or hydronephrosis. Bladder is unremarkable.  Stomach/Bowel: Stomach is unremarkable. Status post appendectomy. No evidence of bowel obstruction or inflammation.  Vascular/Lymphatic: No significant vascular findings are present. No enlarged abdominal or pelvic lymph nodes.  Reproductive: Status post hysterectomy. No adnexal masses.  Other: No abdominal wall hernia or abnormality. No abdominopelvic ascites.  Musculoskeletal: No acute or significant osseous findings.  IMPRESSION: 1. Stable right hepatic hemangioma. 2. No acute abnormality seen in the abdomen or pelvis.  Electronically Signed   By: Lynwood Landy Raddle M.D.   On: 12/24/2023 14:00   Results for orders placed or performed during the hospital encounter of 12/24/23  Lipase, blood   Collection Time: 12/24/23 10:35 AM  Result Value Ref Range   Lipase 51 11 - 51 U/L  Comprehensive metabolic panel   Collection Time: 12/24/23 10:35 AM  Result Value Ref Range   Sodium 139 135 - 145 mmol/L   Potassium 4.2 3.5 - 5.1 mmol/L   Chloride 106  98 - 111 mmol/L   CO2 25 22 - 32 mmol/L   Glucose,  Bld 110 (H) 70 - 99 mg/dL   BUN 24 (H) 6 - 20 mg/dL   Creatinine, Ser 9.36 0.44 - 1.00 mg/dL   Calcium 8.9 8.9 - 89.6 mg/dL   Total Protein 6.9 6.5 - 8.1 g/dL   Albumin 3.9 3.5 - 5.0 g/dL   AST 13 (L) 15 - 41 U/L   ALT 12 0 - 44 U/L   Alkaline Phosphatase 93 38 - 126 U/L   Total Bilirubin 0.6 0.0 - 1.2 mg/dL   GFR, Estimated >39 >39 mL/min   Anion gap 8 5 - 15  CBC   Collection Time: 12/24/23 10:35 AM  Result Value Ref Range   WBC 8.6 4.0 - 10.5 K/uL   RBC 5.22 (H) 3.87 - 5.11 MIL/uL   Hemoglobin 14.4 12.0 - 15.0 g/dL   HCT 55.5 63.9 - 53.9 %   MCV 85.1 80.0 - 100.0 fL   MCH 27.6 26.0 - 34.0 pg   MCHC 32.4 30.0 - 36.0 g/dL   RDW 86.1 88.4 - 84.4 %   Platelets 271 150 - 400 K/uL   nRBC 0.0 0.0 - 0.2 %  Urinalysis, Routine w reflex microscopic -Urine, Random   Collection Time: 12/24/23 11:25 AM  Result Value Ref Range   Color, Urine YELLOW (A) YELLOW   APPearance CLEAR (A) CLEAR   Specific Gravity, Urine 1.033 (H) 1.005 - 1.030   pH 5.0 5.0 - 8.0   Glucose, UA NEGATIVE NEGATIVE mg/dL   Hgb urine dipstick NEGATIVE NEGATIVE   Bilirubin Urine NEGATIVE NEGATIVE   Ketones, ur NEGATIVE NEGATIVE mg/dL   Protein, ur NEGATIVE NEGATIVE mg/dL   Nitrite NEGATIVE NEGATIVE   Leukocytes,Ua SMALL (A) NEGATIVE   RBC / HPF 0-5 0 - 5 RBC/hpf   WBC, UA 0-5 0 - 5 WBC/hpf   Bacteria, UA NONE SEEN NONE SEEN   Squamous Epithelial / HPF 0-5 0 - 5 /HPF   Mucus PRESENT    Lab Results  Component Value Date   HGBA1C 5.7 (H) 08/13/2023   She had questions about ECG changes - reviewed several ecg's with pt today, axis changes no other findings or concerning changes  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   OSA (obstructive sleep apnea) 01/16/2023   Fibromyalgia  03/31/2021   Headache disorder 12/09/2019   Allergic rhinitis, seasonal 07/14/2019   Urethra, 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   Polycythemia 07/02/2019   Seizures (HCC) 01/15/2018   HTN (hypertension) 12/29/2016   COPD (chronic obstructive pulmonary disease) (HCC) 12/29/2016   Alcohol use disorder in remission 12/27/2016   Multiple duodenal ulcers 01/24/2016   Neurofibromatosis, 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   Vitamin D  deficiency 04/04/2015   Anxiety, generalized 10/12/2014   Insomnia, persistent 10/12/2014   Depression, major, recurrent, moderate (HCC) 10/12/2014   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) 10/12/2014   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   Acquired hypothyroidism 07/07/2014   Postmenopausal atrophic vaginitis 11/21/2009   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   Headache, migraine 04/23/1998   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      Current Outpatient Medications:    albuterol  (PROVENTIL ) (2.5 MG/3ML) 0.083% nebulizer solution, Take 2.5 mg by nebulization every 4 (four) hours  as needed for wheezing or shortness of breath., Disp: , Rfl:    Blood Glucose Monitoring Suppl (GLUCOCOM BLOOD GLUCOSE MONITOR) DEVI, 1 each as directed, Disp: , Rfl:    budesonide  (PULMICORT ) 0.25 MG/2ML nebulizer solution, Take 0.25 mg by nebulization daily as needed., Disp: , Rfl:    chlorpheniramine-HYDROcodone  (TUSSIONEX) 10-8 MG/5ML, Take 5 mLs by mouth every 12 (twelve) hours as needed for cough., Disp: 240 mL, Rfl: 0   dicyclomine  (BENTYL ) 10 MG capsule, Take 1 capsule (10 mg total) by mouth every 8 (eight) hours as needed., Disp: 12 capsule, Rfl: 0   doxepin (SINEQUAN) 25 MG capsule, Take 25 mg by mouth at bedtime., Disp: , Rfl:    famotidine  (PEPCID ) 20 MG tablet, Take 1 tablet (20 mg total) by mouth 2 (two) times daily for 15 days., Disp: 30 tablet, Rfl: 0   gabapentin  (NEURONTIN ) 800 MG tablet, Take 800 mg by mouth 2 (two) times daily., Disp: ,  Rfl:    ibuprofen (ADVIL) 200 MG tablet, Take 200 mg by mouth every 6 (six) hours as needed for headache, mild pain (pain score 1-3) or fever., Disp: , Rfl:    levothyroxine  (SYNTHROID ) 100 MCG tablet, Take 1 tablet (100 mcg total) by mouth daily before breakfast., Disp: 90 tablet, Rfl: 1   methocarbamol  (ROBAXIN ) 750 MG tablet, Take 750 mg by mouth 2 (two) times daily., Disp: , Rfl:    metoCLOPramide  (REGLAN ) 10 MG tablet, Take 1 tablet (10 mg total) by mouth every 8 (eight) hours as needed., Disp: 15 tablet, Rfl: 0   metoprolol  tartrate (LOPRESSOR ) 25 MG tablet, Take 1 tablet (25 mg total) by mouth every morning., Disp: 90 tablet, Rfl: 3   ondansetron  (ZOFRAN -ODT) 4 MG disintegrating tablet, Take 1 tablet (4 mg total) by mouth every 6 (six) hours as needed for nausea or vomiting., Disp: 20 tablet, Rfl: 0   pantoprazole  (PROTONIX ) 40 MG tablet, Take 1 tablet (40 mg total) by mouth 2 (two) times daily., Disp: 180 tablet, Rfl: 0   Respiratory Therapy Supplies (NEBULIZER/TUBING/MOUTHPIECE) KIT, Disp one nebulizer machine, tubing set and mouthpiece kit, Disp: 1 kit, Rfl: 0   rOPINIRole  (REQUIP ) 1 MG tablet, Take 1 tablet (1 mg total) by mouth at bedtime as needed (RLS/insomnia)., Disp: 30 tablet, Rfl: 2   SYMBICORT  160-4.5 MCG/ACT inhaler, Inhale 2 puffs into the lungs 2 (two) times daily., Disp: , Rfl:    Allergies  Allergen Reactions   Nsaids Other (See Comments)    Internal bleeding  Intestinal bleeding   Paxlovid [Nirmatrelvir-Ritonavir]    Tizanidine  Other (See Comments)    Visual hallucinations   Adhesive [Tape] Rash   Aspirin Other (See Comments)    Cannot take due to ulcers.   Penicillins Rash    Has patient had a PCN reaction causing immediate rash, facial/tongue/throat swelling, SOB or lightheadedness with hypotension: Yes Has patient had a PCN reaction causing severe rash involving mucus membranes or skin necrosis: No Has patient had a PCN reaction that required hospitalization:  No Has patient had a PCN reaction occurring within the last 10 years: Yes If all of the above answers are NO, then may proceed with Cephalosporin use.  Patient tolerated ANCEF  administration    Wound Dressing Adhesive Rash    Paper Tape is tolerated     Social History   Tobacco Use   Smoking status: Former    Current packs/day: 0.00    Average packs/day: 1 pack/day for 15.0 years (15.0 ttl pk-yrs)  Types: Cigarettes    Start date: 12/29/1982    Quit date: 12/28/1997    Years since quitting: 26.0   Smokeless tobacco: Never  Vaping Use   Vaping status: Never Used  Substance Use Topics   Alcohol use: Not Currently   Drug use: No      Chart Review Today: I personally reviewed active problem list, medication list, allergies, family history, social history, health maintenance, notes from last encounter, lab results, imaging with the patient/caregiver today.   Review of Systems  Constitutional: Negative.   HENT: Negative.    Eyes: Negative.   Respiratory: Negative.    Cardiovascular: Negative.   Gastrointestinal: Negative.   Endocrine: Negative.   Genitourinary: Negative.   Musculoskeletal: Negative.   Skin: Negative.   Allergic/Immunologic: Negative.   Neurological: Negative.   Hematological: Negative.   Psychiatric/Behavioral: Negative.    All other systems reviewed and are negative.      Objective:   Vitals:   01/06/24 1000  BP: 128/78  Pulse: 89  Resp: 16  SpO2: 97%  Weight: 175 lb (79.4 kg)  Height: 5' 4 (1.626 m)    Body mass index is 30.04 kg/m.  Physical Exam Vitals and nursing note reviewed.  Constitutional:      General: She is not in acute distress.    Appearance: Normal appearance. She is well-developed. She is obese. She is not ill-appearing, toxic-appearing or diaphoretic.  HENT:     Head: Normocephalic and atraumatic.     Right Ear: External ear normal.     Left Ear: External ear normal.     Nose: Nose normal.  Eyes:     General:  No scleral icterus.       Right eye: No discharge.        Left eye: No discharge.     Conjunctiva/sclera: Conjunctivae normal.  Neck:     Trachea: No tracheal deviation.  Cardiovascular:     Rate and Rhythm: Normal rate and regular rhythm.     Pulses: Normal pulses.     Heart sounds: Normal heart sounds.  Pulmonary:     Effort: Pulmonary effort is normal. No respiratory distress.     Breath sounds: No stridor. No rhonchi or rales.     Comments: Intermittent coughing Skin:    General: Skin is warm and dry.     Findings: No rash.  Neurological:     Mental Status: She is alert.     Motor: No abnormal muscle tone.     Coordination: Coordination normal.     Gait: Gait normal.  Psychiatric:        Mood and Affect: Mood normal.        Behavior: Behavior normal.      Results for orders placed or performed during the hospital encounter of 12/24/23  Lipase, blood   Collection Time: 12/24/23 10:35 AM  Result Value Ref Range   Lipase 51 11 - 51 U/L  Comprehensive metabolic panel   Collection Time: 12/24/23 10:35 AM  Result Value Ref Range   Sodium 139 135 - 145 mmol/L   Potassium 4.2 3.5 - 5.1 mmol/L   Chloride 106 98 - 111 mmol/L   CO2 25 22 - 32 mmol/L   Glucose, Bld 110 (H) 70 - 99 mg/dL   BUN 24 (H) 6 - 20 mg/dL   Creatinine, Ser 9.36 0.44 - 1.00 mg/dL   Calcium 8.9 8.9 - 89.6 mg/dL   Total Protein 6.9 6.5 - 8.1 g/dL  Albumin 3.9 3.5 - 5.0 g/dL   AST 13 (L) 15 - 41 U/L   ALT 12 0 - 44 U/L   Alkaline Phosphatase 93 38 - 126 U/L   Total Bilirubin 0.6 0.0 - 1.2 mg/dL   GFR, Estimated >39 >39 mL/min   Anion gap 8 5 - 15  CBC   Collection Time: 12/24/23 10:35 AM  Result Value Ref Range   WBC 8.6 4.0 - 10.5 K/uL   RBC 5.22 (H) 3.87 - 5.11 MIL/uL   Hemoglobin 14.4 12.0 - 15.0 g/dL   HCT 55.5 63.9 - 53.9 %   MCV 85.1 80.0 - 100.0 fL   MCH 27.6 26.0 - 34.0 pg   MCHC 32.4 30.0 - 36.0 g/dL   RDW 86.1 88.4 - 84.4 %   Platelets 271 150 - 400 K/uL   nRBC 0.0 0.0 - 0.2 %   Urinalysis, Routine w reflex microscopic -Urine, Random   Collection Time: 12/24/23 11:25 AM  Result Value Ref Range   Color, Urine YELLOW (A) YELLOW   APPearance CLEAR (A) CLEAR   Specific Gravity, Urine 1.033 (H) 1.005 - 1.030   pH 5.0 5.0 - 8.0   Glucose, UA NEGATIVE NEGATIVE mg/dL   Hgb urine dipstick NEGATIVE NEGATIVE   Bilirubin Urine NEGATIVE NEGATIVE   Ketones, ur NEGATIVE NEGATIVE mg/dL   Protein, ur NEGATIVE NEGATIVE mg/dL   Nitrite NEGATIVE NEGATIVE   Leukocytes,Ua SMALL (A) NEGATIVE   RBC / HPF 0-5 0 - 5 RBC/hpf   WBC, UA 0-5 0 - 5 WBC/hpf   Bacteria, UA NONE SEEN NONE SEEN   Squamous Epithelial / HPF 0-5 0 - 5 /HPF   Mucus PRESENT        Assessment & Plan:      Assessment & Plan    Problem List Items Addressed This Visit       Digestive   Benign gastrointestinal stromal tumor (GIST)   Following with GI      Multiple duodenal ulcers   Continue f/up with GI and PPI        Other   Mixed stress and urge urinary incontinence   Going to urology/urogyn tomorrow, worse sx Mixed urinary incontinence Worsening urinary incontinence. She has an upcoming appointment with a specialist at [facility]. - Follow up with urology specialist at [facility] for further evaluation and management.      Other Visit Diagnoses       Hospital discharge follow-up    -  Primary   see above and HPI thorough review of encounters labs etc Reviewed encounter all lab, urine and imaging results with pt today      Abdominal pain, unspecified abdominal location       resolved sx Experienced severe abdominal pain, initially suspected to be a flare-up of chronic pancreatitis. CT and lab results did not indicate pancreatitis. The patient continues to experience cramping. - No immediate intervention required as symptoms have improved. - Consider use of Bentyl  for cramping if needed.      Gastroesophageal reflux disease, unspecified whether esophagitis present       continue PPI per GI  recommendations      Esophageal dysmotility       pending specialized f/up and testing with tertiary care center Esophageal dysmotility with dysphagia and gastroesophageal reflux disease Continues to experience coughing spells after eating, possibly related to esophageal dysmotility and GERD. Awaiting a special test for esophageal motility at [facility], which has been delayed. - Encourage follow-up with gastroenterologist regarding  the esophageal motility test referral. - Consider use of Reglan  for gut motility and nausea if symptoms persist.      Going to urology /rectocele trouble with BM constipated No abd pain, no N/V Still coughing after eating - per GI   Rectocele [N81.6]  Rectocele with constipation Worsening symptoms with significant difficulty in bowel movements, requiring manual assistance. Severe cramping and hard stools indicate constipation. Stool softeners have not been effective recently. - Encourage use of Miralax  to retain water in the colon and soften stools. - Advise continuation of stool softeners to alleviate constipation.    Recording duration: 11 minutes     Michelene Cower, PA-C 01/06/24 10:11 AM

## 2024-01-06 NOTE — Assessment & Plan Note (Signed)
 Continue f/up with GI and PPI

## 2024-01-08 ENCOUNTER — Encounter: Payer: Self-pay | Admitting: Family Medicine

## 2024-01-13 ENCOUNTER — Other Ambulatory Visit: Payer: Self-pay | Admitting: Family Medicine

## 2024-01-13 DIAGNOSIS — J398 Other specified diseases of upper respiratory tract: Secondary | ICD-10-CM

## 2024-01-13 DIAGNOSIS — R053 Chronic cough: Secondary | ICD-10-CM

## 2024-01-13 NOTE — Telephone Encounter (Signed)
 Copied from CRM [id number]. Topic: Clinical - Medication Refill >> Jan 13, 2024 10:53 AM Sophia H wrote: Medication: chlorpheniramine-HYDROcodone  (TUSSIONEX) 10-8 MG/5ML   Has the patient contacted their pharmacy? Yes, no fills on file.   This is the patient's preferred pharmacy:  [id number] - [place], [place] - [address] AT [facility] [address] [facility] [address] [place] [address] Phone: [phone] Fax: [phone]   Is this the correct pharmacy for this prescription? Yes If no, delete pharmacy and type the correct one.   Has the prescription been filled recently? Yes  Is the patient out of the medication? Yes  Has the patient been seen for an appointment in the last year OR does the patient have an upcoming appointment? Yes, was seen 09/15.  Can we respond through MyChart? Yes  Agent: Please be advised that Rx refills may take up to 3 business days. We ask that you follow-up with your pharmacy.

## 2024-01-14 MED ORDER — HYDROCOD POLI-CHLORPHE POLI ER 10-8 MG/5ML PO SUER: 5.0000 mL | Freq: Two times a day (BID) | ORAL | 0 refills | Status: DC | PRN
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
## 2024-01-22 NOTE — Progress Notes (Signed)
 Urogynecology and Pelvic Reconstructive Surgery Return Patient Visit Referring Provider: Loreli Calton Boron, NP [address] [place],  [place] [address] Primary Care Provider: Patient Care Team: Leavy Mole, [place] as PCP - General (Family Medicine) Jonette Lauraine Norris, PA as Physician Assistant (Neurology) Date of Service: 01/22/2024 Chief Complaint: No chief complaint on file.   History of Present Illness:  Jenna Tyler is a 58 y.o. who presents for a return visit.    Seen by PA Clarene 04/09/23. Not surgical candidate for stage 2 rectocele. To manage with observation. Trial Gemtesa  d/t failure of Mirabegron  and Ditropan  History of Present Illness Jenna Tyler is a 58 year old female who presents with worsened bladder control issues.  She experiences fluctuating bladder control, with some days being worse than others. Bladder control significantly worsens with constipation, leading to almost nonexistent control. She takes Miralax  daily to aid bowel movements, but it is only partially effective.  Bladder control was improved, but started worsening end of August. Now going through several Depends per day. Of note, s/p EGD and biopsies 12/02/23. Path benign  Colletta Sale, CNA  01/22/2024  2:11 PM  Sign when Signing Visit Chaperone present for pelvic exam.    [facility] Urine Culture (last 36 months of results) Recent Labs    01/22/24 1343  URCULT 1,000-10,000 CFU/mL Escherichia coli*    [facility] Urine culture (last 3 results) Lab Results  Component Value Date   [id number] Escherichia coli (!) 06/17/2023   [id number] Comment 11/20/2022   No results found for: [id number]    Specialty Problems       Urogynecology Problems   Muscle spasm      Mixed stress and urge urinary incontinence   Overview Signed 05/28/2023  3:13 PM by Clarene Hunter Massa, PA  03/2023: S/p Mirabegron  and anticholinergic she tried for at least four weeks previously.Recommend trial of Gemtesa .  Start at home exercises, she lives in [place] and therefore declines pelvic floor physical therapy referral for now.          Rectocele       Past Medical History: Patient  has a past medical history of Alcohol abuse, Arthritis (Not sure), Asthma (HHS-HCC), Awareness under anesthesia, Bronchitis, chronic (CMS/HHS-HCC), Chicken pox, COPD (chronic obstructive pulmonary disease) (CMS/HHS-HCC), Diabetes mellitus (CMS/HHS-HCC), GERD (gastroesophageal reflux disease), Headache, unspecified headache type, History of pneumonia, HTN (hypertension) (12/29/2016), Hyperlipidemia, Muscle spasm, Obesity, Pleurisy, Pneumonia, PONV (postoperative nausea and vomiting), Pulmonary nodule, Sleep apnea (2023), Thyroid  disease, and Tracheal/bronchial disease.   Physical Exam: There were no vitals taken for this visit. Constitutional/General Appearance:  well appearing female in no acute distress Abdomen: soft, nontender, nondistended. No masses or hernia noted, no obvious abnormalities of liver or spleen  POP-Q Pelvic Organ Prolapse Assessment  Stage of Prolapse: 1  Aa:  -3 Ba: -3 C: -8  GH 2.5 PB: 4 TVL: 9  Ap: -1.5 Bp: -1.5 D: N/A  CST  - VST  -       Laboratory Results:No results found for this visit on 01/22/24.  Assessment /Plan: Jenna Tyler is a 58 y.o. with:  Assessment & Plan    Assessment & Plan Urge urinary incontinence We discussed the signs and symptoms of urge urinary incontinence being associated with and overactivity of the bladder, which decreases the ability of the bladder to store urine consistently.  This may be worsened by conditions such as diabetes, obesity, sleep apnea and medications such as diuretics.   Given her moderate bother from  this condition, we discussed treatment options such as conservative measures of timed voiding, avoidance of bladder irritants, avoiding excessive fluid intake and pelvic floor exercises. We discussed the option of biofeedback with trained  physical therapists as well as pharmacologic medication options.  We briefly reviewed that if she fails medical therapy, there are 3rd tier options such as posterior tibial nerve stimulation, intravesical botulinum injections, and sacral neuromodulation options available.  Sent in UA/UC earlier today to rule out infectious etiology for worsening of her symptoms Referring to pelvic floor physical therapy   Constipation, unspecified constipation type Chronic constipation contributing to urinary incontinence, managed with daily Miralax  with variable effectiveness.  Add in stool softeners.   Orders Placed This Encounter  Procedures  . Ambulatory Referral to Physical Therapy   Requested Prescriptions    No prescriptions requested or ordered in this encounter    This note has been created using automated tools and reviewed for accuracy by CASSANDRA GERWIG SHAW.  Attestation: I spent a total of 30 minutes in both face-to-face and non-face-to-face activities, excluding procedures performed, for this visit on the date of this encounter.    Attestation Statement:   I personally performed the service, non-incident to. (WP)   CASSANDRA BONNITA GENTRY, NP

## 2024-01-27 ENCOUNTER — Encounter: Payer: Self-pay | Admitting: Family Medicine

## 2024-01-27 ENCOUNTER — Ambulatory Visit: Payer: Self-pay

## 2024-01-27 NOTE — Telephone Encounter (Signed)
 FYI Only or Action Required?: FYI only for provider.  Patient was last seen in primary care on 01/06/2024 by Leavy Mole, PA-C.  Called Nurse Triage reporting Dizziness.  Symptoms began today.  Interventions attempted: Nothing.  Symptoms are: stable.  Triage Disposition: See Physician Within 24 Hours  Patient/caregiver understands and will follow disposition?: No, refuses disposition    Copied from CRM [id number]. Topic: Clinical - Red Word Triage >> Jan 27, 2024 11:59 AM Shanda MATSU wrote: Red Word that prompted transfer to Nurse Triage: Patient has been experiencing dizziness today, is wondering if UTI she was diagnosed with last week could be causing this  103/66, 99/66 HR around 70 Reason for Disposition  [1] MODERATE dizziness (e.g., interferes with normal activities) AND [2] has NOT been evaluated by doctor (or NP/PA) for this  (Exception: Dizziness caused by heat exposure, sudden standing, or poor fluid intake.)  Answer Assessment - Initial Assessment Questions 1. DESCRIPTION: Describe your dizziness.     Headedness and dizziness 2. LIGHTHEADED: Do you feel lightheaded? (e.g., somewhat faint, woozy, weak upon standing)     no 3. VERTIGO: Do you feel like either you or the room is spinning or tilting? (i.e., vertigo)     no 4. SEVERITY: How bad is it?  Do you feel like you are going to faint? Can you stand and walk?     mild 5. ONSET:  When did the dizziness begin?     today 6. AGGRAVATING FACTORS: Does anything make it worse? (e.g., standing, change in head position)     np 7. HEART RATE: Can you tell me your heart rate? How many beats in 15 seconds?  (Note: Not all patients can do this.)       na 8. CAUSE: What do you think is causing the dizziness? (e.g., decreased fluids or food, diarrhea, emotional distress, heat exposure, new medicine, sudden standing, vomiting; unknown)      9. RECURRENT SYMPTOM: Have you had dizziness before? If Yes, ask:  When was the last time? What happened that time?     no 10. OTHER SYMPTOMS: Do you have any other symptoms? (e.g., fever, chest pain, vomiting, diarrhea, bleeding)       BP 103/66, 99/66 11. PREGNANCY: Is there any chance you are pregnant? When was your last menstrual period?      Na Offered to schedule appt: pt refused stating has one on Wednesday.  Nurse informed pt to increase water intake, check BP daily and if s/s worse call back.  Protocols used: Dizziness - Lightheadedness-A-AH

## 2024-01-29 ENCOUNTER — Encounter: Payer: Self-pay | Admitting: Family Medicine

## 2024-01-29 ENCOUNTER — Ambulatory Visit (INDEPENDENT_AMBULATORY_CARE_PROVIDER_SITE_OTHER): Admitting: Family Medicine

## 2024-01-29 VITALS — BP 132/78 | HR 102 | Temp 98.0°F | Resp 18 | Ht 64.0 in | Wt 182.7 lb

## 2024-01-29 DIAGNOSIS — R0789 Other chest pain: Secondary | ICD-10-CM

## 2024-01-29 DIAGNOSIS — Z23 Encounter for immunization: Secondary | ICD-10-CM | POA: Diagnosis not present

## 2024-01-29 NOTE — Progress Notes (Signed)
 Name: Jenna Tyler   MRN: [id number]    DOB: 10-19-1965   Date:01/29/2024       Progress Note  Subjective  Chief Complaint  Chief Complaint  Patient presents with   Rib Injury    States rib pops out and has to push back in ongoing for years, but happening more frequent   Discussed the use of AI scribe software for clinical note transcription with the patient, who gave verbal consent to proceed.  History of Present Illness Jenna Tyler is a 58 year old female with pectus excavatum who presents with left lower rib pain.  She experiences pain in her left lower ribs, describing it as a sensation of the ribs 'popping out' during activities such as sitting up from bed, turning, or engaging in physical activity. The pain also occurs when sitting still but is more frequent with movement. She can visibly see the ribs protruding and manually pushes them back, which is painful. The episodes are becoming more frequent, and it takes longer for her to reposition the ribs.  She has a history of pectus excavatum and suspects her symptoms may be related to a floating rib. There have been no recent injuries to the area. She had recent CT scans and CXR that does not show any rib abnormalities   She also has chronic lung issues, including a chronic cough due to dysmotility, for which she is taking Tussionex .  She is concerned about the availability of this medication in the future as her current provider will not be available to refill it.    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   OSA (obstructive sleep apnea) 01/16/2023   Fibromyalgia 03/31/2021   Headache disorder 12/09/2019   Allergic rhinitis, seasonal 07/14/2019   Urethra, 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   Polycythemia  07/02/2019   Seizures (HCC) 01/15/2018   HTN (hypertension) 12/29/2016   COPD (chronic obstructive pulmonary disease) (HCC) 12/29/2016   Alcohol use disorder in remission 12/27/2016   Multiple duodenal ulcers 01/24/2016   Neurofibromatosis, 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   Vitamin D  deficiency 04/04/2015   Anxiety, generalized 10/12/2014   Insomnia, persistent 10/12/2014   Depression, major, recurrent, moderate (HCC) 10/12/2014   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) 10/12/2014   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   Acquired hypothyroidism 07/07/2014   Postmenopausal atrophic vaginitis 11/21/2009   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   Headache, migraine 04/23/1998   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    Past Surgical History:  Procedure Laterality Date   ABDOMINAL HYSTERECTOMY     APPENDECTOMY  2023   BREAST BIOPSY Right 2012   negative   BRONCHIAL DILITATION  07/17/2023   BRONCHIAL WASHINGS N/A 09/28/2022   Procedure: BRONCHIAL WASHINGS;  Surgeon: Parris Manna, MD;  Location: [facility] ORS;  Service: Thoracic;  Laterality: N/A;   CESAREAN SECTION  2020   CHOLECYSTECTOMY     COLON SURGERY     ESOPHAGOGASTRODUODENOSCOPY N/A 12/02/2023   Procedure: EGD (ESOPHAGOGASTRODUODENOSCOPY);  Surgeon: Onita Elspeth Sharper, DO;  Location: [facility] ENDOSCOPY;  Service: Gastroenterology;  Laterality: N/A;   FLEXIBLE BRONCHOSCOPY N/A 09/28/2022   Procedure: FLEXIBLE BRONCHOSCOPY;  Surgeon: Parris Manna, MD;  Location: [facility] ORS;  Service: Thoracic;  Laterality: N/A;   FOOT SURGERY     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   OTHER SURGICAL HISTORY     excision of lipoma   OTHER SURGICAL HISTORY     Abdominal surgery   SMALL INTESTINE SURGERY  2026   THYROID  SURGERY     thyroidectomy   TUBAL LIGATION     XI ROBOTIC LAPAROSCOPIC ASSISTED APPENDECTOMY N/A 04/08/2020   Procedure: XI ROBOTIC LAPAROSCOPIC ASSISTED APPENDECTOMY;  Surgeon: Tye Millet, DO;  Location: [facility] ORS;  Service: General;  Laterality: N/A;    Family History  Problem Relation Age of Onset   Breast cancer Sister 87   Breast cancer Maternal Aunt 40   Breast cancer Maternal Grandmother 60   Breast cancer Maternal Aunt 40   Breast cancer Cousin 40       maternal side   Heart disease Father     Social History   Tobacco Use   Smoking status: Former    Current packs/day: 0.00    Average packs/day: 1 pack/day for 15.0 years (15.0 ttl pk-yrs)    Types: Cigarettes    Start date: 12/29/1982    Quit date: 12/28/1997    Years since quitting: 26.1   Smokeless tobacco: Never  Substance Use Topics   Alcohol use: Not Currently     Current Outpatient Medications:    albuterol  (PROVENTIL ) (2.5 MG/3ML) 0.083% nebulizer solution, Take 2.5 mg by nebulization every 4 (four) hours as needed for wheezing or shortness of breath., Disp: , Rfl:    Blood Glucose Monitoring Suppl (GLUCOCOM BLOOD GLUCOSE MONITOR) DEVI, 1 each as directed, Disp: , Rfl:    chlorpheniramine-HYDROcodone  (TUSSIONEX) 10-8 MG/5ML, Take 5 mLs by mouth every 12 (twelve) hours as needed for up to 24 days for cough., Disp: 240 mL, Rfl: 0   dicyclomine  (BENTYL ) 10 MG capsule, Take 1 capsule (10 mg total) by mouth every 8 (eight) hours as needed., Disp: 12 capsule, Rfl: 0   doxepin (SINEQUAN) 25 MG capsule, Take 25 mg by mouth at bedtime., Disp: , Rfl:    famotidine  (PEPCID ) 20 MG tablet, Take 1 tablet (20 mg total) by mouth 2 (two) times daily for 15 days., Disp: 30 tablet, Rfl: 0   gabapentin  (NEURONTIN ) 800 MG tablet, Take 800 mg by mouth 2 (two) times daily., Disp: , Rfl:    ibuprofen (ADVIL) 200 MG tablet, Take 200 mg by mouth every 6 (six) hours as needed for headache, mild pain (pain score 1-3) or fever., Disp: , Rfl:    levothyroxine  (SYNTHROID ) 100 MCG tablet, Take 1 tablet (100 mcg total) by mouth daily before breakfast., Disp: 90 tablet, Rfl: 1   methocarbamol  (ROBAXIN ) 750 MG tablet, Take 750 mg  by mouth 2 (two) times daily., Disp: , Rfl:    metoCLOPramide  (REGLAN ) 10 MG tablet, Take 1 tablet (10 mg total) by mouth every 8 (eight) hours as needed., Disp: 15 tablet, Rfl: 0   metoprolol  tartrate (LOPRESSOR ) 25 MG tablet, Take 1 tablet (25 mg total) by mouth every morning., Disp: 90 tablet, Rfl: 3   ondansetron  (ZOFRAN -ODT) 4 MG disintegrating tablet, Take 1 tablet (4 mg total) by mouth every 6 (six) hours as needed for nausea or vomiting., Disp: 20 tablet, Rfl: 0   pantoprazole  (PROTONIX ) 40 MG tablet, Take 1 tablet (40 mg total) by mouth 2 (two) times daily., Disp: 180 tablet, Rfl: 0   Respiratory Therapy Supplies (NEBULIZER/TUBING/MOUTHPIECE) KIT, Disp one nebulizer machine, tubing set and mouthpiece kit, Disp: 1 kit, Rfl: 0   rOPINIRole  (REQUIP ) 1 MG tablet, Take 1 tablet (1 mg total) by  mouth at bedtime as needed (RLS/insomnia)., Disp: 30 tablet, Rfl: 2   SYMBICORT  160-4.5 MCG/ACT inhaler, Inhale 2 puffs into the lungs 2 (two) times daily., Disp: , Rfl:   Allergies  Allergen Reactions   Nsaids Other (See Comments)    Internal bleeding  Intestinal bleeding   Paxlovid [Nirmatrelvir-Ritonavir]    Tizanidine  Other (See Comments)    Visual hallucinations   Adhesive [Tape] Rash   Aspirin Other (See Comments)    Cannot take due to ulcers.   Penicillins Rash    Has patient had a PCN reaction causing immediate rash, facial/tongue/throat swelling, SOB or lightheadedness with hypotension: Yes Has patient had a PCN reaction causing severe rash involving mucus membranes or skin necrosis: No Has patient had a PCN reaction that required hospitalization: No Has patient had a PCN reaction occurring within the last 10 years: Yes If all of the above answers are NO, then may proceed with Cephalosporin use.  Patient tolerated ANCEF  administration    Wound Dressing Adhesive Rash    Paper Tape is tolerated    I personally reviewed active problem list, medication list, allergies with the  patient/caregiver today.   ROS  Ten systems reviewed and is negative except as mentioned in HPI    Objective Physical Exam CONSTITUTIONAL: Patient appears well-developed and well-nourished. No distress. HEENT: Head atraumatic, normocephalic, neck supple. CARDIOVASCULAR: Normal rate, regular rhythm and normal heart sounds. No murmur heard. No BLE edema. PULMONARY: Effort normal. Lungs clear to auscultation. No respiratory distress. Left chest wall prominence and tenderness. No chest wall bruising. MUSCULOSKELETAL: Normal gait. Pectus excavatum, pain on left lower anterior rib cage  PSYCHIATRIC: Patient has a normal mood and affect. Behavior is normal. Judgment and thought content normal.  Vitals:   01/29/24 0954  BP: 132/78  Pulse: (!) 102  Resp: 18  Temp: 98 F (36.7 C)  SpO2: 96%  Weight: 182 lb 11.2 oz (82.9 kg)  Height: 5' 4 (1.626 m)    Body mass index is 31.36 kg/m.  Recent Results (from the past 2160 hours)  Surgical pathology     Status: None   Collection Time: 12/02/23 12:00 AM  Result Value Ref Range   SURGICAL PATHOLOGY      SURGICAL PATHOLOGY [facility] [address], [address] [place], [place] [address] Telephone [phone] or [phone] Fax [phone]  REPORT OF SURGICAL PATHOLOGY   Accession #: [id number] Patient Name: ALYLAH, BLAKNEY Visit # : [id number]  MRN: [id number] Physician: Onita Standing DOB/Age [birth year] (Age: 70) Gender: F Collected Date: 12/02/2023 Received Date: 12/02/2023  FINAL DIAGNOSIS       1. Duodenum, Biopsy, lesion, cbx :       - SCANT DUODENAL MUCOSA WITH REACTIVE CHANGES      - NEGATIVE FOR DYSPLASIA OR MALIGNANCY IN THE SUBMITTED FRAGMENTS      - SEE NOTE       2. Stomach, biopsy, random, cbx :       - GASTRIC ANTRAL AND OXYNTIC MUCOSA WITH NO SPECIFIC HISTOPATHOLOGIC CHANGES      - HELICOBACTER PYLORI-LIKE ORGANISMS ARE NOT IDENTIFIED ON ROUTINE H&E STAIN       3. Esophagus, biopsy,  proximal and distal, cbx :       - ESOPHAGEAL SQUAMOUS MUCOSA WITH NO SPECIFIC HISTOPATHOLOGIC CHANGES      - NEGATIVE FOR INCREASED INTRA EPITHELIAL EOSINOPHILS       Diagnosis Note : Additional sampling is suggested if clinically indicated.  ELECTRONIC SIGNATURE : Rebbecca Md, Insurance account manager, International aid/development worker  MICROSCOPIC DESCRIPTION  CASE COMMENTS STAINS USED IN DIAGNOSIS: H&E H&E H&E    CLINICAL HISTORY  SPECIMEN(S) OBTAINED 1. Duodenum, Biopsy, Lesion, Cbx 2. Stomach, biopsy, Random, Cbx 3. Esophagus, biopsy, Proximal And Distal, Cbx  SPECIMEN COMMENTS: 2. For gastritis 3. For EOE SPECIMEN CLINICAL INFORMATION: 1. 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 Received in formalin are tan, soft tissue fragments that are submitted in toto.Number:  three,  Size:  0.2 cm,  1 block 2. Received in formalin are tan, soft tissue fragments that are submitted in toto.Number:  three,  Size:  0.5 cm,  1 block 3. Received in formali n are tan, soft tissue fragments that are submitted in toto.Number:  4,   Size:  0.3 cm,  1 block.mb 12-02-23        Report signed out from the following location(s) [place] [address] [place], [place] [address] CLIA #: [id number]  [facility] [address] [place], [place] [address] CLIA #: [id number]   Glucose, capillary     Status: Abnormal   Collection Time: 12/02/23  1:25 PM  Result Value Ref Range   Glucose-Capillary 104 (H) 70 - 99 mg/dL    Comment: Glucose reference range applies only to samples taken after fasting for at least 8 hours.   Comment 1 IN EPIC   POCT urinalysis dipstick     Status: None   Collection Time: 12/13/23  1:06 PM  Result Value Ref Range   Color, UA Yellow    Clarity, UA Clear    Glucose, UA  Negative Negative   Bilirubin, UA Negative    Ketones, UA Negative    Spec Grav, UA 1.020 1.010 - 1.025   Blood, UA Negative    pH, UA 6.0 5.0 - 8.0   Protein, UA Negative Negative   Urobilinogen, UA 0.2 0.2 or 1.0 E.U./dL   Nitrite, UA Negative    Leukocytes, UA Negative Negative   Appearance Normal    Odor None   Urine Culture     Status: None   Collection Time: 12/13/23  2:29 PM   Specimen: Urine  Result Value Ref Range   MICRO NUMBER: [id number]    SPECIMEN QUALITY: Adequate    Sample Source URINE    STATUS: FINAL    Result:      Less than 10,000 CFU/mL of single Gram negative organism isolated. No further testing will be performed. If clinically indicated, recollection using a method to minimize contamination, with prompt transfer to Urine Culture Transport Tube, is recommended.  Lipase, blood     Status: None   Collection Time: 12/24/23 10:35 AM  Result Value Ref Range   Lipase 51 11 - 51 U/L    Comment: Performed at [facility], [address] [place], [place] [address]  Comprehensive metabolic panel     Status: Abnormal   Collection Time: 12/24/23 10:35 AM  Result Value Ref Range   Sodium 139 135 - 145 mmol/L   Potassium 4.2 3.5 - 5.1 mmol/L   Chloride 106 98 - 111 mmol/L   CO2 25 22 - 32 mmol/L   Glucose, Bld 110 (H) 70 - 99 mg/dL    Comment: Glucose reference range applies only to samples taken after fasting for at least 8 hours.   BUN 24 (H)  6 - 20 mg/dL   Creatinine, Ser 9.36 0.44 - 1.00 mg/dL   Calcium 8.9 8.9 - 89.6 mg/dL   Total Protein 6.9 6.5 - 8.1 g/dL   Albumin 3.9 3.5 - 5.0 g/dL   AST 13 (L) 15 - 41 U/L   ALT 12 0 - 44 U/L   Alkaline Phosphatase 93 38 - 126 U/L   Total Bilirubin 0.6 0.0 - 1.2 mg/dL   GFR, Estimated >39 >39 mL/min    Comment: (NOTE) Calculated using the CKD-EPI Creatinine Equation (2021)    Anion gap 8 5 - 15    Comment: Performed at [facility], [address] [place], [place] [address]  CBC     Status:  Abnormal   Collection Time: 12/24/23 10:35 AM  Result Value Ref Range   WBC 8.6 4.0 - 10.5 K/uL   RBC 5.22 (H) 3.87 - 5.11 MIL/uL   Hemoglobin 14.4 12.0 - 15.0 g/dL   HCT 55.5 63.9 - 53.9 %   MCV 85.1 80.0 - 100.0 fL   MCH 27.6 26.0 - 34.0 pg   MCHC 32.4 30.0 - 36.0 g/dL   RDW 86.1 88.4 - 84.4 %   Platelets 271 150 - 400 K/uL   nRBC 0.0 0.0 - 0.2 %    Comment: Performed at [facility], [address] [place], [place] [address]  Urinalysis, Routine w reflex microscopic -Urine, Random     Status: Abnormal   Collection Time: 12/24/23 11:25 AM  Result Value Ref Range   Color, Urine YELLOW (A) YELLOW   APPearance CLEAR (A) CLEAR   Specific Gravity, Urine 1.033 (H) 1.005 - 1.030   pH 5.0 5.0 - 8.0   Glucose, UA NEGATIVE NEGATIVE mg/dL   Hgb urine dipstick NEGATIVE NEGATIVE   Bilirubin Urine NEGATIVE NEGATIVE   Ketones, ur NEGATIVE NEGATIVE mg/dL   Protein, ur NEGATIVE NEGATIVE mg/dL   Nitrite NEGATIVE NEGATIVE   Leukocytes,Ua SMALL (A) NEGATIVE   RBC / HPF 0-5 0 - 5 RBC/hpf   WBC, UA 0-5 0 - 5 WBC/hpf   Bacteria, UA NONE SEEN NONE SEEN   Squamous Epithelial / HPF 0-5 0 - 5 /HPF   Mucus PRESENT     Comment: Performed at [facility], [address] [place], [place] [address]      PHQ2/9:    09/24/2023    1:07 PM 07/15/2023    4:30 PM 04/02/2023    2:39 PM 07/07/2015    2:53 PM 04/04/2015   11:50 AM  Depression screen PHQ 2/9  Decreased Interest 0 0 0 0 0  Down, Depressed, Hopeless 0 0 0 0 0  PHQ - 2 Score 0 0 0 0 0  Altered sleeping  0 2    Tired, decreased energy  1 1    Change in appetite  0 0    Feeling bad or failure about yourself   0 0    Trouble concentrating  0 1    Moving slowly or fidgety/restless  0 0    Suicidal thoughts  0 0    PHQ-9 Score  1 4    Difficult doing work/chores  Not difficult at all Somewhat difficult      phq 9 is negative  Fall Risk:    09/24/2023    1:07 PM 04/01/2023    9:35 AM 07/07/2015    2:53 PM 04/04/2015    11:50 AM  Fall Risk   Falls in the past year? 0 0  No  No   Number falls in past yr: 0 0    Injury with Fall? 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 Previous imaging normal. Pain worsening. - Refer to chiropractor for evaluation and possible adjustment. - Advise her to check with Medicaid for chiropractic coverage.  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

## 2024-02-06 ENCOUNTER — Encounter: Payer: Self-pay | Admitting: Family Medicine

## 2024-02-07 ENCOUNTER — Other Ambulatory Visit: Payer: Self-pay | Admitting: Family Medicine

## 2024-02-07 MED ORDER — BENZONATATE 100 MG PO CAPS: 100.0000 mg | ORAL_CAPSULE | Freq: Two times a day (BID) | ORAL | 0 refills | Status: AC | PRN

## 2024-02-10 ENCOUNTER — Encounter: Payer: Self-pay | Admitting: Family Medicine

## 2024-02-12 ENCOUNTER — Encounter: Payer: Self-pay | Admitting: Family Medicine

## 2024-02-17 ENCOUNTER — Encounter: Payer: Self-pay | Admitting: Nurse Practitioner

## 2024-02-17 ENCOUNTER — Ambulatory Visit: Admitting: Family Medicine

## 2024-02-17 ENCOUNTER — Ambulatory Visit: Admitting: Nurse Practitioner

## 2024-02-17 VITALS — BP 126/88 | HR 94 | Temp 97.9°F | Resp 18 | Ht 64.0 in | Wt 184.9 lb

## 2024-02-17 DIAGNOSIS — J449 Chronic obstructive pulmonary disease, unspecified: Secondary | ICD-10-CM

## 2024-02-17 DIAGNOSIS — E1165 Type 2 diabetes mellitus with hyperglycemia: Secondary | ICD-10-CM

## 2024-02-17 DIAGNOSIS — G2581 Restless legs syndrome: Secondary | ICD-10-CM | POA: Diagnosis not present

## 2024-02-17 DIAGNOSIS — E039 Hypothyroidism, unspecified: Secondary | ICD-10-CM

## 2024-02-17 DIAGNOSIS — R053 Chronic cough: Secondary | ICD-10-CM | POA: Diagnosis not present

## 2024-02-17 DIAGNOSIS — I10 Essential (primary) hypertension: Secondary | ICD-10-CM | POA: Diagnosis not present

## 2024-02-17 DIAGNOSIS — Q8501 Neurofibromatosis, type 1: Secondary | ICD-10-CM

## 2024-02-17 DIAGNOSIS — E782 Mixed hyperlipidemia: Secondary | ICD-10-CM

## 2024-02-17 DIAGNOSIS — F411 Generalized anxiety disorder: Secondary | ICD-10-CM

## 2024-02-17 DIAGNOSIS — K224 Dyskinesia of esophagus: Secondary | ICD-10-CM | POA: Insufficient documentation

## 2024-02-17 DIAGNOSIS — F3342 Major depressive disorder, recurrent, in full remission: Secondary | ICD-10-CM

## 2024-02-17 MED ORDER — LANCETS MISC: 1.0000 | 0 refills | Status: AC

## 2024-02-17 MED ORDER — LANCET DEVICE MISC: 1.0000 | Freq: Three times a day (TID) | 0 refills | Status: AC

## 2024-02-17 MED ORDER — ROPINIROLE HCL 2 MG PO TABS: 2.0000 mg | ORAL_TABLET | Freq: Every evening | ORAL | 1 refills | Status: AC | PRN

## 2024-02-17 MED ORDER — BLOOD GLUCOSE MONITORING SUPPL DEVI: 1.0000 | Freq: Three times a day (TID) | 0 refills | Status: AC

## 2024-02-17 MED ORDER — BLOOD GLUCOSE TEST VI STRP: 1.0000 | ORAL_STRIP | Freq: Three times a day (TID) | 0 refills | Status: AC

## 2024-02-17 NOTE — Progress Notes (Signed)
 BP 126/88   Pulse 94   Temp 97.9 F (36.6 C)   Resp 18   Ht 5' 4 (1.626 m)   Wt 184 lb 14.4 oz (83.9 kg)   SpO2 97%   BMI 31.74 kg/m    Subjective:    Patient ID: Jenna Tyler, female    DOB: Aug 31, 1965, 58 y.o.   MRN: [id number]  HPI: Jenna Tyler is a 58 y.o. female  Chief Complaint  Patient presents with   Medical Management of Chronic Issues   Discussed the use of AI scribe software for clinical note transcription with the patient, who gave verbal consent to proceed.  History of Present Illness Jenna Tyler is a 58 year old female with esophageal dysmotility who presents with chronic cough.  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 - Experiences discomfort with food retention in the esophagus, especially in social settings, sometimes leading to avoidance of eating  COPD - Uses albuterol  as needed for breathing issues - Discontinued Symbicort  due to improvement in breathing - History of tracheomalacia  Right thumb laceration - Recent laceration to right thumb from knife injury while removing a plastic lid from a sink - Initial treatment with liquid adhesive and splint for several days - Wound has healed well, but persistent numbness remains  Restless legs syndrome - Currently taking Requip  1 mg at bedtime for restless legs syndrome - Worsening symptoms with overuse  Thyroid  dysfunction - Takes levothyroxine  100 mcg daily for thyroid  management - Interested in checking thyroid  levels, as it has been some time since last laboratory evaluation  Dyslipidemia - Concerned about previously elevated cholesterol levels  Diabetes with hyperglycinemia  - Interested in checking A1c, as it has been some time since last laboratory  evaluation Last A1C 5.7 on 08/13/2023 - Lost blood sugar meter and considering obtaining a new one -currently diet controlled CMP     Component Value Date/Time   NA 139 12/24/2023 1035   NA 139 03/28/2014 1622   K 4.2 12/24/2023 1035   K 3.5 03/28/2014 1622   CL 106 12/24/2023 1035   CL 104 03/28/2014 1622   CO2 25 12/24/2023 1035   CO2 24 03/28/2014 1622   GLUCOSE 110 (H) 12/24/2023 1035   GLUCOSE 105 (H) 03/28/2014 1622   BUN 24 (H) 12/24/2023 1035   BUN 15 03/28/2014 1622   CREATININE 0.63 12/24/2023 1035   CREATININE 0.99 06/27/2015 1514   CALCIUM 8.9 12/24/2023 1035   CALCIUM 9.1 03/28/2014 1622   PROT 6.9 12/24/2023 1035   PROT 7.9 03/28/2014 1622   ALBUMIN 3.9 12/24/2023 1035   ALBUMIN 3.7 03/28/2014 1622   AST 13 (L) 12/24/2023 1035   AST 24 03/28/2014 1622   ALT 12 12/24/2023 1035   ALT 88 (H) 03/28/2014 1622   ALKPHOS 93 12/24/2023 1035   ALKPHOS 187 (H) 03/28/2014 1622   BILITOT 0.6 12/24/2023 1035   BILITOT 0.3 03/28/2014 1622   GFRNONAA >60 12/24/2023 1035   GFRNONAA >60 03/28/2014 1622   GFRNONAA >60 04/27/2013 0855    Lab Results  Component Value Date   HGBA1C 5.7 (H) 08/13/2023   HGBA1C 6.2 (H) 10/04/2021   HGBA1C 6.3 (H) 07/10/2019     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    Depression/anxiety -stable     02/17/2024  8:30 AM 09/24/2023    1:07 PM 07/15/2023    4:30 PM  Depression screen PHQ 2/9  Decreased Interest 0 0 0  Down, Depressed, Hopeless 0 0 0  PHQ - 2 Score 0 0 0  Altered sleeping 0  0  Tired, decreased energy 0  1  Change in appetite 0  0  Feeling bad or failure about yourself  0  0  Trouble concentrating 0  0  Moving slowly or fidgety/restless 0  0  Suicidal thoughts 0  0  PHQ-9 Score 0  1  Difficult doing work/chores Not difficult at all  Not difficult at all       02/17/2024    8:30 AM  GAD 7 : Generalized Anxiety Score  Nervous, Anxious, on Edge 0  Control/stop worrying 0  Worry too much - different  things 0  Trouble relaxing 0  Restless 0  Easily annoyed or irritable 0  Afraid - awful might happen 0  Total GAD 7 Score 0  Anxiety Difficulty Not difficult at all     Relevant past medical, surgical, family and social history reviewed and updated as indicated. Interim medical history since our last visit reviewed. Allergies and medications reviewed and updated.  Review of Systems  Ten systems reviewed and is negative except as mentioned in HPI      Objective:      BP 126/88   Pulse 94   Temp 97.9 F (36.6 C)   Resp 18   Ht 5' 4 (1.626 m)   Wt 184 lb 14.4 oz (83.9 kg)   SpO2 97%   BMI 31.74 kg/m    Wt Readings from Last 3 Encounters:  02/17/24 184 lb 14.4 oz (83.9 kg)  01/29/24 182 lb 11.2 oz (82.9 kg)  01/06/24 175 lb (79.4 kg)    Physical Exam VITALS: BP- 126/88 MEASUREMENTS: Weight- 104. GENERAL: Alert, cooperative, well developed, no acute distress HEENT: Normocephalic, normal oropharynx, moist mucous membranes CHEST: Clear to auscultation bilaterally, no wheezes, rhonchi, or crackles CARDIOVASCULAR: Normal heart rate and rhythm, S1 and S2 normal without murmurs ABDOMEN: Soft, non-tender, non-distended, without organomegaly, normal bowel sounds EXTREMITIES: No cyanosis or edema, right thumb healing well with improved appearance NEUROLOGICAL: Cranial nerves grossly intact, moves all extremities without gross motor or sensory deficit  Results for orders placed or performed during the hospital encounter of 12/24/23  Lipase, blood   Collection Time: 12/24/23 10:35 AM  Result Value Ref Range   Lipase 51 11 - 51 U/L  Comprehensive metabolic panel   Collection Time: 12/24/23 10:35 AM  Result Value Ref Range   Sodium 139 135 - 145 mmol/L   Potassium 4.2 3.5 - 5.1 mmol/L   Chloride 106 98 - 111 mmol/L   CO2 25 22 - 32 mmol/L   Glucose, Bld 110 (H) 70 - 99 mg/dL   BUN 24 (H) 6 - 20 mg/dL   Creatinine, Ser 9.36 0.44 - 1.00 mg/dL   Calcium 8.9 8.9 - 89.6 mg/dL    Total Protein 6.9 6.5 - 8.1 g/dL   Albumin 3.9 3.5 - 5.0 g/dL   AST 13 (L) 15 - 41 U/L   ALT 12 0 - 44 U/L   Alkaline Phosphatase 93 38 - 126 U/L   Total Bilirubin 0.6 0.0 - 1.2 mg/dL   GFR, Estimated >39 >39 mL/min   Anion gap 8 5 - 15  CBC   Collection Time: 12/24/23 10:35 AM  Result Value Ref Range   WBC  8.6 4.0 - 10.5 K/uL   RBC 5.22 (H) 3.87 - 5.11 MIL/uL   Hemoglobin 14.4 12.0 - 15.0 g/dL   HCT 55.5 63.9 - 53.9 %   MCV 85.1 80.0 - 100.0 fL   MCH 27.6 26.0 - 34.0 pg   MCHC 32.4 30.0 - 36.0 g/dL   RDW 86.1 88.4 - 84.4 %   Platelets 271 150 - 400 K/uL   nRBC 0.0 0.0 - 0.2 %  Urinalysis, Routine w reflex microscopic -Urine, Random   Collection Time: 12/24/23 11:25 AM  Result Value Ref Range   Color, Urine YELLOW (A) YELLOW   APPearance CLEAR (A) CLEAR   Specific Gravity, Urine 1.033 (H) 1.005 - 1.030   pH 5.0 5.0 - 8.0   Glucose, UA NEGATIVE NEGATIVE mg/dL   Hgb urine dipstick NEGATIVE NEGATIVE   Bilirubin Urine NEGATIVE NEGATIVE   Ketones, ur NEGATIVE NEGATIVE mg/dL   Protein, ur NEGATIVE NEGATIVE mg/dL   Nitrite NEGATIVE NEGATIVE   Leukocytes,Ua SMALL (A) NEGATIVE   RBC / HPF 0-5 0 - 5 RBC/hpf   WBC, UA 0-5 0 - 5 WBC/hpf   Bacteria, UA NONE SEEN NONE SEEN   Squamous Epithelial / HPF 0-5 0 - 5 /HPF   Mucus PRESENT           Assessment & Plan:   Problem List Items Addressed This Visit       Cardiovascular and Mediastinum   HTN (hypertension)   Relevant Orders   CBC with Differential/Platelet   Comprehensive metabolic panel with GFR     Respiratory   COPD (chronic obstructive pulmonary disease) (HCC)     Digestive   Esophageal dysmotility     Endocrine   Hypothyroidism   Relevant Orders   TSH   Type 2 diabetes mellitus without complication, without long-term current use of insulin  (HCC)   Relevant Medications   Blood Glucose Monitoring Suppl DEVI   Glucose Blood (BLOOD GLUCOSE TEST STRIPS) STRP   Lancet Device MISC   Lancets MISC      Nervous and Auditory   Neurofibromatosis, type 1 (HCC)     Other   Anxiety, generalized   Depression, major, recurrent, moderate (HCC)   Mixed hyperlipidemia   Relevant Orders   Lipid panel   Chronic cough - Primary   Restless leg syndrome   Relevant Medications   rOPINIRole  (REQUIP ) 2 MG tablet     Assessment and Plan Assessment & Plan Chronic cough due to esophageal dysmotility Chronic cough persists, likely due to esophageal dysmotility. The band at the stomach does not open, contributing to symptoms. Awaiting a specialist appointment in January for further evaluation and potential intervention, possibly a point procedure. - continue Tessalon  Perles as needed for cough  Restless legs syndrome Symptoms have worsened, particularly with fatigue and increased physical activity. Currently taking Requip  1 mg at bedtime. - Increase Requip  to 2 mg at bedtime. - Advise taking two of the current 1 mg tablets until the new prescription is filled.  Right thumb laceration, healing Right thumb laceration healing well with some residual numbness. Initially treated with liquid adhesive and splinting. -healing well  Hypertension Blood pressure is well-controlled at 126/88 mmHg. Currently taking metoprolol  25 mg daily for heart rate control. - Continue metoprolol  25 mg daily.  Hypothyroidism Taking levothyroxine  100 mcg daily. - Continue levothyroxine  100 mcg daily. - Order thyroid  function tests as part of routine lab work.  Hyperlipidemia Current lipid status unknown. - Order lipid panel to assess current cholesterol  levels. Lipid Panel     Component Value Date/Time   CHOL 283 (H) 12/29/2016 0728   TRIG 170 (H) 12/29/2016 0728   HDL 47 12/29/2016 0728   CHOLHDL 6.0 12/29/2016 0728   VLDL 34 12/29/2016 0728   LDLCALC 202 (H) 12/29/2016 0728     Diabetes -due for microalbumin urine, and labs -not currently on medication -lost her glucometer, new order placed      Follow up  plan: Return in 3 months (on 05/19/2024) for CPE after nov/ w/ pap, cpe with Dr. Glenard, cpe.

## 2024-02-18 ENCOUNTER — Ambulatory Visit: Payer: Self-pay | Admitting: Nurse Practitioner

## 2024-02-18 LAB — CBC WITH DIFFERENTIAL/PLATELET
Absolute Lymphocytes: 1524 {cells}/uL (ref 850–3900)
Absolute Monocytes: 437 {cells}/uL (ref 200–950)
Basophils Absolute: 81 {cells}/uL (ref 0–200)
Basophils Relative: 1.1 %
Eosinophils Absolute: 170 {cells}/uL (ref 15–500)
Eosinophils Relative: 2.3 %
HCT: 45.9 % — ABNORMAL HIGH (ref 35.0–45.0)
Hemoglobin: 15 g/dL (ref 11.7–15.5)
MCH: 28.4 pg (ref 27.0–33.0)
MCHC: 32.7 g/dL (ref 32.0–36.0)
MCV: 86.8 fL (ref 80.0–100.0)
MPV: 10.6 fL (ref 7.5–12.5)
Monocytes Relative: 5.9 %
Neutro Abs: 5187 {cells}/uL (ref 1500–7800)
Neutrophils Relative %: 70.1 %
Platelets: 346 Thousand/uL (ref 140–400)
RBC: 5.29 Million/uL — ABNORMAL HIGH (ref 3.80–5.10)
RDW: 14.9 % (ref 11.0–15.0)
Total Lymphocyte: 20.6 %
WBC: 7.4 Thousand/uL (ref 3.8–10.8)

## 2024-02-18 LAB — COMPREHENSIVE METABOLIC PANEL WITH GFR
AG Ratio: 2.3 (calc) (ref 1.0–2.5)
ALT: 15 U/L (ref 6–29)
AST: 13 U/L (ref 10–35)
Albumin: 4.5 g/dL (ref 3.6–5.1)
Alkaline phosphatase (APISO): 102 U/L (ref 37–153)
BUN: 18 mg/dL (ref 7–25)
CO2: 25 mmol/L (ref 20–32)
Calcium: 9.6 mg/dL (ref 8.6–10.4)
Chloride: 106 mmol/L (ref 98–110)
Creat: 0.85 mg/dL (ref 0.50–1.03)
Globulin: 2 g/dL (ref 1.9–3.7)
Glucose, Bld: 93 mg/dL (ref 65–99)
Potassium: 4.7 mmol/L (ref 3.5–5.3)
Sodium: 139 mmol/L (ref 135–146)
Total Bilirubin: 0.4 mg/dL (ref 0.2–1.2)
Total Protein: 6.5 g/dL (ref 6.1–8.1)
eGFR: 79 mL/min/1.73m2 (ref >=60)

## 2024-02-18 LAB — LIPID PANEL
Cholesterol: 245 mg/dL — ABNORMAL HIGH (ref <200)
HDL: 49 mg/dL — ABNORMAL LOW (ref >=50)
LDL Cholesterol (Calc): 168 mg/dL — ABNORMAL HIGH
Non-HDL Cholesterol (Calc): 196 mg/dL — ABNORMAL HIGH (ref <130)
Total CHOL/HDL Ratio: 5 (calc) — ABNORMAL HIGH (ref <5.0)
Triglycerides: 139 mg/dL (ref <150)

## 2024-02-18 LAB — HEMOGLOBIN A1C
Hgb A1c MFr Bld: 6.1 % — ABNORMAL HIGH (ref <5.7)
Mean Plasma Glucose: 128 mg/dL
eAG (mmol/L): 7.1 mmol/L

## 2024-02-18 LAB — TSH: TSH: 24.85 m[IU]/L — ABNORMAL HIGH (ref 0.40–4.50)

## 2024-02-18 LAB — MICROALBUMIN / CREATININE URINE RATIO
Creatinine, Urine: 251 mg/dL (ref 20–275)
Microalb Creat Ratio: 3 mg/g{creat} (ref <30)
Microalb, Ur: 0.7 mg/dL

## 2024-02-20 ENCOUNTER — Encounter: Payer: Self-pay | Admitting: Nurse Practitioner

## 2024-02-21 ENCOUNTER — Other Ambulatory Visit: Payer: Self-pay | Admitting: Family Medicine

## 2024-02-21 MED ORDER — DOXEPIN HCL 25 MG PO CAPS: 25.0000 mg | ORAL_CAPSULE | Freq: Every day | ORAL | 0 refills | Status: AC

## 2024-02-24 ENCOUNTER — Emergency Department

## 2024-02-24 ENCOUNTER — Emergency Department: Admission: EM | Admit: 2024-02-24 | Discharge: 2024-02-24 | Disposition: A | Discharge status: Home / Self Care

## 2024-02-24 ENCOUNTER — Other Ambulatory Visit: Payer: Self-pay

## 2024-02-24 DIAGNOSIS — J449 Chronic obstructive pulmonary disease, unspecified: Secondary | ICD-10-CM | POA: Insufficient documentation

## 2024-02-24 DIAGNOSIS — R052 Subacute cough: Secondary | ICD-10-CM | POA: Diagnosis present

## 2024-02-24 LAB — CBC WITH DIFFERENTIAL/PLATELET
Abs Immature Granulocytes: 0.08 K/uL — ABNORMAL HIGH (ref 0.00–0.07)
Basophils Absolute: 0.1 K/uL (ref 0.0–0.1)
Basophils Relative: 1 %
Eosinophils Absolute: 0.3 K/uL (ref 0.0–0.5)
Eosinophils Relative: 3 %
HCT: 42.3 % (ref 36.0–46.0)
Hemoglobin: 13.8 g/dL (ref 12.0–15.0)
Immature Granulocytes: 1 %
Lymphocytes Relative: 30 %
Lymphs Abs: 2.6 K/uL (ref 0.7–4.0)
MCH: 27.9 pg (ref 26.0–34.0)
MCHC: 32.6 g/dL (ref 30.0–36.0)
MCV: 85.6 fL (ref 80.0–100.0)
Monocytes Absolute: 0.7 K/uL (ref 0.1–1.0)
Monocytes Relative: 8 %
Neutro Abs: 4.9 K/uL (ref 1.7–7.7)
Neutrophils Relative %: 57 %
Platelets: 294 K/uL (ref 150–400)
RBC: 4.94 MIL/uL (ref 3.87–5.11)
RDW: 14.6 % (ref 11.5–15.5)
WBC: 8.5 K/uL (ref 4.0–10.5)
nRBC: 0 % (ref 0.0–0.2)

## 2024-02-24 LAB — BASIC METABOLIC PANEL WITH GFR
Anion gap: 7 (ref 5–15)
BUN: 19 mg/dL (ref 6–20)
CO2: 25 mmol/L (ref 22–32)
Calcium: 8.8 mg/dL — ABNORMAL LOW (ref 8.9–10.3)
Chloride: 107 mmol/L (ref 98–111)
Creatinine, Ser: 0.93 mg/dL (ref 0.44–1.00)
GFR, Estimated: >60 mL/min (ref >60)
Glucose, Bld: 106 mg/dL — ABNORMAL HIGH (ref 70–99)
Potassium: 3.5 mmol/L (ref 3.5–5.1)
Sodium: 139 mmol/L (ref 135–145)

## 2024-02-24 LAB — RESP PANEL BY RT-PCR (RSV, FLU A&B, COVID)  RVPGX2
Influenza A by PCR: NEGATIVE
Influenza B by PCR: NEGATIVE
Resp Syncytial Virus by PCR: NEGATIVE
SARS Coronavirus 2 by RT PCR: NEGATIVE

## 2024-02-24 LAB — PROCALCITONIN: Procalcitonin: <0.1 ng/mL

## 2024-02-24 MED ORDER — FAMOTIDINE IN NACL 20-0.9 MG/50ML-% IV SOLN
20.0000 mg | Freq: Once | INTRAVENOUS | Status: AC
  Administered 2024-02-24: 20 mg via INTRAVENOUS
  Filled 2024-02-24: qty 50

## 2024-02-24 MED ORDER — ONDANSETRON HCL 4 MG/2ML IJ SOLN
4.0000 mg | Freq: Once | INTRAMUSCULAR | Status: AC
  Administered 2024-02-24: 4 mg via INTRAVENOUS
  Filled 2024-02-24: qty 2

## 2024-02-24 MED ORDER — IPRATROPIUM-ALBUTEROL 0.5-2.5 (3) MG/3ML IN SOLN
3.0000 mL | Freq: Once | RESPIRATORY_TRACT | Status: AC
  Administered 2024-02-24: 3 mL via RESPIRATORY_TRACT
  Filled 2024-02-24: qty 3

## 2024-02-24 MED ORDER — HYDROCOD POLI-CHLORPHE POLI ER 10-8 MG/5ML PO SUER: 5.0000 mL | Freq: Two times a day (BID) | ORAL | 0 refills | Status: AC | PRN

## 2024-02-24 MED ORDER — BENZONATATE 100 MG PO CAPS
100.0000 mg | ORAL_CAPSULE | Freq: Once | ORAL | Status: AC
  Administered 2024-02-24: 100 mg via ORAL
  Filled 2024-02-24: qty 1

## 2024-02-24 MED ORDER — PANTOPRAZOLE SODIUM 40 MG IV SOLR
40.0000 mg | Freq: Once | INTRAVENOUS | Status: AC
  Administered 2024-02-24: 40 mg via INTRAVENOUS
  Filled 2024-02-24: qty 10

## 2024-02-24 MED ORDER — HYDROCOD POLI-CHLORPHE POLI ER 10-8 MG/5ML PO SUER
5.0000 mL | Freq: Once | ORAL | Status: AC
  Administered 2024-02-24: 5 mL via ORAL
  Filled 2024-02-24: qty 5

## 2024-02-24 NOTE — Discharge Instructions (Addendum)
 You were seen in the emergency department for worsening cough.  Workup today was reassuring.  Please continue your regular medications.  Do not drive or operate machinery while taking your Tussionex as it is an opioid.  Please follow-up with your primary care physician this week and return with any acutely worsening symptoms or any other emergency. -- RETURN PRECAUTIONS & AFTERCARE: (ENGLISH) RETURN PRECAUTIONS: Return immediately to the emergency department or see/call your doctor if you feel worse, weak or have changes in speech or vision, are short of breath, have fever, vomiting, pain, bleeding or dark stool, trouble urinating or any new issues. Return here or see/call your doctor if not improving as expected for your suspected condition. FOLLOW-UP CARE: Call your doctor and/or any doctors we referred you to for more advice and to make an appointment. Do this today, tomorrow or after the weekend. Some doctors only take PPO insurance so if you have HMO insurance you may want to contact your HMO or your regular doctor for referral to a specialist within your plan. Either way tell the doctor's office that it was a referral from the emergency department so you get the soonest possible appointment.  YOUR TEST RESULTS: Take result reports of any blood or urine tests, imaging tests and EKG's to your doctor and any referral doctor. Have any abnormal tests repeated. Your doctor or a referral doctor can let you know when this should be done. Also make sure your doctor contacts this hospital to get any test results that are not currently available such as cultures or special tests for infection and final imaging reports, which are often not available at the time you leave the ER but which may list additional important findings that are not documented on the preliminary report. BLOOD PRESSURE: If your blood pressure was greater than 120/80 have your blood pressure rechecked within 1 to 2 weeks. MEDICATION SIDE  EFFECTS: Do not drive, walk, bike, take the bus, etc. if you have received or are being prescribed any sedating medications such as those for pain or anxiety or certain antihistamines like Benadryl . If you have been give one of these here get a taxi home or have a friend drive you home. Ask your pharmacist to counsel you on potential side effects of any new medication

## 2024-02-24 NOTE — ED Provider Notes (Signed)
 [facility] Provider Note    Event Date/Time   First MD Initiated Contact with Patient 02/24/24 [phone]     (approximate)   History   Cough   HPI  Jenna Tyler is a 58 y.o. female chronic cough secondary to esophageal dysmotility, pectus excavatum, COPD, tracheobronchomalacia, alcohol use disorder in remission, neurofibromatosis type I who presents to the emergency department with 5 days of progressively worsening cough.  Patient states that this cough is worse than baseline and wonders whether she has any pneumonia.  Denies any fevers or chills or chest pain.  Denies any abdominal pain.  Reports compliance with all of her medication.      Physical Exam   Triage Vital Signs: ED Triage Vitals  Encounter Vitals Group     BP 02/24/24 0430 129/83     Girls Systolic BP Percentile --      Girls Diastolic BP Percentile --      Boys Systolic BP Percentile --      Boys Diastolic BP Percentile --      Pulse Rate 02/24/24 0430 93     Resp 02/24/24 0430 18     Temp 02/24/24 0430 98.3 F (36.8 C)     Temp src --      SpO2 02/24/24 0430 100 %     Weight 02/24/24 0428 182 lb (82.6 kg)     Height 02/24/24 0428 5' 5 (1.651 m)     Head Circumference --      Peak Flow --      Pain Score --      Pain Loc --      Pain Education --      Exclude from Growth Chart --     Most recent vital signs: Vitals:   02/24/24 0630 02/24/24 0645  BP: 111/64 104/76  Pulse: 81 84  Resp: 18 20  Temp:    SpO2: 100% 95%    Nursing Triage Note reviewed. Vital signs reviewed and patients oxygen saturation is normoxic  General: Patient is well nourished, well developed, awake and alert, resting comfortably in no acute distress Head: Normocephalic and atraumatic Eyes: Normal inspection, extraocular muscles intact, no conjunctival pallor Ear, nose, throat: Normal external exam Neck: Normal range of motion, no crepitus Respiratory: Patient is in no respiratory distress, lungs  mild wheezes Patient has dry nonproductive cough Cardiovascular: Patient is not tachycardic, RR GI: Abd SNT with no guarding or rebound  Back: Normal inspection of the back with good strength and range of motion throughout all ext Extremities: pulses intact with good cap refills, no LE pitting edema or calf tenderness Neuro: The patient is alert and oriented to person, place, and time, appropriately conversive, with 5/5 bilat UE/LE strength, no gross motor or sensory defects noted. Coordination appears to be adequate. Skin: Warm, dry, and intact Psych: normal mood and affect, no SI or HI  ED Results / Procedures / Treatments   Labs (all labs ordered are listed, but only abnormal results are displayed) Labs Reviewed  CBC WITH DIFFERENTIAL/PLATELET - Abnormal; Notable for the following components:      Result Value   Abs Immature Granulocytes 0.08 (*)    All other components within normal limits  BASIC METABOLIC PANEL WITH GFR - Abnormal; Notable for the following components:   Glucose, Bld 106 (*)    Calcium 8.8 (*)    All other components within normal limits  RESP PANEL BY RT-PCR (RSV, FLU A&B, COVID)  [id number]  PROCALCITONIN     EKG EKG and rhythm strip are interpreted by myself:   EKG: [Normal sinus rhythm] at heart rate of 91, normal QRS duration, QTc 420, nonspecific ST segments and T waves no ectopy EKG not consistent with Acute STEMI Rhythm strip: NSR in lead II   RADIOLOGY Xray chest: No acute abnormality on my independent review interpretation radiologist reads this as:  1. No acute findings.  2. Bibasilar reticular nodularity, more pronounced on the left, appears chronic  and unchanged.      PROCEDURES:  Critical Care performed: No  Procedures   MEDICATIONS ORDERED IN ED: Medications  ipratropium-albuterol  (DUONEB) 0.5-2.5 (3) MG/3ML nebulizer solution 3 mL (3 mLs Nebulization Given 02/24/24 0457)  ipratropium-albuterol  (DUONEB) 0.5-2.5 (3) MG/3ML  nebulizer solution 3 mL (3 mLs Nebulization Given 02/24/24 0456)  ondansetron  (ZOFRAN ) injection 4 mg (4 mg Intravenous Given 02/24/24 0500)  benzonatate  (TESSALON ) capsule 100 mg (100 mg Oral Given 02/24/24 0540)  famotidine  (PEPCID ) IVPB 20 mg premix (0 mg Intravenous Stopped 02/24/24 0650)  pantoprazole  (PROTONIX ) injection 40 mg (40 mg Intravenous Given 02/24/24 0611)  chlorpheniramine-HYDROcodone  (TUSSIONEX) 10-8 MG/5ML suspension 5 mL (5 mLs Oral Given 02/24/24 0618)     IMPRESSION / MDM / ASSESSMENT AND PLAN / ED COURSE                                Differential diagnosis includes, but is not limited to, acute on chronic cough, pneumonia, URI, COPD exacerbation, GERD, electrolyte derangement   ED course: EKG demonstrated no evidence of acute ischemia and patient is satting 100% on room air.  Given the wheezes and history of COPD I did administer 2 DuoNebs without any appropriate resolved and patient's cough.  Respiratory panel was unremarkable.  Chest x-ray demonstrated no new pneumonia.  CBC without leukocytosis and patient had no profound electrolyte derangements.  Multiple modalities were attempted to control patient's symptoms including Protonix , Pepcid , Zofran  and Tessalon  Perles without improvement.  Patient tells me that the only thing that works for her symptoms is Tussionex which unfortunately is an opioid.  Patient has a pill symptomatic and this was delivered with some improvement in symptoms.  Patient does request a small prescription which I have given her.  She will return if any acutely worsening symptoms and she was advised to follow-up with her primary care physician   Clinical Course as of 02/24/24 0745  Mon Feb 24, 2024  0525 WBC: 8.5 No leukocytosis [HD]  0552 Basic metabolic panel(!) No profound electrolyte derangements [HD]  0552 Resp panel by RT-PCR (RSV, Flu A&B, Covid) Anterior Nasal Swab COVID and influenza negative [HD]    Clinical Course User Index [HD]  Nicholaus Rolland BRAVO, MD   At time of discharge there is no evidence of acute life, limb, vision, or fertility threat. Patient has stable vital signs, pain is well controlled, patient is ambulatory and p.o. tolerant.  Discharge instructions were completed using the EPIC system. I would refer you to those at this time. All warnings prescriptions follow-up etc. were discussed in detail with the patient. Patient indicates understanding and is agreeable with this plan. All questions answered.  Patient is made aware that they may return to the emergency department for any worsening or new condition or for any other emergency.   -- Risk: 5 This patient has a high risk of morbidity due to further diagnostic testing or treatment. Rationale: This patient's evaluation and management  involve a high risk of morbidity due to the potential severity of presenting symptoms, need for diagnostic testing, and/or initiation of treatment that may require close monitoring. The differential includes conditions with potential for significant deterioration or requiring escalation of care. Treatment decisions in the ED, including medication administration, procedural interventions, or disposition planning, reflect this level of risk. COPA: 5 The patient has the following acute or chronic illness/injury that poses a possible threat to life or bodily function: [X] : The patient has a potentially serious acute condition or an acute exacerbation of a chronic illness requiring urgent evaluation and management in the Emergency Department. The clinical presentation necessitates immediate consideration of life-threatening or function-threatening diagnoses, even if they are ultimately ruled out.   FINAL CLINICAL IMPRESSION(S) / ED DIAGNOSES   Final diagnoses:  Subacute cough     Rx / DC Orders   ED Discharge Orders          Ordered    chlorpheniramine-HYDROcodone  (TUSSIONEX) 10-8 MG/5ML  Every 12 hours PRN        02/24/24 0630              Note:  This document was prepared using Dragon voice recognition software and may include unintentional dictation errors.   Nicholaus Rolland BRAVO, MD 02/24/24 [phone]

## 2024-02-24 NOTE — ED Triage Notes (Addendum)
 Pt reports couch congestion and shortness of breath for the past few days. Pt also reports some left side rib discomfort when coughing

## 2024-02-27 ENCOUNTER — Ambulatory Visit: Payer: Self-pay

## 2024-02-27 NOTE — Telephone Encounter (Signed)
 FYI Only or Action Required?: FYI only for provider: ED advised.  Patient was last seen in primary care on 02/17/2024 by Gareth Mliss FALCON, FNP.  Called Nurse Triage reporting Chest Pain.  Symptoms began today.  Interventions attempted: Nothing.  Symptoms are: unchanged.  Triage Disposition: Go to ED Now (or PCP Triage)  Patient/caregiver understands and will follow disposition?: Yes Pt reports episode of severe back pain radiating to her chest and her jaw.  Pt reports nausea and sweating at that time. Symptoms subsided after trying to make herself vomit.  Pt reports history of esophageal motility problems and attributes thatto some of her discomfort, but pain remains constant in her mid chest area.  Pt also reports oxygen level dropped to 90% during the episode but is now fluctuating from 93-94%.  Rn advising ED at this time. Pt is reluctant but agreeable.    Copied from CRM #[id number]. Topic: Clinical - Red Word Triage >> Feb 27, 2024  1:37 PM Jenna Tyler wrote: Red Word that prompted transfer to Nurse Triage: pt has esophageal problems, ate a potato soup started off as upper back pain that moved to chest that lasted 5-10 minutes (has never experienced this before). Pt feels nauseous. Oxygen levels is going down to 94-90 and heart rate was 60, now is normal. Reason for Disposition  [1] Chest pain lasts > 5 minutes AND [2] occurred in past 3 days (72 hours) (Exception: Feels exactly the same as previously diagnosed heartburn and has accompanying sour taste in mouth.)  Answer Assessment - Initial Assessment Questions 1. LOCATION: Where does it hurt?       Pain started in upper back and went through the chest, middle of chest  2. RADIATION: Does the pain go anywhere else? (e.g., into neck, jaw, arms, back)     Radiated to upper chest, up to jaw  3. ONSET: When did the chest pain begin? (Minutes, hours or days)      Approx 1 hour ago. Still has some pain   4. PATTERN: Does the pain come  and go, or has it been constant since it started?  Does it get worse with exertion?      Constant pain for the past 1 hour, but pain level has subsided from severe to mild  5. DURATION: How long does it last (e.g., seconds, minutes, hours)     5-10 minutes  6. SEVERITY: How bad is the pain?  (e.g., Scale 1-10; mild, moderate, or severe)     3/10 at the moment, initially was severe  7. CARDIAC RISK FACTORS: Do you have any history of heart problems or risk factors for heart disease? (e.g., angina, prior heart attack; diabetes, high blood pressure, high cholesterol, smoker, or strong family history of heart disease)     Family history of heart disease. Father, grandfather, and uncles died at young ages of heart attack  8. PULMONARY RISK FACTORS: Do you have any history of lung disease?  (e.g., blood clots in lung, asthma, emphysema, birth control pills)     Hx of COPD, asthma  9. CAUSE: What do you think is causing the chest pain?     Unsure of cause, thinks it could be related to esophageal motility issues since the episode started immediately after eating steak and potatoe soup  10. OTHER SYMPTOMS: Do you have any other symptoms? (e.g., dizziness, nausea, vomiting, sweating, fever, difficulty breathing, cough)       Sweating and nausea, O2 level 94 to 90, then back  up to 94%  11. PREGNANCY: Is there any chance you are pregnant? When was your last menstrual period?       No  Protocols used: Chest Pain-A-AH

## 2024-02-28 NOTE — Progress Notes (Signed)
 [facility] [address] [place], [place] [address]  Pulmonary Sleep Medicine   Office Visit Note  Patient Name: Jenna Tyler DOB: 08/05/1965 MRN [id number]    Chief Complaint: Obstructive Sleep Apnea visit  Brief History:  Jenna Tyler is seen today for a 4 month follow up visit for APAP@ 10-17 cmH2O. The patient has a 3 year history of sleep apnea. Patient is not using PAP nightly.  The patient feels not rested after sleeping with PAP.  The patient reports no benefit from PAP use. Reported sleepiness is  not improved and the Epworth Sleepiness Score is 18 out of 24. The patient takes a nap daily 45 min-1 hour. The patient complains of the following: coughing at night that keeps her from using her therapy consistently.  She has an esophageal issue which is being treated and isn't resolved yet. 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 She feels that situation is improved.  The compliance download shows 19% compliance with an average use time of 3 hours 52 minutes. The AHI is 4.2.  The patient does complain of limb movements disrupting sleep, but ropinirole  is helpful. The patient continues to require PAP therapy in order to eliminate sleep apnea.   ROS  General: (-) fever, (-) chills, (-) night sweat Nose and Sinuses: (-) nasal stuffiness or itchiness, (-) postnasal drip, (-) nosebleeds, (-) sinus trouble. Mouth and Throat: (-) sore throat, (-) hoarseness. Neck: (-) swollen glands, (-) enlarged thyroid , (-) neck pain. Respiratory: + cough, - shortness of breath, - wheezing. Neurologic: - numbness, - tingling. Psychiatric: - anxiety, - depression   Current Medication: Outpatient Encounter Medications as of 03/02/2024  Medication Sig   butalbital -acetaminophen -caffeine  (FIORICET ) 50-325-40 MG tablet TAKE ONE TABLET BY MOUTH AT HEADCACHE ONSET. CAN REPEAT ONCE IN 4 HOURS IF NEEDED. NO MORE THAN 2 IN 24 HRS. NO MORE THAN 4 IN A WEEK   albuterol   (PROVENTIL ) (2.5 MG/3ML) 0.083% nebulizer solution Take 2.5 mg by nebulization every 4 (four) hours as needed for wheezing or shortness of breath.   benzonatate  (TESSALON ) 100 MG capsule Take 1 capsule (100 mg total) by mouth 2 (two) times daily as needed for cough.   Blood Glucose Monitoring Suppl (GLUCOCOM BLOOD GLUCOSE MONITOR) DEVI 1 each as directed   Blood Glucose Monitoring Suppl DEVI 1 each by Does not apply route in the morning, at noon, and at bedtime. May substitute to any manufacturer covered by patient's insurance.   [EXPIRED] chlorpheniramine-HYDROcodone  (TUSSIONEX) 10-8 MG/5ML Take 5 mLs by mouth every 12 (twelve) hours as needed for up to 5 days for cough.   dicyclomine  (BENTYL ) 10 MG capsule Take 1 capsule (10 mg total) by mouth every 8 (eight) hours as needed.   doxepin (SINEQUAN) 25 MG capsule Take 1 capsule (25 mg total) by mouth at bedtime.   gabapentin  (NEURONTIN ) 800 MG tablet Take 800 mg by mouth 2 (two) times daily.   Glucose Blood (BLOOD GLUCOSE TEST STRIPS) STRP 1 each by In Vitro route in the morning, at noon, and at bedtime. May substitute to any manufacturer covered by patient's insurance.   ibuprofen (ADVIL) 200 MG tablet Take 200 mg by mouth every 6 (six) hours as needed for headache, mild pain (pain score 1-3) or fever.   Lancet Device MISC 1 each by Does not apply route in the morning, at noon, and at bedtime. May substitute to any manufacturer covered by patient's insurance.   Lancets MISC 1 each by Does  not apply route as directed. Dispense based on patient and insurance preference. Use up to four times daily as directed. (FOR ICD-10 E10.9, E11.9).   levothyroxine  (SYNTHROID ) 100 MCG tablet Take 1 tablet (100 mcg total) by mouth daily before breakfast.   methocarbamol  (ROBAXIN ) 750 MG tablet Take 750 mg by mouth 2 (two) times daily.   metoCLOPramide  (REGLAN ) 10 MG tablet Take 1 tablet (10 mg total) by mouth every 8 (eight) hours as needed.   metoprolol  tartrate  (LOPRESSOR ) 25 MG tablet Take 1 tablet (25 mg total) by mouth every morning.   ondansetron  (ZOFRAN -ODT) 4 MG disintegrating tablet Take 1 tablet (4 mg total) by mouth every 6 (six) hours as needed for nausea or vomiting.   pantoprazole  (PROTONIX ) 40 MG tablet Take 1 tablet (40 mg total) by mouth 2 (two) times daily.   Respiratory Therapy Supplies (NEBULIZER/TUBING/MOUTHPIECE) KIT Disp one nebulizer machine, tubing set and mouthpiece kit   rOPINIRole  (REQUIP ) 2 MG tablet Take 1 tablet (2 mg total) by mouth at bedtime as needed (RLS/insomnia).   No facility-administered encounter medications on file as of 03/02/2024.    Surgical History: Past Surgical History:  Procedure Laterality Date   ABDOMINAL HYSTERECTOMY     APPENDECTOMY  2023   BREAST BIOPSY Right 2012   negative   BRONCHIAL DILITATION  07/17/2023   BRONCHIAL WASHINGS N/A 09/28/2022   Procedure: BRONCHIAL WASHINGS;  Surgeon: Parris Manna, MD;  Location: [facility] ORS;  Service: Thoracic;  Laterality: N/A;   CESAREAN SECTION  2020   CHOLECYSTECTOMY     COLON SURGERY     ESOPHAGOGASTRODUODENOSCOPY N/A 12/02/2023   Procedure: EGD (ESOPHAGOGASTRODUODENOSCOPY);  Surgeon: Onita Elspeth Sharper, DO;  Location: [facility] ENDOSCOPY;  Service: Gastroenterology;  Laterality: N/A;   FLEXIBLE BRONCHOSCOPY N/A 09/28/2022   Procedure: FLEXIBLE BRONCHOSCOPY;  Surgeon: Parris Manna, MD;  Location: [facility] ORS;  Service: Thoracic;  Laterality: N/A;   FOOT SURGERY     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   OTHER SURGICAL HISTORY     excision of lipoma   OTHER SURGICAL HISTORY     Abdominal surgery   SMALL INTESTINE SURGERY  2026   THYROID  SURGERY     thyroidectomy   TUBAL LIGATION     XI ROBOTIC LAPAROSCOPIC ASSISTED APPENDECTOMY N/A 04/08/2020   Procedure: XI ROBOTIC LAPAROSCOPIC ASSISTED APPENDECTOMY;  Surgeon: Tye Millet, DO;  Location: [facility] ORS;  Service: General;  Laterality: N/A;    Medical History: Past Medical History:  Diagnosis Date    AKI (acute kidney injury) 07/12/2017   Allergy    Asthma    COPD (chronic obstructive pulmonary disease) (HCC)    COPD exacerbation (HCC) 12/29/2016   COPD with acute exacerbation (HCC) 10/02/2022   DM (diabetes mellitus), type 2 (HCC)    Dyspnea    Esophageal dysmotility    Fibromyalgia    GERD (gastroesophageal reflux disease)    Guillain Barr syndrome    Headache    Hyperlipidemia    Hypertension    Hypothyroidism    Neurofibromatosis, peripheral, NF1 (HCC) 2017   Pancreatitis    PONV (postoperative nausea and vomiting)    Pulmonary nodule    RLS (restless legs syndrome)    Severe sepsis (HCC) 07/10/2019   Sleep apnea    never went to pick up her cpap   Substance induced mood disorder (HCC) 12/29/2016   Tachycardia    Tracheomalacia     Family History: Non contributory to the present illness  Social History: Social History   Socioeconomic  History   Marital status: Divorced    Spouse name: Not on file   Number of children: Not on file   Years of education: Not on file   Highest education level: 12th grade  Occupational History   Not on file  Tobacco Use   Smoking status: Former    Current packs/day: 0.00    Average packs/day: 1 pack/day for 15.0 years (15.0 ttl pk-yrs)    Types: Cigarettes    Start date: 12/29/1982    Quit date: 12/28/1997    Years since quitting: 26.1   Smokeless tobacco: Never  Vaping Use   Vaping status: Never Used  Substance and Sexual Activity   Alcohol use: Not Currently   Drug use: No   Sexual activity: Not Currently    Birth control/protection: Surgical  Other Topics Concern   Not on file  Social History Narrative   Not on file   Social Drivers of Health   Financial Resource Strain: Medium Risk (02/16/2024)   Overall Financial Resource Strain (CARDIA)    Difficulty of Paying Living Expenses: Somewhat hard  Food Insecurity: Food Insecurity Present (02/16/2024)   Hunger Vital Sign    Worried About Running Out of Food in  the Last Year: Sometimes true    Ran Out of Food in the Last Year: Never true  Transportation Needs: No Transportation Needs (02/16/2024)   PRAPARE - Administrator, Civil Service (Medical): No    Lack of Transportation (Non-Medical): No  Physical Activity: Insufficiently Active (02/16/2024)   Exercise Vital Sign    Days of Exercise per Week: 2 days    Minutes of Exercise per Session: 20 min  Stress: No Stress Concern Present (02/16/2024)   [employer] of Occupational Health - Occupational Stress Questionnaire    Feeling of Stress: Only a little  Social Connections: Moderately Integrated (02/16/2024)   Social Connection and Isolation Panel    Frequency of Communication with Friends and Family: More than three times a week    Frequency of Social Gatherings with Friends and Family: Twice a week    Attends Religious Services: More than 4 times per year    Active Member of [employer] or Organizations: Yes    Attends Engineer, Structural: More than 4 times per year    Marital Status: Divorced  Intimate Partner Violence: Not At Risk (08/13/2023)   Humiliation, Afraid, Rape, and Kick questionnaire    Fear of Current or Ex-Partner: No    Emotionally Abused: No    Physically Abused: No    Sexually Abused: No    Vital Signs: Blood pressure 113/81, pulse 77, resp. rate 12, height 5' 5 (1.651 m), weight 184 lb 3.2 oz (83.6 kg), SpO2 98%. Body mass index is 30.65 kg/m.    Examination: General Appearance: The patient is well-developed, well-nourished, and in no distress. Neck Circumference: 40cm Skin: Gross inspection of skin unremarkable. Head: normocephalic, no gross deformities. Eyes: no gross deformities noted. ENT: ears appear grossly normal Neurologic: Alert and oriented. No involuntary movements.  STOP BANG RISK ASSESSMENT S (snore) Have you been told that you snore?     No   T (tired) Are you often tired, fatigued, or sleepy during the day?   YES  O  (obstruction) Do you stop breathing, choke, or gasp during sleep? YES   P (pressure) Do you have or are you being treated for high blood pressure? NO   B (BMI) Is your body index greater than 35 kg/m?  NO   A (age) Are you 77 years old or older? YES   N (neck) Do you have a neck circumference greater than 16 inches?   NO   G (gender) Are you a female? NO   TOTAL STOP/BANG "YES" ANSWERS 3       A STOP-Bang score of 2 or less is considered low risk, and a score of 5 or more is high risk for having either moderate or severe OSA. For people who score 3 or 4, doctors may need to perform further assessment to determine how likely they are to have OSA.         EPWORTH SLEEPINESS SCALE:  Scale:  (0)= no chance of dozing; (1)= slight chance of dozing; (2)= moderate chance of dozing; (3)= high chance of dozing  Chance  Situtation    Sitting and reading: 4    Watching TV: 4    Sitting Inactive in public: 1    As a passenger in car: 1    Lying down to rest: 4    Sitting and talking: 0    Sitting quielty after lunch: 4    In a car, stopped in traffic: 0   TOTAL SCORE:   18 out of 24    SLEEP STUDIES:  PSG (12/2020) AHI 10.4/hr, REM AHI 26.3/hr, min SpO2 84% Titration (12/2020) CPAP@ 8 cmH2O Titration (04/2023) Recommend repeat titration w/ sleep aid Titration (05/2023) APAP@ 10-17 cmH2O   CPAP COMPLIANCE DATA:  Date Range: 10/29/2023-02/25/2024  Average Daily Use: 3 hours 52 minutes  Median Use: 4 hours 8 minutes  Compliance for > 4 Hours: 19%  AHI: 4.2 respiratory events per hour  Days Used: 38/120 days  Mask Leak: 2.5  95th Percentile Pressure: 14.4         LABS: Recent Results (from the past 2160 hours)  POCT urinalysis dipstick     Status: None   Collection Time: 12/13/23  1:06 PM  Result Value Ref Range   Color, UA Yellow    Clarity, UA Clear    Glucose, UA Negative Negative   Bilirubin, UA Negative    Ketones, UA Negative    Spec Grav, UA  1.020 1.010 - 1.025   Blood, UA Negative    pH, UA 6.0 5.0 - 8.0   Protein, UA Negative Negative   Urobilinogen, UA 0.2 0.2 or 1.0 E.U./dL   Nitrite, UA Negative    Leukocytes, UA Negative Negative   Appearance Normal    Odor None   Urine Culture     Status: None   Collection Time: 12/13/23  2:29 PM   Specimen: Urine  Result Value Ref Range   MICRO NUMBER: [id number]    SPECIMEN QUALITY: Adequate    Sample Source URINE    STATUS: FINAL    Result:      Less than 10,000 CFU/mL of single Gram negative organism isolated. No further testing will be performed. If clinically indicated, recollection using a method to minimize contamination, with prompt transfer to Urine Culture Transport Tube, is recommended.  Lipase, blood     Status: None   Collection Time: 12/24/23 10:35 AM  Result Value Ref Range   Lipase 51 11 - 51 U/L    Comment: Performed at [facility], [address] [place], [place] [address]  Comprehensive metabolic panel     Status: Abnormal   Collection Time: 12/24/23 10:35 AM  Result Value Ref Range   Sodium 139 135 - 145 mmol/L  Potassium 4.2 3.5 - 5.1 mmol/L   Chloride 106 98 - 111 mmol/L   CO2 25 22 - 32 mmol/L   Glucose, Bld 110 (H) 70 - 99 mg/dL    Comment: Glucose reference range applies only to samples taken after fasting for at least 8 hours.   BUN 24 (H) 6 - 20 mg/dL   Creatinine, Ser 9.36 0.44 - 1.00 mg/dL   Calcium 8.9 8.9 - 89.6 mg/dL   Total Protein 6.9 6.5 - 8.1 g/dL   Albumin 3.9 3.5 - 5.0 g/dL   AST 13 (L) 15 - 41 U/L   ALT 12 0 - 44 U/L   Alkaline Phosphatase 93 38 - 126 U/L   Total Bilirubin 0.6 0.0 - 1.2 mg/dL   GFR, Estimated >39 >39 mL/min    Comment: (NOTE) Calculated using the CKD-EPI Creatinine Equation (2021)    Anion gap 8 5 - 15    Comment: Performed at [facility], [address] [place], [place] [address]  CBC     Status: Abnormal   Collection Time: 12/24/23 10:35 AM  Result Value Ref Range   WBC 8.6 4.0 -  10.5 K/uL   RBC 5.22 (H) 3.87 - 5.11 MIL/uL   Hemoglobin 14.4 12.0 - 15.0 g/dL   HCT 55.5 63.9 - 53.9 %   MCV 85.1 80.0 - 100.0 fL   MCH 27.6 26.0 - 34.0 pg   MCHC 32.4 30.0 - 36.0 g/dL   RDW 86.1 88.4 - 84.4 %   Platelets 271 150 - 400 K/uL   nRBC 0.0 0.0 - 0.2 %    Comment: Performed at [facility], [address] [place], [place] [address]  Urinalysis, Routine w reflex microscopic -Urine, Random     Status: Abnormal   Collection Time: 12/24/23 11:25 AM  Result Value Ref Range   Color, Urine YELLOW (A) YELLOW   APPearance CLEAR (A) CLEAR   Specific Gravity, Urine 1.033 (H) 1.005 - 1.030   pH 5.0 5.0 - 8.0   Glucose, UA NEGATIVE NEGATIVE mg/dL   Hgb urine dipstick NEGATIVE NEGATIVE   Bilirubin Urine NEGATIVE NEGATIVE   Ketones, ur NEGATIVE NEGATIVE mg/dL   Protein, ur NEGATIVE NEGATIVE mg/dL   Nitrite NEGATIVE NEGATIVE   Leukocytes,Ua SMALL (A) NEGATIVE   RBC / HPF 0-5 0 - 5 RBC/hpf   WBC, UA 0-5 0 - 5 WBC/hpf   Bacteria, UA NONE SEEN NONE SEEN   Squamous Epithelial / HPF 0-5 0 - 5 /HPF   Mucus PRESENT     Comment: Performed at [facility], [address] [place], [place] [address]  CBC with Differential/Platelet     Status: Abnormal   Collection Time: 02/17/24  9:28 AM  Result Value Ref Range   WBC 7.4 3.8 - 10.8 Thousand/uL   RBC 5.29 (H) 3.80 - 5.10 Million/uL   Hemoglobin 15.0 11.7 - 15.5 g/dL   HCT 54.0 (H) 64.9 - 54.9 %   MCV 86.8 80.0 - 100.0 fL   MCH 28.4 27.0 - 33.0 pg   MCHC 32.7 32.0 - 36.0 g/dL    Comment: For adults, a slight decrease in the calculated MCHC value (in the range of 30 to 32 g/dL) is most likely not clinically significant; however, it should be interpreted with caution in correlation with other red cell parameters and the patient's clinical condition.    RDW 14.9 11.0 - 15.0 %   Platelets 346 140 - 400 Thousand/uL   MPV 10.6  7.5 - 12.5 fL   Neutro Abs 5,187 1,500 - 7,800 cells/uL   Absolute Lymphocytes 1,524 850 -  3,900 cells/uL   Absolute Monocytes 437 200 - 950 cells/uL   Eosinophils Absolute 170 15 - 500 cells/uL   Basophils Absolute 81 0 - 200 cells/uL   Neutrophils Relative % 70.1 %   Total Lymphocyte 20.6 %   Monocytes Relative 5.9 %   Eosinophils Relative 2.3 %   Basophils Relative 1.1 %  Comprehensive metabolic panel with GFR     Status: None   Collection Time: 02/17/24  9:28 AM  Result Value Ref Range   Glucose, Bld 93 65 - 99 mg/dL    Comment: .            Fasting reference interval .    BUN 18 7 - 25 mg/dL   Creat 9.14 9.49 - 8.96 mg/dL   eGFR 79 > OR = 60 fO/fpw/8.26f7   BUN/Creatinine Ratio SEE NOTE: 6 - 22 (calc)    Comment:    Not Reported: BUN and Creatinine are within    reference range. .    Sodium 139 135 - 146 mmol/L   Potassium 4.7 3.5 - 5.3 mmol/L   Chloride 106 98 - 110 mmol/L   CO2 25 20 - 32 mmol/L   Calcium 9.6 8.6 - 10.4 mg/dL   Total Protein 6.5 6.1 - 8.1 g/dL   Albumin 4.5 3.6 - 5.1 g/dL   Globulin 2.0 1.9 - 3.7 g/dL (calc)   AG Ratio 2.3 1.0 - 2.5 (calc)   Total Bilirubin 0.4 0.2 - 1.2 mg/dL   Alkaline phosphatase (APISO) 102 37 - 153 U/L   AST 13 10 - 35 U/L   ALT 15 6 - 29 U/L  Lipid panel     Status: Abnormal   Collection Time: 02/17/24  9:28 AM  Result Value Ref Range   Cholesterol 245 (H) <200 mg/dL   HDL 49 (L) > OR = 50 mg/dL   Triglycerides 860 <849 mg/dL   LDL Cholesterol (Calc) 168 (H) mg/dL (calc)    Comment: Reference range: <100 . Desirable range <100 mg/dL for primary prevention;   <70 mg/dL for patients with CHD or diabetic patients  with > or = 2 CHD risk factors. SABRA LDL-C is now calculated using the Martin-Hopkins  calculation, which is a validated novel method providing  better accuracy than the Friedewald equation in the  estimation of LDL-C.  Gladis APPLETHWAITE et al. SANDREA. 7986;689(80): 2061-2068  (http://education.[employer].com/faq/FAQ164)    Total CHOL/HDL Ratio 5.0 (H) <5.0 (calc)   Non-HDL Cholesterol (Calc) 196 (H)  <130 mg/dL (calc)    Comment: For patients with diabetes plus 1 major ASCVD risk  factor, treating to a non-HDL-C goal of <100 mg/dL  (LDL-C of <29 mg/dL) is considered a therapeutic  option.   Hemoglobin A1c     Status: Abnormal   Collection Time: 02/17/24  9:28 AM  Result Value Ref Range   Hgb A1c MFr Bld 6.1 (H) <5.7 %    Comment: For someone without known diabetes, a hemoglobin  A1c value between 5.7% and 6.4% is consistent with prediabetes and should be confirmed with a  follow-up test. . For someone with known diabetes, a value <7% indicates that their diabetes is well controlled. A1c targets should be individualized based on duration of diabetes, age, comorbid conditions, and other considerations. . This assay result is consistent with an increased risk of diabetes. . Currently, no consensus exists regarding  use of hemoglobin A1c for diagnosis of diabetes for children. .    Mean Plasma Glucose 128 mg/dL   eAG (mmol/L) 7.1 mmol/L  TSH     Status: Abnormal   Collection Time: 02/17/24  9:28 AM  Result Value Ref Range   TSH 24.85 (H) 0.40 - 4.50 mIU/L  Microalbumin / creatinine urine ratio     Status: None   Collection Time: 02/17/24  9:28 AM  Result Value Ref Range   Creatinine, Urine 251 20 - 275 mg/dL   Microalb, Ur 0.7 mg/dL    Comment: Reference Range Not established    Microalb Creat Ratio 3 <30 mg/g creat    Comment: . The ADA defines abnormalities in albumin excretion as follows: SABRA Albuminuria Category        Result (mg/g creatinine) . Normal to Mildly increased   <30 Moderately increased         30-299  Severely increased           > OR = 300 . The ADA recommends that at least two of three specimens collected within a 3-6 month period be abnormal before considering a patient to be within a diagnostic category.   CBC with Differential     Status: Abnormal   Collection Time: 02/24/24  4:32 AM  Result Value Ref Range   WBC 8.5 4.0 - 10.5 K/uL    RBC 4.94 3.87 - 5.11 MIL/uL   Hemoglobin 13.8 12.0 - 15.0 g/dL   HCT 57.6 63.9 - 53.9 %   MCV 85.6 80.0 - 100.0 fL   MCH 27.9 26.0 - 34.0 pg   MCHC 32.6 30.0 - 36.0 g/dL   RDW 85.3 88.4 - 84.4 %   Platelets 294 150 - 400 K/uL   nRBC 0.0 0.0 - 0.2 %   Neutrophils Relative % 57 %   Neutro Abs 4.9 1.7 - 7.7 K/uL   Lymphocytes Relative 30 %   Lymphs Abs 2.6 0.7 - 4.0 K/uL   Monocytes Relative 8 %   Monocytes Absolute 0.7 0.1 - 1.0 K/uL   Eosinophils Relative 3 %   Eosinophils Absolute 0.3 0.0 - 0.5 K/uL   Basophils Relative 1 %   Basophils Absolute 0.1 0.0 - 0.1 K/uL   Immature Granulocytes 1 %   Abs Immature Granulocytes 0.08 (H) 0.00 - 0.07 K/uL    Comment: Performed at [facility], [address], [place], [place] [address]  Basic metabolic panel     Status: Abnormal   Collection Time: 02/24/24  4:32 AM  Result Value Ref Range   Sodium 139 135 - 145 mmol/L   Potassium 3.5 3.5 - 5.1 mmol/L   Chloride 107 98 - 111 mmol/L   CO2 25 22 - 32 mmol/L   Glucose, Bld 106 (H) 70 - 99 mg/dL    Comment: Glucose reference range applies only to samples taken after fasting for at least 8 hours.   BUN 19 6 - 20 mg/dL   Creatinine, Ser 9.06 0.44 - 1.00 mg/dL   Calcium 8.8 (L) 8.9 - 10.3 mg/dL   GFR, Estimated >39 >39 mL/min    Comment: (NOTE) Calculated using the CKD-EPI Creatinine Equation (2021)    Anion gap 7 5 - 15    Comment: Performed at [facility], [address] [place], [place] [address]  Resp panel by RT-PCR (RSV, Flu A&B, Covid) Anterior Nasal Swab     Status: None   Collection Time: 02/24/24  4:32 AM  Specimen: Anterior Nasal Swab  Result Value Ref Range   SARS Coronavirus 2 by RT PCR NEGATIVE NEGATIVE    Comment: (NOTE) SARS-CoV-2 target nucleic acids are NOT DETECTED.  The SARS-CoV-2 RNA is generally detectable in upper respiratory specimens during the acute phase of infection. The lowest concentration of SARS-CoV-2 viral copies this assay can  detect is 138 copies/mL. A negative result does not preclude SARS-Cov-2 infection and should not be used as the sole basis for treatment or other patient management decisions. A negative result may occur with  improper specimen collection/handling, submission of specimen other than nasopharyngeal swab, presence of viral mutation(s) within the areas targeted by this assay, and inadequate number of viral copies(<138 copies/mL). A negative result must be combined with clinical observations, patient history, and epidemiological information. The expected result is Negative.  Fact Sheet for Patients:  bloggercourse.com  Fact Sheet for Healthcare Providers:  seriousbroker.it  This test is no t yet approved or cleared by the [place]  FDA and  has been authorized for detection and/or diagnosis of SARS-CoV-2 by FDA under an Emergency Use Authorization (EUA). This EUA will remain  in effect (meaning this test can be used) for the duration of the COVID-19 declaration under Section 564(b)(1) of the Act, 21 U.S.C.section 360bbb-3(b)(1), unless the authorization is terminated  or revoked sooner.       Influenza A by PCR NEGATIVE NEGATIVE   Influenza B by PCR NEGATIVE NEGATIVE    Comment: (NOTE) The Xpert Xpress SARS-CoV-2/FLU/RSV plus assay is intended as an aid in the diagnosis of influenza from Nasopharyngeal swab specimens and should not be used as a sole basis for treatment. Nasal washings and aspirates are unacceptable for Xpert Xpress SARS-CoV-2/FLU/RSV testing.  Fact Sheet for Patients: bloggercourse.com  Fact Sheet for Healthcare Providers: seriousbroker.it  This test is not yet approved or cleared by the [place]  FDA and has been authorized for detection and/or diagnosis of SARS-CoV-2 by FDA under an Emergency Use Authorization (EUA). This EUA will remain in effect  (meaning this test can be used) for the duration of the COVID-19 declaration under Section 564(b)(1) of the Act, 21 U.S.C. section 360bbb-3(b)(1), unless the authorization is terminated or revoked.     Resp Syncytial Virus by PCR NEGATIVE NEGATIVE    Comment: (NOTE) Fact Sheet for Patients: bloggercourse.com  Fact Sheet for Healthcare Providers: seriousbroker.it  This test is not yet approved or cleared by the [place]  FDA and has been authorized for detection and/or diagnosis of SARS-CoV-2 by FDA under an Emergency Use Authorization (EUA). This EUA will remain in effect (meaning this test can be used) for the duration of the COVID-19 declaration under Section 564(b)(1) of the Act, 21 U.S.C. section 360bbb-3(b)(1), unless the authorization is terminated or revoked.  Performed at [facility], [address] [place], [place] [address]   Procalcitonin     Status: None   Collection Time: 02/24/24  4:32 AM  Result Value Ref Range   Procalcitonin <0.10 ng/mL    Comment:        Interpretation: PCT (Procalcitonin) <= 0.5 ng/mL: Systemic infection (sepsis) is not likely. Local bacterial infection is possible. (NOTE)       Sepsis PCT Algorithm           Lower Respiratory Tract  Infection PCT Algorithm    ----------------------------     ----------------------------         PCT < 0.25 ng/mL                PCT < 0.10 ng/mL          Strongly encourage             Strongly discourage   discontinuation of antibiotics    initiation of antibiotics    ----------------------------     -----------------------------       PCT 0.25 - 0.50 ng/mL            PCT 0.10 - 0.25 ng/mL               OR       >80% decrease in PCT            Discourage initiation of                                            antibiotics      Encourage discontinuation           of antibiotics     ----------------------------     -----------------------------         PCT >= 0.50 ng/mL              PCT 0.26 - 0.50 ng/mL               AND        <80% decrease in PCT             Encourage initiation of                                             antibiotics       Encourage continuation           of antibiotics    ----------------------------     -----------------------------        PCT >= 0.50 ng/mL                  PCT > 0.50 ng/mL               AND         increase in PCT                  Strongly encourage                                      initiation of antibiotics    Strongly encourage escalation           of antibiotics                                     -----------------------------                                           PCT <= 0.25 ng/mL  OR                                        > 80% decrease in PCT                                      Discontinue / Do not initiate                                             antibiotics  Performed at [facility], [address] [place] [place], [place] [address]     Radiology: DG Chest Portable 1 View Result Date: 02/24/2024 EXAM: 1 VIEW(S) XRAY OF THE CHEST 02/24/2024 04:52:00 AM COMPARISON: PA and lateral radiographs of the chest dated 08/13/2023. CLINICAL HISTORY: cough FINDINGS: LUNGS AND PLEURA: Bibasilar reticular nodularity which are more pronounced on the left than appear chronic and unchanged. No pulmonary edema. No pleural effusion. No pneumothorax. HEART AND MEDIASTINUM: No acute abnormality of the cardiac and mediastinal silhouettes. BONES AND SOFT TISSUES: No acute osseous abnormality. IMPRESSION: 1. No acute findings. 2. Bibasilar reticular nodularity, more pronounced on the left, appears chronic and unchanged. Electronically signed by: Evalene Coho MD 02/24/2024 05:04 AM EST RP Workstation: [id number]    No results found.  DG Chest Portable 1 View Result Date:  02/24/2024 EXAM: 1 VIEW(S) XRAY OF THE CHEST 02/24/2024 04:52:00 AM COMPARISON: PA and lateral radiographs of the chest dated 08/13/2023. CLINICAL HISTORY: cough FINDINGS: LUNGS AND PLEURA: Bibasilar reticular nodularity which are more pronounced on the left than appear chronic and unchanged. No pulmonary edema. No pleural effusion. No pneumothorax. HEART AND MEDIASTINUM: No acute abnormality of the cardiac and mediastinal silhouettes. BONES AND SOFT TISSUES: No acute osseous abnormality. IMPRESSION: 1. No acute findings. 2. Bibasilar reticular nodularity, more pronounced on the left, appears chronic and unchanged. 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   Type 2 diabetes mellitus without complication, without long-term current use of insulin  (HCC) 01/16/2023   OSA (obstructive sleep apnea) 01/16/2023   Fibromyalgia 03/31/2021   Headache disorder 12/09/2019   Allergic rhinitis, seasonal 07/14/2019   Urethra, 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   Polycythemia 07/02/2019   Seizures (HCC) 01/15/2018   HTN (hypertension) 12/29/2016   COPD (chronic obstructive pulmonary disease) (HCC) 12/29/2016   Alcohol use disorder in remission 12/27/2016   Multiple duodenal ulcers 01/24/2016   Neurofibromatosis, 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 OSA (obstructive sleep apnea) (Primary) The patient has not been able to  tolerate PAP  recently due to cough. She is dealing with achalasia/esophageal issues and anticipates a surgical intervention. We discussed the risks of not using her cpap and her apnea is overall mild, so I think she can safely hold off on use until her cough resolves, but I did encourage her to get back on therapy as soon as possible. The patient was reminded how to clean equipment and advised to replace supplies routinely. The patient was also counselled on weight loss. The compliance is poor. The AHI is 4.2.   OSA- resume use when able. F/u one year.   2. CPAP use counseling CPAP Counseling: had a lengthy discussion with the patient regarding the importance of PAP therapy in management of the sleep apnea. Patient appears to understand the risk factor reduction and also understands the risks associated with untreated sleep apnea. Patient will try to make a good faith effort to remain compliant with therapy. Also instructed the patient on proper cleaning of the device including the water must be changed daily if possible and use of distilled water is preferred. Patient understands that the machine should be regularly cleaned with appropriate recommended cleaning solutions that do not damage the PAP machine for example given white vinegar and water rinses. Other methods such as ozone treatment may not be as good as these simple methods to achieve cleaning.     General Counseling: I have discussed the findings of the evaluation and examination with Jenna Tyler.  I have also discussed any further diagnostic evaluation thatmay be needed or ordered today. Jenna Tyler verbalizes understanding of the  findings of todays visit. We also reviewed her medications today and discussed drug interactions and side effects including but not limited excessive drowsiness and altered mental states. We also discussed that there is always a risk not just to her but also people around her. she has been encouraged to call the office with any questions or concerns that should arise related to todays visit.  No orders of the defined types were placed in this encounter.       I have personally obtained a history, examined the patient, evaluated laboratory and imaging results, formulated the assessment and plan and placed orders. This patient was seen today by Lauraine Lay, PA-C in collaboration with Dr. Elfreda Bathe.   Elfreda DELENA Bathe, MD [facility] Diplomate ABMS Pulmonary Critical Care Medicine and Sleep Medicine

## 2024-03-02 ENCOUNTER — Ambulatory Visit: Admitting: Internal Medicine

## 2024-03-02 ENCOUNTER — Telehealth: Payer: Self-pay | Admitting: Family Medicine

## 2024-03-02 VITALS — BP 113/81 | HR 77 | Resp 12 | Ht 65.0 in | Wt 184.2 lb

## 2024-03-02 DIAGNOSIS — Z7189 Other specified counseling: Secondary | ICD-10-CM

## 2024-03-02 DIAGNOSIS — G4733 Obstructive sleep apnea (adult) (pediatric): Secondary | ICD-10-CM | POA: Diagnosis not present

## 2024-03-02 NOTE — Telephone Encounter (Unsigned)
 Copied from CRM #[id number]. Topic: General - Other >> Feb 28, 2024  3:36 PM Fonda T wrote: Reason for CRM: Patient calling, inquiring if primary care provider's office is providing assistance with SNAP benefits, and food assistance.   Patient can be reached at [phone] to advise or discuss further. Per patient ok respond via patient portal as well.   Patient is aware of next business day call back.

## 2024-03-02 NOTE — Patient Instructions (Signed)

## 2024-03-03 ENCOUNTER — Other Ambulatory Visit: Payer: Self-pay | Admitting: Nurse Practitioner

## 2024-03-03 ENCOUNTER — Other Ambulatory Visit: Payer: Self-pay

## 2024-03-03 ENCOUNTER — Encounter: Payer: Self-pay | Admitting: Family Medicine

## 2024-03-03 DIAGNOSIS — E039 Hypothyroidism, unspecified: Secondary | ICD-10-CM

## 2024-03-03 NOTE — Telephone Encounter (Signed)
Tried to call, sent my chart message.

## 2024-03-05 ENCOUNTER — Encounter: Payer: Self-pay | Admitting: Family Medicine

## 2024-03-09 ENCOUNTER — Encounter: Payer: Self-pay | Admitting: Family Medicine

## 2024-03-16 ENCOUNTER — Encounter: Payer: Self-pay | Admitting: Nurse Practitioner

## 2024-03-17 ENCOUNTER — Telehealth: Payer: Self-pay

## 2024-03-17 NOTE — Telephone Encounter (Signed)
 Copied from CRM [id number]. Topic: General - Other >> Mar 17, 2024 12:39 PM Nathanel BROCKS wrote: Reason for CRM: pt is calling to see if you got order for aeroflow, please advise.

## 2024-03-17 NOTE — Telephone Encounter (Signed)
 No forms received

## 2024-03-18 ENCOUNTER — Ambulatory Visit: Admitting: Nurse Practitioner

## 2024-03-23 ENCOUNTER — Telehealth: Payer: Self-pay | Admitting: Family Medicine

## 2024-03-23 ENCOUNTER — Encounter: Payer: Self-pay | Admitting: Nurse Practitioner

## 2024-03-23 NOTE — Telephone Encounter (Unsigned)
 Copied from CRM #[id number]. Topic: Clinical - Medication Refill >> Mar 23, 2024 11:10 AM Alexandria E wrote: Medication: methocarbamol  (ROBAXIN ) 750 MG tablet  Has the patient contacted their pharmacy? Yes (Agent: If no, request that the patient contact the pharmacy for the refill. If patient does not wish to contact the pharmacy document the reason why and proceed with request.) (Agent: If yes, when and what did the pharmacy advise?)  This is the patient's preferred pharmacy:    [id number] [address] [facility] - [address] [place] [address] Phone: [phone] Fax: [phone]    Is this the correct pharmacy for this prescription? Yes If no, delete pharmacy and type the correct one.   Has the prescription been filled recently? No  Is the patient out of the medication? Yes  Has the patient been seen for an appointment in the last year OR does the patient have an upcoming appointment? Yes  Can we respond through MyChart? Yes  Agent: Please be advised that Rx refills may take up to 3 business days. We ask that you follow-up with your pharmacy.

## 2024-03-24 ENCOUNTER — Telehealth: Payer: Self-pay | Admitting: Pharmacy Technician

## 2024-03-24 ENCOUNTER — Other Ambulatory Visit (HOSPITAL_COMMUNITY): Payer: Self-pay

## 2024-03-24 MED ORDER — METHOCARBAMOL 750 MG PO TABS: 750.0000 mg | ORAL_TABLET | Freq: Two times a day (BID) | ORAL | 0 refills | Status: AC

## 2024-03-24 NOTE — Telephone Encounter (Signed)
 Pharmacy Patient Advocate Encounter   Received notification from Onbase that prior authorization for Methocarbamol  750 mg tablet is required/requested.   Insurance verification completed.   The patient is insured through [employer].   Per test claim: The current 30 day co-pay is, $4.00.  No PA needed at this time. This test claim was processed through [facility]- copay amounts may vary at other pharmacies due to pharmacy/plan contracts, or as the patient moves through the different stages of their insurance plan.    **No PA needed at this time the prescription was written for #180 which is 3 months supply the insurance will only cover #60 a 1 month supply each fill**    Nothing further is needed

## 2024-03-24 NOTE — Progress Notes (Signed)
 Clinic Follow-up Note   Patient ID: Jenna Tyler is a 58 y.o. female  Date of Visit: 03/24/2024  PCP: Leavy Mole, PA  HPI: 58 y.o. 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  Previously followed with gastroenterology in [facility] up until 2023.  Longstanding GI history especially with abdominal pain  After the patient's tracheoplasty in March she developed difficulty swallowing solids and liquids.  She has had recurrent aspiration pneumonia with coughing while eating.  She denies any issues with her saliva.  She is currently on twice a day omeprazole  with good reflux control.  No nausea or vomiting.  Weight and appetite are stable.  She reports that her abdominal discomfort has resolved since being seen in the emergency department.  CT did not demonstrate pancreatitis.  MRI did demonstrate pancreatic atrophy but no biliary or pancreatic dilation.  Hepatic hemangioma was also appreciated.  Bowels are moving regularly without melena or hematochezia.  Status post cholecystectomy Ibuprofen 800 mg a couple times a week. Denies Anti-plt agents, and anticoagulants Denies family history of gastrointestinal disease and malignancy Previous Endoscopies: EGD 12/11/2023 dilation with 52 French Maloney.  Biopsies negative for EOE and for H. pylori  01/20/2024 esophageal motility manometry at [facility] Findings:     The high resolution manometry catheter with 36 circumferential sensors     on 1cm spacing was introduced transnasally after application of topical     anesthesia to the nasal passage. The catheter was then carefully     advanced to the stomach. The catheter was positioned  so that at least 2     distal sensors were in the stomach and 2 proximal sensors were located     above the upper esophageal sphincter. A brief acclimation period was     provided, followed by wet swallows of 5 mL water.     LOWER ESOPHAGEAL SPHINCTER (LES):     - Proximal LES border: 44.8 cm from nares.     - Distal LES border: 47.3 cm from nares.     - Pressure Inversion Point: 45.5 cm from nares.     - Basal / Resting LES pressure: 57.2 mmHg (respiratory mean, normal     range 13-43 mmHg).     - Residual LES pressure (Integrated Relaxation Pressure): 24.7 mmHg     (integrated relaxation, normal < 15 mmHg).     ESOPHAGEAL BODY (motility, peristaltic contractions):     - Number of swallows evaluated: 10. SUPINE     - Peristaltic: 100 %.     - Normal (effective esophageal peristalsis): 90 %.     - Hypotensive: 10 %.     - Hypertensive: 0 %.     - Simultaneous: 0 %.     - Failed: 0 %.     - Distal contractile integral (mean): 2592.6 mmHg-cm-sec.     - Distal contractile integral (highest): 3059.4 mmHg-cm-sec.     ESOPHAGEAL BODY (motility, peristaltic contractions):     - Number of swallows evaluated: 5. UPRIGHT     - Peristaltic: 100 %.     - Normal (effective esophageal peristalsis): 100 %.     - Hypotensive: 0 %.     - Hypertensive: 0 %.     - Simultaneous: 0 %.     -  Failed: 0 %.     - Distal contractile integral (mean): 1209.2 mmHg-cm-sec.     - Median IRP 40.6 mmHg ( elevated)     UPPER ESOPHAGEAL SPHINCTER (UES):     - UES location: 19.5 cm from nares.     - Basal / Resting UES pressure: 82.8 mmHg (mean, normal range 34-104     mmHg).     - Residual UES pressure: 7.3 mmHg (mean, normal < 12 mmHg).     ESOPHAGEAL BOLUS CLEARANCE:     - Incomplete bolus clearance: 90% (normal < 20%)                                                                               Impression:            - Hypertensive lower esophageal sphincter.                       - Esophagogastric junction  (EGJ) outflow obstruction.                       per current guidelines a secondary confirmatory study                       is recommended. if it has not already been done,                       please consider a barium wtih tablet, timed barium or                       endoflip.                       - Poor liquid esophageal transit                       - of note, the study was very challenging to analyze                       as there was a large extra band of contractility at                       the level of the mid esophagus. this made it hard to                       follow the swallow and determine DCI. this band was                       more prominent in the supine position.     EGD and colonoscopy in 2017 with duodenitis.  Biopsies negative for H. pylori   Interval History since last visit: Patient continues to have issues swallowing.  Repeat EGD with dilation in August did not improve symptoms.  She underwent motility manometry at  [facility] on 01/20/2024 as above which recommended Endoflip follow-up. She has since been referred to [facility] with appointment scheduled for February. She continues to have issues swallowing and has altered her diet to  accommodate for this.  She did have episode where she had significant chest discomfort after eating steak potato soup.  She was recommended to go to the emergency department at that time, but she did not and the feeling eventually passed.  Still taking Robaxin .  Bowels moving regularly without melena or hematochezia.  Patient denies nausea, coffee ground emesis, hematemesis, abdominal pain, diarrhea, constipation, melena, hematochezia, fever, chills, odynophagia, jaundice, heartburn/reflux, abnormal weight loss, fever, chills, night sweats.  Past Medical History:  Diagnosis Date   Alcohol abuse    Arthritis Not sure   Asthma (HHS-HCC)    Awareness under anesthesia    during appendectomy, could hear but unable to wake up, per pt.    Bronchitis, chronic (CMS/HHS-HCC)    Chicken pox    COPD (chronic obstructive pulmonary disease) (CMS/HHS-HCC)    Diabetes mellitus (CMS/HHS-HCC)    Esophageal dysmotility    GERD (gastroesophageal reflux disease)    Headache, unspecified headache type    History of pneumonia    HTN (hypertension) 12/29/2016   Hyperlipidemia    Muscle spasm    Obesity    Pleurisy    recurrent   Pneumonia    PONV (postoperative nausea and vomiting)    Pulmonary nodule    Sleep apnea 2023   Thyroid  disease    Tracheal/bronchial disease      Past Surgical History:  Procedure Laterality Date   HYSTERECTOMY AND OOPHORECTOMY  2001   CESAREAN SECTION  2001   twins   CHOLECYSTECTOMY  2002   THYROID  NODULE  2005   ERCP  2006   EPIGASTRIC HERNIA REPAIR  2011   stomach lipoids removed  2013   SMALL INTESTINE ENDOSCOPY W/BIOPSY  2016   tumor removed   APPENDECTOMY  04/11/2020   Dr Henriette Pierre --  ROBOTIC   BRONCHOSCOPY FLEXIBLE Bilateral 11/27/2022   Procedure: BRONCHOSCOPY, FLEXIBLE, INCLUDING FLUOROSCOPIC GUIDANCE, WHEN PERFORMED; DIAGNOSTIC, WITH CELL WASHING, WHEN PERFORMED (SEPARATE PROCEDURE);  Surgeon: Bjorn Signe Smart, MD;  Location[facility];  Service: Pulmonary;  Laterality: Bilateral;   BRONCHOSCOPY W/TRACHEAL DILITATION & STENT RIGID Bilateral 01/10/2023   Procedure: BRONCHOSCOPY, RIGID, INCLUDING FLUOROSCOPIC GUIDANCE, WHEN PERFORMED; WITH PLACEMENT OF TRACHEAL STENT(S) (INCLUDES TRACHEAL/BRONCHIAL DILATION AS REQUIRED);  Surgeon: Bjorn Signe Smart, MD;  Location: [facility];  Service: Pulmonary;  Laterality: Bilateral;   BRONCHOSCOPY Bilateral 01/10/2023   Procedure: BRONCHOSCOPY, RIGID OR FLEXIBLE, INCLUDING FLUOROSCOPIC GUIDANCE, WHEN PERFORMED; WITH PLACEMENT OF BRONCHIAL STENT(S) (INCLUDES TRACHEAL/BRONCHIAL DILATION AS REQUIRED), INITIAL BRONCHUS;  Surgeon: Bjorn Signe Smart, MD;   Location: [facility];  Service: Pulmonary;  Laterality: Bilateral;   BRONCHOSCOPY Bilateral 01/10/2023   Procedure: BRONCHOSCOPY, RIGID OR FLEXIBLE, INCLUDING FLUOROSCOPIC GUIDANCE, WHEN PERFORMED; EACH ADDITIONAL MAJOR BRONCHUS STENTED (LIST IN ADDITION TO PRIMARY PROCEDURE);  Surgeon: Bjorn Signe Smart, MD;  Location: [facility];  Service: Pulmonary;  Laterality: Bilateral;   BRONCHOSCOPY Bilateral 01/18/2023   Procedure: BRONCHOSCOPY, RIGID OR FLEXIBLE, INCL FLUOROSCOPIC GUIDANCE, WHEN PERFORMED; WITH REVISION TRACHEAL OR BRONCHIAL STENT INSERTED AT PREVIOUS SESSION (INCLUDES TRACHEAL/BRONCHIAL DILATION AS REQUIRED);  Surgeon: Garnell Qualia, Cristina Dalinda, MD;  Location: [facility];  Service: Pulmonary;  Laterality: Bilateral;   BRONCHOSCOPY W/REMOVAL FOREIGN BODY RIGID Bilateral 01/18/2023   Procedure: BRONCHOSCOPY, RIGID, INCLUDING FLUOROSCOPIC GUIDANCE, WHEN PERFORMED; WITH REMOVAL OF FOREIGN BODY;  Surgeon: Garnell Qualia, Lela Corns, MD;  Location: [facility];  Service: Pulmonary;  Laterality: Bilateral;   TRACHEOPLASTY INTRATHORACIC Right 07/17/2023   Procedure: **Robot-Assisted** TRACHEOPLASTY; INTRATHORACIC;  Surgeon:  Elmo Donnice Pinch, MD;  Location: [facility];  Service: Cardiothoracic;  Laterality: Right;  Robotic approach. Exparel  intercostal blocks   EGD@[facility]   12/02/2023   NML/Rpt69yrs/SMR   ESOPHAGEAL MOTILITY STUDY W/INT & REP  01/20/2024   HYSTERECTOMY     TAH    THYROIDECTOMY TOTAL     PARTIAL   TOOTH EXTRACTION     wisdom teeth x4    Allergies  Allergen Reactions   Nsaids (Non-Steroidal Anti-Inflammatory Drug) Other (See Comments)    Internal bleeding  Intestinal bleeding Other reaction(s): GI Bleed   Nirmatrelvir-Ritonavir Other (See Comments)   Statins-Hmg-Coa Reductase Inhibitors Other (See Comments)   Tizanidine  Other (See Comments)    Visual hallucinations   Adhesive  Tape-Silicones Rash    Paper Tape is tolerated   Aspirin Rash   Penicillin Rash    Family History  Problem Relation Name Age of Onset   High blood pressure (Hypertension) Mother Graylin    Macular degeneration Mother Graylin    Skin cancer Mother Graylin    Myocardial Infarction (Heart attack) Father Nancyann Gaskins    High blood pressure (Hypertension) Father Nancyann Gaskins    Coronary Artery Disease (Blocked arteries around heart) Father Nancyann Gaskins    Breast cancer Sister Verneita    Liver cancer Sister Verneita    Lung cancer Sister Verneita    Prostate cancer Brother Garrel    High blood pressure (Hypertension) Maternal Grandmother Verneita dew, Margie    Stroke Maternal Grandmother [place], [place]        TIA   Breast cancer Maternal Grandmother Verneita dew, Margie    No Known Problems Maternal Grandfather     High blood pressure (Hypertension) Paternal Grandmother Verlie    Diabetes Paternal Grandmother Verlie    Myocardial Infarction (Heart attack) Paternal Grandfather     Coronary Artery Disease (Blocked arteries around heart) Paternal Uncle Donald    Anesthesia problems Neg Hx      Sister- liver ca Patient denies other family history of GI Disease or malignancy, inflammatory bowel disease, or solid organ transplantation  Social History   Tobacco Use   Smoking status: Former    Current packs/day: 0.00    Average packs/day: 1 pack/day for 17.1 years (17.1 ttl pk-yrs)    Types: Cigarettes    Start date: 08/03/1980    Quit date: 08/31/1997    Years since quitting: 26.5   Smokeless tobacco: Never   Tobacco comments:    N/A  Vaping Use   Vaping status: Never Used  Substance Use Topics   Alcohol use: Not Currently    Comment: 6 1/2 years sober - attends [employer] weekly   Drug use: No    Comment: N/A      Review of Systems  Constitutional:  Negative for activity change, appetite change, chills, fatigue, fever and unexpected weight  change.  HENT:  Positive for trouble swallowing. Negative for voice change.   Respiratory:  Positive for cough and shortness of breath. Negative for wheezing.   Cardiovascular:  Negative for chest pain, palpitations and leg swelling.  Gastrointestinal:  Negative for abdominal distention, abdominal pain, anal bleeding, blood in stool, constipation, diarrhea, nausea, rectal pain and vomiting.  Genitourinary:  Negative for dysuria.  Musculoskeletal:  Negative for arthralgias and myalgias.  Skin:  Negative for color change and pallor.  Allergic/Immunologic: Negative for food allergies.  Neurological:  Negative for dizziness, syncope, weakness and light-headedness.  Psychiatric/Behavioral:  Negative for agitation and confusion.   All other systems reviewed and  are negative.    Medications Current Outpatient Medications on File Prior to Visit  Medication Sig Dispense Refill   albuterol  90 mcg/actuation inhaler Inhale 2 inhalations into the lungs every 6 (six) hours as needed for Wheezing or Shortness of Breath 18 g 0   blood glucose diagnostic (ACCU-CHEK GUIDE TEST STRIPS) test strip 1 each (1 strip total) once daily Use as instructed. 100 each 12   blood-glucose meter (ACCU-CHEK GUIDE GLUCOSE METER) Misc 1 each as directed 1 each 0   budesonide  (PULMICORT ) 0.25 mg/2 mL nebulizer solution Take 2 mLs (0.25 mg total) by nebulization 2 (two) times daily 120 mL 11   butalbital -acetaminophen -caffeine  (FIORICET ) 50-325-40 mg tablet TAKE ONE TABLET BY MOUTH AT HEADCACHE ONSET. CAN REPEAT ONCE IN 4 HOURS IF NEEDED. NO MORE THAN 2 IN 24 HRS. NO MORE THAN 4 IN A WEEK 15 tablet 0   chlorpheniramine (CHLOR-TRIMETON) 12 mg ER tablet Take 1 tablet (12 mg total) by mouth every 12 (twelve) hours 60 tablet 1   doxepin  (SINEQUAN ) 25 MG capsule Take 1 capsule (25 mg total) by mouth at bedtime 30 capsule 5   gabapentin  (NEURONTIN ) 800 MG tablet TAKE ONE TABLET EVERY MORNING AND EVERY EVENING AND TAKE TWO  TABLETS AT NIGHT 120 tablet 3   galcanezumab-gnlm 120 mg/mL PnIj Inject 1 mL subcutaneously monthly 1 mL 3   ibuprofen (MOTRIN) 200 MG tablet Take 800 mg by mouth every 8 (eight) hours as needed for Pain     lactase (LACTAID ORAL) Take 1 tablet by mouth as needed (when consuming dairy products)     methocarbamoL  (ROBAXIN ) 750 MG tablet TAKE 1 TABLET BY MOUTH THREE TIMES DAILY 90 tablet 3   nortriptyline (PAMELOR) 10 MG capsule Take  10 mg nightly for one week, then increase to 20 mg nightly 60 capsule 1   ondansetron  (ZOFRAN -ODT) 4 MG disintegrating tablet Take 1 tablet (4 mg total) by mouth every 6 (six) hours as needed for Nausea 30 tablet 2   polyethylene glycol (MIRALAX ) packet Take 17 g by mouth once daily     rOPINIRole  (REQUIP  XL) 2 MG XL tablet Take 2 mg by mouth at bedtime as needed     albuterol  (PROVENTIL ) 2.5 mg /3 mL (0.083 %) nebulizer solution Take 3 mLs (2.5 mg total) by nebulization 3 (three) times daily for 30 days 270 mL 0   brompheniramine -pseudoephed-DM (BROMFED DM) 2-30-10 mg/5 mL syrup Take 5 mLs by mouth every 6 (six) hours as needed (Patient not taking: Reported on 03/05/2024) 118 mL 0   dicyclomine  (BENTYL ) 20 mg tablet Take 20 mg by mouth as needed (Patient not taking: Reported on 03/05/2024)     fluticasone  propionate (FLONASE) 50 mcg/actuation nasal spray Place 1 spray into both nostrils 2 (two) times daily 16 g 11   HYDROcodone -chlorpheniramine (TUSSIONEX) 10-8 mg/5 mL ER suspension Take 5 mLs by mouth every 12 (twelve) hours as needed for Cough (Patient not taking: Reported on 03/24/2024)     lancets Use 1 each once daily Use as instructed. 100 each 12   levothyroxine  (SYNTHROID ) 100 MCG tablet Take 1 tablet (100 mcg total) by mouth once daily Take on an empty stomach with a glass of water at least 30-60 minutes before breakfast. 90 tablet 1   metoprolol  tartrate (LOPRESSOR ) 25 MG tablet Take 1 tablet (25 mg total) by mouth once daily 90 tablet 3   sod  sulf-pot chloride-mag sulf 1.479-0.188- 0.225 gram Tab Take 24 tablets by mouth as directed (Patient not  taking: Reported on 03/05/2024) 24 tablet 0   No current facility-administered medications on file prior to visit.    Pertinent GI related medications were reviewed with the patient  Objective  Vitals:   03/24/24 1131  BP: 125/86  BP Location: Left upper arm  Pulse: 101  Temp: 36.9 C (98.4 F)  TempSrc: Tympanic  Weight: 83.6 kg (184 lb 6.4 oz)  Height: 165.1 cm (5' 5)    Wt Readings from Last 3 Encounters:  03/24/24 83.6 kg (184 lb 6.4 oz)  03/05/24 83.5 kg (184 lb)  11/26/23 82.7 kg (182 lb 6.4 oz)    Body mass index is 30.69 kg/m.  Physical Exam Vitals and nursing note reviewed.  Constitutional:      General: She is not in acute distress.    Appearance: Normal appearance. She is not ill-appearing, toxic-appearing or diaphoretic.  HENT:     Head: Normocephalic and atraumatic.     Nose: Nose normal.     Mouth/Throat:     Mouth: Mucous membranes are moist.     Pharynx: Oropharynx is clear.  Eyes:     General: No scleral icterus.    Extraocular Movements: Extraocular movements intact.  Cardiovascular:     Rate and Rhythm: Normal rate and regular rhythm.     Heart sounds: Normal heart sounds. No murmur heard.    No friction rub. No gallop.  Pulmonary:     Effort: No respiratory distress.     Breath sounds: No wheezing, rhonchi or rales.  Abdominal:     General: Bowel sounds are normal. There is no distension.     Tenderness: There is no abdominal tenderness. There is no guarding (no rigidity, non peritoneal) or rebound.  Genitourinary:    Comments: Rectal exam deferred Musculoskeletal:     Cervical back: Neck supple.     Right lower leg: No edema.     Left lower leg: No edema.  Skin:    General: Skin is warm and dry.     Coloration: Skin is not jaundiced or pale.     Findings: No rash (on areas not covered by clothing).  Neurological:     General: No  focal deficit present.     Mental Status: She is alert and oriented to person, place, and time. Mental status is at baseline.  Psychiatric:        Mood and Affect: Mood normal.        Behavior: Behavior normal.        Thought Content: Thought content normal.        Judgment: Judgment normal.     Laboratory Data No recent pertinent labs  Imaging: 01/20/24 esophageal motility manometry as in HPI  12/24/23 CT abdomen pelvis with contrast FINDINGS:  Lower chest: Minimal bilateral posterior basilar subsegmental  atelectasis or scarring is noted.   Hepatobiliary: Stable right hepatic hemangioma. Status post  cholecystectomy. No biliary dilatation.   Pancreas: Unremarkable. No pancreatic ductal dilatation or  surrounding inflammatory changes.   Spleen: Normal in size without focal abnormality.   Adrenals/Urinary Tract: Adrenal glands are unremarkable. Kidneys are  normal, without renal calculi, focal lesion, or hydronephrosis.  Bladder is unremarkable.   Stomach/Bowel: Stomach is unremarkable. Status post appendectomy. No  evidence of bowel obstruction or inflammation.   Vascular/Lymphatic: No significant vascular findings are present. No  enlarged abdominal or pelvic lymph nodes.   Reproductive: Status post hysterectomy. No adnexal masses.   Other: No abdominal wall hernia or abnormality. No abdominopelvic  ascites.   Musculoskeletal: No acute or significant osseous findings.   IMPRESSION:  1. Stable right hepatic hemangioma.  2. No acute abnormality seen in the abdomen or pelvis.   10/03/23 esophagram FINDINGS:  Esophagus: Normal appearance.  No stricture or ulceration seen.   Esophageal motility: When the patient was drinking and right  anterior oblique positioning, following primary and secondary waves  there was persistent esophageal stasis with markedly delayed passage  of contrast from the esophagus into the stomach. Contrast remained  throughout the mid to  superior esophagus. After approximately 5  minutes table was tilted to around 20 degrees which minimally aided  the passage of contrast. Patient ultimately had to return to a full  upright position after 12 minutes for all the contrast to pass into  the stomach.   Gastroesophageal reflux: None visualized with coughing or Valsalva.   Ingested 13mm barium tablet: Passed without difficulty into the  stomach.   Stomach: Normal appearance. No hiatal hernia.   Gastric emptying: Normal.   Duodenum: Normal appearance.   Other:  Patient with frequent coughing throughout exam.   IMPRESSION:  Esophageal dysmotility with marked esophageal stasis. Contrast  remained within the esophagus with the patient lying prone until the  patient stood upright.   08/22/23 CT Chest without FINDINGS:  Cardiovascular: No significant vascular findings. Normal heart size.  No pericardial effusion.   Mediastinum/Nodes: No enlarged mediastinal or axillary lymph nodes.  Thyroid  gland, trachea, and esophagus demonstrate no significant  findings.   Lungs/Pleura: No pneumothorax or pleural effusion is noted. Minimal  bilateral posterior basilar subsegmental atelectasis is noted. Mild  left lingular subsegmental atelectasis or possibly infiltrate is  noted. Calcified granuloma is noted in superior segment of left  lower lobe.   Upper Abdomen: No acute abnormality.   Musculoskeletal: No chest wall mass or suspicious bone lesions  identified.   IMPRESSION:  Mild left lingular subsegmental atelectasis or possibly infiltrate  is noted. Minimal bilateral posterior basilar subsegmental  atelectasis is noted.    Assessment:   1. Esophageal dysphagia -Continued difficulty with solids and liquids.  No issues with pills.  Rare odynophagia  - Abnormal UGI, EGD, and M&M - scheduled to see esophageal motility specialist for possible endoflip  -Upper GI demonstrating dysmotility and stasis - extrinsic  compression on egd and no response to dilation - bx negative for eoe   2. 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 GERD without esophagitis -Overall controlled on omeprazole  40 mg twice a day   4. Abdominal pain, generalized -Longstanding history of abdominal pain.  This is improved per patient   5. Chronic cough Secondary to her tracheobronchomalacia   6. Chronic constipation Does well with as needed MiraLAX    7. Chronic pancreatitis, unspecified pancreatitis type (CMS/HHS-HCC) Pancreatic atrophy appreciated on MRI in February.  No pancreatic duct dilation.  No biliary dilation   8. Hepatic hemangioma Benign, appreciated on CT and MRI this year   9. S/P cholecystectomy   10. Neurofibromatosis, type 1 (CMS/HHS-HCC) -     Ambulatory Referral to Colonoscopy and Upper Endoscopy   11. Tracheobronchomalacia Status post tracheoplasty in March 2025  Plan:   Awaiting evaluation with [facility] esophageal motility specialist and possible Endoflip (currently office visit scheduled for February) Reviewed results with patient in the office from recent studies I have reached out to the [facility] provider in hopes of having patient seen sooner (coordination of care)  37 minutes were spent in this encounter including pre-visit review of records (including pertinent notes, labs, imaging), in office visit, and post visit documentation Thank you for allowing us  to participate in this patient's care. Please do not hesitate to call if any questions or concerns arise.    Attestation Statement:   I personally performed the service. (TP)  STEVEN MICHAEL RUSSO, DO   [facility]   Portions of the record may have been created with voice recognition software. Occasional wrong-word or 'sound-a-like' substitutions may have occurred due to the inherent limitations of voice recognition  software.  Read the chart carefully and recognize, using context, where substitutions may have occurred.
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
## 2024-04-02 ENCOUNTER — Encounter: Payer: Self-pay | Admitting: Emergency Medicine

## 2024-04-02 ENCOUNTER — Emergency Department
Admission: EM | Admit: 2024-04-02 | Discharge: 2024-04-02 | Disposition: A | Discharge status: Home / Self Care | Attending: Emergency Medicine | Admitting: Emergency Medicine

## 2024-04-02 ENCOUNTER — Other Ambulatory Visit: Payer: Self-pay

## 2024-04-02 ENCOUNTER — Emergency Department

## 2024-04-02 DIAGNOSIS — J449 Chronic obstructive pulmonary disease, unspecified: Secondary | ICD-10-CM | POA: Diagnosis not present

## 2024-04-02 DIAGNOSIS — E119 Type 2 diabetes mellitus without complications: Secondary | ICD-10-CM | POA: Diagnosis not present

## 2024-04-02 DIAGNOSIS — R490 Dysphonia: Secondary | ICD-10-CM | POA: Diagnosis present

## 2024-04-02 DIAGNOSIS — J029 Acute pharyngitis, unspecified: Secondary | ICD-10-CM | POA: Insufficient documentation

## 2024-04-02 LAB — COMPREHENSIVE METABOLIC PANEL WITH GFR
ALT: 21 U/L (ref 0–44)
AST: 20 U/L (ref 15–41)
Albumin: 4.2 g/dL (ref 3.5–5.0)
Alkaline Phosphatase: 124 U/L (ref 38–126)
Anion gap: 14 (ref 5–15)
BUN: 10 mg/dL (ref 6–20)
CO2: 23 mmol/L (ref 22–32)
Calcium: 9.5 mg/dL (ref 8.9–10.3)
Chloride: 101 mmol/L (ref 98–111)
Creatinine, Ser: 0.85 mg/dL (ref 0.44–1.00)
GFR, Estimated: >60 mL/min (ref >60)
Glucose, Bld: 109 mg/dL — ABNORMAL HIGH (ref 70–99)
Potassium: 4.6 mmol/L (ref 3.5–5.1)
Sodium: 138 mmol/L (ref 135–145)
Total Bilirubin: 0.3 mg/dL (ref 0.0–1.2)
Total Protein: 6.8 g/dL (ref 6.5–8.1)

## 2024-04-02 LAB — CBC WITH DIFFERENTIAL/PLATELET
Abs Immature Granulocytes: 0.03 K/uL (ref 0.00–0.07)
Basophils Absolute: 0.1 K/uL (ref 0.0–0.1)
Basophils Relative: 1 %
Eosinophils Absolute: 0.1 K/uL (ref 0.0–0.5)
Eosinophils Relative: 2 %
HCT: 47.5 % — ABNORMAL HIGH (ref 36.0–46.0)
Hemoglobin: 15.8 g/dL — ABNORMAL HIGH (ref 12.0–15.0)
Immature Granulocytes: 0 %
Lymphocytes Relative: 20 %
Lymphs Abs: 1.6 K/uL (ref 0.7–4.0)
MCH: 27.9 pg (ref 26.0–34.0)
MCHC: 33.3 g/dL (ref 30.0–36.0)
MCV: 83.8 fL (ref 80.0–100.0)
Monocytes Absolute: 0.5 K/uL (ref 0.1–1.0)
Monocytes Relative: 7 %
Neutro Abs: 5.5 K/uL (ref 1.7–7.7)
Neutrophils Relative %: 70 %
Platelets: 290 K/uL (ref 150–400)
RBC: 5.67 MIL/uL — ABNORMAL HIGH (ref 3.87–5.11)
RDW: 14.1 % (ref 11.5–15.5)
WBC: 7.8 K/uL (ref 4.0–10.5)
nRBC: 0 % (ref 0.0–0.2)

## 2024-04-02 LAB — RESP PANEL BY RT-PCR (RSV, FLU A&B, COVID)  RVPGX2
Influenza A by PCR: NEGATIVE
Influenza B by PCR: NEGATIVE
Resp Syncytial Virus by PCR: NEGATIVE
SARS Coronavirus 2 by RT PCR: NEGATIVE

## 2024-04-02 LAB — GROUP A STREP BY PCR: Group A Strep by PCR: NOT DETECTED

## 2024-04-02 MED ORDER — ALUM & MAG HYDROXIDE-SIMETH 200-200-20 MG/5ML PO SUSP
30.0000 mL | Freq: Once | ORAL | Status: AC
  Administered 2024-04-02: 30 mL via ORAL
  Filled 2024-04-02: qty 30

## 2024-04-02 MED ORDER — MORPHINE SULFATE (PF) 2 MG/ML IV SOLN
2.0000 mg | Freq: Once | INTRAVENOUS | Status: AC
  Administered 2024-04-02: 2 mg via INTRAVENOUS
  Filled 2024-04-02: qty 1

## 2024-04-02 MED ORDER — SODIUM CHLORIDE 0.9 % IV BOLUS
1000.0000 mL | Freq: Once | INTRAVENOUS | Status: AC
  Administered 2024-04-02: 1000 mL via INTRAVENOUS

## 2024-04-02 MED ORDER — IOHEXOL 300 MG/ML  SOLN
75.0000 mL | Freq: Once | INTRAMUSCULAR | Status: AC | PRN
  Administered 2024-04-02: 75 mL via INTRAVENOUS

## 2024-04-02 MED ORDER — DEXAMETHASONE SOD PHOSPHATE PF 10 MG/ML IJ SOLN
10.0000 mg | Freq: Once | INTRAMUSCULAR | Status: AC
  Administered 2024-04-02: 10 mg via INTRAVENOUS

## 2024-04-02 MED ORDER — LIDOCAINE VISCOUS HCL 2 % MT SOLN
15.0000 mL | Freq: Once | OROMUCOSAL | Status: AC
  Administered 2024-04-02: 15 mL via ORAL
  Filled 2024-04-02: qty 15

## 2024-04-02 NOTE — ED Provider Notes (Signed)
 Jenna Tyler Provider Note    Event Date/Time   First MD Initiated Contact with Patient 04/02/24 [phone]     (approximate)   History   Dysphagia and Neck Pain   HPI  Jenna Tyler is a 58 y.o. female with history of COPD, diabetes, fibromyalgia, esophageal dysmotility, GERD, presenting with hoarseness and neck pain.  States that she noticed hoarseness starting on Sunday, noticed some neck pain the day after that.  No fever or cough.  States that she is still able to maintain her secretions and swallow.  Denies any shortness of breath or chest pain.  No nausea vomiting or diarrhea.  Was able to take her pills this morning including her Protonix .  Denies immunosuppression.  Was sent in by GI Dr. Onita for further evaluation.  On independent chart review, she was seen by GI in early December, has history of difficulty swallowing swallow solids and liquids, had EGD with dilatation in August that did not improve symptoms, underwent motility manometry and was referred to [facility] for possible Endoflip.     Physical Exam   Triage Vital Signs: ED Triage Vitals  Encounter Vitals Group     BP 04/02/24 0909 (!) 156/101     Girls Systolic BP Percentile --      Girls Diastolic BP Percentile --      Boys Systolic BP Percentile --      Boys Diastolic BP Percentile --      Pulse Rate 04/02/24 0909 (!) 105     Resp 04/02/24 0909 18     Temp 04/02/24 0909 97.8 F (36.6 C)     Temp Source 04/02/24 0909 Oral     SpO2 04/02/24 0909 100 %     Weight 04/02/24 0907 182 lb (82.6 kg)     Height 04/02/24 0907 5' 5 (1.651 m)     Head Circumference --      Peak Flow --      Pain Score 04/02/24 0906 5     Pain Loc --      Pain Education --      Exclude from Growth Chart --     Most recent vital signs: Vitals:   04/02/24 0909  BP: (!) 156/101  Pulse: (!) 105  Resp: 18  Temp: 97.8 F (36.6 C)  SpO2: 100%     General: Awake, no distress.  CV:  Good peripheral perfusion.   Resp:  Normal effort.  No tachypnea or respiratory distress Abd:  No distention.  Soft nontender Other:  Oropharynx is clear, there is no tonsillar exudates, no unilateral tonsillar swelling, her voice is not muffled, it is slightly hoarse, she is able to maintain her secretions and swallow her saliva, able to fully range her neck, she has no obvious lymphadenopathy.   ED Results / Procedures / Treatments   Labs (all labs ordered are listed, but only abnormal results are displayed) Labs Reviewed  COMPREHENSIVE METABOLIC PANEL WITH GFR - Abnormal; Notable for the following components:      Result Value   Glucose, Bld 109 (*)    All other components within normal limits  CBC WITH DIFFERENTIAL/PLATELET - Abnormal; Notable for the following components:   RBC 5.67 (*)    Hemoglobin 15.8 (*)    HCT 47.5 (*)    All other components within normal limits  GROUP A STREP BY PCR  RESP PANEL BY RT-PCR (RSV, FLU A&B, COVID)  [id number]     RADIOLOGY On my  independent interpretation, CT without obvious abscess or mass   PROCEDURES:  Critical Care performed: No  Procedures   MEDICATIONS ORDERED IN ED: Medications  sodium chloride  0.9 % bolus 1,000 mL (1,000 mLs Intravenous New Bag/Given 04/02/24 1015)  dexamethasone  (DECADRON ) injection 10 mg (10 mg Intravenous Given 04/02/24 1014)  alum & mag hydroxide-simeth (MAALOX/MYLANTA) 200-200-20 MG/5ML suspension 30 mL (30 mLs Oral Given 04/02/24 1014)    And  lidocaine  (XYLOCAINE ) 2 % viscous mouth solution 15 mL (15 mLs Oral Given 04/02/24 1014)  morphine  (PF) 2 MG/ML injection 2 mg (2 mg Intravenous Given 04/02/24 1102)  iohexol  (OMNIPAQUE ) 300 MG/ML solution 75 mL (75 mLs Intravenous Contrast Given 04/02/24 1145)     IMPRESSION / MDM / ASSESSMENT AND PLAN / ED COURSE  I reviewed the triage vital signs and the nursing notes.                              Differential diagnosis includes, but is not limited to, did consider mass,  pharyngitis, esophageal dysmotility, acid reflux, viral illness, patient is not drooling, no muffled voice, able to range her neck, considered but doubt retropharyngeal abscess, peritonsillar abscess, tonsillitis, epiglottitis.  Will get labs, strep swab, viral swab, CT soft tissue neck.  Will give her a GI cocktail here, will also give her some Decadron .  Reassess.    Patient's presentation is most consistent with acute presentation with potential threat to life or bodily function.  Independent interpretation of labs and imaging below.  On reassessment patient is feeling a lot better, discussed with her about imaging and lab results.  CT shows possible pharyngitis.  Suspect this might be the cause of her symptoms.  She is maintaining her secretions, tolerating p.o., well-appearing.  Discussed with her about keeping herself hydrated.  Discussed about following up primary care doctor this week or early next week to get reassessed.  Otherwise considered but no indication for inpatient admission at this time, she safe for outpatient management.  Will discharge with strict return precautions.  Shared decision making done with patient and she is agreeable with this plan.    Clinical Course as of 04/02/24 1229  Thu Apr 02, 2024  1043 Group A Strep by PCR: NOT DETECTED [TT]  1150 Independent review of labs, electrolytes not severely deranged, creatinine is normal, LFTs are normal, no leukocytosis, respiratory viral panel is negative. [TT]  1223 CT Soft Tissue Neck W Contrast IMPRESSION: 1. Minimal edema of the oropharyngeal mucosa, possibly reflecting pharyngitis. 2. No tonsillar enlargement or peritonsillar abscess. 3. No cervical lymphadenopathy.   [TT]    Clinical Course User Index [TT] Waymond Lorelle Cummins, MD     FINAL CLINICAL IMPRESSION(S) / ED DIAGNOSES   Final diagnoses:  Pharyngitis, unspecified etiology  Sore throat  Hoarseness     Rx / DC Orders   ED Discharge Orders     None         Note:  This document was prepared using Dragon voice recognition software and may include unintentional dictation errors.    Waymond Lorelle Cummins, MD 04/02/24 [phone]

## 2024-04-02 NOTE — ED Triage Notes (Signed)
 Pt via POV from home. Pt c/o sore throat, neck pain, and hoarseness that started yesterday. Pt has had ongoing issues with her esophagus and has been seen already with GI but her follow up appointment isn't until February. Able to swallow saliva. Pt is A&Ox4 and NAD, ambulatory to triage.

## 2024-04-06 ENCOUNTER — Inpatient Hospital Stay: Admitting: Family Medicine

## 2024-04-08 ENCOUNTER — Encounter: Payer: Self-pay | Admitting: Nurse Practitioner

## 2024-04-24 ENCOUNTER — Encounter: Payer: Self-pay | Admitting: Nurse Practitioner

## 2024-04-24 ENCOUNTER — Ambulatory Visit (INDEPENDENT_AMBULATORY_CARE_PROVIDER_SITE_OTHER): Admitting: Nurse Practitioner

## 2024-04-24 VITALS — BP 122/88 | HR 102 | Temp 98.0°F | Ht 65.0 in | Wt 179.0 lb

## 2024-04-24 DIAGNOSIS — R051 Acute cough: Secondary | ICD-10-CM

## 2024-04-24 DIAGNOSIS — J40 Bronchitis, not specified as acute or chronic: Secondary | ICD-10-CM

## 2024-04-24 LAB — POC COVID19/FLU A&B COMBO
Covid Antigen, POC: NEGATIVE
Influenza A Antigen, POC: NEGATIVE
Influenza B Antigen, POC: NEGATIVE

## 2024-04-24 MED ORDER — HYDROCOD POLI-CHLORPHE POLI ER 10-8 MG/5ML PO SUER: 5.0000 mL | Freq: Two times a day (BID) | ORAL | 0 refills | Status: AC | PRN

## 2024-04-24 MED ORDER — ALBUTEROL SULFATE HFA 108 (90 BASE) MCG/ACT IN AERS: 2.0000 | INHALATION_SPRAY | Freq: Four times a day (QID) | RESPIRATORY_TRACT | 5 refills | Status: AC | PRN

## 2024-04-24 MED ORDER — PREDNISONE 10 MG (21) PO TBPK: ORAL_TABLET | ORAL | 0 refills | Status: AC

## 2024-04-24 NOTE — Progress Notes (Signed)
 "  BP 122/88   Pulse (!) 102   Temp 98 F (36.7 C)   Ht 5' 5 (1.651 m)   Wt 179 lb (81.2 kg)   SpO2 98%   BMI 29.79 kg/m    Subjective:    Patient ID: Jenna Tyler, female    DOB: 1966-01-12, 59 y.o.   MRN: [id number]  HPI: Jenna Tyler is a 59 y.o. female  Chief Complaint  Patient presents with   Cough    Pt c/o cough x4 days.    Discussed the use of AI scribe software for clinical note transcription with the patient, who gave verbal consent to proceed.  History of Present Illness Jenna Tyler is a 59 year old female with COPD and asthma who presents with worsening cough.  Cough and respiratory symptoms - Worsening cough for the past four days - Over-the-counter medications and Tessalon  Perles have not been effective - No fever - Shortness of breath present - Concerned about potentially exposing others to illness, especially after recent family events  Headache - Headaches present, not worse than usual  Covid-19 and influenza testing - Rapid COVID and flu tests negative  History of chronic pulmonary disease - COPD and asthma - Past use of albuterol  inhaler, currently does not have one at home - Previous episode of bronchitis requiring medical attention         02/17/2024    8:30 AM 09/24/2023    1:07 PM 07/15/2023    4:30 PM  Depression screen PHQ 2/9  Decreased Interest 0 0 0  Down, Depressed, Hopeless 0 0 0  PHQ - 2 Score 0 0 0  Altered sleeping 0  0  Tired, decreased energy 0  1  Change in appetite 0  0  Feeling bad or failure about yourself  0  0  Trouble concentrating 0  0  Moving slowly or fidgety/restless 0  0  Suicidal thoughts 0  0  PHQ-9 Score 0   1   Difficult doing work/chores Not difficult at all  Not difficult at all     Data saved with a previous flowsheet row definition    Relevant past medical, surgical, family and social history reviewed and updated as indicated. Interim medical history since our last visit  reviewed. Allergies and medications reviewed and updated.  Review of Systems  Ten systems reviewed and is negative except as mentioned in HPI      Objective:      BP 122/88   Pulse (!) 102   Temp 98 F (36.7 C)   Ht 5' 5 (1.651 m)   Wt 179 lb (81.2 kg)   SpO2 98%   BMI 29.79 kg/m    Wt Readings from Last 3 Encounters:  04/24/24 179 lb (81.2 kg)  04/02/24 182 lb (82.6 kg)  03/02/24 184 lb 3.2 oz (83.6 kg)    Physical Exam GENERAL: Alert, cooperative, well developed, no acute distress. HEENT: Normocephalic, normal oropharynx, moist mucous membranes. CHEST: Wheezing present, otherwise clear to auscultation bilaterally. CARDIOVASCULAR: Normal heart rate and rhythm, S1 and S2 normal without murmurs. ABDOMEN: Soft, non-tender, non-distended, without organomegaly, normal bowel sounds. EXTREMITIES: No cyanosis or edema. NEUROLOGICAL: Cranial nerves grossly intact, moves all extremities without gross motor or sensory deficit.  Results for orders placed or performed in visit on 04/24/24  POC Covid19/Flu A&B Antigen   Collection Time: 04/24/24  1:38 PM  Result Value Ref Range   Influenza A Antigen, POC Negative Negative   Influenza B Antigen,  POC Negative Negative   Covid Antigen, POC Negative Negative          Assessment & Plan:   Problem List Items Addressed This Visit   None Visit Diagnoses       Acute cough    -  Primary   Relevant Medications   chlorpheniramine-HYDROcodone  (TUSSIONEX) 10-8 MG/5ML   predniSONE  (STERAPRED UNI-PAK 21 TAB) 10 MG (21) TBPK tablet   albuterol  (VENTOLIN  HFA) 108 (90 Base) MCG/ACT inhaler   Other Relevant Orders   POC Covid19/Flu A&B Antigen (Completed)     Bronchitis       Relevant Medications   chlorpheniramine-HYDROcodone  (TUSSIONEX) 10-8 MG/5ML   predniSONE  (STERAPRED UNI-PAK 21 TAB) 10 MG (21) TBPK tablet   albuterol  (VENTOLIN  HFA) 108 (90 Base) MCG/ACT inhaler        Assessment and Plan Assessment & Plan Acute  bronchitis Cough worsening over four days, negative for COVID and flu. Wheezing present, no fever. Likely viral upper respiratory infection. - Prescribed prednisone  taper for inflammation - Prescribed Desenex for cough management  Chronic obstructive pulmonary disease (COPD) COPD with current exacerbation due to acute bronchitis. Wheezing present, no significant shortness of breath. - Prescribed albuterol  inhaler for bronchodilation  Asthma Current exacerbation likely due to acute bronchitis. Wheezing present, no significant shortness of breath. - Prescribed albuterol  inhaler for bronchodilation  Recommend taking zyrtec, flonase, mucinex , vitamin d , vitamin c, and zinc. Push fluids and get rest.         Follow up plan: Return if symptoms worsen or fail to improve. "

## 2024-04-28 ENCOUNTER — Encounter: Payer: Self-pay | Admitting: Nurse Practitioner

## 2024-05-04 ENCOUNTER — Encounter: Payer: Self-pay | Admitting: Nurse Practitioner

## 2024-05-20 ENCOUNTER — Encounter: Admitting: Family Medicine

## 2024-05-25 ENCOUNTER — Encounter: Payer: Self-pay | Admitting: Nurse Practitioner
